# Patient Record
Sex: Female | Born: 1940
Health system: Southern US, Community
[De-identification: ages and names within clinical notes are randomized; demographics above are authoritative.]

## PROBLEM LIST (undated history)

## (undated) DIAGNOSIS — G629 Polyneuropathy, unspecified: Secondary | ICD-10-CM

## (undated) DIAGNOSIS — J329 Chronic sinusitis, unspecified: Secondary | ICD-10-CM

## (undated) DIAGNOSIS — M1990 Unspecified osteoarthritis, unspecified site: Secondary | ICD-10-CM

## (undated) DIAGNOSIS — N08 Glomerular disorders in diseases classified elsewhere: Secondary | ICD-10-CM

## (undated) DIAGNOSIS — N390 Urinary tract infection, site not specified: Secondary | ICD-10-CM

## (undated) DIAGNOSIS — F32A Depression, unspecified: Secondary | ICD-10-CM

## (undated) DIAGNOSIS — J302 Other seasonal allergic rhinitis: Secondary | ICD-10-CM

## (undated) DIAGNOSIS — F419 Anxiety disorder, unspecified: Secondary | ICD-10-CM

## (undated) DIAGNOSIS — Z8673 Personal history of transient ischemic attack (TIA), and cerebral infarction without residual deficits: Secondary | ICD-10-CM

## (undated) DIAGNOSIS — I10 Essential (primary) hypertension: Secondary | ICD-10-CM

## (undated) DIAGNOSIS — F19951 Other psychoactive substance use, unspecified with psychoactive substance-induced psychotic disorder with hallucinations: Secondary | ICD-10-CM

## (undated) DIAGNOSIS — N183 Chronic kidney disease, stage 3 (moderate): Secondary | ICD-10-CM

## (undated) DIAGNOSIS — R110 Nausea: Secondary | ICD-10-CM

## (undated) DIAGNOSIS — R634 Abnormal weight loss: Secondary | ICD-10-CM

## (undated) DIAGNOSIS — J40 Bronchitis, not specified as acute or chronic: Secondary | ICD-10-CM

## (undated) DIAGNOSIS — R300 Dysuria: Secondary | ICD-10-CM

## (undated) DIAGNOSIS — B20 Human immunodeficiency virus [HIV] disease: Secondary | ICD-10-CM

## (undated) DIAGNOSIS — Z21 Asymptomatic human immunodeficiency virus [HIV] infection status: Secondary | ICD-10-CM

## (undated) DIAGNOSIS — T8859XA Other complications of anesthesia, initial encounter: Secondary | ICD-10-CM

## (undated) DIAGNOSIS — R945 Abnormal results of liver function studies: Secondary | ICD-10-CM

## (undated) DIAGNOSIS — F329 Major depressive disorder, single episode, unspecified: Secondary | ICD-10-CM

## (undated) DIAGNOSIS — J449 Chronic obstructive pulmonary disease, unspecified: Secondary | ICD-10-CM

## (undated) HISTORY — PX: BACK SURGERY: SHX140

## (undated) HISTORY — DX: Chronic kidney disease, stage 3 (moderate): N18.3

## (undated) HISTORY — PX: CHOLECYSTECTOMY: SHX55

## (undated) HISTORY — DX: Abnormal results of liver function studies: R94.5

## (undated) HISTORY — DX: Abnormal weight loss: R63.4

## (undated) HISTORY — DX: Urinary tract infection, site not specified: N39.0

## (undated) HISTORY — PX: TUBAL LIGATION: SHX77

## (undated) HISTORY — DX: Chronic sinusitis, unspecified: J32.9

## (undated) HISTORY — DX: Nausea: R11.0

## (undated) HISTORY — PX: CATARACT EXTRACTION, BILATERAL: SHX1313

---
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
## 1979-10-27 DIAGNOSIS — Z8673 Personal history of transient ischemic attack (TIA), and cerebral infarction without residual deficits: Secondary | ICD-10-CM

## 1979-10-27 HISTORY — DX: Personal history of transient ischemic attack (TIA), and cerebral infarction without residual deficits: Z86.73

## 1983-10-27 HISTORY — PX: ABDOMINAL HYSTERECTOMY: SHX81

## 1997-05-24 ENCOUNTER — Encounter (INDEPENDENT_AMBULATORY_CARE_PROVIDER_SITE_OTHER): Payer: Self-pay | Admitting: *Deleted

## 1997-05-24 LAB — CONVERTED CEMR LAB
CD4 Count: 33 microliters
CD4 T Cell Abs: 33

## 1999-01-22 ENCOUNTER — Ambulatory Visit (HOSPITAL_COMMUNITY): Admission: RE | Admit: 1999-01-22 | Discharge: 1999-01-22 | Payer: Self-pay | Admitting: Infectious Diseases

## 1999-01-22 ENCOUNTER — Encounter: Admission: RE | Admit: 1999-01-22 | Discharge: 1999-01-22 | Payer: Self-pay | Admitting: Infectious Diseases

## 1999-02-17 ENCOUNTER — Encounter: Admission: RE | Admit: 1999-02-17 | Discharge: 1999-02-17 | Payer: Self-pay | Admitting: Infectious Diseases

## 1999-04-21 ENCOUNTER — Encounter: Admission: RE | Admit: 1999-04-21 | Discharge: 1999-04-21 | Payer: Self-pay | Admitting: Infectious Diseases

## 1999-04-21 ENCOUNTER — Ambulatory Visit (HOSPITAL_COMMUNITY): Admission: RE | Admit: 1999-04-21 | Discharge: 1999-04-21 | Payer: Self-pay | Admitting: Infectious Diseases

## 1999-05-08 ENCOUNTER — Encounter: Payer: Self-pay | Admitting: Infectious Diseases

## 1999-05-08 ENCOUNTER — Ambulatory Visit (HOSPITAL_COMMUNITY): Admission: RE | Admit: 1999-05-08 | Discharge: 1999-05-08 | Payer: Self-pay | Admitting: Infectious Diseases

## 1999-06-02 ENCOUNTER — Encounter: Admission: RE | Admit: 1999-06-02 | Discharge: 1999-06-02 | Payer: Self-pay | Admitting: Infectious Diseases

## 1999-07-21 ENCOUNTER — Encounter: Admission: RE | Admit: 1999-07-21 | Discharge: 1999-07-21 | Payer: Self-pay | Admitting: Internal Medicine

## 1999-07-21 ENCOUNTER — Ambulatory Visit (HOSPITAL_COMMUNITY): Admission: RE | Admit: 1999-07-21 | Discharge: 1999-07-21 | Payer: Self-pay | Admitting: Infectious Diseases

## 1999-07-21 ENCOUNTER — Encounter: Admission: RE | Admit: 1999-07-21 | Discharge: 1999-07-21 | Payer: Self-pay | Admitting: Infectious Diseases

## 1999-08-04 ENCOUNTER — Encounter: Admission: RE | Admit: 1999-08-04 | Discharge: 1999-08-04 | Payer: Self-pay | Admitting: Infectious Diseases

## 1999-10-14 ENCOUNTER — Encounter: Admission: RE | Admit: 1999-10-14 | Discharge: 1999-10-14 | Payer: Self-pay | Admitting: Infectious Diseases

## 1999-10-14 ENCOUNTER — Ambulatory Visit (HOSPITAL_COMMUNITY): Admission: RE | Admit: 1999-10-14 | Discharge: 1999-10-14 | Payer: Self-pay | Admitting: Infectious Diseases

## 1999-11-19 ENCOUNTER — Encounter: Admission: RE | Admit: 1999-11-19 | Discharge: 1999-11-19 | Payer: Self-pay | Admitting: Infectious Diseases

## 1999-11-19 ENCOUNTER — Ambulatory Visit (HOSPITAL_COMMUNITY): Admission: RE | Admit: 1999-11-19 | Discharge: 1999-11-19 | Payer: Self-pay | Admitting: Infectious Diseases

## 1999-12-03 ENCOUNTER — Encounter: Admission: RE | Admit: 1999-12-03 | Discharge: 1999-12-03 | Payer: Self-pay | Admitting: Infectious Diseases

## 2000-01-05 ENCOUNTER — Encounter: Admission: RE | Admit: 2000-01-05 | Discharge: 2000-01-05 | Payer: Self-pay | Admitting: Internal Medicine

## 2000-01-05 ENCOUNTER — Ambulatory Visit (HOSPITAL_COMMUNITY): Admission: RE | Admit: 2000-01-05 | Discharge: 2000-01-05 | Payer: Self-pay | Admitting: Infectious Diseases

## 2000-02-11 ENCOUNTER — Encounter: Admission: RE | Admit: 2000-02-11 | Discharge: 2000-02-11 | Payer: Self-pay | Admitting: Infectious Diseases

## 2000-02-11 ENCOUNTER — Ambulatory Visit (HOSPITAL_COMMUNITY): Admission: RE | Admit: 2000-02-11 | Discharge: 2000-02-11 | Payer: Self-pay | Admitting: Infectious Diseases

## 2000-05-03 ENCOUNTER — Encounter: Admission: RE | Admit: 2000-05-03 | Discharge: 2000-05-03 | Payer: Self-pay | Admitting: Infectious Diseases

## 2000-05-03 ENCOUNTER — Ambulatory Visit (HOSPITAL_COMMUNITY): Admission: RE | Admit: 2000-05-03 | Discharge: 2000-05-03 | Payer: Self-pay | Admitting: Infectious Diseases

## 2000-05-17 ENCOUNTER — Encounter: Admission: RE | Admit: 2000-05-17 | Discharge: 2000-05-17 | Payer: Self-pay | Admitting: Infectious Diseases

## 2000-07-01 ENCOUNTER — Ambulatory Visit (HOSPITAL_COMMUNITY): Admission: RE | Admit: 2000-07-01 | Discharge: 2000-07-01 | Payer: Self-pay | Admitting: Infectious Diseases

## 2000-07-01 ENCOUNTER — Encounter: Admission: RE | Admit: 2000-07-01 | Discharge: 2000-07-01 | Payer: Self-pay | Admitting: Infectious Diseases

## 2000-07-14 ENCOUNTER — Encounter: Admission: RE | Admit: 2000-07-14 | Discharge: 2000-07-14 | Payer: Self-pay | Admitting: Infectious Diseases

## 2000-08-31 ENCOUNTER — Encounter: Admission: RE | Admit: 2000-08-31 | Discharge: 2000-08-31 | Payer: Self-pay | Admitting: Infectious Diseases

## 2000-08-31 ENCOUNTER — Ambulatory Visit (HOSPITAL_COMMUNITY): Admission: RE | Admit: 2000-08-31 | Discharge: 2000-08-31 | Payer: Self-pay | Admitting: Infectious Diseases

## 2000-09-13 ENCOUNTER — Encounter: Admission: RE | Admit: 2000-09-13 | Discharge: 2000-09-13 | Payer: Self-pay | Admitting: Infectious Diseases

## 2000-11-01 ENCOUNTER — Encounter: Admission: RE | Admit: 2000-11-01 | Discharge: 2000-11-01 | Payer: Self-pay | Admitting: Infectious Diseases

## 2000-11-01 ENCOUNTER — Ambulatory Visit (HOSPITAL_COMMUNITY): Admission: RE | Admit: 2000-11-01 | Discharge: 2000-11-01 | Payer: Self-pay | Admitting: Infectious Diseases

## 2000-12-29 ENCOUNTER — Encounter: Admission: RE | Admit: 2000-12-29 | Discharge: 2000-12-29 | Payer: Self-pay | Admitting: Infectious Diseases

## 2000-12-29 ENCOUNTER — Ambulatory Visit (HOSPITAL_COMMUNITY): Admission: RE | Admit: 2000-12-29 | Discharge: 2000-12-29 | Payer: Self-pay | Admitting: Infectious Diseases

## 2001-01-17 ENCOUNTER — Encounter: Admission: RE | Admit: 2001-01-17 | Discharge: 2001-01-17 | Payer: Self-pay | Admitting: Infectious Diseases

## 2001-05-03 ENCOUNTER — Ambulatory Visit (HOSPITAL_COMMUNITY): Admission: RE | Admit: 2001-05-03 | Discharge: 2001-05-03 | Payer: Self-pay | Admitting: Infectious Diseases

## 2001-05-03 ENCOUNTER — Encounter: Admission: RE | Admit: 2001-05-03 | Discharge: 2001-05-03 | Payer: Self-pay | Admitting: Infectious Diseases

## 2001-05-17 ENCOUNTER — Encounter: Admission: RE | Admit: 2001-05-17 | Discharge: 2001-05-17 | Payer: Self-pay | Admitting: Internal Medicine

## 2001-08-18 ENCOUNTER — Encounter: Admission: RE | Admit: 2001-08-18 | Discharge: 2001-08-18 | Payer: Self-pay | Admitting: Infectious Diseases

## 2001-08-18 ENCOUNTER — Ambulatory Visit (HOSPITAL_COMMUNITY): Admission: RE | Admit: 2001-08-18 | Discharge: 2001-08-18 | Payer: Self-pay | Admitting: Infectious Diseases

## 2001-12-05 ENCOUNTER — Encounter: Admission: RE | Admit: 2001-12-05 | Discharge: 2001-12-05 | Payer: Self-pay | Admitting: Infectious Diseases

## 2001-12-05 ENCOUNTER — Ambulatory Visit (HOSPITAL_COMMUNITY): Admission: RE | Admit: 2001-12-05 | Discharge: 2001-12-05 | Payer: Self-pay | Admitting: Infectious Diseases

## 2001-12-26 ENCOUNTER — Encounter: Admission: RE | Admit: 2001-12-26 | Discharge: 2001-12-26 | Payer: Self-pay | Admitting: Infectious Diseases

## 2002-05-03 ENCOUNTER — Encounter: Admission: RE | Admit: 2002-05-03 | Discharge: 2002-05-03 | Payer: Self-pay | Admitting: Internal Medicine

## 2002-05-03 ENCOUNTER — Ambulatory Visit (HOSPITAL_COMMUNITY): Admission: RE | Admit: 2002-05-03 | Discharge: 2002-05-03 | Payer: Self-pay | Admitting: Infectious Diseases

## 2002-06-19 ENCOUNTER — Encounter: Admission: RE | Admit: 2002-06-19 | Discharge: 2002-06-19 | Payer: Self-pay | Admitting: Infectious Diseases

## 2002-07-19 ENCOUNTER — Encounter: Admission: RE | Admit: 2002-07-19 | Discharge: 2002-07-19 | Payer: Self-pay | Admitting: Infectious Diseases

## 2002-07-19 ENCOUNTER — Ambulatory Visit (HOSPITAL_COMMUNITY): Admission: RE | Admit: 2002-07-19 | Discharge: 2002-07-19 | Payer: Self-pay | Admitting: Infectious Diseases

## 2002-08-08 ENCOUNTER — Encounter: Admission: RE | Admit: 2002-08-08 | Discharge: 2002-08-08 | Payer: Self-pay | Admitting: Infectious Diseases

## 2002-08-10 ENCOUNTER — Ambulatory Visit (HOSPITAL_COMMUNITY): Admission: RE | Admit: 2002-08-10 | Discharge: 2002-08-10 | Payer: Self-pay | Admitting: Internal Medicine

## 2002-08-10 HISTORY — PX: COLONOSCOPY: SHX174

## 2002-08-10 HISTORY — PX: ESOPHAGOGASTRODUODENOSCOPY: SHX1529

## 2002-09-11 ENCOUNTER — Encounter: Admission: RE | Admit: 2002-09-11 | Discharge: 2002-09-11 | Payer: Self-pay | Admitting: Infectious Diseases

## 2002-10-13 ENCOUNTER — Ambulatory Visit (HOSPITAL_COMMUNITY): Admission: RE | Admit: 2002-10-13 | Discharge: 2002-10-13 | Payer: Self-pay | Admitting: Infectious Diseases

## 2002-10-13 ENCOUNTER — Encounter: Admission: RE | Admit: 2002-10-13 | Discharge: 2002-10-13 | Payer: Self-pay | Admitting: Infectious Diseases

## 2002-10-30 ENCOUNTER — Encounter: Admission: RE | Admit: 2002-10-30 | Discharge: 2002-10-30 | Payer: Self-pay | Admitting: Infectious Diseases

## 2002-12-25 ENCOUNTER — Ambulatory Visit (HOSPITAL_COMMUNITY): Admission: RE | Admit: 2002-12-25 | Discharge: 2002-12-25 | Payer: Self-pay | Admitting: Internal Medicine

## 2002-12-25 HISTORY — PX: ESOPHAGOGASTRODUODENOSCOPY: SHX1529

## 2003-01-08 ENCOUNTER — Ambulatory Visit (HOSPITAL_COMMUNITY): Admission: RE | Admit: 2003-01-08 | Discharge: 2003-01-08 | Payer: Self-pay | Admitting: Ophthalmology

## 2003-01-10 ENCOUNTER — Emergency Department (HOSPITAL_COMMUNITY): Admission: EM | Admit: 2003-01-10 | Discharge: 2003-01-10 | Payer: Self-pay | Admitting: Internal Medicine

## 2003-01-10 ENCOUNTER — Encounter: Payer: Self-pay | Admitting: Internal Medicine

## 2003-01-30 ENCOUNTER — Encounter: Admission: RE | Admit: 2003-01-30 | Discharge: 2003-01-30 | Payer: Self-pay | Admitting: Infectious Diseases

## 2003-02-12 ENCOUNTER — Encounter: Admission: RE | Admit: 2003-02-12 | Discharge: 2003-02-12 | Payer: Self-pay | Admitting: Infectious Diseases

## 2003-03-26 ENCOUNTER — Encounter: Admission: RE | Admit: 2003-03-26 | Discharge: 2003-03-26 | Payer: Self-pay | Admitting: Infectious Diseases

## 2003-06-25 ENCOUNTER — Ambulatory Visit (HOSPITAL_COMMUNITY): Admission: RE | Admit: 2003-06-25 | Discharge: 2003-06-25 | Payer: Self-pay | Admitting: Infectious Diseases

## 2003-06-25 ENCOUNTER — Encounter: Admission: RE | Admit: 2003-06-25 | Discharge: 2003-06-25 | Payer: Self-pay | Admitting: Infectious Diseases

## 2003-06-25 ENCOUNTER — Encounter (INDEPENDENT_AMBULATORY_CARE_PROVIDER_SITE_OTHER): Payer: Self-pay | Admitting: Infectious Diseases

## 2003-07-23 ENCOUNTER — Encounter: Admission: RE | Admit: 2003-07-23 | Discharge: 2003-07-23 | Payer: Self-pay | Admitting: Infectious Diseases

## 2003-08-15 ENCOUNTER — Encounter: Admission: RE | Admit: 2003-08-15 | Discharge: 2003-08-15 | Payer: Self-pay | Admitting: Infectious Diseases

## 2003-09-24 ENCOUNTER — Encounter (INDEPENDENT_AMBULATORY_CARE_PROVIDER_SITE_OTHER): Payer: Self-pay | Admitting: Infectious Diseases

## 2003-09-24 ENCOUNTER — Ambulatory Visit (HOSPITAL_COMMUNITY): Admission: RE | Admit: 2003-09-24 | Discharge: 2003-09-24 | Payer: Self-pay | Admitting: Infectious Diseases

## 2003-09-24 ENCOUNTER — Encounter: Admission: RE | Admit: 2003-09-24 | Discharge: 2003-09-24 | Payer: Self-pay | Admitting: Infectious Diseases

## 2003-11-23 ENCOUNTER — Emergency Department (HOSPITAL_COMMUNITY): Admission: EM | Admit: 2003-11-23 | Discharge: 2003-11-23 | Payer: Self-pay

## 2003-12-10 ENCOUNTER — Ambulatory Visit (HOSPITAL_COMMUNITY): Admission: RE | Admit: 2003-12-10 | Discharge: 2003-12-10 | Payer: Self-pay | Admitting: Infectious Diseases

## 2003-12-10 ENCOUNTER — Encounter: Admission: RE | Admit: 2003-12-10 | Discharge: 2003-12-10 | Payer: Self-pay | Admitting: Infectious Diseases

## 2004-02-19 ENCOUNTER — Encounter: Admission: RE | Admit: 2004-02-19 | Discharge: 2004-02-19 | Payer: Self-pay | Admitting: Infectious Diseases

## 2004-02-19 ENCOUNTER — Ambulatory Visit (HOSPITAL_COMMUNITY): Admission: RE | Admit: 2004-02-19 | Discharge: 2004-02-19 | Payer: Self-pay | Admitting: Infectious Diseases

## 2004-03-26 ENCOUNTER — Encounter: Admission: RE | Admit: 2004-03-26 | Discharge: 2004-03-26 | Payer: Self-pay | Admitting: Infectious Diseases

## 2004-06-18 ENCOUNTER — Ambulatory Visit (HOSPITAL_COMMUNITY): Admission: RE | Admit: 2004-06-18 | Discharge: 2004-06-18 | Payer: Self-pay | Admitting: Infectious Diseases

## 2004-06-18 ENCOUNTER — Encounter: Admission: RE | Admit: 2004-06-18 | Discharge: 2004-06-18 | Payer: Self-pay | Admitting: Internal Medicine

## 2004-07-16 ENCOUNTER — Inpatient Hospital Stay (HOSPITAL_COMMUNITY): Admission: RE | Admit: 2004-07-16 | Discharge: 2004-07-18 | Payer: Self-pay | Admitting: Neurosurgery

## 2004-07-16 ENCOUNTER — Ambulatory Visit: Payer: Self-pay | Admitting: Infectious Diseases

## 2004-09-22 ENCOUNTER — Ambulatory Visit: Payer: Self-pay | Admitting: Infectious Diseases

## 2004-09-22 ENCOUNTER — Ambulatory Visit (HOSPITAL_COMMUNITY): Admission: RE | Admit: 2004-09-22 | Discharge: 2004-09-22 | Payer: Self-pay | Admitting: Infectious Diseases

## 2004-11-01 ENCOUNTER — Observation Stay (HOSPITAL_COMMUNITY): Admission: EM | Admit: 2004-11-01 | Discharge: 2004-11-02 | Payer: Self-pay | Admitting: Emergency Medicine

## 2004-11-21 ENCOUNTER — Emergency Department (HOSPITAL_COMMUNITY): Admission: EM | Admit: 2004-11-21 | Discharge: 2004-11-21 | Payer: Self-pay | Admitting: Emergency Medicine

## 2004-12-03 ENCOUNTER — Ambulatory Visit: Payer: Self-pay | Admitting: Infectious Diseases

## 2004-12-03 ENCOUNTER — Ambulatory Visit (HOSPITAL_COMMUNITY): Admission: RE | Admit: 2004-12-03 | Discharge: 2004-12-03 | Payer: Self-pay | Admitting: Infectious Diseases

## 2004-12-12 ENCOUNTER — Emergency Department (HOSPITAL_COMMUNITY): Admission: EM | Admit: 2004-12-12 | Discharge: 2004-12-12 | Payer: Self-pay | Admitting: Emergency Medicine

## 2005-02-09 ENCOUNTER — Ambulatory Visit: Payer: Self-pay | Admitting: Infectious Diseases

## 2005-03-26 ENCOUNTER — Emergency Department (HOSPITAL_COMMUNITY): Admission: EM | Admit: 2005-03-26 | Discharge: 2005-03-26 | Payer: Self-pay | Admitting: Family Medicine

## 2005-03-26 ENCOUNTER — Ambulatory Visit: Payer: Self-pay | Admitting: Infectious Diseases

## 2005-03-26 ENCOUNTER — Ambulatory Visit (HOSPITAL_COMMUNITY): Admission: RE | Admit: 2005-03-26 | Discharge: 2005-03-26 | Payer: Self-pay | Admitting: Infectious Diseases

## 2005-03-31 ENCOUNTER — Ambulatory Visit: Payer: Self-pay | Admitting: Infectious Diseases

## 2005-04-21 ENCOUNTER — Ambulatory Visit: Payer: Self-pay | Admitting: Infectious Diseases

## 2005-04-21 ENCOUNTER — Ambulatory Visit (HOSPITAL_COMMUNITY): Admission: RE | Admit: 2005-04-21 | Discharge: 2005-04-21 | Payer: Self-pay | Admitting: Infectious Diseases

## 2005-05-11 ENCOUNTER — Ambulatory Visit: Payer: Self-pay | Admitting: Infectious Diseases

## 2005-08-17 ENCOUNTER — Ambulatory Visit: Payer: Self-pay | Admitting: Infectious Diseases

## 2005-08-17 ENCOUNTER — Ambulatory Visit (HOSPITAL_COMMUNITY): Admission: RE | Admit: 2005-08-17 | Discharge: 2005-08-17 | Payer: Self-pay | Admitting: Infectious Diseases

## 2005-12-28 ENCOUNTER — Ambulatory Visit: Payer: Self-pay | Admitting: Infectious Diseases

## 2005-12-28 ENCOUNTER — Encounter: Admission: RE | Admit: 2005-12-28 | Discharge: 2005-12-28 | Payer: Self-pay | Admitting: Infectious Diseases

## 2006-01-11 ENCOUNTER — Ambulatory Visit: Payer: Self-pay | Admitting: Infectious Diseases

## 2006-05-19 ENCOUNTER — Ambulatory Visit: Payer: Self-pay | Admitting: Infectious Diseases

## 2006-05-19 ENCOUNTER — Encounter: Admission: RE | Admit: 2006-05-19 | Discharge: 2006-05-19 | Payer: Self-pay | Admitting: Infectious Diseases

## 2006-05-19 ENCOUNTER — Encounter (INDEPENDENT_AMBULATORY_CARE_PROVIDER_SITE_OTHER): Payer: Self-pay | Admitting: *Deleted

## 2006-07-14 ENCOUNTER — Encounter: Admission: RE | Admit: 2006-07-14 | Discharge: 2006-07-14 | Payer: Self-pay | Admitting: Infectious Diseases

## 2006-07-14 ENCOUNTER — Ambulatory Visit: Payer: Self-pay | Admitting: Infectious Diseases

## 2006-07-14 ENCOUNTER — Encounter (INDEPENDENT_AMBULATORY_CARE_PROVIDER_SITE_OTHER): Payer: Self-pay | Admitting: *Deleted

## 2006-07-14 LAB — CONVERTED CEMR LAB
CD4 Count: 610 microliters
HIV 1 RNA Quant: 49 copies/mL

## 2006-08-09 ENCOUNTER — Ambulatory Visit: Payer: Self-pay | Admitting: Infectious Diseases

## 2006-08-09 LAB — CONVERTED CEMR LAB
Alkaline Phosphatase: 75 units/L (ref 39–117)
BUN: 38 mg/dL — ABNORMAL HIGH (ref 6–23)
Bilirubin Urine: NEGATIVE
Eosinophils Absolute: 0.1 cells/mcL (ref 0.0–0.7)
Eosinophils Relative: 2 % (ref 0–5)
Glucose, Bld: 118 mg/dL — ABNORMAL HIGH (ref 70–99)
HCT: 22.4 % — ABNORMAL LOW (ref 36.0–46.0)
Leukocyte count, blood: 6.4 10*9/L (ref 4.0–10.5)
Lymphs Abs: 1.4 10*3/uL (ref 0.7–3.3)
MCV: 101.3 fL — ABNORMAL HIGH (ref 78.0–100.0)
Platelets: 325 10*3/uL (ref 150–400)
Sodium: 144 meq/L (ref 135–145)
Specific Gravity, Urine: 1.01 (ref 1.005–1.03)
Total Bilirubin: 1.2 mg/dL (ref 0.3–1.2)
Total Protein: 6.2 g/dL (ref 6.0–8.3)
Urine Glucose: 250 mg/dL — AB

## 2006-08-10 ENCOUNTER — Encounter (INDEPENDENT_AMBULATORY_CARE_PROVIDER_SITE_OTHER): Payer: Self-pay | Admitting: *Deleted

## 2006-08-10 ENCOUNTER — Inpatient Hospital Stay (HOSPITAL_COMMUNITY): Admission: AD | Admit: 2006-08-10 | Discharge: 2006-08-17 | Payer: Self-pay | Admitting: Internal Medicine

## 2006-08-10 ENCOUNTER — Ambulatory Visit: Payer: Self-pay | Admitting: Internal Medicine

## 2006-08-10 ENCOUNTER — Ambulatory Visit: Payer: Self-pay | Admitting: Hospitalist

## 2006-08-11 ENCOUNTER — Encounter (INDEPENDENT_AMBULATORY_CARE_PROVIDER_SITE_OTHER): Payer: Self-pay | Admitting: *Deleted

## 2006-08-11 ENCOUNTER — Ambulatory Visit: Payer: Self-pay | Admitting: Cardiology

## 2006-08-11 ENCOUNTER — Encounter: Payer: Self-pay | Admitting: Cardiology

## 2006-08-12 ENCOUNTER — Encounter (INDEPENDENT_AMBULATORY_CARE_PROVIDER_SITE_OTHER): Payer: Self-pay | Admitting: Specialist

## 2006-08-23 ENCOUNTER — Ambulatory Visit: Payer: Self-pay | Admitting: Infectious Diseases

## 2006-08-27 DIAGNOSIS — B20 Human immunodeficiency virus [HIV] disease: Secondary | ICD-10-CM | POA: Insufficient documentation

## 2006-08-27 DIAGNOSIS — Z21 Asymptomatic human immunodeficiency virus [HIV] infection status: Secondary | ICD-10-CM | POA: Insufficient documentation

## 2006-09-20 ENCOUNTER — Encounter (HOSPITAL_COMMUNITY): Admission: RE | Admit: 2006-09-20 | Discharge: 2006-12-19 | Payer: Self-pay | Admitting: Nephrology

## 2006-09-23 ENCOUNTER — Inpatient Hospital Stay (HOSPITAL_COMMUNITY): Admission: EM | Admit: 2006-09-23 | Discharge: 2006-09-27 | Payer: Self-pay | Admitting: Emergency Medicine

## 2006-09-23 ENCOUNTER — Ambulatory Visit: Payer: Self-pay | Admitting: Infectious Diseases

## 2006-09-23 ENCOUNTER — Ambulatory Visit: Payer: Self-pay | Admitting: Internal Medicine

## 2006-09-27 ENCOUNTER — Ambulatory Visit: Payer: Self-pay | Admitting: Oncology

## 2006-09-29 ENCOUNTER — Encounter: Admission: RE | Admit: 2006-09-29 | Discharge: 2006-09-29 | Payer: Self-pay | Admitting: Infectious Diseases

## 2006-09-29 ENCOUNTER — Encounter (INDEPENDENT_AMBULATORY_CARE_PROVIDER_SITE_OTHER): Payer: Self-pay | Admitting: *Deleted

## 2006-09-29 ENCOUNTER — Ambulatory Visit: Payer: Self-pay | Admitting: Infectious Diseases

## 2006-09-29 LAB — CONVERTED CEMR LAB
ALT: 117 units/L — ABNORMAL HIGH (ref 0–35)
AST: 97 units/L — ABNORMAL HIGH (ref 0–37)
Albumin: 4.3 g/dL (ref 3.5–5.2)
BUN: 34 mg/dL — ABNORMAL HIGH (ref 6–23)
Basophils Relative: 1 % (ref 0–1)
CD4 Count: 520 microliters
Calcium: 9 mg/dL (ref 8.4–10.5)
Chloride: 107 meq/L (ref 96–112)
Glucose, Bld: 91 mg/dL (ref 70–99)
HIV 1 RNA Quant: 44800 copies/mL
Lymphocytes Relative: 59 % — ABNORMAL HIGH (ref 15–43)
Lymphs Abs: 4.6 10*3/uL — ABNORMAL HIGH (ref 0.8–3.1)
MCHC: 33.2 g/dL (ref 33.1–35.4)
Neutrophils Relative %: 24 % — ABNORMAL LOW (ref 47–77)
Platelets: 268 10*3/uL (ref 152–374)
Potassium: 4.2 meq/L (ref 3.5–5.3)
Sodium: 140 meq/L (ref 135–145)
Total Bilirubin: 0.8 mg/dL (ref 0.3–1.2)
Total Protein: 6.7 g/dL (ref 6.0–8.3)
WBC: 7.8 10*3/uL (ref 3.7–10.0)

## 2006-10-06 ENCOUNTER — Ambulatory Visit: Payer: Self-pay | Admitting: Infectious Diseases

## 2006-10-06 LAB — CONVERTED CEMR LAB
BUN: 21 mg/dL (ref 6–23)
Basophils Absolute: 0 10*3/uL (ref 0.0–0.1)
Basophils Relative: 1 % (ref 0–1)
Chloride: 110 meq/L (ref 96–112)
Lymphs Abs: 5 10*3/uL — ABNORMAL HIGH (ref 0.8–3.1)
MCV: 96.8 fL (ref 78.8–100.0)
Neutrophils Relative %: 26 % — ABNORMAL LOW (ref 47–77)
Platelets: 277 10*3/uL (ref 152–374)
Potassium: 4 meq/L (ref 3.5–5.3)
RDW: 14.1 % (ref 11.5–15.3)
Sodium: 136 meq/L (ref 135–145)
WBC: 8.5 10*3/uL (ref 3.7–10.0)

## 2006-10-29 ENCOUNTER — Ambulatory Visit: Payer: Self-pay | Admitting: Internal Medicine

## 2006-10-29 LAB — CONVERTED CEMR LAB
HCT: 29.9 % — ABNORMAL LOW (ref 36.0–46.0)
Platelets: 200 10*3/uL (ref 150–400)
RDW: 14 % (ref 11.5–14.0)
WBC: 7.7 10*3/uL (ref 4.0–10.5)

## 2006-11-01 ENCOUNTER — Encounter (INDEPENDENT_AMBULATORY_CARE_PROVIDER_SITE_OTHER): Payer: Self-pay | Admitting: *Deleted

## 2006-11-01 ENCOUNTER — Ambulatory Visit: Payer: Self-pay | Admitting: Infectious Diseases

## 2006-11-01 ENCOUNTER — Encounter: Admission: RE | Admit: 2006-11-01 | Discharge: 2006-11-01 | Payer: Self-pay | Admitting: Infectious Diseases

## 2006-11-01 LAB — CONVERTED CEMR LAB
ALT: 21 units/L (ref 0–35)
AST: 25 units/L (ref 0–37)
BUN: 27 mg/dL — ABNORMAL HIGH (ref 6–23)
Basophils Absolute: 0 10*3/uL (ref 0.0–0.1)
Basophils Relative: 1 % (ref 0–1)
Calcium: 9 mg/dL (ref 8.4–10.5)
Creatinine, Ser: 2.74 mg/dL — ABNORMAL HIGH (ref 0.40–1.20)
Eosinophils Relative: 3 % (ref 0–5)
HCT: 32.9 % — ABNORMAL LOW (ref 36.0–46.0)
HIV-1 RNA Quant, Log: 4.54 — ABNORMAL HIGH (ref <1.70)
Hemoglobin: 10.5 g/dL — ABNORMAL LOW (ref 12.0–15.0)
Lymphs Abs: 4.2 10*3/uL — ABNORMAL HIGH (ref 0.7–3.3)
MCHC: 31.9 g/dL (ref 30.0–36.0)
MCV: 101.5 fL — ABNORMAL HIGH (ref 78.0–100.0)
Monocytes Absolute: 1 10*3/uL — ABNORMAL HIGH (ref 0.2–0.7)
Monocytes Relative: 12 % — ABNORMAL HIGH (ref 3–11)
RBC: 3.24 M/uL — ABNORMAL LOW (ref 3.87–5.11)
Total Bilirubin: 0.7 mg/dL (ref 0.3–1.2)
WBC: 8.1 10*3/uL (ref 4.0–10.5)

## 2006-11-03 ENCOUNTER — Ambulatory Visit: Payer: Self-pay | Admitting: Infectious Diseases

## 2006-11-12 ENCOUNTER — Inpatient Hospital Stay (HOSPITAL_COMMUNITY): Admission: EM | Admit: 2006-11-12 | Discharge: 2006-11-15 | Payer: Self-pay | Admitting: Emergency Medicine

## 2006-11-12 ENCOUNTER — Ambulatory Visit: Payer: Self-pay | Admitting: Infectious Diseases

## 2006-11-12 ENCOUNTER — Ambulatory Visit: Payer: Self-pay | Admitting: *Deleted

## 2006-11-12 ENCOUNTER — Encounter: Payer: Self-pay | Admitting: Emergency Medicine

## 2006-11-17 ENCOUNTER — Encounter (INDEPENDENT_AMBULATORY_CARE_PROVIDER_SITE_OTHER): Payer: Self-pay | Admitting: Infectious Diseases

## 2006-11-23 ENCOUNTER — Ambulatory Visit: Payer: Self-pay | Admitting: Internal Medicine

## 2006-11-29 ENCOUNTER — Ambulatory Visit: Payer: Self-pay | Admitting: Infectious Diseases

## 2006-11-29 LAB — CONVERTED CEMR LAB
Albumin: 4.4 g/dL (ref 3.5–5.2)
BUN: 26 mg/dL — ABNORMAL HIGH (ref 6–23)
CO2: 23 meq/L (ref 19–32)
Calcium: 9.4 mg/dL (ref 8.4–10.5)
Eosinophils Absolute: 0.1 10*3/uL (ref 0.0–0.7)
Glucose, Bld: 95 mg/dL (ref 70–99)
HCT: 32 % — ABNORMAL LOW (ref 36.0–46.0)
Lymphocytes Relative: 56 % — ABNORMAL HIGH (ref 12–46)
Lymphs Abs: 3.2 10*3/uL (ref 0.7–3.3)
MCV: 102.9 fL — ABNORMAL HIGH (ref 78.0–100.0)
Monocytes Relative: 11 % (ref 3–11)
Neutrophils Relative %: 31 % — ABNORMAL LOW (ref 43–77)
Platelets: 182 10*3/uL (ref 150–400)
Potassium: 3.9 meq/L (ref 3.5–5.3)
RBC: 3.11 M/uL — ABNORMAL LOW (ref 3.87–5.11)
Sodium: 141 meq/L (ref 135–145)
Total Protein: 7 g/dL (ref 6.0–8.3)
WBC: 5.8 10*3/uL (ref 4.0–10.5)

## 2006-12-08 ENCOUNTER — Inpatient Hospital Stay (HOSPITAL_COMMUNITY): Admission: EM | Admit: 2006-12-08 | Discharge: 2006-12-13 | Payer: Self-pay | Admitting: Emergency Medicine

## 2006-12-08 ENCOUNTER — Ambulatory Visit (HOSPITAL_COMMUNITY): Admission: RE | Admit: 2006-12-08 | Discharge: 2006-12-08 | Payer: Self-pay | Admitting: Gastroenterology

## 2006-12-10 ENCOUNTER — Encounter (INDEPENDENT_AMBULATORY_CARE_PROVIDER_SITE_OTHER): Payer: Self-pay | Admitting: *Deleted

## 2006-12-10 HISTORY — PX: COLONOSCOPY: SHX174

## 2006-12-20 ENCOUNTER — Encounter (INDEPENDENT_AMBULATORY_CARE_PROVIDER_SITE_OTHER): Payer: Self-pay | Admitting: *Deleted

## 2006-12-20 LAB — CONVERTED CEMR LAB

## 2006-12-21 ENCOUNTER — Encounter (HOSPITAL_COMMUNITY): Admission: RE | Admit: 2006-12-21 | Discharge: 2007-03-21 | Payer: Self-pay | Admitting: Nephrology

## 2006-12-27 ENCOUNTER — Ambulatory Visit: Payer: Self-pay | Admitting: Infectious Diseases

## 2006-12-27 DIAGNOSIS — N189 Chronic kidney disease, unspecified: Secondary | ICD-10-CM | POA: Insufficient documentation

## 2006-12-27 DIAGNOSIS — K559 Vascular disorder of intestine, unspecified: Secondary | ICD-10-CM

## 2006-12-27 DIAGNOSIS — N179 Acute kidney failure, unspecified: Secondary | ICD-10-CM | POA: Insufficient documentation

## 2006-12-27 DIAGNOSIS — N19 Unspecified kidney failure: Secondary | ICD-10-CM

## 2006-12-27 HISTORY — DX: Vascular disorder of intestine, unspecified: K55.9

## 2006-12-30 ENCOUNTER — Telehealth (INDEPENDENT_AMBULATORY_CARE_PROVIDER_SITE_OTHER): Payer: Self-pay | Admitting: Infectious Diseases

## 2007-01-02 ENCOUNTER — Encounter (INDEPENDENT_AMBULATORY_CARE_PROVIDER_SITE_OTHER): Payer: Self-pay | Admitting: *Deleted

## 2007-01-04 ENCOUNTER — Encounter: Admission: RE | Admit: 2007-01-04 | Discharge: 2007-01-04 | Payer: Self-pay | Admitting: Internal Medicine

## 2007-01-04 ENCOUNTER — Encounter: Payer: Self-pay | Admitting: Internal Medicine

## 2007-01-04 ENCOUNTER — Ambulatory Visit: Payer: Self-pay | Admitting: Infectious Diseases

## 2007-01-05 LAB — CONVERTED CEMR LAB
ALT: 15 units/L (ref 0–35)
AST: 18 units/L (ref 0–37)
Albumin: 4.2 g/dL (ref 3.5–5.2)
BUN: 31 mg/dL — ABNORMAL HIGH (ref 6–23)
Basophils Relative: 1 % (ref 0–1)
CO2: 19 meq/L (ref 19–32)
Calcium: 9.4 mg/dL (ref 8.4–10.5)
Chloride: 106 meq/L (ref 96–112)
Cholesterol: 169 mg/dL (ref 0–200)
Leukocytes, UA: NEGATIVE
Lymphocytes Relative: 49 % — ABNORMAL HIGH (ref 12–46)
MCHC: 32.8 g/dL (ref 30.0–36.0)
Monocytes Relative: 11 % (ref 3–11)
Neutro Abs: 3 10*3/uL (ref 1.7–7.7)
Neutrophils Relative %: 38 % — ABNORMAL LOW (ref 43–77)
Nitrite: NEGATIVE
Potassium: 4.2 meq/L (ref 3.5–5.3)
RBC: 3.56 M/uL — ABNORMAL LOW (ref 3.87–5.11)
Specific Gravity, Urine: 1.019 (ref 1.005–1.03)
WBC, UA: NONE SEEN cells/hpf (ref <3)
WBC: 7.8 10*3/uL (ref 4.0–10.5)
pH: 5.5 (ref 5.0–8.0)

## 2007-01-21 ENCOUNTER — Ambulatory Visit: Payer: Self-pay | Admitting: Internal Medicine

## 2007-02-23 ENCOUNTER — Telehealth: Payer: Self-pay | Admitting: Internal Medicine

## 2007-03-02 ENCOUNTER — Encounter: Admission: RE | Admit: 2007-03-02 | Discharge: 2007-03-02 | Payer: Self-pay | Admitting: Internal Medicine

## 2007-03-02 ENCOUNTER — Ambulatory Visit: Payer: Self-pay | Admitting: Internal Medicine

## 2007-03-02 LAB — CONVERTED CEMR LAB
Albumin: 4.6 g/dL (ref 3.5–5.2)
Alkaline Phosphatase: 83 units/L (ref 39–117)
BUN: 36 mg/dL — ABNORMAL HIGH (ref 6–23)
Eosinophils Absolute: 0.1 10*3/uL (ref 0.0–0.7)
Eosinophils Relative: 2 % (ref 0–5)
Glucose, Bld: 80 mg/dL (ref 70–99)
HCT: 40.6 % (ref 36.0–46.0)
HIV 1 RNA Quant: 62 copies/mL — ABNORMAL HIGH (ref <50)
HIV-1 RNA Quant, Log: 1.79 — ABNORMAL HIGH (ref <1.70)
Hemoglobin: 13.3 g/dL (ref 12.0–15.0)
Lymphs Abs: 4 10*3/uL — ABNORMAL HIGH (ref 0.7–3.3)
MCV: 93.1 fL (ref 78.0–100.0)
Monocytes Relative: 9 % (ref 3–11)
Neutrophils Relative %: 35 % — ABNORMAL LOW (ref 43–77)
Potassium: 4.8 meq/L (ref 3.5–5.3)
RBC: 4.36 M/uL (ref 3.87–5.11)
WBC: 7.3 10*3/uL (ref 4.0–10.5)

## 2007-03-16 ENCOUNTER — Ambulatory Visit: Payer: Self-pay | Admitting: Internal Medicine

## 2007-03-16 ENCOUNTER — Ambulatory Visit (HOSPITAL_COMMUNITY): Admission: RE | Admit: 2007-03-16 | Discharge: 2007-03-16 | Payer: Self-pay | Admitting: Internal Medicine

## 2007-03-16 DIAGNOSIS — B20 Human immunodeficiency virus [HIV] disease: Secondary | ICD-10-CM | POA: Insufficient documentation

## 2007-03-16 DIAGNOSIS — G909 Disorder of the autonomic nervous system, unspecified: Secondary | ICD-10-CM

## 2007-03-16 DIAGNOSIS — M25529 Pain in unspecified elbow: Secondary | ICD-10-CM | POA: Insufficient documentation

## 2007-04-11 ENCOUNTER — Encounter (HOSPITAL_COMMUNITY): Admission: RE | Admit: 2007-04-11 | Discharge: 2007-07-10 | Payer: Self-pay | Admitting: Nephrology

## 2007-04-19 ENCOUNTER — Encounter (INDEPENDENT_AMBULATORY_CARE_PROVIDER_SITE_OTHER): Payer: Self-pay | Admitting: Infectious Diseases

## 2007-05-06 ENCOUNTER — Encounter: Payer: Self-pay | Admitting: Internal Medicine

## 2007-05-11 ENCOUNTER — Ambulatory Visit (HOSPITAL_COMMUNITY): Admission: RE | Admit: 2007-05-11 | Discharge: 2007-05-11 | Payer: Self-pay | Admitting: Gastroenterology

## 2007-06-07 ENCOUNTER — Ambulatory Visit: Payer: Self-pay | Admitting: Internal Medicine

## 2007-06-07 ENCOUNTER — Encounter: Admission: RE | Admit: 2007-06-07 | Discharge: 2007-06-07 | Payer: Self-pay | Admitting: Internal Medicine

## 2007-06-07 LAB — CONVERTED CEMR LAB
ALT: 10 units/L (ref 0–35)
Basophils Absolute: 0 10*3/uL (ref 0.0–0.1)
CO2: 22 meq/L (ref 19–32)
Calcium: 9.1 mg/dL (ref 8.4–10.5)
Chloride: 109 meq/L (ref 96–112)
HIV 1 RNA Quant: 59 copies/mL — ABNORMAL HIGH (ref <50)
Hemoglobin: 12.4 g/dL (ref 12.0–15.0)
Lymphocytes Relative: 43 % (ref 12–46)
Monocytes Absolute: 0.5 10*3/uL (ref 0.2–0.7)
Neutro Abs: 2.7 10*3/uL (ref 1.7–7.7)
Neutrophils Relative %: 47 % (ref 43–77)
Platelets: 229 10*3/uL (ref 150–400)
Potassium: 4.4 meq/L (ref 3.5–5.3)
RDW: 14.2 % — ABNORMAL HIGH (ref 11.5–14.0)
Sodium: 142 meq/L (ref 135–145)
Total Protein: 6.9 g/dL (ref 6.0–8.3)

## 2007-06-21 ENCOUNTER — Ambulatory Visit: Payer: Self-pay | Admitting: Internal Medicine

## 2007-08-02 ENCOUNTER — Telehealth: Payer: Self-pay | Admitting: Internal Medicine

## 2007-08-03 ENCOUNTER — Encounter (HOSPITAL_COMMUNITY): Admission: RE | Admit: 2007-08-03 | Discharge: 2007-09-28 | Payer: Self-pay | Admitting: Nephrology

## 2007-08-04 ENCOUNTER — Telehealth: Payer: Self-pay | Admitting: Internal Medicine

## 2007-08-09 ENCOUNTER — Encounter (INDEPENDENT_AMBULATORY_CARE_PROVIDER_SITE_OTHER): Payer: Self-pay | Admitting: *Deleted

## 2007-08-10 ENCOUNTER — Encounter: Payer: Self-pay | Admitting: Internal Medicine

## 2007-09-20 ENCOUNTER — Encounter: Admission: RE | Admit: 2007-09-20 | Discharge: 2007-09-20 | Payer: Self-pay | Admitting: Internal Medicine

## 2007-09-20 ENCOUNTER — Ambulatory Visit: Payer: Self-pay | Admitting: Internal Medicine

## 2007-09-20 DIAGNOSIS — Z87440 Personal history of urinary (tract) infections: Secondary | ICD-10-CM | POA: Insufficient documentation

## 2007-09-20 LAB — CONVERTED CEMR LAB
ALT: 24 U/L
AST: 22 U/L
Albumin: 4.1 g/dL
Alkaline Phosphatase: 73 U/L
BUN: 17 mg/dL
Basophils Absolute: 0 K/uL
Basophils Relative: 0 %
Bilirubin Urine: NEGATIVE
Bilirubin Urine: NEGATIVE
CO2: 26 meq/L
Calcium: 9.5 mg/dL
Chloride: 106 meq/L
Creatinine, Ser: 1.54 mg/dL — ABNORMAL HIGH
Eosinophils Absolute: 0.1 K/uL — ABNORMAL LOW
Eosinophils Relative: 2 %
Glucose, Bld: 89 mg/dL
HCT: 37.8 %
HIV 1 RNA Quant: <50 {copies}/mL
HIV-1 RNA Quant, Log: <1.7
Hemoglobin, Urine: NEGATIVE
Hemoglobin: 13 g/dL
Ketones, ur: NEGATIVE mg/dL
Lymphocytes Relative: 35 %
Lymphs Abs: 2.7 K/uL
MCHC: 34.4 g/dL
MCV: 97.8 fL
Monocytes Absolute: 0.7 K/uL
Monocytes Relative: 9 %
Neutro Abs: 4.2 K/uL
Neutrophils Relative %: 54 %
Nitrite: NEGATIVE
Platelets: 290 K/uL
Potassium: 3.8 meq/L
Protein, U semiquant: 30
RBC: 3.87 M/uL
RDW: 13 %
Sodium: 139 meq/L
Specific Gravity, Urine: >=1.03
Specific Gravity, Urine: 1.02 (ref 1.005–1.03)
Total Bilirubin: 0.9 mg/dL
Total Protein: 6.8 g/dL
Urine Glucose: NEGATIVE mg/dL
Urobilinogen, UA: 0.2
WBC: 7.9 10*3/microliter
pH: 5
pH: 5.5 (ref 5.0–8.0)

## 2007-09-21 ENCOUNTER — Telehealth: Payer: Self-pay | Admitting: Internal Medicine

## 2007-09-29 ENCOUNTER — Telehealth: Payer: Self-pay | Admitting: Internal Medicine

## 2007-09-29 ENCOUNTER — Encounter (INDEPENDENT_AMBULATORY_CARE_PROVIDER_SITE_OTHER): Payer: Self-pay | Admitting: *Deleted

## 2007-10-05 ENCOUNTER — Ambulatory Visit: Payer: Self-pay | Admitting: Internal Medicine

## 2007-10-05 ENCOUNTER — Telehealth: Payer: Self-pay | Admitting: Internal Medicine

## 2007-10-05 DIAGNOSIS — M5020 Other cervical disc displacement, unspecified cervical region: Secondary | ICD-10-CM

## 2007-10-05 DIAGNOSIS — M5126 Other intervertebral disc displacement, lumbar region: Secondary | ICD-10-CM

## 2007-10-05 DIAGNOSIS — R1032 Left lower quadrant pain: Secondary | ICD-10-CM | POA: Insufficient documentation

## 2007-11-08 ENCOUNTER — Telehealth: Payer: Self-pay | Admitting: Internal Medicine

## 2007-12-19 ENCOUNTER — Ambulatory Visit (HOSPITAL_COMMUNITY): Admission: RE | Admit: 2007-12-19 | Discharge: 2007-12-19 | Payer: Self-pay | Admitting: Gastroenterology

## 2008-01-09 ENCOUNTER — Encounter (HOSPITAL_COMMUNITY): Admission: RE | Admit: 2008-01-09 | Discharge: 2008-02-08 | Payer: Self-pay | Admitting: Nephrology

## 2008-01-20 ENCOUNTER — Encounter: Admission: RE | Admit: 2008-01-20 | Discharge: 2008-01-20 | Payer: Self-pay | Admitting: Internal Medicine

## 2008-01-20 ENCOUNTER — Ambulatory Visit: Payer: Self-pay | Admitting: Internal Medicine

## 2008-01-20 LAB — CONVERTED CEMR LAB
ALT: 15 units/L (ref 0–35)
AST: 14 units/L (ref 0–37)
Albumin: 4.9 g/dL (ref 3.5–5.2)
Alkaline Phosphatase: 79 units/L (ref 39–117)
BUN: 33 mg/dL — ABNORMAL HIGH (ref 6–23)
Basophils Absolute: 0 10*3/uL (ref 0.0–0.1)
Basophils Relative: 0 % (ref 0–1)
Calcium: 9.8 mg/dL (ref 8.4–10.5)
Chloride: 110 meq/L (ref 96–112)
Creatinine, Ser: 1.69 mg/dL — ABNORMAL HIGH (ref 0.40–1.20)
Eosinophils Absolute: 0.2 10*3/uL (ref 0.0–0.7)
MCHC: 33.2 g/dL (ref 30.0–36.0)
MCV: 93.6 fL (ref 78.0–100.0)
Monocytes Relative: 9 % (ref 3–12)
Neutro Abs: 4.4 10*3/uL (ref 1.7–7.7)
Neutrophils Relative %: 56 % (ref 43–77)
Platelets: 228 10*3/uL (ref 150–400)
Potassium: 4.1 meq/L (ref 3.5–5.3)
RBC: 4.22 M/uL (ref 3.87–5.11)
RDW: 12.9 % (ref 11.5–15.5)

## 2008-02-08 ENCOUNTER — Ambulatory Visit: Payer: Self-pay | Admitting: Internal Medicine

## 2008-02-10 ENCOUNTER — Ambulatory Visit (HOSPITAL_COMMUNITY): Admission: RE | Admit: 2008-02-10 | Discharge: 2008-02-10 | Payer: Self-pay | Admitting: Internal Medicine

## 2008-02-14 ENCOUNTER — Telehealth: Payer: Self-pay | Admitting: Internal Medicine

## 2008-02-22 ENCOUNTER — Emergency Department (HOSPITAL_COMMUNITY): Admission: EM | Admit: 2008-02-22 | Discharge: 2008-02-22 | Payer: Self-pay | Admitting: Emergency Medicine

## 2008-02-22 ENCOUNTER — Telehealth: Payer: Self-pay | Admitting: Internal Medicine

## 2008-02-28 ENCOUNTER — Telehealth: Payer: Self-pay | Admitting: Internal Medicine

## 2008-03-26 ENCOUNTER — Ambulatory Visit: Payer: Self-pay | Admitting: Gastroenterology

## 2008-04-11 ENCOUNTER — Telehealth: Payer: Self-pay | Admitting: Internal Medicine

## 2008-04-26 ENCOUNTER — Encounter: Payer: Self-pay | Admitting: Internal Medicine

## 2008-05-08 ENCOUNTER — Encounter: Admission: RE | Admit: 2008-05-08 | Discharge: 2008-05-08 | Payer: Self-pay | Admitting: Internal Medicine

## 2008-05-08 ENCOUNTER — Ambulatory Visit: Payer: Self-pay | Admitting: Internal Medicine

## 2008-05-08 LAB — CONVERTED CEMR LAB
ALT: 11 units/L (ref 0–35)
BUN: 33 mg/dL — ABNORMAL HIGH (ref 6–23)
Basophils Absolute: 0 10*3/uL (ref 0.0–0.1)
CO2: 21 meq/L (ref 19–32)
Calcium: 9.2 mg/dL (ref 8.4–10.5)
Cholesterol: 218 mg/dL — ABNORMAL HIGH (ref 0–200)
Creatinine, Ser: 1.54 mg/dL — ABNORMAL HIGH (ref 0.40–1.20)
Eosinophils Relative: 1 % (ref 0–5)
Glucose, Bld: 92 mg/dL (ref 70–99)
HCT: 37.2 % (ref 36.0–46.0)
HDL: 36 mg/dL — ABNORMAL LOW (ref >39)
Hemoglobin: 12.1 g/dL (ref 12.0–15.0)
Lymphocytes Relative: 27 % (ref 12–46)
Monocytes Absolute: 0.5 10*3/uL (ref 0.1–1.0)
Monocytes Relative: 6 % (ref 3–12)
RBC: 4.02 M/uL (ref 3.87–5.11)
RDW: 12.9 % (ref 11.5–15.5)
Total Bilirubin: 0.5 mg/dL (ref 0.3–1.2)
Total CHOL/HDL Ratio: 6.1
Triglycerides: 171 mg/dL — ABNORMAL HIGH (ref <150)
VLDL: 34 mg/dL (ref 0–40)

## 2008-05-22 ENCOUNTER — Ambulatory Visit: Payer: Self-pay | Admitting: Internal Medicine

## 2008-05-22 DIAGNOSIS — K589 Irritable bowel syndrome without diarrhea: Secondary | ICD-10-CM

## 2008-05-22 DIAGNOSIS — E785 Hyperlipidemia, unspecified: Secondary | ICD-10-CM | POA: Insufficient documentation

## 2008-07-31 ENCOUNTER — Encounter: Payer: Self-pay | Admitting: Internal Medicine

## 2008-08-21 ENCOUNTER — Ambulatory Visit: Payer: Self-pay | Admitting: Internal Medicine

## 2008-08-21 LAB — CONVERTED CEMR LAB
ALT: 16 units/L (ref 0–35)
Alkaline Phosphatase: 70 units/L (ref 39–117)
Basophils Absolute: 0.1 10*3/uL (ref 0.0–0.1)
CO2: 19 meq/L (ref 19–32)
Creatinine, Ser: 1.69 mg/dL — ABNORMAL HIGH (ref 0.40–1.20)
Eosinophils Absolute: 0.2 10*3/uL (ref 0.0–0.7)
Eosinophils Relative: 2 % (ref 0–5)
Glucose, Bld: 88 mg/dL (ref 70–99)
HCT: 41.2 % (ref 36.0–46.0)
HIV 1 RNA Quant: <50 copies/mL (ref <50)
Hemoglobin: 13.7 g/dL (ref 12.0–15.0)
MCV: 92.4 fL (ref 78.0–100.0)
Monocytes Absolute: 0.7 10*3/uL (ref 0.1–1.0)
Platelets: 240 10*3/uL (ref 150–400)
RDW: 13 % (ref 11.5–15.5)
Sodium: 140 meq/L (ref 135–145)
Total Bilirubin: 0.5 mg/dL (ref 0.3–1.2)
Total Protein: 7.3 g/dL (ref 6.0–8.3)

## 2008-09-04 ENCOUNTER — Ambulatory Visit: Payer: Self-pay | Admitting: Internal Medicine

## 2008-09-04 DIAGNOSIS — F329 Major depressive disorder, single episode, unspecified: Secondary | ICD-10-CM

## 2008-10-10 ENCOUNTER — Encounter: Payer: Self-pay | Admitting: Internal Medicine

## 2008-12-03 ENCOUNTER — Ambulatory Visit: Payer: Self-pay | Admitting: Internal Medicine

## 2008-12-03 LAB — CONVERTED CEMR LAB
Albumin: 4.7 g/dL (ref 3.5–5.2)
BUN: 30 mg/dL — ABNORMAL HIGH (ref 6–23)
CO2: 22 meq/L (ref 19–32)
Calcium: 9.9 mg/dL (ref 8.4–10.5)
Chloride: 105 meq/L (ref 96–112)
Creatinine, Ser: 1.71 mg/dL — ABNORMAL HIGH (ref 0.40–1.20)
Glucose, Bld: 91 mg/dL (ref 70–99)
HIV 1 RNA Quant: 172 copies/mL — ABNORMAL HIGH (ref <48)
HIV-1 RNA Quant, Log: 2.24 — ABNORMAL HIGH (ref <1.68)
Hemoglobin: 13.7 g/dL (ref 12.0–15.0)
Lymphs Abs: 3.1 10*3/uL (ref 0.7–4.0)
MCHC: 33.5 g/dL (ref 30.0–36.0)
Monocytes Absolute: 0.7 10*3/uL (ref 0.1–1.0)
Monocytes Relative: 9 % (ref 3–12)
Neutro Abs: 3.5 10*3/uL (ref 1.7–7.7)
Neutrophils Relative %: 48 % (ref 43–77)
Potassium: 4.3 meq/L (ref 3.5–5.3)
RBC: 4.54 M/uL (ref 3.87–5.11)
WBC: 7.4 10*3/uL (ref 4.0–10.5)

## 2008-12-18 ENCOUNTER — Ambulatory Visit: Payer: Self-pay | Admitting: Internal Medicine

## 2008-12-18 DIAGNOSIS — N259 Disorder resulting from impaired renal tubular function, unspecified: Secondary | ICD-10-CM | POA: Insufficient documentation

## 2009-03-18 ENCOUNTER — Ambulatory Visit: Payer: Self-pay | Admitting: Internal Medicine

## 2009-03-18 LAB — CONVERTED CEMR LAB
ALT: 21 units/L (ref 0–35)
AST: 18 units/L (ref 0–37)
Alkaline Phosphatase: 67 units/L (ref 39–117)
Basophils Absolute: 0 10*3/uL (ref 0.0–0.1)
Eosinophils Absolute: 0.2 10*3/uL (ref 0.0–0.7)
Eosinophils Relative: 2 % (ref 0–5)
GFR calc non Af Amer: 29 mL/min — ABNORMAL LOW (ref >60)
Glucose, Bld: 105 mg/dL — ABNORMAL HIGH (ref 70–99)
HCT: 41.3 % (ref 36.0–46.0)
HIV-1 RNA Quant, Log: 1.98 — ABNORMAL HIGH (ref <1.68)
Hemoglobin: 13.7 g/dL (ref 12.0–15.0)
Lymphocytes Relative: 41 % (ref 12–46)
Lymphs Abs: 2.9 10*3/uL (ref 0.7–4.0)
MCV: 93.9 fL (ref 78.0–100.0)
Monocytes Absolute: 0.7 10*3/uL (ref 0.1–1.0)
Potassium: 4.4 meq/L (ref 3.5–5.3)
RDW: 13 % (ref 11.5–15.5)
Sodium: 141 meq/L (ref 135–145)
Total Bilirubin: 0.4 mg/dL (ref 0.3–1.2)
Total Protein: 6.9 g/dL (ref 6.0–8.3)

## 2009-06-11 ENCOUNTER — Ambulatory Visit: Payer: Self-pay | Admitting: Internal Medicine

## 2009-06-11 LAB — CONVERTED CEMR LAB: HIV 1 RNA Quant: <48 copies/mL (ref <48)

## 2009-06-12 ENCOUNTER — Telehealth (INDEPENDENT_AMBULATORY_CARE_PROVIDER_SITE_OTHER): Payer: Self-pay | Admitting: Licensed Clinical Social Worker

## 2009-06-12 LAB — CONVERTED CEMR LAB
Albumin: 4.5 g/dL (ref 3.5–5.2)
BUN: 26 mg/dL — ABNORMAL HIGH (ref 6–23)
CO2: 23 meq/L (ref 19–32)
Calcium: 10.7 mg/dL — ABNORMAL HIGH (ref 8.4–10.5)
Chloride: 104 meq/L (ref 96–112)
Cholesterol: 279 mg/dL — ABNORMAL HIGH (ref 0–200)
Eosinophils Relative: 1 % (ref 0–5)
Glucose, Bld: 95 mg/dL (ref 70–99)
HCT: 42.8 % (ref 36.0–46.0)
HDL: 36 mg/dL — ABNORMAL LOW (ref >39)
Hemoglobin: 14 g/dL (ref 12.0–15.0)
Lymphocytes Relative: 43 % (ref 12–46)
Lymphs Abs: 3.3 10*3/uL (ref 0.7–4.0)
MCV: 93.2 fL (ref >=78.0)
Monocytes Absolute: 0.8 10*3/uL (ref 0.1–1.0)
Potassium: 4.5 meq/L (ref 3.5–5.3)
Triglycerides: 550 mg/dL — ABNORMAL HIGH (ref <150)
WBC: 7.8 10*3/uL (ref 4.0–10.5)

## 2009-08-28 ENCOUNTER — Ambulatory Visit: Payer: Self-pay | Admitting: Internal Medicine

## 2009-08-28 LAB — CONVERTED CEMR LAB
ALT: 32 units/L (ref 0–35)
AST: 22 units/L (ref 0–37)
Alkaline Phosphatase: 71 units/L (ref 39–117)
BUN: 28 mg/dL — ABNORMAL HIGH (ref 6–23)
Basophils Relative: 1 % (ref 0–1)
Chloride: 105 meq/L (ref 96–112)
Creatinine, Ser: 1.45 mg/dL — ABNORMAL HIGH (ref 0.40–1.20)
HIV 1 RNA Quant: <48 copies/mL (ref <48)
HIV-1 RNA Quant, Log: <1.68 (ref <1.68)
Hemoglobin: 12.7 g/dL (ref 12.0–15.0)
MCHC: 32.2 g/dL (ref 30.0–36.0)
MCV: 95.4 fL (ref >=78.0)
Monocytes Absolute: 0.5 10*3/uL (ref 0.1–1.0)
Monocytes Relative: 8 % (ref 3–12)
Neutro Abs: 3 10*3/uL (ref 1.7–7.7)
RBC: 4.13 M/uL (ref 3.87–5.11)

## 2009-09-03 LAB — CONVERTED CEMR LAB
HDL: 31 mg/dL — ABNORMAL LOW (ref >39)
Total CHOL/HDL Ratio: 8.4

## 2009-11-27 ENCOUNTER — Encounter (INDEPENDENT_AMBULATORY_CARE_PROVIDER_SITE_OTHER): Payer: Self-pay | Admitting: *Deleted

## 2009-12-03 ENCOUNTER — Encounter: Payer: Self-pay | Admitting: Internal Medicine

## 2009-12-10 ENCOUNTER — Ambulatory Visit: Payer: Self-pay | Admitting: Internal Medicine

## 2009-12-10 LAB — CONVERTED CEMR LAB: HIV 1 RNA Quant: <48 copies/mL (ref <48)

## 2009-12-11 LAB — CONVERTED CEMR LAB
ALT: 42 units/L — ABNORMAL HIGH (ref 0–35)
AST: 30 units/L (ref 0–37)
Alkaline Phosphatase: 68 units/L (ref 39–117)
BUN: 33 mg/dL — ABNORMAL HIGH (ref 6–23)
Basophils Absolute: 0 10*3/uL (ref 0.0–0.1)
Calcium: 10.2 mg/dL (ref 8.4–10.5)
Creatinine, Ser: 1.64 mg/dL — ABNORMAL HIGH (ref 0.40–1.20)
Eosinophils Absolute: 0.1 10*3/uL (ref 0.0–0.7)
Eosinophils Relative: 2 % (ref 0–5)
HCT: 43 % (ref 36.0–46.0)
Hemoglobin: 14.1 g/dL (ref 12.0–15.0)
Lymphocytes Relative: 43 % (ref 12–46)
MCHC: 32.8 g/dL (ref 30.0–36.0)
MCV: 95.1 fL (ref >=78.0)
Monocytes Absolute: 0.6 10*3/uL (ref 0.1–1.0)
RDW: 13.1 % (ref 11.5–15.5)
Total Bilirubin: 0.5 mg/dL (ref 0.3–1.2)

## 2010-02-16 ENCOUNTER — Emergency Department (HOSPITAL_COMMUNITY): Admission: EM | Admit: 2010-02-16 | Discharge: 2010-02-16 | Payer: Self-pay | Admitting: Emergency Medicine

## 2010-03-17 ENCOUNTER — Emergency Department (HOSPITAL_COMMUNITY): Admission: EM | Admit: 2010-03-17 | Discharge: 2010-03-17 | Payer: Self-pay | Admitting: Emergency Medicine

## 2010-04-17 ENCOUNTER — Ambulatory Visit (HOSPITAL_COMMUNITY): Admission: RE | Admit: 2010-04-17 | Discharge: 2010-04-17 | Payer: Self-pay | Admitting: *Deleted

## 2010-05-11 ENCOUNTER — Emergency Department (HOSPITAL_COMMUNITY): Admission: EM | Admit: 2010-05-11 | Discharge: 2010-05-11 | Payer: Self-pay | Admitting: Emergency Medicine

## 2010-05-12 ENCOUNTER — Ambulatory Visit: Payer: Self-pay | Admitting: Internal Medicine

## 2010-05-12 DIAGNOSIS — N3000 Acute cystitis without hematuria: Secondary | ICD-10-CM

## 2010-05-12 DIAGNOSIS — N3090 Cystitis, unspecified without hematuria: Secondary | ICD-10-CM | POA: Insufficient documentation

## 2010-05-12 LAB — CONVERTED CEMR LAB
BUN: 22 mg/dL (ref 6–23)
Basophils Relative: 1 % (ref 0–1)
CO2: 22 meq/L (ref 19–32)
Calcium: 9.3 mg/dL (ref 8.4–10.5)
Chloride: 105 meq/L (ref 96–112)
Creatinine, Ser: 1.43 mg/dL — ABNORMAL HIGH (ref 0.40–1.20)
HCT: 43.7 % (ref 36.0–46.0)
HIV 1 RNA Quant: <48 copies/mL (ref <48)
HIV-1 RNA Quant, Log: <1.68 (ref <1.68)
Hemoglobin: 14.2 g/dL (ref 12.0–15.0)
Lymphocytes Relative: 43 % (ref 12–46)
MCHC: 32.5 g/dL (ref 30.0–36.0)
Monocytes Absolute: 0.6 10*3/uL (ref 0.1–1.0)
Monocytes Relative: 8 % (ref 3–12)
Neutro Abs: 3.3 10*3/uL (ref 1.7–7.7)
RBC: 4.53 M/uL (ref 3.87–5.11)

## 2010-06-04 ENCOUNTER — Ambulatory Visit: Payer: Self-pay | Admitting: Internal Medicine

## 2010-06-04 DIAGNOSIS — IMO0001 Reserved for inherently not codable concepts without codable children: Secondary | ICD-10-CM | POA: Insufficient documentation

## 2010-06-04 LAB — CONVERTED CEMR LAB
Glucose, Urine, Semiquant: NEGATIVE
Specific Gravity, Urine: >=1.03
Urobilinogen, UA: 0.2
pH: 6

## 2010-06-05 LAB — CONVERTED CEMR LAB: Total CK: 47 units/L (ref 7–177)

## 2010-07-04 ENCOUNTER — Emergency Department (HOSPITAL_COMMUNITY): Admission: EM | Admit: 2010-07-04 | Discharge: 2010-07-04 | Payer: Self-pay | Admitting: Emergency Medicine

## 2010-07-16 ENCOUNTER — Encounter (HOSPITAL_COMMUNITY): Admission: RE | Admit: 2010-07-16 | Discharge: 2010-07-25 | Payer: Self-pay | Admitting: Family Medicine

## 2010-07-29 ENCOUNTER — Encounter (HOSPITAL_COMMUNITY): Admission: RE | Admit: 2010-07-29 | Discharge: 2010-08-28 | Payer: Self-pay | Admitting: Family Medicine

## 2010-08-29 ENCOUNTER — Ambulatory Visit: Payer: Self-pay | Admitting: Internal Medicine

## 2010-08-29 DIAGNOSIS — R5383 Other fatigue: Secondary | ICD-10-CM | POA: Insufficient documentation

## 2010-08-29 DIAGNOSIS — R5381 Other malaise: Secondary | ICD-10-CM | POA: Insufficient documentation

## 2010-08-29 LAB — CONVERTED CEMR LAB
ALT: 16 units/L (ref 0–35)
AST: 13 units/L (ref 0–37)
CO2: 28 meq/L (ref 19–32)
Creatinine, Ser: 1.48 mg/dL — ABNORMAL HIGH (ref 0.40–1.20)
Eosinophils Absolute: 0.1 10*3/uL (ref 0.0–0.7)
Eosinophils Relative: 1 % (ref 0–5)
HCT: 42.6 % (ref 36.0–46.0)
HIV 1 RNA Quant: <20 copies/mL (ref <20)
Lymphs Abs: 3.5 10*3/uL (ref 0.7–4.0)
MCV: 92.6 fL (ref 78.0–100.0)
Platelets: 251 10*3/uL (ref 150–400)
Total Bilirubin: 0.5 mg/dL (ref 0.3–1.2)
WBC: 8.2 10*3/uL (ref 4.0–10.5)

## 2010-09-15 ENCOUNTER — Encounter (INDEPENDENT_AMBULATORY_CARE_PROVIDER_SITE_OTHER): Payer: Self-pay | Admitting: *Deleted

## 2010-09-22 ENCOUNTER — Encounter: Payer: Self-pay | Admitting: Internal Medicine

## 2010-09-23 ENCOUNTER — Emergency Department (HOSPITAL_COMMUNITY): Admission: EM | Admit: 2010-09-23 | Discharge: 2010-09-23 | Payer: Self-pay | Admitting: Emergency Medicine

## 2010-09-26 ENCOUNTER — Emergency Department (HOSPITAL_COMMUNITY)
Admission: EM | Admit: 2010-09-26 | Discharge: 2010-09-26 | Payer: Self-pay | Source: Home / Self Care | Admitting: Emergency Medicine

## 2010-10-08 ENCOUNTER — Telehealth (INDEPENDENT_AMBULATORY_CARE_PROVIDER_SITE_OTHER): Payer: Self-pay | Admitting: *Deleted

## 2010-10-08 ENCOUNTER — Encounter (INDEPENDENT_AMBULATORY_CARE_PROVIDER_SITE_OTHER): Payer: Self-pay | Admitting: *Deleted

## 2010-11-03 ENCOUNTER — Encounter: Payer: Self-pay | Admitting: Internal Medicine

## 2010-11-03 ENCOUNTER — Ambulatory Visit
Admission: RE | Admit: 2010-11-03 | Discharge: 2010-11-03 | Payer: Self-pay | Source: Home / Self Care | Attending: Gastroenterology | Admitting: Gastroenterology

## 2010-11-03 DIAGNOSIS — R1319 Other dysphagia: Secondary | ICD-10-CM | POA: Insufficient documentation

## 2010-11-03 DIAGNOSIS — K625 Hemorrhage of anus and rectum: Secondary | ICD-10-CM | POA: Insufficient documentation

## 2010-11-05 ENCOUNTER — Encounter: Payer: Self-pay | Admitting: Gastroenterology

## 2010-11-13 ENCOUNTER — Ambulatory Visit (HOSPITAL_COMMUNITY)
Admission: RE | Admit: 2010-11-13 | Discharge: 2010-11-13 | Payer: Self-pay | Source: Home / Self Care | Attending: Internal Medicine | Admitting: Internal Medicine

## 2010-11-13 HISTORY — PX: ESOPHAGOGASTRODUODENOSCOPY: SHX1529

## 2010-11-16 ENCOUNTER — Encounter: Payer: Self-pay | Admitting: Gastroenterology

## 2010-11-24 ENCOUNTER — Encounter (INDEPENDENT_AMBULATORY_CARE_PROVIDER_SITE_OTHER): Payer: Self-pay | Admitting: *Deleted

## 2010-11-25 ENCOUNTER — Telehealth (INDEPENDENT_AMBULATORY_CARE_PROVIDER_SITE_OTHER): Payer: Self-pay | Admitting: Licensed Clinical Social Worker

## 2010-11-25 ENCOUNTER — Encounter (INDEPENDENT_AMBULATORY_CARE_PROVIDER_SITE_OTHER): Payer: Self-pay | Admitting: Licensed Clinical Social Worker

## 2010-11-25 NOTE — Assessment & Plan Note (Signed)
Summary: 109MONTH F/U/VS   CC:  f/u labs and Depression.  History of Present Illness: Pt had problems with sinusitis - was treated by her PCP. She has been taking her HIV meds every day.  Depression History:      The patient is having a depressed mood most of the day and has a diminished interest in her usual daily activities.        The patient denies that she feels like life is not worth living, denies that she wishes that she were dead, and denies that she has thought about ending her life.        Preventive Screening-Counseling & Management  Alcohol-Tobacco     Alcohol drinks/day: 0     Smoking Status: never     Passive Smoke Exposure: no   Updated Prior Medication List: ZIAGEN 300 MG TABS (ABACAVIR SULFATE) two once a day EMTRIVA 200 MG CAPS (EMTRICITABINE) one every 2 days ISENTRESS 400 MG TABS (RALTEGRAVIR POTASSIUM) Take 1 tablet by mouth two times a day VYTORIN 10-20 MG  TABS (EZETIMIBE-SIMVASTATIN) unknown dose by another MD LEXAPRO 10 MG  TABS (ESCITALOPRAM OXALATE) Take 1 tablet by mouth once a day AMITRIPTYLINE HCL 25 MG  TABS (AMITRIPTYLINE HCL) Take 1 tablet by mouth at bedtime LOVAZA 1 GM CAPS (OMEGA-3-ACID ETHYL ESTERS) Take 1 capsule by mouth two times a day  Current Allergies (reviewed today): ! HALDOL ! PHENERGAN ! DARVOCET ! NAVANE ! CODEINE ! COMPAZINE ! * TENOFOVIR Past History:  Past Medical History: Last updated: 08/27/2006 HIV disease  Review of Systems  The patient denies anorexia, fever, and weight loss.    Vital Signs:  Patient profile:   70 year old female Menstrual status:  postmenopausal Height:      64 inches (162.56 cm) Weight:      193 pounds (87.73 kg) BMI:     33.25 Temp:     97.5 degrees F (36.39 degrees C) oral Pulse rate:   73 / minute BP sitting:   134 / 84  (right arm)  Vitals Entered By: Jarrett Ables CMA (December 10, 2009 2:13 PM) CC: f/u labs, Depression Is Patient Diabetic? No Pain Assessment Patient in  pain? no      Nutritional Status BMI of > 30 = obese Nutritional Status Detail nl  Does patient need assistance? Functional Status Self care Ambulation Normal   Physical Exam  General:  alert, well-developed, well-nourished, and well-hydrated.   Head:  normocephalic and atraumatic.   Mouth:  pharynx pink and moist.   Lungs:  normal breath sounds.      Impression & Recommendations:  Problem # 1:  HIV DISEASE (ICD-042) Will obtain labs today. Counts have been good.  Pt to f/u in 3 months. Diagnostics Reviewed:  HIV: CDC-defined AIDS (11/27/2009)   CD4: 750 (08/29/2009)   CD4 %: 11 (01/04/2007) WBC: 6.4 (08/28/2009)   Hgb: 12.7 (08/28/2009)   HCT: 39.4 (08/28/2009)   Platelets: 198 (08/28/2009) HIV-1 RNA: <48 copies/mL (08/28/2009)   HBSAg: No (12/20/2006)  Problem # 2:  RENAL INSUFFICIENCY (ICD-588.9) stable  Other Orders: T-CD4SP ([facility]) (CD4SP) T-HIV Viral Load [phone]) T-Comprehensive Metabolic Panel (A999333) T-CBC w/Diff [id number]) T-RPR (Syphilis) [id number]) Est. Patient Level III [id number])  Patient Instructions: 1)  Please schedule a follow-up appointment in 3 months.

## 2010-11-25 NOTE — Consult Note (Signed)
Summary: [facility]   Imported By: Bonner Puna 12/18/2009 14:19:35  _____________________________________________________________________  External Attachment:    Type:   Image     Comment:   External Document

## 2010-11-25 NOTE — Consult Note (Signed)
Summary: [facility]   Imported By: Bonner Puna 10/01/2010 15:41:12  _____________________________________________________________________  External Attachment:    Type:   Image     Comment:   External Document

## 2010-11-25 NOTE — Assessment & Plan Note (Signed)
Summary: possible uti/TY   CC:  pt. c/o burning on urination and leg cramps x 3 days.  History of Present Illness: Pt c/o 2 days of burning with urination and urinary frequency.  No fever or chills.  No back pain.  She also c/o bilateral lower leg muscle pain particularly when she touches them. Pain is not worse with walking.  No swelling.  Preventive Screening-Counseling & Management  Alcohol-Tobacco     Alcohol drinks/day: 0     Smoking Status: never     Passive Smoke Exposure: no  Caffeine-Diet-Exercise     Caffeine use/day: coffee occassionally     Does Patient Exercise: yes     Type of exercise: walking  Hep-HIV-STD-Contraception     HIV Risk: no  Safety-Violence-Falls     Seat Belt Use: yes      Drug Use:  no.     Updated Prior Medication List: ZIAGEN 300 MG TABS (ABACAVIR SULFATE) two once a day EMTRIVA 200 MG CAPS (EMTRICITABINE) one every 2 days ISENTRESS 400 MG TABS (RALTEGRAVIR POTASSIUM) Take 1 tablet by mouth two times a day LEXAPRO 10 MG  TABS (ESCITALOPRAM OXALATE) Take 1 tablet by mouth once a day AMITRIPTYLINE HCL 25 MG  TABS (AMITRIPTYLINE HCL) Take 1 tablet by mouth at bedtime ATIVAN 2 MG TABS (LORAZEPAM) take one tablet two times a day PHENAZOPYRIDINE HCL 200 MG TABS (PHENAZOPYRIDINE HCL) take one tablet by mouth three times a day CIPROFLOXACIN HCL 500 MG TABS (CIPROFLOXACIN HCL) Take 1 tablet by mouth two times a day  Current Allergies (reviewed today): ! HALDOL ! PHENERGAN ! DARVOCET ! NAVANE ! CODEINE ! COMPAZINE ! * TENOFOVIR ! MORPHINE SULFATE (MORPHINE SULFATE) Past History:  Past Medical History: Last updated: 08/27/2006 HIV disease  Review of Systems  The patient denies anorexia and fever.    Vital Signs:  Patient profile:   70 year old female Menstrual status:  postmenopausal Height:      64 inches (162.56 cm) Weight:      188.0 pounds (85.45 kg) BMI:     32.39 Temp:     98.3 degrees F (36.83 degrees C) oral Pulse rate:    74 / minute BP sitting:   143 / 84  (right arm) CC: pt. c/o burning on urination and leg cramps x 3 days Is Patient Diabetic? No Pain Assessment Patient in pain? yes     Location: legs Intensity: 8 Type: stinging Onset of pain  Constant Nutritional Status BMI of > 30 = obese Nutritional Status Detail appetite "normal"  Does patient need assistance? Functional Status Self care Ambulation Normal Comments no missed doses of meds per patient   Physical Exam  General:  alert, well-developed, well-nourished, and well-hydrated.   Head:  normocephalic and atraumatic.   Extremities:  pain with palpation of calf muscles bilaterally no cord felt nos swelling   Impression & Recommendations:  Problem # 1:  ACUTE CYSTITIS (ICD-595.0) Urine dip shows moderate leuk and blood will send for culture and sensitivity ciprofloxacin for 10 days pending culture results abd pyridium for discomfort. Her updated medication list for this problem includes:    Phenazopyridine Hcl 200 Mg Tabs (Phenazopyridine hcl) .Marland Kitchen... Take one tablet by mouth three times a day    Ciprofloxacin Hcl 500 Mg Tabs (Ciprofloxacin hcl) .Marland Kitchen... Take 1 tablet by mouth two times a day  Orders: T-Urinalysis Dipstick only [id number]) Est. Patient Level III [id number]) T-Culture, Urine [id number])  Problem # 2:  MUSCLE PAIN (ICD-729.1) check CPK Orders:  T-CK Total [phone])  Problem # 3:  HIV DISEASE (ICD-042) Continue current meds and f/u as scheduled. Diagnostics Reviewed:  HIV: CDC-defined AIDS (11/27/2009)   CD4: 800 (05/13/2010)   CD4 %: 11 (01/04/2007) WBC: 7.2 (05/12/2010)   Hgb: 14.2 (05/12/2010)   HCT: 43.7 (05/12/2010)   Platelets: 228 (05/12/2010) HIV-1 RNA: <48 copies/mL (05/12/2010)   HBSAg: No (12/20/2006)  Problem # 4:  RENAL INSUFFICIENCY (ICD-588.9) stable  Medications Added to Medication List This Visit: 1)  Ciprofloxacin Hcl 500 Mg Tabs (Ciprofloxacin hcl) .... Take 1 tablet by mouth two times a  day Prescriptions: PHENAZOPYRIDINE HCL 200 MG TABS (PHENAZOPYRIDINE HCL) take one tablet by mouth three times a day  #10 x 0   Entered and Authorized by:   Aldona Bar MD   Signed by:   Aldona Bar MD on 06/04/2010   Method used:   Print then Give to Patient   RxID:   [id number] CIPROFLOXACIN HCL 500 MG TABS (CIPROFLOXACIN HCL) Take 1 tablet by mouth two times a day  #20 x 0   Entered and Authorized by:   Aldona Bar MD   Signed by:   Aldona Bar MD on 06/04/2010   Method used:   Print then Give to Patient   RxID:   [id number]   Laboratory Results   Urine Tests  Date/Time Received: Dolan Amen  June 04, 2010 3:44 PM  Date/Time Reported: Dolan Amen  June 04, 2010 3:44 PM   Routine Urinalysis   Color: yellow Appearance: Cloudy Glucose: negative   (Normal Range: Negative) Bilirubin: negative   (Normal Range: Negative) Ketone: trace (5)   (Normal Range: Negative) Spec. Gravity: >=1.030   (Normal Range: 1.003-1.035) Blood: moderate   (Normal Range: Negative) pH: 6.0   (Normal Range: 5.0-8.0) Protein: 100   (Normal Range: Negative) Urobilinogen: 0.2   (Normal Range: 0-1) Nitrite: negative   (Normal Range: Negative) Leukocyte Esterace: moderate   (Normal Range: Negative)

## 2010-11-25 NOTE — Assessment & Plan Note (Signed)
Summary: 22MONTH F/U/VS   CC:  follow-up visit, lab results, c/o bloody mucus with bowel movements, and recently treated for UTI by PCP.  History of Present Illness: Pt had another UTI and was treated by her PCP. She c/o a small amount of blood and mucous in her stool when she wipes.  No abdominal pain or melana. No diarrhea.  She continues to feel fatigued.  Preventive Screening-Counseling & Management  Alcohol-Tobacco     Alcohol drinks/day: 0     Smoking Status: never     Passive Smoke Exposure: no  Caffeine-Diet-Exercise     Caffeine use/day: coffee 2 per day     Does Patient Exercise: no  Hep-HIV-STD-Contraception     HIV Risk: risk noted  Safety-Violence-Falls     Seat Belt Use: yes      Drug Use:  never and no.     Updated Prior Medication List: ZIAGEN 300 MG TABS (ABACAVIR SULFATE) two once a day EMTRIVA 200 MG CAPS (EMTRICITABINE) one every 2 days ISENTRESS 400 MG TABS (RALTEGRAVIR POTASSIUM) Take 1 tablet by mouth two times a day LEXAPRO 10 MG  TABS (ESCITALOPRAM OXALATE) Take 1 tablet by mouth once a day AMITRIPTYLINE HCL 25 MG  TABS (AMITRIPTYLINE HCL) Take 1 tablet by mouth at bedtime ATIVAN 2 MG TABS (LORAZEPAM) take one tablet two times a day  Current Allergies (reviewed today): ! HALDOL ! PHENERGAN ! DARVOCET ! NAVANE ! CODEINE ! COMPAZINE ! * TENOFOVIR ! MORPHINE SULFATE (MORPHINE SULFATE) Past History:  Past Medical History: Last updated: 08/27/2006 HIV disease  Social History: Drug Use:  never, no  Vital Signs:  Patient profile:   70 year old female Menstrual status:  postmenopausal Height:      64 inches (162.56 cm) Weight:      190.8 pounds (86.73 kg) BMI:     32.87 Temp:     98.5 degrees F (36.94 degrees C) oral Pulse rate:   82 / minute BP sitting:   161 / 87  (right arm)  Vitals Entered By: Myrtis Hopping CMA Deborra Medina) (August 29, 2010 2:10 PM) CC: follow-up visit, lab results, c/o bloody mucus with bowel movements,  recently treated for UTI by PCP Is Patient Diabetic? No Pain Assessment Patient in pain? yes     Location: stomach Intensity: 2 Type: aching Onset of pain  Intermittent Nutritional Status BMI of > 30 = obese Nutritional Status Detail appetite "good"  Have you ever been in a relationship where you felt threatened, hurt or afraid?Unable to ask   Does patient need assistance? Functional Status Self care Ambulation Normal Comments no missed doses of meds per pt.   Physical Exam  General:  alert, well-developed, well-nourished, and well-hydrated.   Head:  normocephalic and atraumatic.   Mouth:  pharynx pink and moist.   Lungs:  normal breath sounds.     Impression & Recommendations:  Problem # 1:  IRRITABLE BOWEL SYNDROME (ICD-564.1) refer back to GI Orders: Gastroenterology Referral (GI)  Problem # 2:  FATIGUE (ICD-780.79)  will try a B12 injection  Orders: Vit B12 1000 mcg (J3420) Admin of Therapeutic Inj  intramuscular or subcutaneous [id number])  Problem # 3:  HIV DISEASE (ICD-042) Pt doing well.  [place] labs today. Orders: Est. Patient Level III [id number]) T-CBC w/Diff ST:[id number]) T-Comprehensive Metabolic Panel (A999333) T-HIV Viral Load [phone])  Diagnostics Reviewed:  HIV: CDC-defined AIDS (11/27/2009)   CD4: 800 (05/13/2010)   CD4 %: 11 (01/04/2007) WBC: 7.2 (05/12/2010)   Hgb: 14.2 (  05/12/2010)   HCT: 43.7 (05/12/2010)   Platelets: 228 (05/12/2010) HIV-1 RNA: <48 copies/mL (05/12/2010)   HBSAg: No (12/20/2006)  Patient Instructions: 1)  Please schedule a follow-up appointment in 3 months.     Immunization History:  Influenza Immunization History:    Influenza:  historical (07/09/2010)    Medication Administration  Injection # 1:    Medication: Vit B12 1000 mcg    Diagnosis: FATIGUE (ICD-780.79)    Route: IM    Site: R deltoid    Exp Date: 05/26/2012    Lot #: [id number]    Mfr: Clinch    Patient tolerated  injection without complications    Given by: Myrtis Hopping CMA Deborra Medina) (August 29, 2010 2:51 PM)  Orders Added: 1)  Est. Patient Level III [99213] 2)  T-CBC w/Diff [id number] 3)  T-Comprehensive Metabolic Panel [id number] 4)  T-HIV Viral Load [id number] 5)  Gastroenterology Referral [GI] 6)  Vit B12 1000 mcg [J3420] 7)  Admin of Therapeutic Inj  intramuscular or subcutaneous [id number]

## 2010-11-25 NOTE — Assessment & Plan Note (Signed)
Summary: PER JACKIE F/U [MKJ]   CC:  follow-up visit and labs, seen at [place] for UTI 04/1710, and had pneumonia in April 2011.  History of Present Illness: Pt went to ED yesterday with UTI symptoms. She had pelvic discomfort, urinary frquency, burning and back pain. No fever.  UA showed to numerous to count WBCs and UC showed E. coli with sensitivities pending. She was placed on nitrofurantoin and pyridium. She feels a little better today. She was treated as an outpatient for pneumonia in April. Pt needs labs done today. No missed doses of her HIV meds.  Preventive Screening-Counseling & Management  Alcohol-Tobacco     Alcohol drinks/day: 0     Smoking Status: never     Passive Smoke Exposure: no  Caffeine-Diet-Exercise     Caffeine use/day: coffee occassionally     Does Patient Exercise: yes     Type of exercise: walking  Hep-HIV-STD-Contraception     HIV Risk: no  Safety-Violence-Falls     Seat Belt Use: yes      Drug Use:  no.    Comments: pt. declined condoms   Updated Prior Medication List: ZIAGEN 300 MG TABS (ABACAVIR SULFATE) two once a day EMTRIVA 200 MG CAPS (EMTRICITABINE) one every 2 days ISENTRESS 400 MG TABS (RALTEGRAVIR POTASSIUM) Take 1 tablet by mouth two times a day LEXAPRO 10 MG  TABS (ESCITALOPRAM OXALATE) Take 1 tablet by mouth once a day AMITRIPTYLINE HCL 25 MG  TABS (AMITRIPTYLINE HCL) Take 1 tablet by mouth at bedtime ATIVAN 2 MG TABS (LORAZEPAM) take one tablet two times a day PHENAZOPYRIDINE HCL 200 MG TABS (PHENAZOPYRIDINE HCL) take one tablet by mouth three times a day NITROFURANTOIN MONOHYD MACRO 100 MG CAPS (NITROFURANTOIN MONOHYD MACRO) take one capsule by mouth two times a day  Current Allergies (reviewed today): ! HALDOL ! PHENERGAN ! DARVOCET ! NAVANE ! CODEINE ! COMPAZINE ! * TENOFOVIR ! MORPHINE SULFATE (MORPHINE SULFATE) Past History:  Past Medical History: Last updated: 08/27/2006 HIV disease  Review of  Systems  The patient denies anorexia, fever, and weight loss.    Vital Signs:  Patient profile:   70 year old female Menstrual status:  postmenopausal Height:      64 inches (162.56 cm) Weight:      186.12 pounds (84.60 kg) BMI:     32.06 Temp:     98.2 degrees F (36.78 degrees C) oral Pulse rate:   70 / minute BP sitting:   147 / 87  (right arm)  Vitals Entered By: Myrtis Hopping CMA Deborra Medina) (May 12, 2010 1:56 PM) CC: follow-up visit and labs, seen at [place] for UTI 04/1710, had pneumonia in April 2011 Is Patient Diabetic? No Pain Assessment Patient in pain? yes     Location: lower abdomen Intensity: 7 Type: aching Onset of pain  Intermittent Nutritional Status BMI of > 30 = obese Nutritional Status Detail appetite "up and down"  Does patient need assistance? Functional Status Self care Ambulation Normal Comments no missed doses of HAART meds per patient   Physical Exam  General:  alert, well-developed, well-nourished, and well-hydrated.   Head:  normocephalic and atraumatic.   Mouth:  pharynx pink and moist.   Lungs:  normal breath sounds.      Impression & Recommendations:  Problem # 1:  HIV DISEASE (ICD-042) Last CD4ct 890 and VL <48. We will obtain labs today and have pt f/u in 3 months.  She is to continue current meds.  Diagnostics Reviewed:  HIV: CDC-defined AIDS (11/27/2009)   CD4: 890 (12/11/2009)   CD4 %: 11 (01/04/2007) WBC: 7.4 (12/10/2009)   Hgb: 14.1 (12/10/2009)   HCT: 43.0 (12/10/2009)   Platelets: 216 (12/10/2009) HIV-1 RNA: <48 copies/mL (12/10/2009)   HBSAg: No (12/20/2006)  Problem # 2:  ACUTE CYSTITIS (ICD-595.0) on nitrofurantoin will check sensitivities and adjust meds if needed Her updated medication list for this problem includes:    Phenazopyridine Hcl 200 Mg Tabs (Phenazopyridine hcl) .Marland Kitchen... Take one tablet by mouth three times a day    Nitrofurantoin Monohyd Macro 100 Mg Caps (Nitrofurantoin monohyd macro) .Marland Kitchen... Take  one capsule by mouth two times a day  Problem # 3:  RENAL INSUFFICIENCY (ICD-588.9) stable  Medications Added to Medication List This Visit: 1)  Ativan 2 Mg Tabs (Lorazepam) .... Take one tablet two times a day 2)  Phenazopyridine Hcl 200 Mg Tabs (Phenazopyridine hcl) .... Take one tablet by mouth three times a day 3)  Nitrofurantoin Monohyd Macro 100 Mg Caps (Nitrofurantoin monohyd macro) .... Take one capsule by mouth two times a day  Other Orders: T-CD4SP ([facility]) (CD4SP) T-HIV Viral Load [phone]) T-Comprehensive Metabolic Panel (A999333) T-CBC w/Diff [id number]) Est. Patient Level III [id number])  Patient Instructions: 1)  Please schedule a follow-up appointment in 3 months.

## 2010-11-25 NOTE — Miscellaneous (Signed)
  Clinical Lists Changes  Observations: Added new observation of YEARAIDSPOS: 1998  (09/15/2010 11:25)

## 2010-11-25 NOTE — Miscellaneous (Signed)
Summary: RW Update  Clinical Lists Changes  Observations: Added new observation of HIV STATUS: CDC-defined AIDS (11/27/2009 15:45)

## 2010-11-25 NOTE — Op Note (Signed)
[name], [name]               ACCOUNT NO.:  [id number]  MEDICAL RECORD NO.:  [id number]          PATIENT TYPE:  AMB  LOCATION:  DAY                           FACILITY:  [facility]  PHYSICIAN:  R. Garfield Cornea, M.D. DATE OF BIRTH:  11-Nov-1940  DATE OF PROCEDURE:  11/13/2010 DATE OF DISCHARGE:                              OPERATIVE REPORT   PROCEDURE:  Esophagogastroduodenoscopy with Venia Minks dilation followed by incomplete colonoscopy.  INDICATIONS FOR PROCEDURE:  A 2-year lady with HIV with recurrent esophageal dysphagia to solids, history of undergoing EGD with dilation in the years past.  She does have some upper abdominal pain from time-to- time.  She does have intermittent low-volume painless rectal bleeding. EGD and colonoscopy now being done.  Risks, benefits, limitations, imponderables and alternatives have been reviewed.  Because of polypharmacy, she is being done in the OR under propofol.  Please see the documentation medical record.  PROCEDURE NOTE:  The patient was placed in the left lateral decubitus position and propofol sedation provided by Dr. [facility]. Cetacaine for topical pharyngeal anesthesia.  FINDINGS:  EGD examination of the tubular esophagus revealed a soft noncritical-appearing peptic stricture with circumferential distal esophageal erosions at the EG junction.  There was no Barrett esophagus or evidence neoplasia.  The adult gastroscope was easily traversed in the segment of GI tract.  Stomach:  Gastric cavity was emptied and insufflated well with air.  Thorough examination of the gastric mucosa including retroflexion of proximal stomach and esophagogastric junction demonstrate a small hiatal hernia and a couple of antral erosions. There was no ulcer or infiltrating process.  Pylorus was patent and easily traversed.  Examination of the bulb, second portion revealed no abnormalities.  Therapeutic/diagnostic maneuvers performed:  Scope  was withdrawn.  A 56- French Maloney dilators passed full insertion with ease look back revealed no apparent complication related passage of the dilator.  The patient tolerated the procedure well.  Colonoscopy:  Digital rectal exam revealed 1 external hemorrhoidal tag. Endoscopic findings:  Prep was poor.  There was formed stool elements in the lumen of the proximal rectum up in the sigmoid was able to get around a couple of these formed stool boluses but there was a much more stool upstream along with a chunky viscous semiformed stool was obvious that prep was inadequate, I was not going to be on the complete exam.  I did note the patient's sigmoid diverticula, I withdrew the scope.  The patient tolerated this attempt very well along with the EGD and was taken to the PACU in stable condition.  IMPRESSION: 1. Circumferential distal esophageal erosions with soft peptic     stricture, consistent with erosive reflux esophagitis or stricture     formation, status post dilation as described above. 2. Small hiatal hernia. 3. Tiny antral erosions, otherwise normal stomach D1 and D2.     Colonoscopy findings, poor prep precluded, incomplete examination,     she did have sigmoid diverticulum, at least one external     hemorrhoidal tag.  RECOMMENDATIONS: 1. The patient needs to be treated for gastroesophageal reflux     disease.  I  called [name] in the [facility] and reviewed     her medications in detail to find a PPI that would not have any     potential drug interactions with her antiviral therapy and other     medications, Protonix need to be safe in the setting, we started     her Protonix 40 mg orally daily, antireflux lifestyle/literature     provided to [name]. 2. We will have her contact the office, regroup and reschedule a     colonoscopy in the setting of, hopefully, a good prep in the near     future.     Bridgette Habermann, M.D.     RMR/MEDQ  D:   11/13/2010  T:  11/13/2010  Job:  [id number]  cc:   Dr. Teresa Pelton, [facility]  Electronically Signed by Jannette Spanner M.D. on 11/25/2010 01:38:15 PM

## 2010-11-27 NOTE — Letter (Signed)
Summary: REFERRAL FROM DR Melina Copa  REFERRAL FROM DR BUTLER   Imported By: Hoy Morn 11/05/2010 15:34:10  _____________________________________________________________________  External Attachment:    Type:   Image     Comment:   External Document

## 2010-11-27 NOTE — Progress Notes (Signed)
Summary: c/o UTI symptoms  Phone Note Call from Patient   Caller: Patient Summary of Call: Pt. called c/o UTI, advised her to call her PCP and that she possibly would need a referral to urology for frequent UTI Initial call taken by: Myrtis Hopping CMA Deborra Medina),  October 08, 2010 10:37 AM

## 2010-11-27 NOTE — Letter (Signed)
Summary: Unable to Reach, Consult Scheduled  [facility]  [address]   [place], [place] [address]   Phone: [phone]  Fax: [phone]    10/08/2010  SHANTALL HOWD [address], [place]  [address] January 27, 1941   Dear Ms. Casimer Leek,   At the recommendation of DR BUTLER  we have been asked to schedule you a consult with DR FIELDS for BLOOD IN STOOLS.    Please call our office at [phone].     Thank you,    [facility] R. Garfield Cornea, M.D.    Barney Drain, M.D. Vickey Huger, FNP-BC    Neil Crouch, PA-C Phone: [phone]    Fax: [phone]

## 2010-11-27 NOTE — Assessment & Plan Note (Signed)
Summary: RECTAL BLEEDING/BLOOD IN STOOLS S/P NOSEBLEEDS   Visit Type:  Initial Consult Referring Provider:  Dr. Octavio Graves Primary Care Provider:  Dr. Octavio Graves  CC:  rectal bleeding, abd pain, and nausea at times.  History of Present Illness: [name] presents today at the request of Dr. Octavio Graves secondary to rectal bleeding. Hx of HIV, thought to be secondary to blood transfusion in past. Has a hx of IBS-mixed. Last colonoscopy/EGD was in 02/07/2007 by Dr. Oletta Lamas, showed diffuse colitis in transverse and descending colon, biopsies with mild active mucosal colitis associated with exudate, diff dx include ischemic colitis vs pseudomembranous colitis, no features to suggest IBD. EGD normal.  Had two prior EGDs by Dr. Laural Golden with dilation each time secondary to dysphagia, also showing gastritis and reflux esophagitis.  Reports onset of rectal bleeding X several months. Reports 4-5 loose stools per day, intermittent brbpr, large amount. States has hx of chronic abdominal pain. c/o lower abdominal pain, intermittent, not r/t BM. can be as much as 10/10 pain, feels like "having a baby". A "shot" relieves it. Goes to ED for relief. ?unsure of name.  Takes supplemental fiber, either benefiber or metamucil, which helps with frequency. c/o nausea associated with abdominal pain. denies symptomatic reflux. reports esophageal dysphagia with the majority of meals, all types of foods. No epigastric discomfort. No loss of appetite.   Current Medications (verified): 1)  Emtriva 200 Mg Caps (Emtricitabine) .... One Every 2 Days 2)  Isentress 400 Mg Tabs (Raltegravir Potassium) .... Take 1 Tablet By Mouth Two Times A Day 3)  Lexapro 10 Mg  Tabs (Escitalopram Oxalate) .... Take 1 Tablet By Mouth Once A Day 4)  Amitriptyline Hcl 25 Mg  Tabs (Amitriptyline Hcl) .... Take 1 Tablet By Mouth At Bedtime 5)  Ativan 2 Mg Tabs (Lorazepam) .... Take One Tablet Two Times A Day 6)  Lovasa .... Once Daily 7)  Blood  Pressure Medication .... Once Daily 8)  Percocet 5-325 Mg Tabs (Oxycodone-Acetaminophen) .... As Needed  Allergies (verified): 1)  ! Haldol 2)  ! Phenergan 3)  ! Darvocet 4)  ! Navane 5)  ! Codeine 6)  ! Compazine 7)  ! * Tenofovir 8)  ! Morphine Sulfate (Morphine Sulfate)  Past History:  Past Medical History: HIV disease Chronic renal insufficiency Depression CVA in the 80s HTN Anxiety  TCS/EGD Feb 07, 2007 by Dr. Oletta Lamas, showed diffuse colitis in transverse and descending colon, biopsies with mild active mucosal colitis associated with exudate, diff dx include ischemic colitis vs pseudomembranous colitis, no features to suggest IBD. normal esophagus TCS/EGD with ED, Dr. Laural Golden 2002-02-06: showing reflux esophagitis, no evidence of ring or stricture, +dilation of esophagus with 56 French Maloney dilator, +gastritis, normal colonoscopy except hemorrhoids and some erythema at dentate line EGD/ED Feb 07, 2003 by Dr. Laural Golden: erosive antral gastritis, normal esophagus, s/p dilation   Past Surgical History: Hysterectomy cholecystectomy tubal ligation bladder tack  Family History: Mother:84, CAD, DM Father:90, CVA Uncle: colon ca, deceased 2 years ago  Social History: Disabled Widowed, husband passed away in 1999-02-07 3 adult children Patient has never smoked.  Alcohol Use - no Illicit Drug Use - no Drug Use:  no  Review of Systems General:  Denies fever, chills, and anorexia. Eyes:  Denies blurring, irritation, and discharge. ENT:  Denies sore throat, hoarseness, and difficulty swallowing. CV:  Denies chest pains and syncope. Resp:  Denies dyspnea at rest and wheezing. GI:  Complains of difficulty swallowing, nausea, abdominal pain, and bloody BM's; denies pain  on swallowing and constipation. GU:  Denies urinary burning and urinary frequency. MS:  Denies joint pain / LOM, joint swelling, and joint stiffness. Derm:  Denies rash, itching, and dry skin. Neuro:  Denies weakness and  syncope. Psych:  Denies depression and anxiety. Endo:  Denies cold intolerance and heat intolerance. Heme:  Denies bruising and bleeding.  Vital Signs:  Patient profile:   70 year old female Menstrual status:  postmenopausal Height:      64 inches Weight:      193 pounds BMI:     33.25 Temp:     97.8 degrees F oral Pulse rate:   60 / minute BP sitting:   128 / 90  (left arm) Cuff size:   regular  Vitals Entered By: Burnadette Peter LPN (January  9, [id number] 9:37 AM)  Physical Exam  General:  Well developed, well nourished, no acute distress. Head:  Normocephalic and atraumatic. Eyes:  sclera without icterus Mouth:  No deformity or lesions, dentition normal. Neck:  Supple; no masses or thyromegaly. Lungs:  Clear throughout to auscultation. Heart:  Regular rate and rhythm; no murmurs, rubs,  or bruits. Abdomen:  +BS, soft, mild tenderness to palpation lower abdomen, no rebound or guarding. no HSM.  Msk:  Symmetrical with no gross deformities. Normal posture. Pulses:  Normal pulses noted. Extremities:  trace pedal edema Neurologic:  Alert and  oriented x4;  grossly normal neurologically. Skin:  Intact without significant lesions or rashes. Psych:  Alert and cooperative. Normal mood and affect.  Impression & Recommendations:  Problem # 1:  RECTAL BLEEDING (ICD-48.28) 70 year old female, HIV positive likely secondary to past hx of blood transfusion with hx of IBS-mixed. Presents with several month hx of new onset rectal bleeding, 4-5 loose stools per day. Also hx of chronic lower abd pain, intermittent, unrelated to bowel habits. Has reported to ED (no records from [facility]) for relief. Says given a "shot". Last TCS 2008 by Dr. Oletta Lamas with diffuse colitis in transverse/descending colon, biopsieswith mild active mucosal colitis associated with exudate, diff dx include ischemic colitis vs pseudomembranous colitis, no features to suggest IBD, prior colonoscopy in 2003 showing hemorrhoids and some  erythema at dentate line. With hx of IBS, loose stools likely r/t diarrhea component, rectal bleeding could be r/t hx of hemorrhoids; however, due to new onset and date of last colonoscopy, will proceed with TCS in OR secondary to polypharmacy. Doubt infectious etiology.  TCS in OR secondary to polypharmacy with RMR: the risks, benefits, alternatives have been discussed in detail with pt; she stated understanding.  Problem # 2:  DYSPHAGIA (ICD-787.29) Reports esophageal dysphagia with the majority of meals; no epigastric discomfort, no loss of appetite. Last EGD in 2008, normal esophagus, yet did have dilation for symptomatic dysphagia in 2003 with Dr. Laural Golden, no evidence of ring or stricture. +hx of reflux esophagitis.   Will proceed with EGD possible ED in OR secondary to polypharmacy  to evaluate dysphagia: the risks, benefits, alternatives have been discussed in detail with pt; stated understanding.   Appended Document: Orders Update    Clinical Lists Changes  Orders: Added new Service order of New Patient Level III [phone]) - Signed

## 2010-11-27 NOTE — Progress Notes (Signed)
Summary: pt. cancelled colonoscopy  Phone Note Other Incoming   Caller: Caryl Pina at [place] of Call: Received call from [place] at [place].  Pt. cancelled colonoscopy the day of appt. due to a nosebleed.  They tried to contact pt. four times and she would not return phone calls.  Sent her a letter to call and reschedule.  I also spoke to pt. and advised her to call and reschedule appt. Initial call taken by: Myrtis Hopping CMA Deborra Medina),  October 08, 2010 4:15 PM

## 2010-11-27 NOTE — Letter (Signed)
Summary: TCS/EGD WITH ED ORDER  TCS/EGD WITH ED ORDER   Imported By: Sofie Rower 11/03/2010 10:29:13  _____________________________________________________________________  External Attachment:    Type:   Image     Comment:   External Document

## 2010-12-01 ENCOUNTER — Other Ambulatory Visit: Payer: Self-pay | Admitting: Infectious Diseases

## 2010-12-01 ENCOUNTER — Encounter (INDEPENDENT_AMBULATORY_CARE_PROVIDER_SITE_OTHER): Payer: Self-pay | Admitting: *Deleted

## 2010-12-01 ENCOUNTER — Other Ambulatory Visit (INDEPENDENT_AMBULATORY_CARE_PROVIDER_SITE_OTHER): Payer: Medicare Other

## 2010-12-01 ENCOUNTER — Encounter: Payer: Self-pay | Admitting: Infectious Diseases

## 2010-12-01 DIAGNOSIS — B20 Human immunodeficiency virus [HIV] disease: Secondary | ICD-10-CM

## 2010-12-01 LAB — CONVERTED CEMR LAB
AST: 13 units/L (ref 0–37)
Alkaline Phosphatase: 70 units/L (ref 39–117)
BUN: 36 mg/dL — ABNORMAL HIGH (ref 6–23)
Basophils Absolute: 0 10*3/uL (ref 0.0–0.1)
Calcium: 9.4 mg/dL (ref 8.4–10.5)
Creatinine, Ser: 1.4 mg/dL — ABNORMAL HIGH (ref 0.40–1.20)
Eosinophils Absolute: 0.2 10*3/uL (ref 0.0–0.7)
Eosinophils Relative: 2 % (ref 0–5)
HCT: 38.8 % (ref 36.0–46.0)
Hemoglobin: 12.9 g/dL (ref 12.0–15.0)
Lymphocytes Relative: 44 % (ref 12–46)
MCHC: 33.2 g/dL (ref 30.0–36.0)
MCV: 93 fL (ref 78.0–100.0)
Monocytes Absolute: 0.6 10*3/uL (ref 0.1–1.0)
RDW: 13.2 % (ref 11.5–15.5)
Total Bilirubin: 0.5 mg/dL (ref 0.3–1.2)

## 2010-12-02 LAB — T-HELPER CELL (CD4) - (RCID CLINIC ONLY)
CD4 % Helper T Cell: 26 % — ABNORMAL LOW (ref 33–55)
CD4 T Cell Abs: 790 uL (ref 400–2700)

## 2010-12-03 NOTE — Progress Notes (Signed)
  Phone Note Call from Patient   Summary of Call: Patient states that her Ziagen was stopped by Dr. [name] and she hasn't taken Emtriva since 2008 when Dr. [name] was here. I checked the medication list and Ziagen was removed by the nurse that did her colonoscopy consultation, and the Emtriva was never removed. I checked with her pharmacy ([facility]) and they said she was only filling the Ziagen and Isentress, and she havent filled the Emtriva since 2008. Will remove the Emtriva and add back the Ziagen and send to the pharmacy. Initial call taken by: Jarrett Ables CMA,  November 25, 2010 11:00 AM

## 2010-12-03 NOTE — Miscellaneous (Signed)
Summary: Orders Update  Clinical Lists Changes  Medications: Removed medication of EMTRIVA 200 MG CAPS (EMTRICITABINE) one every 2 days Added new medication of ZIAGEN 300 MG TABS (ABACAVIR SULFATE) 2 once daily - Signed Rx of ZIAGEN 300 MG TABS (ABACAVIR SULFATE) 2 once daily;  #60 x 6;  Signed;  Entered by: Jarrett Ables CMA;  Authorized by: Alcide Evener MD;  Method used: Electronically to [facility]*, [address], [place], [place], [place]  [address], Ph: [phone], Fax: [phone]    Prescriptions: ZIAGEN 300 MG TABS (ABACAVIR SULFATE) 2 once daily  #60 x 6   Entered by:   Jarrett Ables CMA   Authorized by:   Alcide Evener MD   Signed by:   Jarrett Ables CMA on 11/25/2010   Method used:   Electronically to        [facility]* (retail)       [address]       [place], [place]  [address]       Ph: [phone]       Fax: [phone]   RxID:   [id number]

## 2010-12-03 NOTE — Letter (Signed)
Summary: Radiology Test Reminder  [facility]  [address]   [place], [place] [address]   Phone: [phone]  Fax: [phone]     November 24, 2010   CAITLIN COMIS [id number] [place], [place]  [address] Feb 20, 1941  Dear Ms. Casimer Leek,  During your last appointment, your doctor requested you have a repeat Colonoscopy.  Our records indicate you have not had this done.  Remember it is very important to follow your doctor's instructions.  Please have this done as soon as possible.  If you have questions regarding this appointment, please call our office and we can reschedule this for you.  It is important that patients and their doctor work together in the management and treatment of their health care.  If you have already had your test done, please disregard this letter.  Thank you,    [facility] Ph: [phone]   Fax: [phone]

## 2010-12-15 ENCOUNTER — Ambulatory Visit: Payer: Self-pay | Admitting: Adult Health

## 2010-12-30 ENCOUNTER — Encounter: Payer: Self-pay | Admitting: Internal Medicine

## 2010-12-31 ENCOUNTER — Encounter: Payer: Self-pay | Admitting: Internal Medicine

## 2011-01-05 LAB — APTT: aPTT: 32 s (ref 24–37)

## 2011-01-05 LAB — CBC
Platelets: 212 10*3/uL (ref 150–400)
RBC: 4.62 MIL/uL (ref 3.87–5.11)
WBC: 7.8 10*3/uL (ref 4.0–10.5)

## 2011-01-05 LAB — BASIC METABOLIC PANEL
Calcium: 9.9 mg/dL (ref 8.4–10.5)
Chloride: 103 mEq/L (ref 96–112)
Creatinine, Ser: 1.24 mg/dL — ABNORMAL HIGH (ref 0.4–1.2)
GFR calc Af Amer: 52 mL/min — ABNORMAL LOW (ref >60)
GFR calc non Af Amer: 43 mL/min — ABNORMAL LOW (ref >60)

## 2011-01-05 LAB — PROTIME-INR
INR: 0.96 (ref 0.00–1.49)
Prothrombin Time: 13 seconds (ref 11.6–15.2)

## 2011-01-05 LAB — DIFFERENTIAL
Lymphocytes Relative: 37 % (ref 12–46)
Lymphs Abs: 2.9 10*3/uL (ref 0.7–4.0)
Neutrophils Relative %: 52 % (ref 43–77)

## 2011-01-06 LAB — DIFFERENTIAL
Basophils Relative: 1 % (ref 0–1)
Monocytes Relative: 8 % (ref 3–12)
Neutro Abs: 2.8 10*3/uL (ref 1.7–7.7)
Neutrophils Relative %: 55 % (ref 43–77)

## 2011-01-06 LAB — PROTIME-INR
INR: 0.96 (ref 0.00–1.49)
Prothrombin Time: 13 seconds (ref 11.6–15.2)

## 2011-01-06 LAB — CBC
Hemoglobin: 14.4 g/dL (ref 12.0–15.0)
MCHC: 34.7 g/dL (ref 30.0–36.0)
Platelets: 196 10*3/uL (ref 150–400)
RBC: 4.47 MIL/uL (ref 3.87–5.11)

## 2011-01-06 LAB — BASIC METABOLIC PANEL
Calcium: 9.5 mg/dL (ref 8.4–10.5)
GFR calc Af Amer: 45 mL/min — ABNORMAL LOW (ref >60)
GFR calc non Af Amer: 37 mL/min — ABNORMAL LOW (ref >60)
Glucose, Bld: 105 mg/dL — ABNORMAL HIGH (ref 70–99)
Sodium: 141 mEq/L (ref 135–145)

## 2011-01-06 LAB — APTT: aPTT: 30 seconds (ref 24–37)

## 2011-01-06 NOTE — Letter (Signed)
Summary: INSTRUCTION FOR TCS  INSTRUCTION FOR TCS   Imported By: Hoy Morn 12/31/2010 10:33:20  _____________________________________________________________________  External Attachment:    Type:   Image     Comment:   External Document

## 2011-01-06 NOTE — Letter (Signed)
Summary: TCS TRIAGE  TCS TRIAGE   Imported By: Hoy Morn 12/30/2010 14:10:23  _____________________________________________________________________  External Attachment:    Type:   Image     Comment:   External Document  Appended Document: TCS TRIAGE ok as is  Appended Document: TCS TRIAGE Rx and instructions mailed.

## 2011-01-10 LAB — URINE CULTURE
Colony Count: 90000
Culture  Setup Time: 201107171948

## 2011-01-10 LAB — URINALYSIS, ROUTINE W REFLEX MICROSCOPIC
Bilirubin Urine: NEGATIVE
Nitrite: NEGATIVE
Protein, ur: NEGATIVE mg/dL
Specific Gravity, Urine: 1.025 (ref 1.005–1.030)
Urobilinogen, UA: 0.2 mg/dL (ref 0.0–1.0)

## 2011-01-10 LAB — URINE MICROSCOPIC-ADD ON

## 2011-01-12 LAB — URINALYSIS, ROUTINE W REFLEX MICROSCOPIC
Bilirubin Urine: NEGATIVE
Glucose, UA: NEGATIVE mg/dL
Hgb urine dipstick: NEGATIVE
Ketones, ur: NEGATIVE mg/dL
Protein, ur: NEGATIVE mg/dL
pH: 7 (ref 5.0–8.0)

## 2011-01-19 ENCOUNTER — Ambulatory Visit (HOSPITAL_COMMUNITY)
Admission: RE | Admit: 2011-01-19 | Discharge: 2011-01-19 | Disposition: A | Discharge status: Home / Self Care | Payer: Medicare Other | Source: Ambulatory Visit | Attending: Internal Medicine | Admitting: Internal Medicine

## 2011-01-19 ENCOUNTER — Encounter: Payer: Medicare Other | Admitting: Internal Medicine

## 2011-01-19 ENCOUNTER — Other Ambulatory Visit: Payer: Self-pay | Admitting: Internal Medicine

## 2011-01-19 DIAGNOSIS — B20 Human immunodeficiency virus [HIV] disease: Secondary | ICD-10-CM | POA: Insufficient documentation

## 2011-01-19 DIAGNOSIS — Z79899 Other long term (current) drug therapy: Secondary | ICD-10-CM | POA: Insufficient documentation

## 2011-01-19 DIAGNOSIS — K921 Melena: Secondary | ICD-10-CM

## 2011-01-19 DIAGNOSIS — K648 Other hemorrhoids: Secondary | ICD-10-CM | POA: Insufficient documentation

## 2011-01-19 DIAGNOSIS — D126 Benign neoplasm of colon, unspecified: Secondary | ICD-10-CM | POA: Insufficient documentation

## 2011-01-19 DIAGNOSIS — K644 Residual hemorrhoidal skin tags: Secondary | ICD-10-CM | POA: Insufficient documentation

## 2011-01-19 DIAGNOSIS — K261 Acute duodenal ulcer with perforation: Secondary | ICD-10-CM

## 2011-01-19 DIAGNOSIS — E785 Hyperlipidemia, unspecified: Secondary | ICD-10-CM | POA: Insufficient documentation

## 2011-01-19 DIAGNOSIS — I10 Essential (primary) hypertension: Secondary | ICD-10-CM | POA: Insufficient documentation

## 2011-01-19 DIAGNOSIS — K5730 Diverticulosis of large intestine without perforation or abscess without bleeding: Secondary | ICD-10-CM | POA: Insufficient documentation

## 2011-01-19 HISTORY — PX: COLONOSCOPY: SHX174

## 2011-01-20 NOTE — Op Note (Signed)
  NAMESHIRLY, GRAZIANO               ACCOUNT NO.:  [id number]  MEDICAL RECORD NO.:  [id number]           PATIENT TYPE:  O  LOCATION:  DAYP                          FACILITY:  [facility]  PHYSICIAN:  R. Garfield Cornea, M.D. DATE OF BIRTH:  05/07/1941  DATE OF PROCEDURE:  01/19/2011 DATE OF DISCHARGE:                              OPERATIVE REPORT   PROCEDURE:  A colonoscopy with snare polypectomy.  INDICATIONS FOR PROCEDURE:  The patient is a 70 year old lady with HIV who underwent screening colonoscopy.  Low-volume intermittent hematochezia from time to time.  Colonoscopy was attempted back in January, poor prep precluded complete exam.  She is here for repeat attempt.  Risks, benefits, limitations, alternatives, imponderables have been discussed, questions answered.  Please see the documentation in the medical record.  PROCEDURE NOTE:  O2 saturation, blood pressure, pulse, respirations were monitored throughout the entire procedure.  CONSCIOUS SEDATION:  Versed 6 mg IV, Demerol 125 mg IV in divided doses.  INSTRUMENT:  Pentax video chip system.  FINDINGS:  Digital rectal exam revealed external hemorrhoidal tags. Endoscopic findings:  Prep was again poor, but we were able to get through the colon to the cecum.  Examination of the colonic mucosa was undertaken from the rectosigmoid junction through the left transverse right colon, appendiceal orifice, ileocecal valve/cecum.  These structures were seen and photographed for the record.  From this level, scope was slowly and cautiously withdrawn.  All previously mentioned mucosal surfaces were again seen.  The patient had scattered left-sided diverticula.  There was a diminutive polyp in the base of the cecum which was cold snared, recovered.  The remainder of colonic mucosa appeared unremarkable, although formed stool in the right colon made exam more difficult, small lesion may have been obscured.  The scope was pulled down to the  rectum where a thorough examination of rectal mucosa including retroflexed view of the anal verge demonstrated anal papilla and internal hemorrhoids only.  The patient tolerated the procedure well.  Cecal withdrawal time 9 minutes.  IMPRESSION: 1. Internal and external hemorrhoids, likely source of hematochezia,     anal papilla, otherwise normal rectal mucosa. 2. Left-sided diverticula.  Cecal polyp with status post cold snare     polypectomy.  Remainder of colonic mucosa appeared normal.  RECOMMENDATIONS: 1. Diverticulosis polyp and hemorrhoid literature provided to Ms.     Flower. 2. Daily fiber supplement. 3. A 10-day course of Anusol-HC Suppository one per rectum at bedtime. 4. Follow up on path. 5. Further recommendations to follow.     Bridgette Habermann, M.D.     RMR/MEDQ  D:  01/19/2011  T:  01/19/2011  Job:  [id number]  cc:   Claiborne Billings L. [name], M.D. [phone]  Electronically Signed by Jannette Spanner M.D. on 01/20/2011 11:25:59 AM

## 2011-01-28 LAB — T-HELPER CELL (CD4) - (RCID CLINIC ONLY)
CD4 % Helper T Cell: 28 % — ABNORMAL LOW (ref 33–55)
CD4 T Cell Abs: 750 uL (ref 400–2700)

## 2011-01-31 LAB — T-HELPER CELL (CD4) - (RCID CLINIC ONLY): CD4 T Cell Abs: 840 uL (ref 400–2700)

## 2011-02-03 LAB — T-HELPER CELL (CD4) - (RCID CLINIC ONLY)
CD4 % Helper T Cell: 28 % — ABNORMAL LOW (ref 33–55)
CD4 T Cell Abs: 810 uL (ref 400–2700)

## 2011-03-09 ENCOUNTER — Emergency Department (HOSPITAL_COMMUNITY)
Admission: EM | Admit: 2011-03-09 | Discharge: 2011-03-09 | Disposition: A | Discharge status: Home / Self Care | Payer: Medicare Other | Attending: Emergency Medicine | Admitting: Emergency Medicine

## 2011-03-09 DIAGNOSIS — Z21 Asymptomatic human immunodeficiency virus [HIV] infection status: Secondary | ICD-10-CM | POA: Insufficient documentation

## 2011-03-09 DIAGNOSIS — N390 Urinary tract infection, site not specified: Secondary | ICD-10-CM | POA: Insufficient documentation

## 2011-03-09 DIAGNOSIS — Z79899 Other long term (current) drug therapy: Secondary | ICD-10-CM | POA: Insufficient documentation

## 2011-03-09 DIAGNOSIS — Z8673 Personal history of transient ischemic attack (TIA), and cerebral infarction without residual deficits: Secondary | ICD-10-CM | POA: Insufficient documentation

## 2011-03-09 LAB — URINALYSIS, ROUTINE W REFLEX MICROSCOPIC
Glucose, UA: 100 mg/dL — AB
Ketones, ur: NEGATIVE mg/dL
Protein, ur: 100 mg/dL — AB
pH: 5.5 (ref 5.0–8.0)

## 2011-03-09 LAB — URINE MICROSCOPIC-ADD ON

## 2011-03-10 NOTE — Assessment & Plan Note (Signed)
NAMEMarland Kitchen  Tanya, [name]                CHART#:  [id number]   DATE:  03/26/2008                       DOB:  [birth year]   REQUESTING PHYSICIAN:  Dr. Melina Copa   REASON FOR CONSULTATION:  Left lower quadrant severe pain and diarrhea.   HISTORY OF PRESENT ILLNESS:  [name] is a 70 year old Caucasian  female.  She has a 1-year history of intermittent severe left lower  quadrant pain.  At times, the pain doubles her over and makes her cry.  She has been seen at [facility] as well as at [facility]  and been evaluated by Dr. Amedeo Plenty at [facility] for these  problems previously.  She complains of nocturnal incontinence, as well  as incontinence throughout the day.  She has anywhere between 4 and 5  loose stools per day.  This has been intermittent along with  constipation throughout several years, but however the last 2 weeks, she  has had nothing but diarrhea.  She denies any fever, does have some  chills.  She has noticed a small amount of bright red blood with wiping.  Denies any melena.  She describes the pain as labor pains.  The pain  has been persistent.  She had a CT of the abdomen and pelvis without  contrast on February 22, 2008, which showed a stable left adrenal adenoma,  postoperative change in the lower lumbar spine.  A few diverticula and  had an inguinal left hernia containing fat.  She had acute abdominal  films, which were normal.  She had blood work on February 22, 2008, which  showed a creatinine of 1.5, a chloride of 113, otherwise normal CMP.  She had a normal CBC.  She has normal ferritin of 139, iron 64, TIBC  308, percent saturation 21, and UIBC 244.  She had a CD4 count of 530  and a T-helper percentage of 20.  She is HIV positive and undergoing  treatments through Dr. Hayes Ludwig in [place].   PAST MEDICAL AND SURGICAL HISTORY:  HIV positive, chronic renal  insufficiency, depression, CVA, status post cholecystectomy,  hysterectomy, bladder  tack, multiple blood transfusions, erosive antral  gastritis, and esophageal dilatation by Dr. Laural Golden on December 25, 2002 and  colonoscopy by Dr. Laural Golden August 10, 2002, which were normal except for  hemorrhoids and erythema of the dentate line.   CURRENT MEDICATIONS:  Ativan, unknown dose daily.  She did not bring her  HIV medication list with her.  She is going to call back with this.  She  is on vitamin D 50,000 units weekly, calcium daily, vitamin E daily, and  Neurontin unknown dose daily.   ALLERGIES:  HALDOL, NOVOCAIN, THORAZINE, COMPAZINE, NUBAIN, MORPHINE,  and CODEINE.   FAMILY HISTORY:  There is no known family history of colorectal  carcinoma, liver, or chronic GI problems.  Both parents are deceased.  Mother at age 47, secondary to coronary artery disease and diabetes  mellitus.  Father deceased at 27 secondary to CVA.   SOCIAL HISTORY:  [name] is a widow.  She does live with a roommate.  She has 3 healthy children.  She is disabled previous Psychologist, counselling.  Denies any tobacco, alcohol, or drug use.   REVIEW OF SYSTEMS:  See HPI; otherwise negative.   PHYSICAL EXAMINATION:  VITAL SIGNS:  Weight 126 pounds, height is 64  inches, temperature 98.1, blood pressure 118/82, and pulse is 72.  GENERAL:  [name] is an elderly Caucasian female who is alert,  oriented, pleasant, and cooperative, in no acute distress.HEENT:  Sclerae clear, nonicteric.  Conjunctivae are pink.  Oropharynx pink and  moist without any lesions.NECK:  Supple without any masses or  thyromegaly.  She does have palpable fullness just above the left  clavicle.CHEST:  Regular rate and rhythm.  Normal S1 and S2 without  murmurs, clicks, rubs, or gallops.  LUNGS:  Clear to auscultation bilaterally.ABDOMEN:  Positive bowel  sounds x4.  No bruits auscultated.  Soft, nontender, and nondistended  without palpable mass or hepatosplenomegaly.  No rebound tenderness or  guarding.EXTREMITIES:  With 1+ right  lower extremity edema and trace  left lower extremity edema.   IMPRESSION:  [name] is a 70 year old female with a 1-year history  of intermittent left lower quadrant pain.  More recently over the last 2  weeks, she has had persistent diarrhea, as well as left lower quadrant  pain, and incontinence including nocturnal incontinence.  A recent CT  was reassuring.  There was no evidence of adenopathy or colitis on CT.  She does have some diverticulosis.  She could have infectious etiology  for her diarrhea given her immunocompromised state and the fact that she  is HIV positive.  It is possible she could also have irritable bowel  syndrome or microscopic colitis.   PLAN:  1. She is going to call back with her complete list of medications and      when she does, we will see if she is going to be able to have a      trial of antispasmodic such as Levsin.  2. A full set of stool studies to include culture and sensitivity, C.      Diff, ova and parasite and lactoferrin.  3. We will request recent colonoscopy and EGD report from Dr. Amedeo Plenty'      office in [place].  4. Further recommendations pending complete review of records.   Thank you Dr. Melina Copa for allowing [place] to participate in the care of [name].       Vickey Huger, N.P.  Electronically Signed     Caro Hight, M.D.  Electronically Signed    KJ/MEDQ  D:  03/26/2008  T:  03/27/2008  Job:  [id number]   cc:   Octavio Graves

## 2011-03-11 ENCOUNTER — Ambulatory Visit (INDEPENDENT_AMBULATORY_CARE_PROVIDER_SITE_OTHER): Payer: Medicaid Other | Admitting: Adult Health

## 2011-03-11 ENCOUNTER — Encounter: Payer: Self-pay | Admitting: Adult Health

## 2011-03-11 DIAGNOSIS — B20 Human immunodeficiency virus [HIV] disease: Secondary | ICD-10-CM

## 2011-03-11 DIAGNOSIS — G609 Hereditary and idiopathic neuropathy, unspecified: Secondary | ICD-10-CM

## 2011-03-11 DIAGNOSIS — Z113 Encounter for screening for infections with a predominantly sexual mode of transmission: Secondary | ICD-10-CM

## 2011-03-11 DIAGNOSIS — E785 Hyperlipidemia, unspecified: Secondary | ICD-10-CM

## 2011-03-11 DIAGNOSIS — Z79899 Other long term (current) drug therapy: Secondary | ICD-10-CM

## 2011-03-11 DIAGNOSIS — N189 Chronic kidney disease, unspecified: Secondary | ICD-10-CM

## 2011-03-11 DIAGNOSIS — Z21 Asymptomatic human immunodeficiency virus [HIV] infection status: Secondary | ICD-10-CM

## 2011-03-11 LAB — URINE CULTURE
Colony Count: >=100000
Culture  Setup Time: 201205141405

## 2011-03-11 MED ORDER — LAMIVUDINE 150 MG PO TABS: 150.0000 mg | ORAL_TABLET | Freq: Every day | ORAL | Status: DC

## 2011-03-12 LAB — LIPID PANEL: Cholesterol: 242 mg/dL — ABNORMAL HIGH (ref 0–200)

## 2011-03-12 LAB — COMPREHENSIVE METABOLIC PANEL
ALT: 24 U/L (ref 0–35)
AST: 15 U/L (ref 0–37)
Albumin: 4.6 g/dL (ref 3.5–5.2)
Alkaline Phosphatase: 63 U/L (ref 39–117)
BUN: 22 mg/dL (ref 6–23)
Calcium: 9.5 mg/dL (ref 8.4–10.5)
Chloride: 107 mEq/L (ref 96–112)
Potassium: 4.1 mEq/L (ref 3.5–5.3)
Sodium: 141 mEq/L (ref 135–145)

## 2011-03-12 LAB — CBC WITH DIFFERENTIAL/PLATELET
Basophils Absolute: 0 10*3/uL (ref 0.0–0.1)
Basophils Relative: 1 % (ref 0–1)
HCT: 41.6 % (ref 36.0–46.0)
Lymphocytes Relative: 47 % — ABNORMAL HIGH (ref 12–46)
MCHC: 32.9 g/dL (ref 30.0–36.0)
Monocytes Absolute: 0.6 10*3/uL (ref 0.1–1.0)
Neutro Abs: 2.8 10*3/uL (ref 1.7–7.7)
Neutrophils Relative %: 41 % — ABNORMAL LOW (ref 43–77)
Platelets: 199 10*3/uL (ref 150–400)
RDW: 13 % (ref 11.5–15.5)
WBC: 6.9 10*3/uL (ref 4.0–10.5)

## 2011-03-12 LAB — HIV-1 RNA QUANT-NO REFLEX-BLD: HIV 1 RNA Quant: <20 copies/mL (ref <20)

## 2011-03-13 LAB — T-HELPER CELL (CD4) - (RCID CLINIC ONLY)
CD4 % Helper T Cell: 28 % — ABNORMAL LOW (ref 33–55)
CD4 T Cell Abs: 970 uL (ref 400–2700)

## 2011-03-13 NOTE — Consult Note (Signed)
NAMEJANYNE, [name]               ACCOUNT NO.:  [id number]   MEDICAL RECORD NO.:  [id number]          PATIENT TYPE:  INP   LOCATION:  0103                         FACILITY:  [facility]   PHYSICIAN:  James L. Oletta Lamas, M.D. DATE OF BIRTH:  02/01/1941   DATE OF CONSULTATION:  DATE OF DISCHARGE:                                 CONSULTATION   We were asked to see Ms. Orchard today by the [facility] Team C.  She is  a patient of Dr.  Amedeo Plenty of the [facility].   HISTORY OF PRESENT ILLNESS:  Ms. Goulette is a 70 year old female with a  history of HIV contracted from blood transfusions many years ago.  She  has been complaining of nausea, vomiting, and bloody diarrhea x2 days.  She has no blood in her vomit.  The vomit is green and yellow mucus.  She last vomited Wednesday morning on February 13.  Her diarrhea with  blood started on Wednesday at approximately noon and continues now.  It  is clear liquid with small balls of light red stool in it.  I witnessed  it in the commode.  The patient is very nauseated today.  She has severe  right upper abdominal pain as well as left lower quadrant pain, and she  states that she has been complaining of 3-4 months of mucous and blood  in her stool each day.   PAST MEDICAL HISTORY:  1. HIV.  She has a CD4 count of 320 on November 02, 2006.  2. She was hospitalized in January with nausea, vomiting, and      diarrhea.  This was attributed to HIV medications.  Her HIV is      treated by Dr. Orvan Seen.  3. Chronic renal insufficiency.  She is a patient [facility] Dr. Corliss Parish.  4. Anemia of chronic disease.  5. Coagulopathy secondary to platelet disorder.  6. Anxiety and depression.  7. Transient ischemic attack.  8. She has also had a hysterectomy, a laminectomy, and a      foraminectomy.  She has had an EGD with dilatation in 2004 by Dr.      Laural Golden.  It showed erosive gastritis.  She was also dilated with a  58-French Maloney dilator.  She had an EGD with dilatation in 2003      also by Dr. Laural Golden that showed gastritis.  Her most recent EGD was      by Dr. Teena Irani in January 2008 for abdominal pain.  It was      normal.  Her colonoscopy in 2003 was for hematochezia, diarrhea,      and constipation.  It was normal but showed internal and external      hemorrhoids.   CURRENT MEDICATIONS:  1. Protonix 40 mg daily.  2. Lorazepam.  3. The patient states she is no longer on Retrovir after her      hospitalization last month.   ALLERGIES:  CODEINE, COMPAZINE, HALDOL, MORPHINE, NUBAIN, THORAZINE,  PHENERGAN, AND OTHER.   FAMILY HISTORY:  She has a son with  diverticulitis, and she has uncle  who passed with colon cancer.   PHYSICAL EXAMINATION:  GENERAL:  She is alert and oriented in no  apparent distress.  HEART:  Regular rate and rhythm.  LUNGS:  Clear to auscultation.  VITAL SIGNS:  Temperature is 99.  Pulse is 90.  Respirations are 20.  Blood pressure is 146/51.  ABDOMEN:  Soft.  It is tender in the right upper quadrant as well as a  left lower quadrant.  RECTAL:  She has both internal and external hemorrhoids but no obvious  blood.  She is definitively guaiac positive.   LABS:  Sodium 138, potassium 4.5, chloride 107, bicarb 20.  BUN is 28.  Creatinine is 2.88.  Glucose is 130.  Hemoglobin is 10.8.  That is down  from 11.7 yesterday.  Could be attributed to IV hydration.  Hematocrit  31.4, white blood count 9.7, platelets 184,000.  Her PT count yesterday  was 14.1, INR 1.1, PTT 37, amylase 54, lipase 31.  CT scan done  yesterday showed thickening of her left half of her transverse colon as  well as her proximal descending colon colitis.   ASSESSMENT:  Dr. Laurence Spates has seen and examined the patient and  states that:   1. She has multiple medical problems including chronic renal      insufficiency and being human immunodeficiency virus positive.  2. She currently have  infectious versus inflammatory colitis with      rectal bleeding.   Plan per Dr. Oletta Lamas is that the patient will receive Cipro as well as  Flagyl and be scheduled for colonoscopy tomorrow at 11:30 [facility].      Melton Alar, PA    ______________________________  Joyice Faster Oletta Lamas, M.D.    MLY/MEDQ  D:  12/09/2006  T:  12/09/2006  Job:  [id number]   cc:   Jeneen Rinks L. Rolla Flatten., M.D.  Fax: [phone]   Bertie C   Orvan Seen, Dr.

## 2011-03-13 NOTE — Consult Note (Signed)
NAME:  [name], [name] NO.:  [id number]   MEDICAL RECORD NO.:  [id number]                  PATIENT TYPE:   LOCATION:                                       FACILITY:   PHYSICIAN:  Hildred Laser, M.D.                 DATE OF BIRTH:   DATE OF CONSULTATION:  08/04/2002  DATE OF DISCHARGE:                                   CONSULTATION   REASON FOR CONSULTATION:  Hematochezia, colonoscopy.   HISTORY OF PRESENT ILLNESS:  The patient is a pleasant 70 year old Caucasian  female, patient of Dr. Carmie End, who presents today for further evaluation  of hematochezia, change in bowel habits, colonoscopy.  The patient is HIV  positive. She was diagnosed in 1998, at which time she presented with  Pneumocystis carinii pneumonia. She was in the hospital for three months and  was actually on life support as well. At that time, her CD4 count was in the  20s. She tells me her CD4 count now runs in the 700s. She is followed by Dr.  Arelia Longest. [name] with the infectious disease clinic in [place]. The  patient tells me the last several months she has been having a lot of lower  abdominal cramps. Nothing seems to makes the cramps get worse or better. Her  symptoms are gradually worsening. She has also noted change in her bowel  habits. She has had some history of alternating constipation in the past,  but over the last several months, she has been having devastating  symptoms.  She will go for a week at a time without a bowel movement and  then will have diarrhea for several days in a row. She will have upward to  ten stools daily. She note bright red blood on the toilet tissue with wiping  at times. She states she has hemorrhoids. She denies any fever. She has had  some lower back pain with radiation into her right hip. She denies any  nausea or vomiting or heartburn symptoms. She does have some problems  swallowing solids and liquids. She feels like the food goes down  very  slowly. She also has strangling episodes with drinking liquids. When she  lies on her back, she often begins to choke. Denies any dysuria, urinary  frequency, hematuria. She has a history of urinary tract infections,  however, She denies any melena. She has never had a colonoscopy. Denies any  NSAID or aspirin use.   CURRENT MEDICATIONS:  1. Viracept five tablets b.i.d.  2. Epivir two tablets q.d.  3. Viramune two tablets q.d.  4. Elavil 100 mg q. h.s.  5. Ativan 2 mg t.i.d.  6. Effexor 75 mg q.d.  7. Zyrtec 10 mg q.d.  8. Vistaril 20 mg q.d.  9. Lasix q.d.   ALLERGIES:  HALDOL, COMPAZINE, CODEINE, THORAZINE, and NAVANE.   PAST MEDICAL HISTORY:  1. She was found to  be HIV positive in 1998, at which time she developed     Pneumocystis carinii pneumonia. She has been on medication ever since.     Currently, she reports CD4 counts being in the 700 range. No other     opportunistic infections reported. Source of infection felt to be from     previous blood transfusions in the 60s or in 1985.  2. History of depression and anxiety.  3. History of hypertension.  4. TIA.  5. Asthmatic bronchitis.   PAST SURGICAL HISTORY:  1. She has history of a miscarriage in 1952, at which time she received     blood transfusions.  2. Tubal ligation.  3. Bladder tack.  4. Hysterectomy in 1985, for vaginal hemorrhage. At that time, this was     complicated with aspiration pneumonia and coma.  5. Cholecystectomy.   FAMILY HISTORY:  Mother died of heart disease, diabetes mellitus. Father  died of stroke. She has a sister with Crohn's disease. She has a couple of  brothers who have slight liver disease due to alcohol use. Had a brother  with eye cancer. No family history of colorectal cancer.   SOCIAL HISTORY:  She is widowed. She has three sons. She takes care of a  friend who has schizophrenia. She moved back from [place] three years ago.  She is disabled. She has never been a smoker.  Denies any alcohol use, drug  use, or tattoos.   REVIEW OF SYMPTOMS:  Please see HPI for GI and for musculoskeletal. GENERAL:  Complains of weight gain about 25 pounds in the last several months.  CARDIOPULMONARY:  Denies any chest pain, shortness of breath.   PHYSICAL EXAMINATION:  VITAL SIGNS:  Weight 201.5, height 5 feet 5 inches.  Temperature 97.3, blood pressure 130/94, pulse 76.  GENERAL:  Pleasant, mildly obese Caucasian female in no acute distress.  SKIN:  Warm and dry, no jaundice.  HEENT:  Pupils are equal, round, and reactive to light. Conjunctivae are  pink. Sclerae are nonicteric. Oropharyngeal mucosa moist and pink. No  lesions, erythema, or exudate. No lymphadenopathy, thyromegaly, or carotid  bruits.  CHEST:  Lungs clear to auscultation.  CARDIAC:  Reveals regular, rate, and rhythm; normal S1 and S2; no murmurs,  rubs, or gallops.  ABDOMEN:  Positive bowel sounds, slightly obese but symmetrical, nontender,  no organomegaly or masses.  RECTAL:  Deferred to the time of colonoscopy.  EXTREMITIES:  No cyanosis or edema.   LABORATORY DATA:  On 01/13/02, glucose 102, BUN 21, creatinine 0.9. White  count 6.8, hemoglobin 12.9, hematocrit 37, platelets 264,000.   IMPRESSION:  The patient is a pleasant 70 year old lady who has a several  month history of lower abdominal pain, alternating constipation, diarrhea,  hematochezia, and dysphagia. Her lower abdominal cramps do not appear to be  associated with her bowel habits. She denies any cramping followed by  stools. The pain comes and goes with no notable modifying factors. Denies  any urinary symptoms related to this. At this point, the etiology of her  left lower abdominal pain is unclear. She may be having some spastic colon  or pain due to adhesive disease. She complains of hematochezia which relates  to her hemorrhoids. It sounds like she is bleeding from a benign anorectal source; however, we cannot rule out polyps,  ulcers, or cancer.   Given her change in bowel habits and history of hematochezia, we have  decided she should undergo colonoscopy for further evaluation. She also  reports dysphagia as well as some oropharyngeal dysphagia type symptoms. We  will proceed with EGD with possible dilatation at time of colonoscopy.  Regarding change in bowel habits, she is having alternating constipation  which sounds typical with IBS. Cannot rule out colonic strictures or  obstructions. Does not sound to be infectious in etiology, however.   PLAN:  1. Colonoscopy, EGD plus dilatation in the near future.  2. The patient requests help loosing weight. Will get an appointment with a     nutritionist at the hospital for evaluation for possible weight loss.  3. Further recommendations to follow.   I would like to thank Dr. Melina Copa for allowing [place] to take part in the care of  this patient.     Neil Crouch, P.A.-C                      Hildred Laser, M.D.    LL/MEDQ  D:  08/04/2002  T:  08/04/2002  Job:  [id number]   cc:   Lysle Morales, M.D.  [id number]  [place]  [place] [address]  Fax: [phone]

## 2011-03-13 NOTE — Discharge Summary (Signed)
[name], [name]               ACCOUNT NO.:  [id number]   MEDICAL RECORD NO.:  [id number]          PATIENT TYPE:  INP   LOCATION:  [id number]                         FACILITY:  [facility]   PHYSICIAN:  Aquilla Hacker, M.D. DATE OF BIRTH:  27-Mar-1941   DATE OF ADMISSION:  12/08/2006  DATE OF DISCHARGE:  12/13/2006                               DISCHARGE SUMMARY   FINAL DIAGNOSES:  1. Hematochezia.  2. Abdominal pain.  3. Nausea and vomiting.  4. Renal insufficiency.  5  Acute blood loss anemia.  1. Hypokalemia.   PROCEDURES:  1. Abdominal ultrasound completed December 08, 2006.  2. A CT scan of the abdomen and pelvis completed December 09, 2006.  3. Chest X-Ray: Two views completed December 09, 2006.  4. Colonoscopy completed December 10, 2006.   CONSULTATIONS:  1. Vernie Ammons M.D.; gastroenterology  2. Arelia Longest. [name], M.D., infectious disease.   HISTORY OF PRESENT ILLNESS:  [name] is a 70 year old female with a  past medical history of HIV positivity, who arrived complaining of  bloody diarrhea for 1 day accompanied by abdominal pain, as well as  nausea and vomiting.  She described the pain as being 10/10 in  intensity.  She experienced 3 episodes of vomiting a greenish-yellow  material.  She described a large amount of blood being present in her  stool, which was watery in content.  She had experienced at least 4  episodes of bloody diarrhea prior to coming to the hospital.  For past  medical history please see that dictated by Dr. Gonzella Lex.   HOSPITAL COURSE:  #1 - BLOODY DIARRHEA/HEMATOCHEZIA.  An abdominal  ultrasound was completed on December 08, 2006, which revealed no biliary  ductal dilatation, status post cholecystectomy.  Mild cortical thinning  involving both kidneys was seen, but it was unchanged compared to  ultrasound completed 1 month ago.  Otherwise, the abdominal ultrasound  was stated to be normal.  A CT scan of the patient's abdomen and  pelvis  was completed on December 09, 2006 without contrast.  A CT scan of the  abdomen revealed no acute findings.  A left adrenal adenoma was seen.  CT scan of the pelvis revealed thickening of the colon, predominantly in  the left one-half of the transverse colon and proximal descending colon,  consistent with colitis (either inflammatory or infectious).  Gastroenterology was consulted.  Dr. Laurence Spates performed a  colonoscopy on December 10, 2006.  He discovered diffuse colitis in the  transverse and descending colon.  He stated that findings were not  entirely typical of ischemic colitis or HIV disease.  Biopsies revealed  mild active mucosal colitis associated with exudate.  The differential  included ischemic colitis versus pseudomembranous colitis.  The patient  was started on Cipro and Flagyl over the course of her hospitalization,  and by the day of discharge her abdominal pain and hematochezia  resolved.   #2 - NAUSEA AND VOMITING.  The patient was provided with p.r.n.  antiemetics over the course of hospitalization.  These symptoms  resolved.   #3 - RENAL INSUFFICIENCY.  The patient's BUN was 28.  Her creatinine  2.88 at the time of admission.  The patient did have a history of  chronic renal insufficiency.  IV fluid hydration was provided to the  patient during the course of her hospitalization.  By the day of  discharge these numbers remained relatively stable, although still  elevated.   #4 - ACUTE BLOOD LOSS ANEMIA.  The patient's hemoglobin at the time of  admission was noted to have been 11.7, with a hematocrit of 34.5.  These  numbers did drop over the course of her hospitalization,  to a low of  9.3 and 27.2 by December 11, 2006.  By the day of discharge February 18,  the patient's hemoglobin was 10.1 and her hematocrit 29.4.   #5 - HYPOKALEMIA.  The patient did have a potassium level of 3.4 on  February 18.  For this she received supplementation.   By the  date of discharge the patient indicated that she felt better.  Her abdominal pain was better.  She had no additional complaints.  The  temperature was 98.7, heart rate 86, respirations 18, blood pressure  132/69, O2 saturations 97% on room air.   The patient was discharged home on Protonix 40 mg p.o. daily.  She was  told that it was okay to resume her prior home medications and to take a  low residue diet.   In addition, infectious disease did see the patient during the course of  hospitalization.  Dr. Everlene Balls indicated that a report of the  patient's HLAB 5701 assay early in the week and it was found to be  negative; such that it would be possible to start the patient on  abacavir as part of her next HAART regimen.      Aquilla Hacker, M.D.  Electronically Signed     OR/MEDQ  D:  02/15/2007  T:  02/16/2007  Job:  [id number]

## 2011-03-13 NOTE — Op Note (Signed)
NAME:  [name], [name]                         ACCOUNT NO.:  [id number]   MEDICAL RECORD NO.:  [id number]                   PATIENT TYPE:  AMB   LOCATION:  DAY                                  FACILITY:  [facility]   PHYSICIAN:  Hildred Laser, M.D.                 DATE OF BIRTH:  Sep 10, 1941   DATE OF PROCEDURE:  DATE OF DISCHARGE:                                 OPERATIVE REPORT   PROCEDURE:  Esophagogastroduodenoscopy with esophageal dilatation followed  by total colonoscopy.   ENDOSCOPIST:  Hildred Laser, M.D.   INDICATIONS:  This patient is a 70 year old Caucasian female with a history  of HIV who is in remission with therapy.  She has been having intermittent  solid food dysphagia. She also has lower abdominal pain and alternating  diarrhea and constipation and hematochezia.  She is, therefore, undergoing  these studies.  The procedure and risks were reviewed with the patient and  informed consent was obtained.   PREOPERATIVE MEDICATIONS:  Cetacaine spray for pharyngeal topical  anesthesia, Demerol 75 mg IV and Versed 10 mg IV in divided dose.   INSTRUMENT:  Olympus video system.   FINDINGS:   PROCEDURE #1: ESOPHAGOGASTRODUODENOSCOPY:  The patient's vital signs and O2  saturation were monitored during the procedure and remained stable.  She was  placed in the left lateral recumbent position and endoscope was passed via  the oropharynx without any difficulty into the esophagus.   ESOPHAGUS:  Mucosa of the esophagus was normal.  Squamocolumnar junction was  normal other than erythema on the gastric side.  There was no ring,  stricture, or hernia.   STOMACH:  It was empty and distended very well with insufflation.  The folds  of the proximal stomach were normal.  Examination of the mucosa revealed  linear streaks of erythema at antrum converging onto the pylorus.  No  erosions or ulcers were noted.  The pyloric channel was patent.  Angularis  and fundus were examined by  retroflexing the scope and were normal.   DUODENUM:  Examination of the bulb; and second and third part of the  duodenum was normal.   Endoscope was withdrawn and esophageal dilatation was performed by passing a  56 French Maloney dilator through the esophagus completely.  As the dilator  was removed the endoscope was passed again and there was no mucosal injury  noted to the esophagus.   Endoscope was withdrawn and the patient was prepared for total colonoscopy.   PROCEDURE #2: TOTAL COLONOSCOPY:  Rectal examination performed.  No  abnormality noted on external or digital exam.  The scope was placed in the  rectum and advanced under vision into the sigmoid colon and beyond.  Preparation was satisfactory.  Using abdominal pressure and different  positions, I was able to advance the scope into the cecum which was  identified by ileocecal valve and appendiceal orifice.  Pictures taken for  the record.  As the scope was withdrawn colonic mucosa was carefully  examined.  There were no polyps, tumor masses, or diverticular changes. The  mucosal vasculature pattern were normal throughout.  Rectal mucosa was  normal.   The scope was retroflexed to examine the anorectal junction.  She had  erythema at the dentate line and hemorrhoids below the dentate line.  The  endoscope was straightened and withdrawn.  The patient tolerated the  procedure well.   FINAL DIAGNOSES:  1. Mild changes of reflux esophagitis limited to gastroesophageal junction.     No evidence of ring, stricture, or esophageal candidiasis.  2. Esophagus dilated by passing a 56 French Maloney dilator given history of     dysphagia.  3. Gastritis, possibly H. pylori induced.  4. Normal colonoscopy except for some hemorrhoids and some erythema at the     dentate line.   RECOMMENDATIONS:  1. Start her on Protonix 40 mg p.o. q.a.m.  2. Antireflux measures reviewed with the patient.  3. Would also suggest that she take  Metamucil or equivalent 1 tablespoonful     daily.  4. H. pylori serology will be checked today.  I will contact the patient     with the results.                                               Hildred Laser, M.D.    NR/MEDQ  D:  08/10/2002  T:  08/10/2002  Job:  [id number]   cc:   Octavio Graves

## 2011-03-13 NOTE — Op Note (Signed)
NAMEARCADIA, [name]               ACCOUNT NO.:  [id number]   MEDICAL RECORD NO.:  [id number]          PATIENT TYPE:  INP   LOCATION:  [place]                         FACILITY:  [facility]   PHYSICIAN:  James L. Rolla Flatten., M.D.DATE OF BIRTH:  08-10-1941   DATE OF PROCEDURE:  12/10/2006  DATE OF DISCHARGE:                               OPERATIVE REPORT   PROCEDURE:  Colonoscopy and biopsy.   MEDICATIONS:  Fentanyl 100 mcg, Versed 10 mg IV.   SCOPE:  Delta Pentax scope.   INDICATION:  HIV positive patient with abdominal pain and bloody  diarrhea.  CT showed diffuse thickening in the colon from the left colon  up around the transverse.  This is done to look for a cause.   DESCRIPTION OF PROCEDURE:  Procedure explained and the patient consent  obtained.  In the left lateral decubitus position, digital exam  performed and the scope inserted.  The prep was excellent.  We reached  the sigmoid and there were multiple submucosal hemorrhage-type areas and  marked edema and swelling without frank ulcerations.  We progressed  further.  There appeared to be more and more erosions with ulcerations.  This persisted to the hepatic flexure.  I did not try to pass the  hepatic flexure.  There were no clear ulcerations.  It was more of a  diffuse edema submucosal hemorrhage and thickening of the mucosa.  Multiple biopsies were obtained and it was extraordinarily friable.  The  scope was withdrawn.  The initial findings were confirmed.  The patient  tolerated the procedure well and was resting comfortably at the  termination of the procedure.   ASSESSMENT:  Diffuse colitis in the transverse and descending colon.  This is not entirely typical of ischemic colitis or her HIV positive  disease.  We need to be concerned about other causes.   PLAN:  We will check the results of the biopsies and continue treatment  conservatively for now.           ______________________________  Joyice Faster. Rolla Flatten.,  M.D.     Jaynie Bream  D:  12/10/2006  T:  12/11/2006  Job:  [id number]   cc:   Chipper Herb, M.D.  Arelia Longest. Michelle Nasuti., M.D.

## 2011-03-13 NOTE — Discharge Summary (Signed)
[name], [name]               ACCOUNT NO.:  [id number]   MEDICAL RECORD NO.:  [id number]          PATIENT TYPE:  INP   LOCATION:  [id number]                         FACILITY:  [facility]   PHYSICIAN:  Luane School, M.D.   DATE OF BIRTH:  04/15/1941   DATE OF ADMISSION:  08/10/2006  DATE OF DISCHARGE:  08/17/2006                               DISCHARGE SUMMARY   DISCHARGE DIAGNOSES:  1. Subacute on chronic renal insufficiency, secondary to biopsy proven      acute tubular necrosis.  2. Nausea and vomiting, secondary to number 1.  3. Metabolic acidosis, secondary to number 1.  4. Anemia, secondary to number 1, status post transfusion.  5. Hypokalemia, secondary to nausea and vomiting.  6. HIV.  7. Coagulopathy with increased bleeding time, most likely secondary to      platelet dysfunction, status post DDAVP.  8. Depression.   DISCHARGE MEDICATIONS:  1. Zetia 10 mg daily.  2. Multivitamin 1 daily.  3. Protonix 40 mg daily.  4. Cardizem 240 mg daily.  5. Synthroid 50 mcg.  6. Claritin 10 mg daily.  7. Amitriptyline 50 mg at bedtime.  8. TriCor 48 mg daily.  9. Flonase spray 2 sprays in each nostril daily.  10.Ativan 1 mg b.i.d. as needed.  11.Cymbalta 30 mg for 10 days, then stop.  12.Aranesp 200 mcg injection once weekly at [facility].   FOLLOWUP:  She will follow up with Dr. [name] at [facility]  on October 25 at 9:15, number is [phone].  She is also to follow up  with Dr. [name] in infectious disease clinic on October 29 at 2:45,  phone number there is [phone].   PROCEDURES:  1. Renal ultrasound, which was negative.  2. Renal biopsy on October 18, which was consistent with ATN.   CONSULTATIONS:  1. Renal (Dr. [name]).  2. Infectious disease, Dr. Melvern Banker.   HISTORY AND PHYSICAL:  [name] is a 70 year old white female with a  history of HIV who was admitted directly to the medical teaching service  by Dr. [name] because of acute  renal failure and anemia with  persistent nausea and vomiting for approximately 2 months.  Patient's  primary doctor is Dr. Melina Copa in [place], said that her kidneys were  failing and obtained a nephrology consultation, who said she had a  benign tumor in the left kidney, but gave her no explanation for acute  renal failure.  She was admitted to [facility] between September 2  and September 8 with acute renal failure and saw a nephrologist who  thought the acute renal failure was secondary to lisinopril.  She began  drinking large amounts of water per day, approximately 1 gallon.  Her  creatinine, in July was 1.86, in September it was 3.55, and in October  it is 6.5.  She had no prior kidney disease that she knows of.  She is  also complaining of nausea and vomiting, approximately 3 times daily,  mostly with food, but other times she just had bilious vomitus.  She had  an EGD while at [facility], which  showed reflux gastritis.   At time of admission:  VITAL SIGNS:  Temperature of 100.0.  BP 181/86, which decreased to  145/87.  Pulse was 87, which decreased to 72.  Respiratory rate of 18.  O2 saturation is 93% on room air.  GENERAL:  She is in no acute distress, pale, talking in complete  sentences and pleasant.  HEENT:  Pupils are equal, round and reactive.  Extraocular muscles were  intact.  NECK:  Negative JVD.  LUNGS:  Clear to auscultation.  HEART:  Regular rate and rhythm.  Negative murmurs.  ABDOMEN:  Soft, nontender.  Positive bowel sounds.  Positive scars  consistent with cholecystectomy.  EXTREMITIES:  Two plus dorsal pedal pulses on the left, decreased pedal  pulses on the right.  SKIN:  Warm and dry.  No adenopathy.  NEURO:  Well-built, well-nourished, alert and oriented x3.  Cranial  nerves II-XII were intact.  Sensory increased input on the left side,  5/5 lower extremities.  Gait was normal.   LABS:  Remarkable for a sodium of 143, potassium 3.0, chloride of  111,  bicarb 19, BUN of 46, creatinine was 6.5, glucose of 102.  White count  of 5.7, hemoglobin of 7.4, platelets of 320, ________ of 8.5%, lipase of  49, ________count of 610.  UA showed glucose of 250, blood was small,  protein 100, leukocyte esterase was trace, no white blood cells or red  blood cells and no bacteria.  He had an anion gap of 14, bilirubin of  1.1, alkaline phosphatase 66, AST 34, ALT 39, protein 5.8, albumin 3.8  and calcium 8.7.   HOSPITAL COURSE:  1. Subacute renal failure on chronic renal failure:  She was admitted      and an ultrasound was obtained to rule out prerenal causes, which      demonstrated structure.  Her vena was greater than 1%, was 8.5%      which was not consistent with prerenal causes.  A renal consult was      obtained.  Serologies of kidney disease were negative.  ________      levels were normal.  A renal biopsy was obtained on October 18,      which was consistent with ATN.  At the start of her admission, her      HIV medications were stopped.  She was to follow up with Dr.      [name] as an outpatient.  2. Nausea and vomiting secondary to uremia:  Resolved quickly after      the second day.  3. Metabolic acidosis:  Thought to be secondary to subacute renal      failure, which also resolved.  4. Anemia:  Patient was started on Aranesp and was transfused because      her hemoglobin dropped to 6.9 and was then raised to 9.6.  5. Hypokalemia:  Secondary to nausea and vomiting, resolved.  6. HIV:  She was followed while in the hospital by infectious disease.      They held her antiretroviral therapy.  She was to follow up with      infectious disease as an outpatient.  7. Coagulopathy:  She had increased bleeding time secondary to her      platelet dysfunction and she received DDAVP x1.  8. Depression:  She was treated with Cymbalta.      Erma Heritage, M.D.  Electronically Signed    ______________________________  Luane School, M.D.    TE/MEDQ  D:  10/13/2006  T:  10/14/2006  Job:  [id number]

## 2011-03-13 NOTE — Op Note (Signed)
NAME:  [name], [name]                         ACCOUNT NO.:  [id number]   MEDICAL RECORD NO.:  [id number]                   PATIENT TYPE:  AMB   LOCATION:  DAY                                  FACILITY:  [facility]   PHYSICIAN:  Hildred Laser, M.D.                 DATE OF BIRTH:  1941-06-29   DATE OF PROCEDURE:  12/25/2002  DATE OF DISCHARGE:                                 OPERATIVE REPORT   PROCEDURE:  Esophagogastroduodenoscopy with esophageal dilatation.   INDICATIONS:  The patient is a 70 year old Caucasian female who presents  with solid food dysphagia.  Her last EGD was about five months ago.  No  abnormality was noted to her esophagus, but she responded to esophageal  dilatation.  She is on PPI.  She rarely experiences heartburn.  She has a  good appetite and denies epigastric pain or melena.  Her HIV is in remission  with therapy.  She states her last CD4 count was within normal range.   The procedure was reviewed with the patient and informed consent was  obtained.   PREMEDICATION:  Cetacaine spray for topical oropharyngeal anesthesia,  Demerol 50 mg IV, Versed 8 mg IV in divided dose.   INSTRUMENT USED:  Olympus video system.   FINDINGS:  Procedure performed in endoscopy suite.  The patient's vital  signs and O2 saturation were monitored during procedure and remained stable.  The patient was placed in the left lateral recumbent position and the  endoscope was passed via oropharynx without any difficulty into esophagus.   Esophagus:  Mucosa of the esophagus was normal throughout.  The  squamocolumnar junction was unremarkable.  No ring, stricture, or hernia was  noted.  There was also no laxity to suggest Candida infection.   Stomach:  It was empty and distended very well with insufflation.  The folds  of the proximal stomach were normal.  Examination of the mucosa revealed  antral erosions.  No ulcer or vascular lesions were noted.  Angularis,  fundus, and cardia were  examined by retroflexing the scope and were normal.   Duodenum:  Examination of the bulb, second, and third part of the duodenum  was normal.  Endoscope was withdrawn.   Esophagus was dilated by passing 56 and 58 [place] Maloney dilators.  Subsequent examination of the esophagus did not show any mucosal disruption.  The endoscope was withdrawn.  The patient tolerated the procedure well.   FINAL DIAGNOSES:  1. Erosive antral gastritis.  The degree of gastritis is more significant     than her last exam.  2. Normal examination of the esophagus.  3. Esophageal dilatation performed by passing 56 and 58 [place] Maloney     dilators.   RECOMMENDATIONS:  1. She will resume her usual medications.  2. If she remains with dysphagia, will bring her back for barium study with     a pill prior to  esophageal motility study.  3. H. pylori serology will be repeated today.                                               Hildred Laser, M.D.    NR/MEDQ  D:  12/25/2002  T:  12/25/2002  Job:  [id number]   cc:   [place]  [place] [address]  Fax: [phone]

## 2011-03-13 NOTE — H&P (Signed)
NAMELOVELEE, GNIADEK               ACCOUNT NO.:  [id number]   MEDICAL RECORD NO.:  [id number]          PATIENT TYPE:  INP   LOCATION:  2899                         FACILITY:  [facility]   PHYSICIAN:  Leeroy Cha, M.D.   DATE OF BIRTH:  [birth year]   DATE OF ADMISSION:  07/16/2004  DATE OF DISCHARGE:                                HISTORY & PHYSICAL   Ms. Bamber is a lady who has been seen in my office on several occasions  because of back pain with radiation down both legs, left worse than right  one.  The pain is getting worse up to the point that __________ to sit and  to relieve the pain.  She complains of weakness bilaterally and numbness.  The patient has failed with conservative treatment.   PAST MEDICAL HISTORY:  Negative.   REVIEW OF SYSTEMS:  Positive for HIV.  She has a history of UTI, anxiety,  depression, bronchitis, pneumonia, high cholesterol.   MEDICATIONS:  She is taking Viracept, Viramune, Neurontin, Protonix,  Effexor.   ALLERGIES:  She is allergic to HALDOL, NAVANE, and ZINC.   SOCIAL HISTORY:  Negative.   PHYSICAL EXAMINATION:  GENERAL:  The patient came to my office walking with  a short step.  HEENT:  Head, ears, nose, and throat normal.  NECK:  Normal.  CHEST:  Lungs clear.  CARDIAC:  Heart sounds normal.  ABDOMEN:  Normal.  EXTREMITIES:  Normal pulses.  NEUROLOGIC:  Mental status normal.  Cranial nerves normal.  Strength:  She  had weakness of positive flexion and extension on both feet, 3/5.  Reflexes  symmetrical, 1+.   The x-ray shows that she has a severe case of stenosis at the level of L3-4,  L4-5, and L5-S1.   CLINICAL IMPRESSION:  Lumbar stenosis with neurogenic claudication.   RECOMMENDATIONS:  The patient is being admitted for surgery.  The procedure  will be a three-level decompression of the lumbar spine.  The risks were for  infection, CSF leak, worsening of pain, paralysis, and need for surgery, and  all the risks associated with her  being HIV-positive.       EB/MEDQ  D:  07/16/2004  T:  07/16/2004  Job:  [id number]

## 2011-03-13 NOTE — H&P (Signed)
[name], [name]               ACCOUNT NO.:  [id number]   MEDICAL RECORD NO.:  [id number]          PATIENT TYPE:  INP   LOCATION:  0103                         FACILITY:  [facility]   PHYSICIAN:  Hind I Elsaid, MD      DATE OF BIRTH:  1941/03/01   DATE OF ADMISSION:  12/08/2006  DATE OF DISCHARGE:                              HISTORY & PHYSICAL   CHIEF COMPLAINT:  Bloody diarrhea for one day; abdominal pain, nausea  and vomiting for one day.   HISTORY OF PRESENT ILLNESS:  A 70 year old female with a history of HIV.  Last CD4 count was 320.  Recently HIV medication was stopped due to  deteriorating, renal insufficiency and elevated on liver function test.  Also, the patient with a history of nausea, vomiting and abdominal pain  which was felt due to renal insufficiency.  The patient presented today  to [facility] with history of one-day complaint of crampy  abdominal pain, mainly at the left iliac fossa, not radiating.  The pain  was 10/10.  It started today around 11 a.m., associated with three bouts  of vomiting mainly a greenish-yellow content, no blood and then the  patient felt the need to defecate.  She noticed a gush of blood coming  from her stool associated with watery diarrhea with mucoid.  The patient  admitted she vomited around three times, mainly as we mentioned before  yellow to greenish secretion.  No blood.  She has three to four episodes  of bloody diarrhea which is associated with crampy abdominal pain on the  left iliac fossa.  The patient denies any history of fever.  Denies any  recent antibiotic use and admitted she never had a history of bloody  diarrhea in the past but she had a history of nausea and vomiting  before, status post workup.  Nausea and vomiting completely resolved and  they attributed the nausea and vomiting due to HIV medication which was  stopped.  The patient denies any fever.  Denies any change in yellow  color.  Denies any chest  pain, shortness of breath.  Denies any  headache.  Denies any numbness or lower extremity weakness.  Denies any  skin rash or ecchymosis.  The patient has a history of colonoscopy  performed five years and is done as an outpatient and according to her  memory it was normal.   PAST MEDICAL HISTORY:  1. History of nausea, vomiting, abdominal pain last month felt due to      toxicity of HIV medication.  2. History of chronic renal insufficiency.  3. History of hypoalbuminemia, resolved after stopping the [place].  4. Anemia of chronic disease on Aranesp.  5. HIV with last CD4 count 320 on November 02, 2006.  6. History of coagulopathy secondary to platelet dysfunction.  7. Generalized anxiety disorder.  8. Depression.   MEDICATIONS:  1. Protonix 40 mg p.o. daily.  2. Cetirizine 10 mg p.o. daily.  3. Ativan 1 mg p.o. q.6h.  4. Tussionex.  5. Multivitamin one tablet p.o. daily.   ALLERGIES:  1. Allergy  to HALDOL.  2. PHENERGAN.  3. DARVOCET.  4. COMPAZINE.   PAST SURGICAL HISTORY:  History of cholecystectomy in the past.   SOCIAL HISTORY:  She lives with boyfriend.  She is widowed and divorced.  Her children which are grown.  No history of alcoholism, smoking or IV  drug use.   EXAMINATION:  VITAL SIGNS:  Temperature 99, blood pressure 146/51, pulse  rate 90, respiratory rate 20, pulse ox 100%.  The patient lying  comfortably on bed, not in respiratory distress or shortness of breath.  HEENT:  Not pale, not jaundiced.  Pupils equal, reactive to light and  accommodations.  Extraocular muscle movement is normal.  Mouth is moist.  No oral thrush.  NECK:  There is no lymphadenopathy, no JVD and no thyromegaly.  LUNG EXAMINATION:  Normal regular breathing, equal air entry.  CHEST:  Heart S1-S2, no other sound.  ABDOMEN EXAMINATION:  Soft, nondistended.  Bowel sounds present.  Mild  tenderness at the left iliac fossa with deep palpation.  No guarding, no  rebound tenderness, no  organomegaly.  RECTAL EXAMINATION:  Mucoid discharge from the rectum with evidence of  blood on it but amount is small.  LOWER EXTREMITIES:  No lower limb edema.  CNS:  Alert and oriented x3 with no focal neurological findings.   A test was done in the hospital.  White blood cells 9.7, hemoglobin  11.7, hematocrit 34.5 and platelet 184.  CMP:  Sodium 138, potassium  4.5, chloride 107, CO2 20, glucose 130, BUN 28, creatinine 2.88 with a  GFR of 16.  Liver function tests:  Bilirubin 1.2, alkaline phosphate  107, AST 36, ALT 27, total protein 7.1, albumin 3.9.  C. difficile was  negative.  Urinalysis was negative and the stool culture negative ovum  and parasite.  Negative urinalysis, few squamous, white blood cells 3-6  and RBCs 3-6.  No further blood test was done.  Ultrasound of the  abdomen:  Status post cholecystectomy and no biliary ductal dilatation,  status post cholecystectomy.  Mild cortical thinning involving both  kidneys and change compared to the ultrasound months ago.  Otherwise,  normal ultrasound.   ASSESSMENT/PLAN:  1. The patient admitted for abdominal pain with bloody diarrhea.  The      pain mainly at the left iliac fossa with bloody diarrhea,      possibility of bleeding diverticulitis, or diverticulosis or      arteriovenous malformation.  Rectal examination was negative for      blood at this time.  We will keep the patient n.p.o.  We will order      CT of the abdomen and pelvis.  We will get hemoglobin and      hematocrit q.8h.  We will start the patient on Cipro and Flagyl for      possibility of diverticulitis to be stopped if the CT scan was      negative.  We will get a stool for Clostridium difficile and      gastroenterologist consulted for possible colonoscopy if needed.  2. Nausea and vomiting:  The patient was admitted last month with a     history of nausea and vomiting.  She was diagnosed due to human      immunodeficiency virus, heart medication  toxicity.  The patient      today complained of nausea and vomiting.  We will get abdominal x-      ray, flat and upright and we will consider gastroenterologist  consult for possible upper gastrointestinal endoscopy.  We will do      supportive measure at this time.  3. Human immunodeficiency virus:  The patient was on human      immunodeficiency virus medication, recently stopped due to elevated      on liver function tests and deteriorating renal function.  We will      get CD4 count, viral load and possible infectious disease consult.      Order the patient to follow in the infectious disease clinic as an      outpatient.  4. Chronic renal insufficiency which is stable from previous      admission.  5. Anemia which is stable at this time.  There is no evidence of acute      blood loss.  We will hemoglobin and      hematocrit q.8h.  6. Anxiety and depression:  To continue her home medication.  7. Deep vein thrombosis and gastrointestinal prophylaxis:  Sequential      compression device.      Hind Franco Collet, MD  Electronically Signed     HIE/MEDQ  D:  12/08/2006  T:  12/09/2006  Job:  [id number]

## 2011-03-13 NOTE — Discharge Summary (Signed)
[name], [name]               ACCOUNT NO.:  [id number]   MEDICAL RECORD NO.:  [id number]          PATIENT TYPE:  INP   LOCATION:  5506                         FACILITY:  [facility]   PHYSICIAN:  Thomes Lolling, M.D.    DATE OF BIRTH:  12-09-1940   DATE OF ADMISSION:  09/23/2006  DATE OF DISCHARGE:  09/27/2006                               DISCHARGE SUMMARY   DISCHARGE DIAGNOSES:  1. Syncope.  2. Anemia.  3. Increase in liver function tests.  4. Human immunodeficiency virus.  5. Hypothyroidism.   DISCHARGE MEDICATIONS:  1. Levothyroxine 50 mcg daily.  2. Loratadine 10 mg daily for 1 more week.  3. Ativan 1 mg four times a day.   DISPOSITION:  The patient will followup with Dr. Octavio Graves next  week, and there her blood pressure will be checked.  Also, she will  followup on her dizziness to see if this persists, and will consider  resuming her medications, which we have stopped.  The patient also has  an appointment with Dr. [name] on September 29, 2006 at 3:15.  Here, she  will be evaluated for her increase in LFT's, possibly secondary to her  HIV medication.  Also, will consider restarting any medications that do  not affect the liver, which may be causing the increase in LFT's.   PROCEDURES PERFORMED:  1. Carotid Doppler that showed no evidence of stenosis and no      retrograde flow following the vertebral artery.  2. The patient also had a echo in October of 2007 which showed no      aortic stenosis with an ejection fraction of 65%, and left      ventricular wall abnormalities could not be assessed at this time.   CONSULTATIONS:  None.   ADMITTING HISTORY AND PHYSICAL:  This is a 70 year old woman with a  history of ATN, likely secondary to HIV medications, HIV anemia, who  presents with a 1-week history of feeling like she was going to pass out  after getting up or on exertion, with shortness of breath associated.  Before the presentation, she went for a glass of  water and passed out  with loss of consciousness.  The patient states she has subjective fever  and chills over the past week with shortness of breath and fatigue on  exertion.   ALLERGIES:  The patient has no allergies.   HOSPITAL COURSE:  1. Syncope.  The patient was admitted to the floor, put on the      telemetry unit, and followed up for any kind arrhythmia.  Also, an      echo was checked, which showed no aortic stenosis.  She had no      focal deficits at this time.  While on admission, the patient's HIV      medication was stopped, and also Elavil was stopped, which was      thought to be contributing to her orthostasis.  After Elavil was      stopped, her orthostatic changes, which she had on admission, had      resolved.  The  patient was hydrated on admission with no further      complications.  2. Anemia.  The patient's hemoglobin stayed stable throughout her      hospital stay.  The patient's ferritin was high, and all the other      values were low.  Due to her chronic history of anemia, the patient      will be followed up by the hematologist/oncologist as an      outpatient.  The patient's parvovirus B-19 was negative.  The      patient will need a biopsy as an outpatient with      hematology/oncology.  3. Increase in liver function tests.  Unsure of etiology.  Hepatitis      panel was sent and is pending to date.  Her LFT's remained high      during her hospital stay, so her HIV medication was stopped,      thinking that HIV could be contributing to her increase in LFT's.      The patient will followup with Dr. [name], her IV doctor, for      evaluation and resuming her heart therapy.  4. HIV.  Her therapy was stopped secondarily to ATN, and probably her      heart therapy is contributing to her increase in LFT's.  Hepatitis      panel was sent, which is pending to date.  5. Hypothyroidism.  No changes were made.   DISCHARGE LABORATORIES:  Her white blood cells  were 4.5, hemoglobin was  9.4, platelets 229, MCV 94, sodium 140, potassium 3.8, chloride 110,  bicarbonate 22, BUN 26, creatinine 3.6, glucose 106.  Bilirubin 0.9.  Alkaline phosphatase 84.  AST 72, ALT 94.  Protein 5.8.  Albumin 3.2,  calcium 8.0.   VITAL SIGNS ON THE DAY OF DISCHARGE:  Temperature 98.5, blood pressure  119/60, pulse 84, respirations 20, and she was saturating 96% on room  air.      Charlynne Cousins, M.D.  Electronically Signed      Thomes Lolling, M.D.  Electronically Signed    AF/MEDQ  D:  09/27/2006  T:  09/27/2006  Job:  [id number]   cc:   Arelia Longest. Michelle Nasuti., M.D.  Octavio Graves

## 2011-03-13 NOTE — Discharge Summary (Signed)
[name], [name]               ACCOUNT NO.:  [id number]   MEDICAL RECORD NO.:  [id number]          PATIENT TYPE:  INP   LOCATION:  [id number]                         FACILITY:  [facility]   PHYSICIAN:  [name], M.D.  DATE OF BIRTH:  Apr 09, 1941   DATE OF ADMISSION:  11/12/2006  DATE OF DISCHARGE:  11/15/2006                               DISCHARGE SUMMARY   DISCHARGE DIAGNOSES:  1. Acute onset of nausea and vomiting and abdominal pain, most      noticeable in the right upper quadrant.  2. Hyperbilirubinemia, with bilirubin of 5.4 on admission.  3. Chronic renal insufficiency.  The patient seen by Dr. [name].  4. Anemia chronic disease, on Aranesp.  5. HIV with last CD4 count of 320 in November 02, 2006, and 24 520 in      December 2007.  6. History of coagulopathy secondary to platelets dysfunction.  7. Generalized anxiety disorder.  8. ? depression.   DISCHARGE MEDICATIONS:  1. Ativan 1 mg p.o. every q.i.d. p.r.n.  2. Protonix 40 mg p.o. every day.  3. Vitamin B1, one tab p.o. every day.  4. Aranesp injections.   FOLLOWUP:  1. The patient is to follow up with Dr. [name] at the [facility] on January 29 at 2:45 p.m.      After this, the patient will resume primary care visits with      primary care physician [name].  2. [facility], phone number there is [phone], within 1-4      weeks due to chronic anorexia as well as for evaluation of this      nausea and vomiting, as per primary care physician.   Things to followup with on next clinic visit.  Please follow up on the  patient's elevated bilirubin, and if it has continued to be resolved.  Also, please consider restarting the patient's HIV medications.  She was  discharged without any of these medications at this time, and she would  have a clinic appointment in approximately 1 week, and we felt that it  would be best restarted at that time.  Also, please check on  the  patient's history of anemia of chronic disease.  Also, the patient's  appears to have a history of generalized anxiety disorder and takes  Ativan 1 mg p.o. 4 times a day.  After reviewing her medication list, I  noticed the patient was not on any SSRI or longterm antianxiety  medication.  Please consider adding one at next clinic visit if this is  appropriate.   PROCEDURES DURING THIS ADMISSION:  1. X-ray of the chest, November 12, 2006, showed no active      cardiopulmonary disease.  X-ray of the abdomen on November 12, 2006,      showed no abnormal air fluid levels.  There was mild prominence of      stool in the proximal colon, but not in the mid or distal colon.      No significant abnormal calcification densities are identified in      the abdomen.  The lungs appear clear.  Ultrasound of the abdomen:  2. Prior cholecystectomy.  No evidence of biliary dilatation.  3. Mild diffuse fatty infiltration of the liver.  4. Mildly increased renal parenchymal echogenicity consistent with      medical renal disease.  No evidence of hydronephrosis.   CONSULTATIONS:  Infectious Disease with Dr. Quentin [name], who submitted H  and P for this patient.   The patient is a 70 year old woman who presented to the hospital saying  she began feeling nauseated approximately 1 week prior to arrival.  This  was after starting new HAAART medications, per primary care physician,  Dr. Quentin [name].  On the day prior to arrival, she did call the clinic and  was told to stop her Reyataz since this medication was known to have  adverse effects, like parietis and hepatotoxicity.   The patient did stop this medication, but the night prior to admission,  she started throwing up, had several episodes of emesis that were  bilious in color.  She then went to the emergency room.   The patient denies any diarrhea, any abdominal pain, hematemesis,  hematochezia or melena or chest pain or shortness of breath.  No fever   or chills.   ALLERGIES:  The patient is allergic:  1. Haldol.  2. Compazine.  3. Phenergan.  4. Darvocet.  5. Thiothixene.   MEDICATIONS:  Home:  Lorazepam 1 mg p.o. q.i.d., Norvir 100, Reyataz  300, Protonix 40, AZT 300 b.i.d., Epivir 150 mg 1 tablet p.o. every day,  vitamin B1.   PHYSICAL EXAM:  VITAL SIGNS:  Temperature was 98.5, blood pressure  143/81, pulse 82, respiratory rate 21, saturating 100% on room air.  GENERAL:  The patient is in no acute distress.  She does appear visibly  jaundices.  HEENT:  Eyes are icteric.  Pupils are equally reactive to light and  accommodation.  Extraocular muscles are intact.  ENT:  Oropharynx is clear.  No evidence of thrush.  NECK:  Supple.  No lymphadenopathy.  RESPIRATORY:  Clear to auscultation bilaterally with good air movement,  except for some very faint bibasilar crackles.  CARDIOVASCULAR:  Regular rate and rhythm without any murmur.  GI:  Soft, nontender, nondistended with positive bowel sounds.  EXTREMITIES:  1+ edema bilaterally.  GU:  Deferred.  SKIN:  Warm and dry with evidence of jaundiced appearance.  MUSCULOSKELETAL:  Full range of motion of all 4 extremities.  NEURO:  Cranial nerves II-XII fully intact.  Moving all 4 extremities.  No areas of numbness or tingling.  PSYCH:  Alert.  Affect appropriate.   LABORATORY DATA:  Initial labs:  Sodium 139, potassium 3.9, chloride  112, bicarb 20, BUN 25, creatinine 2.5, glucose 112, white count 4.5,  hemoglobin 10.1, platelets 260.  Anion gap is 7, bilirubin 5.4, alk phos  95, AST 19, ALT 22, protein 6.6, albumin 4, calcium 9.3.   HOSPITAL COURSE:  By problem:  Nausea, vomiting, hyperbilirubinemia.  This was attributed most likely  to medication toxicities, specifically NTRI toxicity, as the patient did  just start a new medication, specifically Reyataz, which has been known  to cause hepatic toxicity.  Other appears to be included in differential first diagnosis including   common bile duct obstruction or some form of pancreatic toxicity.  We  also considered exacerbations of Gilbert syndrome during this admission.   PLAN:  Our plan was to admit this patient to the regular floor.  Hold  her HAAART medication.  Also, an  ultrasound of the abdomen   The patient's symptoms did improve remarkably once her HAAART medication  was discontinued.  Her bilirubin went from 5.4 to 3 and then to 2 on the  day of discharge.  The patient was feeling much better.  She did not  have any have anymore jaundiced appearance, and she was tolerating a  full p.o. diet without any complications.  At this point, we felt that  the patient was stable and that most likely, her cause of nausea and  vomiting was secondary to hyperbilirubinemia, which was precipitated by  the recent addition of Reyataz to her medication regimen.  1. HIV.  We did decide to hold the patient's HAART medications during      this admission.  We did not restart any of them on discharge,      although patient will have a follow up with the Infectious Disease      Clinic in approximately 8 days from his discharge and at time, she      can be restarted on her medications.  2. Chronic renal insufficiency, subacute on chronic.  The patient did      have a biopsy in the past showing acute tubernecrosis.  Her      creatinine on admission was 2.5, and this is near her baseline,      which is approximately 2, although it was difficult to tell.  Her      creatinine did come down to 2.1 on the day of admission.  3. History of coagulopathy.  We did not give the patient any aspirin,      and we decided to hold the patient's Lovenox at this time.  4. Psychiatric issues.  The patient does have a questionable history      of generalized anxiety disorder, for which she takes Ativan 1 mg      p.o. 4 times a day.  This is a decent sized dose, and we wondered      why the patient was not on an SSRI.  I will leave this to primary       care physician if she would like to start one.  It may benefit her      anxiety as well as the question of depression, which was alluded to      on previous discharge summaries.   DISPOSITION:  We felt that the patient was medically stable for  discharge today, November 15, 2006.  Her nausea, vomiting and all her  other symptoms had completely resolved.  She was back to her baseline.  She was feeling very well and was desiring to go home.  We did ask her  about obtaining the GI consultation (which was done), as well as she  could receive the GI workup as an outpatient.  She requested as an  outpatient as this would be preferable to the patient.  This was agreed  upon by Dr. Quentin [name] at the time.  The patient will be scheduled with  [facility] once she leaves the hospital, and I have provided  her with that number as well as the office staff at [facility] has agreed to contact the patient to schedule follow up appointment.  Discharged  today.  Labs and vitals:  Temperature 98.4, blood pressure 135/69, pulse  71, respiratory rate 18, saturating 97% on room air.  White count is  4.6, hemoglobin 9.2, platelets 292, sodium 141, potassium 3.5, chloride  103, bicarb 22, BUN 10, creatinine  2.1, glucose 91, total bilirubin was  2.      [name], M.D.  Electronically Signed     AS/MEDQ  D:  11/15/2006  T:  11/16/2006  Job:  [id number]   cc:   [facility]  [facility]

## 2011-03-13 NOTE — Op Note (Signed)
[name], [name]               ACCOUNT NO.:  [id number]   MEDICAL RECORD NO.:  [id number]          PATIENT TYPE:  INP   LOCATION:  3019                         FACILITY:  [facility]   PHYSICIAN:  Leeroy Cha, M.D.   DATE OF BIRTH:  Aug 20, 1941   DATE OF PROCEDURE:  07/16/2004  DATE OF DISCHARGE:                                 OPERATIVE REPORT   PREOPERATIVE DIAGNOSIS:  Neurogenic claudication with a lumbar stenosis.   POSTOPERATIVE DIAGNOSIS:  Neurogenic claudication with a lumbar stenosis.   OPERATION PERFORMED:  Bilateral L3, 4, 5 laminectomy, foraminotomy.   SURGEON:  Leeroy Cha, M.D.   ANESTHESIA:  General.   INDICATIONS FOR PROCEDURE:  The patient was admitted because of back pain  and leg pain.  The pain was going to both legs and was associated with  walking.  X-ray showed stenosis at the level 3-4, 4-5.  Surgery was advised.  The risks were explained in the history especially with her being HIV  positive.   DESCRIPTION OF PROCEDURE:  The patient was taken to the operating room.  After intubation, she was positioned in a prone manner.  The back was  prepped with Betadine.  Midline incision from L3 to L5-S1 was made.  Muscle  was retracted laterally.  Then with a Stille rongeur we removed the spinous  process of 3, 4, 5.  The patient has a thick yellow ligament which also was  excised.  We went laterally until we did total decompression.  Then we went  into the foramina to decompress the L3, L4, L5 and S1 nerve roots.  This  procedure was done bilaterally.  At the end having good decompression, the  area was irrigated.  Fentanyl and DepoMedrol were left in the dural space  and the wound was closed with Vicryl and Steri-Strips.       EB/MEDQ  D:  07/16/2004  T:  07/17/2004  Job:  [id number]

## 2011-03-16 NOTE — Progress Notes (Signed)
  Subjective:    Patient ID: [name], female    DOB: Nov 26, 1940, 70 y.o.   MRN: [id number]  HPI Presents to clinic for three-month followup. Last labs were obtained 12/01/2010. Remains with complaints of ongoing neuropathic pain to lower extremities for which she was once on therapy, but do to her renal insufficiency, these medications were discontinued. Currently on Isentress, and Ziagen, only. Still follows up with nephrology and urology. States she has been taking her Isentress 2 tablets once daily and her Ziagen one tablet twice daily. Denies any complications with her antiretrovirals at present.   Review of Systems  Constitutional: Positive for fatigue.  HENT: Negative.   Eyes: Negative.   Respiratory: Negative.   Cardiovascular: Negative.   Gastrointestinal: Negative.   Genitourinary: Negative.  Negative for dyspareunia.  Musculoskeletal: Negative.   Skin: Negative.   Neurological: Positive for numbness. Negative for dizziness.  Hematological: Negative.   Psychiatric/Behavioral: Negative.        Objective:   Physical Exam  Constitutional: She is oriented to person, place, and time. She appears well-developed and well-nourished. No distress.  HENT:  Head: Normocephalic and atraumatic.  Eyes: Conjunctivae and EOM are normal. Pupils are equal, round, and reactive to light.  Neck: Normal range of motion. Neck supple.  Cardiovascular: Normal rate, regular rhythm, normal heart sounds and intact distal pulses.   Pulmonary/Chest: Effort normal and breath sounds normal.  Abdominal: Soft. Bowel sounds are normal.  Musculoskeletal: Normal range of motion.  Neurological: She is alert and oriented to person, place, and time. No cranial nerve deficit. She exhibits normal muscle tone. Coordination normal.       Paresthesia to light pinprick noted in bilateral lower extremities  Skin: Skin is warm and dry.  Psychiatric: She has a normal mood and affect. Her behavior is normal.  Judgment and thought content normal.          Assessment & Plan:  1. HIV. From labs obtained on 12/01/2010, her CD4 count was 790 at 26% with a viral load of less than 20 copies per mL. She remains clinically stable on her regimen, although her regimen is not optimal, based on standard treatment guidelines. Her GFR remains greater than 30, but less than 60. Given this data, 3TC, might be an adequate therapy for her. Recommend beginning 3TC 150 mg by mouth every Monday-Wednesday-Friday. Given her history of renal insufficiency. Would recommend that she followup

## 2011-03-25 ENCOUNTER — Encounter: Payer: Self-pay | Admitting: Adult Health

## 2011-05-15 ENCOUNTER — Encounter (HOSPITAL_COMMUNITY): Payer: Self-pay | Admitting: *Deleted

## 2011-05-15 ENCOUNTER — Emergency Department (HOSPITAL_COMMUNITY)
Admission: EM | Admit: 2011-05-15 | Discharge: 2011-05-16 | Disposition: A | Discharge status: Home / Self Care | Payer: Medicare Other | Attending: Emergency Medicine | Admitting: Emergency Medicine

## 2011-05-15 DIAGNOSIS — M549 Dorsalgia, unspecified: Secondary | ICD-10-CM

## 2011-05-15 DIAGNOSIS — M545 Low back pain, unspecified: Secondary | ICD-10-CM | POA: Insufficient documentation

## 2011-05-15 DIAGNOSIS — Z21 Asymptomatic human immunodeficiency virus [HIV] infection status: Secondary | ICD-10-CM | POA: Insufficient documentation

## 2011-05-15 HISTORY — DX: Asymptomatic human immunodeficiency virus (hiv) infection status: Z21

## 2011-05-15 HISTORY — DX: Bronchitis, not specified as acute or chronic: J40

## 2011-05-15 HISTORY — DX: Human immunodeficiency virus (HIV) disease: B20

## 2011-05-15 HISTORY — DX: Human immunodeficiency virus (HIV) disease: N08

## 2011-05-15 NOTE — ED Notes (Signed)
Low back pain for 3 days, getting worse.  Increased pain with movement.

## 2011-05-15 NOTE — ED Notes (Signed)
Pt c/o back pain that radiates down to bilateral legs

## 2011-05-16 LAB — URINALYSIS, ROUTINE W REFLEX MICROSCOPIC
Bilirubin Urine: NEGATIVE
Hgb urine dipstick: NEGATIVE
Specific Gravity, Urine: >1.03 — ABNORMAL HIGH (ref 1.005–1.030)
pH: 6 (ref 5.0–8.0)

## 2011-05-16 MED ORDER — ONDANSETRON HCL 4 MG PO TABS
4.0000 mg | ORAL_TABLET | Freq: Once | ORAL | Status: AC
  Administered 2011-05-16: 4 mg via ORAL
  Filled 2011-05-16: qty 1

## 2011-05-16 MED ORDER — CYCLOBENZAPRINE HCL 10 MG PO TABS: 10.0000 mg | ORAL_TABLET | Freq: Two times a day (BID) | ORAL | Status: AC | PRN

## 2011-05-16 MED ORDER — HYDROMORPHONE HCL 1 MG/ML IJ SOLN
1.0000 mg | Freq: Once | INTRAMUSCULAR | Status: AC
  Administered 2011-05-16: 1 mg via INTRAMUSCULAR
  Filled 2011-05-16: qty 1

## 2011-05-16 MED ORDER — KETOROLAC TROMETHAMINE 60 MG/2ML IM SOLN
60.0000 mg | Freq: Once | INTRAMUSCULAR | Status: AC
  Administered 2011-05-16: 60 mg via INTRAMUSCULAR
  Filled 2011-05-16: qty 2

## 2011-05-16 MED ORDER — HYDROCODONE-ACETAMINOPHEN 5-325 MG PO TABS: 1.0000 | ORAL_TABLET | ORAL | Status: AC | PRN

## 2011-05-16 NOTE — ED Provider Notes (Addendum)
History     Chief Complaint  Patient presents with  . Back Pain   HPI Comments: Patient had back pain about 2 weeks ago that resolved on its own. Today she had walked to town to get groceries. She did not carry anything heavy. She was sitting at home tonight and began to have severe lower back pain. Worse with movement. Better with complete rest. Has not taken anything at home to relieve the pain. Denies numbness, tingling. Has a h/o back surgery 5-6 years ago (?discectomy).  Patient is a 70 y.o. female presenting with back pain. The history is provided by the patient.  Back Pain  This is a new problem. The current episode started 6 to 12 hours ago. The problem occurs constantly. The problem has been gradually worsening. The pain is associated with no known injury. The pain is present in the lumbar spine. The quality of the pain is described as stabbing, shooting, aching and burning. The pain does not radiate. The pain is at a severity of 8/10. The pain is moderate (walking). The symptoms are aggravated by bending. Pertinent negatives include no numbness, no bowel incontinence, no leg pain, no paresthesias, no tingling and no weakness. She has tried nothing for the symptoms.    Past Medical History  Diagnosis Date  . Bronchitis   . HIV (human immunodeficiency virus infection)   . 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 Abdominal hysterectomy 1985  . Back surgery   . Tubal ligation     History reviewed. No pertinent family history.  History  Substance Use Topics  . Smoking status: Never Smoker   . Smokeless tobacco: Never Used  . Alcohol Use: No    OB History    Grav Para Term Preterm Abortions TAB SAB Ect Mult Living                  Review of Systems  Gastrointestinal: Negative for bowel incontinence.  Musculoskeletal: Positive for back pain.  Neurological:  Negative for tingling, weakness, numbness and paresthesias.  All other systems reviewed and are negative.    Physical Exam  BP 143/68  Pulse 63  Temp(Src) 99.1 F (37.3 C) (Oral)  Resp 20  Ht 5\' 5"  (1.651 m)  Wt 185 lb (83.915 kg)  BMI 30.79 kg/m2  SpO2 95%  Physical Exam  Constitutional: She appears well-developed and well-nourished.  HENT:  Head: Normocephalic and atraumatic.  Eyes: EOM are normal. Pupils are equal, round, and reactive to light.  Neck: Normal range of motion. Neck supple.  Cardiovascular: Normal rate.   Pulmonary/Chest: Effort normal and breath sounds normal.  Abdominal: Soft.  Musculoskeletal:       Pain to lumbar paraspinal muscles bilaterally with palpation. No cervicall, thoracic or lumbar spinal pain    ED Course  Procedures  MDM Reviewed vital signs, nurse notes, laboratory data.      Gypsy Balsam. Olin Hauser, MD 05/16/11 [place] Olin Hauser, MD 05/16/11 [phone]

## 2011-05-16 NOTE — Progress Notes (Signed)
Patient feels better after analgesic.

## 2011-05-16 NOTE — ED Notes (Signed)
Pt carried out in wheelchair. Family member w/ discharge papers & scripts

## 2011-05-16 NOTE — ED Notes (Signed)
Dr Olin Hauser in to see pt

## 2011-05-22 ENCOUNTER — Emergency Department (HOSPITAL_COMMUNITY)
Admission: EM | Admit: 2011-05-22 | Discharge: 2011-05-22 | Disposition: A | Discharge status: Home / Self Care | Payer: Medicare Other | Attending: Emergency Medicine | Admitting: Emergency Medicine

## 2011-05-22 ENCOUNTER — Encounter (HOSPITAL_COMMUNITY): Payer: Self-pay | Admitting: *Deleted

## 2011-05-22 DIAGNOSIS — N058 Unspecified nephritic syndrome with other morphologic changes: Secondary | ICD-10-CM | POA: Insufficient documentation

## 2011-05-22 DIAGNOSIS — Z21 Asymptomatic human immunodeficiency virus [HIV] infection status: Secondary | ICD-10-CM | POA: Insufficient documentation

## 2011-05-22 DIAGNOSIS — N390 Urinary tract infection, site not specified: Secondary | ICD-10-CM | POA: Insufficient documentation

## 2011-05-22 LAB — URINALYSIS, ROUTINE W REFLEX MICROSCOPIC
Glucose, UA: NEGATIVE mg/dL
Hgb urine dipstick: NEGATIVE
Specific Gravity, Urine: 1.015 (ref 1.005–1.030)
Urobilinogen, UA: 0.2 mg/dL (ref 0.0–1.0)

## 2011-05-22 LAB — URINE MICROSCOPIC-ADD ON

## 2011-05-22 LAB — WET PREP, GENITAL
Clue Cells Wet Prep HPF POC: NONE SEEN
Trich, Wet Prep: NONE SEEN

## 2011-05-22 MED ORDER — CEFTRIAXONE SODIUM 1 G IJ SOLR
1.0000 g | Freq: Once | INTRAMUSCULAR | Status: AC
  Administered 2011-05-22: 1 g via INTRAMUSCULAR
  Filled 2011-05-22: qty 1

## 2011-05-22 MED ORDER — CEPHALEXIN 500 MG PO CAPS: ORAL_CAPSULE | ORAL | Status: DC

## 2011-05-22 NOTE — ED Notes (Signed)
Pt states she feels like her vagina is fall out. Pain worse with movement. States the pain started last night.
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
## 2011-05-22 NOTE — ED Notes (Signed)
Pt states lower abdominal pain and pressure to vaginal area began last night. States "my bottom feels like it's going to fall out." States she was unable to sleep last night due to pain.

## 2011-05-22 NOTE — ED Notes (Signed)
Pt's wet prep taken to lab at this time. Hart Haas

## 2011-05-22 NOTE — Progress Notes (Signed)
Discussed need for Gyn Exam to r/.o prolapse.

## 2011-05-22 NOTE — ED Provider Notes (Signed)
Medical screening examination/treatment/procedure(s) were performed by non-physician practitioner and as supervising physician I was immediately available for consultation/collaboration.    Sharyon Cable, MD 05/22/11 [phone]

## 2011-05-22 NOTE — ED Provider Notes (Signed)
History     Chief Complaint  Patient presents with  . Abdominal Pain   Patient is a 70 y.o. female presenting with abdominal pain. The history is provided by the patient.  Abdominal Pain The primary symptoms of the illness include abdominal pain and dysuria. The primary symptoms of the illness do not include fever, shortness of breath, nausea or vaginal bleeding. The current episode started 13 to 24 hours ago. The onset of the illness was gradual.  The dysuria is associated with frequency. The dysuria is not associated with hematuria.   The patient states that she believes she is currently not pregnant. The patient has not had a change in bowel habit. Additional symptoms associated with the illness include constipation, frequency and back pain. Symptoms associated with the illness do not include chills or hematuria. Associated medical issues comments: lumbar disc disease.    Past Medical History  Diagnosis Date  . Bronchitis   . HIV (human immunodeficiency virus infection)   . 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 Abdominal hysterectomy 1985  . Back surgery   . Tubal ligation     History reviewed. No pertinent family history.  History  Substance Use Topics  . Smoking status: Never Smoker   . Smokeless tobacco: Never Used  . Alcohol Use: No    OB History    Grav Para Term Preterm Abortions TAB SAB Ect Mult Living                  Review of Systems  Constitutional: Negative for fever, chills and activity change.       All ROS Neg except as noted in HPI  HENT: Negative for nosebleeds and neck pain.   Eyes: Negative for photophobia and discharge.  Respiratory: Negative for cough, shortness of breath and wheezing.   Cardiovascular: Negative for chest pain and palpitations.  Gastrointestinal: Positive for abdominal pain and constipation. Negative for nausea and  blood in stool.  Genitourinary: Positive for dysuria and frequency. Negative for hematuria and vaginal bleeding.  Musculoskeletal: Positive for back pain. Negative for arthralgias.  Skin: Negative.   Neurological: Negative for dizziness, seizures and speech difficulty.  Psychiatric/Behavioral: Negative for hallucinations and confusion.    Physical Exam  BP 146/86  Pulse 78  Temp(Src) 98.7 F (37.1 C) (Oral)  Resp 16  Ht 5\' 5"  (1.651 m)  Wt 184 lb (83.462 kg)  BMI 30.62 kg/m2  SpO2 99%  Physical Exam  Nursing note and vitals reviewed. Constitutional: She is oriented to person, place, and time. She appears well-developed and well-nourished.  Non-toxic appearance.  HENT:  Head: Normocephalic.  Right Ear: Tympanic membrane and external ear normal.  Left Ear: Tympanic membrane and external ear normal.  Eyes: EOM and lids are normal. Pupils are equal, round, and reactive to light.  Neck: Normal range of motion. Neck supple. Carotid bruit is not present.  Cardiovascular: Normal rate, regular rhythm, normal heart sounds, intact distal pulses and normal pulses.   Pulmonary/Chest: Breath sounds normal. No respiratory distress.  Abdominal: Soft. Bowel sounds are normal. There is tenderness in the right lower quadrant, suprapubic area and left lower quadrant. There is no guarding.       Rt, Left, and suprapubic area pain to palpation.  Genitourinary:       Chaperone present during pelvic exam.  Pain of  the right and left vaginal wall.No mass noted. No protrusion noted at this time, even with valsalva.  Musculoskeletal: Normal range of motion.  Lymphadenopathy:       Head (right side): No submandibular adenopathy present.       Head (left side): No submandibular adenopathy present.    She has no cervical adenopathy.  Neurological: She is alert and oriented to person, place, and time. She has normal strength. No cranial nerve deficit or sensory deficit.  Skin: Skin is warm and dry.    Psychiatric: She has a normal mood and affect. Her speech is normal.    ED Course  Procedures  MDM I have reviewed nursing notes, vital signs, and all appropriate lab and imaging results for this patient.      Lenox Ahr, [place] 05/22/11 [id number]

## 2011-05-22 NOTE — Progress Notes (Signed)
Test results given to pt. Plan explained.

## 2011-05-22 NOTE — ED Notes (Addendum)
Pa in with NT to perform Pelvic exam. No change in status.

## 2011-05-23 LAB — URINE CULTURE

## 2011-05-28 NOTE — Progress Notes (Signed)
  Medical screening examination/treatment/procedure(s) were performed by non-physician practitioner and as supervising physician I was immediately available for consultation/collaboration.     

## 2011-07-14 ENCOUNTER — Emergency Department (HOSPITAL_COMMUNITY)
Admission: EM | Admit: 2011-07-14 | Discharge: 2011-07-14 | Disposition: A | Discharge status: Home / Self Care | Payer: Medicare Other | Attending: Emergency Medicine | Admitting: Emergency Medicine

## 2011-07-14 ENCOUNTER — Emergency Department (HOSPITAL_COMMUNITY): Payer: Medicare Other

## 2011-07-14 ENCOUNTER — Encounter (HOSPITAL_COMMUNITY): Payer: Self-pay | Admitting: Emergency Medicine

## 2011-07-14 ENCOUNTER — Other Ambulatory Visit: Payer: Self-pay

## 2011-07-14 DIAGNOSIS — R05 Cough: Secondary | ICD-10-CM | POA: Insufficient documentation

## 2011-07-14 DIAGNOSIS — J40 Bronchitis, not specified as acute or chronic: Secondary | ICD-10-CM | POA: Insufficient documentation

## 2011-07-14 DIAGNOSIS — R059 Cough, unspecified: Secondary | ICD-10-CM | POA: Insufficient documentation

## 2011-07-14 DIAGNOSIS — Z9079 Acquired absence of other genital organ(s): Secondary | ICD-10-CM | POA: Insufficient documentation

## 2011-07-14 DIAGNOSIS — R062 Wheezing: Secondary | ICD-10-CM | POA: Insufficient documentation

## 2011-07-14 DIAGNOSIS — Z888 Allergy status to other drugs, medicaments and biological substances status: Secondary | ICD-10-CM | POA: Insufficient documentation

## 2011-07-14 DIAGNOSIS — Z21 Asymptomatic human immunodeficiency virus [HIV] infection status: Secondary | ICD-10-CM | POA: Insufficient documentation

## 2011-07-14 DIAGNOSIS — N289 Disorder of kidney and ureter, unspecified: Secondary | ICD-10-CM | POA: Insufficient documentation

## 2011-07-14 LAB — CBC
Hemoglobin: 14.6 g/dL (ref 12.0–15.0)
MCH: 34.4 pg — ABNORMAL HIGH (ref 26.0–34.0)
MCHC: 34.4 g/dL (ref 30.0–36.0)
Platelets: 225 10*3/uL (ref 150–400)

## 2011-07-14 LAB — DIFFERENTIAL
Basophils Absolute: 0.1 10*3/uL (ref 0.0–0.1)
Basophils Relative: 1 % (ref 0–1)
Eosinophils Absolute: 0.3 10*3/uL (ref 0.0–0.7)
Monocytes Relative: 7 % (ref 3–12)
Neutro Abs: 3.2 10*3/uL (ref 1.7–7.7)
Neutrophils Relative %: 41 % — ABNORMAL LOW (ref 43–77)

## 2011-07-14 LAB — BASIC METABOLIC PANEL
Chloride: 101 mEq/L (ref 96–112)
GFR calc Af Amer: 39 mL/min — ABNORMAL LOW (ref >60)
GFR calc non Af Amer: 32 mL/min — ABNORMAL LOW (ref >60)
Potassium: 5.1 mEq/L (ref 3.5–5.1)
Sodium: 136 mEq/L (ref 135–145)

## 2011-07-14 LAB — PRO B NATRIURETIC PEPTIDE: Pro B Natriuretic peptide (BNP): 96.1 pg/mL (ref 0–125)

## 2011-07-14 MED ORDER — ALBUTEROL SULFATE (5 MG/ML) 0.5% IN NEBU
5.0000 mg | INHALATION_SOLUTION | RESPIRATORY_TRACT | Status: DC
  Administered 2011-07-14 (×2): 5 mg via RESPIRATORY_TRACT
  Filled 2011-07-14 (×2): qty 1

## 2011-07-14 MED ORDER — PREDNISONE 20 MG PO TABS
60.0000 mg | ORAL_TABLET | Freq: Every day | ORAL | Status: DC
  Administered 2011-07-14: 60 mg via ORAL
  Filled 2011-07-14: qty 3

## 2011-07-14 MED ORDER — ALBUTEROL SULFATE HFA 108 (90 BASE) MCG/ACT IN AERS: 2.0000 | INHALATION_SPRAY | RESPIRATORY_TRACT | Status: DC | PRN

## 2011-07-14 MED ORDER — PREDNISONE 20 MG PO TABS: 60.0000 mg | ORAL_TABLET | Freq: Every day | ORAL | Status: AC

## 2011-07-14 MED ORDER — IPRATROPIUM BROMIDE 0.02 % IN SOLN
0.5000 mg | RESPIRATORY_TRACT | Status: DC
  Administered 2011-07-14: 0.5 mg via RESPIRATORY_TRACT
  Filled 2011-07-14: qty 2.5

## 2011-07-14 NOTE — ED Provider Notes (Signed)
Scribed for Tanya Frames, MD, the patient was seen in room APA11/APA11 . This chart was scribed by Glory Buff. This patient's care was started at 3:30 PM.   CSN: [id number] Arrival date & time: 07/14/2011  2:38 PM   Chief Complaint  Patient presents with  . Cough    (Include location/radiation/quality/duration/timing/severity/associated sxs/prior treatment) HPI [name] is a 70 y.o. female with a history of bronchitis presents to the Emergency Department complaining of coughing for the past two weeks with associated chest tightness.  Pt reports she visited PCP, was prescribed bactrim and reports no improvement. Pt feels hot but is unsure if she has a fever. Denies vomiting, diarrhea or other associated sx. Pt denies recent long travel.  She's not noticed anything specifically that has made it better.  Past Medical History  Diagnosis Date  . Bronchitis   . HIV (human immunodeficiency virus infection)   . Kidney disease related to HIV infection     pt states she has to have her creatinine level checked often      Past Surgical History  Procedure Date  . Abdominal hysterectomy 1985  . Back surgery   . Tubal ligation     No family history on file.  History  Substance Use Topics  . Smoking status: Never Smoker   . Smokeless tobacco: Never Used  . Alcohol Use: No  Accompanied to ED by husband and female companion.   Review of Systems 10 Systems reviewed and are negative for acute change except as noted in the HPI.   Allergies  Tenofovir disoproxil fumarate; Codeine; Haloperidol lactate; Morphine sulfate; Prochlorperazine edisylate; Promethazine hcl; Propoxyphene n-acetaminophen; and Thiothixene  Home Medications   Current Outpatient Rx  Name Route Sig Dispense Refill  . ABACAVIR SULFATE 300 MG PO TABS Oral Take 600 mg by mouth daily.      . ALBUTEROL SULFATE HFA 108 (90 BASE) MCG/ACT IN AERS Inhalation Inhale 2 puffs into the lungs every 6 (six) hours as needed.  Shortness of breath     . AMITRIPTYLINE HCL 25 MG PO TABS Oral Take 25 mg by mouth at bedtime.      Marland Kitchen DOCUSATE SODIUM 100 MG PO CAPS Oral Take 100 mg by mouth as needed. For constipation     . ESCITALOPRAM OXALATE 10 MG PO TABS Oral Take 10 mg by mouth daily.      Marland Kitchen LORAZEPAM 2 MG PO TABS Oral Take 2 mg by mouth 2 (two) times daily.      . NEBIVOLOL HCL 5 MG PO TABS Oral Take 5 mg by mouth daily.      . OMEGA-3-ACID ETHYL ESTERS 1 G PO CAPS Oral Take 4 g by mouth daily.     . OXYCODONE-ACETAMINOPHEN 5-325 MG PO TABS Oral Take 1 tablet by mouth every 6 (six) hours as needed. For pain     . PANTOPRAZOLE SODIUM 40 MG PO TBEC Oral Take 40 mg by mouth daily.      Marland Kitchen RALTEGRAVIR POTASSIUM 400 MG PO TABS Oral Take 400 mg by mouth 2 (two) times daily.      . SULFAMETHOXAZOLE-TMP DS 800-160 MG PO TABS Oral Take 1 tablet by mouth 2 (two) times daily.      Marland Kitchen VITAMIN B-1 250 MG PO TABS Oral Take 250 mg by mouth daily.      Marland Kitchen VITAMIN C 250 MG PO TABS Oral Take 250 mg by mouth daily.      . CEPHALEXIN 500 MG PO  CAPS  1 po qid with food 28 capsule 0  . LAMIVUDINE 150 MG PO TABS Oral Take 1 tablet (150 mg total) by mouth daily. 30 tablet 11  . UNABLE TO FIND  l[place]a for high cholesteral    . VITAMIN E 400 UNITS PO CAPS Oral Take 400 Units by mouth daily.        Physical Exam    BP 148/68  Pulse 93  Temp(Src) 98.1 F (36.7 C) (Oral)  Resp 22  Ht 5\' 5"  (1.651 m)  Wt 184 lb (83.462 kg)  BMI 30.62 kg/m2  SpO2 97%  Physical Exam  Nursing note and vitals reviewed. Constitutional: She appears well-developed and well-nourished.  HENT:  Head: Normocephalic and atraumatic.  Right Ear: External ear normal.  Left Ear: External ear normal.  Eyes: Conjunctivae are normal. Right eye exhibits no discharge. Left eye exhibits no discharge. No scleral icterus.  Neck: Neck supple. No JVD present. No tracheal deviation present.  Cardiovascular: Normal rate, regular rhythm and intact distal pulses.     Pulmonary/Chest: Effort normal. No stridor. She has wheezes (bilateral expiratory wheezes).  Abdominal: Soft. Bowel sounds are normal. She exhibits no distension. There is no tenderness. There is no rebound and no guarding.  Musculoskeletal: She exhibits no edema (no peripheral) and no tenderness.  Neurological: She is alert. She has normal strength. No sensory deficit. Cranial nerve deficit:  no gross defecits noted. She exhibits normal muscle tone. She displays no seizure activity. Coordination normal.  Skin: Skin is warm. No rash noted. She is diaphoretic (mild).  Psychiatric: She has a normal mood and affect.   Procedures  Date: 07/14/2011  Rate: 66  Rhythm: normal sinus rhythm  QRS Axis: normal  Intervals: normal  ST/T Wave abnormalities: normal  Conduction Disutrbances:none  Narrative Interpretation:   Old EKG Reviewed: unchanged   OTHER DATA REVIEWED: Nursing notes, vital signs, and past medical records reviewed.  DIAGNOSTIC STUDIES: Oxygen Saturation is 97% on room air, normal by my interpretation.    LABS / RADIOLOGY:  Labs Reviewed  CBC - Abnormal; Notable for the following:    MCV 100.2 (*)    MCH 34.4 (*)    All other components within normal limits  DIFFERENTIAL - Abnormal; Notable for the following:    Neutrophils Relative 41 (*)    Lymphocytes Relative 48 (*)    All other components within normal limits  BASIC METABOLIC PANEL - Abnormal; Notable for the following:    BUN 27 (*)    Creatinine, Ser 1.59 (*)    GFR calc non Af Amer 32 (*)    GFR calc Af Amer 39 (*)    All other components within normal limits  PRO B NATRIURETIC PEPTIDE   Dg Chest 2 View  07/14/2011  *RADIOLOGY REPORT*  Clinical Data: Cough,  CHEST - 2 VIEW  Comparison: 07/04/2010  Findings: Normal heart size and normal vascularity.  Negative for heart failure or pneumonia.  Lungs are clear.  IMPRESSION: No active cardiopulmonary disease.  Original Report Authenticated By: Truett Perna, M.D.     ED COURSE / [place]: 15:30 EDP at pt bedside discussed with pt and family treatment plan including chest x-ray and a breathing treatment.    MDM: Patient without signs of pneumonia on her chest x-ray. No signs of pulmonary edema. I suspect this is an infectious etiology. Patient started started on Bactrim which would cover per PCP pneumonia however there is no evidence of this on the chest  x-ray. We'll try discharging the patient home on albuterol inhaler as well as a steroid course to treat her for her bronchospasm.  SCRIBE ATTESTATION: I personally performed the services described in this documentation, which was scribed in my presence.  The recorded information has been reviewed and considered.          Tanya Frames, MD 07/14/11 [phone]

## 2011-07-14 NOTE — ED Notes (Signed)
Pt states has had productive cough x 3 weeks.  Denies n/v,  Fever and chills. Pt had rx of bactrim DS called in from Dr Melina Copa, however pt states medication has not helped nor has the Robitussin Dm that she has been taking.  Pt has a history of bronchitis and pneumonia.

## 2011-07-14 NOTE — ED Notes (Signed)
Pt reports productive cough with yellowish colored sputum.  Reports has had symptoms x 2 weeks.  Also c/o headache, chest pain with coughing.  C/O generalized  weakness.

## 2011-07-14 NOTE — ED Notes (Signed)
Pt's pcp started her on bactrim on 9/12.

## 2011-07-20 LAB — CBC
Hemoglobin: 13.3
MCHC: 34.9
Platelets: 242
RDW: 12.2

## 2011-07-20 LAB — RENAL FUNCTION PANEL
Albumin: 4.2
Glucose, Bld: 89
Phosphorus: 3.9
Potassium: 3.5
Sodium: 141

## 2011-07-20 LAB — PTH, INTACT AND CALCIUM: PTH: 42.6

## 2011-07-20 LAB — IRON AND TIBC: TIBC: 275

## 2011-07-20 LAB — T-HELPER CELL (CD4) - (RCID CLINIC ONLY)
CD4 % Helper T Cell: 20 — ABNORMAL LOW
CD4 T Cell Abs: 530

## 2011-07-21 LAB — DIFFERENTIAL
Basophils Relative: 1
Eosinophils Absolute: 0.2
Eosinophils Relative: 3
Monocytes Relative: 8
Neutrophils Relative %: 57

## 2011-07-21 LAB — OCCULT BLOOD X 1 CARD TO LAB, STOOL: Fecal Occult Bld: NEGATIVE

## 2011-07-21 LAB — COMPREHENSIVE METABOLIC PANEL
ALT: 18
Alkaline Phosphatase: 67
CO2: 25
GFR calc non Af Amer: 35 — ABNORMAL LOW
Glucose, Bld: 96
Potassium: 4.2
Sodium: 144

## 2011-07-21 LAB — RENAL FUNCTION PANEL
CO2: 25
Calcium: 10.3
Chloride: 105
GFR calc non Af Amer: 32 — ABNORMAL LOW
Glucose, Bld: 109 — ABNORMAL HIGH
Potassium: 3.8
Sodium: 141

## 2011-07-21 LAB — URINALYSIS, ROUTINE W REFLEX MICROSCOPIC
Bilirubin Urine: NEGATIVE
Ketones, ur: NEGATIVE
Nitrite: NEGATIVE
Specific Gravity, Urine: 1.01
Urobilinogen, UA: 0.2

## 2011-07-21 LAB — CBC
Hemoglobin: 12.9
MCHC: 34.1
Platelets: 247
RBC: 3.96
RBC: 4.45
WBC: 7.4
WBC: 8.9

## 2011-07-21 LAB — FERRITIN: Ferritin: 139 (ref 10–291)

## 2011-07-21 LAB — IRON AND TIBC
Iron: 64
TIBC: 308

## 2011-07-21 LAB — URINE CULTURE

## 2011-07-23 LAB — T-HELPER CELL (CD4) - (RCID CLINIC ONLY)
CD4 % Helper T Cell: 23 — ABNORMAL LOW
CD4 T Cell Abs: 470

## 2011-07-28 LAB — T-HELPER CELL (CD4) - (RCID CLINIC ONLY)
CD4 % Helper T Cell: 26 — ABNORMAL LOW
CD4 T Cell Abs: 860

## 2011-08-04 ENCOUNTER — Other Ambulatory Visit (INDEPENDENT_AMBULATORY_CARE_PROVIDER_SITE_OTHER): Payer: Medicare Other

## 2011-08-04 ENCOUNTER — Other Ambulatory Visit: Payer: Self-pay

## 2011-08-04 DIAGNOSIS — B20 Human immunodeficiency virus [HIV] disease: Secondary | ICD-10-CM

## 2011-08-04 LAB — CBC WITH DIFFERENTIAL/PLATELET
Eosinophils Absolute: 0.2 10*3/uL (ref 0.0–0.7)
Eosinophils Relative: 3 % (ref 0–5)
Lymphs Abs: 2.5 10*3/uL (ref 0.7–4.0)
MCH: 34.8 pg — ABNORMAL HIGH (ref 26.0–34.0)
MCV: 102.8 fL — ABNORMAL HIGH (ref 78.0–100.0)
Monocytes Absolute: 0.6 10*3/uL (ref 0.1–1.0)
Monocytes Relative: 11 % (ref 3–12)
WBC: 5.4 10*3/uL (ref 4.0–10.5)

## 2011-08-04 LAB — T-HELPER CELL (CD4) - (RCID CLINIC ONLY): CD4 T Cell Abs: 590

## 2011-08-04 LAB — COMPLETE METABOLIC PANEL WITH GFR
CO2: 26 mEq/L (ref 19–32)
Creat: 1.59 mg/dL — ABNORMAL HIGH (ref 0.50–1.10)
GFR, Est African American: 39 mL/min — ABNORMAL LOW (ref >60)
GFR, Est Non African American: 32 mL/min — ABNORMAL LOW (ref >60)
Glucose, Bld: 77 mg/dL (ref 70–99)
Sodium: 145 mEq/L (ref 135–145)
Total Bilirubin: 0.7 mg/dL (ref 0.3–1.2)
Total Protein: 7 g/dL (ref 6.0–8.3)

## 2011-08-06 LAB — CBC
HCT: 38.9
MCV: 96.5
Platelets: 231
WBC: 7.4

## 2011-08-06 LAB — RENAL FUNCTION PANEL
CO2: 28
Calcium: 9.4
Chloride: 101
Creatinine, Ser: 3.04 — ABNORMAL HIGH
GFR calc Af Amer: 19 — ABNORMAL LOW
GFR calc non Af Amer: 15 — ABNORMAL LOW
Glucose, Bld: 77

## 2011-08-06 LAB — FERRITIN: Ferritin: 259 (ref 10–291)

## 2011-08-06 LAB — IRON AND TIBC: UIBC: 188

## 2011-08-07 LAB — IRON AND TIBC
Saturation Ratios: 34
UIBC: 183

## 2011-08-07 LAB — CBC
HCT: 35 — ABNORMAL LOW
Hemoglobin: 12.2
MCV: 93.5
Platelets: 215
RDW: 14.1 — ABNORMAL HIGH
WBC: 6.4

## 2011-08-07 LAB — RENAL FUNCTION PANEL
Albumin: 4.2
BUN: 29 — ABNORMAL HIGH
Creatinine, Ser: 1.7 — ABNORMAL HIGH
GFR calc Af Amer: 36 — ABNORMAL LOW
GFR calc non Af Amer: 30 — ABNORMAL LOW
Phosphorus: 4.1

## 2011-08-07 LAB — PTH, INTACT AND CALCIUM
Calcium, Total (PTH): 9.4
PTH: 67.8

## 2011-08-10 LAB — RENAL FUNCTION PANEL
Albumin: 4.1
CO2: 25
Chloride: 103
Creatinine, Ser: 1.8 — ABNORMAL HIGH
GFR calc Af Amer: 34 — ABNORMAL LOW
GFR calc non Af Amer: 28 — ABNORMAL LOW
Potassium: 3.9
Sodium: 134 — ABNORMAL LOW

## 2011-08-10 LAB — CBC
HCT: 37.7
MCHC: 34.3
MCV: 91.1
Platelets: 254
RDW: 14.7 — ABNORMAL HIGH

## 2011-08-10 LAB — PTH, INTACT AND CALCIUM: Calcium, Total (PTH): 9.2

## 2011-08-10 LAB — IRON AND TIBC: Iron: 117

## 2011-08-12 LAB — IRON AND TIBC
Saturation Ratios: 28
TIBC: 303
UIBC: 219

## 2011-08-12 LAB — RENAL FUNCTION PANEL
BUN: 24 — ABNORMAL HIGH
Creatinine, Ser: 1.58 — ABNORMAL HIGH
Glucose, Bld: 93
Phosphorus: 3.9
Potassium: 3.7

## 2011-08-12 LAB — CBC
Hemoglobin: 12.1
RBC: 3.91
RDW: 15.4 — ABNORMAL HIGH
WBC: 7.9

## 2011-08-12 LAB — PTH, INTACT AND CALCIUM: PTH: 53.7

## 2011-08-18 ENCOUNTER — Encounter: Payer: Self-pay | Admitting: Infectious Disease

## 2011-08-18 ENCOUNTER — Ambulatory Visit (INDEPENDENT_AMBULATORY_CARE_PROVIDER_SITE_OTHER): Payer: Medicare Other | Admitting: Infectious Disease

## 2011-08-18 VITALS — BP 130/84 | HR 57 | Temp 98.0°F | Wt 197.0 lb

## 2011-08-18 DIAGNOSIS — N259 Disorder resulting from impaired renal tubular function, unspecified: Secondary | ICD-10-CM

## 2011-08-18 DIAGNOSIS — N390 Urinary tract infection, site not specified: Secondary | ICD-10-CM

## 2011-08-18 DIAGNOSIS — Z23 Encounter for immunization: Secondary | ICD-10-CM

## 2011-08-18 DIAGNOSIS — B20 Human immunodeficiency virus [HIV] disease: Secondary | ICD-10-CM

## 2011-08-18 DIAGNOSIS — Z21 Asymptomatic human immunodeficiency virus [HIV] infection status: Secondary | ICD-10-CM

## 2011-08-18 DIAGNOSIS — R252 Cramp and spasm: Secondary | ICD-10-CM

## 2011-08-18 DIAGNOSIS — G609 Hereditary and idiopathic neuropathy, unspecified: Secondary | ICD-10-CM

## 2011-08-18 LAB — COMPREHENSIVE METABOLIC PANEL
ALT: 43 U/L — ABNORMAL HIGH (ref 0–35)
AST: 25 U/L (ref 0–37)
Albumin: 4.6 g/dL (ref 3.5–5.2)
Calcium: 10.3 mg/dL (ref 8.4–10.5)
Chloride: 105 mEq/L (ref 96–112)
Potassium: 4.4 mEq/L (ref 3.5–5.3)

## 2011-08-18 MED ORDER — ABACAVIR SULFATE 300 MG PO TABS: 600.0000 mg | ORAL_TABLET | Freq: Every day | ORAL | Status: DC

## 2011-08-18 MED ORDER — SULFAMETHOXAZOLE-TMP DS 800-160 MG PO TABS: 1.0000 | ORAL_TABLET | Freq: Every day | ORAL | Status: DC

## 2011-08-18 MED ORDER — LAMIVUDINE 150 MG PO TABS: 150.0000 mg | ORAL_TABLET | Freq: Every day | ORAL | Status: DC

## 2011-08-18 MED ORDER — RALTEGRAVIR POTASSIUM 400 MG PO TABS: 400.0000 mg | ORAL_TABLET | Freq: Two times a day (BID) | ORAL | Status: DC

## 2011-08-18 NOTE — Assessment & Plan Note (Signed)
Corrected her dosing to 600mg  abacavir, 300mg  bid of isentress and once daily lamivudine at 150mg 

## 2011-08-18 NOTE — Assessment & Plan Note (Signed)
Check esr, cmp, cbc, cpk

## 2011-08-18 NOTE — Assessment & Plan Note (Signed)
stable °

## 2011-08-18 NOTE — Progress Notes (Signed)
  Subjective:    Patient ID: [name], female    DOB: Apr 17, 1941, 70 y.o.   MRN: [id number]  HPI  70 year old Caucasian lady with HIV that is well controlled on a regimen of a abacavir, isentress and lamivudine despite the fact that she is not taking the abacavir or the isentress correctly, only taking one pill of each daily. She also has concerns related to leg cramps of her calves x several months at night and during the day. I counselled her on the correct dosing of her meds, updated the [facility] and resent rx to her pharmacy.  Review of Systems  Constitutional: Negative for fever, chills, diaphoresis, activity change, appetite change, fatigue and unexpected weight change.  HENT: Negative for congestion, sore throat, rhinorrhea, sneezing, trouble swallowing and sinus pressure.   Eyes: Negative for photophobia and visual disturbance.  Respiratory: Negative for cough, chest tightness, shortness of breath, wheezing and stridor.   Cardiovascular: Negative for chest pain, palpitations and leg swelling.  Gastrointestinal: Negative for nausea, vomiting, abdominal pain, diarrhea, constipation, blood in stool, abdominal distention and anal bleeding.  Genitourinary: Negative for dysuria, hematuria, flank pain and difficulty urinating.  Musculoskeletal: Positive for myalgias. Negative for back pain, joint swelling, arthralgias and gait problem.  Skin: Negative for color change, pallor, rash and wound.  Neurological: Negative for dizziness, tremors, weakness and light-headedness.  Hematological: Negative for adenopathy. Does not bruise/bleed easily.  Psychiatric/Behavioral: Negative for behavioral problems, confusion, sleep disturbance, dysphoric mood, decreased concentration and agitation.       Objective:   Physical Exam  Constitutional: She is oriented to person, place, and time. She appears well-developed and well-nourished. No distress.  HENT:  Head: Normocephalic and atraumatic.    Mouth/Throat: Oropharynx is clear and moist. No oropharyngeal exudate.  Eyes: Conjunctivae and EOM are normal. Pupils are equal, round, and reactive to light. No scleral icterus.  Neck: Normal range of motion. Neck supple. No JVD present.  Cardiovascular: Normal rate, regular rhythm and normal heart sounds.  Exam reveals no gallop and no friction rub.   No murmur heard. Pulmonary/Chest: Effort normal and breath sounds normal. No respiratory distress. She has no wheezes. She has no rales. She exhibits no tenderness.  Abdominal: She exhibits no distension and no mass. There is no tenderness. There is no rebound and no guarding.  Musculoskeletal: She exhibits no edema and no tenderness.  Lymphadenopathy:    She has no cervical adenopathy.  Neurological: She is alert and oriented to person, place, and time. She has normal reflexes. She exhibits normal muscle tone. Coordination normal.  Skin: Skin is warm and dry. She is not diaphoretic. No erythema. No pallor.  Psychiatric: She has a normal mood and affect. Her behavior is normal. Judgment and thought content normal.          Assessment & Plan:  HIV DISEASE Corrected her dosing to 600mg  abacavir, 300mg  bid of isentress and once daily lamivudine at 150mg   RENAL INSUFFICIENCY stable  Cramps, muscle, general Check esr, cmp, cbc, cpk  PERIPHERAL NEUROPATHY ? If she has new cramps or this is manifestation of her prior neuropathy

## 2011-08-18 NOTE — Assessment & Plan Note (Signed)
?   If she has new cramps or this is manifestation of her prior neuropathy

## 2011-08-18 NOTE — Patient Instructions (Signed)
Your antiviral medications are as follows:\  TAKE TWO EPZICOM 300MG  TABLETS (BEIGE PILLS) ONCE A DAY TAKE ONE LAMIVUDINE 150MG  (WHITE DIAMOND SHAPED PILL) ONCE A DAY  TAKE ONE ISENTRESS 400MG  (ORANGE PILL) IN THE MORNING AND IN THE EVENING  BRING YOUR MEDICINE BOTTLES TO THE NEXT VISIT

## 2011-08-19 ENCOUNTER — Telehealth: Payer: Self-pay | Admitting: *Deleted

## 2011-08-19 LAB — ANA: Anti Nuclear Antibody(ANA): NEGATIVE

## 2011-08-19 LAB — SEDIMENTATION RATE: Sed Rate: 4 mm/hr (ref 0–22)

## 2011-08-19 NOTE — Telephone Encounter (Signed)
Pt wanted to know why she was supposed to take new rx Bactrim DS.  Pt stated that she is already taking Macrodantin for a UTI.  RN will speak with Dr [name] about the rx.  Dr. [name] stated that he did not know that the pt was taking Macrodantin already and he was updating the pt's medication list at the [facility] yesterday.  Dr. [name] stated that the pt should not take the Bactrim DS rx.  RN called the pt back and shared Dr. Derek Mound message.  The pt verbalized understanding.  Bactrim DS was removed from the pt's medication list and Macrodantin was added.  Phone call to pt's pharmacy to discontinue Bactrim DS.  Pharmacy stated that they would cancel all future refills.

## 2011-09-30 ENCOUNTER — Ambulatory Visit: Payer: Medicare Other | Admitting: Infectious Disease

## 2011-09-30 ENCOUNTER — Telehealth: Payer: Self-pay | Admitting: Licensed Clinical Social Worker

## 2011-09-30 NOTE — Telephone Encounter (Signed)
This patient has rescheduled her missed appointment for today, but she needed to know if Dr. [name] found out the reason that she was having leg cramps. Please advise

## 2011-10-05 NOTE — Telephone Encounter (Signed)
Unfortunately i did not

## 2011-10-29 ENCOUNTER — Other Ambulatory Visit: Payer: Medicare Other

## 2011-11-12 ENCOUNTER — Ambulatory Visit: Payer: Medicare Other | Admitting: Infectious Disease

## 2011-11-14 ENCOUNTER — Other Ambulatory Visit: Payer: Self-pay | Admitting: Internal Medicine

## 2011-11-25 ENCOUNTER — Encounter: Payer: Self-pay | Admitting: Infectious Disease

## 2011-11-25 ENCOUNTER — Ambulatory Visit (INDEPENDENT_AMBULATORY_CARE_PROVIDER_SITE_OTHER): Payer: Medicaid Other | Admitting: Infectious Disease

## 2011-11-25 VITALS — BP 143/82 | HR 57 | Temp 97.3°F | Ht 65.0 in | Wt 198.2 lb

## 2011-11-25 DIAGNOSIS — B20 Human immunodeficiency virus [HIV] disease: Secondary | ICD-10-CM

## 2011-11-25 DIAGNOSIS — Z79899 Other long term (current) drug therapy: Secondary | ICD-10-CM

## 2011-11-25 DIAGNOSIS — R5381 Other malaise: Secondary | ICD-10-CM

## 2011-11-25 DIAGNOSIS — I10 Essential (primary) hypertension: Secondary | ICD-10-CM | POA: Insufficient documentation

## 2011-11-25 DIAGNOSIS — R5383 Other fatigue: Secondary | ICD-10-CM

## 2011-11-25 LAB — CBC WITH DIFFERENTIAL/PLATELET
Basophils Absolute: 0 10*3/uL (ref 0.0–0.1)
Basophils Relative: 0 % (ref 0–1)
Eosinophils Absolute: 0.1 10*3/uL (ref 0.0–0.7)
HCT: 40.6 % (ref 36.0–46.0)
Hemoglobin: 13.8 g/dL (ref 12.0–15.0)
MCH: 33.9 pg (ref 26.0–34.0)
MCHC: 34 g/dL (ref 30.0–36.0)
Monocytes Absolute: 0.6 10*3/uL (ref 0.1–1.0)
Monocytes Relative: 9 % (ref 3–12)
Neutrophils Relative %: 44 % (ref 43–77)
RDW: 12.5 % (ref 11.5–15.5)

## 2011-11-25 NOTE — Assessment & Plan Note (Signed)
Not yet optimally controlled. Asked her to keep bp log at home

## 2011-11-25 NOTE — Assessment & Plan Note (Signed)
Recheck hiv vl and cd4 continue epzicom, isentress and epivir

## 2011-11-25 NOTE — Assessment & Plan Note (Signed)
Check tsh 

## 2011-11-25 NOTE — Progress Notes (Signed)
  Subjective:    Patient ID: [name], female    DOB: 1941-02-12, 71 y.o.   MRN: [id number]  HPI  [name] is a 71 y.o. female who is doing superbly well on their antiviral regimen, with undetectable viral load and health cd4 count when last checked despite the fact taht she did not appear to understand exactly how to take her meds. SHe recetnly ahd arthroscopic surgery on her right knee and is recovering from this surgery. She also endorses problems with fatigue over the past several months   Review of Systems  Constitutional: Negative for fever, chills, diaphoresis, activity change, appetite change, fatigue and unexpected weight change.  HENT: Negative for congestion, sore throat, rhinorrhea, sneezing, trouble swallowing and sinus pressure.   Eyes: Negative for photophobia and visual disturbance.  Respiratory: Negative for cough, chest tightness, shortness of breath, wheezing and stridor.   Cardiovascular: Negative for chest pain, palpitations and leg swelling.  Gastrointestinal: Negative for nausea, vomiting, abdominal pain, diarrhea, constipation, blood in stool, abdominal distention and anal bleeding.  Genitourinary: Negative for dysuria, hematuria, flank pain and difficulty urinating.  Musculoskeletal: Negative for myalgias, back pain, joint swelling, arthralgias and gait problem.  Skin: Negative for color change, pallor, rash and wound.  Neurological: Negative for dizziness, tremors, weakness and light-headedness.  Hematological: Negative for adenopathy. Does not bruise/bleed easily.  Psychiatric/Behavioral: Negative for behavioral problems, confusion, sleep disturbance, dysphoric mood, decreased concentration and agitation.       Objective:   Physical Exam  Constitutional: She is oriented to person, place, and time. She appears well-developed and well-nourished. No distress.  HENT:  Head: Normocephalic and atraumatic.  Mouth/Throat: Oropharynx is clear and moist. No  oropharyngeal exudate.  Eyes: Conjunctivae and EOM are normal. Pupils are equal, round, and reactive to light. No scleral icterus.  Neck: Normal range of motion. Neck supple. No JVD present.  Cardiovascular: Normal rate, regular rhythm and normal heart sounds.  Exam reveals no gallop and no friction rub.   No murmur heard. Pulmonary/Chest: Effort normal and breath sounds normal. No respiratory distress. She has no wheezes. She has no rales. She exhibits no tenderness.  Abdominal: She exhibits no distension and no mass. There is no tenderness. There is no rebound and no guarding.  Musculoskeletal: She exhibits no edema and no tenderness.  Lymphadenopathy:    She has no cervical adenopathy.  Neurological: She is alert and oriented to person, place, and time. She has normal reflexes. She exhibits normal muscle tone. Coordination normal.  Skin: Skin is warm and dry. She is not diaphoretic. No erythema. No pallor.  Psychiatric: She has a normal mood and affect. Her behavior is normal. Judgment and thought content normal.          Assessment & Plan:  HIV DISEASE Recheck hiv vl and cd4 continue epzicom, isentress and epivir  FATIGUE Check tsh  HTN (hypertension) Not yet optimally controlled. Asked her to keep bp log at home

## 2011-11-26 LAB — LIPID PANEL
Cholesterol: 278 mg/dL — ABNORMAL HIGH (ref 0–200)
HDL: 32 mg/dL — ABNORMAL LOW (ref >39)
Total CHOL/HDL Ratio: 8.7 Ratio
Triglycerides: 534 mg/dL — ABNORMAL HIGH (ref <150)

## 2011-11-26 LAB — COMPLETE METABOLIC PANEL WITH GFR
AST: 15 U/L (ref 0–37)
Alkaline Phosphatase: 73 U/L (ref 39–117)
BUN: 34 mg/dL — ABNORMAL HIGH (ref 6–23)
GFR, Est Non African American: 36 mL/min — ABNORMAL LOW
Glucose, Bld: 102 mg/dL — ABNORMAL HIGH (ref 70–99)
Total Bilirubin: 0.5 mg/dL (ref 0.3–1.2)

## 2011-11-27 ENCOUNTER — Telehealth: Payer: Self-pay | Admitting: *Deleted

## 2011-11-27 NOTE — Telephone Encounter (Signed)
Pt requested that someone call her to let her know the results of her labs on 11/25/11; mainly TSH. Attempted to call patient [phone]) and mailbox was full; could not leave her a message. Will attempt to call her on Monday. Eliezer Champagne RN

## 2011-11-30 LAB — HIV-1 RNA QUANT-NO REFLEX-BLD
HIV 1 RNA Quant: <20 copies/mL (ref <20)
HIV-1 RNA Quant, Log: <1.3 {Log} (ref <1.30)

## 2011-12-18 ENCOUNTER — Telehealth: Payer: Self-pay | Admitting: *Deleted

## 2011-12-18 NOTE — Telephone Encounter (Signed)
Patient received call from her PCP, Dr. Melina Copa about recent lab results. Stated her cholesterol, triglycerides, and kidney function were abnormal.  She will request those labs be faxed here. Advised her to follow up with PCP and schedule appointment to go over her results. Myrtis Hopping CMA

## 2012-01-30 ENCOUNTER — Other Ambulatory Visit: Payer: Self-pay | Admitting: Obstetrics & Gynecology

## 2012-02-10 ENCOUNTER — Other Ambulatory Visit: Payer: Medicare Other

## 2012-02-10 DIAGNOSIS — B20 Human immunodeficiency virus [HIV] disease: Secondary | ICD-10-CM

## 2012-02-10 LAB — COMPLETE METABOLIC PANEL WITH GFR
AST: 19 U/L (ref 0–37)
Alkaline Phosphatase: 73 U/L (ref 39–117)
BUN: 22 mg/dL (ref 6–23)
Calcium: 9.6 mg/dL (ref 8.4–10.5)
Creat: 1.3 mg/dL — ABNORMAL HIGH (ref 0.50–1.10)

## 2012-02-10 LAB — CBC WITH DIFFERENTIAL/PLATELET
Eosinophils Relative: 2 % (ref 0–5)
HCT: 41.3 % (ref 36.0–46.0)
Lymphocytes Relative: 40 % (ref 12–46)
Lymphs Abs: 3 10*3/uL (ref 0.7–4.0)
MCV: 98.1 fL (ref 78.0–100.0)
Neutro Abs: 3.8 10*3/uL (ref 1.7–7.7)
Platelets: 190 10*3/uL (ref 150–400)
RBC: 4.21 MIL/uL (ref 3.87–5.11)
WBC: 7.6 10*3/uL (ref 4.0–10.5)

## 2012-02-11 LAB — T-HELPER CELL (CD4) - (RCID CLINIC ONLY): CD4 % Helper T Cell: 25 % — ABNORMAL LOW (ref 33–55)

## 2012-02-12 LAB — HIV-1 RNA QUANT-NO REFLEX-BLD: HIV 1 RNA Quant: <20 copies/mL (ref <20)

## 2012-02-24 ENCOUNTER — Ambulatory Visit: Payer: Medicare Other | Admitting: Infectious Disease

## 2012-02-25 ENCOUNTER — Ambulatory Visit (INDEPENDENT_AMBULATORY_CARE_PROVIDER_SITE_OTHER): Payer: Medicare Other | Admitting: Infectious Disease

## 2012-02-25 ENCOUNTER — Encounter: Payer: Self-pay | Admitting: Infectious Disease

## 2012-02-25 VITALS — BP 157/90 | HR 54 | Temp 97.5°F | Wt 194.0 lb

## 2012-02-25 DIAGNOSIS — E785 Hyperlipidemia, unspecified: Secondary | ICD-10-CM

## 2012-02-25 DIAGNOSIS — Z113 Encounter for screening for infections with a predominantly sexual mode of transmission: Secondary | ICD-10-CM

## 2012-02-25 DIAGNOSIS — M791 Myalgia, unspecified site: Secondary | ICD-10-CM | POA: Insufficient documentation

## 2012-02-25 DIAGNOSIS — B20 Human immunodeficiency virus [HIV] disease: Secondary | ICD-10-CM

## 2012-02-25 DIAGNOSIS — R3589 Other polyuria: Secondary | ICD-10-CM

## 2012-02-25 DIAGNOSIS — IMO0001 Reserved for inherently not codable concepts without codable children: Secondary | ICD-10-CM

## 2012-02-25 DIAGNOSIS — E119 Type 2 diabetes mellitus without complications: Secondary | ICD-10-CM

## 2012-02-25 DIAGNOSIS — R358 Other polyuria: Secondary | ICD-10-CM

## 2012-02-25 LAB — LACTATE DEHYDROGENASE: LDH: 202 U/L (ref 94–250)

## 2012-02-25 LAB — CK: Total CK: 99 U/L (ref 7–177)

## 2012-02-25 NOTE — Assessment & Plan Note (Signed)
Check cpk, OK to stop zetia but needs to let PCP know

## 2012-02-25 NOTE — Assessment & Plan Note (Signed)
wil check LDL direct.

## 2012-02-25 NOTE — Progress Notes (Signed)
  Subjective:    Patient ID: [name], female    DOB: [birth year], 71 y.o.   MRN: [id number]  HPI  71 y.o. female who is doing superbly well on their antiviral regimen with isentress bid, abacavir 600mg  daily 150mg  daily of lamivudine, with undetectable viral load and health cd4 count . She is complaining today of diffuse body aches that she attributes to her newly begun zetia. On exam she has tenderness in the right knee where she had surgery. She is otherwise doing well.    Review of Systems  Constitutional: Negative for fever, chills, diaphoresis, activity change, appetite change, fatigue and unexpected weight change.  HENT: Negative for congestion, sore throat, rhinorrhea, sneezing, trouble swallowing and sinus pressure.   Eyes: Negative for photophobia and visual disturbance.  Respiratory: Negative for cough, chest tightness, shortness of breath, wheezing and stridor.   Cardiovascular: Negative for chest pain, palpitations and leg swelling.  Gastrointestinal: Negative for nausea, vomiting, abdominal pain, diarrhea, constipation, blood in stool, abdominal distention and anal bleeding.  Genitourinary: Negative for dysuria, hematuria, flank pain and difficulty urinating.  Musculoskeletal: Negative for myalgias, back pain, joint swelling, arthralgias and gait problem.  Skin: Negative for color change, pallor, rash and wound.  Neurological: Negative for dizziness, tremors, weakness and light-headedness.  Hematological: Negative for adenopathy. Does not bruise/bleed easily.  Psychiatric/Behavioral: Negative for behavioral problems, confusion, sleep disturbance, dysphoric mood, decreased concentration and agitation.       Objective:   Physical Exam  Constitutional: She is oriented to person, place, and time. She appears well-developed and well-nourished. No distress.  HENT:  Head: Normocephalic and atraumatic.  Mouth/Throat: Oropharynx is clear and moist. No oropharyngeal exudate.    Eyes: Conjunctivae and EOM are normal. Pupils are equal, round, and reactive to light. No scleral icterus.  Neck: Normal range of motion. Neck supple. No JVD present.  Cardiovascular: Normal rate, regular rhythm and normal heart sounds.  Exam reveals no gallop and no friction rub.   No murmur heard. Pulmonary/Chest: Effort normal and breath sounds normal. No respiratory distress. She has no wheezes. She has no rales. She exhibits no tenderness.  Abdominal: She exhibits no distension and no mass. There is no tenderness. There is no rebound and no guarding.  Musculoskeletal: She exhibits no edema and no tenderness.  Lymphadenopathy:    She has no cervical adenopathy.  Neurological: She is alert and oriented to person, place, and time. She has normal reflexes. She exhibits normal muscle tone. Coordination normal.  Skin: Skin is warm and dry. She is not diaphoretic. No erythema. No pallor.  Psychiatric: She has a normal mood and affect. Her behavior is normal. Judgment and thought content normal.          Assessment & Plan:  HIV DISEASE Continue abacavir, isentress and lamivudine, perfect control  Myalgia Check cpk, OK to stop zetia but needs to let PCP know  HYPERLIPIDEMIA wil check LDL direct.

## 2012-02-25 NOTE — Assessment & Plan Note (Signed)
Continue abacavir, isentress and lamivudine, perfect control

## 2012-03-04 ENCOUNTER — Encounter (HOSPITAL_COMMUNITY): Payer: Self-pay | Admitting: Emergency Medicine

## 2012-03-04 ENCOUNTER — Emergency Department (HOSPITAL_COMMUNITY)
Admission: EM | Admit: 2012-03-04 | Discharge: 2012-03-04 | Disposition: A | Discharge status: Home / Self Care | Payer: Medicare Other | Attending: Emergency Medicine | Admitting: Emergency Medicine

## 2012-03-04 ENCOUNTER — Emergency Department (HOSPITAL_COMMUNITY): Payer: Medicare Other

## 2012-03-04 DIAGNOSIS — M79609 Pain in unspecified limb: Secondary | ICD-10-CM | POA: Insufficient documentation

## 2012-03-04 DIAGNOSIS — Z79899 Other long term (current) drug therapy: Secondary | ICD-10-CM | POA: Insufficient documentation

## 2012-03-04 DIAGNOSIS — Z21 Asymptomatic human immunodeficiency virus [HIV] infection status: Secondary | ICD-10-CM | POA: Insufficient documentation

## 2012-03-04 DIAGNOSIS — R269 Unspecified abnormalities of gait and mobility: Secondary | ICD-10-CM | POA: Insufficient documentation

## 2012-03-04 DIAGNOSIS — M25559 Pain in unspecified hip: Secondary | ICD-10-CM | POA: Insufficient documentation

## 2012-03-04 LAB — CBC
MCH: 33.7 pg (ref 26.0–34.0)
MCHC: 34.5 g/dL (ref 30.0–36.0)
MCV: 97.8 fL (ref 78.0–100.0)
Platelets: 195 10*3/uL (ref 150–400)
RBC: 4.03 MIL/uL (ref 3.87–5.11)

## 2012-03-04 LAB — COMPREHENSIVE METABOLIC PANEL
ALT: 25 U/L (ref 0–35)
Albumin: 3.7 g/dL (ref 3.5–5.2)
BUN: 23 mg/dL (ref 6–23)
Calcium: 10.3 mg/dL (ref 8.4–10.5)
GFR calc Af Amer: 48 mL/min — ABNORMAL LOW (ref >90)
Glucose, Bld: 102 mg/dL — ABNORMAL HIGH (ref 70–99)
Sodium: 140 mEq/L (ref 135–145)
Total Protein: 6.5 g/dL (ref 6.0–8.3)

## 2012-03-04 LAB — URINALYSIS, ROUTINE W REFLEX MICROSCOPIC
Leukocytes, UA: NEGATIVE
Nitrite: NEGATIVE
Specific Gravity, Urine: 1.01 (ref 1.005–1.030)
pH: 6 (ref 5.0–8.0)

## 2012-03-04 LAB — DIFFERENTIAL
Basophils Relative: 0 % (ref 0–1)
Eosinophils Absolute: 0.1 10*3/uL (ref 0.0–0.7)
Eosinophils Relative: 2 % (ref 0–5)
Lymphs Abs: 2.7 10*3/uL (ref 0.7–4.0)

## 2012-03-04 MED ORDER — OXYCODONE-ACETAMINOPHEN 5-325 MG PO TABS
1.0000 | ORAL_TABLET | Freq: Once | ORAL | Status: AC
  Administered 2012-03-04: 1 via ORAL
  Filled 2012-03-04: qty 1

## 2012-03-04 MED ORDER — SODIUM CHLORIDE 0.9 % IV SOLN
Freq: Once | INTRAVENOUS | Status: AC
  Administered 2012-03-04: 12:00:00 via INTRAVENOUS

## 2012-03-04 MED ORDER — OXYCODONE-ACETAMINOPHEN 5-325 MG PO TABS: 1.0000 | ORAL_TABLET | ORAL | Status: AC | PRN

## 2012-03-04 NOTE — Discharge Instructions (Signed)
Arthralgia Arthralgia is joint pain. A joint is a place where two bones meet. Joint pain can happen for many reasons. The joint can be bruised, stiff, infected, or weak from aging. Pain usually goes away after resting and taking medicine for soreness.  HOME CARE  Rest the joint as told by your doctor.   Keep the sore joint raised (elevated) for the first 24 hours.   Put ice on the joint area.   Put ice in a plastic bag.   Place a towel between your skin and the bag.   Leave the ice on for 15 to 20 minutes, 3 to 4 times a day.   Wear your splint, casting, elastic bandage, or sling as told by your doctor.   Only take medicine as told by your doctor. Do not take aspirin.   Use crutches as told by your doctor. Do not put weight on the joint until told to by your doctor.  GET HELP RIGHT AWAY IF:   You have bruising, puffiness (swelling), or more pain.   Your fingers or toes turn blue or start to lose feeling (numb).   Your medicine does not lessen the pain.   Your pain becomes severe.   You have a temperature by mouth above 102 F (38.9 C), not controlled by medicine.   You cannot move or use the joint.  MAKE SURE YOU:   Understand these instructions.   Will watch your condition.   Will get help right away if you are not doing well or get worse.  Document Released: 09/30/2009 Document Revised: 10/01/2011 Document Reviewed: 09/30/2009 [facility] Patient Information 2012 [place].Hip Pain The hips join the upper legs to the lower pelvis. The bones, cartilage, tendons, and muscles of the hip joint perform a lot of work each day holding your body weight and allowing you to move around. Hip pain is a common symptom. It can range from a minor ache to severe pain on 1 or both hips. Pain may be felt on the inside of the hip joint near the groin, or the outside near the buttocks and upper thigh. There may be swelling or stiffness as well. It occurs more often when a person walks  or performs activity. There are many reasons hip pain can develop. CAUSES  It is important to work with your caregiver to identify the cause since many conditions can impact the bones, cartilage, muscles, and tendons of the hips. Causes for hip pain include:  Broken (fractured) bones.   Separation of the thighbone from the hip socket (dislocation).   Torn cartilage of the hip joint.   Swelling (inflammation) of a tendon (tendonitis), the sac within the hip joint (bursitis), or a joint.   A weakening in the abdominal wall (hernia), affecting the nerves to the hip.   Arthritis in the hip joint or lining of the hip joint.   Pinched nerves in the back, hip, or upper thigh.   A bulging disc in the spine (herniated disc).   Rarely, bone infection or cancer.  DIAGNOSIS  The location of your hip pain will help your caregiver understand what may be causing the pain. A diagnosis is based on your medical history, your symptoms, results from your physical exam, and results from diagnostic tests. Diagnostic tests may include X-ray exams, a computerized magnetic scan (magnetic resonance imaging, MRI), or bone scan. TREATMENT  Treatment will depend on the cause of your hip pain. Treatment may include:  Limiting activities and resting until symptoms  improve.   Crutches or other walking supports (a cane or brace).   Ice, elevation, and compression.   Physical therapy or home exercises.   Shoe inserts or special shoes.   Losing weight.   Medications to reduce pain.   Undergoing surgery.  HOME CARE INSTRUCTIONS   Only take over-the-counter or prescription medicines for pain, discomfort, or fever as directed by your caregiver.   Put ice on the injured area:   Put ice in a plastic bag.   Place a towel between your skin and the bag.   Leave the ice on for 15 to 20 minutes at a time, 3 to 4 times a day.   Keep your leg raised (elevated) when possible to lessen swelling.   Avoid  activities that cause pain.   Follow specific exercises as directed by your caregiver.   Sleep with a pillow between your legs on your most comfortable side.   Record how often you have hip pain, the location of the pain, and what it feels like. This information may be helpful to you and your caregiver.   Ask your caregiver about returning to work or sports and whether you should drive.   Follow up with your caregiver for further exams, therapy, or testing as directed.  SEEK MEDICAL CARE IF:   Your pain or swelling continues or worsens after 1 week.   You are feeling unwell or have chills.   You have increasing difficulty with walking.   You have a loss of sensation or other new symptoms.   You have questions or concerns.  SEEK IMMEDIATE MEDICAL CARE IF:   You cannot put weight on the affected hip.   You have fallen.   You have a sudden increase in pain and swelling in your hip.   You have a fever.  MAKE SURE YOU:   Understand these instructions.   Will watch your condition.   Will get help right away if you are not doing well or get worse.  Document Released: 04/01/2010 Document Revised: 10/01/2011 Document Reviewed: 04/01/2010 [facility] Patient Information 2012 [place].

## 2012-03-04 NOTE — ED Notes (Signed)
Patient arrives via EMS with c/o left groin and leg pain x 1 week. States she is unable to deal with pain any more. Denies injury. No pain to palpation, but raising leg is painful. Alert/oriented x 4.

## 2012-03-04 NOTE — ED Notes (Signed)
Per doctor's request, attempted to ambulate patient again.  She stated she did not think she could due to attempting this before.  Patient was ambulated with some assistance - stated that once she got up from bed and walked she did not have the leg pain as much as laying.  Advised doctor.

## 2012-03-04 NOTE — ED Provider Notes (Signed)
History     CSN: [id number]  Arrival date & time 03/04/12  1031   First MD Initiated Contact with Patient 03/04/12 1041      Chief Complaint  Patient presents with  . Groin Pain  . Leg Pain    (Consider location/radiation/quality/duration/timing/severity/associated sxs/prior treatment) HPI Comments: Patient c/o pain to her left hip and upper thigh for greater than one week.  States the pain is worse with movement and weight bearing and resolves with rest.  States the pain began gradually and now increased.  States she fell more than one week ago but did not think she injured her hip.  She denies fever, swelling, discoloration, numbness, weakness, abd pain or dysuria.    Patient is a 71 y.o. female presenting with hip pain. The history is provided by the patient and a relative.  Hip Pain This is a chronic problem. The current episode started 1 to 4 weeks ago. The problem occurs constantly. The problem has been unchanged. Associated symptoms include arthralgias. Pertinent negatives include no abdominal pain, change in bowel habit, chest pain, chills, fever, joint swelling, nausea, numbness, urinary symptoms, vomiting or weakness. The symptoms are aggravated by standing and walking. She has tried acetaminophen and rest for the symptoms. The treatment provided no relief.    Past Medical History  Diagnosis Date  . Bronchitis   . HIV (human immunodeficiency virus infection)   . 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 Abdominal hysterectomy 1985  . Back surgery   . Tubal ligation     No family history on file.  History  Substance Use Topics  . Smoking status: Never Smoker   . Smokeless tobacco: Never Used  . Alcohol Use: No    OB History    Grav Para Term Preterm Abortions TAB SAB Ect Mult Living                  Review of Systems  Constitutional: Negative for  fever and chills.  Cardiovascular: Negative for chest pain.  Gastrointestinal: Negative for nausea, vomiting, abdominal pain, constipation and change in bowel habit.  Genitourinary: Negative for dysuria, flank pain, decreased urine volume, difficulty urinating and pelvic pain.  Musculoskeletal: Positive for arthralgias and gait problem. Negative for back pain and joint swelling.  Skin: Negative.  Negative for color change.  Neurological: Negative for weakness and numbness.  All other systems reviewed and are negative.    Allergies  Tenofovir disoproxil fumarate; Codeine; Haloperidol lactate; Morphine sulfate; Prochlorperazine edisylate; Promethazine hcl; Propoxyphene-acetaminophen; and Thiothixene  Home Medications   Current Outpatient Rx  Name Route Sig Dispense Refill  . ABACAVIR SULFATE 300 MG PO TABS Oral Take 2 tablets (600 mg total) by mouth daily. 60 tablet 11  . ALBUTEROL SULFATE HFA 108 (90 BASE) MCG/ACT IN AERS Inhalation Inhale 2 puffs into the lungs every 4 (four) hours as needed for wheezing. 1 Inhaler 0  . AMITRIPTYLINE HCL 25 MG PO TABS Oral Take 25 mg by mouth at bedtime.      Marland Kitchen DOCUSATE SODIUM 100 MG PO CAPS Oral Take 100 mg by mouth as needed. For constipation     . LAMIVUDINE 150 MG PO TABS Oral Take 1 tablet (150 mg total) by mouth daily. 30 tablet 11  . LORAZEPAM 2 MG PO TABS Oral Take 2 mg by mouth 2 (two) times  daily.      . NEBIVOLOL HCL 5 MG PO TABS Oral Take 5 mg by mouth daily.      Marland Kitchen NITROFURANTOIN MACROCRYSTAL 100 MG PO CAPS Oral Take 100 mg by mouth at bedtime.      Marland Kitchen RALTEGRAVIR POTASSIUM 400 MG PO TABS Oral Take 1 tablet (400 mg total) by mouth 2 (two) times daily. 60 tablet 11  . VITAMIN B-1 250 MG PO TABS Oral Take 250 mg by mouth daily.      Marland Kitchen VITAMIN C 250 MG PO TABS Oral Take 250 mg by mouth daily.      Marland Kitchen VITAMIN D (ERGOCALCIFEROL) 50000 UNITS PO CAPS Oral Take 50,000 Units by mouth 2 (two) times a week. Patient takes on Monday and friday    .  VITAMIN E 400 UNITS PO CAPS Oral Take 400 Units by mouth daily.        BP 134/62  Pulse 60  Temp(Src) 97.8 F (36.6 C) (Oral)  Resp 18  Ht 5\' 4"  (1.626 m)  Wt 194 lb (87.998 kg)  BMI 33.30 kg/m2  SpO2 99%  Physical Exam  Nursing note and vitals reviewed. Constitutional: She is oriented to person, place, and time.  Cardiovascular: Normal rate, regular rhythm, normal heart sounds and intact distal pulses.   No murmur heard. Pulmonary/Chest: Effort normal and breath sounds normal.  Abdominal: Soft. Bowel sounds are normal. She exhibits no distension and no mass. There is no tenderness. There is no rebound and no guarding.  Musculoskeletal: She exhibits tenderness. She exhibits no edema.       Left hip: She exhibits decreased range of motion and tenderness. She exhibits normal strength, no bony tenderness, no swelling, no crepitus, no deformity and no laceration.       Legs: Neurological: She is alert and oriented to person, place, and time. No cranial nerve deficit or sensory deficit. She exhibits normal muscle tone. Coordination and gait normal.  Reflex Scores:      Patellar reflexes are 2+ on the right side and 2+ on the left side.      Achilles reflexes are 2+ on the right side and 2+ on the left side. Skin: Skin is warm and dry. No erythema.    ED Course  Procedures (including critical care time)    Results for orders placed during the hospital encounter of 03/04/12  CBC      Component Value Range   WBC 6.7  4.0 - 10.5 (K/uL)   RBC 4.03  3.87 - 5.11 (MIL/uL)   Hemoglobin 13.6  12.0 - 15.0 (g/dL)   HCT 39.4  36.0 - 46.0 (%)   MCV 97.8  78.0 - 100.0 (fL)   MCH 33.7  26.0 - 34.0 (pg)   MCHC 34.5  30.0 - 36.0 (g/dL)   RDW 12.4  11.5 - 15.5 (%)   Platelets 195  150 - 400 (K/uL)  DIFFERENTIAL      Component Value Range   Neutrophils Relative 48  43 - 77 (%)   Neutro Abs 3.2  1.7 - 7.7 (K/uL)   Lymphocytes Relative 41  12 - 46 (%)   Lymphs Abs 2.7  0.7 - 4.0 (K/uL)    Monocytes Relative 10  3 - 12 (%)   Monocytes Absolute 0.7  0.1 - 1.0 (K/uL)   Eosinophils Relative 2  0 - 5 (%)   Eosinophils Absolute 0.1  0.0 - 0.7 (K/uL)   Basophils Relative 0  0 - 1 (%)  Basophils Absolute 0.0  0.0 - 0.1 (K/uL)  COMPREHENSIVE METABOLIC PANEL      Component Value Range   Sodium 140  135 - 145 (mEq/L)   Potassium 3.8  3.5 - 5.1 (mEq/L)   Chloride 105  96 - 112 (mEq/L)   CO2 24  19 - 32 (mEq/L)   Glucose, Bld 102 (*) 70 - 99 (mg/dL)   BUN 23  6 - 23 (mg/dL)   Creatinine, Ser 1.27 (*) 0.50 - 1.10 (mg/dL)   Calcium 10.3  8.4 - 10.5 (mg/dL)   Total Protein 6.5  6.0 - 8.3 (g/dL)   Albumin 3.7  3.5 - 5.2 (g/dL)   AST 17  0 - 37 (U/L)   ALT 25  0 - 35 (U/L)   Alkaline Phosphatase 75  39 - 117 (U/L)   Total Bilirubin 0.4  0.3 - 1.2 (mg/dL)   GFR calc non Af Amer 41 (*) >90 (mL/min)   GFR calc Af Amer 48 (*) >90 (mL/min)  URINALYSIS, ROUTINE W REFLEX MICROSCOPIC      Component Value Range   Color, Urine YELLOW  YELLOW    APPearance CLEAR  CLEAR    Specific Gravity, Urine 1.010  1.005 - 1.030    pH 6.0  5.0 - 8.0    Glucose, UA NEGATIVE  NEGATIVE (mg/dL)   Hgb urine dipstick NEGATIVE  NEGATIVE    Bilirubin Urine NEGATIVE  NEGATIVE    Ketones, ur NEGATIVE  NEGATIVE (mg/dL)   Protein, ur NEGATIVE  NEGATIVE (mg/dL)   Urobilinogen, UA 0.2  0.0 - 1.0 (mg/dL)   Nitrite NEGATIVE  NEGATIVE    Leukocytes, UA NEGATIVE  NEGATIVE     Dg Hip Complete Left  03/04/2012  *RADIOLOGY REPORT*  Clinical Data: Left hip pain for 1.5 weeks  LEFT HIP - COMPLETE 2+ VIEW  Comparison: Pelvic radiograph 07/04/2010  Findings: No mild osseous demineralization. Hip and SI joint spaces preserved. No acute fracture, dislocation, or bone destruction.  IMPRESSION: No acute abnormalities.  Original Report Authenticated By: Burnetta Sabin, M.D.      MDM    Previous medical charts, nursing notes and vitals signs from this visit were reviewed by me   All laboratory results and/or imaging  results performed on this visit, if applicable, were reviewed by me and discussed with the patient and/or parent as well as recommendation for follow-up    [place] ED:  Percocet po  Patient has tenderness to palpation of the lateral and anterior left hip. Pain is also reproduced with internal and external rotation of the hip joint and straight-leg raise on the left. Pain appears to be musculoskeletal in origin I do not suspect septic joint or infectious process. Patient was also evaluated by the EDP and care plan was discussed. Patient ambulated in the emergency department without any difficulty her gait was steady no focal neuro deficits on exam. Patient agrees to close followup with her primary care physician on Monday avulsive eye surgery return to ER for any worsening symptoms.    PRESCRIPTIONS GIVEN AT DISCHARGE:  Percocet #20, flexeril    Pt stable in ED with no significant deterioration in condition. Pt feels improved after observation and/or treatment in ED. Patient / Family / Caregiver understand and agree with initial ED impression and plan with expectations set for ED visit.  Patient agrees to return to ED for any worsening symptoms          Julian Askin L. [place], [place] 03/07/12 1550

## 2012-03-04 NOTE — ED Notes (Signed)
Tried to ambulate pt; pt states she is unable to get up because "it hurts too much"; informed PA and nurse

## 2012-03-07 NOTE — ED Provider Notes (Addendum)
Medical screening examination/treatment/procedure(s) were conducted as a shared visit with non-physician practitioner(s) and myself.  I personally evaluated the patient during the encounter  Pain on palpation of L groin and L hip.  Also with pain on ROM.  No palpable nodes.  No concern for septic joint.  Pain control and ambulate.  Dc home  Trisha Mangle, MD 03/07/12 [phone]

## 2012-03-22 ENCOUNTER — Other Ambulatory Visit: Payer: Self-pay | Admitting: *Deleted

## 2012-03-22 DIAGNOSIS — B20 Human immunodeficiency virus [HIV] disease: Secondary | ICD-10-CM

## 2012-03-22 MED ORDER — RALTEGRAVIR POTASSIUM 400 MG PO TABS: 400.0000 mg | ORAL_TABLET | Freq: Two times a day (BID) | ORAL | Status: DC

## 2012-03-22 MED ORDER — ABACAVIR SULFATE 300 MG PO TABS: 600.0000 mg | ORAL_TABLET | Freq: Every day | ORAL | Status: DC

## 2012-03-22 MED ORDER — LAMIVUDINE 150 MG PO TABS: 150.0000 mg | ORAL_TABLET | Freq: Every day | ORAL | Status: DC

## 2012-06-14 ENCOUNTER — Other Ambulatory Visit (INDEPENDENT_AMBULATORY_CARE_PROVIDER_SITE_OTHER): Payer: Medicare Other

## 2012-06-14 DIAGNOSIS — R3589 Other polyuria: Secondary | ICD-10-CM

## 2012-06-14 DIAGNOSIS — R358 Other polyuria: Secondary | ICD-10-CM

## 2012-06-14 DIAGNOSIS — B20 Human immunodeficiency virus [HIV] disease: Secondary | ICD-10-CM

## 2012-06-14 DIAGNOSIS — E785 Hyperlipidemia, unspecified: Secondary | ICD-10-CM

## 2012-06-14 LAB — CBC WITH DIFFERENTIAL/PLATELET
Basophils Absolute: 0 10*3/uL (ref 0.0–0.1)
Basophils Relative: 1 % (ref 0–1)
HCT: 39.3 % (ref 36.0–46.0)
MCHC: 34.4 g/dL (ref 30.0–36.0)
Monocytes Absolute: 0.6 10*3/uL (ref 0.1–1.0)
Neutro Abs: 3 10*3/uL (ref 1.7–7.7)
RDW: 14.2 % (ref 11.5–15.5)

## 2012-06-15 LAB — LIPID PANEL
Cholesterol: 257 mg/dL — ABNORMAL HIGH (ref 0–200)
HDL: 35 mg/dL — ABNORMAL LOW (ref >39)
Triglycerides: 459 mg/dL — ABNORMAL HIGH (ref <150)

## 2012-06-15 LAB — COMPLETE METABOLIC PANEL WITH GFR
ALT: 20 U/L (ref 0–35)
BUN: 28 mg/dL — ABNORMAL HIGH (ref 6–23)
CO2: 24 mEq/L (ref 19–32)
Calcium: 9.7 mg/dL (ref 8.4–10.5)
Chloride: 104 mEq/L (ref 96–112)
Creat: 1.37 mg/dL — ABNORMAL HIGH (ref 0.50–1.10)
GFR, Est African American: 45 mL/min — ABNORMAL LOW
GFR, Est Non African American: 39 mL/min — ABNORMAL LOW
Glucose, Bld: 83 mg/dL (ref 70–99)

## 2012-06-15 LAB — T-HELPER CELL (CD4) - (RCID CLINIC ONLY): CD4 % Helper T Cell: 31 % — ABNORMAL LOW (ref 33–55)

## 2012-06-16 LAB — HIV-1 RNA QUANT-NO REFLEX-BLD
HIV 1 RNA Quant: <20 copies/mL (ref <20)
HIV-1 RNA Quant, Log: <1.3 {Log} (ref <1.30)

## 2012-06-28 ENCOUNTER — Encounter: Payer: Self-pay | Admitting: Infectious Disease

## 2012-06-28 ENCOUNTER — Ambulatory Visit (INDEPENDENT_AMBULATORY_CARE_PROVIDER_SITE_OTHER): Payer: Medicare Other | Admitting: Infectious Disease

## 2012-06-28 VITALS — BP 124/78 | HR 72 | Temp 97.8°F | Wt 201.0 lb

## 2012-06-28 DIAGNOSIS — F329 Major depressive disorder, single episode, unspecified: Secondary | ICD-10-CM

## 2012-06-28 DIAGNOSIS — B20 Human immunodeficiency virus [HIV] disease: Secondary | ICD-10-CM

## 2012-06-28 DIAGNOSIS — R5383 Other fatigue: Secondary | ICD-10-CM

## 2012-06-28 DIAGNOSIS — E785 Hyperlipidemia, unspecified: Secondary | ICD-10-CM

## 2012-06-28 MED ORDER — RALTEGRAVIR POTASSIUM 400 MG PO TABS: 400.0000 mg | ORAL_TABLET | Freq: Two times a day (BID) | ORAL | Status: DC

## 2012-06-28 NOTE — Assessment & Plan Note (Signed)
High TG. ? Been on fibrate?

## 2012-06-28 NOTE — Patient Instructions (Addendum)
I am going to give you a new prescription for a drug called "Tivicay"= dolutegravir  It is once a day tablet that would replace your twice a day isentress= raltegravir (pink pill)  If medicare will not cover the tivicay then keep taking the isentress twice daily

## 2012-06-28 NOTE — Assessment & Plan Note (Signed)
On elavil

## 2012-06-28 NOTE — Assessment & Plan Note (Addendum)
Perfect control  WILL CHANGE HOWEVER FOR SIMPLICITY TO:  TIVICAY 50 MG DAILY ABACAVIR 600MG  DAILY  AND LAMIVUDINE 150MG  DAILY

## 2012-06-28 NOTE — Assessment & Plan Note (Signed)
Check free T4, T3 and TSH

## 2012-06-28 NOTE — Progress Notes (Signed)
  Subjective:    Patient ID: [name], female    DOB: 1941/06/19, 71 y.o.   MRN: [id number]  HPI  71 y.o. female who is doing superbly well on their antiviral regimen with isentress bid, abacavir 600mg  daily 150mg  daily of lamivudine, with undetectable viral load and health cd4 count . She is concerned about feeling fatigued. She states that Dr. Melina Copa had dx her with hypothyroidism but pt never took meds because TSH was normal here. She continues to c/o fatigue. I spent greater than 45 minutes with the patient including greater than 50% of time in face to face counsel of the patient and in coordination of their care.    Review of Systems  Constitutional: Positive for fatigue. Negative for fever, chills, diaphoresis, activity change, appetite change and unexpected weight change.  HENT: Negative for congestion, sore throat, rhinorrhea, sneezing, trouble swallowing and sinus pressure.   Eyes: Negative for photophobia and visual disturbance.  Respiratory: Negative for cough, chest tightness, shortness of breath, wheezing and stridor.   Cardiovascular: Negative for chest pain, palpitations and leg swelling.  Gastrointestinal: Negative for nausea, vomiting, abdominal pain, diarrhea, constipation, blood in stool, abdominal distention and anal bleeding.  Genitourinary: Negative for dysuria, hematuria, flank pain and difficulty urinating.  Musculoskeletal: Negative for myalgias, back pain, joint swelling, arthralgias and gait problem.  Skin: Negative for color change, pallor, rash and wound.  Neurological: Negative for dizziness, tremors, weakness and light-headedness.  Hematological: Negative for adenopathy. Does not bruise/bleed easily.  Psychiatric/Behavioral: Negative for behavioral problems, confusion, disturbed wake/sleep cycle, dysphoric mood, decreased concentration and agitation.       Objective:   Physical Exam  Constitutional: She is oriented to person, place, and time. She  appears well-developed and well-nourished. No distress.  HENT:  Head: Normocephalic and atraumatic.  Mouth/Throat: Oropharynx is clear and moist. No oropharyngeal exudate.  Eyes: Conjunctivae and EOM are normal. Pupils are equal, round, and reactive to light. No scleral icterus.  Neck: Normal range of motion. Neck supple. No JVD present.  Cardiovascular: Normal rate, regular rhythm and normal heart sounds.  Exam reveals no gallop and no friction rub.   No murmur heard. Pulmonary/Chest: Effort normal and breath sounds normal. No respiratory distress. She has no wheezes. She has no rales. She exhibits no tenderness.  Abdominal: She exhibits no distension and no mass. There is no tenderness. There is no rebound and no guarding.  Musculoskeletal: She exhibits no edema and no tenderness.  Lymphadenopathy:    She has no cervical adenopathy.  Neurological: She is alert and oriented to person, place, and time. She has normal reflexes. She exhibits normal muscle tone. Coordination normal.  Skin: Skin is warm and dry. She is not diaphoretic. No erythema. No pallor.  Psychiatric: She has a normal mood and affect. Her behavior is normal. Judgment and thought content normal.          Assessment & Plan:  HIV DISEASE Perfect control  WILL CHANGE HOWEVER FOR SIMPLICITY TO:  TIVICAY 50 MG DAILY ABACAVIR 600MG  DAILY  AND LAMIVUDINE 150MG  DAILY   HYPERLIPIDEMIA High TG. ? Been on fibrate?  DEPRESSION On elavil  FATIGUE Check free T4, T3 and TSH   Pt may have Hypothyroidism. I was unaware PCP had labs indicating this

## 2012-08-27 ENCOUNTER — Emergency Department (HOSPITAL_COMMUNITY): Payer: Medicare Other

## 2012-08-27 ENCOUNTER — Encounter (HOSPITAL_COMMUNITY): Payer: Self-pay | Admitting: *Deleted

## 2012-08-27 ENCOUNTER — Emergency Department (HOSPITAL_COMMUNITY)
Admission: EM | Admit: 2012-08-27 | Discharge: 2012-08-27 | Disposition: A | Discharge status: Home / Self Care | Payer: Medicare Other | Attending: Emergency Medicine | Admitting: Emergency Medicine

## 2012-08-27 DIAGNOSIS — Z8709 Personal history of other diseases of the respiratory system: Secondary | ICD-10-CM | POA: Insufficient documentation

## 2012-08-27 DIAGNOSIS — K649 Unspecified hemorrhoids: Secondary | ICD-10-CM | POA: Insufficient documentation

## 2012-08-27 DIAGNOSIS — B20 Human immunodeficiency virus [HIV] disease: Secondary | ICD-10-CM | POA: Insufficient documentation

## 2012-08-27 DIAGNOSIS — Z79899 Other long term (current) drug therapy: Secondary | ICD-10-CM | POA: Insufficient documentation

## 2012-08-27 DIAGNOSIS — R109 Unspecified abdominal pain: Secondary | ICD-10-CM | POA: Insufficient documentation

## 2012-08-27 DIAGNOSIS — R110 Nausea: Secondary | ICD-10-CM | POA: Insufficient documentation

## 2012-08-27 LAB — COMPREHENSIVE METABOLIC PANEL
BUN: 27 mg/dL — ABNORMAL HIGH (ref 6–23)
CO2: 28 mEq/L (ref 19–32)
Calcium: 10.3 mg/dL (ref 8.4–10.5)
Creatinine, Ser: 1.38 mg/dL — ABNORMAL HIGH (ref 0.50–1.10)
GFR calc Af Amer: 43 mL/min — ABNORMAL LOW (ref >90)
GFR calc non Af Amer: 37 mL/min — ABNORMAL LOW (ref >90)
Glucose, Bld: 97 mg/dL (ref 70–99)

## 2012-08-27 LAB — CBC WITH DIFFERENTIAL/PLATELET
Eosinophils Relative: 2 % (ref 0–5)
HCT: 44.4 % (ref 36.0–46.0)
Lymphocytes Relative: 42 % (ref 12–46)
Lymphs Abs: 3.4 10*3/uL (ref 0.7–4.0)
MCV: 100 fL (ref 78.0–100.0)
Monocytes Absolute: 0.9 10*3/uL (ref 0.1–1.0)
Monocytes Relative: 12 % (ref 3–12)
RBC: 4.44 MIL/uL (ref 3.87–5.11)
RDW: 12.6 % (ref 11.5–15.5)
WBC: 8.1 10*3/uL (ref 4.0–10.5)

## 2012-08-27 LAB — URINALYSIS, ROUTINE W REFLEX MICROSCOPIC
Hgb urine dipstick: NEGATIVE
Ketones, ur: NEGATIVE mg/dL
Leukocytes, UA: NEGATIVE
Protein, ur: NEGATIVE mg/dL
Urobilinogen, UA: 0.2 mg/dL (ref 0.0–1.0)

## 2012-08-27 MED ORDER — HYDROCORTISONE ACETATE 25 MG RE SUPP: 25.0000 mg | Freq: Two times a day (BID) | RECTAL | Status: DC

## 2012-08-27 MED ORDER — SODIUM CHLORIDE 0.9 % IV SOLN
Freq: Once | INTRAVENOUS | Status: AC
  Administered 2012-08-27: 12:00:00 via INTRAVENOUS

## 2012-08-27 MED ORDER — IOHEXOL 300 MG/ML  SOLN
80.0000 mL | Freq: Once | INTRAMUSCULAR | Status: AC | PRN
  Administered 2012-08-27: 80 mL via INTRAVENOUS

## 2012-08-27 NOTE — ED Provider Notes (Cosign Needed)
History  This chart was scribed for Mylinda Latina III, MD by Kevan Rosebush. This patient was seen in room APA09/APA09 and the patient's care was started at 11:06.   CSN: [id number]  Arrival date & time 08/27/12  1042   First MD Initiated Contact with Patient 08/27/12 1106      Chief Complaint  Patient presents with  . Rectal Bleeding    (Consider location/radiation/quality/duration/timing/severity/associated sxs/prior treatment) The history is provided by the patient. No language interpreter was used.  [name] is a 71 y.o. female who presents to the Emergency Department complaining of rectal bleeding (bright red color), pain, and itching for the past 2 days, associated with her hemorrhoids. Pt reports some associated nausea and abdominal pain but denies any chest pain, emesis, SOB, or difficulty breathing. Pt reports using preparation H suppositories with no relief from symptoms. Pt reports she has occasional constipation, associated with her diverticulitis. Pt has been treated for hemorrhoids in the past, by Dr. Gala Romney. Pt also has a h/o bronchitis, HIV, and Kidney disease and a procedural h/o abdominal hysterectomy, back surgery, and tubal ligation. Pt takes Ziagen, an albuterol inhaler, Elavail, Colace, Epivir, Ativan, Bystolic, Isentress, and vitamins.  Past Medical History  Diagnosis Date  . Bronchitis   . HIV (human immunodeficiency virus infection)   . 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 Abdominal hysterectomy 1985  . Back surgery   . Tubal ligation     No family history on file.  History  Substance Use Topics  . Smoking status: Never Smoker   . Smokeless tobacco: Never Used  . Alcohol Use: No    OB History    Grav Para Term Preterm Abortions TAB SAB Ect Mult Living                  Review of Systems  Constitutional: Negative for fatigue.    HENT: Negative for congestion.   Eyes: Negative for discharge.  Respiratory: Negative for cough and shortness of breath.   Cardiovascular: Negative for chest pain.  Gastrointestinal: Positive for nausea, abdominal pain and anal bleeding. Negative for vomiting.  Genitourinary: Negative for frequency and hematuria.  Musculoskeletal: Negative for back pain.  Skin: Negative for rash.  Neurological: Negative for seizures and headaches.  Hematological: Negative.   Psychiatric/Behavioral: Negative for hallucinations.  All other systems reviewed and are negative.    Allergies  Tenofovir disoproxil fumarate; Codeine; Haloperidol lactate; Morphine sulfate; Prochlorperazine edisylate; Promethazine hcl; Propoxyphene-acetaminophen; and Thiothixene  Home Medications   Current Outpatient Rx  Name Route Sig Dispense Refill  . ABACAVIR SULFATE 300 MG PO TABS Oral Take 2 tablets (600 mg total) by mouth daily. 60 tablet 6  . ALBUTEROL SULFATE HFA 108 (90 BASE) MCG/ACT IN AERS Inhalation Inhale 2 puffs into the lungs every 4 (four) hours as needed for wheezing. 1 Inhaler 0  . AMITRIPTYLINE HCL 25 MG PO TABS Oral Take 25 mg by mouth at bedtime.      Marland Kitchen DOCUSATE SODIUM 100 MG PO CAPS Oral Take 100 mg by mouth as needed. For constipation     . LAMIVUDINE 150 MG PO TABS Oral Take 1 tablet (150 mg total) by mouth daily. 30 tablet 6  . LORAZEPAM 2 MG PO TABS Oral Take 2 mg by mouth 2 (two) times daily.      . NEBIVOLOL  HCL 5 MG PO TABS Oral Take 5 mg by mouth daily.      Marland Kitchen RALTEGRAVIR POTASSIUM 400 MG PO TABS Oral Take 1 tablet (400 mg total) by mouth 2 (two) times daily. 60 tablet 6  . RED YEAST RICE 600 MG PO TABS Oral Take 600 mg by mouth daily.    Marland Kitchen VITAMIN B-1 250 MG PO TABS Oral Take 250 mg by mouth daily.      Marland Kitchen VITAMIN C 250 MG PO TABS Oral Take 250 mg by mouth daily.      Marland Kitchen VITAMIN D (ERGOCALCIFEROL) 50000 UNITS PO CAPS Oral Take 50,000 Units by mouth 2 (two) times a week. Patient takes on Monday  and friday    . VITAMIN E 400 UNITS PO CAPS Oral Take 400 Units by mouth daily.        Triage Vitals: BP 136/72  Pulse 82  Temp 98.3 F (36.8 C)  Resp 20  Ht 5\' 4"  (1.626 m)  Wt 200 lb (90.719 kg)  BMI 34.33 kg/m2  SpO2 99%  Physical Exam  Nursing note and vitals reviewed. Constitutional: She is oriented to person, place, and time. She appears well-developed and well-nourished. No distress.  HENT:  Head: Normocephalic and atraumatic.  Eyes: EOM are normal. Pupils are equal, round, and reactive to light.  Neck: Neck supple. No tracheal deviation present.  Cardiovascular: Normal rate, regular rhythm and normal heart sounds.   No murmur heard. Pulmonary/Chest: Effort normal and breath sounds normal. No respiratory distress.  Abdominal: Soft. She exhibits no distension and no mass. There is tenderness.       LLQ abdominal tenderness. No mass. Abdomen is protuberant.  Genitourinary: Rectal exam shows no mass.       Rectal exam: no protruding hemorrhoids. No mass. Stool is hemoccult negative.  Musculoskeletal: Normal range of motion. She exhibits no edema.  Neurological: She is alert and oriented to person, place, and time.  Skin: Skin is warm and dry.  Psychiatric: She has a normal mood and affect.    ED Course  Procedures (including critical care time) DIAGNOSTIC STUDIES: Oxygen Saturation is 99% on room air, normal by my interpretation.    COORDINATION OF CARE: 11:23--I evaluated the patient and we discussed a treatment plan including abdominal CT scan to which the pt agreed. Pt denies needing anything for pain at this time.   2:13 PM Results for orders placed during the hospital encounter of 08/27/12  CBC WITH DIFFERENTIAL      Component Value Range   WBC 8.1  4.0 - 10.5 K/uL   RBC 4.44  3.87 - 5.11 MIL/uL   Hemoglobin 15.5 (*) 12.0 - 15.0 g/dL   HCT 44.4  36.0 - 46.0 %   MCV 100.0  78.0 - 100.0 fL   MCH 34.9 (*) 26.0 - 34.0 pg   MCHC 34.9  30.0 - 36.0 g/dL   RDW  12.6  11.5 - 15.5 %   Platelets 194  150 - 400 K/uL   Neutrophils Relative 44  43 - 77 %   Neutro Abs 3.5  1.7 - 7.7 K/uL   Lymphocytes Relative 42  12 - 46 %   Lymphs Abs 3.4  0.7 - 4.0 K/uL   Monocytes Relative 12  3 - 12 %   Monocytes Absolute 0.9  0.1 - 1.0 K/uL   Eosinophils Relative 2  0 - 5 %   Eosinophils Absolute 0.2  0.0 - 0.7 K/uL   Basophils Relative 1  0 - 1 %   Basophils Absolute 0.1  0.0 - 0.1 K/uL  COMPREHENSIVE METABOLIC PANEL      Component Value Range   Sodium 142  135 - 145 mEq/L   Potassium 4.6  3.5 - 5.1 mEq/L   Chloride 106  96 - 112 mEq/L   CO2 28  19 - 32 mEq/L   Glucose, Bld 97  70 - 99 mg/dL   BUN 27 (*) 6 - 23 mg/dL   Creatinine, Ser 1.38 (*) 0.50 - 1.10 mg/dL   Calcium 10.3  8.4 - 10.5 mg/dL   Total Protein 7.5  6.0 - 8.3 g/dL   Albumin 4.1  3.5 - 5.2 g/dL   AST 17  0 - 37 U/L   ALT 24  0 - 35 U/L   Alkaline Phosphatase 68  39 - 117 U/L   Total Bilirubin 0.5  0.3 - 1.2 mg/dL   GFR calc non Af Amer 37 (*) >90 mL/min   GFR calc Af Amer 43 (*) >90 mL/min  LIPASE, BLOOD      Component Value Range   Lipase 45  11 - 59 U/L  URINALYSIS, ROUTINE W REFLEX MICROSCOPIC      Component Value Range   Color, Urine YELLOW  YELLOW   APPearance HAZY (*) CLEAR   Specific Gravity, Urine 1.025  1.005 - 1.030   pH 6.0  5.0 - 8.0   Glucose, UA NEGATIVE  NEGATIVE mg/dL   Hgb urine dipstick NEGATIVE  NEGATIVE   Bilirubin Urine NEGATIVE  NEGATIVE   Ketones, ur NEGATIVE  NEGATIVE mg/dL   Protein, ur NEGATIVE  NEGATIVE mg/dL   Urobilinogen, UA 0.2  0.0 - 1.0 mg/dL   Nitrite NEGATIVE  NEGATIVE   Leukocytes, UA NEGATIVE  NEGATIVE   Ct Abdomen Pelvis W Contrast  08/27/2012  *RADIOLOGY REPORT*  Clinical Data:  left lower quadrant pain and rectal bleeding  CT ABDOMEN AND PELVIS WITH CONTRAST  Technique:  Multidetector CT imaging of the abdomen and pelvis was performed following the standard protocol during bolus administration of intravenous contrast.  Contrast: 66mL  OMNIPAQUE IOHEXOL 300 MG/ML  SOLN  Comparison: 02/22/2008  Findings:  Lung bases:  Lung bases are clear.  No pericardial or pleural effusion.  Heart size appears normal.  Abdomen/pelvis:  No focal liver abnormality.  Prior cholecystectomy. The pancreas is normal.  No biliary dilatation.  Normal appearance of the spleen.  The right adrenal gland is normal.  Angiomyolipoma in the left adrenal gland is identified measuring 1.9 cm.  Mild bilateral renal atrophy.  Small cyst is noted within the upper pole of the left kidney.  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  The urinary bladder appears normal.  Prior hysterectomy.  No enlarged upper abdominal lymph nodes.  No pelvic or inguinal adenopathy identified.  There is no free fluid or fluid collections within the abdomen or pelvis.  The patient has a small hiatal hernia.  The stomach and small bowel loops are unremarkable.   Scattered distal colonic diverticula identified.  No acute inflammation.  Bones/Musculoskeletal:  Small periumbilical hernia is identified review of the visualized osseous structures is significant for mild spondylosis.  IMPRESSION:  1.  No acute findings identified within the abdomen or pelvis. 2.  Left adrenal gland myelolipoma.   Original Report Authenticated By: Kerby Moors, M.D.     Lab tests showed no evidence of significant bleeding, and CT abdomen/pelvis showed no significant cause of rectal bleeding.  Will treat  for hemorrhoids with Anusol HC suppositories bid.    1. Hemorrhoids      I personally performed the services described in this documentation, which was scribed in my presence. The recorded information has been reviewed and considered.  Katy Apo, MD     Mylinda Latina III, MD 08/27/12 [phone]

## 2012-08-27 NOTE — ED Notes (Addendum)
Pt c/o rectal bleeding from hemorrhoids, pt states that she has had problems with her hemorrhoids before with bleeding, bleeding, pain and itching started two days ago, pt has using preparation H and  with no success. Pt states that the bleeding is bright red in color.

## 2012-08-27 NOTE — ED Notes (Signed)
Patient c/o arm hurting at IV site. IV infiltrated. IV removed from RAC and 20 Gauge IV initiated in [place] by RN.

## 2012-08-27 NOTE — ED Notes (Signed)
MD at bedside. 

## 2012-08-27 NOTE — ED Notes (Signed)
Patient returned from xray.

## 2012-08-27 NOTE — ED Notes (Signed)
Patient with no complaints at this time. Respirations even and unlabored. Skin warm/dry. Discharge instructions reviewed with patient at this time. Patient given opportunity to voice concerns/ask questions. IV removed per policy and band-aid applied to site. Patient discharged at this time and left Emergency Department via wheelchair.  

## 2012-08-28 LAB — URINE CULTURE: Culture: NO GROWTH

## 2012-08-31 ENCOUNTER — Encounter: Payer: Self-pay | Admitting: Internal Medicine

## 2012-09-01 ENCOUNTER — Encounter (HOSPITAL_COMMUNITY)
Admission: RE | Admit: 2012-09-01 | Discharge: 2012-09-01 | Disposition: A | Discharge status: Home / Self Care | Payer: Medicare Other | Source: Ambulatory Visit | Attending: General Surgery | Admitting: General Surgery

## 2012-09-01 ENCOUNTER — Encounter (HOSPITAL_COMMUNITY): Payer: Self-pay

## 2012-09-01 ENCOUNTER — Encounter: Payer: Self-pay | Admitting: Urgent Care

## 2012-09-01 ENCOUNTER — Ambulatory Visit (INDEPENDENT_AMBULATORY_CARE_PROVIDER_SITE_OTHER): Payer: Medicare Other | Admitting: Urgent Care

## 2012-09-01 ENCOUNTER — Other Ambulatory Visit: Payer: Self-pay

## 2012-09-01 VITALS — BP 134/88 | HR 67 | Temp 97.4°F | Wt 204.6 lb

## 2012-09-01 DIAGNOSIS — K649 Unspecified hemorrhoids: Secondary | ICD-10-CM | POA: Insufficient documentation

## 2012-09-01 HISTORY — DX: Personal history of transient ischemic attack (TIA), and cerebral infarction without residual deficits: Z86.73

## 2012-09-01 HISTORY — DX: Major depressive disorder, single episode, unspecified: F32.9

## 2012-09-01 HISTORY — DX: Anxiety disorder, unspecified: F41.9

## 2012-09-01 HISTORY — DX: Unspecified osteoarthritis, unspecified site: M19.90

## 2012-09-01 HISTORY — DX: Depression, unspecified: F32.A

## 2012-09-01 HISTORY — DX: Chronic obstructive pulmonary disease, unspecified: J44.9

## 2012-09-01 HISTORY — DX: Essential (primary) hypertension: I10

## 2012-09-01 HISTORY — DX: Polyneuropathy, unspecified: G62.9

## 2012-09-01 LAB — SURGICAL PCR SCREEN
MRSA, PCR: NEGATIVE
Staphylococcus aureus: NEGATIVE

## 2012-09-01 NOTE — Patient Instructions (Addendum)
Appt with surgeon for hemorrhoids-Go immediately to Dr Adline Mango office

## 2012-09-01 NOTE — Assessment & Plan Note (Addendum)
[name] is a pleasant 71 y.o. female with painful thrombosed hemorrhoid.  Colonoscopy up to date.    Referral to Dr Arnoldo Morale who has so kindly agreed to see her now.

## 2012-09-01 NOTE — Progress Notes (Signed)
Referring Provider: Octavio Graves, DO Primary Care Physician:  Octavio Graves, DO Primary Gastroenterologist:  Dr. Barney Drain  Chief Complaint  Patient presents with  . Hemorrhoids    medication did not help hemorrhoids from ED    HPI:  [name] is a 71 y.o. female here for follow up for hemorrhoids.  Went to ER.  She has tried anusol [facility] suppositories without relief.  C/o severe proctalgia.  +scant bright red blood on toilet tissue.  Denies abdominal pain, nausea or vomiting.  Last colonoscopy showed internal & external hemorrhoids, anal papilla & left sided diverticula.  Past Medical History  Diagnosis Date  . Bronchitis   . HIV (human immunodeficiency virus infection)   . 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 Abdominal hysterectomy 1985  . Back surgery   . Tubal ligation   . Esophagogastroduodenoscopy 08/10/2002      NUR: Mild changes of reflux esophagitis limited to gastroesophageal junction  No evidence of ring, stricture, or esophageal candidiasis dysphagia/ Gastritis, possibly H. pylori induced  . Colonoscopy 08/10/2002    NUR: Normal colonoscopy except for some hemorrhoids and some erythema at the dentate line  . Esophagogastroduodenoscopy 12/25/2002    NUR: Erosive antral gastritis.  The degree of gastritis is more significant than her last exam/ Normal examination of the esophagus/ Esophageal dilatation performed by passing 32 and [address] [place]  . Colonoscopy 12/10/2006    Ed[place]:Diffuse colitis in the transverse and descending colon/This is not entirely typical of ischemic colitis or her HIV positive/ disease.  We need to be concerned about other causes  . Esophagogastroduodenoscopy 11/13/2010    RMR: Circumferential distal esophageal erosions with soft peptic stricture, consistent with erosive reflux esophagitis or stricture  formation, status  post dilation as described above/  Small hiatal hernia/ Tiny antral erosions, otherwise normal stomach D1 and D2  . Colonoscopy 01/19/2011    RMR: Internal and external hemorrhoids, likely source of hematochezia anal papilla, otherwise normal rectal mucosa/ Left-sided diverticula.  Cecal polyp with status post cold snare polypectomy.  Remainder of colonic mucosa appeared normal    Current Outpatient Prescriptions  Medication Sig Dispense Refill  . albuterol (PROVENTIL HFA;VENTOLIN HFA) 108 (90 BASE) MCG/ACT inhaler Inhale 2 puffs into the lungs every 6 (six) hours as needed.      Marland Kitchen amitriptyline (ELAVIL) 25 MG tablet Take 25 mg by mouth at bedtime.        . docusate sodium (COLACE) 100 MG capsule Take 100 mg by mouth as needed. For constipation       . lamiVUDine (EPIVIR) 150 MG tablet Take 1 tablet (150 mg total) by mouth daily.  30 tablet  6  . LORazepam (ATIVAN) 2 MG tablet Take 2 mg by mouth 2 (two) times daily.        . nebivolol (BYSTOLIC) 5 MG tablet Take 5 mg by mouth daily.        Marland Kitchen oxyCODONE-acetaminophen (PERCOCET/ROXICET) 5-325 MG per tablet Take 1 tablet by mouth every 4 (four) hours as needed. For pain      . raltegravir (ISENTRESS) 400 MG tablet Take 1 tablet (400 mg total) by mouth 2 (two) times daily.  60 tablet  6  . Thiamine HCl (VITAMIN B-1) 250 MG tablet Take 250 mg by mouth daily.        . Vitamin D,  Ergocalciferol, (DRISDOL) 50000 UNITS CAPS Take 50,000 Units by mouth 2 (two) times a week. Patient takes on Monday and friday      . vitamin E 400 UNIT capsule Take 400 Units by mouth daily.        Marland Kitchen abacavir (ZIAGEN) 300 MG tablet Take 2 tablets (600 mg total) by mouth daily.  60 tablet  6  . hydrocortisone (ANUSOL-HC) 25 MG suppository Place 1 suppository (25 mg total) rectally 2 (two) times daily.  12 suppository  0    Allergies as of 09/01/2012 - Review Complete 09/01/2012  Allergen Reaction Noted  . Bee venom Anaphylaxis 08/27/2012  . Tenofovir disoproxil fumarate  (tenofovir disoproxil fumarate) Other (See Comments) 03/05/2011  . Codeine Itching   . Haloperidol lactate Other (See Comments)   . Morphine sulfate Itching   . Navane (thiothixene) Other (See Comments)   . Prochlorperazine edisylate Other (See Comments)   . Promethazine hcl Other (See Comments)   . Propoxyphene-acetaminophen Itching     Review of Systems: See HPI, otherwise negative ROS  Physical Exam: BP 134/88  Pulse 67  Temp 97.4 F (36.3 C)  Wt 204 lb 9.6 oz (92.806 kg) General:   Alert,  Well-developed, pleasant elderly caucasian female.  Alert & cooperative in NAD.  Accompanied by her sister. Eyes:  Sclera clear, no icterus.   Conjunctiva pink. Heart:  Regular rate and rhythm; no murmurs, clicks, rubs,  or gallops. Abdomen:  Normal bowel sounds.  No bruits.  Soft, non-tender and non-distended without masses, hepatosplenomegaly or hernias noted.  No guarding or rebound tenderness.   Rectal:  Large tender thrombosed external hemorrhoid. Msk:  Symmetrical.  Extremities:  No edema. Neurologic:  Alert and oriented x4;  grossly normal neurologically. Skin:  Intact without significant lesions or rashes.

## 2012-09-01 NOTE — Progress Notes (Signed)
Faxed to PCP

## 2012-09-01 NOTE — Patient Instructions (Addendum)
[place]  09/01/2012   Your procedure is scheduled on:  09/02/2012  Report to [facility] at  1200 AM.  Call this number if you have problems the morning of surgery: [phone]   Remember:   Do not eat food:After Midnight.  May have clear liquids:until Midnight .    Take these medicines the morning of surgery with A SIP OF WATER: sentress,epivir,ziagen,bystolic. Take albuterol before you come.   Do not wear jewelry, make-up or nail polish.  Do not wear lotions, powders, or perfumes.   Do not shave 48 hours prior to surgery. Men may shave face and neck.  Do not bring valuables to the hospital.  Contacts, dentures or bridgework may not be worn into surgery.  Leave suitcase in the car. After surgery it may be brought to your room.  For patients admitted to the hospital, checkout time is 11:00 AM the day of discharge.   Patients discharged the day of surgery will not be allowed to drive home.  Name and phone number of your driver: family  Special Instructions: Shower using CHG 2 nights before surgery and the night before surgery.  If you shower the day of surgery use CHG.  Use special wash - you have one bottle of CHG for all showers.  You should use approximately 1/3 of the bottle for each shower.   Please read over the following fact sheets that you were given: Pain Booklet, Coughing and Deep Breathing, MRSA Information, Surgical Site Infection Prevention, Anesthesia Post-op Instructions and Care and Recovery After Surgery Hemorrhoidectomy Hemorrhoidectomy is surgery to remove hemorrhoids. Hemorrhoids are veins that have become swollen in the rectum. The rectum is the area from the bottom end of the intestines to the opening where bowel movements leave the body. Hemorrhoids can be uncomfortable. They can cause itching, bleeding and pain if a blood clot forms in them (thrombose). If hemorrhoids are small, surgery may not be needed. But if they cover a larger area, surgery is  usually suggested.  LET YOUR CAREGIVER KNOW ABOUT:   Any allergies.  All medications you are taking, including:  Herbs, eyedrops, over-the-counter medications and creams.  Blood thinners (anticoagulants), aspirin or other drugs that could affect blood clotting.  Use of steroids (by mouth or as creams).  Previous problems with anesthetics, including local anesthetics.  Possibility of pregnancy, if this applies.  Any history of blood clots.  Any history of bleeding or other blood problems.  Previous surgery.  Smoking history.  Other health problems. RISKS AND COMPLICATIONS All surgery carries some risk. However, hemorrhoid surgery usually goes smoothly. Possible complications could include:  Urinary retention.  Bleeding.  Infection.  A painful incision.  A reaction to the anesthesia (this is not common). BEFORE THE PROCEDURE   Stop using aspirin and non-steroidal anti-inflammatory drugs (NSAIDs) for pain relief. This includes prescription drugs and over-the-counter drugs such as ibuprofen and naproxen. Also stop taking vitamin E. If possible, do this two weeks before your surgery.  If you take blood-thinners, ask your healthcare provider when you should stop taking them.  You will probably have blood and urine tests done several days before your surgery.  Do not eat or drink for about 8 hours before the surgery.  Arrive at least an hour before the surgery, or whenever your surgeon recommends. This will give you time to check in and fill out any needed paperwork.  Hemorrhoidectomy is often an outpatient procedure. This means you will be able  to go home the same day. Sometimes, though, people stay overnight in the hospital after the procedure. Ask your surgeon what to expect. Either way, make arrangements in advance for someone to drive you home. PROCEDURE   The preparation:  You will change into a hospital gown.  You will be given an IV. A needle will be inserted  in your arm. Medication can flow directly into your body through this needle.  You might be given an enema to clear your rectum.  Once in the operating room, you will probably lie on your side or be repositioned later to lying on your stomach.  You will be given anesthesia (medication) so you will not feel anything during the surgery. The surgery often is done with local anesthesia (the area near the hemorrhoids will be numb and you will be drowsy but awake). Sometimes, general anesthesia is used (you will be asleep during the procedure).  The procedure:  There are a few different procedures for hemorrhoids. Be sure to ask you surgeon about the procedure, the risks and benefits.  Be sure to ask about what you need to do to take care of the wound, if there is one. AFTER THE PROCEDURE  You will stay in a recovery area until the anesthesia has worn off. Your blood pressure and pulse will be checked every so often.  You may feel a lot of pain in the area of the rectum.  Take all pain medication prescribed by your surgeon. Ask before taking any over-the-counter pain medicines.  Sometimes sitting in a warm bath can help relieve your pain.  To make sure you have bowel movements without straining:  You will probably need to take stool softeners (usually a pill) for a few days.  You should drink 8 to 10 glasses of water each day.  Your activity will be restricted for awhile. Ask your caregiver for a list of what you should and should not do while you recover. Document Released: 08/09/2009 Document Revised: 01/04/2012 Document Reviewed: 08/09/2009 [facility] Patient Information 2013 [place]. PATIENT INSTRUCTIONS POST-ANESTHESIA  IMMEDIATELY FOLLOWING SURGERY:  Do not drive or operate machinery for the first twenty four hours after surgery.  Do not make any important decisions for twenty four hours after surgery or while taking narcotic pain medications or sedatives.  If you develop  intractable nausea and vomiting or a severe headache please notify your doctor immediately.  FOLLOW-UP:  Please make an appointment with your surgeon as instructed. You do not need to follow up with anesthesia unless specifically instructed to do so.  WOUND CARE INSTRUCTIONS (if applicable):  Keep a dry clean dressing on the anesthesia/puncture wound site if there is drainage.  Once the wound has quit draining you may leave it open to air.  Generally you should leave the bandage intact for twenty four hours unless there is drainage.  If the epidural site drains for more than 36-48 hours please call the anesthesia department.  QUESTIONS?:  Please feel free to call your physician or the hospital operator if you have any questions, and they will be happy to assist you.

## 2012-09-01 NOTE — Progress Notes (Signed)
09/01/12 1235  OBSTRUCTIVE SLEEP APNEA  Have you ever been diagnosed with sleep apnea through a sleep study? No  Do you snore loudly (loud enough to be heard through closed doors)?  1  Do you often feel tired, fatigued, or sleepy during the daytime? 1  Has anyone observed you stop breathing during your sleep? 1  Do you have, or are you being treated for high blood pressure? 1  BMI more than 35 kg/m2? 1  Age over 71 years old? 1  Neck circumference greater than 40 cm/18 inches? 0  Gender: 0  Obstructive Sleep Apnea Score 6   Score 4 or greater  Results sent to PCP;No PCP

## 2012-09-01 NOTE — H&P (Signed)
  NTS SOAP Note  Vital Signs:  Vitals as of: [id number]: Systolic [id number]: Diastolic 99991111: Heart Rate 76: Temp 97.37F: Height 63ft 4in: Weight 202Lbs 0 Ounces: Pain Level 6: BMI 35  BMI : 34.67 kg/m2  Subjective: This 81 Years 60 Months old Female presents for of a thrombosed hemorrhoid.  Has been present for two weeks, no relief with suppositories. Never has had hemorrhoid surgery.  Review of Symptoms:  Constitutional:unremarkable   Head:unremarkable    Eyes:  blurred vision bilateral Nose/Mouth/Throat:unremarkable Cardiovascular:  unremarkable   Respiratory:unremarkable   Gastrointestin    abdominal pain Genitourinary:unremarkable       joint, neck, and back pain Skin:unremarkable     states she bleeds easily   Past Medical History:    Reviewed   Past Medical History  Surgical History: unknown Medical Problems: HIV positive, depression, HTN Allergies: tenovir, codeine, haloperidol, morphine, navane, promethazine, darvocet Medications: albuterol, elavil, colace, epivir, ativan, bystolic, isentress, drisdol, ziagen   Social History:Reviewed  Social History  Preferred Language: English Race:  White Ethnicity: Not Hispanic / Latino Age: 80 Years 6 Months Marital Status:  W Alcohol:  No Recreational drug(s):  No   Smoking Status: Never smoker reviewed on 09/01/2012    Family History:  Reviewed   Family History  Is there a family history [id number], dm, HTN    Objective Information: General:  Well appearing, well nourished in no distress. Heart:  RRR, no murmur Lungs:    CTA bilaterally, no wheezes, rhonchi, rales.  Breathing unlabored.   Thrombosed hemorrhoid noted along right side of anus.  Assessment:Thrombosed hemorrhoid  Diagnosis &amp; Procedure: DiagnosisCode: 455.7, ProcedureCode: [id number],    Plan:Scheduled for hemorrhoidectomy on 09/02/12.   Patient Education:Alternative treatments to surgery  were discussed with patient (and family).  Risks and benefits  of procedure were fully explained to the patient (and family) who gave informed consent. Patient/family questions were addressed.  Follow-up:Pending Surgery

## 2012-09-02 ENCOUNTER — Encounter (HOSPITAL_COMMUNITY): Payer: Self-pay | Admitting: Anesthesiology

## 2012-09-02 ENCOUNTER — Ambulatory Visit (HOSPITAL_COMMUNITY): Payer: Medicare Other | Admitting: Anesthesiology

## 2012-09-02 ENCOUNTER — Encounter (HOSPITAL_COMMUNITY): Payer: Self-pay | Admitting: *Deleted

## 2012-09-02 ENCOUNTER — Ambulatory Visit (HOSPITAL_COMMUNITY)
Admission: RE | Admit: 2012-09-02 | Discharge: 2012-09-02 | Disposition: A | Discharge status: Home / Self Care | Payer: Medicare Other | Source: Ambulatory Visit | Attending: General Surgery | Admitting: General Surgery

## 2012-09-02 ENCOUNTER — Encounter (HOSPITAL_COMMUNITY)
Admission: RE | Disposition: A | Discharge status: Home / Self Care | Payer: Self-pay | Source: Ambulatory Visit | Attending: General Surgery

## 2012-09-02 DIAGNOSIS — Z01812 Encounter for preprocedural laboratory examination: Secondary | ICD-10-CM | POA: Insufficient documentation

## 2012-09-02 DIAGNOSIS — J4489 Other specified chronic obstructive pulmonary disease: Secondary | ICD-10-CM | POA: Insufficient documentation

## 2012-09-02 DIAGNOSIS — I10 Essential (primary) hypertension: Secondary | ICD-10-CM | POA: Insufficient documentation

## 2012-09-02 DIAGNOSIS — K645 Perianal venous thrombosis: Secondary | ICD-10-CM | POA: Insufficient documentation

## 2012-09-02 DIAGNOSIS — J449 Chronic obstructive pulmonary disease, unspecified: Secondary | ICD-10-CM | POA: Insufficient documentation

## 2012-09-02 DIAGNOSIS — Z01810 Encounter for preprocedural cardiovascular examination: Secondary | ICD-10-CM | POA: Insufficient documentation

## 2012-09-02 DIAGNOSIS — B20 Human immunodeficiency virus [HIV] disease: Secondary | ICD-10-CM | POA: Insufficient documentation

## 2012-09-02 HISTORY — PX: HEMORRHOID SURGERY: SHX153

## 2012-09-02 SURGERY — HEMORRHOIDECTOMY
Anesthesia: General | Site: Anus | Wound class: Clean Contaminated

## 2012-09-02 MED ORDER — PROPOFOL 10 MG/ML IV BOLUS
INTRAVENOUS | Status: DC | PRN
  Administered 2012-09-02: 5 mg via INTRAVENOUS

## 2012-09-02 MED ORDER — KETOROLAC TROMETHAMINE 30 MG/ML IJ SOLN
INTRAMUSCULAR | Status: AC
  Filled 2012-09-02: qty 1

## 2012-09-02 MED ORDER — LIDOCAINE HCL (PF) 1 % IJ SOLN
INTRAMUSCULAR | Status: AC
  Filled 2012-09-02: qty 5

## 2012-09-02 MED ORDER — FENTANYL CITRATE 0.05 MG/ML IJ SOLN
25.0000 ug | INTRAMUSCULAR | Status: DC | PRN
  Administered 2012-09-02 (×2): 50 ug via INTRAVENOUS

## 2012-09-02 MED ORDER — SODIUM CHLORIDE 0.9 % IR SOLN
Status: DC | PRN
  Administered 2012-09-02: 1000 mL

## 2012-09-02 MED ORDER — FENTANYL CITRATE 0.05 MG/ML IJ SOLN
INTRAMUSCULAR | Status: DC | PRN
  Administered 2012-09-02 (×3): 50 ug via INTRAVENOUS

## 2012-09-02 MED ORDER — ENOXAPARIN SODIUM 40 MG/0.4ML ~~LOC~~ SOLN
40.0000 mg | Freq: Once | SUBCUTANEOUS | Status: AC
  Administered 2012-09-02: 40 mg via SUBCUTANEOUS

## 2012-09-02 MED ORDER — HEMOSTATIC AGENTS (NO CHARGE) OPTIME
TOPICAL | Status: DC | PRN
  Administered 2012-09-02: 1 via TOPICAL

## 2012-09-02 MED ORDER — SODIUM CHLORIDE 0.9 % IV SOLN
INTRAVENOUS | Status: AC
  Filled 2012-09-02: qty 1

## 2012-09-02 MED ORDER — ONDANSETRON HCL 4 MG/2ML IJ SOLN
4.0000 mg | Freq: Once | INTRAMUSCULAR | Status: AC
  Administered 2012-09-02: 4 mg via INTRAVENOUS

## 2012-09-02 MED ORDER — LACTATED RINGERS IV SOLN
INTRAVENOUS | Status: DC
  Administered 2012-09-02: 1000 mL via INTRAVENOUS

## 2012-09-02 MED ORDER — BUPIVACAINE HCL (PF) 0.5 % IJ SOLN
INTRAMUSCULAR | Status: DC | PRN
  Administered 2012-09-02: 10 mL

## 2012-09-02 MED ORDER — PROPOFOL 10 MG/ML IV EMUL
INTRAVENOUS | Status: AC
  Filled 2012-09-02: qty 20

## 2012-09-02 MED ORDER — ONDANSETRON HCL 4 MG/2ML IJ SOLN
INTRAMUSCULAR | Status: AC
  Filled 2012-09-02: qty 2

## 2012-09-02 MED ORDER — MIDAZOLAM HCL 2 MG/2ML IJ SOLN
1.0000 mg | INTRAMUSCULAR | Status: DC | PRN
  Administered 2012-09-02: 2 mg via INTRAVENOUS

## 2012-09-02 MED ORDER — LIDOCAINE HCL (CARDIAC) 20 MG/ML IV SOLN
INTRAVENOUS | Status: DC | PRN
  Administered 2012-09-02: 50 mg via INTRAVENOUS

## 2012-09-02 MED ORDER — OXYCODONE-ACETAMINOPHEN 5-325 MG PO TABS: 1.0000 | ORAL_TABLET | ORAL | Status: DC | PRN

## 2012-09-02 MED ORDER — LIDOCAINE VISCOUS 2 % MT SOLN
OROMUCOSAL | Status: AC
  Filled 2012-09-02: qty 15

## 2012-09-02 MED ORDER — FENTANYL CITRATE 0.05 MG/ML IJ SOLN
INTRAMUSCULAR | Status: AC
  Filled 2012-09-02: qty 5

## 2012-09-02 MED ORDER — ROCURONIUM BROMIDE 100 MG/10ML IV SOLN
INTRAVENOUS | Status: DC | PRN
  Administered 2012-09-02: 5 mg via INTRAVENOUS

## 2012-09-02 MED ORDER — KETOROLAC TROMETHAMINE 30 MG/ML IJ SOLN
30.0000 mg | Freq: Once | INTRAMUSCULAR | Status: AC
  Administered 2012-09-02: 30 mg via INTRAVENOUS

## 2012-09-02 MED ORDER — ROCURONIUM BROMIDE 50 MG/5ML IV SOLN
INTRAVENOUS | Status: AC
  Filled 2012-09-02: qty 1

## 2012-09-02 MED ORDER — SODIUM CHLORIDE 0.9 % IV SOLN
1.0000 g | INTRAVENOUS | Status: AC
  Administered 2012-09-02: 1 g via INTRAVENOUS

## 2012-09-02 MED ORDER — LIDOCAINE VISCOUS 2 % MT SOLN
OROMUCOSAL | Status: DC | PRN
  Administered 2012-09-02: 15 mL

## 2012-09-02 MED ORDER — ENOXAPARIN SODIUM 40 MG/0.4ML ~~LOC~~ SOLN
SUBCUTANEOUS | Status: AC
  Filled 2012-09-02: qty 0.4

## 2012-09-02 MED ORDER — FENTANYL CITRATE 0.05 MG/ML IJ SOLN
INTRAMUSCULAR | Status: AC
  Filled 2012-09-02: qty 2

## 2012-09-02 MED ORDER — ONDANSETRON HCL 4 MG/2ML IJ SOLN: 4.0000 mg | Freq: Once | INTRAMUSCULAR | Status: DC | PRN

## 2012-09-02 MED ORDER — MIDAZOLAM HCL 2 MG/2ML IJ SOLN
INTRAMUSCULAR | Status: AC
  Filled 2012-09-02: qty 2

## 2012-09-02 MED ORDER — BUPIVACAINE HCL (PF) 0.5 % IJ SOLN
INTRAMUSCULAR | Status: AC
  Filled 2012-09-02: qty 30

## 2012-09-02 SURGICAL SUPPLY — 31 items
BAG HAMPER (MISCELLANEOUS) ×2 IMPLANT
CLOTH BEACON ORANGE TIMEOUT ST (SAFETY) ×2 IMPLANT
COVER LIGHT HANDLE STERIS (MISCELLANEOUS) ×4 IMPLANT
DECANTER SPIKE VIAL GLASS SM (MISCELLANEOUS) ×2 IMPLANT
DRAPE PROXIMA HALF (DRAPES) ×2 IMPLANT
ELECT REM PT RETURN 9FT ADLT (ELECTROSURGICAL) ×2
ELECTRODE REM PT RTRN 9FT ADLT (ELECTROSURGICAL) ×1 IMPLANT
FORMALIN 10 PREFIL 120ML (MISCELLANEOUS) ×2 IMPLANT
GLOVE BIO SURGEON STRL SZ7.5 (GLOVE) ×2 IMPLANT
GLOVE BIOGEL PI IND STRL 7.0 (GLOVE) ×1 IMPLANT
GLOVE BIOGEL PI IND STRL 7.5 (GLOVE) ×1 IMPLANT
GLOVE BIOGEL PI INDICATOR 7.0 (GLOVE) ×1
GLOVE BIOGEL PI INDICATOR 7.5 (GLOVE) ×1
GLOVE EXAM NITRILE MD LF STRL (GLOVE) ×2 IMPLANT
GLOVE SS BIOGEL STRL SZ 6.5 (GLOVE) ×1 IMPLANT
GLOVE SUPERSENSE BIOGEL SZ 6.5 (GLOVE) ×1
GOWN STRL REIN XL XLG (GOWN DISPOSABLE) ×6 IMPLANT
HEMOSTAT SURGICEL 4X8 (HEMOSTASIS) ×2 IMPLANT
KIT ROOM TURNOVER AP CYSTO (KITS) ×2 IMPLANT
LIGASURE IMPACT 36 18CM CVD LR (INSTRUMENTS) IMPLANT
MANIFOLD NEPTUNE II (INSTRUMENTS) ×2 IMPLANT
NEEDLE HYPO 25X1 1.5 SAFETY (NEEDLE) ×2 IMPLANT
NS IRRIG 1000ML POUR BTL (IV SOLUTION) ×2 IMPLANT
PACK PERI GYN (CUSTOM PROCEDURE TRAY) ×2 IMPLANT
PAD ARMBOARD 7.5X6 YLW CONV (MISCELLANEOUS) ×2 IMPLANT
SET BASIN LINEN APH (SET/KITS/TRAYS/PACK) ×2 IMPLANT
SHEARS HARMONIC 9CM CVD (BLADE) IMPLANT
SPONGE GAUZE 4X4 12PLY (GAUZE/BANDAGES/DRESSINGS) ×2 IMPLANT
SUT SILK 0 FSL (SUTURE) ×2 IMPLANT
SUT VIC AB 2-0 CT2 27 (SUTURE) IMPLANT
SYR CONTROL 10ML LL (SYRINGE) ×2 IMPLANT

## 2012-09-02 NOTE — Transfer of Care (Signed)
Immediate Anesthesia Transfer of Care Note  Patient: [name]  Procedure(s) Performed: Procedure(s) (LRB) with comments: HEMORRHOIDECTOMY (N/A)  Patient Location: PACU  Anesthesia Type:General  Level of Consciousness: awake, alert  and oriented  Airway & Oxygen Therapy: Patient Spontanous Breathing and Patient connected to face mask oxygen  Post-op Assessment: Report given to PACU RN  Post vital signs: Reviewed and stable  Complications: No apparent anesthesia complications

## 2012-09-02 NOTE — Anesthesia Postprocedure Evaluation (Signed)
  Anesthesia Post-op Note  Patient: [name]  Procedure(s) Performed: Procedure(s) (LRB) with comments: HEMORRHOIDECTOMY (N/A)  Patient Location: PACU  Anesthesia Type:General  Level of Consciousness: awake, alert  and oriented  Airway and Oxygen Therapy: Patient Spontanous Breathing and Patient connected to face mask oxygen  Post-op Pain: none  Post-op Assessment: Post-op Vital signs reviewed, Patient's Cardiovascular Status Stable, Respiratory Function Stable, Patent Airway and No signs of Nausea or vomiting  Post-op Vital Signs: Reviewed and stable  Complications: No apparent anesthesia complications

## 2012-09-02 NOTE — Interval H&P Note (Signed)
History and Physical Interval Note:  09/02/2012 1:27 PM  [name]  has presented today for surgery, with the diagnosis of hemorrhoids  The various methods of treatment have been discussed with the patient and family. After consideration of risks, benefits and other options for treatment, the patient has consented to  Procedure(s) (LRB) with comments: HEMORRHOIDECTOMY (N/A) as a surgical intervention .  The patient's history has been reviewed, patient examined, no change in status, stable for surgery.  I have reviewed the patient's chart and labs.  Questions were answered to the patient's satisfaction.     Aviva Signs A

## 2012-09-02 NOTE — Progress Notes (Signed)
Rectal packing d/c'd as ordered.

## 2012-09-02 NOTE — Anesthesia Preprocedure Evaluation (Signed)
Anesthesia Evaluation  Patient identified by MRN, date of birth, ID band Patient awake    Reviewed: Allergy & Precautions, H&P , NPO status , Patient's Chart, lab work & pertinent test results  Airway Mallampati: II      Dental  (+) Edentulous Upper and Edentulous Lower   Pulmonary COPD breath sounds clear to auscultation        Cardiovascular hypertension, Pt. on medications Rhythm:Regular     Neuro/Psych PSYCHIATRIC DISORDERS Anxiety Depression TIA Neuromuscular disease    GI/Hepatic GERD-  ,  Endo/Other    Renal/GU Renal InsufficiencyRenal disease     Musculoskeletal   Abdominal   Peds  Hematology   Anesthesia Other Findings HIV  Reproductive/Obstetrics                           Anesthesia Physical Anesthesia Plan  ASA: III  Anesthesia Plan: General   Post-op Pain Management:    Induction: Intravenous, Rapid sequence and Cricoid pressure planned  Airway Management Planned: Oral ETT  Additional Equipment:   Intra-op Plan:   Post-operative Plan: Extubation in OR  Informed Consent: I have reviewed the patients History and Physical, chart, labs and discussed the procedure including the risks, benefits and alternatives for the proposed anesthesia with the patient or authorized representative who has indicated his/her understanding and acceptance.     Plan Discussed with:   Anesthesia Plan Comments:         Anesthesia Quick Evaluation

## 2012-09-02 NOTE — Anesthesia Procedure Notes (Addendum)
Procedure Name: Intubation Date/Time: 09/02/2012 2:11 PM Performed by: Tressie Stalker E Pre-anesthesia Checklist: Patient identified, Patient being monitored, Timeout performed, Emergency Drugs available and Suction available Patient Re-evaluated:Patient Re-evaluated prior to inductionOxygen Delivery Method: Circle System Utilized Preoxygenation: Pre-oxygenation with 100% oxygen Intubation Type: IV induction, Rapid sequence and Cricoid Pressure applied Ventilation: Mask ventilation without difficulty Laryngoscope Size: Mac and 3 Grade View: Grade I Tube type: Oral Tube size: 7.0 mm Number of attempts: 1 Airway Equipment and Method: stylet Placement Confirmation: ETT inserted through vocal cords under direct vision,  positive ETCO2 and breath sounds checked- equal and bilateral Secured at: 21 cm Tube secured with: Tape Dental Injury: Teeth and Oropharynx as per pre-operative assessment

## 2012-09-02 NOTE — Op Note (Signed)
Patient:  [name]  DOB:  01/02/1941  MRN:  [id number]   Preop Diagnosis:  Thrombosed hemorrhoid  Postop Diagnosis:  Same  Procedure:  Hemorrhoidectomy  Surgeon:  Aviva Signs, M.D.  Anes:  General endotracheal  Indications:  Patient is a 71 year old white female presents with a two-week history of worsening rectal pain. She has a thrombosed internal and external hemorrhoid. The risks and benefits of the procedure including bleeding, infection, and recurrence of hemorrhoidal disease were fully explained to the patient, gave informed consent.  Procedure note:  The patient was placed in the lithotomy position after induction of general endotracheal anesthesia. The perineum was prepped and draped using usual sterile technique with betadine. Surgical site confirmation was performed.  The patient was noted have a large internal and external hemorrhoid at the 8:00 position. Good sphincter tone was noted. No other mass lesions were noted. The thrombosed hemorrhoid was excised using the LigaSure and sent to pathology for further examination. No abnormal bleeding was noted the end of the procedure. 0.5% Sensorcaine was instilled the surrounding peritoneum. Surgicel and Viscous Xylocaine rectal packing was then placed.  All tape and needle counts were correct the end of the procedure. Patient was extubated in the operating room and transferred to PACU in stable condition.  Complications:  None  EBL:  Minimal  Specimen:  Hemorrhoids

## 2012-09-06 ENCOUNTER — Encounter (HOSPITAL_COMMUNITY): Payer: Self-pay | Admitting: General Surgery

## 2012-09-08 NOTE — Progress Notes (Signed)
REVIEWED.  

## 2012-09-14 ENCOUNTER — Other Ambulatory Visit: Payer: Medicare Other

## 2012-09-28 ENCOUNTER — Ambulatory Visit: Payer: Medicare Other | Admitting: Infectious Disease

## 2012-09-28 ENCOUNTER — Telehealth: Payer: Self-pay | Admitting: *Deleted

## 2012-09-28 NOTE — Telephone Encounter (Signed)
Left a message on a generic voicemail box asking patient to please reschedule her lab and follow up appointments with her doctor's office as soon as possible. Landis Gandy

## 2012-10-31 ENCOUNTER — Other Ambulatory Visit: Payer: Self-pay | Admitting: Infectious Disease

## 2012-10-31 DIAGNOSIS — B20 Human immunodeficiency virus [HIV] disease: Secondary | ICD-10-CM

## 2012-10-31 MED ORDER — DOLUTEGRAVIR SODIUM 50 MG PO TABS: 1.0000 | ORAL_TABLET | Freq: Every day | ORAL | Status: DC

## 2012-11-15 ENCOUNTER — Other Ambulatory Visit (INDEPENDENT_AMBULATORY_CARE_PROVIDER_SITE_OTHER): Payer: Medicare Other

## 2012-11-15 DIAGNOSIS — B20 Human immunodeficiency virus [HIV] disease: Secondary | ICD-10-CM

## 2012-11-15 LAB — CBC WITH DIFFERENTIAL/PLATELET
Hemoglobin: 14.3 g/dL (ref 12.0–15.0)
Lymphs Abs: 2.6 10*3/uL (ref 0.7–4.0)
Monocytes Relative: 10 % (ref 3–12)
Neutro Abs: 3.5 10*3/uL (ref 1.7–7.7)
Neutrophils Relative %: 51 % (ref 43–77)
RBC: 4.3 MIL/uL (ref 3.87–5.11)
WBC: 6.9 10*3/uL (ref 4.0–10.5)

## 2012-11-16 LAB — COMPLETE METABOLIC PANEL WITH GFR
ALT: 21 U/L (ref 0–35)
BUN: 29 mg/dL — ABNORMAL HIGH (ref 6–23)
CO2: 26 mEq/L (ref 19–32)
Creat: 1.49 mg/dL — ABNORMAL HIGH (ref 0.50–1.10)
GFR, Est African American: 40 mL/min — ABNORMAL LOW
GFR, Est Non African American: 35 mL/min — ABNORMAL LOW
Glucose, Bld: 73 mg/dL (ref 70–99)
Total Bilirubin: 0.6 mg/dL (ref 0.3–1.2)

## 2012-11-16 LAB — HIV-1 RNA QUANT-NO REFLEX-BLD: HIV-1 RNA Quant, Log: <1.3 {Log} (ref <1.30)

## 2012-11-16 LAB — T-HELPER CELL (CD4) - (RCID CLINIC ONLY)
CD4 % Helper T Cell: 30 % — ABNORMAL LOW (ref 33–55)
CD4 T Cell Abs: 710 uL (ref 400–2700)

## 2012-12-01 ENCOUNTER — Other Ambulatory Visit: Payer: Self-pay | Admitting: Licensed Clinical Social Worker

## 2012-12-01 ENCOUNTER — Other Ambulatory Visit: Payer: Self-pay | Admitting: Infectious Disease

## 2012-12-01 DIAGNOSIS — B20 Human immunodeficiency virus [HIV] disease: Secondary | ICD-10-CM

## 2012-12-01 MED ORDER — LAMIVUDINE 150 MG PO TABS: 150.0000 mg | ORAL_TABLET | Freq: Every day | ORAL | Status: DC

## 2012-12-07 ENCOUNTER — Ambulatory Visit: Payer: Medicare Other | Admitting: Infectious Disease

## 2012-12-08 ENCOUNTER — Ambulatory Visit: Payer: Medicare Other | Admitting: Infectious Disease

## 2012-12-14 ENCOUNTER — Encounter: Payer: Self-pay | Admitting: Infectious Disease

## 2012-12-14 ENCOUNTER — Ambulatory Visit (INDEPENDENT_AMBULATORY_CARE_PROVIDER_SITE_OTHER): Payer: Medicare Other | Admitting: Infectious Disease

## 2012-12-14 VITALS — BP 146/76 | HR 76 | Temp 98.1°F | Wt 202.0 lb

## 2012-12-14 DIAGNOSIS — T452X1A Poisoning by vitamins, accidental (unintentional), initial encounter: Secondary | ICD-10-CM | POA: Insufficient documentation

## 2012-12-14 DIAGNOSIS — B20 Human immunodeficiency virus [HIV] disease: Secondary | ICD-10-CM

## 2012-12-14 DIAGNOSIS — E785 Hyperlipidemia, unspecified: Secondary | ICD-10-CM

## 2012-12-14 DIAGNOSIS — R5383 Other fatigue: Secondary | ICD-10-CM

## 2012-12-14 DIAGNOSIS — R5381 Other malaise: Secondary | ICD-10-CM

## 2012-12-14 DIAGNOSIS — E559 Vitamin D deficiency, unspecified: Secondary | ICD-10-CM | POA: Insufficient documentation

## 2012-12-14 LAB — LDL CHOLESTEROL, DIRECT: Direct LDL: 190 mg/dL — ABNORMAL HIGH

## 2012-12-14 MED ORDER — ABACAVIR SULFATE 300 MG PO TABS: 600.0000 mg | ORAL_TABLET | Freq: Every day | ORAL | Status: DC

## 2012-12-14 NOTE — Progress Notes (Signed)
Subjective:    Patient ID: [name], female    DOB: April 11, 1941, 72 y.o.   MRN: [id number]  HPI  72 year old Caucasian female with HIV currently well controlled although she did not seem initially to understand what her antiretroviral regimen is. After thorough review including searching the Internet 4 images of her pill bottles it appears that she is indeed taking the prescribed regimen of 2 tablets of Ziagen daily along with one of the milligrams of Epivir daily and 50 mg of Tivicay daily. Her viral load was undetectable in January but will check again today to make sure. I've asked her to to bring all bottles with her.  She continues to complain of fatigue and I wonder if this may indeed be due to depression. We did check her thyroid function tests including Zosyn hormone free T4 and T3 in his overall normal. She's been treated for vitamin D deficiency with 50,000 units of vitamin D twice daily which appears to be too high of a dose at and what I've read. I've encouraged her to followup with her primary care doctor regarding this dose. I spent greater than 45 minutes with the patient including greater than 50% of time in face to face counsel of the patient and in coordination of their care.   Review of Systems  Constitutional: Positive for activity change and fatigue. Negative for fever, chills, diaphoresis, appetite change and unexpected weight change.  HENT: Negative for congestion, sore throat, rhinorrhea, sneezing, trouble swallowing and sinus pressure.   Eyes: Negative for photophobia and visual disturbance.  Respiratory: Negative for cough, chest tightness, shortness of breath, wheezing and stridor.   Cardiovascular: Negative for chest pain, palpitations and leg swelling.  Gastrointestinal: Negative for nausea, vomiting, abdominal pain, diarrhea, constipation, blood in stool, abdominal distention and anal bleeding.  Genitourinary: Negative for dysuria, hematuria, flank pain and  difficulty urinating.  Musculoskeletal: Negative for myalgias, back pain, joint swelling, arthralgias and gait problem.  Skin: Negative for color change, pallor, rash and wound.  Neurological: Negative for dizziness, tremors, weakness and light-headedness.  Hematological: Negative for adenopathy. Does not bruise/bleed easily.  Psychiatric/Behavioral: Positive for decreased concentration. Negative for behavioral problems, confusion, sleep disturbance, dysphoric mood and agitation.       Objective:   Physical Exam  Constitutional: She is oriented to person, place, and time. She appears well-developed and well-nourished. No distress.  HENT:  Head: Normocephalic and atraumatic.  Mouth/Throat: Oropharynx is clear and moist. No oropharyngeal exudate.  Eyes: Conjunctivae and EOM are normal. Pupils are equal, round, and reactive to light. No scleral icterus.  Neck: Normal range of motion. Neck supple. No JVD present.  Cardiovascular: Normal rate, regular rhythm and normal heart sounds.  Exam reveals no gallop and no friction rub.   No murmur heard. Pulmonary/Chest: Effort normal and breath sounds normal. No respiratory distress. She has no wheezes. She has no rales. She exhibits no tenderness.  Abdominal: She exhibits no distension and no mass. There is no tenderness. There is no rebound and no guarding.  Musculoskeletal: She exhibits no edema and no tenderness.  Lymphadenopathy:    She has no cervical adenopathy.  Neurological: She is alert and oriented to person, place, and time. She exhibits normal muscle tone. Coordination normal.  Skin: Skin is warm and dry. She is not diaphoretic. No erythema. No pallor.  Psychiatric: She has a normal mood and affect. Her behavior is normal. Judgment and thought content normal.  Assessment & Plan:  HIV: Continue Ziagen 600 mg Epivir 150 mg and spell TIVICAY 50mg  daily, check VL today and bring back in a few months for recheck of labs  Low  Vitamin D: encouraged her to drop to simply 800U per day and fu with PCP  CKD: relatively stable  Hemorrhoid: sp surgery by [facility]  Elevated TG; states she is no longer taking lovaza

## 2012-12-14 NOTE — Patient Instructions (Addendum)
I would recommend stopping the high dose vitamin d and changing to vitamin d OTC medicine 800 units daily  YOUR REGIMEN FROM [place] IS:  TIVICAY (YELLOW CIRCULAR TABLET) ONCE DAILY  EPIVIR 150MG  WHITE TABLET DAILY  EPZICOM 300MG  TWO YELLOW TABLETS DAILY

## 2012-12-15 LAB — TSH: TSH: 2.222 u[IU]/mL (ref 0.350–4.500)

## 2013-01-30 ENCOUNTER — Other Ambulatory Visit: Payer: Self-pay | Admitting: Infectious Disease

## 2013-03-14 ENCOUNTER — Ambulatory Visit: Payer: Medicare Other | Admitting: Infectious Disease

## 2013-04-17 ENCOUNTER — Emergency Department (HOSPITAL_COMMUNITY)
Admission: EM | Admit: 2013-04-17 | Discharge: 2013-04-17 | Disposition: A | Discharge status: Home / Self Care | Payer: Medicare Other | Attending: Emergency Medicine | Admitting: Emergency Medicine

## 2013-04-17 ENCOUNTER — Emergency Department (HOSPITAL_COMMUNITY): Payer: Medicare Other

## 2013-04-17 ENCOUNTER — Encounter (HOSPITAL_COMMUNITY): Payer: Self-pay

## 2013-04-17 DIAGNOSIS — Z8673 Personal history of transient ischemic attack (TIA), and cerebral infarction without residual deficits: Secondary | ICD-10-CM | POA: Insufficient documentation

## 2013-04-17 DIAGNOSIS — R109 Unspecified abdominal pain: Secondary | ICD-10-CM | POA: Insufficient documentation

## 2013-04-17 DIAGNOSIS — J449 Chronic obstructive pulmonary disease, unspecified: Secondary | ICD-10-CM | POA: Insufficient documentation

## 2013-04-17 DIAGNOSIS — Z9889 Other specified postprocedural states: Secondary | ICD-10-CM | POA: Insufficient documentation

## 2013-04-17 DIAGNOSIS — F411 Generalized anxiety disorder: Secondary | ICD-10-CM | POA: Insufficient documentation

## 2013-04-17 DIAGNOSIS — F329 Major depressive disorder, single episode, unspecified: Secondary | ICD-10-CM | POA: Insufficient documentation

## 2013-04-17 DIAGNOSIS — K5900 Constipation, unspecified: Secondary | ICD-10-CM | POA: Insufficient documentation

## 2013-04-17 DIAGNOSIS — Z90710 Acquired absence of both cervix and uterus: Secondary | ICD-10-CM | POA: Insufficient documentation

## 2013-04-17 DIAGNOSIS — M129 Arthropathy, unspecified: Secondary | ICD-10-CM | POA: Insufficient documentation

## 2013-04-17 DIAGNOSIS — Z8709 Personal history of other diseases of the respiratory system: Secondary | ICD-10-CM | POA: Insufficient documentation

## 2013-04-17 DIAGNOSIS — F3289 Other specified depressive episodes: Secondary | ICD-10-CM | POA: Insufficient documentation

## 2013-04-17 DIAGNOSIS — J4489 Other specified chronic obstructive pulmonary disease: Secondary | ICD-10-CM | POA: Insufficient documentation

## 2013-04-17 DIAGNOSIS — I10 Essential (primary) hypertension: Secondary | ICD-10-CM | POA: Insufficient documentation

## 2013-04-17 DIAGNOSIS — Z9851 Tubal ligation status: Secondary | ICD-10-CM | POA: Insufficient documentation

## 2013-04-17 DIAGNOSIS — Z21 Asymptomatic human immunodeficiency virus [HIV] infection status: Secondary | ICD-10-CM | POA: Insufficient documentation

## 2013-04-17 DIAGNOSIS — Z8669 Personal history of other diseases of the nervous system and sense organs: Secondary | ICD-10-CM | POA: Insufficient documentation

## 2013-04-17 DIAGNOSIS — Z79899 Other long term (current) drug therapy: Secondary | ICD-10-CM | POA: Insufficient documentation

## 2013-04-17 MED ORDER — PEG 3350-KCL-NABCB-NACL-NASULF 236 G PO SOLR: 4.0000 L | Freq: Once | ORAL | Status: DC

## 2013-04-17 NOTE — ED Notes (Signed)
Pt reports last "good bowel movement was 2 weeks ago", has eaten [place] and raisins, with no results. Took brown pills last night (thinks was a laxative) having ab cramping.

## 2013-04-17 NOTE — ED Provider Notes (Signed)
History     This chart was scribed for Tanya Relic, MD, MD by Rhae Lerner, ED Scribe. The patient was seen in room APA15/[facility]15 and the patient's care was started at 8:43 AM.   CSN: [id number]  Arrival date & time 04/17/13  0831    Chief Complaint  Patient presents with  . Constipation    The history is provided by the patient and medical records. No language interpreter was used.   HPI Comments: [name] is a 72 y.o. female with hx of HIV, HTN, COPD and TIAs who presents to the Emergency Department complaining of constant, moderate constipation onset 2 weeks ago. She reports that at baseline she has chronic constipation. She reports that she normally has BM every couple of days. She reports that she her stool is small (rabbit pellets) and hard. She mentions having intermittent abdominal pain with urge for defecation. Pt reports that she has eaten ativia yogurt and raisins without relief. Pt denies fever, chills, nausea, vomiting, diarrhea, weakness, cough, SOB and any other pain. She reports that her HIV is in remission.    Past Medical History  Diagnosis Date  . Bronchitis   . HIV (human immunodeficiency virus infection)   . Hypertension   . History of TIAs 1981    left side weakness  . COPD (chronic obstructive pulmonary disease)   . Kidney disease related to HIV infection     pt states she has to have her creatinine level checked often   . Depression   . Anxiety   . Arthritis   . Peripheral neuropathy     bilateral feet    Past Surgical History  Procedure Laterality Date  . Abdominal hysterectomy  1985  . Back surgery    . Tubal ligation    . Esophagogastroduodenoscopy  08/10/2002      NUR: Mild changes of reflux esophagitis limited to gastroesophageal junction  No evidence of ring, stricture, or esophageal candidiasis dysphagia/ Gastritis, possibly H. pylori induced  . Colonoscopy  08/10/2002    NUR: Normal colonoscopy except for some hemorrhoids and some  erythema at the dentate line  . Esophagogastroduodenoscopy  12/25/2002    NUR: Erosive antral gastritis.  The degree of gastritis is more significant than her last exam/ Normal examination of the esophagus/ Esophageal dilatation performed by passing 83 and 27 [place]  . Colonoscopy  12/10/2006    Edwards:Diffuse colitis in the transverse and descending colon/This is not entirely typical of ischemic colitis or her HIV positive/ disease.  We need to be concerned about other causes  . Esophagogastroduodenoscopy  11/13/2010    RMR: Circumferential distal esophageal erosions with soft peptic stricture, consistent with erosive reflux esophagitis or stricture  formation, status post dilation as described above/  Small hiatal hernia/ Tiny antral erosions, otherwise normal stomach D1 and D2  . Colonoscopy  01/19/2011    RMR: Internal and external hemorrhoids, likely source of hematochezia anal papilla, otherwise normal rectal mucosa/ Left-sided diverticula.  Cecal polyp with status post cold snare polypectomy.  Remainder of colonic mucosa appeared normal  . Cataract extraction, bilateral    . Hemorrhoid surgery  09/02/2012    Procedure: HEMORRHOIDECTOMY;  Surgeon: Jamesetta So, MD;  Location: [facility];  Service: General;  Laterality: N/A;    No family history on file.  History  Substance Use Topics  . Smoking status: Never Smoker   . Smokeless tobacco: Never Used  . Alcohol Use: No    OB History  Grav Para Term Preterm Abortions TAB SAB Ect Mult Living                  Review of Systems A complete 10 system review of systems was obtained and all systems are negative except as noted in the HPI and PMH.   Allergies  Bee venom; Tenofovir disoproxil fumarate; Codeine; Haloperidol lactate; Morphine sulfate; Navane; Prochlorperazine edisylate; Promethazine hcl; and Propoxyphene-acetaminophen  Home Medications   Current Outpatient Rx  Name  Route  Sig  Dispense  Refill  . abacavir  (ZIAGEN) 300 MG tablet   Oral   Take 2 tablets (600 mg total) by mouth daily.   60 tablet   11   . acidophilus (RISAQUAD) CAPS   Oral   Take 1 capsule by mouth daily.         Marland Kitchen albuterol (PROVENTIL HFA;VENTOLIN HFA) 108 (90 BASE) MCG/ACT inhaler   Inhalation   Inhale 2 puffs into the lungs every 6 (six) hours as needed. For shortness of breath         . amitriptyline (ELAVIL) 25 MG tablet   Oral   Take 25 mg by mouth at bedtime.          . cholecalciferol (VITAMIN D) 400 UNITS TABS   Oral   Take 400 Units by mouth daily.         . cycloSPORINE (RESTASIS) 0.05 % ophthalmic emulsion   Both Eyes   Place 1 drop into both eyes 2 (two) times daily.         Marland Kitchen docusate sodium (COLACE) 100 MG capsule   Oral   Take 100 mg by mouth daily as needed. For constipation         . dolutegravir (TIVICAY) 50 MG tablet   Oral   Take 50 mg by mouth daily.         Marland Kitchen lamiVUDine (EPIVIR) 150 MG tablet   Oral   Take 1 tablet (150 mg total) by mouth daily.   30 tablet   3   . LORazepam (ATIVAN) 2 MG tablet   Oral   Take 2 mg by mouth 2 (two) times daily.           . nebivolol (BYSTOLIC) 5 MG tablet   Oral   Take 5 mg by mouth daily.           . nitrofurantoin, macrocrystal-monohydrate, (MACROBID) 100 MG capsule   Oral   Take 100 mg by mouth Daily.          Marland Kitchen oxyCODONE-acetaminophen (PERCOCET/ROXICET) 5-325 MG per tablet   Oral   Take 1 tablet by mouth every 4 (four) hours as needed. For pain   30 tablet   0`   . thiamine 100 MG tablet   Oral   Take 100 mg by mouth daily.         . vitamin E 400 UNIT capsule   Oral   Take 400 Units by mouth daily.           . polyethylene glycol (GOLYTELY) 236 G solution   Oral   Take 4,000 mLs by mouth once. Drink over 4-6 hours or until rectal output is clear.   4000 mL   0     BP 161/86  Pulse 71  Temp(Src) 98.9 F (37.2 C) (Oral)  Resp 18  Ht 5\' 2"  (1.575 m)  Wt 200 lb (90.719 kg)  BMI 36.57 kg/m2   SpO2 94%  Physical Exam  Nursing  note and vitals reviewed. Constitutional:  Awake, alert, nontoxic appearance.  HENT:  Head: Atraumatic.  Eyes: Right eye exhibits no discharge. Left eye exhibits no discharge.  Neck: Neck supple.  Pulmonary/Chest: Effort normal. She exhibits no tenderness.  Abdominal: Soft. There is no tenderness. There is no rebound.  Genitourinary:  Chaperone present for rectal exam Small amount of light brown stool without palpable impaction.    Musculoskeletal: She exhibits no tenderness.  Baseline ROM, no obvious new focal weakness.  Neurological:  Mental status and motor strength appears baseline for patient and situation.  Skin: No rash noted.  Psychiatric: She has a normal mood and affect.    ED Course  Procedures (including critical care time) DIAGNOSTIC STUDIES: Oxygen Saturation is 94% on room air, adequate by my interpretation.    COORDINATION OF CARE: 8:50 AM Patient / Family / Caregiver understand and agree with initial ED impression and plan with expectations set for ED visit.   9:44 AM Patient / Family / Caregiver informed of clinical course, understand medical decision-making process, and agree with plan.      Labs Reviewed - No data to display Dg Abd Acute W/chest  04/17/2013   *RADIOLOGY REPORT*  Clinical Data: Intermittent abdominal pain, nausea and constipation.  ACUTE ABDOMEN SERIES (ABDOMEN 2 VIEW & CHEST 1 VIEW)  Comparison: CT 08/27/2012  Findings: There is a large amount of fecal matter throughout the colon.  Small bowel gas pattern is normal.  There are surgical clips in the right upper quadrant consistent with previous cholecystectomy.  Ordinary degenerative changes and postoperative changes noted in the spine.  One-view chest shows normal heart size and mediastinal shadows. Lungs are clear.  No free air under the diaphragms.  IMPRESSION: Large amount of fecal matter throughout the colon consistent with the clinical history.  No sign  of small bowel pathology.  No free air or other acute finding.   Original Report Authenticated By: Nelson Chimes, M.D.     1. Abdominal pain   2. Constipation       MDM  The patient appears reasonably screened and/or stabilized for discharge and I doubt any other medical condition or other [facility] requiring further screening, evaluation, or treatment in the ED at this time prior to discharge. I doubt any other EMC precluding discharge at this time including, but not necessarily limited to the following: bowel obstruction        I personally performed the services described in this documentation, which was scribed in my presence. The recorded information has been reviewed and is accurate.    Tanya Relic, MD 04/17/13 2218

## 2013-04-17 NOTE — ED Notes (Signed)
Instructions, prescriptions and f/u information provided and reviewed - verbalizes understanding. Left in c/o family for transport home.

## 2013-05-24 ENCOUNTER — Ambulatory Visit (INDEPENDENT_AMBULATORY_CARE_PROVIDER_SITE_OTHER): Payer: Medicare Other | Admitting: Infectious Disease

## 2013-05-24 ENCOUNTER — Encounter: Payer: Self-pay | Admitting: Infectious Disease

## 2013-05-24 VITALS — BP 144/81 | HR 72 | Temp 98.3°F | Wt 207.0 lb

## 2013-05-24 DIAGNOSIS — N184 Chronic kidney disease, stage 4 (severe): Secondary | ICD-10-CM

## 2013-05-24 DIAGNOSIS — M8580 Other specified disorders of bone density and structure, unspecified site: Secondary | ICD-10-CM

## 2013-05-24 DIAGNOSIS — B20 Human immunodeficiency virus [HIV] disease: Secondary | ICD-10-CM

## 2013-05-24 DIAGNOSIS — M542 Cervicalgia: Secondary | ICD-10-CM

## 2013-05-24 DIAGNOSIS — Z113 Encounter for screening for infections with a predominantly sexual mode of transmission: Secondary | ICD-10-CM

## 2013-05-24 DIAGNOSIS — L853 Xerosis cutis: Secondary | ICD-10-CM

## 2013-05-24 DIAGNOSIS — E538 Deficiency of other specified B group vitamins: Secondary | ICD-10-CM

## 2013-05-24 DIAGNOSIS — M899 Disorder of bone, unspecified: Secondary | ICD-10-CM

## 2013-05-24 DIAGNOSIS — L738 Other specified follicular disorders: Secondary | ICD-10-CM

## 2013-05-24 DIAGNOSIS — F329 Major depressive disorder, single episode, unspecified: Secondary | ICD-10-CM

## 2013-05-24 DIAGNOSIS — E785 Hyperlipidemia, unspecified: Secondary | ICD-10-CM

## 2013-05-24 LAB — CBC WITH DIFFERENTIAL/PLATELET
Basophils Absolute: 0 10*3/uL (ref 0.0–0.1)
Lymphocytes Relative: 44 % (ref 12–46)
Lymphs Abs: 2.9 10*3/uL (ref 0.7–4.0)
Neutrophils Relative %: 45 % (ref 43–77)
Platelets: 194 10*3/uL (ref 150–400)
RBC: 4.17 MIL/uL (ref 3.87–5.11)
RDW: 14.2 % (ref 11.5–15.5)
WBC: 6.4 10*3/uL (ref 4.0–10.5)

## 2013-05-24 LAB — COMPLETE METABOLIC PANEL WITH GFR
Albumin: 4.5 g/dL (ref 3.5–5.2)
BUN: 31 mg/dL — ABNORMAL HIGH (ref 6–23)
CO2: 29 mEq/L (ref 19–32)
Calcium: 10.5 mg/dL (ref 8.4–10.5)
Chloride: 106 mEq/L (ref 96–112)
Creat: 1.65 mg/dL — ABNORMAL HIGH (ref 0.50–1.10)
GFR, Est African American: 36 mL/min — ABNORMAL LOW
GFR, Est Non African American: 31 mL/min — ABNORMAL LOW
Glucose, Bld: 117 mg/dL — ABNORMAL HIGH (ref 70–99)

## 2013-05-24 MED ORDER — CITALOPRAM HYDROBROMIDE 20 MG PO TABS: 20.0000 mg | ORAL_TABLET | Freq: Every day | ORAL | Status: DC

## 2013-05-24 NOTE — Progress Notes (Signed)
Subjective:    Patient ID: [name], female    DOB: 1941-07-13, 72 y.o.   MRN: [id number]  Headache  This is a new problem. The current episode started more than 1 month ago. The problem occurs daily. The problem has been gradually worsening. The pain is located in the occipital region. The pain radiates to the upper back and left neck. The pain quality is not similar to prior headaches. The quality of the pain is described as aching. The pain is at a severity of 5/10. The pain is moderate. Pertinent negatives include no abdominal pain, back pain, coughing, dizziness, fever, nausea, photophobia, rhinorrhea, sinus pressure, sore throat, vomiting or weakness. The symptoms are aggravated by activity. She has tried nothing for the symptoms. The treatment provided mild relief. Her past medical history is significant for immunosuppression, obesity and sinus disease. There is no history of cancer, cluster headaches, migraine headaches, recent head traumas or TMJ.    72 year old Caucasian female with HIV currently well controlled ON  2 tablets of Ziagen daily along with 150mg  of Epivir daily and 50 mg of Tivicay daily.   Her viral load was undetectable in February but not been in for a visit since then. She states she has been highly adherent to her antiretroviral medications and not missed hardly any doses since I last saw her.  She does have a new complaint of neck pain in her left neck and upper shoulder regions it seems entirely musculoskeletal is aggravated by turning her head to the left. She has not had treatment for this besides over-the-counter analgesics.  Does endorse depressive symptoms including anhedonia but denies suicidal ideation. Will start her on an anti-depressant for those medications.  She also has concerns about her recurrent urinary tract infections now that her chronic use of , has been dc'd per request of medicare.  SReview of Systems  Constitutional: Positive for activity  change and fatigue. Negative for fever, chills, diaphoresis, appetite change and unexpected weight change.  HENT: Negative for congestion, sore throat, rhinorrhea, sneezing, trouble swallowing and sinus pressure.   Eyes: Negative for photophobia and visual disturbance.  Respiratory: Negative for cough, chest tightness, shortness of breath, wheezing and stridor.   Cardiovascular: Negative for chest pain, palpitations and leg swelling.  Gastrointestinal: Negative for nausea, vomiting, abdominal pain, diarrhea, constipation, blood in stool, abdominal distention and anal bleeding.  Genitourinary: Negative for dysuria, hematuria, flank pain and difficulty urinating.  Musculoskeletal: Positive for arthralgias. Negative for myalgias, back pain, joint swelling and gait problem.  Skin: Negative for color change, pallor, rash and wound.  Neurological: Positive for headaches. Negative for dizziness, tremors, weakness and light-headedness.  Hematological: Negative for adenopathy. Does not bruise/bleed easily.  Psychiatric/Behavioral: Positive for dysphoric mood and decreased concentration. Negative for behavioral problems, confusion, sleep disturbance and agitation.       Objective:   Physical Exam  Constitutional: She is oriented to person, place, and time. She appears well-developed and well-nourished. No distress.  HENT:  Head: Normocephalic and atraumatic.  Mouth/Throat: Oropharynx is clear and moist. No oropharyngeal exudate.  Eyes: Conjunctivae and EOM are normal. Pupils are equal, round, and reactive to light. No scleral icterus.  Neck: Normal range of motion. Neck supple. No JVD present.    Cardiovascular: Normal rate, regular rhythm and normal heart sounds.  Exam reveals no gallop and no friction rub.   No murmur heard. Pulmonary/Chest: Effort normal and breath sounds normal. No respiratory distress. She has no wheezes. She has no rales.  She exhibits no tenderness.  Abdominal: She exhibits  no distension and no mass. There is no tenderness. There is no rebound and no guarding.  Musculoskeletal: She exhibits no edema and no tenderness.  Lymphadenopathy:    She has no cervical adenopathy.  Neurological: She is alert and oriented to person, place, and time. She exhibits normal muscle tone. Coordination normal.  Skin: Skin is warm and dry. She is not diaphoretic. No erythema. No pallor.  Psychiatric: She has a normal mood and affect. Her behavior is normal. Judgment and thought content normal.          Assessment & Plan:  HIV: Continue Ziagen 600 mg Epivir 150 mg and TIVICAY 50mg  daily, check VL today and bring back in a few months for recheck of labs.  I spent greater than 45 minutes with the patient including greater than 50% of time in face to face counsel of the patient and in coordination of their care.   Low Vitamin D: encouraged her to followup with PCP, Medicare will NOT let me check the level this year again--mustve been checked at PCP office  CKD: relatively stable  Depression: start celexa  Neck pain: she has MSK neck pain. May benefit from Sports Medicine/ PT/. No need for imaging

## 2013-05-25 LAB — T-HELPER CELL (CD4) - (RCID CLINIC ONLY)
CD4 % Helper T Cell: 32 % — ABNORMAL LOW (ref 33–55)
CD4 T Cell Abs: 920 uL (ref 400–2700)

## 2013-05-26 LAB — HIV-1 RNA QUANT-NO REFLEX-BLD: HIV 1 RNA Quant: <20 copies/mL (ref <20)

## 2013-05-30 ENCOUNTER — Telehealth: Payer: Self-pay | Admitting: Licensed Clinical Social Worker

## 2013-05-30 NOTE — Telephone Encounter (Signed)
I called sports medicine in [place] to schedule an appointment and I could not do it because patient has [place] access, so she will need to see her pcp Dr. Melina Copa. I left the patient a message. I called Dr. Carmie End office and they agreed to see her.

## 2013-05-30 NOTE — Telephone Encounter (Signed)
excellent

## 2013-07-12 ENCOUNTER — Ambulatory Visit: Payer: Medicare Other | Admitting: Orthopedic Surgery

## 2013-07-14 ENCOUNTER — Other Ambulatory Visit: Payer: Self-pay | Admitting: Infectious Disease

## 2013-07-14 DIAGNOSIS — B20 Human immunodeficiency virus [HIV] disease: Secondary | ICD-10-CM

## 2013-07-20 ENCOUNTER — Ambulatory Visit: Payer: Medicare Other | Admitting: Orthopedic Surgery

## 2013-07-20 ENCOUNTER — Encounter: Payer: Self-pay | Admitting: Orthopedic Surgery

## 2013-08-10 ENCOUNTER — Ambulatory Visit: Payer: Medicare Other | Admitting: Orthopedic Surgery

## 2013-08-15 ENCOUNTER — Ambulatory Visit (INDEPENDENT_AMBULATORY_CARE_PROVIDER_SITE_OTHER): Payer: Medicare Other | Admitting: Orthopedic Surgery

## 2013-08-15 ENCOUNTER — Ambulatory Visit (INDEPENDENT_AMBULATORY_CARE_PROVIDER_SITE_OTHER): Payer: Medicare Other

## 2013-08-15 ENCOUNTER — Encounter: Payer: Self-pay | Admitting: Orthopedic Surgery

## 2013-08-15 VITALS — BP 166/99 | Ht 65.0 in | Wt 207.0 lb

## 2013-08-15 DIAGNOSIS — M47812 Spondylosis without myelopathy or radiculopathy, cervical region: Secondary | ICD-10-CM

## 2013-08-15 DIAGNOSIS — M542 Cervicalgia: Secondary | ICD-10-CM

## 2013-08-15 DIAGNOSIS — S139XXA Sprain of joints and ligaments of unspecified parts of neck, initial encounter: Secondary | ICD-10-CM

## 2013-08-15 MED ORDER — DICLOFENAC POTASSIUM 50 MG PO TABS: 50.0000 mg | ORAL_TABLET | Freq: Two times a day (BID) | ORAL | Status: DC
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
## 2013-08-15 NOTE — Patient Instructions (Addendum)
Pick up your medicine from the pharmacy Degenerative Disk Disease Degenerative disk disease is a condition caused by the changes that occur in the cushions of the backbone (spinal disks) as you grow older. Spinal disks are soft and compressible disks located between the bones of the spine (vertebrae). They act like shock absorbers. Degenerative disk disease can affect the whole spine. However, the neck and lower back are most commonly affected. Many changes can occur in the spinal disks with aging, such as:  The spinal disks may dry and shrink.  Small tears may occur in the tough, outer covering of the disk (annulus).  The disk space may become smaller due to loss of water.  Abnormal growths in the bone (spurs) may occur. This can put pressure on the nerve roots exiting the spinal canal, causing pain.  The spinal canal may become narrowed. CAUSES  Degenerative disk disease is a condition caused by the changes that occur in the spinal disks with aging. The exact cause is not known, but there is a genetic basis for many patients. Degenerative changes can occur due to loss of fluid in the disk. This makes the disk thinner and reduces the space between the backbones. Small cracks can develop in the outer layer of the disk. This can lead to the breakdown of the disk. You are more likely to get degenerative disk disease if you are overweight. Smoking cigarettes and doing heavy work such as weightlifting can also increase your risk of this condition. Degenerative changes can start after a sudden injury. Growth of bone spurs can compress the nerve roots and cause pain.  SYMPTOMS  The symptoms vary from person to person. Some people may have no pain, while others have severe pain. The pain may be so severe that it can limit your activities. The location of the pain depends on the part of your backbone that is affected. You will have neck or arm pain if a disk in the neck area is affected. You will have pain  in your back, buttocks, or legs if a disk in the lower back is affected. The pain becomes worse while bending, reaching up, or with twisting movements. The pain may start gradually and then get worse as time passes. It may also start after a major or minor injury. You may feel numbness or tingling in the arms or legs.  DIAGNOSIS  Your caregiver will ask you about your symptoms and about activities or habits that may cause the pain. He or she may also ask about any injuries, diseases, or treatments you have had earlier. Your caregiver will examine you to check for the range of movement that is possible in the affected area, to check for strength in your extremities, and to check for sensation in the areas of the arms and legs supplied by different nerve roots. An X-ray of the spine may be taken. Your caregiver may suggest other imaging tests, such as magnetic resonance imaging (MRI), if needed.  TREATMENT  Treatment includes rest, modifying your activities, and applying ice and heat. Your caregiver may prescribe medicines to reduce your pain and may ask you to do some exercises to strengthen your back. In some cases, you may need surgery. You and your caregiver will decide on the treatment that is best for you. HOME CARE INSTRUCTIONS   Follow proper lifting and walking techniques as advised by your caregiver.  Maintain good posture.  Exercise regularly as advised.  Perform relaxation exercises.  Change your sitting, standing,  and sleeping habits as advised. Change positions frequently.  Lose weight as advised.  Stop smoking if you smoke.  Wear supportive footwear. SEEK MEDICAL CARE IF:  Your pain does not go away within 1 to 4 weeks. SEEK IMMEDIATE MEDICAL CARE IF:   Your pain is severe.  You notice weakness in your arms, hands, or legs.  You begin to lose control of your bladder or bowel movements. MAKE SURE YOU:   Understand these instructions.  Will watch your condition.  Will  get help right away if you are not doing well or get worse. Document Released: 08/09/2007 Document Revised: 01/04/2012 Document Reviewed: 08/09/2007 [facility] Patient Information 2014 [place].

## 2013-08-17 ENCOUNTER — Other Ambulatory Visit: Payer: Self-pay | Admitting: Orthopedic Surgery

## 2013-08-17 ENCOUNTER — Telehealth: Payer: Self-pay | Admitting: Orthopedic Surgery

## 2013-08-17 ENCOUNTER — Other Ambulatory Visit: Payer: Self-pay | Admitting: *Deleted

## 2013-08-17 MED ORDER — NAPROXEN 500 MG PO TABS: 500.0000 mg | ORAL_TABLET | Freq: Two times a day (BID) | ORAL | Status: DC

## 2013-08-17 NOTE — Telephone Encounter (Signed)
Tammy, did you get this?

## 2013-08-17 NOTE — Telephone Encounter (Signed)
Called in prescription to [facility]

## 2013-08-17 NOTE — Telephone Encounter (Signed)
NAPROXEN CALL IN

## 2013-08-17 NOTE — Telephone Encounter (Signed)
[facility] said [employer] will not cover the Diclofenac.  She said it will cover Naproxen   Her # [phone]

## 2013-08-31 ENCOUNTER — Other Ambulatory Visit: Payer: Self-pay

## 2013-11-13 ENCOUNTER — Other Ambulatory Visit: Payer: Medicare Other

## 2013-11-15 ENCOUNTER — Other Ambulatory Visit: Payer: Medicare Other

## 2013-11-15 DIAGNOSIS — B20 Human immunodeficiency virus [HIV] disease: Secondary | ICD-10-CM

## 2013-11-15 DIAGNOSIS — E785 Hyperlipidemia, unspecified: Secondary | ICD-10-CM

## 2013-11-15 LAB — CBC WITH DIFFERENTIAL/PLATELET
BASOS PCT: 0 % (ref 0–1)
Basophils Absolute: 0 10*3/uL (ref 0.0–0.1)
EOS ABS: 0.1 10*3/uL (ref 0.0–0.7)
EOS PCT: 2 % (ref 0–5)
HCT: 42.7 % (ref 36.0–46.0)
Hemoglobin: 14.7 g/dL (ref 12.0–15.0)
LYMPHS ABS: 2.2 10*3/uL (ref 0.7–4.0)
Lymphocytes Relative: 31 % (ref 12–46)
MCH: 33.6 pg (ref 26.0–34.0)
MCHC: 34.4 g/dL (ref 30.0–36.0)
MCV: 97.5 fL (ref 78.0–100.0)
Monocytes Absolute: 0.8 10*3/uL (ref 0.1–1.0)
Monocytes Relative: 11 % (ref 3–12)
Neutro Abs: 4 10*3/uL (ref 1.7–7.7)
Neutrophils Relative %: 56 % (ref 43–77)
PLATELETS: 210 10*3/uL (ref 150–400)
RBC: 4.38 MIL/uL (ref 3.87–5.11)
RDW: 13.3 % (ref 11.5–15.5)
WBC: 7.1 10*3/uL (ref 4.0–10.5)

## 2013-11-15 LAB — COMPLETE METABOLIC PANEL WITH GFR
ALBUMIN: 4.4 g/dL (ref 3.5–5.2)
ALT: 29 U/L (ref 0–35)
AST: 18 U/L (ref 0–37)
Alkaline Phosphatase: 87 U/L (ref 39–117)
BUN: 29 mg/dL — ABNORMAL HIGH (ref 6–23)
CALCIUM: 9.5 mg/dL (ref 8.4–10.5)
CHLORIDE: 107 meq/L (ref 96–112)
CO2: 25 meq/L (ref 19–32)
Creat: 1.52 mg/dL — ABNORMAL HIGH (ref 0.50–1.10)
GFR, Est African American: 39 mL/min — ABNORMAL LOW
GFR, Est Non African American: 34 mL/min — ABNORMAL LOW
Glucose, Bld: 79 mg/dL (ref 70–99)
POTASSIUM: 4.1 meq/L (ref 3.5–5.3)
SODIUM: 141 meq/L (ref 135–145)
TOTAL PROTEIN: 6.9 g/dL (ref 6.0–8.3)
Total Bilirubin: 0.6 mg/dL (ref 0.3–1.2)

## 2013-11-15 LAB — LIPID PANEL
CHOL/HDL RATIO: 7.8 ratio
CHOLESTEROL: 258 mg/dL — AB (ref 0–200)
HDL: 33 mg/dL — AB (ref >39)
LDL Cholesterol: 146 mg/dL — ABNORMAL HIGH (ref 0–99)
Triglycerides: 393 mg/dL — ABNORMAL HIGH (ref <150)
VLDL: 79 mg/dL — AB (ref 0–40)

## 2013-11-15 LAB — RPR

## 2013-11-16 LAB — T-HELPER CELL (CD4) - (RCID CLINIC ONLY)
CD4 T CELL HELPER: 23 % — AB (ref 33–55)
CD4 T Cell Abs: 560 /uL (ref 400–2700)

## 2013-11-16 LAB — HIV-1 RNA QUANT-NO REFLEX-BLD

## 2013-11-22 ENCOUNTER — Other Ambulatory Visit: Payer: Self-pay | Admitting: Infectious Disease

## 2013-11-22 DIAGNOSIS — B20 Human immunodeficiency virus [HIV] disease: Secondary | ICD-10-CM

## 2013-11-27 ENCOUNTER — Ambulatory Visit: Payer: Medicare Other | Admitting: Infectious Disease

## 2013-12-11 ENCOUNTER — Ambulatory Visit: Payer: Medicare Other | Admitting: Infectious Disease

## 2013-12-25 ENCOUNTER — Other Ambulatory Visit: Payer: Self-pay | Admitting: Infectious Disease

## 2013-12-25 DIAGNOSIS — B20 Human immunodeficiency virus [HIV] disease: Secondary | ICD-10-CM

## 2013-12-26 ENCOUNTER — Ambulatory Visit: Payer: Medicare Other | Admitting: Infectious Disease

## 2013-12-28 ENCOUNTER — Ambulatory Visit: Payer: Medicare Other | Admitting: Infectious Disease

## 2014-02-27 ENCOUNTER — Other Ambulatory Visit: Payer: Self-pay | Admitting: Infectious Disease

## 2014-03-08 ENCOUNTER — Other Ambulatory Visit: Payer: Medicare Other

## 2014-03-08 ENCOUNTER — Other Ambulatory Visit: Payer: Self-pay | Admitting: Licensed Clinical Social Worker

## 2014-03-08 ENCOUNTER — Other Ambulatory Visit: Payer: Self-pay | Admitting: *Deleted

## 2014-03-08 DIAGNOSIS — B20 Human immunodeficiency virus [HIV] disease: Secondary | ICD-10-CM

## 2014-03-08 LAB — COMPLETE METABOLIC PANEL WITH GFR
ALK PHOS: 82 U/L (ref 39–117)
ALT: 23 U/L (ref 0–35)
AST: 16 U/L (ref 0–37)
Albumin: 4.6 g/dL (ref 3.5–5.2)
BUN: 22 mg/dL (ref 6–23)
CALCIUM: 9.9 mg/dL (ref 8.4–10.5)
CHLORIDE: 102 meq/L (ref 96–112)
CO2: 24 meq/L (ref 19–32)
Creat: 1.32 mg/dL — ABNORMAL HIGH (ref 0.50–1.10)
GFR, EST AFRICAN AMERICAN: 46 mL/min — AB
GFR, EST NON AFRICAN AMERICAN: 40 mL/min — AB
Glucose, Bld: 87 mg/dL (ref 70–99)
POTASSIUM: 4.4 meq/L (ref 3.5–5.3)
SODIUM: 138 meq/L (ref 135–145)
TOTAL PROTEIN: 7.1 g/dL (ref 6.0–8.3)
Total Bilirubin: 0.7 mg/dL (ref 0.2–1.2)

## 2014-03-08 LAB — CBC WITH DIFFERENTIAL/PLATELET
BASOS ABS: 0 10*3/uL (ref 0.0–0.1)
BASOS PCT: 0 % (ref 0–1)
Eosinophils Absolute: 0.1 10*3/uL (ref 0.0–0.7)
Eosinophils Relative: 2 % (ref 0–5)
HEMATOCRIT: 43.3 % (ref 36.0–46.0)
Hemoglobin: 15.2 g/dL — ABNORMAL HIGH (ref 12.0–15.0)
LYMPHS PCT: 39 % (ref 12–46)
Lymphs Abs: 2.8 10*3/uL (ref 0.7–4.0)
MCH: 33.6 pg (ref 26.0–34.0)
MCHC: 35.1 g/dL (ref 30.0–36.0)
MCV: 95.8 fL (ref 78.0–100.0)
MONO ABS: 0.7 10*3/uL (ref 0.1–1.0)
Monocytes Relative: 10 % (ref 3–12)
NEUTROS ABS: 3.6 10*3/uL (ref 1.7–7.7)
Neutrophils Relative %: 49 % (ref 43–77)
PLATELETS: 195 10*3/uL (ref 150–400)
RBC: 4.52 MIL/uL (ref 3.87–5.11)
RDW: 14.5 % (ref 11.5–15.5)
WBC: 7.3 10*3/uL (ref 4.0–10.5)

## 2014-03-08 MED ORDER — ABACAVIR SULFATE 300 MG PO TABS: ORAL_TABLET | ORAL | Status: DC

## 2014-03-08 MED ORDER — LAMIVUDINE 150 MG PO TABS: 150.0000 mg | ORAL_TABLET | Freq: Every day | ORAL | Status: DC

## 2014-03-08 MED ORDER — DOLUTEGRAVIR SODIUM 50 MG PO TABS: ORAL_TABLET | ORAL | Status: DC

## 2014-03-09 LAB — T-HELPER CELL (CD4) - (RCID CLINIC ONLY)
CD4 % Helper T Cell: 26 % — ABNORMAL LOW (ref 33–55)
CD4 T CELL ABS: 780 /uL (ref 400–2700)

## 2014-03-10 LAB — HIV-1 RNA QUANT-NO REFLEX-BLD
HIV 1 RNA Quant: <20 copies/mL (ref <20)
HIV-1 RNA Quant, Log: <1.3 {Log} (ref <1.30)

## 2014-03-21 ENCOUNTER — Ambulatory Visit (INDEPENDENT_AMBULATORY_CARE_PROVIDER_SITE_OTHER): Payer: Medicare Other | Admitting: Infectious Disease

## 2014-03-21 ENCOUNTER — Encounter: Payer: Self-pay | Admitting: Infectious Disease

## 2014-03-21 VITALS — BP 148/103 | HR 73 | Temp 98.5°F | Wt 212.0 lb

## 2014-03-21 DIAGNOSIS — L853 Xerosis cutis: Secondary | ICD-10-CM

## 2014-03-21 DIAGNOSIS — F32A Depression, unspecified: Secondary | ICD-10-CM

## 2014-03-21 DIAGNOSIS — F3289 Other specified depressive episodes: Secondary | ICD-10-CM

## 2014-03-21 DIAGNOSIS — E785 Hyperlipidemia, unspecified: Secondary | ICD-10-CM

## 2014-03-21 DIAGNOSIS — R635 Abnormal weight gain: Secondary | ICD-10-CM

## 2014-03-21 DIAGNOSIS — E538 Deficiency of other specified B group vitamins: Secondary | ICD-10-CM

## 2014-03-21 DIAGNOSIS — Z113 Encounter for screening for infections with a predominantly sexual mode of transmission: Secondary | ICD-10-CM

## 2014-03-21 DIAGNOSIS — M542 Cervicalgia: Secondary | ICD-10-CM

## 2014-03-21 DIAGNOSIS — N184 Chronic kidney disease, stage 4 (severe): Secondary | ICD-10-CM

## 2014-03-21 DIAGNOSIS — M8580 Other specified disorders of bone density and structure, unspecified site: Secondary | ICD-10-CM

## 2014-03-21 DIAGNOSIS — L738 Other specified follicular disorders: Secondary | ICD-10-CM

## 2014-03-21 DIAGNOSIS — F329 Major depressive disorder, single episode, unspecified: Secondary | ICD-10-CM

## 2014-03-21 DIAGNOSIS — B20 Human immunodeficiency virus [HIV] disease: Secondary | ICD-10-CM

## 2014-03-21 MED ORDER — ABACAVIR-DOLUTEGRAVIR-LAMIVUD 600-50-300 MG PO TABS: 1.0000 | ORAL_TABLET | Freq: Every day | ORAL | Status: DC

## 2014-03-21 MED ORDER — CITALOPRAM HYDROBROMIDE 20 MG PO TABS: 20.0000 mg | ORAL_TABLET | Freq: Every day | ORAL | Status: DC

## 2014-03-21 NOTE — Progress Notes (Signed)
  Subjective:    Patient ID: [name], female    DOB: [birth year], 73 y.o.   MRN: [id number]  HPI  73 year old Caucasian female with HIV currently well controlled ON  2 tablets of Ziagen daily along with 150mg  of Epivir daily and 50 mg of Tivicay daily.   Her viral load was undetectable i  She again endorse depressive symptoms including anhedonia but denies suicidal ideation. I wrote to start her on an anti-depressant but today she had no idea that I had rx celexa.  She also was concerned with her inability to lose weight despite drinking vegetable smoothies in the am,.  I reviewed her CrCL and I have proposed changing her to [facility] which will end up giving her more lamivudine than she would ordinarily be given with her GFR but I think this will result in no toxicity and will ease her pill burden.  SReview of Systems  Constitutional: Positive for activity change and fatigue. Negative for chills, diaphoresis, appetite change and unexpected weight change.  HENT: Negative for congestion, sneezing and trouble swallowing.   Eyes: Negative for visual disturbance.  Respiratory: Negative for chest tightness, shortness of breath, wheezing and stridor.   Cardiovascular: Negative for chest pain, palpitations and leg swelling.  Gastrointestinal: Negative for diarrhea, constipation, blood in stool, abdominal distention and anal bleeding.  Genitourinary: Negative for dysuria, hematuria, flank pain and difficulty urinating.  Musculoskeletal: Negative for gait problem, joint swelling and myalgias.  Skin: Negative for color change, pallor, rash and wound.  Neurological: Negative for tremors and light-headedness.  Hematological: Negative for adenopathy. Does not bruise/bleed easily.  Psychiatric/Behavioral: Positive for dysphoric mood and decreased concentration. Negative for behavioral problems, confusion, sleep disturbance, self-injury and agitation. The patient is nervous/anxious.          Objective:   Physical Exam  Constitutional: She is oriented to person, place, and time. She appears well-developed and well-nourished. No distress.  HENT:  Head: Normocephalic and atraumatic.  Mouth/Throat: Oropharynx is clear and moist. No oropharyngeal exudate.  Eyes: Conjunctivae and EOM are normal. No scleral icterus.  Neck: Normal range of motion. Neck supple. No JVD present.    Cardiovascular: Normal rate and regular rhythm.   Pulmonary/Chest: Effort normal. No respiratory distress. She has no wheezes.  Abdominal: Soft. She exhibits no distension.  Musculoskeletal: She exhibits no edema and no tenderness.  Neurological: She is alert and oriented to person, place, and time. She exhibits normal muscle tone. Coordination normal.  Skin: Skin is warm and dry. She is not diaphoretic. No erythema. No pallor.  Psychiatric: Judgment and thought content normal.          Assessment & Plan:  HIV: Change to [place], bring back to clinic to review meds with pharmacy this Tuesday and RTC to review with me in one months time  Depression: resent celexa  I spent greater than 25 minutes with the patient including greater than 50% of time in face to face counsel of the patient and in coordination of their care.  Weight gain: she requested I check her thyroid fxn and will check again. I suspect her smoothies have more calories and higher glycemic index and while being good for her may be making it harder for her to lose weight.  Low Vitamin D: encouraged her to followup with PCP, Medicare will NOT let me check the level this year again--mustve been checked at PCP office  CKD: relatively stable

## 2014-03-21 NOTE — Patient Instructions (Signed)
I am going to simplify your regimen to   [facility] which will be ONE PURPLE PILL ONCE A DAY  YOU WILL THEN STOP THE   [place] [facility] CLINIC IN ONE MONTH WITH ALL OF YOUR MEDICINES

## 2014-03-22 LAB — TSH: TSH: 2.85 u[IU]/mL (ref 0.350–4.500)

## 2014-03-28 ENCOUNTER — Ambulatory Visit: Payer: Medicare Other | Admitting: Pharmacist Clinician (PhC)/ Clinical Pharmacy Specialist

## 2014-03-28 NOTE — Progress Notes (Signed)
Patient ID: [name], female   DOB: 1941/03/28, 73 y.o.   MRN: [id number] HPI: OPIE LIAO is a 73 y.o. female who is here for f/u with clinic  Allergies: Allergies  Allergen Reactions  . Bee Venom Anaphylaxis  . Tenofovir Disoproxil Fumarate [Tenofovir Disoproxil Fumarate] Other (See Comments)    Renal failure, (08/27/2006)  . Codeine Itching  . Haloperidol Lactate Other (See Comments)    Reaction is muscle tension, causes severe spasms in face and neck. Forces eyes to roll in the back of the head  . Morphine Sulfate Itching  . Navane [Thiothixene] Other (See Comments)    Same muscle spasm reaction as haldol  . Prochlorperazine Edisylate Other (See Comments)    Same reaction as haldol   . Promethazine Hcl Other (See Comments)    Same as haldol   . Propoxyphene N-Acetaminophen Itching and Nausea And Vomiting    Vitals:    Past Medical History: Past Medical History  Diagnosis Date  . Bronchitis   . HIV (human immunodeficiency virus infection)   . Hypertension   . History of TIAs 1981    left side weakness  . COPD (chronic obstructive pulmonary disease)   . Kidney disease related to HIV infection     pt states she has to have her creatinine level checked often   . Depression   . Anxiety   . Arthritis   . Peripheral neuropathy     bilateral feet    Social History: History   Social History  . Marital Status: Widowed    Spouse Name: N/A    Number of Children: N/A  . Years of Education: N/A   Occupational History  . retired    Social History Main Topics  . Smoking status: Never Smoker   . Smokeless tobacco: Never Used  . Alcohol Use: No  . Drug Use: No  . Sexual Activity: Not Currently     Comment: declined condoms   Other Topics Concern  . Not on file   Social History Narrative  . No narrative on file    Previous Regimen:   Current Regimen: DTG+3TC+ABC  Labs: HIV 1 RNA Quant (copies/mL)  Date Value  03/08/2014 <20   11/15/2013 <20    05/24/2013 <20      CD4 T Cell Abs (/uL)  Date Value  03/08/2014 780   11/15/2013 560   05/24/2013 920      Hep B S Ab (no units)  Date Value  12/20/2006 No      Hepatitis B Surface Ag (no units)  Date Value  12/20/2006 No      HCV Ab (no units)  Date Value  12/20/2006 No     CrCl: The CrCl is unknown because both a height and weight (above a minimum accepted value) are required for this calculation.  Lipids:    Component Value Date/Time   CHOL 258* 11/15/2013 1000   TRIG 393* 11/15/2013 1000   HDL 33* 11/15/2013 1000   CHOLHDL 7.8 11/15/2013 1000   VLDL 79* 11/15/2013 1000   LDLCALC 146* 11/15/2013 1000    Assessment: 73 yo with long standing HIV from transfusion?. She is here for counseling on her meds. She is doing quite well on the current regimen. Dr [name] has decided to start her on Triumeq for convenience and her scr has really trended down. I called the pharmacy to make sure they have Triumeq on board. They do. The pharmacist there asked me if its  ok to cont her on the separate component with the new dosing until their supplies run out (within a month or so). I told them that they need to discuss it with the pt. They agreed and will monitor the pt closely.  Recommendations: Cont ABC 600mg  PO qday Cont 3TC 300mg  PO qday Cont DTG 50mg  PO qday Start Triumeq after supply is exhausted  Wilfred Lacy, PharmD Clinical Infectious Disease [facility] 03/28/2014, 4:19 PM

## 2014-03-29 ENCOUNTER — Other Ambulatory Visit: Payer: Self-pay | Admitting: Licensed Clinical Social Worker

## 2014-03-29 DIAGNOSIS — B20 Human immunodeficiency virus [HIV] disease: Secondary | ICD-10-CM

## 2014-03-29 MED ORDER — LAMIVUDINE 150 MG PO TABS: 150.0000 mg | ORAL_TABLET | Freq: Every day | ORAL | Status: DC

## 2014-04-25 ENCOUNTER — Ambulatory Visit: Payer: Medicare Other | Admitting: Infectious Disease

## 2014-05-02 ENCOUNTER — Ambulatory Visit (INDEPENDENT_AMBULATORY_CARE_PROVIDER_SITE_OTHER): Payer: Medicare Other | Admitting: Infectious Disease

## 2014-05-02 ENCOUNTER — Encounter: Payer: Self-pay | Admitting: Infectious Disease

## 2014-05-02 VITALS — BP 151/88 | HR 72 | Temp 98.0°F | Ht 64.0 in | Wt 214.0 lb

## 2014-05-02 DIAGNOSIS — G609 Hereditary and idiopathic neuropathy, unspecified: Secondary | ICD-10-CM

## 2014-05-02 DIAGNOSIS — N183 Chronic kidney disease, stage 3 unspecified: Secondary | ICD-10-CM

## 2014-05-02 DIAGNOSIS — G909 Disorder of the autonomic nervous system, unspecified: Secondary | ICD-10-CM

## 2014-05-02 DIAGNOSIS — F3289 Other specified depressive episodes: Secondary | ICD-10-CM

## 2014-05-02 DIAGNOSIS — F329 Major depressive disorder, single episode, unspecified: Secondary | ICD-10-CM

## 2014-05-02 DIAGNOSIS — B20 Human immunodeficiency virus [HIV] disease: Secondary | ICD-10-CM

## 2014-05-02 DIAGNOSIS — F32A Depression, unspecified: Secondary | ICD-10-CM

## 2014-05-02 DIAGNOSIS — G63 Polyneuropathy in diseases classified elsewhere: Secondary | ICD-10-CM

## 2014-05-02 MED ORDER — GABAPENTIN (ONCE-DAILY) 300 MG PO TABS: 30.0000 mg | ORAL_TABLET | Freq: Every day | ORAL | Status: DC

## 2014-05-02 NOTE — Progress Notes (Signed)
Subjective:    Patient ID: [name], female    DOB: [birth year], 73 y.o.   MRN: [id number]  HPI   73 year old Caucasian female with HIV currently well controlled ON  2 tablets of Ziagen daily along with 150mg  of Epivir daily and 50 mg of Tivicay daily.   Her viral load was undetectable and cd4 count healthy.  Lab Results  Component Value Date   HIV1RNAQUANT <20 03/08/2014   Lab Results  Component Value Date   CD4TABS 780 03/08/2014   CD4TABS 560 11/15/2013   CD4TABS 920 05/24/2013     She was supposed to be changed to [facility] but her pharmacy asked if he could stay on the component parts until they had used up the supply that ordered for her and she is continued therefore component parts but should be switching over to single tablet TRIUMEQ at the end of the month.  She has complaints of severe neuropathy but is not improving I suggested addition of gabapentin.  SReview of Systems  Constitutional: Positive for fatigue. Negative for chills, diaphoresis, appetite change and unexpected weight change.  HENT: Negative for congestion, sneezing and trouble swallowing.   Eyes: Negative for visual disturbance.  Respiratory: Negative for chest tightness, shortness of breath, wheezing and stridor.   Cardiovascular: Negative for chest pain, palpitations and leg swelling.  Gastrointestinal: Negative for diarrhea, constipation, blood in stool, abdominal distention and anal bleeding.  Genitourinary: Negative for dysuria, hematuria, flank pain and difficulty urinating.  Musculoskeletal: Negative for gait problem, joint swelling and myalgias.  Skin: Negative for color change, pallor, rash and wound.  Neurological: Positive for numbness. Negative for tremors and light-headedness.  Hematological: Negative for adenopathy. Does not bruise/bleed easily.  Psychiatric/Behavioral: Positive for dysphoric mood and decreased concentration. Negative for behavioral problems, confusion, sleep disturbance,  self-injury and agitation. The patient is nervous/anxious.        Objective:   Physical Exam  Constitutional: She is oriented to person, place, and time. She appears well-developed and well-nourished. No distress.  HENT:  Head: Normocephalic and atraumatic.  Mouth/Throat: Oropharynx is clear and moist. No oropharyngeal exudate.  Eyes: Conjunctivae and EOM are normal. No scleral icterus.  Neck: Normal range of motion. Neck supple. No JVD present.  Cardiovascular: Normal rate and regular rhythm.   Pulmonary/Chest: Effort normal. No respiratory distress. She has no wheezes.  Abdominal: Soft. She exhibits no distension.  Musculoskeletal: She exhibits no edema and no tenderness.  Neurological: She is alert and oriented to person, place, and time. She exhibits normal muscle tone. Coordination normal.  Skin: Skin is warm and dry. She is not diaphoretic. No erythema. No pallor.  Psychiatric: Judgment and thought content normal.          Assessment & Plan:  HIV: Change to [place] at the end of her period call us when this changes made Sunday [place] micturating is and line. Bring back to clinic in 3 months time for recheck viral load and CD4 count I spent greater than 25 minutes with the patient including greater than 50% of time in face to face counsel of the patient and in coordination of their care.   HIV neuropathy: Add gabapentin 300 mg at bedtime if she tolerates this we can have her to try a dose during the day when she is not driving  Depression: Celexa made her feel poorly continue amitriptyline at bedtime for now  Low Vitamin D: She is taking vitamin D but space and out from her antiretrovirals which is well  water do   CKD: relatively stable

## 2014-05-03 ENCOUNTER — Other Ambulatory Visit: Payer: Self-pay | Admitting: *Deleted

## 2014-05-03 DIAGNOSIS — G609 Hereditary and idiopathic neuropathy, unspecified: Secondary | ICD-10-CM

## 2014-05-03 MED ORDER — GABAPENTIN 300 MG PO CAPS: 300.0000 mg | ORAL_CAPSULE | Freq: Every day | ORAL | Status: DC

## 2014-05-31 ENCOUNTER — Observation Stay (HOSPITAL_COMMUNITY)
Admission: EM | Admit: 2014-05-31 | Discharge: 2014-06-02 | Disposition: A | Discharge status: Home / Self Care | Payer: Medicare Other | Attending: Internal Medicine | Admitting: Internal Medicine

## 2014-05-31 ENCOUNTER — Encounter (HOSPITAL_COMMUNITY): Payer: Self-pay | Admitting: Emergency Medicine

## 2014-05-31 ENCOUNTER — Observation Stay (HOSPITAL_COMMUNITY): Payer: Medicare Other

## 2014-05-31 ENCOUNTER — Emergency Department (HOSPITAL_COMMUNITY): Payer: Medicare Other

## 2014-05-31 DIAGNOSIS — W1809XA Striking against other object with subsequent fall, initial encounter: Secondary | ICD-10-CM | POA: Diagnosis not present

## 2014-05-31 DIAGNOSIS — Z23 Encounter for immunization: Secondary | ICD-10-CM | POA: Insufficient documentation

## 2014-05-31 DIAGNOSIS — M791 Myalgia, unspecified site: Secondary | ICD-10-CM | POA: Diagnosis present

## 2014-05-31 DIAGNOSIS — S199XXA Unspecified injury of neck, initial encounter: Secondary | ICD-10-CM

## 2014-05-31 DIAGNOSIS — S0993XA Unspecified injury of face, initial encounter: Secondary | ICD-10-CM | POA: Insufficient documentation

## 2014-05-31 DIAGNOSIS — G609 Hereditary and idiopathic neuropathy, unspecified: Secondary | ICD-10-CM | POA: Insufficient documentation

## 2014-05-31 DIAGNOSIS — S298XXA Other specified injuries of thorax, initial encounter: Secondary | ICD-10-CM | POA: Insufficient documentation

## 2014-05-31 DIAGNOSIS — R55 Syncope and collapse: Secondary | ICD-10-CM

## 2014-05-31 DIAGNOSIS — F411 Generalized anxiety disorder: Secondary | ICD-10-CM | POA: Insufficient documentation

## 2014-05-31 DIAGNOSIS — Z8673 Personal history of transient ischemic attack (TIA), and cerebral infarction without residual deficits: Secondary | ICD-10-CM | POA: Insufficient documentation

## 2014-05-31 DIAGNOSIS — I129 Hypertensive chronic kidney disease with stage 1 through stage 4 chronic kidney disease, or unspecified chronic kidney disease: Secondary | ICD-10-CM | POA: Insufficient documentation

## 2014-05-31 DIAGNOSIS — IMO0002 Reserved for concepts with insufficient information to code with codable children: Secondary | ICD-10-CM | POA: Insufficient documentation

## 2014-05-31 DIAGNOSIS — Y929 Unspecified place or not applicable: Secondary | ICD-10-CM | POA: Insufficient documentation

## 2014-05-31 DIAGNOSIS — E785 Hyperlipidemia, unspecified: Secondary | ICD-10-CM | POA: Diagnosis present

## 2014-05-31 DIAGNOSIS — S0990XA Unspecified injury of head, initial encounter: Principal | ICD-10-CM

## 2014-05-31 DIAGNOSIS — J4489 Other specified chronic obstructive pulmonary disease: Secondary | ICD-10-CM | POA: Insufficient documentation

## 2014-05-31 DIAGNOSIS — N189 Chronic kidney disease, unspecified: Secondary | ICD-10-CM | POA: Insufficient documentation

## 2014-05-31 DIAGNOSIS — S3981XA Other specified injuries of abdomen, initial encounter: Secondary | ICD-10-CM | POA: Diagnosis not present

## 2014-05-31 DIAGNOSIS — R109 Unspecified abdominal pain: Secondary | ICD-10-CM | POA: Diagnosis not present

## 2014-05-31 DIAGNOSIS — M129 Arthropathy, unspecified: Secondary | ICD-10-CM | POA: Insufficient documentation

## 2014-05-31 DIAGNOSIS — N259 Disorder resulting from impaired renal tubular function, unspecified: Secondary | ICD-10-CM | POA: Diagnosis present

## 2014-05-31 DIAGNOSIS — Z79899 Other long term (current) drug therapy: Secondary | ICD-10-CM | POA: Diagnosis not present

## 2014-05-31 DIAGNOSIS — Z21 Asymptomatic human immunodeficiency virus [HIV] infection status: Secondary | ICD-10-CM | POA: Diagnosis not present

## 2014-05-31 DIAGNOSIS — J449 Chronic obstructive pulmonary disease, unspecified: Secondary | ICD-10-CM | POA: Insufficient documentation

## 2014-05-31 DIAGNOSIS — B20 Human immunodeficiency virus [HIV] disease: Secondary | ICD-10-CM

## 2014-05-31 DIAGNOSIS — I951 Orthostatic hypotension: Secondary | ICD-10-CM

## 2014-05-31 DIAGNOSIS — Y939 Activity, unspecified: Secondary | ICD-10-CM | POA: Insufficient documentation

## 2014-05-31 DIAGNOSIS — F329 Major depressive disorder, single episode, unspecified: Secondary | ICD-10-CM | POA: Diagnosis not present

## 2014-05-31 DIAGNOSIS — F3289 Other specified depressive episodes: Secondary | ICD-10-CM | POA: Diagnosis not present

## 2014-05-31 DIAGNOSIS — S2239XA Fracture of one rib, unspecified side, initial encounter for closed fracture: Secondary | ICD-10-CM

## 2014-05-31 DIAGNOSIS — R252 Cramp and spasm: Secondary | ICD-10-CM

## 2014-05-31 DIAGNOSIS — S2232XA Fracture of one rib, left side, initial encounter for closed fracture: Secondary | ICD-10-CM

## 2014-05-31 DIAGNOSIS — I10 Essential (primary) hypertension: Secondary | ICD-10-CM

## 2014-05-31 LAB — CBC WITH DIFFERENTIAL/PLATELET
BASOS PCT: 1 % (ref 0–1)
Basophils Absolute: 0 10*3/uL (ref 0.0–0.1)
Eosinophils Absolute: 0.1 10*3/uL (ref 0.0–0.7)
Eosinophils Relative: 3 % (ref 0–5)
HCT: 43.2 % (ref 36.0–46.0)
HEMOGLOBIN: 14.9 g/dL (ref 12.0–15.0)
Lymphocytes Relative: 38 % (ref 12–46)
Lymphs Abs: 1.9 10*3/uL (ref 0.7–4.0)
MCH: 35.4 pg — AB (ref 26.0–34.0)
MCHC: 34.5 g/dL (ref 30.0–36.0)
MCV: 102.6 fL — ABNORMAL HIGH (ref 78.0–100.0)
MONO ABS: 0.5 10*3/uL (ref 0.1–1.0)
Monocytes Relative: 10 % (ref 3–12)
Neutro Abs: 2.5 10*3/uL (ref 1.7–7.7)
Neutrophils Relative %: 48 % (ref 43–77)
Platelets: 172 10*3/uL (ref 150–400)
RBC: 4.21 MIL/uL (ref 3.87–5.11)
RDW: 13.3 % (ref 11.5–15.5)
WBC: 5 10*3/uL (ref 4.0–10.5)

## 2014-05-31 LAB — RAPID URINE DRUG SCREEN, HOSP PERFORMED
AMPHETAMINES: NOT DETECTED
BENZODIAZEPINES: NOT DETECTED
Barbiturates: NOT DETECTED
COCAINE: NOT DETECTED
Opiates: NOT DETECTED
Tetrahydrocannabinol: NOT DETECTED

## 2014-05-31 LAB — URINALYSIS, ROUTINE W REFLEX MICROSCOPIC
Bilirubin Urine: NEGATIVE
GLUCOSE, UA: NEGATIVE mg/dL
HGB URINE DIPSTICK: NEGATIVE
Ketones, ur: NEGATIVE mg/dL
Leukocytes, UA: NEGATIVE
Nitrite: NEGATIVE
PH: 6.5 (ref 5.0–8.0)
PROTEIN: NEGATIVE mg/dL
Specific Gravity, Urine: 1.01 (ref 1.005–1.030)
Urobilinogen, UA: 1 mg/dL (ref 0.0–1.0)

## 2014-05-31 LAB — BASIC METABOLIC PANEL
Anion gap: 10 (ref 5–15)
BUN: 24 mg/dL — AB (ref 6–23)
CO2: 27 mEq/L (ref 19–32)
CREATININE: 1.46 mg/dL — AB (ref 0.50–1.10)
Calcium: 9.3 mg/dL (ref 8.4–10.5)
Chloride: 104 mEq/L (ref 96–112)
GFR calc non Af Amer: 34 mL/min — ABNORMAL LOW (ref >90)
GFR, EST AFRICAN AMERICAN: 40 mL/min — AB (ref >90)
Glucose, Bld: 114 mg/dL — ABNORMAL HIGH (ref 70–99)
Potassium: 5 mEq/L (ref 3.7–5.3)
Sodium: 141 mEq/L (ref 137–147)

## 2014-05-31 LAB — TROPONIN I

## 2014-05-31 LAB — PRO B NATRIURETIC PEPTIDE: Pro B Natriuretic peptide (BNP): 133.5 pg/mL — ABNORMAL HIGH (ref 0–125)

## 2014-05-31 LAB — MAGNESIUM: Magnesium: 2.2 mg/dL (ref 1.5–2.5)

## 2014-05-31 MED ORDER — LUBIPROSTONE 24 MCG PO CAPS
ORAL_CAPSULE | ORAL | Status: AC
  Filled 2014-05-31: qty 1

## 2014-05-31 MED ORDER — DOCUSATE SODIUM 100 MG PO CAPS
100.0000 mg | ORAL_CAPSULE | Freq: Two times a day (BID) | ORAL | Status: DC
  Administered 2014-05-31 – 2014-06-02 (×4): 100 mg via ORAL
  Filled 2014-05-31 (×4): qty 1

## 2014-05-31 MED ORDER — ENOXAPARIN SODIUM 40 MG/0.4ML ~~LOC~~ SOLN
40.0000 mg | SUBCUTANEOUS | Status: DC
  Administered 2014-05-31 – 2014-06-02 (×3): 40 mg via SUBCUTANEOUS
  Filled 2014-05-31 (×3): qty 0.4

## 2014-05-31 MED ORDER — MORPHINE SULFATE 2 MG/ML IJ SOLN: 1.0000 mg | INTRAMUSCULAR | Status: DC | PRN

## 2014-05-31 MED ORDER — SORBITOL 70 % SOLN: 30.0000 mL | Freq: Every day | Status: DC | PRN

## 2014-05-31 MED ORDER — MORPHINE SULFATE 4 MG/ML IJ SOLN
4.0000 mg | Freq: Once | INTRAMUSCULAR | Status: AC
  Administered 2014-05-31: 4 mg via INTRAVENOUS
  Filled 2014-05-31: qty 1

## 2014-05-31 MED ORDER — BISACODYL 10 MG RE SUPP: 10.0000 mg | Freq: Every day | RECTAL | Status: DC | PRN

## 2014-05-31 MED ORDER — ONDANSETRON HCL 4 MG PO TABS: 4.0000 mg | ORAL_TABLET | Freq: Four times a day (QID) | ORAL | Status: DC | PRN

## 2014-05-31 MED ORDER — ACETAMINOPHEN 325 MG PO TABS: 650.0000 mg | ORAL_TABLET | Freq: Four times a day (QID) | ORAL | Status: DC | PRN

## 2014-05-31 MED ORDER — LIDOCAINE 5 % EX PTCH
1.0000 | MEDICATED_PATCH | CUTANEOUS | Status: DC
  Administered 2014-05-31 – 2014-06-01 (×2): 1 via TRANSDERMAL
  Filled 2014-05-31 (×4): qty 1

## 2014-05-31 MED ORDER — PANTOPRAZOLE SODIUM 40 MG PO TBEC: 40.0000 mg | DELAYED_RELEASE_TABLET | Freq: Every day | ORAL | Status: DC | PRN

## 2014-05-31 MED ORDER — POLYETHYLENE GLYCOL 3350 17 G PO PACK
17.0000 g | PACK | Freq: Every day | ORAL | Status: DC
  Administered 2014-05-31 – 2014-06-02 (×3): 17 g via ORAL
  Filled 2014-05-31 (×3): qty 1

## 2014-05-31 MED ORDER — ACETAMINOPHEN 650 MG RE SUPP: 650.0000 mg | Freq: Four times a day (QID) | RECTAL | Status: DC | PRN

## 2014-05-31 MED ORDER — HYDROCODONE-ACETAMINOPHEN 5-325 MG PO TABS
1.0000 | ORAL_TABLET | ORAL | Status: DC | PRN
  Administered 2014-05-31 – 2014-06-02 (×5): 2 via ORAL
  Filled 2014-05-31 (×5): qty 2

## 2014-05-31 MED ORDER — SENNA 8.6 MG PO TABS
1.0000 | ORAL_TABLET | Freq: Two times a day (BID) | ORAL | Status: DC
  Administered 2014-05-31 – 2014-06-02 (×4): 8.6 mg via ORAL
  Filled 2014-05-31 (×4): qty 1

## 2014-05-31 MED ORDER — NEBIVOLOL HCL 2.5 MG PO TABS
5.0000 mg | ORAL_TABLET | Freq: Every day | ORAL | Status: DC
  Administered 2014-05-31 – 2014-06-02 (×3): 5 mg via ORAL
  Filled 2014-05-31 (×5): qty 2

## 2014-05-31 MED ORDER — LUBIPROSTONE 24 MCG PO CAPS
24.0000 ug | ORAL_CAPSULE | Freq: Two times a day (BID) | ORAL | Status: DC
  Administered 2014-05-31 – 2014-06-02 (×5): 24 ug via ORAL
  Filled 2014-05-31 (×8): qty 1

## 2014-05-31 MED ORDER — FENTANYL CITRATE 0.05 MG/ML IJ SOLN
50.0000 ug | Freq: Once | INTRAMUSCULAR | Status: AC
  Administered 2014-05-31: 50 ug via INTRAVENOUS
  Filled 2014-05-31: qty 2

## 2014-05-31 MED ORDER — ALUM & MAG HYDROXIDE-SIMETH 200-200-20 MG/5ML PO SUSP
30.0000 mL | Freq: Four times a day (QID) | ORAL | Status: DC | PRN
Start: 2014-05-31 — End: 2014-06-02

## 2014-05-31 MED ORDER — ABACAVIR SULFATE 300 MG PO TABS
600.0000 mg | ORAL_TABLET | Freq: Every day | ORAL | Status: DC
  Administered 2014-06-01 – 2014-06-02 (×2): 600 mg via ORAL
  Filled 2014-05-31 (×5): qty 2

## 2014-05-31 MED ORDER — DOLUTEGRAVIR SODIUM 50 MG PO TABS
50.0000 mg | ORAL_TABLET | Freq: Every day | ORAL | Status: DC
  Administered 2014-05-31 – 2014-06-02 (×3): 50 mg via ORAL
  Filled 2014-05-31 (×5): qty 1

## 2014-05-31 MED ORDER — SODIUM CHLORIDE 0.9 % IJ SOLN
3.0000 mL | Freq: Two times a day (BID) | INTRAMUSCULAR | Status: DC
  Administered 2014-05-31 – 2014-06-02 (×3): 3 mL via INTRAVENOUS

## 2014-05-31 MED ORDER — ALBUTEROL SULFATE (2.5 MG/3ML) 0.083% IN NEBU: 3.0000 mL | INHALATION_SOLUTION | Freq: Four times a day (QID) | RESPIRATORY_TRACT | Status: DC | PRN

## 2014-05-31 MED ORDER — DIPHENHYDRAMINE HCL 50 MG/ML IJ SOLN
25.0000 mg | Freq: Once | INTRAMUSCULAR | Status: AC
  Administered 2014-05-31: 25 mg via INTRAVENOUS
  Filled 2014-05-31: qty 1

## 2014-05-31 MED ORDER — ONDANSETRON HCL 4 MG/2ML IJ SOLN: 4.0000 mg | Freq: Four times a day (QID) | INTRAMUSCULAR | Status: DC | PRN

## 2014-05-31 MED ORDER — TRAZODONE HCL 50 MG PO TABS: 25.0000 mg | ORAL_TABLET | Freq: Every evening | ORAL | Status: DC | PRN

## 2014-05-31 MED ORDER — SODIUM CHLORIDE 0.9 % IV SOLN
INTRAVENOUS | Status: DC
  Administered 2014-05-31 – 2014-06-02 (×2): via INTRAVENOUS

## 2014-05-31 MED ORDER — ONDANSETRON HCL 4 MG/2ML IJ SOLN
4.0000 mg | Freq: Once | INTRAMUSCULAR | Status: AC
  Administered 2014-05-31: 4 mg via INTRAVENOUS
  Filled 2014-05-31: qty 2

## 2014-05-31 MED ORDER — LAMIVUDINE 150 MG PO TABS
150.0000 mg | ORAL_TABLET | Freq: Every day | ORAL | Status: DC
  Administered 2014-05-31 – 2014-06-02 (×3): 150 mg via ORAL
  Filled 2014-05-31 (×5): qty 1

## 2014-05-31 MED ORDER — LIDOCAINE 5 % EX PTCH
MEDICATED_PATCH | CUTANEOUS | Status: AC
  Filled 2014-05-31: qty 1

## 2014-05-31 NOTE — ED Notes (Signed)
Patient unable to tolerate sitting up due to pain in left side. EDP made aware.

## 2014-05-31 NOTE — H&P (Signed)
[facility] History and Physical  [name] [id number] DOB: 12-Oct-1941 DOA: 05/31/2014  Referring physician:  PCP: Octavio Graves, DO   Chief Complaint:   HPI: [name] is a 73 y.o. female past medical history HIV, hypertension, TIAs, anxiety, COPD percent to the emergency department with the chief complaint syncope. Permission obtained from the patient and her room in is at the bedside. She states she awakened this morning ambulated to the bathroom and her last memory is waking up on the bathroom floor. Include the room spinning a little before she got up and dizziness after she got up. He denies headache visual disturbances chest pain palpitations shortness of breath.  She remembers hollering out for her roommate. She felt immediate pain on her right side her neck. Roommate at the bedside confirms he heard patient fall and holler out. He found her on the floor. She was able to pull herself up with great difficulty. She denies any recent nausea vomiting diarrhea. She reports her last bowel movement was yesterday and normal in color and consistency.  Initial workup in the emergency department includes a CT of the abdomen and pelvis is unremarkable, CT of the cervical spine with no acute injury, CT of the chest with left ninth rib fracture, CT of the head without acute abnormality. Complete metabolic panel significant for creatinine of 1.46, complete blood count significant for an MCV of 102.6 otherwise unremarkable. ProBNP is 133. She is hemodynamically stable afebrile and not hypoxic. Unable to check orthostatics 2 to significant pain with mobility on that left side.  In the emergency department she received morphine, IV fluids and Zofran.  Review of Systems:  10 point review of systems completed and all systems are negative except as indicated in the history of present illness   Past Medical History  Diagnosis Date  . Bronchitis   . HIV (human immunodeficiency virus  infection)   . Hypertension   . History of TIAs 1981    left side weakness  . COPD (chronic obstructive pulmonary disease)   . Kidney disease related to HIV infection     pt states she has to have her creatinine level checked often   . Depression   . Anxiety   . Arthritis   . Peripheral neuropathy     bilateral feet   Past Surgical History  Procedure Laterality Date  . Abdominal hysterectomy  1985  . Back surgery    . Tubal ligation    . Esophagogastroduodenoscopy  08/10/2002      NUR: Mild changes of reflux esophagitis limited to gastroesophageal junction  No evidence of ring, stricture, or esophageal candidiasis dysphagia/ Gastritis, possibly H. pylori induced  . Colonoscopy  08/10/2002    NUR: Normal colonoscopy except for some hemorrhoids and some erythema at the dentate line  . Esophagogastroduodenoscopy  12/25/2002    NUR: Erosive antral gastritis.  The degree of gastritis is more significant than her last exam/ Normal examination of the esophagus/ Esophageal dilatation performed by passing 22 and [address] [place]  . Colonoscopy  12/10/2006    Edwards:Diffuse colitis in the transverse and descending colon/This is not entirely typical of ischemic colitis or her HIV positive/ disease.  We need to be concerned about other causes  . Esophagogastroduodenoscopy  11/13/2010    RMR: Circumferential distal esophageal erosions with soft peptic stricture, consistent with erosive reflux esophagitis or stricture  formation, status post dilation as described above/  Small hiatal hernia/ Tiny antral erosions, otherwise normal stomach D1  and D2  . Colonoscopy  01/19/2011    RMR: Internal and external hemorrhoids, likely source of hematochezia anal papilla, otherwise normal rectal mucosa/ Left-sided diverticula.  Cecal polyp with status post cold snare polypectomy.  Remainder of colonic mucosa appeared normal  . Cataract extraction, bilateral    . Hemorrhoid surgery  09/02/2012    Procedure:  HEMORRHOIDECTOMY;  Surgeon: Jamesetta So, MD;  Location: [facility];  Service: General;  Laterality: N/A;  . Cholecystectomy     Social History:  reports that she has never smoked. She has never used smokeless tobacco. She reports that she does not drink alcohol or use illicit drugs.  Allergies  Allergen Reactions  . Bee Venom Anaphylaxis  . Tenofovir Disoproxil Fumarate [Tenofovir Disoproxil Fumarate] Other (See Comments)    Renal failure, (08/27/2006)  . Codeine Itching  . Haloperidol Lactate Other (See Comments)    Reaction is muscle tension, causes severe spasms in face and neck. Forces eyes to roll in the back of the head  . Morphine Sulfate Itching  . Navane [Thiothixene] Other (See Comments)    Same muscle spasm reaction as haldol  . Prochlorperazine Edisylate Other (See Comments)    Same reaction as haldol   . Promethazine Hcl Other (See Comments)    Same as haldol   . Propoxyphene N-Acetaminophen Itching and Nausea And Vomiting    Family History  Problem Relation Age of Onset  . Diabetes Brother      Prior to Admission medications   Medication Sig Start Date End Date Taking? Authorizing Provider  abacavir (ZIAGEN) 300 MG tablet Take 600 mg by mouth daily.   Yes Historical Provider, MD  albuterol (PROVENTIL HFA;VENTOLIN HFA) 108 (90 BASE) MCG/ACT inhaler Inhale 2 puffs into the lungs every 6 (six) hours as needed. For shortness of breath   Yes Historical Provider, MD  amitriptyline (ELAVIL) 25 MG tablet Take 25 mg by mouth at bedtime.    Yes Historical Provider, MD  cholecalciferol (VITAMIN D) 400 UNITS TABS Take 400 Units by mouth daily.   Yes Historical Provider, MD  cycloSPORINE (RESTASIS) 0.05 % ophthalmic emulsion Place 1 drop into both eyes daily as needed (dry eyes).    Yes Historical Provider, MD  dolutegravir (TIVICAY) 50 MG tablet Take 50 mg by mouth daily.   Yes Historical Provider, MD  lamiVUDine (EPIVIR) 150 MG tablet Take 1 tablet (150 mg total) by mouth daily.  03/29/14  Yes Truman Hayward, MD  Linaclotide [facility]) 145 MCG CAPS capsule Take 145 mcg by mouth daily as needed (constipation).   Yes Historical Provider, MD  LORazepam (ATIVAN) 2 MG tablet Take 2 mg by mouth 2 (two) times daily as needed for anxiety.    Yes Historical Provider, MD  nebivolol (BYSTOLIC) 5 MG tablet Take 5 mg by mouth daily.     Yes Historical Provider, MD  pantoprazole (PROTONIX) 40 MG tablet Take 40 mg by mouth daily as needed (heartburn).   Yes Historical Provider, MD   Physical Exam: Filed Vitals:   05/31/14 0902 05/31/14 1030 05/31/14 1257 05/31/14 1330  BP: 161/81 134/59 138/63 131/63  Pulse: 73 74 72 74  Temp: 97.9 F (36.6 C)     Resp: 18 18 18 19   Height: 5\' 5"  (1.651 m)     Weight: 92.987 kg (205 lb)     SpO2: 96% 98% 96% 92%    Wt Readings from Last 3 Encounters:  05/31/14 92.987 kg (205 lb)  05/02/14 97.07 kg (214  lb)  03/21/14 96.163 kg (212 lb)    General:  Appears calm and comfortable Eyes: PERRL, normal lids, irises & left eye with subconjunctival hemorrage ENT: Is clear nose without drainage oropharynx without erythema or exudate mucous membranes of her mouth are pink but very dry Neck: no LAD, masses or thyromegaly Cardiovascular: RRR, no m/r/g. No LE edema.  Respiratory: CTA bilaterally, no w/r/r. Normal respiratory effort. Abdomen: soft, ntnd positive bowel Skin: no rash or induration seen on limited exam Musculoskeletal: grossly normal tone BUE/BLE Psychiatric: grossly normal mood and affect, speech fluent and appropriate Neurologic: grossly non-focal.          Labs on Admission:  Basic Metabolic Panel:  Recent Labs Lab 05/31/14 0933  NA 141  K 5.0  CL 104  CO2 27  GLUCOSE 114*  BUN 24*  CREATININE 1.46*  CALCIUM 9.3  MG 2.2   Liver Function Tests: No results found for this basename: AST, ALT, ALKPHOS, BILITOT, PROT, ALBUMIN,  in the last 168 hours No results found for this basename: LIPASE, AMYLASE,  in the last 168  hours No results found for this basename: AMMONIA,  in the last 168 hours CBC:  Recent Labs Lab 05/31/14 0933  WBC 5.0  NEUTROABS 2.5  HGB 14.9  HCT 43.2  MCV 102.6*  PLT 172   Cardiac Enzymes:  Recent Labs Lab 05/31/14 0933  TROPONINI <0.30    BNP (last 3 results)  Recent Labs  05/31/14 0933  PROBNP 133.5*   CBG: No results found for this basename: GLUCAP,  in the last 168 hours  Radiological Exams on Admission: Ct Abdomen Pelvis Wo Contrast  05/31/2014   CLINICAL DATA:  Left-sided abdominal pain.  The patient fell.  EXAM: CT ABDOMEN AND PELVIS WITHOUT CONTRAST  TECHNIQUE: Multidetector CT imaging of the abdomen and pelvis was performed following the standard protocol without IV contrast.  COMPARISON:  CT scan dated 08/27/2012  FINDINGS: There is no free air or free fluid in the abdomen. No intra-abdominal hemorrhage. There is chronic hepatomegaly with hepatic steatosis. Gallbladder has been removed. Biliary tree, spleen, pancreas, right adrenal gland and kidneys are normal. Stable angiomyolipoma of the left adrenal gland, 1.9 cm.  No dilated loops of bowel. There are few diverticula in the colon. Uterus is been removed. Ovaries and bladder are normal. No acute osseous abnormality. Previous lumbar decompression surgery.  IMPRESSION: No acute abnormality of the abdomen or pelvis. Hepatomegaly and hepatic steatosis.   Electronically Signed   By: Rozetta Nunnery M.D.   On: 05/31/2014 15:25   Ct Head Wo Contrast  05/31/2014   CLINICAL DATA:  Fall and neck pain.  EXAM: CT HEAD WITHOUT CONTRAST  CT CERVICAL SPINE WITHOUT CONTRAST  TECHNIQUE: Multidetector CT imaging of the head and cervical spine was performed following the standard protocol without intravenous contrast. Multiplanar CT image reconstructions of the cervical spine were also generated.  COMPARISON:  07/04/2010  FINDINGS: CT HEAD FINDINGS  There is a chronic area of encephalomalacia in the right basal ganglia and right white  matter tract. No evidence for acute hemorrhage, mass lesion, midline shift, hydrocephalus or large infarct. There is an old fracture involving the medial right orbital wall. The visualized paranasal sinuses are clear. No acute bone abnormality.  CT CERVICAL SPINE FINDINGS  Visualized mastoid air cells are clear. Lung apices are clear without a pneumothorax. No evidence for soft tissue swelling or edema. No suspicious lymphadenopathy. There is mild retrolisthesis at C5-C6 which is unchanged. No acute  bone abnormality in the cervical spine. Disc space narrowing at C5-C6 and C6-C7. There is medial deviation of the left carotid arteries.  IMPRESSION: No acute intracranial abnormality.  No acute bone abnormality in the cervical spine. Multilevel degenerative changes in the cervical spine.   Electronically Signed   By: Markus Daft M.D.   On: 05/31/2014 11:03   Ct Chest Wo Contrast  05/31/2014   CLINICAL DATA:  Fall, trauma  EXAM: CT CHEST, ABDOMEN AND PELVIS WITHOUT CONTRAST  TECHNIQUE: Multidetector CT imaging of the chest, abdomen and pelvis was performed following the standard protocol without IV contrast.  COMPARISON:  None.  FINDINGS: CT CHEST FINDINGS  No evidence of aortic injury on this noncontrast exam. No mediastinal hematoma. No pericardial fluid.  There is no pneumothorax or pleural fluid. There is mild bibasilar atelectasis.  Fracture of the left lower ninth rib which is minimally displaced. This is immediately adjacent to the spleen. Note of sternal fracture or scapular fracture or clavicle fracture  CT ABDOMEN AND PELVIS FINDINGS  No evidence of solid organ injury on this noncontrast exam. No free fluid surrounding the liver or spleen. There is bruising within the scope subcutaneous tissues along the left flank.  There is thickening of the left adrenal gland. Kidneys appear normal. The bowel is normal. There is a small umbilical hernia.  The bladder is intact. No free fluid the pelvis. Post hysterectomy.  No aggressive osseous lesion. No evidence of fracture pelvis or spine.  IMPRESSION: Chest Impression:  No evidence of thoracic trauma other than a rib fracture.  Abdomen / Pelvis Impression:  1. Solitary left ninth rib fracture. This fracture is immediately adjacent to the spleen; however, there is no evidence of splenic injury on this noncontrast exam. Lack of IV contrast limits sensitivity for solid organ injury.  2. Bruising along the left flank.   Electronically Signed   By: Suzy Bouchard M.D.   On: 05/31/2014 15:16   Ct Cervical Spine Wo Contrast  05/31/2014   CLINICAL DATA:  Fall and neck pain.  EXAM: CT HEAD WITHOUT CONTRAST  CT CERVICAL SPINE WITHOUT CONTRAST  TECHNIQUE: Multidetector CT imaging of the head and cervical spine was performed following the standard protocol without intravenous contrast. Multiplanar CT image reconstructions of the cervical spine were also generated.  COMPARISON:  07/04/2010  FINDINGS: CT HEAD FINDINGS  There is a chronic area of encephalomalacia in the right basal ganglia and right white matter tract. No evidence for acute hemorrhage, mass lesion, midline shift, hydrocephalus or large infarct. There is an old fracture involving the medial right orbital wall. The visualized paranasal sinuses are clear. No acute bone abnormality.  CT CERVICAL SPINE FINDINGS  Visualized mastoid air cells are clear. Lung apices are clear without a pneumothorax. No evidence for soft tissue swelling or edema. No suspicious lymphadenopathy. There is mild retrolisthesis at C5-C6 which is unchanged. No acute bone abnormality in the cervical spine. Disc space narrowing at C5-C6 and C6-C7. There is medial deviation of the left carotid arteries.  IMPRESSION: No acute intracranial abnormality.  No acute bone abnormality in the cervical spine. Multilevel degenerative changes in the cervical spine.   Electronically Signed   By: Markus Daft M.D.   On: 05/31/2014 11:03   Dg Chest Port 1 View  05/31/2014    CLINICAL DATA:  Chest pain.  EXAM: PORTABLE CHEST - 1 VIEW  COMPARISON:  Chest x-ray 07/14/2011 .  FINDINGS: Mediastinum and hilar structures are normal. Cardiomegaly with pulmonary vascular  prominence and interstitial prominence noted. Mild congestive heart failure may be present. Pneumonitis cannot be excluded. No pleural effusion or pneumothorax. Degenerative changes both shoulders.  IMPRESSION: 1. Cardiomegaly with very mild pulmonary venous congestion interstitial prominence. A component congestive heart failure cannot be excluded. 2. Mild pneumonitis cannot be excluded.   Electronically Signed   By: Marcello Moores  Register   On: 05/31/2014 13:36    EKG:   Assessment/Plan Principal Problem:   Syncope: ? actual loss of consciousness after interview. Will admit on telemetry. Will check orthostatics, EEG. Will hold diuretics. Urinalysis unremarkable chest the unremarkable doubt an infectious process we'll check a TSH urine drug screen.  Active Problems: Fractured left ninth rib. Related to fall. Will manage her pain with morphine and fentanyl patch. Explained to patient the importance of mobility and deep breathing in spite of pain. Will request incentive spirometry and encourage full lung expansion.  RENAL INSUFFICIENCY: Stage III. Current creatinine appears to be close to baseline according to the chart. Will provide gentle IV fluids. Hold any nephrotoxins. Recheck in the more    HIV DISEASE: recent CD count >900, nondetectable viral load per chart. Appears to be stable at baseline we'll continue home medication    HYPERLIPIDEMIA obtain a lipid panel continue home medication      HTN (hypertension): Controlled. Continue home    Myalgia: Stable    Code Status: full DVT Prophylaxis: Family Communication: significant other Disposition Plan: home when ready  Time spent: 29 minutes  [facility] Pager [phone]  **Disclaimer: This note may have been dictated with voice  recognition software. Similar sounding words can inadvertently be transcribed and this note may contain transcription errors which may not have been corrected upon publication of note.**

## 2014-05-31 NOTE — ED Notes (Signed)
Pt arrived back from MRI. PT to be transported to room shortly. Report given to [place], [place]

## 2014-05-31 NOTE — ED Notes (Signed)
Patient refused to stand.

## 2014-05-31 NOTE — ED Notes (Signed)
Patient brought in via EMS from home. Alert and oriented. Patient fell this morning in tub. Per patient hit head with possible LOC. Patient c/o headache, neck pain, mid-lower back pain, and left flank pain. Patient on LSB with c-collar in place. LSB removed by charge nurse while using log-rolling method.

## 2014-05-31 NOTE — Progress Notes (Signed)
Pt admitted to room 331 via stretcher from the ED. Pt alert and oriented x 4, able to follow commands and confirms left side pain rated 7/10. Standard hospital orientation completed. Bed in lowest position, call bell within reach will continue to monitor. Report received from [facility].

## 2014-05-31 NOTE — H&P (Signed)
Pt seen and examined with Ms. Renard Hamper. Agree with assessment and plan as outlined above. Briefly, pt presents syncopal episode while ambulating down hallway to void her bladder associated with dizziness with brief period of unresponsiveness, resulting in trauma to chest and difficulty standing afterwards. Hx to myself was that pt was on commode voiding her bladder when she became suddenly unresponsive for 15-29min. No bowel/bladder incontinence. CT head unremarkable. Orthostatics pending. MRI brain pending, 2d echo pending. EEG ordered, pending. Will follow up results.

## 2014-05-31 NOTE — ED Provider Notes (Signed)
This chart was scribed for [place], DO by Erling Conte, ED Scribe. This patient was seen in room APA06/APA06    TIME SEEN: 9:10 AM   CHIEF COMPLAINT: Chief Complaint  Patient presents with  . Fall  . Flank Pain  . Neck Pain  . Back Pain     HPI: [name] is a 73 y.o. female brought in by EMS with a h/o HIV (recent CD4 count greater than 900, nondetectable viral load), HTN, TIAs, COPD, chronic kidney disease, arthritis, and peripheral neuropathy who presents to the Emergency Department due to a fall that happened this morning. Patient states that this morning she felt dizziness, "blacked out" and fell while she was in the tub. Patient admits to LOC of consciousness and hitting her head. She states she is having associated HA, and constant, "aching" "8/10" neck pain, lower back pain, and left chest wall, abdominal and flank pain. Pain worse with palpation and movement. No alleviating factors. No radiation of pain. Patient has C-collar in place. Patient states she experienced shortness of breath after passing out, which has now resolved and also experienced some nausea yesterday. No chest pain or chest discomfort prior to syncopal episode. She denies being on any anticoagulants. She denies recent fever,  emesis or diarrhea, bloody stool or melena, new numbness, tingling or focal weakness.   PCP: Dr. Octavio Graves  ROS: See HPI Constitutional: no fever  Eyes: no drainage  ENT: no runny nose   Cardiovascular:  no chest pain  Resp: SOB present after LOC  GI: no vomiting or melena GU: no dysuria Integumentary: no rash  Allergy: no hives  Musculoskeletal: no leg swelling. Positive for neck pain, back pain, and left flank pain.  Neurological: no slurred speech, numbness, tingling or weakness. Positive for syncope, HA, and dizziness. ROS otherwise negative  PAST MEDICAL HISTORY/PAST SURGICAL HISTORY:  Past Medical History  Diagnosis Date  . Bronchitis   . HIV (human  immunodeficiency virus infection)   . Hypertension   . History of TIAs 1981    left side weakness  . COPD (chronic obstructive pulmonary disease)   . Kidney disease related to HIV infection     pt states she has to have her creatinine level checked often   . Depression   . Anxiety   . Arthritis   . Peripheral neuropathy     bilateral feet    MEDICATIONS:  Prior to Admission medications   Medication Sig Start Date End Date Taking? Authorizing Provider  Abacavir-Dolutegravir-Lamivud (TRIUMEQ) 600-50-300 MG TABS Take 1 tablet by mouth daily. 03/21/14   Truman Hayward, MD  acidophilus [facility]) CAPS Take 1 capsule by mouth daily.    Historical Provider, MD  albuterol (PROVENTIL HFA;VENTOLIN HFA) 108 (90 BASE) MCG/ACT inhaler Inhale 2 puffs into the lungs every 6 (six) hours as needed. For shortness of breath    Historical Provider, MD  amitriptyline (ELAVIL) 25 MG tablet Take 25 mg by mouth at bedtime.     Historical Provider, MD  cholecalciferol (VITAMIN D) 400 UNITS TABS Take 400 Units by mouth daily.    Historical Provider, MD  cycloSPORINE (RESTASIS) 0.05 % ophthalmic emulsion Place 1 drop into both eyes 2 (two) times daily.    Historical Provider, MD  docusate sodium (COLACE) 100 MG capsule Take 100 mg by mouth daily as needed. For constipation    Historical Provider, MD  gabapentin (NEURONTIN) 300 MG capsule Take 1 capsule (300 mg total) by mouth at bedtime.  05/03/14   Truman Hayward, MD  lamiVUDine (EPIVIR) 150 MG tablet Take 1 tablet (150 mg total) by mouth daily. 03/29/14   Truman Hayward, MD  LORazepam (ATIVAN) 2 MG tablet Take 2 mg by mouth 2 (two) times daily.      Historical Provider, MD  nebivolol (BYSTOLIC) 5 MG tablet Take 5 mg by mouth daily.      Historical Provider, MD  nitrofurantoin, macrocrystal-monohydrate, (MACROBID) 100 MG capsule Take 100 mg by mouth Daily.  07/27/12   Historical Provider, MD  oxyCODONE-acetaminophen (PERCOCET/ROXICET) 5-325 MG per tablet  Take 1 tablet by mouth every 4 (four) hours as needed. For pain 09/02/12   Jamesetta So, MD    ALLERGIES:  Allergies  Allergen Reactions  . Bee Venom Anaphylaxis  . Tenofovir Disoproxil Fumarate [Tenofovir Disoproxil Fumarate] Other (See Comments)    Renal failure, (08/27/2006)  . Codeine Itching  . Haloperidol Lactate Other (See Comments)    Reaction is muscle tension, causes severe spasms in face and neck. Forces eyes to roll in the back of the head  . Morphine Sulfate Itching  . Navane [Thiothixene] Other (See Comments)    Same muscle spasm reaction as haldol  . Prochlorperazine Edisylate Other (See Comments)    Same reaction as haldol   . Promethazine Hcl Other (See Comments)    Same as haldol   . Propoxyphene N-Acetaminophen Itching and Nausea And Vomiting    SOCIAL HISTORY:  History  Substance Use Topics  . Smoking status: Never Smoker   . Smokeless tobacco: Never Used  . Alcohol Use: No    FAMILY HISTORY: Family History  Problem Relation Age of Onset  . Diabetes Brother     EXAM: Triage Vitals: BP 161/81  Pulse 73  Temp(Src) 97.9 F (36.6 C)  Resp 18  Ht 5\' 5"  (1.651 m)  Wt 205 lb (92.987 kg)  BMI 34.11 kg/m2  SpO2 96%  CONSTITUTIONAL: Alert and oriented and responds appropriately to questions. Well-appearing; well-nourished; GCS 15 HEAD: Normocephalic; atraumatic EYES: Conjunctivae clear, PERRL, EOMI ENT: normal nose; no rhinorrhea; moist mucous membranes; pharynx without lesions noted; no dental injury; no septal hematoma NECK: Supple, no meningismus, no LAD; patient has diffuse cervical midline spinal tenderness without step-off or deformity CARD: RRR; S1 and S2 appreciated; no murmurs, no clicks, no rubs, no gallops RESP: Normal chest excursion without splinting or tachypnea; breath sounds clear and equal bilaterally; no wheezes, no rhonchi, no rales; chest wall stable, no crepitus or ecchymosis. Tender to palpation on left lateral chest wall. ABD/GI:  Normal bowel sounds; non-distended; soft,  no rebound, no guarding.  Diffusely tender on left side of abdomen PELVIS:  stable, nontender to palpation.  BACK:  The back appears normal, there is no CVA tenderness; no midline spinal tenderness or  step-offs or deformity EXT: Normal ROM in all joints; non-tender to palpation; no edema; normal capillary refill; no cyanosis    SKIN: Normal color for age and race; warm NEURO: Moves all extremities equally. Cranial nerves 2-12 intact. Sensation to light touch intact diffusely other than mild sensory deficit in her bilateral feet due to chronic neuropathy PSYCH: The patient's mood and manner are appropriate. Grooming and personal hygiene are appropriate.  MEDICAL DECISION MAKING:   9:21 AM- patient here with syncopal event and subsequent fall.  She denies preceding symptoms before her syncopal event other than feeling lightheaded. She felt short of breath after her syncopal event. She is complaining of headache, neck pain,  left chest and abdomen and flank pain after her fall. Will order diagnostic imaging to evaluate for any traumatic injury, labs to evaluate for possible anemia versus electrolyte abnormality versus ACS, urine to rule out infection, give pain medication.    ED PROGRESS: Patient's labs are unremarkable. Her creatinine is elevated at 1.46 but this is chronic. Urine shows no sign of infection. Troponin negative. CT of her head and cervical spine are negative. Cervical spine cleared clinically and C-collar removed. Unable to perform a CT of her chest and abdomen at this time because our CT scanner is not working. She still hemodynamically stable. Doubt significant injury. We'll obtain chest x-ray to evaluate for possible pneumothorax. I feel she will need admission for syncopal workup. I feel she is able to stay at [facility] hospital to have CT scans.    2:29 PM  Pt is still hemodynamically stable. Her chest x-ray shows interstitial edema consistent  with CHF. Her last echocardiogram on file isn't 2007. Still unable to perform CT of her abdomen and pelvis, chest at this time but have very low suspicion for any traumatic injury that needs surgical intervention. I still feel she likely needs these scans to further evaluate for her pain I feel she can be admitted to the hospital for syncopal workup. Discussed with Dr. Wyline Copas who agrees with this plan. We'll admit for observation, telemetry.    EKG Interpretation  Date/Time:  Thursday May 31 2014 09:25:27 EDT Ventricular Rate:  71 PR Interval:  172 QRS Duration: 94 QT Interval:  437 QTC Calculation: 475 R Axis:   32 Text Interpretation:  Sinus rhythm Low voltage, precordial leads Borderline T wave abnormalities No significant change since last tracing in 2013 Baseline wander in lead(s) V6 Confirmed by Lindamarie Maclachlan,  DO, Tra[place]on Morrical [id number]) on 05/31/2014 9:33:25 AM        I personally performed the services described in this documentation, which was scribed in my presence. The recorded information has been reviewed and is accurate.       [place], DO 05/31/14 1430

## 2014-06-01 ENCOUNTER — Other Ambulatory Visit (HOSPITAL_COMMUNITY): Payer: Medicare Other

## 2014-06-01 DIAGNOSIS — S0990XA Unspecified injury of head, initial encounter: Secondary | ICD-10-CM | POA: Diagnosis not present

## 2014-06-01 DIAGNOSIS — I517 Cardiomegaly: Secondary | ICD-10-CM

## 2014-06-01 DIAGNOSIS — B20 Human immunodeficiency virus [HIV] disease: Secondary | ICD-10-CM

## 2014-06-01 LAB — BASIC METABOLIC PANEL
Anion gap: 10 (ref 5–15)
BUN: 21 mg/dL (ref 6–23)
CALCIUM: 9.1 mg/dL (ref 8.4–10.5)
CO2: 28 meq/L (ref 19–32)
CREATININE: 1.48 mg/dL — AB (ref 0.50–1.10)
Chloride: 103 mEq/L (ref 96–112)
GFR calc Af Amer: 39 mL/min — ABNORMAL LOW (ref >90)
GFR calc non Af Amer: 34 mL/min — ABNORMAL LOW (ref >90)
GLUCOSE: 113 mg/dL — AB (ref 70–99)
Potassium: 4.7 mEq/L (ref 3.7–5.3)
Sodium: 141 mEq/L (ref 137–147)

## 2014-06-01 LAB — TSH: TSH: 3.65 u[IU]/mL (ref 0.350–4.500)

## 2014-06-01 MED ORDER — PNEUMOCOCCAL VAC POLYVALENT 25 MCG/0.5ML IJ INJ
0.5000 mL | INJECTION | INTRAMUSCULAR | Status: AC
  Administered 2014-06-02: 0.5 mL via INTRAMUSCULAR
  Filled 2014-06-01: qty 0.5

## 2014-06-01 NOTE — Care Management Note (Addendum)
    Page 1 of 1   06/05/2014     2:57:47 PM CARE MANAGEMENT NOTE 06/05/2014  Patient:  [name], [name]   Account Number:  [id number]  Date Initiated:  06/01/2014  Documentation initiated by:  Jolene Provost  Subjective/Objective Assessment:   Patient is from home, lives with a friend who is available to help her with ADL's as needed after discharge. Patient has a Banker and cane at home that she has gotten from [place]. in the past. Pt has neb machine from [employer].     Action/Plan:   patient plans to discharge home with self care and assistance from roomate. Patient ordered [facility] through [facility] (per pt's request). No CM needs at this time, will continue to follow for CM needs.   Anticipated DC Date:     Anticipated DC Plan:  HOME/SELF CARE      DC Planning Services  CM consult      Choice offered to / List presented to:  C-1 Patient   DME arranged  [facility]      DME agency  [facility]        Status of service:  Completed, signed off Medicare Important Message given?   (If response is "NO", the following Medicare IM given date fields will be blank) Date Medicare IM given:   Medicare IM given by:   Date Additional Medicare IM given:   Additional Medicare IM given by:    Discharge Disposition:  HOME/SELF CARE  Per UR Regulation:    If discussed at Long Length of Stay Meetings, dates discussed:    Comments:  06/04/2014 [place], RN, MSN, PCCN PT recommendations sent to pt's PCP and will be ordered by pt's PCP. Patient called and notified of pending [place] PT orders.  06/01/2014 [place], RN, MSN, [facility]

## 2014-06-01 NOTE — Progress Notes (Signed)
UR completed 

## 2014-06-01 NOTE — Evaluation (Signed)
Physical Therapy Evaluation Patient Details Name: [name] MRN: [id number] DOB: 25-Jul-1941 Today's Date: 06/01/2014   History of Present Illness  [name] is a 73 y.o. female past medical history HIV, hypertension, TIAs, anxiety, COPD percent to the emergency department with the chief complaint syncope. Permission obtained from the patient and her room in is at the bedside. She states she awakened this morning ambulated to the bathroom and her last memory is waking up on the bathroom floor. Include the room spinning a little before she got up and dizziness after she got up. He denies headache visual disturbances chest pain palpitations shortness of breath. She remembers hollering out for her roommate. She felt immediate pain on her right side her neck. Roommate at the bedside confirms he heard patient fall and holler out. He found her on the floor. She was able to pull herself up with great difficulty. She denies any recent nausea vomiting diarrhea. She reports her last bowel movement was yesterday and normal in color and consistency. Initial workup in the emergency department includes a CT of the abdomen and pelvis is unremarkable, CT of the cervical spine with no acute injury, CT of the chest with left ninth rib fracture, CT of the head without acute abnormality. Complete metabolic panel significant for creatinine of 1.46, complete blood count significant for an MCV of 102.6 otherwise unremarkable. ProBNP is 133. She is hemodynamically stable afebrile and not hypoxic. Unable to check orthostatics 2 to significant pain with mobility on that left side. Fractured left ninth rib. Related to fall. Will manage her pain with morphine and fentanyl patch. Explained to patient the importance of mobility and deep breathing in spite of pain. Will request incentive spirometry and encourage full lung expansion.  Patient is from home, lives with a friend who is available to help her with ADL's as needed after  discharge. Patient has a walker and cane at home that she has gotten from [place]  Patient displays only mild dizziness with change from sitting to standing and with walking. Expect patient to be discharged home with Home health PT to improve patient's balance. Patient will have 24 hour assistance upon discharge. Patient will continue to have physical therapy during current hospital stay to increase balance and strength.     Follow Up Recommendations Home health PT    Equipment Recommendations  None recommended by PT    Recommendations for Other Services       Precautions / Restrictions Precautions Precautions: Fall Precaution Comments: Dizziness/syncope Restrictions Weight Bearing Restrictions: No      Mobility  Bed Mobility Overal bed mobility: Needs Assistance Bed Mobility: Supine to Sit     Supine to sit: Modified independent (Device/Increase time)     General bed mobility comments: assistance required for sequencing into hospital bed   Transfers Overall transfer level: Modified independent   Transfers: Sit to/from Stand Sit to Stand: Modified independent (Device/Increase time)            Ambulation/Gait Ambulation/Gait assistance: Modified independent (Device/Increase time) Ambulation Distance (Feet): 40 Feet Assistive device: Standard walker Gait Pattern/deviations: WFL(Within Functional Limits)   Gait velocity interpretation: <1.8 ft/sec, indicative of risk for recurrent falls      Balance Overall balance assessment: Needs assistance Sitting-balance support: No upper extremity supported       Standing balance support: Single extremity supported Standing balance-Leahy Scale: Fair  Pertinent Vitals/Pain Pain Assessment: 0-10 Pain Score: 5  Pain Location: 9th rib/chest/back Pain Descriptors / Indicators: Aching;Sharp Pain Intervention(s): Limited activity within patient's  tolerance    Home Living Family/patient expects to be discharged to:: Private residence Living Arrangements: Non-relatives/Friends Available Help at Discharge: Friend(s) Type of Home: Apartment Home Access: Level entry     Home Layout: One level Home Equipment: Environmental consultant - 2 wheels;Cane - quad               Extremity/Trunk Assessment               Lower Extremity Assessment: Generalized weakness            Cognition Arousal/Alertness: Awake/alert Behavior During Therapy: WFL for tasks assessed/performed Overall Cognitive Status: Within Functional Limits for tasks assessed                               Assessment/Plan    PT Assessment Patient needs continued PT services  PT Diagnosis Difficulty walking   PT Problem List Decreased strength;Decreased activity tolerance;Decreased balance;Decreased mobility;Pain  PT Treatment Interventions Therapeutic activities;Therapeutic exercise;Gait training;Balance training;Patient/family education   PT Goals (Current goals can be found in the Care Plan section) Acute Rehab PT Goals Patient Stated Goal: to go home PT Goal Formulation: With patient Time For Goal Achievement: 06/08/14 Potential to Achieve Goals: Good    Frequency Min 3X/week   Barriers to discharge   patient WILL have 24 hour assistance with discharge home       End of Session   Activity Tolerance: Patient tolerated treatment well Patient left: in bed;with call bell/phone within reach;with bed alarm set Nurse Communication: Mobility status    Functional Assessment Tool Used: FOTO Functional Limitation: Changing and maintaining body position Changing and Maintaining Body Position Current Status [id number]): At least 20 percent but less than 40 percent impaired, limited or restricted Changing and Maintaining Body Position Goal Status [id number]): At least 1 percent but less than 20 percent impaired, limited or restricted    Time: [id number] PT  Time Calculation (min): 24 min   Charges:   PT Evaluation $Initial PT Evaluation Tier I: 1 Procedure PT Treatments $Gait Training: 8-22 mins $Therapeutic Activity: 8-22 mins   PT G Codes:   Functional Assessment Tool Used: FOTO Functional Limitation: Changing and maintaining body position    Ragan Reale R 06/01/2014, 5:02 PM

## 2014-06-01 NOTE — Progress Notes (Signed)
*  PRELIMINARY RESULTS* Echocardiogram 2D Echocardiogram has been performed.  Leavy Cella 06/01/2014, 1:53 PM

## 2014-06-01 NOTE — Progress Notes (Signed)
[facility] PROGRESS NOTE  [name] [id number] DOB: 1941-10-22 DOA: 05/31/2014 PCP: Octavio Graves, DO  Assessment/Plan: *Principal Problem:  Syncope: uncertain if loss of consciousness occurred given information from patient changed frequently. Urinalysis unremarkable chest the unremarkable doubt an infectious No events on tele. UDS unremarkable. TSH within the limits of normal. Unable to evaluate orthostatic BP as patient unwilling to move to that degree due to fx rib. Await echo and EEG. Have decreased IV as po intake good. Will request PT assistance. Active Problems:  Fractured left ninth rib. Related to fall. Difficult to  manage her pain. Continue  morphine and fentanyl patch. Explained to patient the importance of mobility and deep breathing in spite of pain. Will request incentive spirometry and encourage full lung expansion.   RENAL INSUFFICIENCY: Stage III. Appears to stable at baseline. Decrease  IV fluids. Hold any nephrotoxins. Recheck in the more   HIV DISEASE: recent CD count >900, nondetectable viral load per chart. Appears to be stable at baseline we'll continue home medication   HYPERLIPIDEMIA  continue home medication   HTN (hypertension): Controlled. Continue home   Myalgia: Stable  Code Status: full Family Communication:  Disposition Plan: home hopefully tomorrow   Consultants:  none  Procedures:  none  Antibiotics:  none  HPI/Subjective: Awake alert complaining left side pain  Objective: Filed Vitals:   06/01/14 0516  BP: 142/73  Pulse: 76  Temp: 98.4 F (36.9 C)  Resp: 18    Intake/Output Summary (Last 24 hours) at 06/01/14 1243 Last data filed at 06/01/14 0945  Gross per 24 hour  Intake    975 ml  Output      0 ml  Net    975 ml   Filed Weights   05/31/14 0902 05/31/14 1808  Weight: 92.987 kg (205 lb) 98.657 kg (217 lb 8 oz)    Exam:   General:  Obese NAD  Cardiovascular: RRR No MGR No LE edema  Respiratory:  normal effort BS clear bilaterally no wheeze  Abdomen: obese soft +BS non-tender to palpation  Musculoskeletal: no clubbing or cyanosis   Data Reviewed: Basic Metabolic Panel:  Recent Labs Lab 05/31/14 0933 06/01/14 0559  NA 141 141  K 5.0 4.7  CL 104 103  CO2 27 28  GLUCOSE 114* 113*  BUN 24* 21  CREATININE 1.46* 1.48*  CALCIUM 9.3 9.1  MG 2.2  --    Liver Function Tests: No results found for this basename: AST, ALT, ALKPHOS, BILITOT, PROT, ALBUMIN,  in the last 168 hours No results found for this basename: LIPASE, AMYLASE,  in the last 168 hours No results found for this basename: AMMONIA,  in the last 168 hours CBC:  Recent Labs Lab 05/31/14 0933  WBC 5.0  NEUTROABS 2.5  HGB 14.9  HCT 43.2  MCV 102.6*  PLT 172   Cardiac Enzymes:  Recent Labs Lab 05/31/14 0933  TROPONINI <0.30   BNP (last 3 results)  Recent Labs  05/31/14 0933  PROBNP 133.5*   CBG: No results found for this basename: GLUCAP,  in the last 168 hours  No results found for this or any previous visit (from the past 240 hour(s)).   Studies: Ct Abdomen Pelvis Wo Contrast  05/31/2014   CLINICAL DATA:  Left-sided abdominal pain.  The patient fell.  EXAM: CT ABDOMEN AND PELVIS WITHOUT CONTRAST  TECHNIQUE: Multidetector CT imaging of the abdomen and pelvis was performed following the standard protocol without IV contrast.  COMPARISON:  CT  scan dated 08/27/2012  FINDINGS: There is no free air or free fluid in the abdomen. No intra-abdominal hemorrhage. There is chronic hepatomegaly with hepatic steatosis. Gallbladder has been removed. Biliary tree, spleen, pancreas, right adrenal gland and kidneys are normal. Stable angiomyolipoma of the left adrenal gland, 1.9 cm.  No dilated loops of bowel. There are few diverticula in the colon. Uterus is been removed. Ovaries and bladder are normal. No acute osseous abnormality. Previous lumbar decompression surgery.  IMPRESSION: No acute abnormality of the  abdomen or pelvis. Hepatomegaly and hepatic steatosis.   Electronically Signed   By: Rozetta Nunnery M.D.   On: 05/31/2014 15:25   Ct Head Wo Contrast  05/31/2014   CLINICAL DATA:  Fall and neck pain.  EXAM: CT HEAD WITHOUT CONTRAST  CT CERVICAL SPINE WITHOUT CONTRAST  TECHNIQUE: Multidetector CT imaging of the head and cervical spine was performed following the standard protocol without intravenous contrast. Multiplanar CT image reconstructions of the cervical spine were also generated.  COMPARISON:  07/04/2010  FINDINGS: CT HEAD FINDINGS  There is a chronic area of encephalomalacia in the right basal ganglia and right white matter tract. No evidence for acute hemorrhage, mass lesion, midline shift, hydrocephalus or large infarct. There is an old fracture involving the medial right orbital wall. The visualized paranasal sinuses are clear. No acute bone abnormality.  CT CERVICAL SPINE FINDINGS  Visualized mastoid air cells are clear. Lung apices are clear without a pneumothorax. No evidence for soft tissue swelling or edema. No suspicious lymphadenopathy. There is mild retrolisthesis at C5-C6 which is unchanged. No acute bone abnormality in the cervical spine. Disc space narrowing at C5-C6 and C6-C7. There is medial deviation of the left carotid arteries.  IMPRESSION: No acute intracranial abnormality.  No acute bone abnormality in the cervical spine. Multilevel degenerative changes in the cervical spine.   Electronically Signed   By: Markus Daft M.D.   On: 05/31/2014 11:03   Ct Chest Wo Contrast  05/31/2014   CLINICAL DATA:  Fall, trauma  EXAM: CT CHEST, ABDOMEN AND PELVIS WITHOUT CONTRAST  TECHNIQUE: Multidetector CT imaging of the chest, abdomen and pelvis was performed following the standard protocol without IV contrast.  COMPARISON:  None.  FINDINGS: CT CHEST FINDINGS  No evidence of aortic injury on this noncontrast exam. No mediastinal hematoma. No pericardial fluid.  There is no pneumothorax or pleural  fluid. There is mild bibasilar atelectasis.  Fracture of the left lower ninth rib which is minimally displaced. This is immediately adjacent to the spleen. Note of sternal fracture or scapular fracture or clavicle fracture  CT ABDOMEN AND PELVIS FINDINGS  No evidence of solid organ injury on this noncontrast exam. No free fluid surrounding the liver or spleen. There is bruising within the scope subcutaneous tissues along the left flank.  There is thickening of the left adrenal gland. Kidneys appear normal. The bowel is normal. There is a small umbilical hernia.  The bladder is intact. No free fluid the pelvis. Post hysterectomy. No aggressive osseous lesion. No evidence of fracture pelvis or spine.  IMPRESSION: Chest Impression:  No evidence of thoracic trauma other than a rib fracture.  Abdomen / Pelvis Impression:  1. Solitary left ninth rib fracture. This fracture is immediately adjacent to the spleen; however, there is no evidence of splenic injury on this noncontrast exam. Lack of IV contrast limits sensitivity for solid organ injury.  2. Bruising along the left flank.   Electronically Signed   By: Nicole Kindred  Leonia Reeves M.D.   On: 05/31/2014 15:16   Ct Cervical Spine Wo Contrast  05/31/2014   CLINICAL DATA:  Fall and neck pain.  EXAM: CT HEAD WITHOUT CONTRAST  CT CERVICAL SPINE WITHOUT CONTRAST  TECHNIQUE: Multidetector CT imaging of the head and cervical spine was performed following the standard protocol without intravenous contrast. Multiplanar CT image reconstructions of the cervical spine were also generated.  COMPARISON:  07/04/2010  FINDINGS: CT HEAD FINDINGS  There is a chronic area of encephalomalacia in the right basal ganglia and right white matter tract. No evidence for acute hemorrhage, mass lesion, midline shift, hydrocephalus or large infarct. There is an old fracture involving the medial right orbital wall. The visualized paranasal sinuses are clear. No acute bone abnormality.  CT CERVICAL SPINE  FINDINGS  Visualized mastoid air cells are clear. Lung apices are clear without a pneumothorax. No evidence for soft tissue swelling or edema. No suspicious lymphadenopathy. There is mild retrolisthesis at C5-C6 which is unchanged. No acute bone abnormality in the cervical spine. Disc space narrowing at C5-C6 and C6-C7. There is medial deviation of the left carotid arteries.  IMPRESSION: No acute intracranial abnormality.  No acute bone abnormality in the cervical spine. Multilevel degenerative changes in the cervical spine.   Electronically Signed   By: Markus Daft M.D.   On: 05/31/2014 11:03   Mr Brain Wo Contrast  05/31/2014   CLINICAL DATA:  73 year old female with syncope, fall, head injury. Initial encounter. HIV.  EXAM: MRI HEAD WITHOUT CONTRAST  TECHNIQUE: Multiplanar, multiecho pulse sequences of the brain and surrounding structures were obtained without intravenous contrast.  COMPARISON:  Head CTs without contrast from 1003 hr today and earlier.  FINDINGS: Cerebral volume is not significantly changed since 2007. Likewise, stable chronic lacunar infarct of the right basal ganglia and corona radiata with hemosiderin.  Major intracranial vascular flow voids are preserved, with diminutive vertebrobasilar junction on the basis of fetal PCA origins. There is a degree of anterior circulation dolichoectasia.  No restricted diffusion to suggest acute infarction. No midline shift, mass effect, evidence of mass lesion, ventriculomegaly, extra-axial collection or acute intracranial hemorrhage. Cervicomedullary junction and pituitary are within normal limits. Negative visualized cervical spine. Outside of the post ischemic changes in the right hemisphere, gray and white matter signal is within normal limits for age.  Visible internal auditory structures appear normal. Visualized paranasal sinuses and mastoids are clear. Postoperative changes to the globes. Visualized scalp soft tissues are within normal limits.  Visualized bone marrow signal is within normal limits.  IMPRESSION: 1.  No acute intracranial abnormality. 2. Chronic right basal ganglia infarct.   Electronically Signed   By: Lars Pinks M.D.   On: 05/31/2014 16:43   Dg Chest Port 1 View  05/31/2014   CLINICAL DATA:  Chest pain.  EXAM: PORTABLE CHEST - 1 VIEW  COMPARISON:  Chest x-ray 07/14/2011 .  FINDINGS: Mediastinum and hilar structures are normal. Cardiomegaly with pulmonary vascular prominence and interstitial prominence noted. Mild congestive heart failure may be present. Pneumonitis cannot be excluded. No pleural effusion or pneumothorax. Degenerative changes both shoulders.  IMPRESSION: 1. Cardiomegaly with very mild pulmonary venous congestion interstitial prominence. A component congestive heart failure cannot be excluded. 2. Mild pneumonitis cannot be excluded.   Electronically Signed   By: Marcello Moores  Register   On: 05/31/2014 13:36    Scheduled Meds: . abacavir  600 mg Oral Daily  . docusate sodium  100 mg Oral BID  . dolutegravir  50 mg  Oral Daily  . enoxaparin (LOVENOX) injection  40 mg Subcutaneous Q24H  . lamiVUDine  150 mg Oral Daily  . lidocaine  1 patch Transdermal Q24H  . lubiprostone  24 mcg Oral BID WC  . nebivolol  5 mg Oral Daily  . polyethylene glycol  17 g Oral Daily  . senna  1 tablet Oral BID  . sodium chloride  3 mL Intravenous Q12H   Continuous Infusions: . sodium chloride 75 mL/hr at 05/31/14 1841    Principal Problem:   Syncope Active Problems:   HIV DISEASE   HYPERLIPIDEMIA   RENAL INSUFFICIENCY   HTN (hypertension)   Myalgia   Rib fracture   Syncope and collapse    Time spent: [facility] Pager [phone]. If 7PM-7AM, please contact night-coverage at [email or web address], password [facility] 06/01/2014, 12:43 PM  LOS: 1 day

## 2014-06-01 NOTE — Progress Notes (Signed)
Pt seen and examined. Agree with assessment and plan per Ms. Black. Briefly, pt presents with possible syncope with work up under way. Pt also noted to have resultant L sided rib fx. Cont with supportive care. Await orthostatics. Cont IVF for now.

## 2014-06-02 DIAGNOSIS — R252 Cramp and spasm: Secondary | ICD-10-CM

## 2014-06-02 DIAGNOSIS — S0990XA Unspecified injury of head, initial encounter: Secondary | ICD-10-CM | POA: Diagnosis not present

## 2014-06-02 DIAGNOSIS — I951 Orthostatic hypotension: Secondary | ICD-10-CM

## 2014-06-02 LAB — BASIC METABOLIC PANEL
Anion gap: 13 (ref 5–15)
BUN: 18 mg/dL (ref 6–23)
CHLORIDE: 104 meq/L (ref 96–112)
CO2: 24 meq/L (ref 19–32)
CREATININE: 1.38 mg/dL — AB (ref 0.50–1.10)
Calcium: 9.3 mg/dL (ref 8.4–10.5)
GFR calc Af Amer: 43 mL/min — ABNORMAL LOW (ref >90)
GFR calc non Af Amer: 37 mL/min — ABNORMAL LOW (ref >90)
GLUCOSE: 148 mg/dL — AB (ref 70–99)
Potassium: 4.5 mEq/L (ref 3.7–5.3)
Sodium: 141 mEq/L (ref 137–147)

## 2014-06-02 MED ORDER — HYDROCODONE-ACETAMINOPHEN 5-325 MG PO TABS: 1.0000 | ORAL_TABLET | ORAL | Status: DC | PRN

## 2014-06-02 NOTE — Discharge Instructions (Signed)
Rib Fracture A rib fracture is a break or crack in one of the bones of the ribs. The ribs are like a cage that goes around your upper chest. A broken or cracked rib is often painful, but most do not cause other problems. Most rib fractures heal on their own in 1-3 months. HOME CARE  Avoid activities that cause pain to the injured area. Protect your injured area.  Slowly increase activity as told by your doctor.  Take medicine as told by your doctor.  Put ice on the injured area for the first 1-2 days after you have been treated or as told by your doctor.  Put ice in a plastic bag.  Place a towel between your skin and the bag.  Leave the ice on for 15-20 minutes at a time, every 2 hours while you are awake.  Do deep breathing as told by your doctor. You may be told to:  Take deep breaths many times a day.  Cough many times a day while hugging a pillow.  Use a device (incentive spirometer) to perform deep breathing many times a day.  Drink enough fluids to keep your pee (urine) clear or pale yellow.   Do not wear a rib belt or binder. These do not allow you to breathe deeply. GET HELP RIGHT AWAY IF:   You have a fever.  You have trouble breathing.   You cannot stop coughing.  You cough up thick or bloody spit (mucus).   You feel sick to your stomach (nauseous), throw up (vomit), or have belly (abdominal) pain.   Your pain gets worse and medicine does not help.  MAKE SURE YOU:   Understand these instructions.  Will watch your condition.  Will get help right away if you are not doing well or get worse. Document Released: 07/21/2008 Document Revised: 02/06/2013 Document Reviewed: 12/14/2012 [facility] Patient Information 2015 [place], [place]. This information is not intended to replace advice given to you by your health care provider. Make sure you discuss any questions you have with your health care provider.

## 2014-06-02 NOTE — Discharge Summary (Signed)
Physician Discharge Summary  [name] [id number] DOB: 12-04-1940 DOA: 05/31/2014  PCP: Octavio Graves, DO  Admit date: 05/31/2014 Discharge date: 06/02/2014  Time spent: 35 minutes  Recommendations for Outpatient Follow-up:  1. Follow up with PCP in 1-2 weeks  Discharge Diagnoses:  Principal Problem:   Syncope Active Problems:   HIV DISEASE   HYPERLIPIDEMIA   RENAL INSUFFICIENCY   HTN (hypertension)   Myalgia   Rib fracture   Syncope and collapse   Discharge Condition: Stable  Diet recommendation: Regular  Filed Weights   05/31/14 0902 05/31/14 1808  Weight: 92.987 kg (205 lb) 98.657 kg (217 lb 8 oz)    History of present illness:  Please admit h and p from 8/6 for details. Briefly, pt presents with questionable syncopal episode, which by history seems to be more of a fall related to orthostasis (sbp change from 157 to 127 from lying to sitting).  Hospital Course:  The patient was admitted to the floor. She was continued on basal IVF with improvement of overall symptoms. An MRI was found to be unremarkable for acute process. 2D echo was unremarkable. Of note, the patient was found to have a L sided rib fracture. She tolerated pain meds well. The patient was seen by PT with recommendations for home health. The patient is to follow up with her PCP in 1-2 weeks for close outpatient follow up .  Discharge Exam: Filed Vitals:   06/01/14 0516 06/01/14 1355 06/01/14 2108 06/02/14 0500  BP: 142/73  150/64   Pulse: 76  64   Temp: 98.4 F (36.9 C) 99 F (37.2 C) 99 F (37.2 C) 98.4 F (36.9 C)  TempSrc: Oral Oral Oral Oral  Resp: 18  20 17   Height:      Weight:      SpO2: 95% 91% 96% 96%    General: Awake, in nad Cardiovascular: regular, s1, s2 Respiratory: normal resp effort, no wheezing  Discharge Instructions     Medication List         abacavir 300 MG tablet  Commonly known as:  ZIAGEN  Take 600 mg by mouth daily.     albuterol 108 (90 BASE)  MCG/ACT inhaler  Commonly known as:  PROVENTIL HFA;VENTOLIN HFA  Inhale 2 puffs into the lungs every 6 (six) hours as needed. For shortness of breath     amitriptyline 25 MG tablet  Commonly known as:  ELAVIL  Take 25 mg by mouth at bedtime.     cholecalciferol 400 UNITS Tabs tablet  Commonly known as:  VITAMIN D  Take 400 Units by mouth daily.     cycloSPORINE 0.05 % ophthalmic emulsion  Commonly known as:  RESTASIS  Place 1 drop into both eyes daily as needed (dry eyes).     HYDROcodone-acetaminophen 5-325 MG per tablet  Commonly known as:  NORCO/VICODIN  Take 1-2 tablets by mouth every 4 (four) hours as needed for moderate pain.     lamiVUDine 150 MG tablet  Commonly known as:  EPIVIR  Take 1 tablet (150 mg total) by mouth daily.     LINZESS 145 MCG Caps capsule  Generic drug:  Linaclotide  Take 145 mcg by mouth daily as needed (constipation).     LORazepam 2 MG tablet  Commonly known as:  ATIVAN  Take 2 mg by mouth 2 (two) times daily as needed for anxiety.     nebivolol 5 MG tablet  Commonly known as:  BYSTOLIC  Take 5 mg by  mouth daily.     pantoprazole 40 MG tablet  Commonly known as:  PROTONIX  Take 40 mg by mouth daily as needed (heartburn).     TIVICAY 50 MG tablet  Generic drug:  dolutegravir  Take 50 mg by mouth daily.       Allergies  Allergen Reactions  . Bee Venom Anaphylaxis  . Tenofovir Disoproxil Fumarate [Tenofovir Disoproxil Fumarate] Other (See Comments)    Renal failure, (08/27/2006)  . Codeine Itching  . Haloperidol Lactate Other (See Comments)    Reaction is muscle tension, causes severe spasms in face and neck. Forces eyes to roll in the back of the head  . Morphine Sulfate Itching  . Navane [Thiothixene] Other (See Comments)    Same muscle spasm reaction as haldol  . Prochlorperazine Edisylate Other (See Comments)    Same reaction as haldol   . Promethazine Hcl Other (See Comments)    Same as haldol   . Propoxyphene N-Acetaminophen  Itching and Nausea And Vomiting   Follow-up Information   Follow up with [facility], Thompson, DO. Schedule an appointment as soon as possible for a visit in 1 week.   Contact information:   [address] [place] [address] [phone]        The results of significant diagnostics from this hospitalization (including imaging, microbiology, ancillary and laboratory) are listed below for reference.    Significant Diagnostic Studies: Ct Abdomen Pelvis Wo Contrast  05/31/2014   CLINICAL DATA:  Left-sided abdominal pain.  The patient fell.  EXAM: CT ABDOMEN AND PELVIS WITHOUT CONTRAST  TECHNIQUE: Multidetector CT imaging of the abdomen and pelvis was performed following the standard protocol without IV contrast.  COMPARISON:  CT scan dated 08/27/2012  FINDINGS: There is no free air or free fluid in the abdomen. No intra-abdominal hemorrhage. There is chronic hepatomegaly with hepatic steatosis. Gallbladder has been removed. Biliary tree, spleen, pancreas, right adrenal gland and kidneys are normal. Stable angiomyolipoma of the left adrenal gland, 1.9 cm.  No dilated loops of bowel. There are few diverticula in the colon. Uterus is been removed. Ovaries and bladder are normal. No acute osseous abnormality. Previous lumbar decompression surgery.  IMPRESSION: No acute abnormality of the abdomen or pelvis. Hepatomegaly and hepatic steatosis.   Electronically Signed   By: Rozetta Nunnery M.D.   On: 05/31/2014 15:25   Ct Head Wo Contrast  05/31/2014   CLINICAL DATA:  Fall and neck pain.  EXAM: CT HEAD WITHOUT CONTRAST  CT CERVICAL SPINE WITHOUT CONTRAST  TECHNIQUE: Multidetector CT imaging of the head and cervical spine was performed following the standard protocol without intravenous contrast. Multiplanar CT image reconstructions of the cervical spine were also generated.  COMPARISON:  07/04/2010  FINDINGS: CT HEAD FINDINGS  There is a chronic area of encephalomalacia in the right basal ganglia and right white  matter tract. No evidence for acute hemorrhage, mass lesion, midline shift, hydrocephalus or large infarct. There is an old fracture involving the medial right orbital wall. The visualized paranasal sinuses are clear. No acute bone abnormality.  CT CERVICAL SPINE FINDINGS  Visualized mastoid air cells are clear. Lung apices are clear without a pneumothorax. No evidence for soft tissue swelling or edema. No suspicious lymphadenopathy. There is mild retrolisthesis at C5-C6 which is unchanged. No acute bone abnormality in the cervical spine. Disc space narrowing at C5-C6 and C6-C7. There is medial deviation of the left carotid arteries.  IMPRESSION: No acute intracranial abnormality.  No acute bone abnormality  in the cervical spine. Multilevel degenerative changes in the cervical spine.   Electronically Signed   By: Markus Daft M.D.   On: 05/31/2014 11:03   Ct Chest Wo Contrast  05/31/2014   CLINICAL DATA:  Fall, trauma  EXAM: CT CHEST, ABDOMEN AND PELVIS WITHOUT CONTRAST  TECHNIQUE: Multidetector CT imaging of the chest, abdomen and pelvis was performed following the standard protocol without IV contrast.  COMPARISON:  None.  FINDINGS: CT CHEST FINDINGS  No evidence of aortic injury on this noncontrast exam. No mediastinal hematoma. No pericardial fluid.  There is no pneumothorax or pleural fluid. There is mild bibasilar atelectasis.  Fracture of the left lower ninth rib which is minimally displaced. This is immediately adjacent to the spleen. Note of sternal fracture or scapular fracture or clavicle fracture  CT ABDOMEN AND PELVIS FINDINGS  No evidence of solid organ injury on this noncontrast exam. No free fluid surrounding the liver or spleen. There is bruising within the scope subcutaneous tissues along the left flank.  There is thickening of the left adrenal gland. Kidneys appear normal. The bowel is normal. There is a small umbilical hernia.  The bladder is intact. No free fluid the pelvis. Post hysterectomy.  No aggressive osseous lesion. No evidence of fracture pelvis or spine.  IMPRESSION: Chest Impression:  No evidence of thoracic trauma other than a rib fracture.  Abdomen / Pelvis Impression:  1. Solitary left ninth rib fracture. This fracture is immediately adjacent to the spleen; however, there is no evidence of splenic injury on this noncontrast exam. Lack of IV contrast limits sensitivity for solid organ injury.  2. Bruising along the left flank.   Electronically Signed   By: Suzy Bouchard M.D.   On: 05/31/2014 15:16   Ct Cervical Spine Wo Contrast  05/31/2014   CLINICAL DATA:  Fall and neck pain.  EXAM: CT HEAD WITHOUT CONTRAST  CT CERVICAL SPINE WITHOUT CONTRAST  TECHNIQUE: Multidetector CT imaging of the head and cervical spine was performed following the standard protocol without intravenous contrast. Multiplanar CT image reconstructions of the cervical spine were also generated.  COMPARISON:  07/04/2010  FINDINGS: CT HEAD FINDINGS  There is a chronic area of encephalomalacia in the right basal ganglia and right white matter tract. No evidence for acute hemorrhage, mass lesion, midline shift, hydrocephalus or large infarct. There is an old fracture involving the medial right orbital wall. The visualized paranasal sinuses are clear. No acute bone abnormality.  CT CERVICAL SPINE FINDINGS  Visualized mastoid air cells are clear. Lung apices are clear without a pneumothorax. No evidence for soft tissue swelling or edema. No suspicious lymphadenopathy. There is mild retrolisthesis at C5-C6 which is unchanged. No acute bone abnormality in the cervical spine. Disc space narrowing at C5-C6 and C6-C7. There is medial deviation of the left carotid arteries.  IMPRESSION: No acute intracranial abnormality.  No acute bone abnormality in the cervical spine. Multilevel degenerative changes in the cervical spine.   Electronically Signed   By: Markus Daft M.D.   On: 05/31/2014 11:03   Mr Brain Wo Contrast  05/31/2014    CLINICAL DATA:  73 year old female with syncope, fall, head injury. Initial encounter. HIV.  EXAM: MRI HEAD WITHOUT CONTRAST  TECHNIQUE: Multiplanar, multiecho pulse sequences of the brain and surrounding structures were obtained without intravenous contrast.  COMPARISON:  Head CTs without contrast from 1003 hr today and earlier.  FINDINGS: Cerebral volume is not significantly changed since 2007. Likewise, stable chronic lacunar infarct of  the right basal ganglia and corona radiata with hemosiderin.  Major intracranial vascular flow voids are preserved, with diminutive vertebrobasilar junction on the basis of fetal PCA origins. There is a degree of anterior circulation dolichoectasia.  No restricted diffusion to suggest acute infarction. No midline shift, mass effect, evidence of mass lesion, ventriculomegaly, extra-axial collection or acute intracranial hemorrhage. Cervicomedullary junction and pituitary are within normal limits. Negative visualized cervical spine. Outside of the post ischemic changes in the right hemisphere, gray and white matter signal is within normal limits for age.  Visible internal auditory structures appear normal. Visualized paranasal sinuses and mastoids are clear. Postoperative changes to the globes. Visualized scalp soft tissues are within normal limits. Visualized bone marrow signal is within normal limits.  IMPRESSION: 1.  No acute intracranial abnormality. 2. Chronic right basal ganglia infarct.   Electronically Signed   By: Lars Pinks M.D.   On: 05/31/2014 16:43   Dg Chest Port 1 View  05/31/2014   CLINICAL DATA:  Chest pain.  EXAM: PORTABLE CHEST - 1 VIEW  COMPARISON:  Chest x-ray 07/14/2011 .  FINDINGS: Mediastinum and hilar structures are normal. Cardiomegaly with pulmonary vascular prominence and interstitial prominence noted. Mild congestive heart failure may be present. Pneumonitis cannot be excluded. No pleural effusion or pneumothorax. Degenerative changes both shoulders.   IMPRESSION: 1. Cardiomegaly with very mild pulmonary venous congestion interstitial prominence. A component congestive heart failure cannot be excluded. 2. Mild pneumonitis cannot be excluded.   Electronically Signed   By: Marcello Moores  Register   On: 05/31/2014 13:36    Microbiology: No results found for this or any previous visit (from the past 240 hour(s)).   Labs: Basic Metabolic Panel:  Recent Labs Lab 05/31/14 0933 06/01/14 0559 06/02/14 0637  NA 141 141 141  K 5.0 4.7 4.5  CL 104 103 104  CO2 27 28 24   GLUCOSE 114* 113* 148*  BUN 24* 21 18  CREATININE 1.46* 1.48* 1.38*  CALCIUM 9.3 9.1 9.3  MG 2.2  --   --    Liver Function Tests: No results found for this basename: AST, ALT, ALKPHOS, BILITOT, PROT, ALBUMIN,  in the last 168 hours No results found for this basename: LIPASE, AMYLASE,  in the last 168 hours No results found for this basename: AMMONIA,  in the last 168 hours CBC:  Recent Labs Lab 05/31/14 0933  WBC 5.0  NEUTROABS 2.5  HGB 14.9  HCT 43.2  MCV 102.6*  PLT 172   Cardiac Enzymes:  Recent Labs Lab 05/31/14 0933  TROPONINI <0.30   BNP: BNP (last 3 results)  Recent Labs  05/31/14 0933  PROBNP 133.5*   CBG: No results found for this basename: GLUCAP,  in the last 168 hours  Signed:  Nou Chard K  [facility] 06/02/2014, 3:45 PM

## 2014-06-19 DIAGNOSIS — K449 Diaphragmatic hernia without obstruction or gangrene: Secondary | ICD-10-CM | POA: Insufficient documentation

## 2014-06-19 DIAGNOSIS — R0602 Shortness of breath: Secondary | ICD-10-CM | POA: Insufficient documentation

## 2014-07-03 ENCOUNTER — Other Ambulatory Visit: Payer: Self-pay | Admitting: Licensed Clinical Social Worker

## 2014-07-03 MED ORDER — ABACAVIR SULFATE 300 MG PO TABS: 600.0000 mg | ORAL_TABLET | Freq: Every day | ORAL | Status: DC

## 2014-07-23 ENCOUNTER — Other Ambulatory Visit: Payer: Medicare Other

## 2014-07-23 DIAGNOSIS — L853 Xerosis cutis: Secondary | ICD-10-CM

## 2014-07-23 DIAGNOSIS — Z113 Encounter for screening for infections with a predominantly sexual mode of transmission: Secondary | ICD-10-CM

## 2014-07-23 DIAGNOSIS — M542 Cervicalgia: Secondary | ICD-10-CM

## 2014-07-23 DIAGNOSIS — M8580 Other specified disorders of bone density and structure, unspecified site: Secondary | ICD-10-CM

## 2014-07-23 DIAGNOSIS — E538 Deficiency of other specified B group vitamins: Secondary | ICD-10-CM

## 2014-07-23 DIAGNOSIS — E785 Hyperlipidemia, unspecified: Secondary | ICD-10-CM

## 2014-07-23 DIAGNOSIS — B20 Human immunodeficiency virus [HIV] disease: Secondary | ICD-10-CM

## 2014-07-23 DIAGNOSIS — F329 Major depressive disorder, single episode, unspecified: Secondary | ICD-10-CM

## 2014-07-23 DIAGNOSIS — N184 Chronic kidney disease, stage 4 (severe): Secondary | ICD-10-CM

## 2014-07-23 DIAGNOSIS — F32A Depression, unspecified: Secondary | ICD-10-CM

## 2014-07-24 LAB — CBC WITH DIFFERENTIAL/PLATELET
Basophils Absolute: 0.1 10*3/uL (ref 0.0–0.1)
Basophils Relative: 1 % (ref 0–1)
EOS ABS: 0.1 10*3/uL (ref 0.0–0.7)
EOS PCT: 2 % (ref 0–5)
HCT: 43.2 % (ref 36.0–46.0)
HEMOGLOBIN: 15 g/dL (ref 12.0–15.0)
LYMPHS ABS: 2.2 10*3/uL (ref 0.7–4.0)
LYMPHS PCT: 41 % (ref 12–46)
MCH: 34.9 pg — AB (ref 26.0–34.0)
MCHC: 34.7 g/dL (ref 30.0–36.0)
MCV: 100.5 fL — ABNORMAL HIGH (ref 78.0–100.0)
MONOS PCT: 9 % (ref 3–12)
Monocytes Absolute: 0.5 10*3/uL (ref 0.1–1.0)
Neutro Abs: 2.5 10*3/uL (ref 1.7–7.7)
Neutrophils Relative %: 47 % (ref 43–77)
PLATELETS: 198 10*3/uL (ref 150–400)
RBC: 4.3 MIL/uL (ref 3.87–5.11)
RDW: 14.2 % (ref 11.5–15.5)
WBC: 5.3 10*3/uL (ref 4.0–10.5)

## 2014-07-24 LAB — COMPLETE METABOLIC PANEL WITH GFR
ALT: 32 U/L (ref 0–35)
AST: 21 U/L (ref 0–37)
Albumin: 4.4 g/dL (ref 3.5–5.2)
Alkaline Phosphatase: 99 U/L (ref 39–117)
BUN: 23 mg/dL (ref 6–23)
CO2: 22 mEq/L (ref 19–32)
Calcium: 10 mg/dL (ref 8.4–10.5)
Chloride: 109 mEq/L (ref 96–112)
Creat: 1.52 mg/dL — ABNORMAL HIGH (ref 0.50–1.10)
GFR, Est African American: 39 mL/min — ABNORMAL LOW
GFR, Est Non African American: 34 mL/min — ABNORMAL LOW
Glucose, Bld: 90 mg/dL (ref 70–99)
Potassium: 4.2 mEq/L (ref 3.5–5.3)
Sodium: 143 mEq/L (ref 135–145)
Total Bilirubin: 0.7 mg/dL (ref 0.2–1.2)
Total Protein: 6.5 g/dL (ref 6.0–8.3)

## 2014-07-24 LAB — T-HELPER CELL (CD4) - (RCID CLINIC ONLY)
CD4 % Helper T Cell: 27 % — ABNORMAL LOW (ref 33–55)
CD4 T CELL ABS: 610 /uL (ref 400–2700)

## 2014-07-24 LAB — MICROALBUMIN / CREATININE URINE RATIO
Creatinine, Urine: 169.7 mg/dL
MICROALB/CREAT RATIO: 126.7 mg/g — AB (ref 0.0–30.0)
Microalb, Ur: 21.5 mg/dL — ABNORMAL HIGH (ref <2.0)

## 2014-07-24 LAB — RPR

## 2014-07-25 LAB — HIV-1 RNA QUANT-NO REFLEX-BLD
HIV 1 RNA Quant: <20 copies/mL (ref <20)
HIV-1 RNA Quant, Log: <1.3 {Log} (ref <1.30)

## 2014-07-31 ENCOUNTER — Telehealth: Payer: Self-pay | Admitting: *Deleted

## 2014-07-31 DIAGNOSIS — B20 Human immunodeficiency virus [HIV] disease: Secondary | ICD-10-CM

## 2014-07-31 MED ORDER — DOLUTEGRAVIR SODIUM 50 MG PO TABS: 50.0000 mg | ORAL_TABLET | Freq: Every day | ORAL | Status: DC

## 2014-07-31 NOTE — Telephone Encounter (Signed)
Pharmacy requesting refill of Tivicay. Per pharmacy, patient has 2 more months' supply of other medications stock piled for her before they switch to Triumeq.  RN authorized 2 months Tivicay. Landis Gandy, RN

## 2014-08-06 ENCOUNTER — Other Ambulatory Visit: Payer: Self-pay | Admitting: Infectious Disease

## 2014-08-06 ENCOUNTER — Encounter: Payer: Self-pay | Admitting: Infectious Disease

## 2014-08-06 ENCOUNTER — Ambulatory Visit (INDEPENDENT_AMBULATORY_CARE_PROVIDER_SITE_OTHER): Payer: Medicare Other | Admitting: Infectious Disease

## 2014-08-06 VITALS — BP 138/79 | HR 69 | Temp 98.8°F | Wt 218.0 lb

## 2014-08-06 DIAGNOSIS — Z21 Asymptomatic human immunodeficiency virus [HIV] infection status: Secondary | ICD-10-CM | POA: Diagnosis not present

## 2014-08-06 DIAGNOSIS — H1089 Other conjunctivitis: Secondary | ICD-10-CM | POA: Diagnosis not present

## 2014-08-06 DIAGNOSIS — Z23 Encounter for immunization: Secondary | ICD-10-CM | POA: Diagnosis not present

## 2014-08-06 DIAGNOSIS — B20 Human immunodeficiency virus [HIV] disease: Secondary | ICD-10-CM | POA: Diagnosis not present

## 2014-08-06 DIAGNOSIS — F329 Major depressive disorder, single episode, unspecified: Secondary | ICD-10-CM | POA: Diagnosis not present

## 2014-08-06 DIAGNOSIS — R7989 Other specified abnormal findings of blood chemistry: Secondary | ICD-10-CM | POA: Diagnosis not present

## 2014-08-06 DIAGNOSIS — J329 Chronic sinusitis, unspecified: Secondary | ICD-10-CM | POA: Diagnosis present

## 2014-08-06 DIAGNOSIS — B9789 Other viral agents as the cause of diseases classified elsewhere: Secondary | ICD-10-CM

## 2014-08-06 DIAGNOSIS — G63 Polyneuropathy in diseases classified elsewhere: Secondary | ICD-10-CM

## 2014-08-06 DIAGNOSIS — A499 Bacterial infection, unspecified: Secondary | ICD-10-CM

## 2014-08-06 DIAGNOSIS — N183 Chronic kidney disease, stage 3 unspecified: Secondary | ICD-10-CM

## 2014-08-06 DIAGNOSIS — F32A Depression, unspecified: Secondary | ICD-10-CM

## 2014-08-06 DIAGNOSIS — B349 Viral infection, unspecified: Secondary | ICD-10-CM | POA: Diagnosis not present

## 2014-08-06 DIAGNOSIS — B309 Viral conjunctivitis, unspecified: Secondary | ICD-10-CM | POA: Diagnosis not present

## 2014-08-06 DIAGNOSIS — H109 Unspecified conjunctivitis: Secondary | ICD-10-CM

## 2014-08-06 MED ORDER — ABACAVIR-DOLUTEGRAVIR-LAMIVUD 600-50-300 MG PO TABS: 1.0000 | ORAL_TABLET | Freq: Every day | ORAL | Status: DC

## 2014-08-06 MED ORDER — POLYMYXIN B-TRIMETHOPRIM 10000-0.1 UNIT/ML-% OP SOLN: 1.0000 [drp] | OPHTHALMIC | Status: DC

## 2014-08-06 NOTE — Progress Notes (Signed)
Subjective:    Patient ID: [name], female    DOB: 08/17/1941, 73 y.o.   MRN: [id number]  Cough This is a new problem. The current episode started in the past 7 days. The problem has been gradually improving. The problem occurs every few minutes. The cough is non-productive. Associated symptoms include eye redness. Pertinent negatives include no chest pain, chills, myalgias, rash, shortness of breath or wheezing. The symptoms are aggravated by dust. She has tried nothing for the symptoms.    73 year old Caucasian female with HIV currently well controlled ON  2 tablets of Ziagen daily along with 150mg  of Epivir daily and 50 mg of Tivicay daily.   Her viral load was undetectable and cd4 count healthy.  Lab Results  Component Value Date   HIV1RNAQUANT <20 07/23/2014   Lab Results  Component Value Date   CD4TABS 610 07/23/2014   CD4TABS 780 03/08/2014   CD4TABS 560 11/15/2013     She was supposed to be changed to [facility] but her pharmacy asked if he could stay on the component parts until they had used up the supply that ordered for her and she is continued therefore component parts but should be switching over to single tablet TRIUMEQ at the end of the month ...but they NEVER made this switch I will get this fixed today  She also has c/o nonproductive, cough sinus congestion and conjuntivitis that sounds viral  SReview of Systems  Constitutional: Positive for fatigue. Negative for chills, diaphoresis, appetite change and unexpected weight change.  HENT: Negative for congestion, sneezing and trouble swallowing.   Eyes: Positive for discharge and redness. Negative for visual disturbance.  Respiratory: Positive for cough. Negative for chest tightness, shortness of breath, wheezing and stridor.   Cardiovascular: Negative for chest pain, palpitations and leg swelling.  Gastrointestinal: Negative for diarrhea, constipation, blood in stool, abdominal distention and anal bleeding.    Genitourinary: Negative for dysuria, hematuria, flank pain and difficulty urinating.  Musculoskeletal: Negative for gait problem, joint swelling and myalgias.  Skin: Negative for color change, pallor, rash and wound.  Neurological: Positive for numbness. Negative for tremors and light-headedness.  Hematological: Negative for adenopathy. Does not bruise/bleed easily.  Psychiatric/Behavioral: Negative for behavioral problems, confusion, sleep disturbance, self-injury, dysphoric mood, decreased concentration and agitation. The patient is not nervous/anxious.        Objective:   Physical Exam  Constitutional: She is oriented to person, place, and time. She appears well-developed and well-nourished. No distress.  HENT:  Head: Normocephalic and atraumatic.    Mouth/Throat: Oropharynx is clear and moist. No oropharyngeal exudate.  Eyes: Conjunctivae and EOM are normal. No scleral icterus.  Neck: Normal range of motion. Neck supple. No JVD present.  Cardiovascular: Normal rate and regular rhythm.   Pulmonary/Chest: Effort normal. No respiratory distress. She has no wheezes.  Abdominal: Soft. She exhibits no distension.  Musculoskeletal: She exhibits no edema and no tenderness.  Neurological: She is alert and oriented to person, place, and time. She exhibits normal muscle tone. Coordination normal.  Skin: Skin is warm and dry. She is not diaphoretic. No erythema. No pallor.  Psychiatric: Judgment and thought content normal.          Assessment & Plan:  HIV: Change to TRIUMEQ  Viral sinus infection: use afrin at night for 7 days, decongestants  Viral conjunctivitis: she is very anxiouis it could be bacterial so I will give her polytrim x 7 days  HIV neuropathy: continue gabapentin 300 mg at bedtime  if she tolerates this we can have her to try a dose during the day when she is not driving  Depression:  continue amitriptyline at bedtime for now  Low Vitamin D: She is taking vitamin D  but space and out from her antiretrovirals which is well water do   CKD: relatively stable

## 2014-08-27 ENCOUNTER — Encounter: Payer: Self-pay | Admitting: Infectious Disease

## 2014-09-28 ENCOUNTER — Telehealth: Payer: Self-pay | Admitting: *Deleted

## 2014-09-28 NOTE — Telephone Encounter (Signed)
Patient called, stating that she is having difficulty getting her prescription refilled each month.  Per patient, the pharmacy waits until she calls for a refill request before ordering the medication each month, and she sometimes will miss 1-3 days between refills.  RN asked patient to please call and order a refill when she has 1 week of her pills left to avoid this gap.  RN offered to speak with pharmacist as well, patient agreed.  Per pharmacist, although her insurance pays 100% for the medication, it still costs them $3000 to order it. RN stated that the patient will be on this medication each month unless they hear differently from [place].  While I understand that they do not want to stock pile medications, they SHOULD be ordering this monthly in anticipation of her refill, especially due to the possibility of virologic failure.  Pharmacist stated understanding, agreed to go ahead and order monthly, and will have it shipped directly to the patient.  RN agreed, notified patient. She will be receiving her refill today, has 2 pills left. Landis Gandy, RN

## 2014-10-08 ENCOUNTER — Other Ambulatory Visit (HOSPITAL_COMMUNITY)
Admission: RE | Admit: 2014-10-08 | Discharge: 2014-10-08 | Disposition: A | Discharge status: Home / Self Care | Payer: Medicare Other | Source: Ambulatory Visit | Attending: Infectious Disease | Admitting: Infectious Disease

## 2014-10-08 ENCOUNTER — Other Ambulatory Visit: Payer: Medicare Other

## 2014-10-08 DIAGNOSIS — Z113 Encounter for screening for infections with a predominantly sexual mode of transmission: Secondary | ICD-10-CM | POA: Insufficient documentation

## 2014-10-08 DIAGNOSIS — N760 Acute vaginitis: Secondary | ICD-10-CM | POA: Diagnosis present

## 2014-10-08 DIAGNOSIS — B20 Human immunodeficiency virus [HIV] disease: Secondary | ICD-10-CM

## 2014-10-08 LAB — CBC WITH DIFFERENTIAL/PLATELET
Basophils Absolute: 0.1 10*3/uL (ref 0.0–0.1)
Basophils Relative: 1 % (ref 0–1)
EOS ABS: 0.1 10*3/uL (ref 0.0–0.7)
EOS PCT: 2 % (ref 0–5)
HEMATOCRIT: 42.6 % (ref 36.0–46.0)
Hemoglobin: 15 g/dL (ref 12.0–15.0)
LYMPHS ABS: 2.7 10*3/uL (ref 0.7–4.0)
Lymphocytes Relative: 43 % (ref 12–46)
MCH: 34.8 pg — AB (ref 26.0–34.0)
MCHC: 35.2 g/dL (ref 30.0–36.0)
MCV: 98.8 fL (ref 78.0–100.0)
MONO ABS: 0.5 10*3/uL (ref 0.1–1.0)
MONOS PCT: 8 % (ref 3–12)
MPV: 9.4 fL (ref 9.4–12.4)
Neutro Abs: 2.9 10*3/uL (ref 1.7–7.7)
Neutrophils Relative %: 46 % (ref 43–77)
Platelets: 223 10*3/uL (ref 150–400)
RBC: 4.31 MIL/uL (ref 3.87–5.11)
RDW: 13.6 % (ref 11.5–15.5)
WBC: 6.3 10*3/uL (ref 4.0–10.5)

## 2014-10-08 LAB — COMPLETE METABOLIC PANEL WITH GFR
ALT: 25 U/L (ref 0–35)
AST: <5 U/L (ref 0–37)
Albumin: 4.2 g/dL (ref 3.5–5.2)
Alkaline Phosphatase: 100 U/L (ref 39–117)
BUN: 23 mg/dL (ref 6–23)
CO2: 26 meq/L (ref 19–32)
CREATININE: 1.61 mg/dL — AB (ref 0.50–1.10)
Calcium: 10 mg/dL (ref 8.4–10.5)
Chloride: 104 mEq/L (ref 96–112)
GFR, EST AFRICAN AMERICAN: 36 mL/min — AB
GFR, Est Non African American: 32 mL/min — ABNORMAL LOW
Glucose, Bld: 117 mg/dL — ABNORMAL HIGH (ref 70–99)
Potassium: 3.7 mEq/L (ref 3.5–5.3)
SODIUM: 141 meq/L (ref 135–145)
TOTAL PROTEIN: 6.8 g/dL (ref 6.0–8.3)
Total Bilirubin: 0.7 mg/dL (ref 0.2–1.2)

## 2014-10-08 LAB — RPR

## 2014-10-09 LAB — URINE CYTOLOGY ANCILLARY ONLY
Chlamydia: NEGATIVE
Neisseria Gonorrhea: NEGATIVE

## 2014-10-09 LAB — HIV-1 RNA QUANT-NO REFLEX-BLD
HIV 1 RNA Quant: <20 copies/mL (ref <20)
HIV-1 RNA Quant, Log: <1.3 {Log} (ref <1.30)

## 2014-10-09 LAB — MICROALBUMIN / CREATININE URINE RATIO
Creatinine, Urine: 122.9 mg/dL
Microalb Creat Ratio: 92.8 mg/g — ABNORMAL HIGH (ref 0.0–30.0)
Microalb, Ur: 11.4 mg/dL — ABNORMAL HIGH (ref <2.0)

## 2014-10-09 LAB — T-HELPER CELL (CD4) - (RCID CLINIC ONLY)
CD4 % Helper T Cell: 23 % — ABNORMAL LOW (ref 33–55)
CD4 T CELL ABS: 700 /uL (ref 400–2700)

## 2014-10-31 ENCOUNTER — Encounter: Payer: Self-pay | Admitting: Infectious Disease

## 2014-10-31 ENCOUNTER — Ambulatory Visit (INDEPENDENT_AMBULATORY_CARE_PROVIDER_SITE_OTHER): Payer: Medicare Other | Admitting: Infectious Disease

## 2014-10-31 VITALS — BP 138/82 | HR 73 | Temp 98.2°F | Wt 214.0 lb

## 2014-10-31 DIAGNOSIS — G909 Disorder of the autonomic nervous system, unspecified: Secondary | ICD-10-CM

## 2014-10-31 DIAGNOSIS — Z113 Encounter for screening for infections with a predominantly sexual mode of transmission: Secondary | ICD-10-CM

## 2014-10-31 DIAGNOSIS — H538 Other visual disturbances: Secondary | ICD-10-CM

## 2014-10-31 DIAGNOSIS — E785 Hyperlipidemia, unspecified: Secondary | ICD-10-CM

## 2014-10-31 DIAGNOSIS — N183 Chronic kidney disease, stage 3 unspecified: Secondary | ICD-10-CM

## 2014-10-31 DIAGNOSIS — I10 Essential (primary) hypertension: Secondary | ICD-10-CM

## 2014-10-31 DIAGNOSIS — B20 Human immunodeficiency virus [HIV] disease: Secondary | ICD-10-CM

## 2014-10-31 NOTE — Progress Notes (Signed)
  Subjective:    Patient ID: [name], female    DOB: [birth year], 74 y.o.   MRN: [id number]  HPI  74 year old Caucasian female with HIV currently well controlled ON  2 tablets of Ziagen daily along with 150mg  of Epivir daily and 50 mg of Tivicay daily now TRIUMEQ  Her viral load was undetectable and cd4 count healthy.  Lab Results  Component Value Date   HIV1RNAQUANT <20 10/08/2014   Lab Results  Component Value Date   CD4TABS 700 10/08/2014   CD4TABS 610 07/23/2014   CD4TABS 780 03/08/2014   She has been having trouble with blurry vision and has seen her optometrist and primary care physician. I believe she needs to see an ophthalmologist as well. Blurry vision happens with focusing on certain objects further with her. It is not positional.  SReview of Systems  Constitutional: Negative for diaphoresis, appetite change and unexpected weight change.  HENT: Negative for congestion, sneezing and trouble swallowing.   Eyes: Positive for visual disturbance.  Respiratory: Negative for chest tightness and stridor.   Cardiovascular: Negative for palpitations and leg swelling.  Gastrointestinal: Negative for diarrhea, constipation, blood in stool, abdominal distention and anal bleeding.  Genitourinary: Negative for dysuria, hematuria, flank pain and difficulty urinating.  Musculoskeletal: Negative for joint swelling and gait problem.  Skin: Negative for color change, pallor and wound.  Neurological: Positive for numbness. Negative for tremors and light-headedness.  Hematological: Negative for adenopathy. Does not bruise/bleed easily.  Psychiatric/Behavioral: Negative for behavioral problems, confusion, sleep disturbance, self-injury, dysphoric mood, decreased concentration and agitation. The patient is not nervous/anxious.        Objective:   Physical Exam  Constitutional: She is oriented to person, place, and time. She appears well-developed and well-nourished. No distress.   HENT:  Head: Normocephalic and atraumatic.  Mouth/Throat: No oropharyngeal exudate.  Eyes: Conjunctivae and EOM are normal. No scleral icterus.  Neck: Normal range of motion. Neck supple.  Cardiovascular: Normal rate and regular rhythm.   Pulmonary/Chest: Effort normal. No respiratory distress. She has no wheezes.  Abdominal: She exhibits no distension.  Musculoskeletal: She exhibits no edema or tenderness.  Neurological: She is alert and oriented to person, place, and time. She exhibits normal muscle tone. Coordination normal.  Skin: Skin is warm and dry. No rash noted. She is not diaphoretic. No erythema. No pallor.  Psychiatric: She has a normal mood and affect. Her behavior is normal. Judgment and thought content normal.          Assessment & Plan:  HIV: Continue TRIUMEQ and recheck labs in 6 months time  Blurry vision: Should see an ophthalmologist  HIV neuropathy: continue gabapentin 300 mg at bedtime if she tolerates this we can have her to try a dose during the day when she is not driving  Depression:  continue amitriptyline at bedtime for now  Low Vitamin D: She is taking vitamin D but space and out from her antiretrovirals which is well water do   CKD: relatively stable

## 2015-07-12 ENCOUNTER — Ambulatory Visit: Payer: Medicare Other | Admitting: Neurology

## 2015-08-05 ENCOUNTER — Other Ambulatory Visit (HOSPITAL_COMMUNITY): Payer: Self-pay | Admitting: *Deleted

## 2015-08-05 DIAGNOSIS — G589 Mononeuropathy, unspecified: Secondary | ICD-10-CM

## 2015-08-21 ENCOUNTER — Ambulatory Visit (HOSPITAL_COMMUNITY)
Admission: RE | Admit: 2015-08-21 | Discharge: 2015-08-21 | Disposition: A | Discharge status: Home / Self Care | Payer: Medicare Other | Source: Ambulatory Visit | Attending: *Deleted | Admitting: *Deleted

## 2015-08-21 ENCOUNTER — Other Ambulatory Visit (HOSPITAL_COMMUNITY): Payer: Self-pay | Admitting: *Deleted

## 2015-08-21 DIAGNOSIS — G589 Mononeuropathy, unspecified: Secondary | ICD-10-CM | POA: Insufficient documentation

## 2015-08-21 DIAGNOSIS — R51 Headache: Secondary | ICD-10-CM | POA: Diagnosis not present

## 2015-08-21 DIAGNOSIS — R42 Dizziness and giddiness: Secondary | ICD-10-CM | POA: Diagnosis not present

## 2015-08-21 DIAGNOSIS — H538 Other visual disturbances: Secondary | ICD-10-CM | POA: Diagnosis not present

## 2015-08-22 ENCOUNTER — Ambulatory Visit: Payer: Medicare Other | Admitting: Neurology

## 2015-08-23 ENCOUNTER — Other Ambulatory Visit: Payer: Self-pay | Admitting: Infectious Disease

## 2015-08-23 DIAGNOSIS — B20 Human immunodeficiency virus [HIV] disease: Secondary | ICD-10-CM

## 2015-10-22 ENCOUNTER — Other Ambulatory Visit: Payer: Self-pay | Admitting: *Deleted

## 2015-10-22 DIAGNOSIS — B20 Human immunodeficiency virus [HIV] disease: Secondary | ICD-10-CM

## 2015-10-22 MED ORDER — ABACAVIR-DOLUTEGRAVIR-LAMIVUD 600-50-300 MG PO TABS: 1.0000 | ORAL_TABLET | Freq: Every day | ORAL | Status: DC

## 2015-10-23 ENCOUNTER — Telehealth: Payer: Self-pay | Admitting: *Deleted

## 2015-10-23 NOTE — Telephone Encounter (Signed)
Patient called c/o foot pain and difficulty walking. Asked if she had called her PCP and she said, "she's not too good with feet". 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 She said she would go to the ED to be evaluated; because Dr. Melina Copa previously told her she had gout and she does not. [name]

## 2015-10-28 ENCOUNTER — Other Ambulatory Visit: Payer: Self-pay | Admitting: Infectious Disease

## 2015-10-28 DIAGNOSIS — B20 Human immunodeficiency virus [HIV] disease: Secondary | ICD-10-CM

## 2015-11-13 ENCOUNTER — Ambulatory Visit (INDEPENDENT_AMBULATORY_CARE_PROVIDER_SITE_OTHER): Payer: Medicare Other | Admitting: Infectious Disease

## 2015-11-13 DIAGNOSIS — B20 Human immunodeficiency virus [HIV] disease: Secondary | ICD-10-CM | POA: Diagnosis not present

## 2015-11-13 DIAGNOSIS — E785 Hyperlipidemia, unspecified: Secondary | ICD-10-CM

## 2015-11-13 DIAGNOSIS — H538 Other visual disturbances: Secondary | ICD-10-CM

## 2015-11-13 DIAGNOSIS — Z23 Encounter for immunization: Secondary | ICD-10-CM

## 2015-11-13 DIAGNOSIS — I10 Essential (primary) hypertension: Secondary | ICD-10-CM | POA: Diagnosis not present

## 2015-11-13 DIAGNOSIS — N183 Chronic kidney disease, stage 3 unspecified: Secondary | ICD-10-CM

## 2015-11-13 DIAGNOSIS — Z113 Encounter for screening for infections with a predominantly sexual mode of transmission: Secondary | ICD-10-CM | POA: Diagnosis not present

## 2015-11-13 LAB — COMPLETE METABOLIC PANEL WITH GFR
ALBUMIN: 4.5 g/dL (ref 3.6–5.1)
ALK PHOS: 82 U/L (ref 33–130)
ALT: 50 U/L — ABNORMAL HIGH (ref 6–29)
AST: 44 U/L — ABNORMAL HIGH (ref 10–35)
BILIRUBIN TOTAL: 0.8 mg/dL (ref 0.2–1.2)
BUN: 31 mg/dL — ABNORMAL HIGH (ref 7–25)
CALCIUM: 10.7 mg/dL — AB (ref 8.6–10.4)
CO2: 25 mmol/L (ref 20–31)
CREATININE: 1.58 mg/dL — AB (ref 0.60–0.93)
Chloride: 103 mmol/L (ref 98–110)
GFR, EST AFRICAN AMERICAN: 37 mL/min — AB (ref >=60)
GFR, EST NON AFRICAN AMERICAN: 32 mL/min — AB (ref >=60)
Glucose, Bld: 96 mg/dL (ref 65–99)
Potassium: 4.4 mmol/L (ref 3.5–5.3)
Sodium: 141 mmol/L (ref 135–146)
TOTAL PROTEIN: 7.2 g/dL (ref 6.1–8.1)

## 2015-11-13 LAB — LIPID PANEL
CHOLESTEROL: 263 mg/dL — AB (ref 125–200)
HDL: 26 mg/dL — AB (ref >=46)
TRIGLYCERIDES: 421 mg/dL — AB (ref <150)
Total CHOL/HDL Ratio: 10.1 Ratio — ABNORMAL HIGH (ref <=5.0)

## 2015-11-13 LAB — CBC WITH DIFFERENTIAL/PLATELET
Basophils Absolute: 0.1 10*3/uL (ref 0.0–0.1)
Basophils Relative: 1 % (ref 0–1)
EOS ABS: 0.2 10*3/uL (ref 0.0–0.7)
Eosinophils Relative: 2 % (ref 0–5)
HEMATOCRIT: 45.3 % (ref 36.0–46.0)
HEMOGLOBIN: 15.7 g/dL — AB (ref 12.0–15.0)
LYMPHS ABS: 3.3 10*3/uL (ref 0.7–4.0)
LYMPHS PCT: 41 % (ref 12–46)
MCH: 34.9 pg — AB (ref 26.0–34.0)
MCHC: 34.7 g/dL (ref 30.0–36.0)
MCV: 100.7 fL — AB (ref 78.0–100.0)
MONOS PCT: 9 % (ref 3–12)
MPV: 9.4 fL (ref 8.6–12.4)
Monocytes Absolute: 0.7 10*3/uL (ref 0.1–1.0)
NEUTROS PCT: 47 % (ref 43–77)
Neutro Abs: 3.8 10*3/uL (ref 1.7–7.7)
PLATELETS: 212 10*3/uL (ref 150–400)
RBC: 4.5 MIL/uL (ref 3.87–5.11)
RDW: 13.7 % (ref 11.5–15.5)
WBC: 8 10*3/uL (ref 4.0–10.5)

## 2015-11-13 NOTE — Progress Notes (Signed)
Pt given information list to apply for SPAP.

## 2015-11-13 NOTE — Progress Notes (Signed)
   Subjective:    Patient ID: [name], female    DOB: 1941/06/08, 75 y.o.   MRN: [id number]  HPI  Patient not seen today. Labs were drawn.  Review of Systems     Objective:   Physical Exam        Assessment & Plan:

## 2015-11-14 LAB — HEPATITIS C ANTIBODY: HCV Ab: NEGATIVE

## 2015-11-14 LAB — RPR

## 2015-11-15 LAB — HIV-1 RNA QUANT-NO REFLEX-BLD

## 2015-11-15 LAB — T-HELPER CELL (CD4) - (RCID CLINIC ONLY)
CD4 % Helper T Cell: 25 % — ABNORMAL LOW (ref 33–55)
CD4 T CELL ABS: 840 /uL (ref 400–2700)

## 2015-11-25 ENCOUNTER — Ambulatory Visit: Payer: Medicare Other

## 2015-11-26 ENCOUNTER — Other Ambulatory Visit: Payer: Self-pay | Admitting: *Deleted

## 2015-11-26 DIAGNOSIS — B20 Human immunodeficiency virus [HIV] disease: Secondary | ICD-10-CM

## 2015-11-26 MED ORDER — ABACAVIR-DOLUTEGRAVIR-LAMIVUD 600-50-300 MG PO TABS: 1.0000 | ORAL_TABLET | Freq: Every day | ORAL | Status: DC

## 2015-12-09 ENCOUNTER — Encounter: Payer: Self-pay | Admitting: Gastroenterology

## 2016-05-04 ENCOUNTER — Ambulatory Visit (INDEPENDENT_AMBULATORY_CARE_PROVIDER_SITE_OTHER): Payer: Medicare Other | Admitting: Infectious Disease

## 2016-05-04 ENCOUNTER — Other Ambulatory Visit (HOSPITAL_COMMUNITY)
Admission: RE | Admit: 2016-05-04 | Discharge: 2016-05-04 | Disposition: A | Discharge status: Home / Self Care | Payer: Medicare Other | Source: Ambulatory Visit | Attending: Infectious Disease | Admitting: Infectious Disease

## 2016-05-04 ENCOUNTER — Encounter: Payer: Self-pay | Admitting: Infectious Disease

## 2016-05-04 VITALS — BP 146/91 | HR 80 | Temp 97.8°F | Ht 62.5 in | Wt 210.0 lb

## 2016-05-04 DIAGNOSIS — D126 Benign neoplasm of colon, unspecified: Secondary | ICD-10-CM | POA: Diagnosis present

## 2016-05-04 DIAGNOSIS — N1832 Chronic kidney disease, stage 3b: Secondary | ICD-10-CM | POA: Insufficient documentation

## 2016-05-04 DIAGNOSIS — R7989 Other specified abnormal findings of blood chemistry: Secondary | ICD-10-CM

## 2016-05-04 DIAGNOSIS — R300 Dysuria: Secondary | ICD-10-CM

## 2016-05-04 DIAGNOSIS — N183 Chronic kidney disease, stage 3 unspecified: Secondary | ICD-10-CM

## 2016-05-04 DIAGNOSIS — G909 Disorder of the autonomic nervous system, unspecified: Secondary | ICD-10-CM

## 2016-05-04 DIAGNOSIS — R945 Abnormal results of liver function studies: Secondary | ICD-10-CM

## 2016-05-04 DIAGNOSIS — I10 Essential (primary) hypertension: Secondary | ICD-10-CM

## 2016-05-04 DIAGNOSIS — B20 Human immunodeficiency virus [HIV] disease: Secondary | ICD-10-CM | POA: Diagnosis not present

## 2016-05-04 HISTORY — DX: Chronic kidney disease, stage 3 unspecified: N18.30

## 2016-05-04 HISTORY — DX: Other specified abnormal findings of blood chemistry: R79.89

## 2016-05-04 LAB — CBC WITH DIFFERENTIAL/PLATELET
Basophils Absolute: 0 cells/uL (ref 0–200)
Basophils Relative: 0 %
Eosinophils Absolute: 158 cells/uL (ref 15–500)
Eosinophils Relative: 2 %
HEMATOCRIT: 44.1 % (ref 35.0–45.0)
HEMOGLOBIN: 15.2 g/dL (ref 11.7–15.5)
LYMPHS ABS: 2528 {cells}/uL (ref 850–3900)
Lymphocytes Relative: 32 %
MCH: 35.5 pg — AB (ref 27.0–33.0)
MCHC: 34.5 g/dL (ref 32.0–36.0)
MCV: 103 fL — AB (ref 80.0–100.0)
MONO ABS: 553 {cells}/uL (ref 200–950)
MPV: 8.9 fL (ref 7.5–12.5)
Monocytes Relative: 7 %
Neutro Abs: 4661 cells/uL (ref 1500–7800)
Neutrophils Relative %: 59 %
PLATELETS: 198 10*3/uL (ref 140–400)
RBC: 4.28 MIL/uL (ref 3.80–5.10)
RDW: 14.4 % (ref 11.0–15.0)
WBC: 7.9 10*3/uL (ref 3.8–10.8)

## 2016-05-04 LAB — COMPLETE METABOLIC PANEL WITH GFR
ALT: 55 U/L — ABNORMAL HIGH (ref 6–29)
AST: 37 U/L — AB (ref 10–35)
Albumin: 4.4 g/dL (ref 3.6–5.1)
Alkaline Phosphatase: 60 U/L (ref 33–130)
BUN: 38 mg/dL — ABNORMAL HIGH (ref 7–25)
CALCIUM: 10.1 mg/dL (ref 8.6–10.4)
CHLORIDE: 102 mmol/L (ref 98–110)
CO2: 29 mmol/L (ref 20–31)
Creat: 1.47 mg/dL — ABNORMAL HIGH (ref 0.60–0.93)
GFR, EST AFRICAN AMERICAN: 40 mL/min — AB (ref >=60)
GFR, Est Non African American: 35 mL/min — ABNORMAL LOW (ref >=60)
Glucose, Bld: 123 mg/dL — ABNORMAL HIGH (ref 65–99)
POTASSIUM: 4.2 mmol/L (ref 3.5–5.3)
Sodium: 143 mmol/L (ref 135–146)
Total Bilirubin: 0.7 mg/dL (ref 0.2–1.2)
Total Protein: 6.7 g/dL (ref 6.1–8.1)

## 2016-05-04 MED ORDER — ABACAVIR-DOLUTEGRAVIR-LAMIVUD 600-50-300 MG PO TABS: 1.0000 | ORAL_TABLET | Freq: Every day | ORAL | Status: DC

## 2016-05-04 NOTE — Progress Notes (Signed)
Subjective:   Chief complaint: anxiety and concern re LFT's kidney function and stress at trying to drive in [place] along with followup for her HIV   Patient ID: [name], female    DOB: Mar 22, 1941, 75 y.o.   MRN: [id number]  HPI   75 year old Caucasian female with HIV currently well controlled on TRIUMEQ  Her viral load was undetectable and cd4 count healthy.  Lab Results  Component Value Date   HIV1RNAQUANT <20 11/13/2015   HIV1RNAQUANT <20 10/08/2014   HIV1RNAQUANT <20 07/23/2014    Lab Results  Component Value Date   CD4TABS 840 11/13/2015   CD4TABS 700 10/08/2014   CD4TABS 610 07/23/2014   She was very stressed out today after trying to drive through busy traffic in [place].She is also worried about her LFT's that she was told were elevated. She is also worried about her kidney function which has been stable.  Past Medical History  Diagnosis Date  . Bronchitis   . HIV (human immunodeficiency virus infection) ([place])   . Hypertension   . History of TIAs 1981    left side weakness  . COPD (chronic obstructive pulmonary disease) ([place])   . Kidney disease related to HIV infection [facility])     pt states she has to have her creatinine level checked often   . Depression   . Anxiety   . Arthritis   . Peripheral neuropathy (HCC)     bilateral feet  . CKD (chronic kidney disease) stage 3, GFR 30-59 ml/min 05/04/2016  . Elevated LFTs 05/04/2016    Past Surgical History  Procedure Laterality Date  . Abdominal hysterectomy  1985  . Back surgery    . Tubal ligation    . Esophagogastroduodenoscopy  08/10/2002      NUR: Mild changes of reflux esophagitis limited to gastroesophageal junction  No evidence of ring, stricture, or esophageal candidiasis dysphagia/ Gastritis, possibly H. pylori induced  . Colonoscopy  08/10/2002    NUR: Normal colonoscopy except for some hemorrhoids and some erythema at the dentate line  . Esophagogastroduodenoscopy  12/25/2002    NUR: Erosive antral  gastritis.  The degree of gastritis is more significant than her last exam/ Normal examination of the esophagus/ Esophageal dilatation performed by passing 58 and 60 [place] Maloney  . Colonoscopy  12/10/2006    Edwards:Diffuse colitis in the transverse and descending colon/This is not entirely typical of ischemic colitis or her HIV positive/ disease.  We need to be concerned about other causes  . Esophagogastroduodenoscopy  11/13/2010    RMR: Circumferential distal esophageal erosions with soft peptic stricture, consistent with erosive reflux esophagitis or stricture  formation, status post dilation as described above/  Small hiatal hernia/ Tiny antral erosions, otherwise normal stomach D1 and D2  . Colonoscopy  01/19/2011    RMR: Internal and external hemorrhoids, likely source of hematochezia anal papilla, otherwise normal rectal mucosa/ Left-sided diverticula.  Cecal polyp with status post cold snare polypectomy.  Remainder of colonic mucosa appeared normal  . Cataract extraction, bilateral    . Hemorrhoid surgery  09/02/2012    Procedure: HEMORRHOIDECTOMY;  Surgeon: Jamesetta So, MD;  Location: [facility];  Service: General;  Laterality: N/A;  . Cholecystectomy      Family History  Problem Relation Age of Onset  . Diabetes Brother       Social History   Social History  . Marital Status: Widowed    Spouse Name: N/A  . Number of Children: N/A  . Years  of Education: N/A   Occupational History  . retired    Social History Main Topics  . Smoking status: Never Smoker   . Smokeless tobacco: Never Used  . Alcohol Use: No  . Drug Use: No  . Sexual Activity: Not Currently     Comment: declined condoms   Other Topics Concern  . None   Social History Narrative    Allergies  Allergen Reactions  . Bee Venom Anaphylaxis  . Tenofovir Disoproxil Fumarate [Tenofovir Disoproxil Fumarate] Other (See Comments)    Renal failure, (08/27/2006)  . Codeine Itching and Nausea And Vomiting  .  Haloperidol Lactate Other (See Comments)    Reaction is muscle tension, causes severe spasms in face and neck. Forces eyes to roll in the back of the head  . Haloperidol Lactate Nausea And Vomiting  . Morphine Swelling  . Morphine Sulfate Itching  . Navane [Thiothixene] Other (See Comments) and Nausea And Vomiting    Same muscle spasm reaction as haldol  . Prochlorperazine Nausea And Vomiting  . Prochlorperazine Edisylate Other (See Comments)    Same reaction as haldol   . Promethazine Hcl Other (See Comments)    Same as haldol   . Promethazine Hcl Swelling  . Propoxyphene Nausea And Vomiting  . Propoxyphene N-Acetaminophen Itching and Nausea And Vomiting     Current outpatient prescriptions:  .  abacavir-dolutegravir-lamiVUDine (TRIUMEQ) 600-50-300 MG tablet, Take 1 tablet by mouth daily., Disp: 30 tablet, Rfl: 11 .  albuterol (PROVENTIL HFA;VENTOLIN HFA) 108 (90 BASE) MCG/ACT inhaler, Inhale 2 puffs into the lungs every 6 (six) hours as needed. For shortness of breath, Disp: , Rfl:  .  amitriptyline (ELAVIL) 25 MG tablet, Take 25 mg by mouth at bedtime. , Disp: , Rfl:  .  cholecalciferol (VITAMIN D) 400 UNITS TABS, Take 400 Units by mouth daily., Disp: , Rfl:  .  co-enzyme Q-10 30 MG capsule, Take 30 mg by mouth 2 (two) times daily., Disp: , Rfl:  .  furosemide (LASIX) 40 MG tablet, Take 40 mg by mouth., Disp: , Rfl:  .  HYDROcodone-acetaminophen (NORCO/VICODIN) 5-325 MG per tablet, Take 1-2 tablets by mouth every 4 (four) hours as needed for moderate pain., Disp: 30 tablet, Rfl: 0 .  LORazepam (ATIVAN) 2 MG tablet, Take 2 mg by mouth 2 (two) times daily as needed for anxiety. , Disp: , Rfl:  .  nebivolol (BYSTOLIC) 5 MG tablet, Take 5 mg by mouth daily.  , Disp: , Rfl:  .  nitrofurantoin, macrocrystal-monohydrate, (MACROBID) 100 MG capsule, Take 100 mg by mouth., Disp: , Rfl:  .  thiamine (VITAMIN B-1) 100 MG tablet, Take 100 mg by mouth daily., Disp: , Rfl:  .   trimethoprim-polymyxin b (POLYTRIM) ophthalmic solution, Place 1 drop into both eyes every 4 (four) hours., Disp: 10 mL, Rfl: 0  SReview of Systems  Constitutional: Negative for diaphoresis, appetite change and unexpected weight change.  HENT: Negative for congestion, sneezing and trouble swallowing.   Respiratory: Negative for chest tightness and stridor.   Cardiovascular: Negative for palpitations and leg swelling.  Gastrointestinal: Negative for diarrhea, constipation, blood in stool, abdominal distention and anal bleeding.  Genitourinary: Negative for dysuria, hematuria, flank pain and difficulty urinating.  Musculoskeletal: Negative for joint swelling and gait problem.  Skin: Negative for color change, pallor and wound.  Neurological: Positive for numbness. Negative for tremors and light-headedness.  Hematological: Negative for adenopathy. Does not bruise/bleed easily.  Psychiatric/Behavioral: Negative for behavioral problems, confusion, sleep disturbance, self-injury, dysphoric  mood, decreased concentration and agitation. The patient is nervous/anxious.        Objective:   Physical Exam  Constitutional: She is oriented to person, place, and time. She appears well-developed and well-nourished. No distress.  HENT:  Head: Normocephalic and atraumatic.  Mouth/Throat: No oropharyngeal exudate.  Eyes: Conjunctivae and EOM are normal. No scleral icterus.  Neck: Normal range of motion. Neck supple.  Cardiovascular: Normal rate and regular rhythm.   Pulmonary/Chest: Effort normal. No respiratory distress. She has no wheezes.  Abdominal: She exhibits no distension.  Musculoskeletal: She exhibits no edema or tenderness.  Neurological: She is alert and oriented to person, place, and time. She exhibits normal muscle tone. Coordination normal.  Skin: Skin is warm and dry. No rash noted. She is not diaphoretic. No erythema. No pallor.  Psychiatric: She has a normal mood and affect. Her behavior  is normal. Judgment and thought content normal.          Assessment & Plan:  HIV: Continue TRIUMEQ and recheck labs today and in 6 months time   HIV neuropathy: continue gabapentin  Depression:  continue amitriptyline at bedtime for now  CKD: relatively stable  Elevated LFT's: We will recheck today and also check hepatitis panel  HTN: BP improved with recheck today  I spent greater than 40 minutes with the patient including greater than 50% of time in face to face counsel of the patient re her HIV, her ARV regimen her HIV neuropathy, CKD, elevated LFTS, HTN and in coordination of her care.

## 2016-05-05 LAB — URINE CYTOLOGY ANCILLARY ONLY
Chlamydia: NEGATIVE
NEISSERIA GONORRHEA: NEGATIVE

## 2016-05-05 LAB — URINALYSIS, MICROSCOPIC ONLY
Casts: NONE SEEN [LPF]
Yeast: NONE SEEN [HPF]

## 2016-05-05 LAB — URINALYSIS, ROUTINE W REFLEX MICROSCOPIC
Bilirubin Urine: NEGATIVE
GLUCOSE, UA: NEGATIVE
Hgb urine dipstick: NEGATIVE
Ketones, ur: NEGATIVE
LEUKOCYTES UA: NEGATIVE
NITRITE: NEGATIVE
Specific Gravity, Urine: 1.027 (ref 1.001–1.035)
pH: 6 (ref 5.0–8.0)

## 2016-05-05 LAB — HEPATITIS PANEL, ACUTE
HCV Ab: NEGATIVE
HEP A IGM: NONREACTIVE
HEP B C IGM: NONREACTIVE
Hepatitis B Surface Ag: NEGATIVE

## 2016-05-05 LAB — RPR

## 2016-05-05 LAB — HEPATITIS B SURFACE ANTIBODY,QUALITATIVE: HEP B S AB: NEGATIVE

## 2016-05-06 LAB — T-HELPER CELL (CD4) - (RCID CLINIC ONLY)
CD4 % Helper T Cell: 30 % — ABNORMAL LOW (ref 33–55)
CD4 T CELL ABS: 800 /uL (ref 400–2700)

## 2016-05-07 ENCOUNTER — Telehealth: Payer: Self-pay

## 2016-05-07 LAB — CULTURE, URINE COMPREHENSIVE

## 2016-05-07 LAB — HIV RNA, RTPCR W/R GT (RTI, PI,INT)

## 2016-05-07 NOTE — Telephone Encounter (Signed)
Patient called wanting lab results for urine culture. Explained to patient that culture is not back yet and urinalysis results will need to be viewed by Dr. [name]. Patient stated understanding. Patient is still complaining of frequent and burning urination. Will notify Dr. [name] of complaints. Rodman Key, LPN

## 2016-05-07 NOTE — Telephone Encounter (Signed)
The UA was NOT c/w UTI

## 2016-05-08 ENCOUNTER — Telehealth: Payer: Self-pay | Admitting: *Deleted

## 2016-05-08 NOTE — Telephone Encounter (Signed)
Called patient and left a voice mail with negative results. Advised her to call the clinic if she is still having symptoms. Myrtis Hopping

## 2016-05-08 NOTE — Telephone Encounter (Signed)
-----   Message from Tanya Hayward, MD sent at 05/07/2016 10:19 PM EDT ----- Not significant.  NOT a uti. If patient has burning still would give her fluconazole `150mg  x 1 for yeast infection

## 2016-05-08 NOTE — Telephone Encounter (Signed)
Excellent

## 2016-05-12 NOTE — Telephone Encounter (Signed)
Patient contacted by [facility] Kennyth Lose about results. Rodman Key, LPN

## 2016-05-25 ENCOUNTER — Other Ambulatory Visit: Payer: Self-pay | Admitting: Infectious Disease

## 2016-05-25 DIAGNOSIS — B20 Human immunodeficiency virus [HIV] disease: Secondary | ICD-10-CM

## 2016-07-22 ENCOUNTER — Emergency Department (HOSPITAL_COMMUNITY): Payer: Medicare Other

## 2016-07-22 ENCOUNTER — Encounter (HOSPITAL_COMMUNITY): Payer: Self-pay

## 2016-07-22 ENCOUNTER — Observation Stay (HOSPITAL_COMMUNITY)
Admission: EM | Admit: 2016-07-22 | Discharge: 2016-07-23 | Disposition: A | Discharge status: Home / Self Care | Payer: Medicare Other | Attending: Family Medicine | Admitting: Family Medicine

## 2016-07-22 DIAGNOSIS — N1832 Chronic kidney disease, stage 3b: Secondary | ICD-10-CM | POA: Diagnosis present

## 2016-07-22 DIAGNOSIS — J449 Chronic obstructive pulmonary disease, unspecified: Secondary | ICD-10-CM | POA: Diagnosis not present

## 2016-07-22 DIAGNOSIS — N3000 Acute cystitis without hematuria: Secondary | ICD-10-CM

## 2016-07-22 DIAGNOSIS — R05 Cough: Secondary | ICD-10-CM | POA: Diagnosis not present

## 2016-07-22 DIAGNOSIS — R0602 Shortness of breath: Secondary | ICD-10-CM | POA: Insufficient documentation

## 2016-07-22 DIAGNOSIS — N183 Chronic kidney disease, stage 3 unspecified: Secondary | ICD-10-CM | POA: Diagnosis present

## 2016-07-22 DIAGNOSIS — J189 Pneumonia, unspecified organism: Secondary | ICD-10-CM | POA: Diagnosis not present

## 2016-07-22 DIAGNOSIS — R509 Fever, unspecified: Secondary | ICD-10-CM | POA: Diagnosis present

## 2016-07-22 DIAGNOSIS — R740 Nonspecific elevation of levels of transaminase and lactic acid dehydrogenase [LDH]: Secondary | ICD-10-CM | POA: Insufficient documentation

## 2016-07-22 DIAGNOSIS — R7989 Other specified abnormal findings of blood chemistry: Secondary | ICD-10-CM

## 2016-07-22 DIAGNOSIS — N390 Urinary tract infection, site not specified: Principal | ICD-10-CM

## 2016-07-22 DIAGNOSIS — I129 Hypertensive chronic kidney disease with stage 1 through stage 4 chronic kidney disease, or unspecified chronic kidney disease: Secondary | ICD-10-CM | POA: Insufficient documentation

## 2016-07-22 DIAGNOSIS — Z79899 Other long term (current) drug therapy: Secondary | ICD-10-CM | POA: Diagnosis not present

## 2016-07-22 DIAGNOSIS — Z21 Asymptomatic human immunodeficiency virus [HIV] infection status: Secondary | ICD-10-CM | POA: Insufficient documentation

## 2016-07-22 DIAGNOSIS — B20 Human immunodeficiency virus [HIV] disease: Secondary | ICD-10-CM | POA: Diagnosis present

## 2016-07-22 DIAGNOSIS — R110 Nausea: Secondary | ICD-10-CM | POA: Diagnosis not present

## 2016-07-22 DIAGNOSIS — G909 Disorder of the autonomic nervous system, unspecified: Secondary | ICD-10-CM

## 2016-07-22 DIAGNOSIS — N3090 Cystitis, unspecified without hematuria: Secondary | ICD-10-CM | POA: Diagnosis present

## 2016-07-22 LAB — COMPREHENSIVE METABOLIC PANEL
ALBUMIN: 4 g/dL (ref 3.5–5.0)
ALT: 54 U/L (ref 14–54)
ANION GAP: 8 (ref 5–15)
AST: 38 U/L (ref 15–41)
Alkaline Phosphatase: 71 U/L (ref 38–126)
BILIRUBIN TOTAL: 1.4 mg/dL — AB (ref 0.3–1.2)
BUN: 24 mg/dL — ABNORMAL HIGH (ref 6–20)
CO2: 28 mmol/L (ref 22–32)
Calcium: 9.3 mg/dL (ref 8.9–10.3)
Chloride: 104 mmol/L (ref 101–111)
Creatinine, Ser: 1.41 mg/dL — ABNORMAL HIGH (ref 0.44–1.00)
GFR, EST AFRICAN AMERICAN: 41 mL/min — AB (ref >60)
GFR, EST NON AFRICAN AMERICAN: 35 mL/min — AB (ref >60)
GLUCOSE: 125 mg/dL — AB (ref 65–99)
POTASSIUM: 4.4 mmol/L (ref 3.5–5.1)
Sodium: 140 mmol/L (ref 135–145)
TOTAL PROTEIN: 6.8 g/dL (ref 6.5–8.1)

## 2016-07-22 LAB — URINALYSIS, ROUTINE W REFLEX MICROSCOPIC
Bilirubin Urine: NEGATIVE
Glucose, UA: NEGATIVE mg/dL
HGB URINE DIPSTICK: NEGATIVE
KETONES UR: NEGATIVE mg/dL
Leukocytes, UA: NEGATIVE
NITRITE: POSITIVE — AB
Protein, ur: 100 mg/dL — AB
SPECIFIC GRAVITY, URINE: 1.025 (ref 1.005–1.030)
pH: 6 (ref 5.0–8.0)

## 2016-07-22 LAB — CBC WITH DIFFERENTIAL/PLATELET
BASOS ABS: 0 10*3/uL (ref 0.0–0.1)
Basophils Relative: 0 %
Eosinophils Absolute: 0.1 10*3/uL (ref 0.0–0.7)
Eosinophils Relative: 1 %
HEMATOCRIT: 41.8 % (ref 36.0–46.0)
HEMOGLOBIN: 14.5 g/dL (ref 12.0–15.0)
LYMPHS PCT: 13 %
Lymphs Abs: 0.9 10*3/uL (ref 0.7–4.0)
MCH: 36.7 pg — ABNORMAL HIGH (ref 26.0–34.0)
MCHC: 34.7 g/dL (ref 30.0–36.0)
MCV: 105.8 fL — AB (ref 78.0–100.0)
Monocytes Absolute: 0.2 10*3/uL (ref 0.1–1.0)
Monocytes Relative: 3 %
NEUTROS ABS: 5.4 10*3/uL (ref 1.7–7.7)
NEUTROS PCT: 83 %
Platelets: 144 10*3/uL — ABNORMAL LOW (ref 150–400)
RBC: 3.95 MIL/uL (ref 3.87–5.11)
RDW: 13.3 % (ref 11.5–15.5)
WBC: 6.5 10*3/uL (ref 4.0–10.5)

## 2016-07-22 LAB — I-STAT CG4 LACTIC ACID, ED
LACTIC ACID, VENOUS: 1.25 mmol/L (ref 0.5–1.9)
Lactic Acid, Venous: 2.69 mmol/L (ref 0.5–1.9)

## 2016-07-22 LAB — URINE MICROSCOPIC-ADD ON

## 2016-07-22 MED ORDER — SODIUM CHLORIDE 0.9% FLUSH
3.0000 mL | Freq: Two times a day (BID) | INTRAVENOUS | Status: DC
  Administered 2016-07-23: 3 mL via INTRAVENOUS

## 2016-07-22 MED ORDER — DEXTROSE 5 % IV SOLN
500.0000 mg | Freq: Once | INTRAVENOUS | Status: AC
  Administered 2016-07-22: 500 mg via INTRAVENOUS
  Filled 2016-07-22: qty 500

## 2016-07-22 MED ORDER — SODIUM CHLORIDE 0.9 % IV SOLN
1000.0000 mL | Freq: Once | INTRAVENOUS | Status: AC
  Administered 2016-07-22: 1000 mL via INTRAVENOUS

## 2016-07-22 MED ORDER — ALBUTEROL SULFATE (2.5 MG/3ML) 0.083% IN NEBU: 2.5000 mg | INHALATION_SOLUTION | Freq: Four times a day (QID) | RESPIRATORY_TRACT | Status: DC | PRN

## 2016-07-22 MED ORDER — ABACAVIR-DOLUTEGRAVIR-LAMIVUD 600-50-300 MG PO TABS
1.0000 | ORAL_TABLET | Freq: Every day | ORAL | Status: DC
  Administered 2016-07-22 – 2016-07-23 (×2): 1 via ORAL
  Filled 2016-07-22 (×3): qty 1

## 2016-07-22 MED ORDER — DEXTROSE 5 % IV SOLN
500.0000 mg | INTRAVENOUS | Status: DC
  Administered 2016-07-23: 500 mg via INTRAVENOUS
  Filled 2016-07-22 (×2): qty 500

## 2016-07-22 MED ORDER — SODIUM CHLORIDE 0.9 % IV SOLN
INTRAVENOUS | Status: DC
  Administered 2016-07-22: 08:00:00 via INTRAVENOUS

## 2016-07-22 MED ORDER — POLYVINYL ALCOHOL 1.4 % OP SOLN: 1.0000 [drp] | OPHTHALMIC | Status: DC | PRN

## 2016-07-22 MED ORDER — DIPHENHYDRAMINE HCL 25 MG PO CAPS: 25.0000 mg | ORAL_CAPSULE | Freq: Every day | ORAL | Status: DC | PRN

## 2016-07-22 MED ORDER — DEXTROSE 5 % IV SOLN
1.0000 g | Freq: Once | INTRAVENOUS | Status: AC
  Administered 2016-07-22: 1 g via INTRAVENOUS
  Filled 2016-07-22: qty 10

## 2016-07-22 MED ORDER — SODIUM CHLORIDE 0.9 % IV SOLN: 250.0000 mL | INTRAVENOUS | Status: DC | PRN

## 2016-07-22 MED ORDER — NEBIVOLOL HCL 10 MG PO TABS
10.0000 mg | ORAL_TABLET | Freq: Every day | ORAL | Status: DC
  Administered 2016-07-23: 10 mg via ORAL
  Filled 2016-07-22 (×2): qty 1

## 2016-07-22 MED ORDER — LORAZEPAM 1 MG PO TABS
2.0000 mg | ORAL_TABLET | Freq: Two times a day (BID) | ORAL | Status: DC | PRN
  Administered 2016-07-23: 2 mg via ORAL
  Filled 2016-07-22: qty 2

## 2016-07-22 MED ORDER — AMITRIPTYLINE HCL 25 MG PO TABS
50.0000 mg | ORAL_TABLET | Freq: Every day | ORAL | Status: DC
  Administered 2016-07-22: 50 mg via ORAL
  Filled 2016-07-22: qty 2

## 2016-07-22 MED ORDER — DEXTROSE 5 % IV SOLN
2.0000 g | INTRAVENOUS | Status: DC
  Administered 2016-07-23: 2 g via INTRAVENOUS
  Filled 2016-07-22 (×2): qty 2

## 2016-07-22 MED ORDER — HYDROCODONE-ACETAMINOPHEN 5-325 MG PO TABS
1.0000 | ORAL_TABLET | ORAL | Status: DC | PRN
  Administered 2016-07-22: 2 via ORAL
  Filled 2016-07-22 (×2): qty 2

## 2016-07-22 MED ORDER — SODIUM CHLORIDE 0.9 % IV SOLN
1000.0000 mL | INTRAVENOUS | Status: DC
  Administered 2016-07-22: 1000 mL via INTRAVENOUS

## 2016-07-22 MED ORDER — ACETAMINOPHEN 325 MG PO TABS: 650.0000 mg | ORAL_TABLET | Freq: Once | ORAL | Status: DC

## 2016-07-22 MED ORDER — SODIUM CHLORIDE 0.9% FLUSH: 3.0000 mL | INTRAVENOUS | Status: DC | PRN

## 2016-07-22 MED ORDER — FUROSEMIDE 40 MG PO TABS
40.0000 mg | ORAL_TABLET | Freq: Every day | ORAL | Status: DC
  Administered 2016-07-22 – 2016-07-23 (×2): 40 mg via ORAL
  Filled 2016-07-22 (×2): qty 1

## 2016-07-22 MED ORDER — ENOXAPARIN SODIUM 40 MG/0.4ML ~~LOC~~ SOLN
40.0000 mg | SUBCUTANEOUS | Status: DC
  Administered 2016-07-22: 40 mg via SUBCUTANEOUS
  Filled 2016-07-22: qty 0.4

## 2016-07-22 NOTE — Progress Notes (Signed)
ANTIBIOTIC CONSULT NOTE  Pharmacy Consult for Rocephin and Zithromax Indication:  CAP  Allergies  Allergen Reactions  . Bee Venom Anaphylaxis  . Tenofovir Disoproxil Fumarate [Tenofovir Disoproxil Fumarate] Other (See Comments)    Renal failure, (08/27/2006)  . Codeine Itching and Nausea And Vomiting  . Haloperidol Lactate Other (See Comments)    Reaction is muscle tension, causes severe spasms in face and neck. Forces eyes to roll in the back of the head  . Haloperidol Lactate Nausea And Vomiting  . Morphine Swelling  . Morphine Sulfate Itching  . Navane [Thiothixene] Other (See Comments) and Nausea And Vomiting    Same muscle spasm reaction as haldol  . Prochlorperazine Nausea And Vomiting  . Prochlorperazine Edisylate Other (See Comments)    Same reaction as haldol   . Promethazine Hcl Other (See Comments)    Same as haldol   . Promethazine Hcl Swelling  . Propoxyphene Nausea And Vomiting  . Propoxyphene N-Acetaminophen Itching and Nausea And Vomiting   Patient Measurements: Weight: 210 lb (95.3 kg)  Vital Signs: Temp: 98.5 F (36.9 C) (09/27 1012) Temp Source: Oral (09/27 1012) BP: 108/56 (09/27 1130) Pulse Rate: 84 (09/27 1130)  Labs:  Recent Labs  07/22/16 0826  WBC 6.5  HGB 14.5  PLT 144*  CREATININE 1.41*   No results for input(s): VANCOTROUGH, VANCOPEAK, VANCORANDOM, GENTTROUGH, GENTPEAK, GENTRANDOM, TOBRATROUGH, TOBRAPEAK, TOBRARND, AMIKACINPEAK, AMIKACINTROU, AMIKACIN in the last 72 hours.   Medical History: Past Medical History:  Diagnosis Date  . Anxiety   . Arthritis   . Bronchitis   . CKD (chronic kidney disease) stage 3, GFR 30-59 ml/min 05/04/2016  . COPD (chronic obstructive pulmonary disease) ([place])   . Depression   . Elevated LFTs 05/04/2016  . History of TIAs 1981   left side weakness  . HIV (human immunodeficiency virus infection) ([place])   . Hypertension   . Kidney disease related to HIV infection [facility])    pt states she has to have her  creatinine level checked often   . Peripheral neuropathy (HCC)    bilateral feet   Assessment: 75yo female started on Rocephin and Zithromax in ED.  No renal adjustment needed.  Estimated Creatinine Clearance: 37.5 mL/min (by C-G formula based on SCr of 1.41 mg/dL (H)).  Plan: Rocephin 2gm IV q24hrs Zithromax 500mg  IV q24hrs Monitor labs, progress, c/s F/U to switch Zithromax to PO  Ena Dawley, [facility] 07/22/2016

## 2016-07-22 NOTE — ED Notes (Signed)
Returned from  X-ray

## 2016-07-22 NOTE — ED Notes (Signed)
Per Dr Tomi Bamberger may discontinue precautions

## 2016-07-22 NOTE — ED Notes (Signed)
Pt received Tylenol 1000mg  po at 0710 given by EMS. Dr Tomi Bamberger notified. Discontinued new order

## 2016-07-22 NOTE — ED Notes (Signed)
Patient transported to X-ray 

## 2016-07-22 NOTE — H&P (Signed)
History and Physical  [name] [id number] DOB: 1941/04/28 DOA: 07/22/2016  PCP: Octavio Graves, DO  Patient coming from: Home   Chief Complaint: Fever   HPI:  75 year-old woman PMH of HIV presented by the EMS with reported fever, cough, chills, shortness of breath, nausea. She was admitted for further evaluation of fever, possible pneumonia and UTI.  Has felt poorly in general for the last 2-3 days, difficult to articulate, poor energy, malaise. Today she woke up with chills with associated cough and shortness of breath. Water helped her feel better. Reports some mild lower abdominal pain. Reports eating and drinking well. Reports compliance with medication.  Denies any painful urination or burning, chest pain, diarrhea, or vomiting.    ED Course:  Temperature 103.1. Hemodynamics stable. Treated with ceftriaxone and azithromycin. Blood cultures and urine culture obtained. Pertinent labs: Lactic acid 2.69. Platelets 144. WBC and hemoglobin normal. Creatinine at baseline. CMP unremarkable. Urinalysis equivocal.  Imaging: CXR independently reviewed, no acute disease. Radiology interpretation: Mild interstitial prominence of unknown chronicity. Pulmonary edema or viral process could have this appearance.  Review of Systems:  Negative for visual changes, sore throat, rash, new muscle aches, chest pain, SOB, dysuria, bleeding, vomiting, or diarrhea.  Past Medical History:  Diagnosis Date  . Anxiety   . Arthritis   . Bronchitis   . CKD (chronic kidney disease) stage 3, GFR 30-59 ml/min 05/04/2016  . COPD (chronic obstructive pulmonary disease) ([place])   . Depression   . Elevated LFTs 05/04/2016  . History of TIAs 1981   left side weakness  . HIV (human immunodeficiency virus infection) ([place])   . Hypertension   . Kidney disease related to HIV infection [facility])    pt states she has to have her creatinine level checked often   . Peripheral neuropathy (HCC)    bilateral feet     Past Surgical History:  Procedure Laterality Date  . ABDOMINAL HYSTERECTOMY  1985  . BACK SURGERY    . CATARACT EXTRACTION, BILATERAL    . CHOLECYSTECTOMY    . COLONOSCOPY  08/10/2002   NUR: Normal colonoscopy except for some hemorrhoids and some erythema at the dentate line  . COLONOSCOPY  12/10/2006   Edwards:Diffuse colitis in the transverse and descending colon/This is not entirely typical of ischemic colitis or her HIV positive/ disease.  We need to be concerned about other causes  . COLONOSCOPY  01/19/2011   RMR: Internal and external hemorrhoids, likely source of hematochezia anal papilla, otherwise normal rectal mucosa/ Left-sided diverticula.  Cecal polyp with status post cold snare polypectomy.  Remainder of colonic mucosa appeared normal  . ESOPHAGOGASTRODUODENOSCOPY  08/10/2002     NUR: Mild changes of reflux esophagitis limited to gastroesophageal junction  No evidence of ring, stricture, or esophageal candidiasis dysphagia/ Gastritis, possibly H. pylori induced  . ESOPHAGOGASTRODUODENOSCOPY  12/25/2002   NUR: Erosive antral gastritis.  The degree of gastritis is more significant than her last exam/ Normal examination of the esophagus/ Esophageal dilatation performed by passing 26 and 46 [place] Maloney  . ESOPHAGOGASTRODUODENOSCOPY  11/13/2010   RMR: Circumferential distal esophageal erosions with soft peptic stricture, consistent with erosive reflux esophagitis or stricture  formation, status post dilation as described above/  Small hiatal hernia/ Tiny antral erosions, otherwise normal stomach D1 and D2  . HEMORRHOID SURGERY  09/02/2012   Procedure: HEMORRHOIDECTOMY;  Surgeon: Jamesetta So, MD;  Location: [facility];  Service: General;  Laterality: N/A;  . TUBAL LIGATION  reports that she has never smoked. She has never used smokeless tobacco. She reports that she does not drink alcohol or use drugs. Ambulatory status: Stable   Allergies  Allergen Reactions  . Bee  Venom Anaphylaxis  . Tenofovir Disoproxil Fumarate [Tenofovir Disoproxil Fumarate] Other (See Comments)    Renal failure, (08/27/2006)  . Codeine Itching and Nausea And Vomiting  . Haloperidol Lactate Other (See Comments)    Reaction is muscle tension, causes severe spasms in face and neck. Forces eyes to roll in the back of the head  . Haloperidol Lactate Nausea And Vomiting  . Morphine Swelling  . Morphine Sulfate Itching  . Navane [Thiothixene] Other (See Comments) and Nausea And Vomiting    Same muscle spasm reaction as haldol  . Prochlorperazine Nausea And Vomiting  . Prochlorperazine Edisylate Other (See Comments)    Same reaction as haldol   . Promethazine Hcl Other (See Comments)    Same as haldol   . Promethazine Hcl Swelling  . Propoxyphene Nausea And Vomiting  . Propoxyphene N-Acetaminophen Itching and Nausea And Vomiting    Family History  Problem Relation Age of Onset  . Diabetes Brother      Prior to Admission medications   Medication Sig Start Date End Date Taking? Authorizing Provider  amitriptyline (ELAVIL) 50 MG tablet Take 50 mg by mouth at bedtime.    Yes Historical Provider, MD  Cholecalciferol (VITAMIN D3) 10000 units TABS Take 1 tablet by mouth daily.   Yes Historical Provider, MD  HYDROcodone-acetaminophen (NORCO/VICODIN) 5-325 MG per tablet Take 1-2 tablets by mouth every 4 (four) hours as needed for moderate pain. 06/02/14  Yes [name], MD  LORazepam (ATIVAN) 2 MG tablet Take 2 mg by mouth 2 (two) times daily as needed for anxiety.    Yes Historical Provider, MD  nitrofurantoin, macrocrystal-monohydrate, (MACROBID) 100 MG capsule Take 100 mg by mouth 2 (two) times daily.    Yes Historical Provider, MD  ondansetron (ZOFRAN) 8 MG tablet Take 8 mg by mouth daily.   Yes Historical Provider, MD  thiamine (VITAMIN B-1) 100 MG tablet Take 100 mg by mouth daily.   Yes Historical Provider, MD  TRIUMEQ 600-50-300 MG tablet TAKE 1 TABLET ONCE DAILY. 05/25/16  Yes  Truman Hayward, MD  albuterol (PROVENTIL HFA;VENTOLIN HFA) 108 (90 BASE) MCG/ACT inhaler Inhale 2 puffs into the lungs every 6 (six) hours as needed. For shortness of breath    Historical Provider, MD  cholecalciferol (VITAMIN D) 400 UNITS TABS Take 400 Units by mouth daily.    Historical Provider, MD  co-enzyme Q-10 30 MG capsule Take 30 mg by mouth 2 (two) times daily.    Historical Provider, MD  furosemide (LASIX) 40 MG tablet Take 40 mg by mouth.    Historical Provider, MD  nebivolol (BYSTOLIC) 5 MG tablet Take 5 mg by mouth daily.      Historical Provider, MD  trimethoprim-polymyxin b (POLYTRIM) ophthalmic solution Place 1 drop into both eyes every 4 (four) hours. 08/06/14   Truman Hayward, MD    Physical Exam: Vitals:   07/22/16 1255 07/22/16 1325 07/22/16 1341 07/22/16 1500  BP: (!) 105/44 112/61 112/61 (!) 118/58  Pulse: 78 77    Resp: 20     Temp: 98.6 F (37 C) 98.2 F (36.8 C)    TempSrc: Oral Oral    SpO2: 97% 99%    Weight:  99.3 kg (219 lb)    Height:  5\' 2"  (1.575  m)     Constitutional:  . Appears calm and comfortable Eyes:  . PERRL and irises appear normal . Normal conjunctivae and lids ENMT:  . external ears, nose appear normal . grossly normal hearing . Lips appear normal; tongue appears normal Neck:  . neck appears normal, no masses Respiratory:  . Few crackles bilaterally at the bases, no w/r/r.  . Respiratory effort normal. No retractions or accessory muscle use Cardiovascular:  . RRR, no m/r/g . No significant edema   Abdomen:   soft, non tender, and non distended.   Small umbilical hernia which is easily reducible  Skin:  . No rashes, lesions, ulcers . palpation of skin: no induration or nodules Psychiatric grossly normal mood and affect, judgment and insight appear normal  Wt Readings from Last 3 Encounters:  07/22/16 99.3 kg (219 lb)  05/04/16 95.3 kg (210 lb)  10/31/14 97.1 kg (214 lb)    I have personally reviewed following  labs and imaging studies  Labs on Admission:  CBC:  Recent Labs Lab 07/22/16 0826  WBC 6.5  NEUTROABS 5.4  HGB 14.5  HCT 41.8  MCV 105.8*  PLT 510*   Basic Metabolic Panel:  Recent Labs Lab 07/22/16 0826  NA 140  K 4.4  CL 104  CO2 28  GLUCOSE 125*  BUN 24*  CREATININE 1.41*  CALCIUM 9.3   Liver Function Tests:  Recent Labs Lab 07/22/16 0826  AST 38  ALT 54  ALKPHOS 71  BILITOT 1.4*  PROT 6.8  ALBUMIN 4.0   Urine analysis:    Component Value Date/Time   COLORURINE YELLOW 07/22/2016 0830   APPEARANCEUR CLOUDY (A) 07/22/2016 0830   LABSPEC 1.025 07/22/2016 0830   PHURINE 6.0 07/22/2016 0830   GLUCOSEU NEGATIVE 07/22/2016 0830   GLUCOSEU NEG mg/dL 09/20/2007 1114   HGBUR NEGATIVE 07/22/2016 0830   HGBUR moderate 06/04/2010 1425   BILIRUBINUR NEGATIVE 07/22/2016 0830   KETONESUR NEGATIVE 07/22/2016 0830   PROTEINUR 100 (A) 07/22/2016 0830   UROBILINOGEN 1.0 05/31/2014 0933   NITRITE POSITIVE (A) 07/22/2016 0830   LEUKOCYTESUR NEGATIVE 07/22/2016 0830   Radiological Exams on Admission: Dg Chest 2 View  Result Date: 07/22/2016 CLINICAL DATA:  Fever and chills, cough. EXAM: CHEST  2 VIEW COMPARISON:  CT chest and chest radiograph 05/31/2014. FINDINGS: Trachea is midline. Heart size stable. There may be mild interstitial prominence, of unknown chronicity. No pleural fluid. IMPRESSION: Mild interstitial prominence is of unknown chronicity. Pulmonary edema or a viral process could have this appearance. Electronically Signed   By: Lorin Picket M.D.   On: 07/22/2016 09:12     Principal Problem:   Fever Active Problems:   Human immunodeficiency virus (HIV) disease (HCC)   HIV-1 associated autonomic neuropathy (HCC)   Acute cystitis   CKD (chronic kidney disease) stage 3, GFR 30-59 ml/min   CAP (community acquired pneumonia)   Assessment/Plan 1. Fever, possible UTI versus occult pneumonia. 2. HIV, well controlled. Summary of records reviewed:  Followed by Dr. [name], last seen July 2017; at that time HIV RNA was undetectable and CD4 count was 800. Recommendation was to continue chronic medications. Last hospitalized 05/2014 for syncope. Workup at that time was unremarkable. 3. HTN. Pressures are stable. Continue current medications.  4. COPD. stable. Continue as needed bronchodilators. She has oxygen PRN.  5. CKD stage III at baseline. 6. Bilateral pedal neuropathy, stable, continue gabapentin    Appears stable, obs to medical bed  Empiric abx directed at community-acquired UTI/CAP  Continue HIV medication regimen and gabapentin for neuropathy  DVT prophylaxis:SCDs Code Status: Full Family Communication: No family bedside Disposition Plan: Discharge home once improved.  Consults called: None  Admission status: Observation   It is my clinical opinion that referral for OBSERVATION is reasonable and necessary in this 49 yow   . presenting with symptoms of fever, concerning for pneumonia or UTI . in the context of PMH including HIV . with pertinent physical exam findings of crackles on lung exam . and pertinent radiographic and laboratory data including abnormal u/a, abnormal CXR  The aforementioned taken together are felt to place the patient at high risk for further clinical deterioration. However it is anticipated that the patient may be medically stable for discharge from the hospital within 24 to 48 hours.   Time spent: 55 minutes  Murray Hodgkins, MD  [facility] Direct contact: [phone] --[place]ia [place]  --[email or web address]; password [email or web address]  7PM-7AM contact night coverage as above  07/22/2016, 4:01 PM   By signing my name below, I, Collene Leyden, attest that this documentation has been prepared under the direction and in the presence of Murray Hodgkins, MD. Electronically signed: Collene Leyden, Scribe. 07/22/16 2:37 PM   I personally performed the services described in this documentation. All medical  record entries made by the scribe were at my direction. I have reviewed the chart and agree that the record reflects my personal performance and is accurate and complete. Murray Hodgkins, MD

## 2016-07-22 NOTE — ED Notes (Signed)
LACTIC ACID 2.69. Dr Tomi Bamberger notified

## 2016-07-22 NOTE — ED Notes (Signed)
Sats 90-91% on room ai. O2 at 2 l/min initiated

## 2016-07-22 NOTE — ED Provider Notes (Signed)
[facility] DEPT Provider Note   CSN: [id number] Arrival date & time: 07/22/16  0757  By signing my name below, I, [name], attest that this documentation has been prepared under the direction and in the presence of Tanya Rank, MD. Electronically Signed: Rayna [name], ED Scribe. 07/22/16. 8:17 AM.   History   Chief Complaint Chief Complaint  Patient presents with  . Fever    HPI HPI Comments: [name] is a 75 y.o. female with a h/o HIV who presents to the Emergency Department by ambulance complaining of a moderate fever (103.1 F in triage). She reports an associated cough x 1 week that is worse at night, severe chills onset this morning, mild intermittent SOB and nausea. Pt reports a h/o pneumonia. She denies being on dialysis. Pt confirms her h/o HIV and states her CD4 counts have been nml and her viral loads undetectable. She denies sore throat, difficulty urinating and vomiting.   The history is provided by the patient. No language interpreter was used.    Past Medical History:  Diagnosis Date  . Anxiety   . Arthritis   . Bronchitis   . CKD (chronic kidney disease) stage 3, GFR 30-59 ml/min 05/04/2016  . COPD (chronic obstructive pulmonary disease) ([place])   . Depression   . Elevated LFTs 05/04/2016  . History of TIAs 1981   left side weakness  . HIV (human immunodeficiency virus infection) ([place])   . Hypertension   . Kidney disease related to HIV infection [facility])    pt states she has to have her creatinine level checked often   . Peripheral neuropathy (HCC)    bilateral feet    Patient Active Problem List   Diagnosis Date Noted  . CKD (chronic kidney disease) stage 3, GFR 30-59 ml/min 05/04/2016  . Elevated LFTs 05/04/2016  . 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 Breath shortness 06/19/2014  . Syncope 05/31/2014  . Rib fracture 05/31/2014  . Syncope and collapse 05/31/2014  . Weight gain 03/21/2014  . Cervical spondylosis 08/15/2013  . Neck pain on  left side 05/24/2013  . Unspecified vitamin D deficiency 12/14/2012  . Vitamin D toxicity 12/14/2012  . Hemorrhoid 09/01/2012  . Myalgia 02/25/2012  . HTN (hypertension) 11/25/2011  . Cramps, muscle, general 08/18/2011  . RECTAL BLEEDING 11/03/2010  . DYSPHAGIA 11/03/2010  . FATIGUE 08/29/2010  . MUSCLE PAIN 06/04/2010  . ACUTE CYSTITIS 05/12/2010  . RENAL INSUFFICIENCY 12/18/2008  . DEPRESSION 09/04/2008  . HYPERLIPIDEMIA 05/22/2008  . IRRITABLE BOWEL SYNDROME 05/22/2008  . HERNIATED CERVICAL DISC 10/05/2007  . HERNIATED LUMBAR DISC 10/05/2007  . ABDOMINAL PAIN, LEFT LOWER QUADRANT 10/05/2007  . PERSONAL HISTORY OF URINARY TRACT INFECTION 09/20/2007  . HIV-1 associated autonomic neuropathy ([place]) 03/16/2007  . ELBOW PAIN 03/16/2007  . ISCHEMIC COLITIS 12/27/2006  . RENAL FAILURE NOS 12/27/2006  . Human immunodeficiency virus (HIV) disease ([place]) 08/27/2006    Past Surgical History:  Procedure Laterality Date  . ABDOMINAL HYSTERECTOMY  1985  . BACK SURGERY    . CATARACT EXTRACTION, BILATERAL    . CHOLECYSTECTOMY    . COLONOSCOPY  08/10/2002   NUR: Normal colonoscopy except for some hemorrhoids and some erythema at the dentate line  . COLONOSCOPY  12/10/2006   Edwards:Diffuse colitis in the transverse and descending colon/This is not entirely typical of ischemic colitis or her HIV positive/ disease.  We need to be concerned about other causes  . COLONOSCOPY  01/19/2011   RMR: Internal and external hemorrhoids, likely source of hematochezia  anal papilla, otherwise normal rectal mucosa/ Left-sided diverticula.  Cecal polyp with status post cold snare polypectomy.  Remainder of colonic mucosa appeared normal  . ESOPHAGOGASTRODUODENOSCOPY  08/10/2002     NUR: Mild changes of reflux esophagitis limited to gastroesophageal junction  No evidence of ring, stricture, or esophageal candidiasis dysphagia/ Gastritis, possibly H. pylori induced  . ESOPHAGOGASTRODUODENOSCOPY  12/25/2002    NUR: Erosive antral gastritis.  The degree of gastritis is more significant than her last exam/ Normal examination of the esophagus/ Esophageal dilatation performed by passing 50 and [address] [place]  . ESOPHAGOGASTRODUODENOSCOPY  11/13/2010   RMR: Circumferential distal esophageal erosions with soft peptic stricture, consistent with erosive reflux esophagitis or stricture  formation, status post dilation as described above/  Small hiatal hernia/ Tiny antral erosions, otherwise normal stomach D1 and D2  . HEMORRHOID SURGERY  09/02/2012   Procedure: HEMORRHOIDECTOMY;  Surgeon: Jamesetta So, MD;  Location: [facility];  Service: General;  Laterality: N/A;  . TUBAL LIGATION      OB History    Gravida Para Term Preterm AB Living   4 3 2 1 1 3    SAB TAB Ectopic Multiple Live Births   1               Home Medications    Prior to Admission medications   Medication Sig Start Date End Date Taking? Authorizing Provider  albuterol (PROVENTIL HFA;VENTOLIN HFA) 108 (90 BASE) MCG/ACT inhaler Inhale 2 puffs into the lungs every 6 (six) hours as needed. For shortness of breath    Historical Provider, MD  amitriptyline (ELAVIL) 25 MG tablet Take 25 mg by mouth at bedtime.     Historical Provider, MD  cholecalciferol (VITAMIN D) 400 UNITS TABS Take 400 Units by mouth daily.    Historical Provider, MD  co-enzyme Q-10 30 MG capsule Take 30 mg by mouth 2 (two) times daily.    Historical Provider, MD  furosemide (LASIX) 40 MG tablet Take 40 mg by mouth.    Historical Provider, MD  HYDROcodone-acetaminophen (NORCO/VICODIN) 5-325 MG per tablet Take 1-2 tablets by mouth every 4 (four) hours as needed for moderate pain. 06/02/14   Tanya Hazel, MD  LORazepam (ATIVAN) 2 MG tablet Take 2 mg by mouth 2 (two) times daily as needed for anxiety.     Historical Provider, MD  nebivolol (BYSTOLIC) 5 MG tablet Take 5 mg by mouth daily.      Historical Provider, MD  nitrofurantoin, macrocrystal-monohydrate, (MACROBID) 100 MG  capsule Take 100 mg by mouth.    Historical Provider, MD  thiamine (VITAMIN B-1) 100 MG tablet Take 100 mg by mouth daily.    Historical Provider, MD  trimethoprim-polymyxin b (POLYTRIM) ophthalmic solution Place 1 drop into both eyes every 4 (four) hours. 08/06/14   Truman Hayward, MD  TRIUMEQ 600-50-300 MG tablet TAKE 1 TABLET ONCE DAILY. 05/25/16   Truman Hayward, MD    Family History Family History  Problem Relation Age of Onset  . Diabetes Brother     Social History Social History  Substance Use Topics  . Smoking status: Never Smoker  . Smokeless tobacco: Never Used  . Alcohol use No     Allergies   Bee venom; Tenofovir disoproxil fumarate [tenofovir disoproxil fumarate]; Codeine; Haloperidol lactate; Haloperidol lactate; Morphine; Morphine sulfate; Navane [thiothixene]; Prochlorperazine; Prochlorperazine edisylate; Promethazine hcl; Promethazine hcl; Propoxyphene; and Propoxyphene n-acetaminophen   Review of Systems Review of Systems  Constitutional: Positive for chills and fever.  HENT: Negative for sore throat.   Respiratory: Positive for cough and shortness of breath.   Gastrointestinal: Positive for nausea. Negative for vomiting.  Genitourinary: Negative for difficulty urinating.  All other systems reviewed and are negative.  Physical Exam Updated Vital Signs BP 129/59 (BP Location: Right Arm)   Pulse 88   Temp 98.5 F (36.9 C) (Oral)   Resp 20   Wt 95.3 kg   SpO2 100%   BMI 37.80 kg/m   Physical Exam  Constitutional: She appears well-developed and well-nourished. No distress.  HENT:  Head: Normocephalic.  Right Ear: External ear normal.  Left Ear: External ear normal.  Well healed scar to the bridge of the nose  Eyes: Conjunctivae are normal. Right eye exhibits no discharge. Left eye exhibits no discharge. No scleral icterus.  Neck: Neck supple. No tracheal deviation present.  Cardiovascular: Normal rate, regular rhythm and intact distal  pulses.   Pulmonary/Chest: Effort normal and breath sounds normal. No stridor. No respiratory distress. She has no wheezes. She has no rales.  Abdominal: Soft. Bowel sounds are normal. She exhibits no distension. There is no tenderness. There is no rebound and no guarding.  Musculoskeletal: She exhibits no edema or tenderness.  Neurological: She is alert. She has normal strength. No cranial nerve deficit (no facial droop, extraocular movements intact, no slurred speech) or sensory deficit. She exhibits normal muscle tone. She displays no seizure activity. Coordination normal.  Skin: Skin is warm and dry. No rash noted.  Psychiatric: She has a normal mood and affect.  Nursing note and vitals reviewed.  ED Treatments / Results  Labs (all labs ordered are listed, but only abnormal results are displayed) Labs Reviewed  COMPREHENSIVE METABOLIC PANEL - Abnormal; Notable for the following:       Result Value   Glucose, Bld 125 (*)    BUN 24 (*)    Creatinine, Ser 1.41 (*)    Total Bilirubin 1.4 (*)    GFR calc non Af Amer 35 (*)    GFR calc Af Amer 41 (*)    All other components within normal limits  CBC WITH DIFFERENTIAL/PLATELET - Abnormal; Notable for the following:    MCV 105.8 (*)    MCH 36.7 (*)    Platelets 144 (*)    All other components within normal limits  URINALYSIS, ROUTINE W REFLEX MICROSCOPIC (NOT AT [facility]) - Abnormal; Notable for the following:    APPearance CLOUDY (*)    Protein, ur 100 (*)    Nitrite POSITIVE (*)    All other components within normal limits  URINE MICROSCOPIC-ADD ON - Abnormal; Notable for the following:    Squamous Epithelial / LPF 0-5 (*)    Bacteria, UA MANY (*)    All other components within normal limits  I-STAT CG4 LACTIC ACID, ED - Abnormal; Notable for the following:    Lactic Acid, Venous 2.69 (*)    All other components within normal limits  CULTURE, BLOOD (ROUTINE X 2)  CULTURE, BLOOD (ROUTINE X 2)  URINE CULTURE  I-STAT CG4 LACTIC ACID,  ED    Radiology Dg Chest 2 View  Result Date: 07/22/2016 CLINICAL DATA:  Fever and chills, cough. EXAM: CHEST  2 VIEW COMPARISON:  CT chest and chest radiograph 05/31/2014. FINDINGS: Trachea is midline. Heart size stable. There may be mild interstitial prominence, of unknown chronicity. No pleural fluid. IMPRESSION: Mild interstitial prominence is of unknown chronicity. Pulmonary edema or a viral process could have this appearance. Electronically Signed  By: Lorin Picket M.D.   On: 07/22/2016 09:12    Procedures Procedures  DIAGNOSTIC STUDIES: Oxygen Saturation is 93% on RA, adequate by my interpretation.    COORDINATION OF CARE: 8:13 AM Discussed next steps with pt. Pt verbalized understanding and is agreeable with the plan.    Medications Ordered in ED Medications  0.9 %  sodium chloride infusion (1,000 mLs Intravenous New Bag/Given 07/22/16 0909)    Followed by  0.9 %  sodium chloride infusion (1,000 mLs Intravenous New Bag/Given 07/22/16 0931)    Followed by  0.9 %  sodium chloride infusion (not administered)  cefTRIAXone (ROCEPHIN) 1 g in dextrose 5 % 50 mL IVPB (0 g Intravenous Stopped 07/22/16 0950)  azithromycin (ZITHROMAX) 500 mg in dextrose 5 % 250 mL IVPB (500 mg Intravenous New Bag/Given 07/22/16 0950)     Initial Impression / Assessment and Plan / ED Course  I have reviewed the triage vital signs and the nursing notes.  Pertinent labs & imaging results that were available during my care of the patient were reviewed by me and considered in my medical decision making (see chart for details).  Clinical Course  Comment By Time  Elevated lactic acid level >2, <4.  Patient is not hypotensive or tachycardic. Does not appear septic at this time. I have ordered a fluid bolus and we will continue to monitor closely.  I will order empiric abx Tanya Rank, MD 09/27 [phone]  Discussed findings with patient.  BP remains stable.  No new concerns.   Tanya Rank, MD 09/27 1051  The  patient's laboratory tests are notable for an abnormal urinalysis. This is consistent with a urinary tract infection. Patient also has an elevated lactic acid level that we will need to monitor. Chest x-ray does not show pneumonia.  Considering her comorbidities, her elevated lactic acid level and fever. I will consult with medical service for admission for monitoring and IV antibiotics.   I personally performed the services described in this documentation, which was scribed in my presence.  The recorded information has been reviewed and is accurate.  Final Clinical Impressions(s) / ED Diagnoses   Final diagnoses:  Fever, unspecified fever cause  UTI (lower urinary tract infection)  Elevated lactic acid level  Human immunodeficiency virus (HIV) disease (HCC)      Tanya Rank, MD 07/22/16 1052

## 2016-07-22 NOTE — ED Triage Notes (Signed)
Pt brought in by EMS for fever and chills 101.8. Pt reports she has not felt well for 2 days. Some coughing. Tylenol 1000 mg adm po at 0710. Pt is HIV positive

## 2016-07-23 DIAGNOSIS — N3000 Acute cystitis without hematuria: Secondary | ICD-10-CM | POA: Diagnosis not present

## 2016-07-23 DIAGNOSIS — B20 Human immunodeficiency virus [HIV] disease: Secondary | ICD-10-CM | POA: Diagnosis not present

## 2016-07-23 DIAGNOSIS — N183 Chronic kidney disease, stage 3 (moderate): Secondary | ICD-10-CM | POA: Diagnosis not present

## 2016-07-23 DIAGNOSIS — N390 Urinary tract infection, site not specified: Secondary | ICD-10-CM | POA: Diagnosis not present

## 2016-07-23 DIAGNOSIS — J189 Pneumonia, unspecified organism: Secondary | ICD-10-CM | POA: Diagnosis not present

## 2016-07-23 LAB — STREP PNEUMONIAE URINARY ANTIGEN: Strep Pneumo Urinary Antigen: NEGATIVE

## 2016-07-23 MED ORDER — CEFUROXIME AXETIL 500 MG PO TABS: 500.0000 mg | ORAL_TABLET | Freq: Two times a day (BID) | ORAL | 0 refills | Status: DC

## 2016-07-23 MED ORDER — AZITHROMYCIN 250 MG PO TABS: 250.0000 mg | ORAL_TABLET | Freq: Every day | ORAL | 0 refills | Status: DC

## 2016-07-23 MED ORDER — AZITHROMYCIN 250 MG PO TABS: 250.0000 mg | ORAL_TABLET | Freq: Every day | ORAL | Status: DC

## 2016-07-23 MED ORDER — NITROFURANTOIN MONOHYD MACRO 100 MG PO CAPS: 100.0000 mg | ORAL_CAPSULE | Freq: Two times a day (BID) | ORAL | Status: DC

## 2016-07-23 MED ORDER — CEFUROXIME AXETIL 250 MG PO TABS: 500.0000 mg | ORAL_TABLET | Freq: Two times a day (BID) | ORAL | Status: DC

## 2016-07-23 NOTE — Discharge Summary (Signed)
Physician Discharge Summary  [name] IDP:[id number] DOB: [birth year] DOA: 07/22/2016  PCP: Octavio Graves, DO  Admit date: 07/22/2016 Discharge date: 07/23/2016  Recommendations for Outpatient Follow-up:  1. Follow-up Resolution of UTI. Urine culture pending.   Follow-up Information    Tanya BUTLER, DO. Schedule an appointment as soon as possible for a visit in 1 week(s).   Contact information: [address] [place] [address] [phone]        Tanya Evener, MD .   Specialty:  Infectious Diseases Contact information: [address] [place] [address] [phone]          Discharge Diagnoses:  1. Gram-negative rod UTI 2. Possible pneumonia 3. HIV  2. COPD  3. CKD stage III   Discharge Condition: Stable  Disposition: Home   Diet recommendation: Regular   Filed Weights   07/22/16 0925 07/22/16 1325  Weight: 95.3 kg (210 lb) 99.3 kg (219 lb)    History of present illness:  43 yof with a history of HIV presented by the EMS with reported fever, cough, chills, shortness of breath, nausea. She was admitted for further evaluation of fever, possible pneumonia and UTI.  Hospital Course:  Patient was started on empiric antibiotics and had rapid clinical improvement. No recurrent fever. No evidence of serious infection. Urine culture shows greater than 100,000 colonies gram-negative rods. Because of abnormal chest x-ray findings and comorbidities, will treat both for UTI and pneumonia. Hemodynamics remain stable and she is stable for discharge.  1. Fever secondary to gram-negative rod UTI, cannot rule out occult pneumonia. No recurrent fever since admission. Hemodynamics stable. 2. HIV, controlled. Summary of records reviewed: Followed by Dr. [name], last seen July 2017; at that timeHIV RNA was undetectable and CD4 count was 800. Recommendation was to continue chronic medications. 3. COPD. Stable. She is on oxygen as needed.   Continue PRN bronchodilators.  4. CKD stage III.  5. Bilateral pedal neuropathy. Stable at this time.  6. Anxiety and depression. Noted.   Consultants:  None   Procedures:  None   Antimicrobials:  Rocephin 9/28 >> 9/29  Zithromax 9/28 >> 10/2  Ceftin 9/30 >> 10/2  Discharge Instructions  Discharge Instructions    Diet general    Complete by:  As directed    Discharge instructions    Complete by:  As directed    Call your physician or seek immediate medical attention for fever, shortness of breath, pain, vomiting or worsening of condition.   Increase activity slowly    Complete by:  As directed        Medication List    TAKE these medications   albuterol (2.5 MG/3ML) 0.083% nebulizer solution Commonly known as:  PROVENTIL Take 2.5 mg by nebulization every 6 (six) hours as needed for wheezing or shortness of breath.   albuterol 108 (90 Base) MCG/ACT inhaler Commonly known as:  PROVENTIL HFA;VENTOLIN HFA Inhale 2 puffs into the lungs every 6 (six) hours as needed. For shortness of breath   amitriptyline 50 MG tablet Commonly known as:  ELAVIL Take 50 mg by mouth at bedtime.   azithromycin 250 MG tablet Commonly known as:  ZITHROMAX Take 1 tablet (250 mg total) by mouth daily. Start 9/29. Please deliver to patient's house.   BENADRYL 25 mg capsule Generic drug:  diphenhydrAMINE Take 25 mg by mouth daily as needed for itching (itching from hydrocodone).   cefUROXime 500 MG tablet Commonly known as:  CEFTIN Take  1 tablet (500 mg total) by mouth 2 (two) times daily with a meal. Start 9/29. Please deliver to patient's house. Start taking on:  07/24/2016   co-enzyme Q-10 30 MG capsule Take 30 mg by mouth daily.   furosemide 40 MG tablet Commonly known as:  LASIX Take 40 mg by mouth.   HYDROcodone-acetaminophen 5-325 MG tablet Commonly known as:  NORCO/VICODIN Take 1-2 tablets by mouth every 4 (four) hours as needed for moderate pain.   LORazepam 2 MG  tablet Commonly known as:  ATIVAN Take 2 mg by mouth 2 (two) times daily as needed for anxiety.   nebivolol 5 MG tablet Commonly known as:  BYSTOLIC Take 10 mg by mouth daily.   nitrofurantoin (macrocrystal-monohydrate) 100 MG capsule Commonly known as:  MACROBID Take 1 capsule (100 mg total) by mouth 2 (two) times daily. Resume only after you have finished the new antibiotics. What changed:  additional instructions   ondansetron 8 MG tablet Commonly known as:  ZOFRAN Take 8 mg by mouth daily.   polyvinyl alcohol 1.4 % ophthalmic solution Commonly known as:  LIQUIFILM TEARS Place 1 drop into both eyes as needed for dry eyes.   thiamine 100 MG tablet Commonly known as:  VITAMIN B-1 Take 100 mg by mouth daily.   TRIUMEQ 600-50-300 MG tablet Generic drug:  abacavir-dolutegravir-lamiVUDine TAKE 1 TABLET ONCE DAILY.   VITAMIN B COMPLEX-C PO Take 1 capsule by mouth daily.   Vitamin D3 10000 units Tabs Take 1 tablet by mouth daily.      Allergies  Allergen Reactions  . Bee Venom Anaphylaxis  . Tenofovir Disoproxil Fumarate [Tenofovir Disoproxil Fumarate] Other (See Comments)    Renal failure, (08/27/2006)  . Codeine Itching and Nausea And Vomiting  . Haloperidol Lactate Other (See Comments)    Reaction is muscle tension, causes severe spasms in face and neck. Forces eyes to roll in the back of the head  . Haloperidol Lactate Nausea And Vomiting  . Morphine Swelling  . Morphine Sulfate Itching  . Navane [Thiothixene] Other (See Comments) and Nausea And Vomiting    Same muscle spasm reaction as haldol  . Prochlorperazine Nausea And Vomiting  . Prochlorperazine Edisylate Other (See Comments)    Same reaction as haldol   . Promethazine Hcl Other (See Comments)    Same as haldol   . Promethazine Hcl Swelling  . Propoxyphene Nausea And Vomiting  . Propoxyphene N-Acetaminophen Itching and Nausea And Vomiting    The results of significant diagnostics from this  hospitalization (including imaging, microbiology, ancillary and laboratory) are listed below for reference.    Significant Diagnostic Studies: Dg Chest 2 View  Result Date: 07/22/2016 CLINICAL DATA:  Fever and chills, cough. EXAM: CHEST  2 VIEW COMPARISON:  CT chest and chest radiograph 05/31/2014. FINDINGS: Trachea is midline. Heart size stable. There may be mild interstitial prominence, of unknown chronicity. No pleural fluid. IMPRESSION: Mild interstitial prominence is of unknown chronicity. Pulmonary edema or a viral process could have this appearance. Electronically Signed   By: Lorin Picket M.D.   On: 07/22/2016 09:12    Microbiology: Recent Results (from the past 240 hour(s))  Urine culture     Status: Abnormal (Preliminary result)   Collection Time: 07/22/16  8:30 AM  Result Value Ref Range Status   Specimen Description URINE, CATHETERIZED  Final   Special Requests NONE  Final   Culture (A)  Final    >=100,000 COLONIES/mL GRAM NEGATIVE RODS SUSCEPTIBILITIES TO FOLLOW Performed at [facility]  [facility]    Report Status PENDING  Incomplete  Blood culture (routine x 2)     Status: None (Preliminary result)   Collection Time: 07/22/16  8:40 AM  Result Value Ref Range Status   Specimen Description BLOOD RIGHT ANTECUBITAL  Final   Special Requests BOTTLES DRAWN AEROBIC AND ANAEROBIC 10CC EACH  Final   Culture NO GROWTH < 24 HOURS  Final   Report Status PENDING  Incomplete  Blood culture (routine x 2)     Status: None (Preliminary result)   Collection Time: 07/22/16  8:50 AM  Result Value Ref Range Status   Specimen Description BLOOD RIGHT HAND  Final   Special Requests BOTTLES DRAWN AEROBIC ONLY 10CC  Final   Culture NO GROWTH < 24 HOURS  Final   Report Status PENDING  Incomplete     Labs: Basic Metabolic Panel:  Recent Labs Lab 07/22/16 0826  NA 140  K 4.4  CL 104  CO2 28  GLUCOSE 125*  BUN 24*  CREATININE 1.41*  CALCIUM 9.3   Liver Function Tests:  Recent  Labs Lab 07/22/16 0826  AST 38  ALT 54  ALKPHOS 71  BILITOT 1.4*  PROT 6.8  ALBUMIN 4.0   CBC:  Recent Labs Lab 07/22/16 0826  WBC 6.5  NEUTROABS 5.4  HGB 14.5  HCT 41.8  MCV 105.8*  PLT 144*    Principal Problem:   Fever Active Problems:   Human immunodeficiency virus (HIV) disease (HCC)   HIV-1 associated autonomic neuropathy (HCC)   Acute cystitis   CKD (chronic kidney disease) stage 3, GFR 30-59 ml/min   CAP (community acquired pneumonia)   Time coordinating discharge: 35 minutes   Signed:  Murray Hodgkins, MD [facility] 07/23/2016, 2:08 PM  By signing my name below, I, Collene Leyden, attest that this documentation has been prepared under the direction and in the presence of Murray Hodgkins, MD. Electronically signed: Collene Leyden, Scribe. 07/23/16   I personally performed the services described in this documentation. All medical record entries made by the scribe were at my direction. I have reviewed the chart and agree that the record reflects my personal performance and is accurate and complete. Murray Hodgkins, MD

## 2016-07-23 NOTE — Care Management Note (Signed)
Case Management Note  Patient Details  Name: [name] MRN: [id number] Date of Birth: 1941/05/30  Subjective/Objective: Patient adm from home with fever and pneumonia. She lives with a roommate who is a support for her. She has a PCP and still drives. She has a neighbor who drives her to appointments also if needed. She has insurance, reports no issues.  She does ask about the possibility of getting a BSC.            Action/Plan: Romualdo Bolk notified about possibility of BSC. Will follow up.    Expected Discharge Date:  07/24/16               Expected Discharge Plan:  Home/Self Care  In-House Referral:  NA  Discharge planning Services  CM Consult  Post Acute Care Choice:  Durable Medical Equipment Choice offered to:  Patient  DME Arranged:  3-N-1 DME Agency:     HH Arranged:    [place] Agency:     Status of Service:     If discussed at [employer] of Stay Meetings, dates discussed:    Additional Comments:  Zeina Akkerman, Chauncey Reading, RN 07/23/2016, 12:39 PM

## 2016-07-23 NOTE — Progress Notes (Signed)
Patient states understanding of discharge instructions.  

## 2016-07-23 NOTE — Care Management Note (Signed)
Case Management Note  Patient Details  Name: [name] MRN: [id number] Date of Birth: 02-02-1941    Expected Discharge Date:  07/24/16               Expected Discharge Plan:  Home/Self Care  In-House Referral:  NA  Discharge planning Services  CM Consult  Post Acute Care Choice:  Durable Medical Equipment Choice offered to:  Patient  DME Arranged:  3-N-1 DME Agency:     HH Arranged:    [place] Agency:     Status of Service:     If discussed at [place] of Stay Meetings, dates discussed:    Additional Comments: Patient has received a 3 in 1 in 2015 and is not eligible to get another unless she pays $52, she does not want to pay at this time. No other CM needs. Patient discharging home.   Melitza Metheny, Chauncey Reading, RN 07/23/2016, 2:32 PM

## 2016-07-23 NOTE — Progress Notes (Signed)
PROGRESS NOTE  [name] HCW:[id number] DOB: [birth year] DOA: 07/22/2016 PCP: Octavio Graves, DO  Brief Narrative: 85 yof with a history of HIV presented by the EMS with reported fever, cough, chills, shortness of breath, nausea. She was admitted for further evaluation of fever, possible pneumonia and UTI.  Assessment/Plan: 1. Fever secondary to gram-negative rod UTI, cannot rule out occult pneumonia. No recurrent fever since admission. Hemodynamics stable. 2. HIV, controlled. Summary of records reviewed: Followed by Dr. [name], last seen July 2017; at that timeHIV RNA was undetectable and CD4 count was 800. Recommendation was to continue chronic medications. 3. COPD. Stable. She is on oxygen as needed.  Continue PRN bronchodilators.  4. CKD stage III.  5. Bilateral pedal neuropathy. Stable at this time.  6. Anxiety and depression. Noted.    She has overall improved. She has been afebrile for 24 hours. Will transition her to oral antibiotics and discharge home.  Follow-up urine cultures in outpatient.  DVT prophylaxis: SCDs  Code Status: Full  Family Communication: No family bedside Disposition Plan: Discharge home once improved.   Murray Hodgkins, MD  [facility] Direct contact: [phone] --Via amion app OR  --[email or web address]; password [email or web address]  7PM-7AM contact night coverage as above 07/23/2016, 2:02 PM  LOS: 0 days   Consultants:  None   Procedures:  None   Antimicrobials:  Rocephin 9/28 >>  Zithromax 9/28 >>  CC: Follow-up fever   Interval history/Subjective: Eating well. Overall feeling better. Denies any SOB, painful urination, and coughing.   ROS: No vomiting.   Objective: Vitals:   07/22/16 1341 07/22/16 1500 07/22/16 1940 07/23/16 0508  BP: 112/61 (!) 118/58 (!) 125/55 (!) 181/87  Pulse:   67 71  Resp:   20 18  Temp:   98.8 F (37.1 C) 98.2 F (36.8 C)  TempSrc:   Oral Oral  SpO2:   100% 98%  Weight:      Height:         Intake/Output Summary (Last 24 hours) at 07/23/16 1402 Last data filed at 07/23/16 1241  Gross per 24 hour  Intake          1444.58 ml  Output              300 ml  Net          1144.58 ml     Filed Weights   07/22/16 0925 07/22/16 1325  Weight: 95.3 kg (210 lb) 99.3 kg (219 lb)    Exam:  Constitutional:  . Appears calm and comfortable  ENMT:  . external ears, nose appear normal . grossly normal hearing  . Lips appear normal;  Respiratory:  . CTA bilaterally, no w/r/r.  . Respiratory effort normal. No retractions or accessory muscle use Cardiovascular:  . RRR, no m/r/g . No LE extremity edema   Abdomen:   Abdomen appears soft, non tender, and non distended.  Musculoskeletal:   Moves all extremities to command.  Psychiatric:   Mood, affect appropriate  I have personally reviewed following labs and imaging studies:  Urine culture gram-negative rods  Strep pneumococcus antigen negative.  Scheduled Meds: . abacavir-dolutegravir-lamiVUDine  1 tablet Oral Daily  . amitriptyline  50 mg Oral QHS  . azithromycin  500 mg Intravenous Q24H  . cefTRIAXone (ROCEPHIN)  IV  2 g Intravenous Q24H  . enoxaparin (LOVENOX) injection  40 mg Subcutaneous Q24H  . furosemide  40 mg Oral Daily  . nebivolol  10 mg Oral Daily  . sodium chloride  flush  3 mL Intravenous Q12H   Continuous Infusions:   Principal Problem:   Fever Active Problems:   Human immunodeficiency virus (HIV) disease (HCC)   HIV-1 associated autonomic neuropathy (HCC)   Acute cystitis   CKD (chronic kidney disease) stage 3, GFR 30-59 ml/min   CAP (community acquired pneumonia)   LOS: 0 days         By signing my name below, I, Collene Leyden, attest that this documentation has been prepared under the direction and in the presence of Murray Hodgkins, MD. Electronically signed: Collene Leyden, Scribe. 07/23/16 10:28 AM   I personally performed the services described in this documentation. All  medical record entries made by the scribe were at my direction. I have reviewed the chart and agree that the record reflects my personal performance and is accurate and complete. Murray Hodgkins, MD

## 2016-07-23 NOTE — Care Management Obs Status (Signed)
[place] NOTIFICATION   Patient Details  Name: SMT. LODER MRN: [id number] Date of Birth: 1941-09-22   Medicare Observation Status Notification Given:  Yes    Desera Graffeo, Chauncey Reading, RN 07/23/2016, 12:51 PM

## 2016-07-24 LAB — URINE CULTURE: Culture: >=100000 — AB

## 2016-07-25 ENCOUNTER — Telehealth (HOSPITAL_COMMUNITY): Payer: Self-pay | Admitting: Emergency Medicine

## 2016-07-25 NOTE — Telephone Encounter (Signed)
Pt discharged on Ceftin.  Urine culture, e choli sensitive to cephalosporin

## 2016-07-27 LAB — CULTURE, BLOOD (ROUTINE X 2)
Culture: NO GROWTH
Culture: NO GROWTH

## 2016-08-06 ENCOUNTER — Encounter (HOSPITAL_COMMUNITY): Payer: Self-pay | Admitting: Emergency Medicine

## 2016-08-06 ENCOUNTER — Emergency Department (HOSPITAL_COMMUNITY): Payer: Medicare Other

## 2016-08-06 ENCOUNTER — Emergency Department (HOSPITAL_COMMUNITY)
Admission: EM | Admit: 2016-08-06 | Discharge: 2016-08-06 | Disposition: A | Discharge status: Home / Self Care | Payer: Medicare Other | Attending: Emergency Medicine | Admitting: Emergency Medicine

## 2016-08-06 DIAGNOSIS — I129 Hypertensive chronic kidney disease with stage 1 through stage 4 chronic kidney disease, or unspecified chronic kidney disease: Secondary | ICD-10-CM | POA: Diagnosis not present

## 2016-08-06 DIAGNOSIS — M25511 Pain in right shoulder: Secondary | ICD-10-CM | POA: Diagnosis present

## 2016-08-06 DIAGNOSIS — Z79899 Other long term (current) drug therapy: Secondary | ICD-10-CM | POA: Diagnosis not present

## 2016-08-06 DIAGNOSIS — J449 Chronic obstructive pulmonary disease, unspecified: Secondary | ICD-10-CM | POA: Insufficient documentation

## 2016-08-06 DIAGNOSIS — N183 Chronic kidney disease, stage 3 (moderate): Secondary | ICD-10-CM | POA: Insufficient documentation

## 2016-08-06 DIAGNOSIS — M5412 Radiculopathy, cervical region: Secondary | ICD-10-CM | POA: Insufficient documentation

## 2016-08-06 MED ORDER — OXYCODONE-ACETAMINOPHEN 5-325 MG PO TABS
1.0000 | ORAL_TABLET | Freq: Once | ORAL | Status: AC
  Administered 2016-08-06: 1 via ORAL
  Filled 2016-08-06: qty 1

## 2016-08-06 MED ORDER — METHOCARBAMOL 500 MG PO TABS: 500.0000 mg | ORAL_TABLET | Freq: Three times a day (TID) | ORAL | 0 refills | Status: DC

## 2016-08-06 MED ORDER — NAPROXEN 375 MG PO TABS: 375.0000 mg | ORAL_TABLET | Freq: Two times a day (BID) | ORAL | 0 refills | Status: DC

## 2016-08-06 NOTE — ED Triage Notes (Signed)
Pt reports R arm and neck pain that started 6 months ago. Pt states she is being seen by a Dr. and has been receiving injections.

## 2016-08-06 NOTE — Discharge Instructions (Signed)
Apply ice packs on/off to your neck and shoulder.  Call Dr. Ruthe Mannan office to arrange a follow-up appt

## 2016-08-09 NOTE — ED Provider Notes (Signed)
[place] DEPT Provider Note   CSN: [id number] Arrival date & time: 08/06/16  1050     History   Chief Complaint Chief Complaint  Patient presents with  . Arm Pain    HPI [name] is a 75 y.o. female.  HPI   [name] is a 75 y.o. female who presents to the Emergency Department complaining of acute on chronic right arm and neck pain.  She reports having this pain for months and sees her PMD for same and receives steroid injections with reported minimal relief.  She describes a aching pain to her right neck that is worse with palpation and movement.  She denies swelling, redness, fever, numbness or weakness.    Past Medical History:  Diagnosis Date  . Anxiety   . Arthritis   . Bronchitis   . CKD (chronic kidney disease) stage 3, GFR 30-59 ml/min 05/04/2016  . COPD (chronic obstructive pulmonary disease) ([place])   . Depression   . Elevated LFTs 05/04/2016  . History of TIAs 1981   left side weakness  . HIV (human immunodeficiency virus infection) ([place])   . Hypertension   . Kidney disease related to HIV infection [facility])    pt states she has to have her creatinine level checked often   . Peripheral neuropathy (HCC)    bilateral feet    Patient Active Problem List   Diagnosis Date Noted  . Fever 07/22/2016  . CAP (community acquired pneumonia) 07/22/2016  . CKD (chronic kidney disease) stage 3, GFR 30-59 ml/min 05/04/2016  . Elevated LFTs 05/04/2016  . 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 Breath shortness 06/19/2014  . Syncope 05/31/2014  . Rib fracture 05/31/2014  . Syncope and collapse 05/31/2014  . Weight gain 03/21/2014  . Cervical spondylosis 08/15/2013  . Neck pain on left side 05/24/2013  . Unspecified vitamin D deficiency 12/14/2012  . Vitamin D toxicity 12/14/2012  . Hemorrhoid 09/01/2012  . Myalgia 02/25/2012  . HTN (hypertension) 11/25/2011  . Cramps, muscle, general 08/18/2011  . RECTAL BLEEDING 11/03/2010  . DYSPHAGIA 11/03/2010  .  FATIGUE 08/29/2010  . MUSCLE PAIN 06/04/2010  . Acute cystitis 05/12/2010  . RENAL INSUFFICIENCY 12/18/2008  . DEPRESSION 09/04/2008  . HYPERLIPIDEMIA 05/22/2008  . IRRITABLE BOWEL SYNDROME 05/22/2008  . HERNIATED CERVICAL DISC 10/05/2007  . HERNIATED LUMBAR DISC 10/05/2007  . PERSONAL HISTORY OF URINARY TRACT INFECTION 09/20/2007  . HIV-1 associated autonomic neuropathy ([place]) 03/16/2007  . ELBOW PAIN 03/16/2007  . ISCHEMIC COLITIS 12/27/2006  . RENAL FAILURE NOS 12/27/2006  . Human immunodeficiency virus (HIV) disease ([place]) 08/27/2006    Past Surgical History:  Procedure Laterality Date  . ABDOMINAL HYSTERECTOMY  1985  . BACK SURGERY    . CATARACT EXTRACTION, BILATERAL    . CHOLECYSTECTOMY    . COLONOSCOPY  08/10/2002   NUR: Normal colonoscopy except for some hemorrhoids and some erythema at the dentate line  . COLONOSCOPY  12/10/2006   Edwards:Diffuse colitis in the transverse and descending colon/This is not entirely typical of ischemic colitis or her HIV positive/ disease.  We need to be concerned about other causes  . COLONOSCOPY  01/19/2011   RMR: Internal and external hemorrhoids, likely source of hematochezia anal papilla, otherwise normal rectal mucosa/ Left-sided diverticula.  Cecal polyp with status post cold snare polypectomy.  Remainder of colonic mucosa appeared normal  . ESOPHAGOGASTRODUODENOSCOPY  08/10/2002     NUR: Mild changes of reflux esophagitis limited to gastroesophageal junction  No evidence of ring, stricture, or  esophageal candidiasis dysphagia/ Gastritis, possibly H. pylori induced  . ESOPHAGOGASTRODUODENOSCOPY  12/25/2002   NUR: Erosive antral gastritis.  The degree of gastritis is more significant than her last exam/ Normal examination of the esophagus/ Esophageal dilatation performed by passing 64 and 30 [place]  . ESOPHAGOGASTRODUODENOSCOPY  11/13/2010   RMR: Circumferential distal esophageal erosions with soft peptic stricture, consistent with  erosive reflux esophagitis or stricture  formation, status post dilation as described above/  Small hiatal hernia/ Tiny antral erosions, otherwise normal stomach D1 and D2  . HEMORRHOID SURGERY  09/02/2012   Procedure: HEMORRHOIDECTOMY;  Surgeon: Jamesetta So, MD;  Location: [facility];  Service: General;  Laterality: N/A;  . TUBAL LIGATION      OB History    Gravida Para Term Preterm AB Living   4 3 2 1 1 3    SAB TAB Ectopic Multiple Live Births   1               Home Medications    Prior to Admission medications   Medication Sig Start Date End Date Taking? Authorizing Provider  albuterol (PROVENTIL HFA;VENTOLIN HFA) 108 (90 BASE) MCG/ACT inhaler Inhale 2 puffs into the lungs every 6 (six) hours as needed. For shortness of breath    Historical Provider, MD  albuterol (PROVENTIL) (2.5 MG/3ML) 0.083% nebulizer solution Take 2.5 mg by nebulization every 6 (six) hours as needed for wheezing or shortness of breath.    Historical Provider, MD  amitriptyline (ELAVIL) 50 MG tablet Take 50 mg by mouth at bedtime.     Historical Provider, MD  azithromycin (ZITHROMAX) 250 MG tablet Take 1 tablet (250 mg total) by mouth daily. Start 9/29. Please deliver to patient's house. 07/23/16   Samuella Cota, MD  cefUROXime (CEFTIN) 500 MG tablet Take 1 tablet (500 mg total) by mouth 2 (two) times daily with a meal. Start 9/29. Please deliver to patient's house. 07/24/16   Samuella Cota, MD  Cholecalciferol (VITAMIN D3) 10000 units TABS Take 1 tablet by mouth daily.    Historical Provider, MD  co-enzyme Q-10 30 MG capsule Take 30 mg by mouth daily.     Historical Provider, MD  diphenhydrAMINE (BENADRYL) 25 mg capsule Take 25 mg by mouth daily as needed for itching (itching from hydrocodone).    Historical Provider, MD  furosemide (LASIX) 40 MG tablet Take 40 mg by mouth.    Historical Provider, MD  HYDROcodone-acetaminophen (NORCO/VICODIN) 5-325 MG per tablet Take 1-2 tablets by mouth every 4 (four) hours  as needed for moderate pain. 06/02/14   [name], MD  LORazepam (ATIVAN) 2 MG tablet Take 2 mg by mouth 2 (two) times daily as needed for anxiety.     Historical Provider, MD  methocarbamol (ROBAXIN) 500 MG tablet Take 1 tablet (500 mg total) by mouth 3 (three) times daily. 08/06/16   Wava Kildow, PA-C  naproxen (NAPROSYN) 375 MG tablet Take 1 tablet (375 mg total) by mouth 2 (two) times daily with a meal. 08/06/16   Adrienne Trombetta, PA-C  nebivolol (BYSTOLIC) 5 MG tablet Take 10 mg by mouth daily.     Historical Provider, MD  nitrofurantoin, macrocrystal-monohydrate, (MACROBID) 100 MG capsule Take 1 capsule (100 mg total) by mouth 2 (two) times daily. Resume only after you have finished the new antibiotics. 07/23/16   Samuella Cota, MD  ondansetron (ZOFRAN) 8 MG tablet Take 8 mg by mouth daily.    Historical Provider, MD  polyvinyl alcohol (LIQUIFILM TEARS)  1.4 % ophthalmic solution Place 1 drop into both eyes as needed for dry eyes.    Historical Provider, MD  thiamine (VITAMIN B-1) 100 MG tablet Take 100 mg by mouth daily.    Historical Provider, MD  TRIUMEQ 600-50-300 MG tablet TAKE 1 TABLET ONCE DAILY. 05/25/16   Truman Hayward, MD  VITAMIN B COMPLEX-C PO Take 1 capsule by mouth daily.    Historical Provider, MD    Family History Family History  Problem Relation Age of Onset  . Diabetes Brother     Social History Social History  Substance Use Topics  . Smoking status: Never Smoker  . Smokeless tobacco: Never Used  . Alcohol use No     Allergies   Bee venom; Tenofovir disoproxil fumarate [tenofovir disoproxil fumarate]; Codeine; Haloperidol lactate; Haloperidol lactate; Morphine; Morphine sulfate; Navane [thiothixene]; Prochlorperazine; Prochlorperazine edisylate; Promethazine hcl; Promethazine hcl; Propoxyphene; and Propoxyphene n-acetaminophen   Review of Systems Review of Systems  Constitutional: Negative for chills and fever.  Respiratory: Negative for chest  tightness and shortness of breath.   Cardiovascular: Negative for chest pain.  Gastrointestinal: Negative for abdominal pain.  Genitourinary: Negative for difficulty urinating and dysuria.  Musculoskeletal: Positive for neck pain. Negative for arthralgias and joint swelling.  Skin: Negative for color change and wound.  Neurological: Negative for dizziness, weakness and numbness.  All other systems reviewed and are negative.    Physical Exam Updated Vital Signs BP 173/74 (BP Location: Left Arm)   Pulse 62   Temp 97.8 F (36.6 C) (Oral)   Resp 18   Ht 5\' 4"  (1.626 m)   Wt 99.3 kg   SpO2 96%   BMI 37.59 kg/m   Physical Exam  Constitutional: She is oriented to person, place, and time. She appears well-developed and well-nourished. No distress.  HENT:  Head: Normocephalic and atraumatic.  Mouth/Throat: Oropharynx is clear and moist.  Eyes: EOM are normal. Pupils are equal, round, and reactive to light.  Neck: Phonation normal. Muscular tenderness present. No spinous process tenderness present. No neck rigidity. Decreased range of motion present. No erythema present. No Brudzinski's sign and no Kernig's sign noted. No thyromegaly present.  Cardiovascular: Normal rate, regular rhythm and intact distal pulses.   Pulmonary/Chest: Effort normal and breath sounds normal. No respiratory distress. She exhibits no tenderness.  Musculoskeletal: She exhibits tenderness. She exhibits no edema.       Cervical back: She exhibits tenderness. She exhibits normal range of motion, no bony tenderness, no swelling, no deformity, no spasm and normal pulse.  ttp of the right cervical paraspinal muscles and along the right trapezius muscle.  Grip strength is strong and equal bilaterally.  ttp of the lateral epicondyle of the right elbow. Distal sensation intact,  CR < 2 sec.     Lymphadenopathy:    She has no cervical adenopathy.  Neurological: She is alert and oriented to person, place, and time. She has  normal strength. No sensory deficit. She exhibits normal muscle tone. Coordination normal.  Reflex Scores:      Tricep reflexes are 2+ on the right side and 2+ on the left side.      Bicep reflexes are 2+ on the right side and 2+ on the left side. Skin: Skin is warm and dry.  Nursing note and vitals reviewed.    ED Treatments / Results  Labs (all labs ordered are listed, but only abnormal results are displayed) Labs Reviewed - No data to display  EKG  EKG Interpretation None       Radiology Dg Chest 2 View  Result Date: 07/22/2016 CLINICAL DATA:  Fever and chills, cough. EXAM: CHEST  2 VIEW COMPARISON:  CT chest and chest radiograph 05/31/2014. FINDINGS: Trachea is midline. Heart size stable. There may be mild interstitial prominence, of unknown chronicity. No pleural fluid. IMPRESSION: Mild interstitial prominence is of unknown chronicity. Pulmonary edema or a viral process could have this appearance. Electronically Signed   By: Lorin Picket M.D.   On: 07/22/2016 09:12   Dg Shoulder Right  Result Date: 08/06/2016 CLINICAL DATA:  Right upper humerus pain which is intermittent. No known injury. EXAM: RIGHT SHOULDER - 2+ VIEW COMPARISON:  None. FINDINGS: No evidence of fracture, dislocation or focal bone lesion. Mild osteoarthritis of the AC joint. Small subacromial spur. Findings could possibly relate shoulder pain and/or rotator cuff disease. They could also be asymptomatic. IMPRESSION: Mild degenerative disease of the [facility] joint. Small subacromial spur. See above. Electronically Signed   By: Nelson Chimes M.D.   On: 08/06/2016 12:12   Ct Cervical Spine Wo Contrast  Result Date: 08/06/2016 CLINICAL DATA:  75 year old female with history of right-sided arm pain for the past 6 months and right-sided neck pain since 08/05/2016. No history of injury. EXAM: CT CERVICAL SPINE WITHOUT CONTRAST TECHNIQUE: Multidetector CT imaging of the cervical spine was performed without intravenous  contrast. Multiplanar CT image reconstructions were also generated. COMPARISON:  Head CT and cervical spine CT 05/31/2014. FINDINGS: Alignment: 3 mm of anterolisthesis of C4 upon C5, which appears unchanged compared to prior study 05/31/2014. Mild straightening of normal cervical lordosis, likely positional. Alignment is otherwise anatomic. Skull base and vertebrae: No acute fracture. No primary bone lesion or focal pathologic process. Soft tissues and spinal canal: No prevertebral fluid or swelling. No visible canal hematoma. Disc levels: Multilevel degenerative disc disease, most severe at C5-C6 and C6-C7. Moderate multilevel facet arthropathy. Upper chest: Visualized portions of the upper thorax are unremarkable. Other: None. IMPRESSION: 1. No acute abnormality of the cervical spine to account for the patient's symptoms. 2. There is, however, multilevel degenerative disc disease and cervical spondylosis, as above. Electronically Signed   By: Vinnie Langton M.D.   On: 08/06/2016 12:18     Procedures Procedures (including critical care time)  Medications Ordered in ED Medications  oxyCODONE-acetaminophen (PERCOCET/ROXICET) 5-325 MG per tablet 1 tablet (1 tablet Oral Given 08/06/16 1210)     Initial Impression / Assessment and Plan / ED Course  I have reviewed the triage vital signs and the nursing notes.  Pertinent labs & imaging results that were available during my care of the patient were reviewed by me and considered in my medical decision making (see chart for details).  Clinical Course   Pt feeling better after medications.  No focal neuro deficits on exam.  CT results discussed and she agrees to PMD f/u. No concerning sx's for septic joint.  NV intact  Final Clinical Impressions(s) / ED Diagnoses   Final diagnoses:  Acute pain of right shoulder  Cervical radiculopathy    New Prescriptions Discharge Medication List as of 08/06/2016 12:35 PM    START taking these medications     Details  methocarbamol (ROBAXIN) 500 MG tablet Take 1 tablet (500 mg total) by mouth 3 (three) times daily., Starting Thu 08/06/2016, Print    naproxen (NAPROSYN) 375 MG tablet Take 1 tablet (375 mg total) by mouth 2 (two) times daily with a meal., Starting Thu 08/06/2016, Print  Kem Parkinson, PA-C 08/09/16 1227    Merrily Pew, MD 08/10/16 [phone]

## 2016-09-22 ENCOUNTER — Other Ambulatory Visit: Payer: Self-pay | Admitting: Infectious Disease

## 2016-09-22 DIAGNOSIS — B20 Human immunodeficiency virus [HIV] disease: Secondary | ICD-10-CM

## 2017-01-15 ENCOUNTER — Other Ambulatory Visit: Payer: Medicare Other

## 2017-01-18 ENCOUNTER — Other Ambulatory Visit: Payer: Medicare Other

## 2017-01-18 DIAGNOSIS — B20 Human immunodeficiency virus [HIV] disease: Secondary | ICD-10-CM

## 2017-01-18 LAB — COMPLETE METABOLIC PANEL WITH GFR
ALT: 81 U/L — ABNORMAL HIGH (ref 6–29)
AST: 81 U/L — AB (ref 10–35)
Albumin: 4.1 g/dL (ref 3.6–5.1)
Alkaline Phosphatase: 60 U/L (ref 33–130)
BUN: 26 mg/dL — AB (ref 7–25)
CHLORIDE: 103 mmol/L (ref 98–110)
CO2: 26 mmol/L (ref 20–31)
Calcium: 10.9 mg/dL — ABNORMAL HIGH (ref 8.6–10.4)
Creat: 1.97 mg/dL — ABNORMAL HIGH (ref 0.60–0.93)
GFR, EST AFRICAN AMERICAN: 28 mL/min — AB (ref >=60)
GFR, Est Non African American: 24 mL/min — ABNORMAL LOW (ref >=60)
Glucose, Bld: 108 mg/dL — ABNORMAL HIGH (ref 65–99)
POTASSIUM: 4 mmol/L (ref 3.5–5.3)
Sodium: 140 mmol/L (ref 135–146)
Total Bilirubin: 0.7 mg/dL (ref 0.2–1.2)
Total Protein: 6.6 g/dL (ref 6.1–8.1)

## 2017-01-18 LAB — CBC WITH DIFFERENTIAL/PLATELET
BASOS PCT: 0 %
Basophils Absolute: 0 cells/uL (ref 0–200)
Eosinophils Absolute: 297 cells/uL (ref 15–500)
Eosinophils Relative: 3 %
HEMATOCRIT: 43.4 % (ref 35.0–45.0)
HEMOGLOBIN: 14.9 g/dL (ref 11.7–15.5)
LYMPHS ABS: 3960 {cells}/uL — AB (ref 850–3900)
Lymphocytes Relative: 40 %
MCH: 35.7 pg — AB (ref 27.0–33.0)
MCHC: 34.3 g/dL (ref 32.0–36.0)
MCV: 104.1 fL — AB (ref 80.0–100.0)
MONO ABS: 891 {cells}/uL (ref 200–950)
MPV: 9.4 fL (ref 7.5–12.5)
Monocytes Relative: 9 %
Neutro Abs: 4752 cells/uL (ref 1500–7800)
Neutrophils Relative %: 48 %
PLATELETS: 207 10*3/uL (ref 140–400)
RBC: 4.17 MIL/uL (ref 3.80–5.10)
RDW: 14.3 % (ref 11.0–15.0)
WBC: 9.9 10*3/uL (ref 3.8–10.8)

## 2017-01-19 LAB — T-HELPER CELL (CD4) - (RCID CLINIC ONLY)
CD4 T CELL ABS: 990 /uL (ref 400–2700)
CD4 T CELL HELPER: 24 % — AB (ref 33–55)

## 2017-01-20 LAB — HIV-1 RNA QUANT-NO REFLEX-BLD
HIV 1 RNA Quant: <20 copies/mL
HIV-1 RNA Quant, Log: <1.3 Log copies/mL

## 2017-01-27 ENCOUNTER — Other Ambulatory Visit: Payer: Self-pay | Admitting: Infectious Disease

## 2017-01-27 DIAGNOSIS — B20 Human immunodeficiency virus [HIV] disease: Secondary | ICD-10-CM

## 2017-03-03 ENCOUNTER — Encounter: Payer: Self-pay | Admitting: Infectious Disease

## 2017-03-03 ENCOUNTER — Ambulatory Visit (INDEPENDENT_AMBULATORY_CARE_PROVIDER_SITE_OTHER): Payer: Medicare Other | Admitting: Infectious Disease

## 2017-03-03 DIAGNOSIS — B20 Human immunodeficiency virus [HIV] disease: Secondary | ICD-10-CM

## 2017-03-03 DIAGNOSIS — N183 Chronic kidney disease, stage 3 unspecified: Secondary | ICD-10-CM

## 2017-03-03 DIAGNOSIS — N3000 Acute cystitis without hematuria: Secondary | ICD-10-CM

## 2017-03-03 DIAGNOSIS — Z79899 Other long term (current) drug therapy: Secondary | ICD-10-CM

## 2017-03-03 DIAGNOSIS — N390 Urinary tract infection, site not specified: Secondary | ICD-10-CM | POA: Insufficient documentation

## 2017-03-03 DIAGNOSIS — R110 Nausea: Secondary | ICD-10-CM

## 2017-03-03 DIAGNOSIS — G909 Disorder of the autonomic nervous system, unspecified: Secondary | ICD-10-CM

## 2017-03-03 DIAGNOSIS — Z87440 Personal history of urinary (tract) infections: Secondary | ICD-10-CM

## 2017-03-03 DIAGNOSIS — R634 Abnormal weight loss: Secondary | ICD-10-CM

## 2017-03-03 DIAGNOSIS — Z113 Encounter for screening for infections with a predominantly sexual mode of transmission: Secondary | ICD-10-CM

## 2017-03-03 DIAGNOSIS — R112 Nausea with vomiting, unspecified: Secondary | ICD-10-CM | POA: Insufficient documentation

## 2017-03-03 HISTORY — DX: Urinary tract infection, site not specified: N39.0

## 2017-03-03 HISTORY — DX: Abnormal weight loss: R63.4

## 2017-03-03 HISTORY — DX: Nausea: R11.0

## 2017-03-03 LAB — COMPLETE METABOLIC PANEL WITH GFR
ALBUMIN: 4.5 g/dL (ref 3.6–5.1)
ALK PHOS: 59 U/L (ref 33–130)
ALT: 56 U/L — ABNORMAL HIGH (ref 6–29)
AST: 59 U/L — AB (ref 10–35)
BILIRUBIN TOTAL: 0.8 mg/dL (ref 0.2–1.2)
BUN: 26 mg/dL — ABNORMAL HIGH (ref 7–25)
CALCIUM: 10.3 mg/dL (ref 8.6–10.4)
CO2: 24 mmol/L (ref 20–31)
Chloride: 101 mmol/L (ref 98–110)
Creat: 2.11 mg/dL — ABNORMAL HIGH (ref 0.60–0.93)
GFR, EST AFRICAN AMERICAN: 26 mL/min — AB (ref >=60)
GFR, EST NON AFRICAN AMERICAN: 22 mL/min — AB (ref >=60)
Glucose, Bld: 115 mg/dL — ABNORMAL HIGH (ref 65–99)
Potassium: 4.1 mmol/L (ref 3.5–5.3)
SODIUM: 139 mmol/L (ref 135–146)
Total Protein: 7 g/dL (ref 6.1–8.1)

## 2017-03-03 MED ORDER — CEPHALEXIN 500 MG PO CAPS: 500.0000 mg | ORAL_CAPSULE | Freq: Two times a day (BID) | ORAL | 1 refills | Status: DC

## 2017-03-03 NOTE — Progress Notes (Signed)
Subjective:   Chief complaint: dysuria, suprapubic pain, nausea and weight loss   :  Patient ID: [name], female    DOB: 1941/08/06, 76 y.o.   MRN: [id number]  HPI  76 year old Caucasian female with HIV currently well controlled on TRIUMEQ  Her viral load was undetectable and cd4 count healthy.  Lab Results  Component Value Date   HIV1RNAQUANT <20 NOT DETECTED 01/18/2017   HIV1RNAQUANT <20 05/04/2016   HIV1RNAQUANT <20 11/13/2015    Lab Results  Component Value Date   CD4TABS 990 01/18/2017   CD4TABS 800 05/04/2016   CD4TABS 840 11/13/2015    She has had nausea for past 1-2 months with weight loss but no clear cut cause. PCP is aware.  She has had one week of dysuria, and suprapubic pain. She is on chronic macrobid which I dont think she should be on given prior organism isolated from urine was macrobid R and this drug is contraindicated in CKD.    Past Medical History:  Diagnosis Date  . Anxiety   . Arthritis   . Bronchitis   . CKD (chronic kidney disease) stage 3, GFR 30-59 ml/min 05/04/2016  . COPD (chronic obstructive pulmonary disease) ([place])   . Depression   . Elevated LFTs 05/04/2016  . History of TIAs 1981   left side weakness  . HIV (human immunodeficiency virus infection) ([place])   . Hypertension   . Kidney disease related to HIV infection [facility])    pt states she has to have her creatinine level checked often   . Nausea 03/03/2017  . Peripheral neuropathy    bilateral feet  . UTI (urinary tract infection) 03/03/2017  . Weight loss 03/03/2017    Past Surgical History:  Procedure Laterality Date  . ABDOMINAL HYSTERECTOMY  1985  . BACK SURGERY    . CATARACT EXTRACTION, BILATERAL    . CHOLECYSTECTOMY    . COLONOSCOPY  08/10/2002   NUR: Normal colonoscopy except for some hemorrhoids and some erythema at the dentate line  . COLONOSCOPY  12/10/2006   Edwards:Diffuse colitis in the transverse and descending colon/This is not entirely typical of ischemic  colitis or her HIV positive/ disease.  We need to be concerned about other causes  . COLONOSCOPY  01/19/2011   RMR: Internal and external hemorrhoids, likely source of hematochezia anal papilla, otherwise normal rectal mucosa/ Left-sided diverticula.  Cecal polyp with status post cold snare polypectomy.  Remainder of colonic mucosa appeared normal  . ESOPHAGOGASTRODUODENOSCOPY  08/10/2002     NUR: Mild changes of reflux esophagitis limited to gastroesophageal junction  No evidence of ring, stricture, or esophageal candidiasis dysphagia/ Gastritis, possibly H. pylori induced  . ESOPHAGOGASTRODUODENOSCOPY  12/25/2002   NUR: Erosive antral gastritis.  The degree of gastritis is more significant than her last exam/ Normal examination of the esophagus/ Esophageal dilatation performed by passing 44 and 61 [place]  . ESOPHAGOGASTRODUODENOSCOPY  11/13/2010   RMR: Circumferential distal esophageal erosions with soft peptic stricture, consistent with erosive reflux esophagitis or stricture  formation, status post dilation as described above/  Small hiatal hernia/ Tiny antral erosions, otherwise normal stomach D1 and D2  . HEMORRHOID SURGERY  09/02/2012   Procedure: HEMORRHOIDECTOMY;  Surgeon: Jamesetta So, MD;  Location: [facility];  Service: General;  Laterality: N/A;  . TUBAL LIGATION      Family History  Problem Relation Age of Onset  . Diabetes Brother       Social History   Social History  . Marital  status: Widowed    Spouse name: N/A  . Number of children: N/A  . Years of education: N/A   Occupational History  . retired Retired   Social History Main Topics  . Smoking status: Never Smoker  . Smokeless tobacco: Never Used  . Alcohol use No  . Drug use: No  . Sexual activity: Not Currently     Comment: declined condoms   Other Topics Concern  . Not on file   Social History Narrative  . No narrative on file    Allergies  Allergen Reactions  . Bee Venom Anaphylaxis  .  Tenofovir Disoproxil Fumarate [Tenofovir Disoproxil Fumarate] Other (See Comments)    Renal failure, (08/27/2006)  . Codeine Itching and Nausea And Vomiting  . Haloperidol Lactate Other (See Comments)    Reaction is muscle tension, causes severe spasms in face and neck. Forces eyes to roll in the back of the head  . Haloperidol Lactate Nausea And Vomiting  . Morphine Swelling  . Morphine Sulfate Itching  . Navane [Thiothixene] Other (See Comments) and Nausea And Vomiting    Same muscle spasm reaction as haldol  . Prochlorperazine Nausea And Vomiting  . Prochlorperazine Edisylate Other (See Comments)    Same reaction as haldol   . Promethazine Hcl Other (See Comments)    Same as haldol   . Promethazine Hcl Swelling  . Propoxyphene Nausea And Vomiting  . Propoxyphene N-Acetaminophen Itching and Nausea And Vomiting     Current Outpatient Prescriptions:  .  albuterol (PROVENTIL HFA;VENTOLIN HFA) 108 (90 BASE) MCG/ACT inhaler, Inhale 2 puffs into the lungs every 6 (six) hours as needed. For shortness of breath, Disp: , Rfl:  .  albuterol (PROVENTIL) (2.5 MG/3ML) 0.083% nebulizer solution, Take 2.5 mg by nebulization every 6 (six) hours as needed for wheezing or shortness of breath., Disp: , Rfl:  .  amitriptyline (ELAVIL) 50 MG tablet, Take 50 mg by mouth at bedtime. , Disp: , Rfl:  .  Cholecalciferol (VITAMIN D3) 10000 units TABS, Take 1 tablet by mouth daily., Disp: , Rfl:  .  co-enzyme Q-10 30 MG capsule, Take 30 mg by mouth daily. , Disp: , Rfl:  .  diphenhydrAMINE (BENADRYL) 25 mg capsule, Take 25 mg by mouth daily as needed for itching (itching from hydrocodone)., Disp: , Rfl:  .  furosemide (LASIX) 40 MG tablet, Take 40 mg by mouth., Disp: , Rfl:  .  HYDROcodone-acetaminophen (NORCO/VICODIN) 5-325 MG per tablet, Take 1-2 tablets by mouth every 4 (four) hours as needed for moderate pain., Disp: 30 tablet, Rfl: 0 .  methocarbamol (ROBAXIN) 500 MG tablet, Take 1 tablet (500 mg total) by  mouth 3 (three) times daily., Disp: 21 tablet, Rfl: 0 .  naproxen (NAPROSYN) 375 MG tablet, Take 1 tablet (375 mg total) by mouth 2 (two) times daily with a meal., Disp: 10 tablet, Rfl: 0 .  nebivolol (BYSTOLIC) 5 MG tablet, Take 10 mg by mouth daily. , Disp: , Rfl:  .  ondansetron (ZOFRAN) 8 MG tablet, Take 8 mg by mouth daily., Disp: , Rfl:  .  polyvinyl alcohol (LIQUIFILM TEARS) 1.4 % ophthalmic solution, Place 1 drop into both eyes as needed for dry eyes., Disp: , Rfl:  .  thiamine (VITAMIN B-1) 100 MG tablet, Take 100 mg by mouth daily., Disp: , Rfl:  .  TRIUMEQ 600-50-300 MG tablet, TAKE (1) TABLET BY MOUTH ONCE DAILY., Disp: 30 tablet, Rfl: 11 .  VITAMIN B COMPLEX-C PO, Take 1 capsule by mouth  daily., Disp: , Rfl:  .  cephALEXin (KEFLEX) 500 MG capsule, Take 1 capsule (500 mg total) by mouth 2 (two) times daily., Disp: 20 capsule, Rfl: 1 .  LORazepam (ATIVAN) 2 MG tablet, Take 2 mg by mouth 2 (two) times daily as needed for anxiety. , Disp: , Rfl:   SReview of Systems  Constitutional: Positive for unexpected weight change. Negative for appetite change and diaphoresis.  HENT: Negative for congestion, sneezing and trouble swallowing.   Respiratory: Negative for chest tightness and stridor.   Cardiovascular: Negative for palpitations and leg swelling.  Gastrointestinal: Positive for abdominal pain and nausea. Negative for abdominal distention, anal bleeding, blood in stool, constipation and diarrhea.  Genitourinary: Positive for dysuria and urgency. Negative for difficulty urinating, flank pain and hematuria.  Musculoskeletal: Negative for gait problem and joint swelling.  Skin: Negative for color change, pallor and wound.  Neurological: Negative for tremors and light-headedness.  Hematological: Negative for adenopathy. Does not bruise/bleed easily.  Psychiatric/Behavioral: Negative for agitation, behavioral problems, confusion, decreased concentration, dysphoric mood, self-injury and sleep  disturbance. The patient is nervous/anxious.        Objective:   Physical Exam  Constitutional: She is oriented to person, place, and time. She appears well-developed and well-nourished. No distress.  HENT:  Head: Normocephalic and atraumatic.  Mouth/Throat: No oropharyngeal exudate.  Eyes: Conjunctivae and EOM are normal. No scleral icterus.  Neck: Normal range of motion. Neck supple.  Cardiovascular: Normal rate and regular rhythm.   Pulmonary/Chest: Effort normal. No respiratory distress. She has no wheezes.  Abdominal: She exhibits no distension. There is tenderness in the suprapubic area. There is no CVA tenderness.  Musculoskeletal: She exhibits no edema or tenderness.  Neurological: She is alert and oriented to person, place, and time. She exhibits normal muscle tone. Coordination normal.  Skin: Skin is warm and dry. No rash noted. She is not diaphoretic. No erythema. No pallor.  Psychiatric: She has a normal mood and affect. Her behavior is normal. Judgment and thought content normal.          Assessment & Plan:   Dysuria with suprapubic pain: seems likely to be cystitis. Will check UA and cultures and start keflex. DC her macrobid   IV: Continue TRIUMEQ and recheck labs  in 6 months time   HIV neuropathy: continue gabapentin  Depression:  continue amitriptyline at bedtime for now  CKD: relatively stable  Elevated LFT's:  Rechecking labs. Her hep panel was negative in past. There is mention of chronic hep B in problem list but sag - and not on TNF  Weight loss with nausea: rechecking LFTs defer further to PCP. Would consider CT abdomen with oral contrast  I spent greater than 25 minutes with the patient including greater than 50% of time in face to face counsel of the patient re her HIV, her ARV regimen her HIV neuropathy, CKD, elevated LFTS, likely UTI, weight loss  and in coordination of her care.

## 2017-03-04 ENCOUNTER — Telehealth: Payer: Self-pay | Admitting: *Deleted

## 2017-03-04 LAB — URINALYSIS, MICROSCOPIC ONLY
CRYSTALS: NONE SEEN [HPF]
WBC, UA: >60 WBC/HPF — AB (ref <=5)
YEAST: NONE SEEN [HPF]

## 2017-03-04 LAB — URINALYSIS, ROUTINE W REFLEX MICROSCOPIC
Bilirubin Urine: NEGATIVE
Glucose, UA: NEGATIVE
Hgb urine dipstick: NEGATIVE
KETONES UR: NEGATIVE
NITRITE: NEGATIVE
PH: 5.5 (ref 5.0–8.0)
SPECIFIC GRAVITY, URINE: 1.019 (ref 1.001–1.035)

## 2017-03-04 LAB — URINE CULTURE

## 2017-03-04 NOTE — Telephone Encounter (Signed)
-----   Message from Truman Hayward, MD sent at 03/04/2017  1:09 PM EDT ----- pts urine culture just grew nomral flora 10k. She may not have collected clean catch but does NOT appear c/w with UTI. She should stop her keflex

## 2017-03-04 NOTE — Telephone Encounter (Signed)
RN called patient, left generic message asking her to please call her doctor's office regarding tests from yesterday. Landis Gandy, RN

## 2017-03-05 ENCOUNTER — Telehealth: Payer: Self-pay | Admitting: *Deleted

## 2017-03-05 NOTE — Telephone Encounter (Signed)
Spoke to patient and she was informed to stop the keflex. She said she is still having burning and I advised her to follow up with her PCP if she continues to have symptoms. She agreed to do this. Myrtis Hopping

## 2017-03-05 NOTE — Telephone Encounter (Signed)
Perfect

## 2017-03-05 NOTE — Telephone Encounter (Signed)
-----   Message from Truman Hayward, MD sent at 03/04/2017  1:09 PM EDT ----- pts urine culture just grew nomral flora 10k. She may not have collected clean catch but does NOT appear c/w with UTI. She should stop her keflex

## 2017-03-07 NOTE — Telephone Encounter (Signed)
It could be a candidal irritation of the skin. I would be willing to give her fluconazole 100mg  daily x 10 days

## 2017-03-08 NOTE — Telephone Encounter (Signed)
Thanks

## 2017-03-08 NOTE — Telephone Encounter (Signed)
Attempted to call patient to find out if she contacted her PCP and if she was still having symptoms. Please relay Dr. Lucianne Lei Dam's message if patient call triage. No answer and no voicemail. Myrtis Hopping CMA

## 2017-03-29 ENCOUNTER — Observation Stay (HOSPITAL_COMMUNITY)
Admission: EM | Admit: 2017-03-29 | Discharge: 2017-03-30 | Disposition: A | Discharge status: Home / Self Care | Payer: Medicare Other | Attending: Internal Medicine | Admitting: Internal Medicine

## 2017-03-29 ENCOUNTER — Encounter (HOSPITAL_COMMUNITY): Payer: Self-pay | Admitting: Emergency Medicine

## 2017-03-29 ENCOUNTER — Emergency Department (HOSPITAL_COMMUNITY): Payer: Medicare Other

## 2017-03-29 DIAGNOSIS — I10 Essential (primary) hypertension: Secondary | ICD-10-CM | POA: Diagnosis present

## 2017-03-29 DIAGNOSIS — N183 Chronic kidney disease, stage 3 unspecified: Secondary | ICD-10-CM | POA: Diagnosis present

## 2017-03-29 DIAGNOSIS — Z79899 Other long term (current) drug therapy: Secondary | ICD-10-CM | POA: Diagnosis not present

## 2017-03-29 DIAGNOSIS — R05 Cough: Secondary | ICD-10-CM | POA: Diagnosis present

## 2017-03-29 DIAGNOSIS — J209 Acute bronchitis, unspecified: Secondary | ICD-10-CM | POA: Diagnosis present

## 2017-03-29 DIAGNOSIS — B20 Human immunodeficiency virus [HIV] disease: Secondary | ICD-10-CM | POA: Diagnosis present

## 2017-03-29 DIAGNOSIS — J441 Chronic obstructive pulmonary disease with (acute) exacerbation: Principal | ICD-10-CM | POA: Insufficient documentation

## 2017-03-29 DIAGNOSIS — I129 Hypertensive chronic kidney disease with stage 1 through stage 4 chronic kidney disease, or unspecified chronic kidney disease: Secondary | ICD-10-CM | POA: Diagnosis not present

## 2017-03-29 DIAGNOSIS — N1832 Chronic kidney disease, stage 3b: Secondary | ICD-10-CM | POA: Diagnosis present

## 2017-03-29 DIAGNOSIS — R0602 Shortness of breath: Secondary | ICD-10-CM | POA: Diagnosis not present

## 2017-03-29 LAB — BRAIN NATRIURETIC PEPTIDE: B Natriuretic Peptide: 184 pg/mL — ABNORMAL HIGH (ref 0.0–100.0)

## 2017-03-29 LAB — COMPREHENSIVE METABOLIC PANEL
ALK PHOS: 67 U/L (ref 38–126)
ALT: 67 U/L — AB (ref 14–54)
AST: 55 U/L — ABNORMAL HIGH (ref 15–41)
Albumin: 4.3 g/dL (ref 3.5–5.0)
Anion gap: 10 (ref 5–15)
BUN: 13 mg/dL (ref 6–20)
CALCIUM: 9.9 mg/dL (ref 8.9–10.3)
CO2: 27 mmol/L (ref 22–32)
CREATININE: 1.42 mg/dL — AB (ref 0.44–1.00)
Chloride: 103 mmol/L (ref 101–111)
GFR calc non Af Amer: 35 mL/min — ABNORMAL LOW (ref >60)
GFR, EST AFRICAN AMERICAN: 40 mL/min — AB (ref >60)
Glucose, Bld: 117 mg/dL — ABNORMAL HIGH (ref 65–99)
Potassium: 3.8 mmol/L (ref 3.5–5.1)
SODIUM: 140 mmol/L (ref 135–145)
Total Bilirubin: 1 mg/dL (ref 0.3–1.2)
Total Protein: 7.4 g/dL (ref 6.5–8.1)

## 2017-03-29 LAB — URINALYSIS, ROUTINE W REFLEX MICROSCOPIC
BACTERIA UA: NONE SEEN
Bilirubin Urine: NEGATIVE
Glucose, UA: NEGATIVE mg/dL
Hgb urine dipstick: NEGATIVE
KETONES UR: NEGATIVE mg/dL
LEUKOCYTES UA: NEGATIVE
Nitrite: NEGATIVE
PH: 7 (ref 5.0–8.0)
PROTEIN: 100 mg/dL — AB
RBC / HPF: NONE SEEN RBC/hpf (ref 0–5)
Specific Gravity, Urine: 1.012 (ref 1.005–1.030)

## 2017-03-29 LAB — CBC WITH DIFFERENTIAL/PLATELET
Basophils Absolute: 0 10*3/uL (ref 0.0–0.1)
Basophils Relative: 1 %
Eosinophils Absolute: 0.3 10*3/uL (ref 0.0–0.7)
Eosinophils Relative: 4 %
HCT: 43.5 % (ref 36.0–46.0)
HEMOGLOBIN: 15.1 g/dL — AB (ref 12.0–15.0)
LYMPHS ABS: 2 10*3/uL (ref 0.7–4.0)
LYMPHS PCT: 28 %
MCH: 35.2 pg — AB (ref 26.0–34.0)
MCHC: 34.7 g/dL (ref 30.0–36.0)
MCV: 101.4 fL — ABNORMAL HIGH (ref 78.0–100.0)
Monocytes Absolute: 0.9 10*3/uL (ref 0.1–1.0)
Monocytes Relative: 13 %
Neutro Abs: 4 10*3/uL (ref 1.7–7.7)
Neutrophils Relative %: 54 %
Platelets: 160 10*3/uL (ref 150–400)
RBC: 4.29 MIL/uL (ref 3.87–5.11)
RDW: 12.9 % (ref 11.5–15.5)
WBC: 7.2 10*3/uL (ref 4.0–10.5)

## 2017-03-29 LAB — I-STAT TROPONIN, ED: TROPONIN I, POC: 0.01 ng/mL (ref 0.00–0.08)

## 2017-03-29 MED ORDER — ALBUTEROL (5 MG/ML) CONTINUOUS INHALATION SOLN
10.0000 mg/h | INHALATION_SOLUTION | RESPIRATORY_TRACT | Status: DC
  Administered 2017-03-29: 10 mg/h via RESPIRATORY_TRACT
  Filled 2017-03-29: qty 20

## 2017-03-29 MED ORDER — LEVOFLOXACIN IN D5W 500 MG/100ML IV SOLN
INTRAVENOUS | Status: AC
  Filled 2017-03-29: qty 100

## 2017-03-29 MED ORDER — METHYLPREDNISOLONE SODIUM SUCC 125 MG IJ SOLR
125.0000 mg | Freq: Once | INTRAMUSCULAR | Status: AC
  Administered 2017-03-29: 125 mg via INTRAVENOUS
  Filled 2017-03-29: qty 2

## 2017-03-29 MED ORDER — ALBUTEROL SULFATE (2.5 MG/3ML) 0.083% IN NEBU
5.0000 mg | INHALATION_SOLUTION | Freq: Once | RESPIRATORY_TRACT | Status: AC
  Administered 2017-03-29: 5 mg via RESPIRATORY_TRACT
  Filled 2017-03-29: qty 6

## 2017-03-29 MED ORDER — LEVOFLOXACIN IN D5W 500 MG/100ML IV SOLN
500.0000 mg | Freq: Once | INTRAVENOUS | Status: AC
  Administered 2017-03-29: 500 mg via INTRAVENOUS
  Filled 2017-03-29: qty 100

## 2017-03-29 NOTE — ED Provider Notes (Signed)
[facility] DEPT Provider Note   CSN: [id number] Arrival date & time: 03/29/17  1547     History   Chief Complaint Chief Complaint  Patient presents with  . Cough    HPI [name] is a 76 y.o. female.  HPI Patient lives with one week of worsening cough. Worse with lying flat. Productive of pink sputum. This intermittent diaphoresis. Dyspnea with exertion. Patient with occasional central chest pain. Currently is pain free. Has had some mild bilateral lower extremity swelling. Has been taking her Lasix recently. Denies any fever chills. Last viral load 2 months ago was undetectable. Past Medical History:  Diagnosis Date  . Anxiety   . Arthritis   . Bronchitis   . CKD (chronic kidney disease) stage 3, GFR 30-59 ml/min 05/04/2016  . COPD (chronic obstructive pulmonary disease) ([place])   . Depression   . Elevated LFTs 05/04/2016  . History of TIAs 1981   left side weakness  . HIV (human immunodeficiency virus infection) ([place])   . Hypertension   . Kidney disease related to HIV infection [facility])    pt states she has to have her creatinine level checked often   . Nausea 03/03/2017  . Peripheral neuropathy    bilateral feet  . UTI (urinary tract infection) 03/03/2017  . Weight loss 03/03/2017    Patient Active Problem List   Diagnosis Date Noted  . UTI (urinary tract infection) 03/03/2017  . Nausea 03/03/2017  . Weight loss 03/03/2017  . Fever 07/22/2016  . CAP (community acquired pneumonia) 07/22/2016  . CKD (chronic kidney disease) stage 3, GFR 30-59 ml/min 05/04/2016  . Elevated LFTs 05/04/2016  . 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 Breath shortness 06/19/2014  . Syncope 05/31/2014  . Rib fracture 05/31/2014  . Syncope and collapse 05/31/2014  . Weight gain 03/21/2014  . Cervical spondylosis 08/15/2013  . Neck pain on left side 05/24/2013  . Unspecified vitamin D deficiency 12/14/2012  . Vitamin D toxicity 12/14/2012  . Hemorrhoid 09/01/2012  . Myalgia 02/25/2012  .  HTN (hypertension) 11/25/2011  . Cramps, muscle, general 08/18/2011  . RECTAL BLEEDING 11/03/2010  . DYSPHAGIA 11/03/2010  . FATIGUE 08/29/2010  . MUSCLE PAIN 06/04/2010  . Acute cystitis 05/12/2010  . RENAL INSUFFICIENCY 12/18/2008  . DEPRESSION 09/04/2008  . HYPERLIPIDEMIA 05/22/2008  . IRRITABLE BOWEL SYNDROME 05/22/2008  . HERNIATED CERVICAL DISC 10/05/2007  . HERNIATED LUMBAR DISC 10/05/2007  . History of urinary tract infection 09/20/2007  . HIV-1 associated autonomic neuropathy ([place]) 03/16/2007  . ELBOW PAIN 03/16/2007  . ISCHEMIC COLITIS 12/27/2006  . RENAL FAILURE NOS 12/27/2006  . Human immunodeficiency virus (HIV) disease ([place]) 08/27/2006    Past Surgical History:  Procedure Laterality Date  . ABDOMINAL HYSTERECTOMY  1985  . BACK SURGERY    . CATARACT EXTRACTION, BILATERAL    . CHOLECYSTECTOMY    . COLONOSCOPY  08/10/2002   NUR: Normal colonoscopy except for some hemorrhoids and some erythema at the dentate line  . COLONOSCOPY  12/10/2006   Edwards:Diffuse colitis in the transverse and descending colon/This is not entirely typical of ischemic colitis or her HIV positive/ disease.  We need to be concerned about other causes  . COLONOSCOPY  01/19/2011   RMR: Internal and external hemorrhoids, likely source of hematochezia anal papilla, otherwise normal rectal mucosa/ Left-sided diverticula.  Cecal polyp with status post cold snare polypectomy.  Remainder of colonic mucosa appeared normal  . ESOPHAGOGASTRODUODENOSCOPY  08/10/2002     NUR: Mild changes of reflux esophagitis limited  to gastroesophageal junction  No evidence of ring, stricture, or esophageal candidiasis dysphagia/ Gastritis, possibly H. pylori induced  . ESOPHAGOGASTRODUODENOSCOPY  12/25/2002   NUR: Erosive antral gastritis.  The degree of gastritis is more significant than her last exam/ Normal examination of the esophagus/ Esophageal dilatation performed by passing 102 and 93 [place]  .  ESOPHAGOGASTRODUODENOSCOPY  11/13/2010   RMR: Circumferential distal esophageal erosions with soft peptic stricture, consistent with erosive reflux esophagitis or stricture  formation, status post dilation as described above/  Small hiatal hernia/ Tiny antral erosions, otherwise normal stomach D1 and D2  . HEMORRHOID SURGERY  09/02/2012   Procedure: HEMORRHOIDECTOMY;  Surgeon: Jamesetta So, MD;  Location: [facility];  Service: General;  Laterality: N/A;  . TUBAL LIGATION      OB History    Gravida Para Term Preterm AB Living   4 3 2 1 1 3    SAB TAB Ectopic Multiple Live Births   1               Home Medications    Prior to Admission medications   Medication Sig Start Date End Date Taking? Authorizing Provider  albuterol (PROVENTIL HFA;VENTOLIN HFA) 108 (90 BASE) MCG/ACT inhaler Inhale 2 puffs into the lungs every 6 (six) hours as needed. For shortness of breath   Yes [provider]  albuterol (PROVENTIL) (2.5 MG/3ML) 0.083% nebulizer solution Take 2.5 mg by nebulization every 6 (six) hours as needed for wheezing or shortness of breath.   Yes [provider]  amitriptyline (ELAVIL) 50 MG tablet Take 50 mg by mouth at bedtime.    Yes [provider]  cephALEXin (KEFLEX) 500 MG capsule Take 1 capsule (500 mg total) by mouth 2 (two) times daily. 03/03/17  Yes [name], Lavell Islam, MD  Cholecalciferol (VITAMIN D3) 10000 units TABS Take 1 tablet by mouth daily.   Yes [provider]  co-enzyme Q-10 30 MG capsule Take 30 mg by mouth daily.    Yes [provider]  diphenhydrAMINE (BENADRYL) 25 mg capsule Take 25 mg by mouth daily as needed for itching (itching from hydrocodone).   Yes [provider]  furosemide (LASIX) 40 MG tablet Take 40 mg by mouth daily as needed for fluid.    Yes [provider]  levothyroxine (SYNTHROID, LEVOTHROID) 25 MCG tablet Take 25 mcg by mouth daily. 02/03/17  Yes [provider]  nebivolol  (BYSTOLIC) 10 MG tablet Take 10 mg by mouth daily.    Yes [provider]  ondansetron (ZOFRAN) 8 MG tablet Take 8 mg by mouth daily as needed for nausea or vomiting.    Yes [provider]  Oxycodone HCl 10 MG TABS Take 10 mg by mouth 4 (four) times daily as needed. For pain 03/25/17  Yes [provider]  polyvinyl alcohol (LIQUIFILM TEARS) 1.4 % ophthalmic solution Place 1 drop into both eyes as needed for dry eyes.   Yes [provider]  thiamine (VITAMIN B-1) 100 MG tablet Take 100 mg by mouth daily.   Yes [provider]  TRIUMEQ 600-50-300 MG tablet TAKE (1) TABLET BY MOUTH ONCE DAILY. 01/27/17  Yes [name], Lavell Islam, MD  VITAMIN B COMPLEX-C PO Take 1 capsule by mouth daily.   Yes [provider]    Family History Family History  Problem Relation Age of Onset  . Diabetes Brother     Social History Social History  Substance Use Topics  . Smoking status: Never  Smoker  . Smokeless tobacco: Never Used  . Alcohol use No     Allergies   Bee venom; Tenofovir disoproxil fumarate [tenofovir disoproxil fumarate]; Codeine; Haloperidol lactate; Morphine; Navane [thiothixene]; Prochlorperazine; Promethazine hcl; and Propoxyphene n-acetaminophen   Review of Systems Review of Systems  Constitutional: Positive for diaphoresis. Negative for chills and fever.  HENT: Negative for congestion, sinus pressure, sneezing, sore throat and trouble swallowing.   Eyes: Negative for visual disturbance.  Respiratory: Positive for cough, chest tightness, shortness of breath and wheezing.   Cardiovascular: Positive for chest pain and leg swelling. Negative for palpitations.  Gastrointestinal: Negative for abdominal pain, blood in stool, constipation, diarrhea, nausea and vomiting.  Genitourinary: Negative for dysuria, flank pain, frequency and hematuria.  Musculoskeletal: Negative for back pain, joint swelling, myalgias, neck pain and neck stiffness.    Skin: Negative for rash and wound.  Neurological: Negative for dizziness, weakness, light-headedness, numbness and headaches.  All other systems reviewed and are negative.    Physical Exam Updated Vital Signs BP (!) 186/72 (BP Location: Left Arm)   Pulse 77   Temp 98.3 F (36.8 C) (Oral)   Resp 18   Ht 5\' 4"  (1.626 m)   Wt 99.3 kg (219 lb)   SpO2 95%   BMI 37.59 kg/m   Physical Exam  Constitutional: She is oriented to person, place, and time. She appears well-developed and well-nourished. No distress.  HENT:  Head: Normocephalic and atraumatic.  Mouth/Throat: Oropharynx is clear and moist. No oropharyngeal exudate.  Eyes: EOM are normal. Pupils are equal, round, and reactive to light.  Neck: Normal range of motion. Neck supple. No JVD present.  No meningismus  Cardiovascular: Normal rate and regular rhythm.  Exam reveals no gallop and no friction rub.   No murmur heard. Pulmonary/Chest: Effort normal. She has wheezes (Expiratory wheezing throughout).  Abdominal: Soft. Bowel sounds are normal. There is no tenderness. There is no rebound and no guarding.  Musculoskeletal: Normal range of motion. She exhibits edema. She exhibits no tenderness.  1+ bilateral lower extremity pretibial edema. Distal pulses intact. No midline thoracic or lumbar tenderness. No CVA tenderness.  Lymphadenopathy:    She has no cervical adenopathy.  Neurological: She is alert and oriented to person, place, and time.  Moves all extremities without focal deficit. Sensation fully intact.  Skin: Skin is warm and dry. Capillary refill takes less than 2 seconds. No rash noted. No erythema.  Psychiatric: She has a normal mood and affect. Her behavior is normal.  Nursing note and vitals reviewed.    ED Treatments / Results  Labs (all labs ordered are listed, but only abnormal results are displayed) Labs Reviewed  CBC WITH DIFFERENTIAL/PLATELET - Abnormal; Notable for the following:       Result Value    Hemoglobin 15.1 (*)    MCV 101.4 (*)    MCH 35.2 (*)    All other components within normal limits  COMPREHENSIVE METABOLIC PANEL - Abnormal; Notable for the following:    Glucose, Bld 117 (*)    Creatinine, Ser 1.42 (*)    AST 55 (*)    ALT 67 (*)    GFR calc non Af Amer 35 (*)    GFR calc Af Amer 40 (*)    All other components within normal limits  BRAIN NATRIURETIC PEPTIDE - Abnormal; Notable for the following:    B Natriuretic Peptide 184.0 (*)    All other components within normal limits  URINALYSIS, ROUTINE W REFLEX MICROSCOPIC -  Abnormal; Notable for the following:    Protein, ur 100 (*)    Squamous Epithelial / LPF 0-5 (*)    All other components within normal limits  I-STAT TROPOININ, ED    EKG  EKG Interpretation  Date/Time:  Monday March 29 2017 16:59:32 EDT Ventricular Rate:  68 PR Interval:    QRS Duration: 99 QT Interval:  445 QTC Calculation: 474 R Axis:   42 Text Interpretation:  Sinus rhythm Confirmed by Lita Mains  MD, Ferdinand Revoir ([id number]) on 03/29/2017 11:15:24 PM       Radiology Dg Chest 2 View  Result Date: 03/29/2017 CLINICAL DATA:  Shortness of breath.  Productive cough. EXAM: CHEST  2 VIEW COMPARISON:  07/22/2016 FINDINGS: The heart size and mediastinal contours are within normal limits. Both lungs are clear. The visualized skeletal structures are unremarkable. IMPRESSION: No active cardiopulmonary disease. Electronically Signed   By: Lorriane Shire M.D.   On: 03/29/2017 17:39    Procedures Procedures (including critical care time)  Medications Ordered in ED Medications  albuterol (PROVENTIL,VENTOLIN) solution continuous neb (10 mg/hr Nebulization New Bag/Given 03/29/17 2041)  levofloxacin (LEVAQUIN) 500 MG/100ML IVPB (not administered)  albuterol (PROVENTIL) (2.5 MG/3ML) 0.083% nebulizer solution 5 mg (5 mg Nebulization Given 03/29/17 1747)  methylPREDNISolone sodium succinate (SOLU-MEDROL) 125 mg/2 mL injection 125 mg (125 mg Intravenous Given 03/29/17 2124)    levofloxacin (LEVAQUIN) IVPB 500 mg (0 mg Intravenous Stopped 03/29/17 2235)     Initial Impression / Assessment and Plan / ED Course  I have reviewed the triage vital signs and the nursing notes.  Pertinent labs & imaging results that were available during my care of the patient were reviewed by me and considered in my medical decision making (see chart for details).    Patient with persistent wheezing and dyspnea with mild exertion despite multiple nebulized treatments. Patient is coughing up thick yellow sputum in the emergency department. No definite evidence of pneumonia on x-ray. Started on antibiotic and discuss with hospitalist who will admit for COPD exacerbation.   Final Clinical Impressions(s) / ED Diagnoses   Final diagnoses:  COPD with exacerbation [facility])    New Prescriptions New Prescriptions   No medications on file     Julianne Rice, MD 03/29/17 2315

## 2017-03-29 NOTE — ED Triage Notes (Signed)
Barky sounding cough x 1week.  Reports couging up small amount of pink tinged mucous.  Sob with activity.  Reported to ems night sweats, pt states they come and go and has been having them for years.  Ems placed pt on tb precautions.

## 2017-03-30 ENCOUNTER — Encounter (HOSPITAL_COMMUNITY): Payer: Self-pay

## 2017-03-30 DIAGNOSIS — I10 Essential (primary) hypertension: Secondary | ICD-10-CM

## 2017-03-30 DIAGNOSIS — J209 Acute bronchitis, unspecified: Secondary | ICD-10-CM | POA: Diagnosis not present

## 2017-03-30 DIAGNOSIS — J441 Chronic obstructive pulmonary disease with (acute) exacerbation: Secondary | ICD-10-CM | POA: Diagnosis not present

## 2017-03-30 DIAGNOSIS — B20 Human immunodeficiency virus [HIV] disease: Secondary | ICD-10-CM

## 2017-03-30 DIAGNOSIS — N183 Chronic kidney disease, stage 3 (moderate): Secondary | ICD-10-CM

## 2017-03-30 LAB — COMPREHENSIVE METABOLIC PANEL
ALBUMIN: 4.3 g/dL (ref 3.5–5.0)
ALT: 62 U/L — AB (ref 14–54)
AST: 49 U/L — AB (ref 15–41)
Alkaline Phosphatase: 71 U/L (ref 38–126)
Anion gap: 10 (ref 5–15)
BILIRUBIN TOTAL: 0.8 mg/dL (ref 0.3–1.2)
BUN: 17 mg/dL (ref 6–20)
CO2: 24 mmol/L (ref 22–32)
CREATININE: 1.45 mg/dL — AB (ref 0.44–1.00)
Calcium: 9.7 mg/dL (ref 8.9–10.3)
Chloride: 105 mmol/L (ref 101–111)
GFR calc Af Amer: 39 mL/min — ABNORMAL LOW (ref >60)
GFR, EST NON AFRICAN AMERICAN: 34 mL/min — AB (ref >60)
GLUCOSE: 235 mg/dL — AB (ref 65–99)
Potassium: 4 mmol/L (ref 3.5–5.1)
Sodium: 139 mmol/L (ref 135–145)
TOTAL PROTEIN: 7.4 g/dL (ref 6.5–8.1)

## 2017-03-30 LAB — CBC
HEMATOCRIT: 44.1 % (ref 36.0–46.0)
HEMOGLOBIN: 15.3 g/dL — AB (ref 12.0–15.0)
MCH: 35.2 pg — ABNORMAL HIGH (ref 26.0–34.0)
MCHC: 34.7 g/dL (ref 30.0–36.0)
MCV: 101.4 fL — AB (ref 78.0–100.0)
Platelets: 161 10*3/uL (ref 150–400)
RBC: 4.35 MIL/uL (ref 3.87–5.11)
RDW: 12.8 % (ref 11.5–15.5)
WBC: 4.9 10*3/uL (ref 4.0–10.5)

## 2017-03-30 MED ORDER — ABACAVIR-DOLUTEGRAVIR-LAMIVUD 600-50-300 MG PO TABS
1.0000 | ORAL_TABLET | Freq: Every day | ORAL | Status: DC
  Administered 2017-03-30: 1 via ORAL
  Filled 2017-03-30 (×2): qty 1

## 2017-03-30 MED ORDER — GUAIFENESIN ER 600 MG PO TB12: 600.0000 mg | ORAL_TABLET | Freq: Two times a day (BID) | ORAL | 0 refills | Status: DC

## 2017-03-30 MED ORDER — ENSURE ENLIVE PO LIQD
237.0000 mL | Freq: Two times a day (BID) | ORAL | Status: DC
  Administered 2017-03-30: 237 mL via ORAL

## 2017-03-30 MED ORDER — ONDANSETRON HCL 4 MG PO TABS: 8.0000 mg | ORAL_TABLET | Freq: Every day | ORAL | Status: DC | PRN

## 2017-03-30 MED ORDER — VITAMIN D 1000 UNITS PO TABS
1000.0000 [IU] | ORAL_TABLET | Freq: Every day | ORAL | Status: DC
  Administered 2017-03-30: 1000 [IU] via ORAL
  Filled 2017-03-30: qty 1

## 2017-03-30 MED ORDER — LEVOFLOXACIN IN D5W 750 MG/150ML IV SOLN: 750.0000 mg | INTRAVENOUS | Status: DC

## 2017-03-30 MED ORDER — GUAIFENESIN ER 600 MG PO TB12
600.0000 mg | ORAL_TABLET | Freq: Two times a day (BID) | ORAL | Status: DC
  Administered 2017-03-30: 600 mg via ORAL
  Filled 2017-03-30: qty 1

## 2017-03-30 MED ORDER — METHYLPREDNISOLONE SODIUM SUCC 125 MG IJ SOLR
60.0000 mg | Freq: Four times a day (QID) | INTRAMUSCULAR | Status: DC
  Administered 2017-03-30 (×3): 60 mg via INTRAVENOUS
  Filled 2017-03-30 (×3): qty 2

## 2017-03-30 MED ORDER — PREDNISONE 10 MG PO TABS: ORAL_TABLET | ORAL | 0 refills | Status: DC

## 2017-03-30 MED ORDER — HYDRALAZINE HCL 25 MG PO TABS: 25.0000 mg | ORAL_TABLET | Freq: Four times a day (QID) | ORAL | Status: DC | PRN

## 2017-03-30 MED ORDER — SODIUM CHLORIDE 0.9 % IV SOLN
INTRAVENOUS | Status: DC
  Administered 2017-03-30: 02:00:00 via INTRAVENOUS

## 2017-03-30 MED ORDER — LEVOFLOXACIN 500 MG PO TABS: 500.0000 mg | ORAL_TABLET | Freq: Every day | ORAL | 0 refills | Status: DC

## 2017-03-30 MED ORDER — NEBIVOLOL HCL 10 MG PO TABS
10.0000 mg | ORAL_TABLET | Freq: Every day | ORAL | Status: DC
  Administered 2017-03-30: 10 mg via ORAL
  Filled 2017-03-30: qty 1

## 2017-03-30 MED ORDER — ENOXAPARIN SODIUM 40 MG/0.4ML ~~LOC~~ SOLN
40.0000 mg | SUBCUTANEOUS | Status: DC
  Administered 2017-03-30: 40 mg via SUBCUTANEOUS
  Filled 2017-03-30: qty 0.4

## 2017-03-30 MED ORDER — IPRATROPIUM-ALBUTEROL 0.5-2.5 (3) MG/3ML IN SOLN
3.0000 mL | Freq: Four times a day (QID) | RESPIRATORY_TRACT | Status: DC
  Administered 2017-03-30 (×3): 3 mL via RESPIRATORY_TRACT
  Filled 2017-03-30 (×3): qty 3

## 2017-03-30 MED ORDER — AMITRIPTYLINE HCL 25 MG PO TABS: 50.0000 mg | ORAL_TABLET | Freq: Every day | ORAL | Status: DC

## 2017-03-30 MED ORDER — CHLORHEXIDINE GLUCONATE 0.12 % MT SOLN
15.0000 mL | Freq: Two times a day (BID) | OROMUCOSAL | Status: DC
  Administered 2017-03-30: 15 mL via OROMUCOSAL
  Filled 2017-03-30: qty 15

## 2017-03-30 MED ORDER — VITAMIN B-1 100 MG PO TABS
100.0000 mg | ORAL_TABLET | Freq: Every day | ORAL | Status: DC
  Administered 2017-03-30: 100 mg via ORAL
  Filled 2017-03-30: qty 1

## 2017-03-30 MED ORDER — ORAL CARE MOUTH RINSE
15.0000 mL | Freq: Two times a day (BID) | OROMUCOSAL | Status: DC
  Administered 2017-03-30: 15 mL via OROMUCOSAL

## 2017-03-30 MED ORDER — OXYCODONE HCL 5 MG PO TABS: 10.0000 mg | ORAL_TABLET | Freq: Four times a day (QID) | ORAL | Status: DC | PRN

## 2017-03-30 MED ORDER — LEVOTHYROXINE SODIUM 25 MCG PO TABS
25.0000 ug | ORAL_TABLET | Freq: Every day | ORAL | Status: DC
  Administered 2017-03-30: 25 ug via ORAL
  Filled 2017-03-30: qty 1

## 2017-03-30 NOTE — Progress Notes (Signed)
Pharmacy Antibiotic Note  [name] is a 76 y.o. female admitted on 03/29/2017 with pneumonia.  Pharmacy has been consulted for [facility] dosing.  Plan: Levaquin 750mg  IV q48hrs (renally adjusted) Monitor labs, progress, c/s  Height: 5\' 2"  (157.5 cm) Weight: 198 lb 4.8 oz (89.9 kg) IBW/kg (Calculated) : 50.1  Temp (24hrs), Avg:98.5 F (36.9 C), Min:98.3 F (36.8 C), Max:98.8 F (37.1 C)   Recent Labs Lab 03/29/17 1704 03/30/17 0513  WBC 7.2 4.9  CREATININE 1.42* 1.45*    Estimated Creatinine Clearance: 34.4 mL/min (A) (by C-G formula based on SCr of 1.45 mg/dL (H)).    Allergies  Allergen Reactions  . Bee Venom Anaphylaxis  . Tenofovir Disoproxil Fumarate [Tenofovir Disoproxil Fumarate] Other (See Comments)    Renal failure, (08/27/2006)  . Codeine Itching and Nausea And Vomiting  . Haloperidol Lactate Other (See Comments)    Reaction is muscle tension, causes severe spasms in face and neck. Forces eyes to roll in the back of the head  . Morphine Itching and Swelling  . Navane [Thiothixene] Other (See Comments) and Nausea And Vomiting    Same muscle spasm reaction as haldol  . Prochlorperazine Nausea And Vomiting  . Promethazine Hcl Other (See Comments)    Same as haldol   . Propoxyphene N-Acetaminophen Itching and Nausea And Vomiting   Antimicrobials this admission: Levaquin 6/5 >>   Dose adjustments this admission:  Microbiology results:  BCx: pending  UCx: pending   Sputum:    MRSA PCR:   Thank you for allowing pharmacy to be a part of this patient's care.  Hart Robinsons A 03/30/2017 8:31 AM

## 2017-03-30 NOTE — Discharge Summary (Signed)
Physician Discharge Summary  [name] Taddeo KCL:[id number] DOB: 24-Dec-1940 DOA: 03/29/2017  PCP: Octavio Graves, DO  Admit date: 03/29/2017 Discharge date: 03/30/2017  Admitted From: home Disposition:  home  Recommendations for Outpatient Follow-up:  1. Follow up with PCP in 1-2 weeks 2. Please obtain BMP/CBC in one week   Discharge Condition: stable CODE STATUS:full code Diet recommendation: Heart Healthy  Brief/Interim Summary: 76 y/o female with a history of HIV, chronic kidney disease stage III, hypertension presented with cough, wheezing and shortness of breath. Found to have an acute bronchitis. Admitted to the hospital for further management. Started on IV fluids, antibiotics and bronchodilators. Her respiratory status clinically improved. She is now breathing comfortably on room air. Lungs are clear bilaterally. She is able to ambulate without difficulty. She was placed on a prednisone taper, continue antibiotics and continue bronchodilators at home. She is otherwise stable for discharge. Remainder of her medical issues remained stable.  Discharge Diagnoses:  Principal Problem:   Acute bronchitis Active Problems:   Human immunodeficiency virus (HIV) disease (HCC)   HTN (hypertension)   CKD (chronic kidney disease) stage 3, GFR 30-59 ml/min    Discharge Instructions  Discharge Instructions    Diet - low sodium heart healthy    Complete by:  As directed    Increase activity slowly    Complete by:  As directed      Allergies as of 03/30/2017      Reactions   Bee Venom Anaphylaxis   Tenofovir Disoproxil Fumarate [tenofovir Disoproxil Fumarate] Other (See Comments)   Renal failure, (08/27/2006)   Codeine Itching, Nausea And Vomiting   Haloperidol Lactate Other (See Comments)   Reaction is muscle tension, causes severe spasms in face and neck. Forces eyes to roll in the back of the head   Morphine Itching, Swelling   Navane [thiothixene] Other (See Comments), Nausea And  Vomiting   Same muscle spasm reaction as haldol   Prochlorperazine Nausea And Vomiting   Promethazine Hcl Other (See Comments)   Same as haldol    Propoxyphene N-acetaminophen Itching, Nausea And Vomiting      Medication List    STOP taking these medications   cephALEXin 500 MG capsule Commonly known as:  KEFLEX     TAKE these medications   albuterol (2.5 MG/3ML) 0.083% nebulizer solution Commonly known as:  PROVENTIL Take 2.5 mg by nebulization every 6 (six) hours as needed for wheezing or shortness of breath.   albuterol 108 (90 Base) MCG/ACT inhaler Commonly known as:  PROVENTIL HFA;VENTOLIN HFA Inhale 2 puffs into the lungs every 6 (six) hours as needed. For shortness of breath   amitriptyline 50 MG tablet Commonly known as:  ELAVIL Take 50 mg by mouth at bedtime.   BENADRYL 25 mg capsule Generic drug:  diphenhydrAMINE Take 25 mg by mouth daily as needed for itching (itching from hydrocodone).   co-enzyme Q-10 30 MG capsule Take 30 mg by mouth daily.   furosemide 40 MG tablet Commonly known as:  LASIX Take 40 mg by mouth daily as needed for fluid.   guaiFENesin 600 MG 12 hr tablet Commonly known as:  MUCINEX Take 1 tablet (600 mg total) by mouth 2 (two) times daily.   levofloxacin 500 MG tablet Commonly known as:  LEVAQUIN Take 1 tablet (500 mg total) by mouth daily.   levothyroxine 25 MCG tablet Commonly known as:  SYNTHROID, LEVOTHROID Take 25 mcg by mouth daily.   nebivolol 10 MG tablet Commonly known as:  BYSTOLIC  Take 10 mg by mouth daily.   ondansetron 8 MG tablet Commonly known as:  ZOFRAN Take 8 mg by mouth daily as needed for nausea or vomiting.   Oxycodone HCl 10 MG Tabs Take 10 mg by mouth 4 (four) times daily as needed. For pain   polyvinyl alcohol 1.4 % ophthalmic solution Commonly known as:  LIQUIFILM TEARS Place 1 drop into both eyes as needed for dry eyes.   predniSONE 10 MG tablet Commonly known as:  DELTASONE Take 40mg  po daily  for 2 days then 30mg  daily for 2 days then 20mg  daily for 2 days then 10mg  daily for 2 days then stop   thiamine 100 MG tablet Commonly known as:  VITAMIN B-1 Take 100 mg by mouth daily.   TRIUMEQ 194-17-408 MG tablet Generic drug:  abacavir-dolutegravir-lamiVUDine TAKE (1) TABLET BY MOUTH ONCE DAILY.   VITAMIN B COMPLEX-C PO Take 1 capsule by mouth daily.   Vitamin D3 10000 units Tabs Take 1 tablet by mouth daily.       Allergies  Allergen Reactions  . Bee Venom Anaphylaxis  . Tenofovir Disoproxil Fumarate [Tenofovir Disoproxil Fumarate] Other (See Comments)    Renal failure, (08/27/2006)  . Codeine Itching and Nausea And Vomiting  . Haloperidol Lactate Other (See Comments)    Reaction is muscle tension, causes severe spasms in face and neck. Forces eyes to roll in the back of the head  . Morphine Itching and Swelling  . Navane [Thiothixene] Other (See Comments) and Nausea And Vomiting    Same muscle spasm reaction as haldol  . Prochlorperazine Nausea And Vomiting  . Promethazine Hcl Other (See Comments)    Same as haldol   . Propoxyphene N-Acetaminophen Itching and Nausea And Vomiting    Consultations:     Procedures/Studies: Dg Chest 2 View  Result Date: 03/29/2017 CLINICAL DATA:  Shortness of breath.  Productive cough. EXAM: CHEST  2 VIEW COMPARISON:  07/22/2016 FINDINGS: The heart size and mediastinal contours are within normal limits. Both lungs are clear. The visualized skeletal structures are unremarkable. IMPRESSION: No active cardiopulmonary disease. Electronically Signed   By: Lorriane Shire M.D.   On: 03/29/2017 17:39      Subjective: Breathing is improving. Still has mild cough.  Discharge Exam: Vitals:   03/30/17 0500 03/30/17 1300  BP: (!) 155/65 (!) 152/74  Pulse: 72 76  Resp: 18 18  Temp: 98.4 F (36.9 C) 98.5 F (36.9 C)   Vitals:   03/30/17 0500 03/30/17 0731 03/30/17 1300 03/30/17 1337  BP: (!) 155/65  (!) 152/74   Pulse: 72  76    Resp: 18  18   Temp: 98.4 F (36.9 C)  98.5 F (36.9 C)   TempSrc: Oral  Oral   SpO2: 97% 98% 100% 97%  Weight:      Height:        General: Pt is alert, awake, not in acute distress Cardiovascular: RRR, S1/S2 +, no rubs, no gallops Respiratory: CTA bilaterally, no wheezing, no rhonchi Abdominal: Soft, NT, ND, bowel sounds + Extremities: no edema, no cyanosis    The results of significant diagnostics from this hospitalization (including imaging, microbiology, ancillary and laboratory) are listed below for reference.     Microbiology: No results found for this or any previous visit (from the past 240 hour(s)).   Labs: BNP (last 3 results)  Recent Labs  03/29/17 1704  BNP 144.8*   Basic Metabolic Panel:  Recent Labs Lab 03/29/17 1704 03/30/17 0513  NA 140 139  K 3.8 4.0  CL 103 105  CO2 27 24  GLUCOSE 117* 235*  BUN 13 17  CREATININE 1.42* 1.45*  CALCIUM 9.9 9.7   Liver Function Tests:  Recent Labs Lab 03/29/17 1704 03/30/17 0513  AST 55* 49*  ALT 67* 62*  ALKPHOS 67 71  BILITOT 1.0 0.8  PROT 7.4 7.4  ALBUMIN 4.3 4.3   No results for input(s): LIPASE, AMYLASE in the last 168 hours. No results for input(s): AMMONIA in the last 168 hours. CBC:  Recent Labs Lab 03/29/17 1704 03/30/17 0513  WBC 7.2 4.9  NEUTROABS 4.0  --   HGB 15.1* 15.3*  HCT 43.5 44.1  MCV 101.4* 101.4*  PLT 160 161   Cardiac Enzymes: No results for input(s): CKTOTAL, CKMB, CKMBINDEX, TROPONINI in the last 168 hours. BNP: Invalid input(s): POCBNP CBG: No results for input(s): GLUCAP in the last 168 hours. D-Dimer No results for input(s): DDIMER in the last 72 hours. Hgb A1c No results for input(s): HGBA1C in the last 72 hours. Lipid Profile No results for input(s): CHOL, HDL, LDLCALC, TRIG, CHOLHDL, LDLDIRECT in the last 72 hours. Thyroid function studies No results for input(s): TSH, T4TOTAL, T3FREE, THYROIDAB in the last 72 hours.  Invalid input(s):  FREET3 Anemia work up No results for input(s): VITAMINB12, FOLATE, FERRITIN, TIBC, IRON, RETICCTPCT in the last 72 hours. Urinalysis    Component Value Date/Time   COLORURINE YELLOW 03/29/2017 1643   APPEARANCEUR CLEAR 03/29/2017 1643   LABSPEC 1.012 03/29/2017 1643   PHURINE 7.0 03/29/2017 1643   GLUCOSEU NEGATIVE 03/29/2017 1643   GLUCOSEU NEG mg/dL 09/20/2007 1114   HGBUR NEGATIVE 03/29/2017 1643   HGBUR moderate 06/04/2010 1425   BILIRUBINUR NEGATIVE 03/29/2017 1643   KETONESUR NEGATIVE 03/29/2017 1643   PROTEINUR 100 (A) 03/29/2017 1643   UROBILINOGEN 1.0 05/31/2014 0933   NITRITE NEGATIVE 03/29/2017 1643   LEUKOCYTESUR NEGATIVE 03/29/2017 1643   Sepsis Labs Invalid input(s): PROCALCITONIN,  WBC,  LACTICIDVEN Microbiology No results found for this or any previous visit (from the past 240 hour(s)).   Time coordinating discharge: Over 30 minutes  SIGNED:   Kathie Dike, MD  [facility] 03/30/2017, 2:41 PM Pager   If 7PM-7AM, please contact night-coverage [email or web address] Password [email or web address]

## 2017-03-30 NOTE — Care Management Obs Status (Signed)
[place] NOTIFICATION   Patient Details  Name: [name] MRN: [id number] Date of Birth: 05/23/1941   Medicare Observation Status Notification Given:  Yes    Babacar Haycraft, Chauncey Reading, RN 03/30/2017, 11:00 AM

## 2017-03-30 NOTE — Progress Notes (Signed)
Inpatient Diabetes Program Recommendations  AACE/ADA: New Consensus Statement on Inpatient Glycemic Control (2015)  Target Ranges:  Prepandial:   less than 140 mg/dL      Peak postprandial:   less than 180 mg/dL (1-2 hours)      Critically ill patients:  140 - 180 mg/dL   Results for KEYLAH, DARWISH (MRN [id number]) as of 03/30/2017 08:45  Ref. Range 03/29/2017 17:04 03/30/2017 05:13  Glucose Latest Ref Range: 65 - 99 mg/dL 117 (H) 235 (H)   Review of Glycemic Control  Diabetes history: No Outpatient Diabetes medications: NA Current orders for Inpatient glycemic control: None  Inpatient Diabetes Program Recommendations: Correction (SSI): While inpatient and ordered steroids, please consider ordering CBGs with Novolog correction scale ACHS.  Thanks, Barnie Alderman, RN, MSN, CDE Diabetes Coordinator Inpatient Diabetes Program [phone] (Team Pager from 8am to 5pm)

## 2017-03-30 NOTE — Care Management Note (Signed)
Case Management Note  Patient Details  Name: AMAZIAH GHOSH MRN: [id number] Date of Birth: 1941/06/04  Subjective/Objective:  Adm with acute bronchitis/PNA. From home with room mate. Ind PTA, has cane, RW and BSC PTA. She has PCP, still drives to appointments, and reports no issues affording medications.                 Action/Plan: Anticipate DC home with self care.    Expected Discharge Date:       03/31/2017           Expected Discharge Plan:  Home/Self Care  In-House Referral:     Discharge planning Services  CM Consult  Post Acute Care Choice:    Choice offered to:  NA  DME Arranged:    DME Agency:     HH Arranged:    HH Agency:     Status of Service:  In process, will continue to follow  If discussed at Long Length of Stay Meetings, dates discussed:    Additional Comments:  Camdyn Laden, Chauncey Reading, RN 03/30/2017, 11:32 AM

## 2017-03-30 NOTE — H&P (Signed)
[facility] H&P    Patient Demographics:    [name], is a 76 y.o. female  MRN: [id number]  DOB - July 14, 1941  Admit Date - 03/29/2017  Referring MD/NP/PA: Dr Lita Mains  Outpatient Primary MD for the patient is Octavio Graves, DO  Patient coming from: home  Chief Complaint  Patient presents with  . Cough      HPI:    [name]  is a 76 y.o. female, with history of HIV, chronic kidney disease stage III, hypertension who came to hospital with 1 week history ofProductive cough. Patient also complains of coughing up pink sputum 2 days ago. Also composed of shortness of breath on exertion. Has occasional central chest pain and abdominal pain from constant coughing. Patient has history of HIV and last viral load 2 months ago was undetectable.  She denies nausea vomiting or diarrhea. No fever or dysuria. Patient recently was diagnosed with UTI and treated with Keflex.   Review of systems:      A full 10 point Review of Systems was done, except as stated above, all other Review of Systems were negative.   With Past History of the following :    Past Medical History:  Diagnosis Date  . Anxiety   . Arthritis   . Bronchitis   . CKD (chronic kidney disease) stage 3, GFR 30-59 ml/min 05/04/2016  . COPD (chronic obstructive pulmonary disease) ([place])   . Depression   . Elevated LFTs 05/04/2016  . History of TIAs 1981   left side weakness  . HIV (human immunodeficiency virus infection) ([place])   . Hypertension   . Kidney disease related to HIV infection [facility])    pt states she has to have her creatinine level checked often   . Nausea 03/03/2017  . Peripheral neuropathy    bilateral feet  . UTI (urinary tract infection) 03/03/2017  . Weight loss 03/03/2017      Past Surgical History:  Procedure Laterality Date  . ABDOMINAL HYSTERECTOMY  1985  . BACK SURGERY    . CATARACT EXTRACTION, BILATERAL    .  CHOLECYSTECTOMY    . COLONOSCOPY  08/10/2002   NUR: Normal colonoscopy except for some hemorrhoids and some erythema at the dentate line  . COLONOSCOPY  12/10/2006   [place]:Diffuse colitis in the transverse and descending colon/This is not entirely typical of ischemic colitis or her HIV positive/ disease.  We need to be concerned about other causes  . COLONOSCOPY  01/19/2011   RMR: Internal and external hemorrhoids, likely source of hematochezia anal papilla, otherwise normal rectal mucosa/ Left-sided diverticula.  Cecal polyp with status post cold snare polypectomy.  Remainder of colonic mucosa appeared normal  . ESOPHAGOGASTRODUODENOSCOPY  08/10/2002     NUR: Mild changes of reflux esophagitis limited to gastroesophageal junction  No evidence of ring, stricture, or esophageal candidiasis dysphagia/ Gastritis, possibly H. pylori induced  . ESOPHAGOGASTRODUODENOSCOPY  12/25/2002   NUR: Erosive antral gastritis.  The degree of gastritis is more significant than her last exam/ Normal examination of the esophagus/ Esophageal dilatation performed  by passing 44 and [place]  . ESOPHAGOGASTRODUODENOSCOPY  11/13/2010   RMR: Circumferential distal esophageal erosions with soft peptic stricture, consistent with erosive reflux esophagitis or stricture  formation, status post dilation as described above/  Small hiatal hernia/ Tiny antral erosions, otherwise normal stomach D1 and D2  . HEMORRHOID SURGERY  09/02/2012   Procedure: HEMORRHOIDECTOMY;  Surgeon: Jamesetta So, MD;  Location: [facility];  Service: General;  Laterality: N/A;  . TUBAL LIGATION        Social History:      Social History  Substance Use Topics  . Smoking status: Never Smoker  . Smokeless tobacco: Never Used  . Alcohol use No       Family History :     Family History  Problem Relation Age of Onset  . Diabetes Brother       Home Medications:   Prior to Admission medications   Medication Sig Start Date End Date  Taking? Authorizing Provider  albuterol (PROVENTIL HFA;VENTOLIN HFA) 108 (90 BASE) MCG/ACT inhaler Inhale 2 puffs into the lungs every 6 (six) hours as needed. For shortness of breath   Yes [provider]  albuterol (PROVENTIL) (2.5 MG/3ML) 0.083% nebulizer solution Take 2.5 mg by nebulization every 6 (six) hours as needed for wheezing or shortness of breath.   Yes [provider]  amitriptyline (ELAVIL) 50 MG tablet Take 50 mg by mouth at bedtime.    Yes [provider]  cephALEXin (KEFLEX) 500 MG capsule Take 1 capsule (500 mg total) by mouth 2 (two) times daily. 03/03/17  Yes [name], Lavell Islam, MD  Cholecalciferol (VITAMIN D3) 10000 units TABS Take 1 tablet by mouth daily.   Yes [provider]  co-enzyme Q-10 30 MG capsule Take 30 mg by mouth daily.    Yes [provider]  diphenhydrAMINE (BENADRYL) 25 mg capsule Take 25 mg by mouth daily as needed for itching (itching from hydrocodone).   Yes [provider]  furosemide (LASIX) 40 MG tablet Take 40 mg by mouth daily as needed for fluid.    Yes [provider]  levothyroxine (SYNTHROID, LEVOTHROID) 25 MCG tablet Take 25 mcg by mouth daily. 02/03/17  Yes [provider]  nebivolol (BYSTOLIC) 10 MG tablet Take 10 mg by mouth daily.    Yes [provider]  ondansetron (ZOFRAN) 8 MG tablet Take 8 mg by mouth daily as needed for nausea or vomiting.    Yes [provider]  Oxycodone HCl 10 MG TABS Take 10 mg by mouth 4 (four) times daily as needed. For pain 03/25/17  Yes [provider]  polyvinyl alcohol (LIQUIFILM TEARS) 1.4 % ophthalmic solution Place 1 drop into both eyes as needed for dry eyes.   Yes [provider]  thiamine (VITAMIN B-1) 100 MG tablet Take 100 mg by mouth daily.   Yes [provider]  TRIUMEQ 600-50-300 MG tablet TAKE (1) TABLET BY MOUTH ONCE DAILY. 01/27/17  Yes [name], Lavell Islam, MD  VITAMIN B COMPLEX-C PO Take  1 capsule by mouth daily.   Yes [provider]     Allergies:     Allergies  Allergen Reactions  . Bee Venom Anaphylaxis  . Tenofovir Disoproxil Fumarate [Tenofovir Disoproxil Fumarate] Other (See Comments)    Renal failure, (08/27/2006)  . Codeine Itching and Nausea And Vomiting  . Haloperidol Lactate Other (See Comments)    Reaction is muscle tension, causes severe spasms in face and neck. Forces  eyes to roll in the back of the head  . Morphine Itching and Swelling  . Navane [Thiothixene] Other (See Comments) and Nausea And Vomiting    Same muscle spasm reaction as haldol  . Prochlorperazine Nausea And Vomiting  . Promethazine Hcl Other (See Comments)    Same as haldol   . Propoxyphene N-Acetaminophen Itching and Nausea And Vomiting     Physical Exam:   Vitals  Blood pressure (!) 176/75, pulse 67, temperature 98.3 F (36.8 C), temperature source Oral, resp. rate (!) 22, height 5\' 4"  (1.626 m), weight 99.3 kg (219 lb), SpO2 92 %.  1.  General: Appears in no acute distress  2. Psychiatric:  Intact judgement and  insight, awake alert, oriented x 3.  3. Neurologic: No focal neurological deficits, all cranial nerves intact.Strength 5/5 all 4 extremities, sensation intact all 4 extremities, plantars down going.  4. Eyes :  anicteric sclerae, moist conjunctivae with no lid lag. PERRLA.  5. ENMT:  Oropharynx clear with moist mucous membranes and good dentition  6. Neck:  supple, no cervical lymphadenopathy appriciated, No thyromegaly  7. Respiratory : Normal respiratory effort, bilateral rhonchi  8. Cardiovascular : RRR, no gallops, rubs or murmurs,   9. Gastrointestinal:  Positive bowel sounds, abdomen soft, non-tender to palpation,no hepatosplenomegaly, no rigidity or guarding       10. Skin:  No cyanosis, normal texture and turgor, no rash, lesions or ulcers  11.Musculoskeletal:  Good muscle tone,  joints appear normal , no effusions,  normal range  of motion    Data Review:    CBC  Recent Labs Lab 03/29/17 1704  WBC 7.2  HGB 15.1*  HCT 43.5  PLT 160  MCV 101.4*  MCH 35.2*  MCHC 34.7  RDW 12.9  LYMPHSABS 2.0  MONOABS 0.9  EOSABS 0.3  BASOSABS 0.0   ------------------------------------------------------------------------------------------------------------------  Chemistries   Recent Labs Lab 03/29/17 1704  NA 140  K 3.8  CL 103  CO2 27  GLUCOSE 117*  BUN 13  CREATININE 1.42*  CALCIUM 9.9  AST 55*  ALT 67*  ALKPHOS 67  BILITOT 1.0   ------------------------------------------------------------------------------------------------------------------  ------------------------------------------------------------------------------------------------------------------ GFR: Estimated Creatinine Clearance: 38.6 mL/min (A) (by C-G formula based on SCr of 1.42 mg/dL (H)). Liver Function Tests:  Recent Labs Lab 03/29/17 1704  AST 55*  ALT 67*  ALKPHOS 67  BILITOT 1.0  PROT 7.4  ALBUMIN 4.3    --------------------------------------------------------------------------------------------------------------- Urine analysis:    Component Value Date/Time   COLORURINE YELLOW 03/29/2017 [place] 03/29/2017 1643   LABSPEC 1.012 03/29/2017 1643   PHURINE 7.0 03/29/2017 1643   GLUCOSEU NEGATIVE 03/29/2017 1643   GLUCOSEU NEG mg/dL 09/20/2007 1114   HGBUR NEGATIVE 03/29/2017 1643   HGBUR moderate 06/04/2010 1425   BILIRUBINUR NEGATIVE 03/29/2017 1643   KETONESUR NEGATIVE 03/29/2017 1643   PROTEINUR 100 (A) 03/29/2017 1643   UROBILINOGEN 1.0 05/31/2014 0933   NITRITE NEGATIVE 03/29/2017 1643   LEUKOCYTESUR NEGATIVE 03/29/2017 1643      Imaging Results:    Dg Chest 2 View  Result Date: 03/29/2017 CLINICAL DATA:  Shortness of breath.  Productive cough. EXAM: CHEST  2 VIEW COMPARISON:  07/22/2016 FINDINGS: The heart size and mediastinal contours are within normal limits. Both lungs are clear.  The visualized skeletal structures are unremarkable. IMPRESSION: No active cardiopulmonary disease. Electronically Signed   By: Lorriane Shire M.D.   On: 03/29/2017 17:39    My personal review of EKG: Rhythm NSR   Assessment &  Plan:    Active Problems:   Human immunodeficiency virus (HIV) disease ([facility])   HTN (hypertension)   Acute bronchitis   1. Acute bronchitis- place under observation, start Levaquin per pharmacy, DuoNeb nebulizers every 6 hours, Solu-Medrol 60 mg IV every 6 hours. Chest x-ray showed no acute abnormality 2. History of HIV- undetectable viral load 2 months ago, continue antiretroviral therapy. Patient followed by ID as outpatient 3. Hypertension- blood pressure elevated, continue Bystolic, will start hydralazine 25 mg every 6 hours when necessary for BP more than 160/100. 4. Hypothyroidism- continue Synthroid   DVT Prophylaxis-   Lovenox  AM Labs Ordered, also please review Full Orders  Family Communication: Admission, patients condition and plan of care including tests being ordered have been discussed with the patient who indicate understanding and agree with the plan and Code Status.  Code Status:  Full code  Admission status: Observation    Time spent in minutes : 60 minutes   Rashika Bettes S M.D on 03/30/2017 at 1:00 AM  Between 7am to 7pm - Pager - [phone]. After 7pm go to [email or web address] - password [facility]  [facility] - Office  [phone]

## 2017-03-30 NOTE — Progress Notes (Signed)
Pt IV removed, tolerated well.  Reviewed discharge instructions with pt and answered all questions at this time.

## 2017-04-01 ENCOUNTER — Emergency Department (HOSPITAL_COMMUNITY)
Admission: EM | Admit: 2017-04-01 | Discharge: 2017-04-01 | Disposition: A | Discharge status: Home / Self Care | Payer: Medicare Other | Attending: Emergency Medicine | Admitting: Emergency Medicine

## 2017-04-01 ENCOUNTER — Encounter (HOSPITAL_COMMUNITY): Payer: Self-pay | Admitting: Emergency Medicine

## 2017-04-01 DIAGNOSIS — Z79899 Other long term (current) drug therapy: Secondary | ICD-10-CM | POA: Diagnosis not present

## 2017-04-01 DIAGNOSIS — I1310 Hypertensive heart and chronic kidney disease without heart failure, with stage 1 through stage 4 chronic kidney disease, or unspecified chronic kidney disease: Secondary | ICD-10-CM | POA: Insufficient documentation

## 2017-04-01 DIAGNOSIS — N183 Chronic kidney disease, stage 3 (moderate): Secondary | ICD-10-CM | POA: Diagnosis not present

## 2017-04-01 DIAGNOSIS — R443 Hallucinations, unspecified: Secondary | ICD-10-CM | POA: Diagnosis present

## 2017-04-01 DIAGNOSIS — B20 Human immunodeficiency virus [HIV] disease: Secondary | ICD-10-CM | POA: Insufficient documentation

## 2017-04-01 DIAGNOSIS — F23 Brief psychotic disorder: Secondary | ICD-10-CM | POA: Diagnosis not present

## 2017-04-01 LAB — URINALYSIS, ROUTINE W REFLEX MICROSCOPIC
Bilirubin Urine: NEGATIVE
Glucose, UA: NEGATIVE mg/dL
Hgb urine dipstick: NEGATIVE
Ketones, ur: NEGATIVE mg/dL
LEUKOCYTES UA: NEGATIVE
Nitrite: NEGATIVE
PROTEIN: NEGATIVE mg/dL
Specific Gravity, Urine: 1.009 (ref 1.005–1.030)
pH: 5 (ref 5.0–8.0)

## 2017-04-01 LAB — COMPREHENSIVE METABOLIC PANEL
ALT: 75 U/L — ABNORMAL HIGH (ref 14–54)
ANION GAP: 13 (ref 5–15)
AST: 62 U/L — ABNORMAL HIGH (ref 15–41)
Albumin: 4.9 g/dL (ref 3.5–5.0)
Alkaline Phosphatase: 68 U/L (ref 38–126)
BILIRUBIN TOTAL: 0.9 mg/dL (ref 0.3–1.2)
BUN: 51 mg/dL — ABNORMAL HIGH (ref 6–20)
CHLORIDE: 99 mmol/L — AB (ref 101–111)
CO2: 26 mmol/L (ref 22–32)
Calcium: 10.5 mg/dL — ABNORMAL HIGH (ref 8.9–10.3)
Creatinine, Ser: 1.89 mg/dL — ABNORMAL HIGH (ref 0.44–1.00)
GFR calc Af Amer: 29 mL/min — ABNORMAL LOW (ref >60)
GFR, EST NON AFRICAN AMERICAN: 25 mL/min — AB (ref >60)
Glucose, Bld: 105 mg/dL — ABNORMAL HIGH (ref 65–99)
POTASSIUM: 3.5 mmol/L (ref 3.5–5.1)
Sodium: 138 mmol/L (ref 135–145)
TOTAL PROTEIN: 8.3 g/dL — AB (ref 6.5–8.1)

## 2017-04-01 LAB — CBC WITH DIFFERENTIAL/PLATELET
Basophils Absolute: 0 10*3/uL (ref 0.0–0.1)
Basophils Relative: 0 %
EOS PCT: 0 %
Eosinophils Absolute: 0 10*3/uL (ref 0.0–0.7)
HEMATOCRIT: 46.5 % — AB (ref 36.0–46.0)
Hemoglobin: 16.5 g/dL — ABNORMAL HIGH (ref 12.0–15.0)
Lymphocytes Relative: 25 %
Lymphs Abs: 3.2 10*3/uL (ref 0.7–4.0)
MCH: 35.9 pg — ABNORMAL HIGH (ref 26.0–34.0)
MCHC: 35.5 g/dL (ref 30.0–36.0)
MCV: 101.1 fL — AB (ref 78.0–100.0)
MONO ABS: 1.3 10*3/uL — AB (ref 0.1–1.0)
MONOS PCT: 10 %
NEUTROS ABS: 8.2 10*3/uL — AB (ref 1.7–7.7)
Neutrophils Relative %: 65 %
PLATELETS: 214 10*3/uL (ref 150–400)
RBC: 4.6 MIL/uL (ref 3.87–5.11)
RDW: 12.9 % (ref 11.5–15.5)
WBC: 12.7 10*3/uL — ABNORMAL HIGH (ref 4.0–10.5)

## 2017-04-01 NOTE — ED Provider Notes (Signed)
[place] DEPT Provider Note   CSN: [id number] Arrival date & time: 04/01/17  0321     History   Chief Complaint Chief Complaint  Patient presents with  . Hallucinations    HPI [name] is a 76 y.o. female.  HPI  The patient is a 76 year old female with a history of rhonchi at his, stage III chronic kidney disease and COPD who also has HIV, presented to the hospital several days ago with a complaint of shortness of breath and was admitted to the hospital receiving bronchodilator therapy, steroid therapy and Levaquin. She was discharged 48 hours ago. She states that yesterday she was having some difficulty with mild hallucinations, this morning it got worse, she states that she was seeing snakes which was scary to her. There was no other auditory hallucinations and she states that she is not seeing anything abnormal at this time.  She has no other symptoms and states that her breathing is much improved from 2 days ago - she continues to take her prednisone / Levaquin.  No fevers, chills, headache, sore throat, visual changes, neck pain, back pain, chest pain, abdominal pain, shortness of breath, cough, dysuria, diarrhea, rectal bleeding, swelling, rashes, numbness or weakness.   Past Medical History:  Diagnosis Date  . Anxiety   . Arthritis   . Bronchitis   . CKD (chronic kidney disease) stage 3, GFR 30-59 ml/min 05/04/2016  . COPD (chronic obstructive pulmonary disease) ([place])   . Depression   . Elevated LFTs 05/04/2016  . History of TIAs 1981   left side weakness  . HIV (human immunodeficiency virus infection) ([place])   . Hypertension   . Kidney disease related to HIV infection [facility])    pt states she has to have her creatinine level checked often   . Nausea 03/03/2017  . Peripheral neuropathy    bilateral feet  . UTI (urinary tract infection) 03/03/2017  . Weight loss 03/03/2017    Patient Active Problem List   Diagnosis Date Noted  . Acute bronchitis 03/30/2017  .  UTI (urinary tract infection) 03/03/2017  . Nausea 03/03/2017  . Weight loss 03/03/2017  . Fever 07/22/2016  . CAP (community acquired pneumonia) 07/22/2016  . CKD (chronic kidney disease) stage 3, GFR 30-59 ml/min 05/04/2016  . Elevated LFTs 05/04/2016  . 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 Breath shortness 06/19/2014  . Syncope 05/31/2014  . Rib fracture 05/31/2014  . Syncope and collapse 05/31/2014  . Weight gain 03/21/2014  . Cervical spondylosis 08/15/2013  . Neck pain on left side 05/24/2013  . Unspecified vitamin D deficiency 12/14/2012  . Vitamin D toxicity 12/14/2012  . Hemorrhoid 09/01/2012  . Myalgia 02/25/2012  . HTN (hypertension) 11/25/2011  . Cramps, muscle, general 08/18/2011  . RECTAL BLEEDING 11/03/2010  . DYSPHAGIA 11/03/2010  . FATIGUE 08/29/2010  . MUSCLE PAIN 06/04/2010  . Acute cystitis 05/12/2010  . RENAL INSUFFICIENCY 12/18/2008  . DEPRESSION 09/04/2008  . HYPERLIPIDEMIA 05/22/2008  . IRRITABLE BOWEL SYNDROME 05/22/2008  . HERNIATED CERVICAL DISC 10/05/2007  . HERNIATED LUMBAR DISC 10/05/2007  . History of urinary tract infection 09/20/2007  . HIV-1 associated autonomic neuropathy ([place]) 03/16/2007  . ELBOW PAIN 03/16/2007  . ISCHEMIC COLITIS 12/27/2006  . RENAL FAILURE NOS 12/27/2006  . Human immunodeficiency virus (HIV) disease ([place]) 08/27/2006    Past Surgical History:  Procedure Laterality Date  . ABDOMINAL HYSTERECTOMY  1985  . BACK SURGERY    . CATARACT EXTRACTION, BILATERAL    . CHOLECYSTECTOMY    .  COLONOSCOPY  08/10/2002   NUR: Normal colonoscopy except for some hemorrhoids and some erythema at the dentate line  . COLONOSCOPY  12/10/2006   Edwards:Diffuse colitis in the transverse and descending colon/This is not entirely typical of ischemic colitis or her HIV positive/ disease.  We need to be concerned about other causes  . COLONOSCOPY  01/19/2011   RMR: Internal and external hemorrhoids, likely source of hematochezia anal papilla,  otherwise normal rectal mucosa/ Left-sided diverticula.  Cecal polyp with status post cold snare polypectomy.  Remainder of colonic mucosa appeared normal  . ESOPHAGOGASTRODUODENOSCOPY  08/10/2002     NUR: Mild changes of reflux esophagitis limited to gastroesophageal junction  No evidence of ring, stricture, or esophageal candidiasis dysphagia/ Gastritis, possibly H. pylori induced  . ESOPHAGOGASTRODUODENOSCOPY  12/25/2002   NUR: Erosive antral gastritis.  The degree of gastritis is more significant than her last exam/ Normal examination of the esophagus/ Esophageal dilatation performed by passing 60 and 33 [place]  . ESOPHAGOGASTRODUODENOSCOPY  11/13/2010   RMR: Circumferential distal esophageal erosions with soft peptic stricture, consistent with erosive reflux esophagitis or stricture  formation, status post dilation as described above/  Small hiatal hernia/ Tiny antral erosions, otherwise normal stomach D1 and D2  . HEMORRHOID SURGERY  09/02/2012   Procedure: HEMORRHOIDECTOMY;  Surgeon: Jamesetta So, MD;  Location: [facility];  Service: General;  Laterality: N/A;  . TUBAL LIGATION      OB History    Gravida Para Term Preterm AB Living   4 3 2 1 1 3    SAB TAB Ectopic Multiple Live Births   1               Home Medications    Prior to Admission medications   Medication Sig Start Date End Date Taking? Authorizing Provider  albuterol (PROVENTIL HFA;VENTOLIN HFA) 108 (90 BASE) MCG/ACT inhaler Inhale 2 puffs into the lungs every 6 (six) hours as needed. For shortness of breath    [provider]  albuterol (PROVENTIL) (2.5 MG/3ML) 0.083% nebulizer solution Take 2.5 mg by nebulization every 6 (six) hours as needed for wheezing or shortness of breath.    [provider]  amitriptyline (ELAVIL) 50 MG tablet Take 50 mg by mouth at bedtime.     [provider]  Cholecalciferol (VITAMIN D3) 10000 units TABS Take 1 tablet by mouth daily.    [provider]    co-enzyme Q-10 30 MG capsule Take 30 mg by mouth daily.     [provider]  diphenhydrAMINE (BENADRYL) 25 mg capsule Take 25 mg by mouth daily as needed for itching (itching from hydrocodone).    [provider]  furosemide (LASIX) 40 MG tablet Take 40 mg by mouth daily as needed for fluid.     [provider]  guaiFENesin (MUCINEX) 600 MG 12 hr tablet Take 1 tablet (600 mg total) by mouth 2 (two) times daily. 03/30/17   Kathie Dike, MD  levofloxacin (LEVAQUIN) 500 MG tablet Take 1 tablet (500 mg total) by mouth daily. 03/30/17   Kathie Dike, MD  levothyroxine (SYNTHROID, LEVOTHROID) 25 MCG tablet Take 25 mcg by mouth daily. 02/03/17   [provider]  nebivolol (BYSTOLIC) 10 MG tablet Take 10 mg by mouth daily.     [provider]  ondansetron (ZOFRAN) 8 MG tablet Take 8 mg by mouth daily as needed for nausea or vomiting.     [provider]  Oxycodone HCl 10 MG TABS Take 10  mg by mouth 4 (four) times daily as needed. For pain 03/25/17   [provider]  polyvinyl alcohol (LIQUIFILM TEARS) 1.4 % ophthalmic solution Place 1 drop into both eyes as needed for dry eyes.    [provider]  thiamine (VITAMIN B-1) 100 MG tablet Take 100 mg by mouth daily.    [provider]  TRIUMEQ 600-50-300 MG tablet TAKE (1) TABLET BY MOUTH ONCE DAILY. 01/27/17   Truman Hayward, MD  VITAMIN B COMPLEX-C PO Take 1 capsule by mouth daily.    [provider]    Family History Family History  Problem Relation Age of Onset  . Diabetes Brother     Social History Social History  Substance Use Topics  . Smoking status: Never Smoker  . Smokeless tobacco: Never Used  . Alcohol use No     Allergies   Bee venom; Tenofovir disoproxil fumarate [tenofovir disoproxil fumarate]; Codeine; Haloperidol lactate; Morphine; Navane [thiothixene]; Prednisone; Prochlorperazine; Promethazine hcl; and Propoxyphene  n-acetaminophen   Review of Systems Review of Systems  All other systems reviewed and are negative.    Physical Exam Updated Vital Signs BP (!) 184/98   Pulse 81   Temp 98.2 F (36.8 C) (Oral)   Resp 19   Ht 5\' 2"  (1.575 m)   Wt 89.8 kg (198 lb)   SpO2 95%   BMI 36.21 kg/m   Physical Exam  Constitutional: She appears well-developed and well-nourished. No distress.  HENT:  Head: Normocephalic and atraumatic.  Mouth/Throat: Oropharynx is clear and moist. No oropharyngeal exudate.  Eyes: Conjunctivae and EOM are normal. Pupils are equal, round, and reactive to light. Right eye exhibits no discharge. Left eye exhibits no discharge. No scleral icterus.  Neck: Normal range of motion. Neck supple. No JVD present. No thyromegaly present.  Cardiovascular: Normal rate, regular rhythm, normal heart sounds and intact distal pulses.  Exam reveals no gallop and no friction rub.   No murmur heard. Pulmonary/Chest: Effort normal. No respiratory distress. She has wheezes ( Mild and expiratory wheezing but speaks in full sentences without distress). She has no rales.  Abdominal: Soft. Bowel sounds are normal. She exhibits no distension and no mass. There is no tenderness.  Musculoskeletal: Normal range of motion. She exhibits no edema or tenderness.  Lymphadenopathy:    She has no cervical adenopathy.  Neurological: She is alert. Coordination normal.  The patient is awake, alert, she is able to follow all commands without difficulty, her speech is clear, she is oriented, she is not having any difficulty with coordination or speech or vision  Skin: Skin is warm and dry. No rash noted. No erythema.  Psychiatric: She has a normal mood and affect. Her behavior is normal.  The patient has a normal affect and mood, she has no anxiety, she has no hallucinations at this time  Nursing note and vitals reviewed.    ED Treatments / Results  Labs (all labs ordered are listed, but only abnormal  results are displayed) Labs Reviewed  CBC WITH DIFFERENTIAL/PLATELET - Abnormal; Notable for the following:       Result Value   WBC 12.7 (*)    Hemoglobin 16.5 (*)    HCT 46.5 (*)    MCV 101.1 (*)    MCH 35.9 (*)    Neutro Abs 8.2 (*)    Monocytes Absolute 1.3 (*)    All other components within normal limits  COMPREHENSIVE METABOLIC PANEL - Abnormal; Notable for the following:  Chloride 99 (*)    Glucose, Bld 105 (*)    BUN 51 (*)    Creatinine, Ser 1.89 (*)    Calcium 10.5 (*)    Total Protein 8.3 (*)    AST 62 (*)    ALT 75 (*)    GFR calc non Af Amer 25 (*)    GFR calc Af Amer 29 (*)    All other components within normal limits  URINALYSIS, ROUTINE W REFLEX MICROSCOPIC - Abnormal; Notable for the following:    APPearance HAZY (*)    All other components within normal limits   Radiology No results found.  Procedures Procedures (including critical care time)  Medications Ordered in ED Medications - No data to display   Initial Impression / Assessment and Plan / ED Course  I have reviewed the triage vital signs and the nursing notes.  Pertinent labs & imaging results that were available during my care of the patient were reviewed by me and considered in my medical decision making (see chart for details).     I suspect that the patient is having a steroid psychosis, it is very mild at this time she is not having any symptoms. I will check a few other tests including a metabolic panel, the patient is in agreement with this plan.  The patient's testing his looked unremarkable, she does have renal insufficiency but this is chronic, she has no signs of infection on urinalysis, she does have a slight leukocytosis which I would expect given her steroid use.  Her presentation is consistent with a steroid psychosis. She is no longer hallucinating, on reexam she feels better. She states that she feels a little bit agitated which is also consistent with a steroid side effect.  I have encouraged her to stop using prednisone, she is stable for discharge.  Final Clinical Impressions(s) / ED Diagnoses   Final diagnoses:  Psychosis, brief reactive    New Prescriptions Current Discharge Medication List       Noemi Chapel, MD 04/01/17 1105

## 2017-04-01 NOTE — ED Triage Notes (Signed)
Patient brought in by EMS with complaint of insomnia x 2 days. States "I seen snakes everywhere some of them as big as my leg."

## 2017-04-01 NOTE — Discharge Instructions (Signed)
Stop Prednisone immediately Continue to take albuterol breathing treatments every 4 hours for shortness of breath or wheezing Continue your antibiotic Please call your doctor to let them know about your change and [place] she get home Return to the emergency department for severe or worsening symptoms.

## 2017-04-02 ENCOUNTER — Emergency Department (HOSPITAL_COMMUNITY): Payer: Medicare Other

## 2017-04-02 ENCOUNTER — Emergency Department (HOSPITAL_COMMUNITY)
Admission: EM | Admit: 2017-04-02 | Discharge: 2017-04-02 | Disposition: A | Discharge status: Home / Self Care | Payer: Medicare Other | Attending: Emergency Medicine | Admitting: Emergency Medicine

## 2017-04-02 ENCOUNTER — Encounter (HOSPITAL_COMMUNITY): Payer: Self-pay | Admitting: *Deleted

## 2017-04-02 DIAGNOSIS — I129 Hypertensive chronic kidney disease with stage 1 through stage 4 chronic kidney disease, or unspecified chronic kidney disease: Secondary | ICD-10-CM | POA: Insufficient documentation

## 2017-04-02 DIAGNOSIS — B20 Human immunodeficiency virus [HIV] disease: Secondary | ICD-10-CM | POA: Insufficient documentation

## 2017-04-02 DIAGNOSIS — Z7951 Long term (current) use of inhaled steroids: Secondary | ICD-10-CM | POA: Diagnosis not present

## 2017-04-02 DIAGNOSIS — J449 Chronic obstructive pulmonary disease, unspecified: Secondary | ICD-10-CM | POA: Insufficient documentation

## 2017-04-02 DIAGNOSIS — N183 Chronic kidney disease, stage 3 (moderate): Secondary | ICD-10-CM | POA: Diagnosis not present

## 2017-04-02 DIAGNOSIS — R05 Cough: Secondary | ICD-10-CM | POA: Diagnosis present

## 2017-04-02 DIAGNOSIS — Z79899 Other long term (current) drug therapy: Secondary | ICD-10-CM | POA: Insufficient documentation

## 2017-04-02 DIAGNOSIS — J40 Bronchitis, not specified as acute or chronic: Secondary | ICD-10-CM

## 2017-04-02 MED ORDER — AZITHROMYCIN 250 MG PO TABS: ORAL_TABLET | ORAL | 0 refills | Status: DC

## 2017-04-02 MED ORDER — ALBUTEROL SULFATE HFA 108 (90 BASE) MCG/ACT IN AERS: 1.0000 | INHALATION_SPRAY | Freq: Four times a day (QID) | RESPIRATORY_TRACT | 0 refills | Status: DC | PRN

## 2017-04-02 MED ORDER — IPRATROPIUM-ALBUTEROL 0.5-2.5 (3) MG/3ML IN SOLN
3.0000 mL | Freq: Once | RESPIRATORY_TRACT | Status: AC
  Administered 2017-04-02: 3 mL via RESPIRATORY_TRACT
  Filled 2017-04-02: qty 3

## 2017-04-02 MED ORDER — AZITHROMYCIN 250 MG PO TABS
500.0000 mg | ORAL_TABLET | Freq: Once | ORAL | Status: AC
  Administered 2017-04-02: 500 mg via ORAL
  Filled 2017-04-02: qty 2

## 2017-04-02 NOTE — Discharge Instructions (Signed)
Chest x-ray shows no pneumonia. Start a new antibiotic tomorrow evening. Increase fluids. Prescription for inhaler.

## 2017-04-02 NOTE — ED Notes (Signed)
Pt alert & oriented x4, stable gait. Patient given discharge instructions, paperwork & prescription(s). Patient verbalized understanding. Pt left department in wheelchair escorted by staff. Pt left w/ no further questions. 

## 2017-04-02 NOTE — ED Triage Notes (Signed)
Seen yesterday for congestion, states she is worse

## 2017-04-04 NOTE — ED Provider Notes (Signed)
[place] DEPT Provider Note   CSN: [id number] Arrival date & time: 04/02/17  1503     History   Chief Complaint Chief Complaint  Patient presents with  . Cough    HPI [name] is a 76 y.o. female.  Patient presents with persistent cough with productive sputum. Patient had similar symptoms only worse on 03/29/17 leading to admission. No fever, sweats, chills. She has multiple health problems including chronic kidney disease, HIV, TIAs, hypertension.  She claims she is not currently taking any antibiotic. Severity of symptoms is moderate. She is eating and drinking.      Past Medical History:  Diagnosis Date  . Anxiety   . Arthritis   . Bronchitis   . CKD (chronic kidney disease) stage 3, GFR 30-59 ml/min 05/04/2016  . COPD (chronic obstructive pulmonary disease) ([place])   . Depression   . Elevated LFTs 05/04/2016  . History of TIAs 1981   left side weakness  . HIV (human immunodeficiency virus infection) ([place])   . Hypertension   . Kidney disease related to HIV infection [facility])    pt states she has to have her creatinine level checked often   . Nausea 03/03/2017  . Peripheral neuropathy    bilateral feet  . UTI (urinary tract infection) 03/03/2017  . Weight loss 03/03/2017    Patient Active Problem List   Diagnosis Date Noted  . Acute bronchitis 03/30/2017  . UTI (urinary tract infection) 03/03/2017  . Nausea 03/03/2017  . Weight loss 03/03/2017  . Fever 07/22/2016  . CAP (community acquired pneumonia) 07/22/2016  . CKD (chronic kidney disease) stage 3, GFR 30-59 ml/min 05/04/2016  . Elevated LFTs 05/04/2016  . 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 Breath shortness 06/19/2014  . Syncope 05/31/2014  . Rib fracture 05/31/2014  . Syncope and collapse 05/31/2014  . Weight gain 03/21/2014  . Cervical spondylosis 08/15/2013  . Neck pain on left side 05/24/2013  . Unspecified vitamin D deficiency 12/14/2012  . Vitamin D toxicity 12/14/2012  . Hemorrhoid 09/01/2012    . Myalgia 02/25/2012  . HTN (hypertension) 11/25/2011  . Cramps, muscle, general 08/18/2011  . RECTAL BLEEDING 11/03/2010  . DYSPHAGIA 11/03/2010  . FATIGUE 08/29/2010  . MUSCLE PAIN 06/04/2010  . Acute cystitis 05/12/2010  . RENAL INSUFFICIENCY 12/18/2008  . DEPRESSION 09/04/2008  . HYPERLIPIDEMIA 05/22/2008  . IRRITABLE BOWEL SYNDROME 05/22/2008  . HERNIATED CERVICAL DISC 10/05/2007  . HERNIATED LUMBAR DISC 10/05/2007  . History of urinary tract infection 09/20/2007  . HIV-1 associated autonomic neuropathy ([place]) 03/16/2007  . ELBOW PAIN 03/16/2007  . ISCHEMIC COLITIS 12/27/2006  . RENAL FAILURE NOS 12/27/2006  . Human immunodeficiency virus (HIV) disease ([place]) 08/27/2006    Past Surgical History:  Procedure Laterality Date  . ABDOMINAL HYSTERECTOMY  1985  . BACK SURGERY    . CATARACT EXTRACTION, BILATERAL    . CHOLECYSTECTOMY    . COLONOSCOPY  08/10/2002   NUR: Normal colonoscopy except for some hemorrhoids and some erythema at the dentate line  . COLONOSCOPY  12/10/2006   Edwards:Diffuse colitis in the transverse and descending colon/This is not entirely typical of ischemic colitis or her HIV positive/ disease.  We need to be concerned about other causes  . COLONOSCOPY  01/19/2011   RMR: Internal and external hemorrhoids, likely source of hematochezia anal papilla, otherwise normal rectal mucosa/ Left-sided diverticula.  Cecal polyp with status post cold snare polypectomy.  Remainder of colonic mucosa appeared normal  . ESOPHAGOGASTRODUODENOSCOPY  08/10/2002  NUR: Mild changes of reflux esophagitis limited to gastroesophageal junction  No evidence of ring, stricture, or esophageal candidiasis dysphagia/ Gastritis, possibly H. pylori induced  . ESOPHAGOGASTRODUODENOSCOPY  12/25/2002   NUR: Erosive antral gastritis.  The degree of gastritis is more significant than her last exam/ Normal examination of the esophagus/ Esophageal dilatation performed by passing 59 and 109  [place]  . ESOPHAGOGASTRODUODENOSCOPY  11/13/2010   RMR: Circumferential distal esophageal erosions with soft peptic stricture, consistent with erosive reflux esophagitis or stricture  formation, status post dilation as described above/  Small hiatal hernia/ Tiny antral erosions, otherwise normal stomach D1 and D2  . HEMORRHOID SURGERY  09/02/2012   Procedure: HEMORRHOIDECTOMY;  Surgeon: Jamesetta So, MD;  Location: [facility];  Service: General;  Laterality: N/A;  . TUBAL LIGATION      OB History    Gravida Para Term Preterm AB Living   4 3 2 1 1 3    SAB TAB Ectopic Multiple Live Births   1               Home Medications    Prior to Admission medications   Medication Sig Start Date End Date Taking? Authorizing Provider  Cholecalciferol (VITAMIN D3) 10000 units TABS Take 1 tablet by mouth daily.   Yes [provider]  co-enzyme Q-10 30 MG capsule Take 30 mg by mouth daily.    Yes [provider]  thiamine (VITAMIN B-1) 100 MG tablet Take 100 mg by mouth daily.   Yes [provider]  albuterol (PROVENTIL HFA;VENTOLIN HFA) 108 (90 Base) MCG/ACT inhaler Inhale 1-2 puffs into the lungs every 6 (six) hours as needed for wheezing or shortness of breath. 04/02/17   Nat Christen, MD  amitriptyline (ELAVIL) 50 MG tablet Take 50 mg by mouth at bedtime.     [provider]  azithromycin (ZITHROMAX Z-PAK) 250 MG tablet 1 tablet daily starting Saturday evening for 4 more days 04/02/17   Nat Christen, MD  diphenhydrAMINE (BENADRYL) 25 mg capsule Take 25 mg by mouth daily as needed for itching (itching from hydrocodone).    [provider]  furosemide (LASIX) 40 MG tablet Take 40 mg by mouth daily as needed for fluid.     [provider]  guaiFENesin (MUCINEX) 600 MG 12 hr tablet Take 1 tablet (600 mg total) by mouth 2 (two) times daily. 03/30/17   Kathie Dike, MD  ipratropium-albuterol (DUONEB) 0.5-2.5 (3) MG/3ML SOLN  04/02/17   [provider]  levofloxacin (LEVAQUIN) 500 MG tablet Take 1 tablet (500 mg total) by mouth daily. 03/30/17   Kathie Dike, MD  levothyroxine (SYNTHROID, LEVOTHROID) 25 MCG tablet Take 25 mcg by mouth daily. 02/03/17   [provider]  nebivolol (BYSTOLIC) 10 MG tablet Take 10 mg by mouth daily.     [provider]  ondansetron (ZOFRAN) 8 MG tablet Take 8 mg by mouth daily as needed for nausea or vomiting.     [provider]  Oxycodone HCl 10 MG TABS Take 10 mg by mouth 4 (four) times daily as needed. For pain 03/25/17   [provider]  polyvinyl alcohol (LIQUIFILM TEARS) 1.4 % ophthalmic solution Place 1 drop into both eyes as needed for dry eyes.    [provider]  predniSONE (DELTASONE) 10 MG tablet  03/30/17   [provider]  TRIUMEQ 600-50-300 MG tablet TAKE (1) TABLET BY MOUTH ONCE DAILY. 01/27/17   Truman Hayward, MD  Family History Family History  Problem Relation Age of Onset  . Diabetes Brother     Social History Social History  Substance Use Topics  . Smoking status: Never Smoker  . Smokeless tobacco: Never Used  . Alcohol use No     Allergies   Bee venom; Tenofovir disoproxil fumarate [tenofovir disoproxil fumarate]; Codeine; Haloperidol lactate; Morphine; Navane [thiothixene]; Prednisone; Prochlorperazine; Promethazine hcl; and Propoxyphene n-acetaminophen   Review of Systems Review of Systems  All other systems reviewed and are negative.    Physical Exam Updated Vital Signs BP 140/82 (BP Location: Right Arm)   Pulse 74   Temp 97.8 F (36.6 C) (Oral)   Resp (!) 22   Wt 89.8 kg (198 lb)   SpO2 97%   BMI 36.21 kg/m   Physical Exam  Constitutional: She is oriented to person, place, and time.  No respiratory distress.  HENT:  Head: Normocephalic and atraumatic.  Eyes: Conjunctivae are normal.  Neck: Neck supple.  Cardiovascular: Normal rate and regular rhythm.   Pulmonary/Chest:  Scattered  rhonchi and wheezing.  Abdominal: Soft. Bowel sounds are normal.  Musculoskeletal: Normal range of motion.  Neurological: She is alert and oriented to person, place, and time.  Skin: Skin is warm and dry.  Psychiatric: She has a normal mood and affect. Her behavior is normal.  Nursing note and vitals reviewed.    ED Treatments / Results  Labs (all labs ordered are listed, but only abnormal results are displayed) Labs Reviewed - No data to display  EKG  EKG Interpretation None       Radiology Dg Chest 2 View  Result Date: 04/02/2017 CLINICAL DATA:  Worsening productive cough and central CP tonight. Pt states she has been to the ED x 3 this week already due to cough. 1 CXR earlier this week. Hx HIV, COPD, CKD, HTN, nonsmoker EXAM: CHEST  2 VIEW COMPARISON:  03/29/2017 FINDINGS: Lateral view degraded by patient arm position. Patient rotated minimally right. Midline trachea. Mild cardiomegaly. No pleural effusion or pneumothorax. Mild volume loss and scarring or subsegmental atelectasis at the left lung base. This is unchanged. IMPRESSION: No acute cardiopulmonary disease. In Cardiomegaly without congestive failure. Electronically Signed   By: Abigail Miyamoto M.D.   On: 04/02/2017 18:38    Procedures Procedures (including critical care time)  Medications Ordered in ED Medications  ipratropium-albuterol (DUONEB) 0.5-2.5 (3) MG/3ML nebulizer solution 3 mL (3 mLs Nebulization Given 04/02/17 1839)  azithromycin (ZITHROMAX) tablet 500 mg (500 mg Oral Given 04/02/17 2001)     Initial Impression / Assessment and Plan / ED Course  I have reviewed the triage vital signs and the nursing notes.  Pertinent labs & imaging results that were available during my care of the patient were reviewed by me and considered in my medical decision making (see chart for details).     Patient is in no acute distress. Chest x-ray shows no pneumonia or heart failure. Will start Zithromax  Final Clinical  Impressions(s) / ED Diagnoses   Final diagnoses:  Chronic obstructive pulmonary disease, unspecified COPD type ([place])  Bronchitis    New Prescriptions Discharge Medication List as of 04/02/2017  8:59 PM    START taking these medications   Details  azithromycin (ZITHROMAX Z-PAK) 250 MG tablet 1 tablet daily starting Saturday evening for 4 more days, Print         Nat Christen, MD 04/04/17 1725

## 2017-08-23 ENCOUNTER — Other Ambulatory Visit: Payer: Medicare Other

## 2017-08-23 DIAGNOSIS — R110 Nausea: Secondary | ICD-10-CM

## 2017-08-23 DIAGNOSIS — N183 Chronic kidney disease, stage 3 unspecified: Secondary | ICD-10-CM

## 2017-08-23 DIAGNOSIS — Z113 Encounter for screening for infections with a predominantly sexual mode of transmission: Secondary | ICD-10-CM

## 2017-08-23 DIAGNOSIS — R634 Abnormal weight loss: Secondary | ICD-10-CM

## 2017-08-23 DIAGNOSIS — Z87440 Personal history of urinary (tract) infections: Secondary | ICD-10-CM

## 2017-08-23 DIAGNOSIS — B20 Human immunodeficiency virus [HIV] disease: Secondary | ICD-10-CM

## 2017-08-23 DIAGNOSIS — N3000 Acute cystitis without hematuria: Secondary | ICD-10-CM

## 2017-08-23 DIAGNOSIS — G909 Disorder of the autonomic nervous system, unspecified: Secondary | ICD-10-CM

## 2017-08-23 DIAGNOSIS — Z79899 Other long term (current) drug therapy: Secondary | ICD-10-CM

## 2017-08-24 LAB — COMPLETE METABOLIC PANEL WITH GFR
AG Ratio: 1.9 (calc) (ref 1.0–2.5)
ALBUMIN MSPROF: 4 g/dL (ref 3.6–5.1)
ALT: 32 U/L — ABNORMAL HIGH (ref 6–29)
AST: 37 U/L — AB (ref 10–35)
Alkaline phosphatase (APISO): 83 U/L (ref 33–130)
BUN/Creatinine Ratio: 16 (calc) (ref 6–22)
BUN: 26 mg/dL — AB (ref 7–25)
CALCIUM: 10 mg/dL (ref 8.6–10.4)
CO2: 25 mmol/L (ref 20–32)
CREATININE: 1.59 mg/dL — AB (ref 0.60–0.93)
Chloride: 106 mmol/L (ref 98–110)
GFR, EST NON AFRICAN AMERICAN: 31 mL/min/{1.73_m2} — AB (ref >=60)
GFR, Est African American: 36 mL/min/{1.73_m2} — ABNORMAL LOW (ref >=60)
GLOBULIN: 2.1 g/dL (ref 1.9–3.7)
GLUCOSE: 82 mg/dL (ref 65–99)
Potassium: 4.9 mmol/L (ref 3.5–5.3)
Sodium: 141 mmol/L (ref 135–146)
Total Bilirubin: 1.1 mg/dL (ref 0.2–1.2)
Total Protein: 6.1 g/dL (ref 6.1–8.1)

## 2017-08-24 LAB — CBC WITH DIFFERENTIAL/PLATELET
Basophils Absolute: 51 cells/uL (ref 0–200)
Basophils Relative: 0.7 %
Eosinophils Absolute: 241 cells/uL (ref 15–500)
Eosinophils Relative: 3.3 %
HEMATOCRIT: 36.1 % (ref 35.0–45.0)
HEMOGLOBIN: 12.6 g/dL (ref 11.7–15.5)
LYMPHS ABS: 2482 {cells}/uL (ref 850–3900)
MCH: 34.6 pg — ABNORMAL HIGH (ref 27.0–33.0)
MCHC: 34.9 g/dL (ref 32.0–36.0)
MCV: 99.2 fL (ref 80.0–100.0)
MPV: 9.2 fL (ref 7.5–12.5)
Monocytes Relative: 9.9 %
Neutro Abs: 3803 cells/uL (ref 1500–7800)
Neutrophils Relative %: 52.1 %
Platelets: 176 10*3/uL (ref 140–400)
RBC: 3.64 10*6/uL — AB (ref 3.80–5.10)
RDW: 13.4 % (ref 11.0–15.0)
Total Lymphocyte: 34 %
WBC mixed population: 723 cells/uL (ref 200–950)
WBC: 7.3 10*3/uL (ref 3.8–10.8)

## 2017-08-24 LAB — LIPID PANEL
CHOL/HDL RATIO: 9 (calc) — AB (ref <5.0)
Cholesterol: 225 mg/dL — ABNORMAL HIGH (ref <200)
HDL: 25 mg/dL — AB (ref >50)
LDL CHOLESTEROL (CALC): 146 mg/dL — AB
NON-HDL CHOLESTEROL (CALC): 200 mg/dL — AB (ref <130)
Triglycerides: 352 mg/dL — ABNORMAL HIGH (ref <150)

## 2017-08-24 LAB — T-HELPER CELL (CD4) - (RCID CLINIC ONLY)
CD4 T CELL ABS: 670 /uL (ref 400–2700)
CD4 T CELL HELPER: 25 % — AB (ref 33–55)

## 2017-08-24 LAB — RPR: RPR: NONREACTIVE

## 2017-08-25 LAB — HIV-1 RNA QUANT-NO REFLEX-BLD
HIV 1 RNA QUANT: NOT DETECTED {copies}/mL
HIV-1 RNA QUANT, LOG: NOT DETECTED {Log_copies}/mL

## 2017-09-06 ENCOUNTER — Ambulatory Visit: Payer: Medicare Other | Admitting: Infectious Disease

## 2017-09-27 ENCOUNTER — Ambulatory Visit: Payer: Self-pay | Admitting: Infectious Disease

## 2017-09-29 ENCOUNTER — Other Ambulatory Visit (HOSPITAL_COMMUNITY)
Admission: RE | Admit: 2017-09-29 | Discharge: 2017-09-29 | Disposition: A | Discharge status: Home / Self Care | Payer: Medicare Other | Source: Other Acute Inpatient Hospital | Attending: Urology | Admitting: Urology

## 2017-09-29 ENCOUNTER — Ambulatory Visit (INDEPENDENT_AMBULATORY_CARE_PROVIDER_SITE_OTHER): Payer: Medicare Other | Admitting: Urology

## 2017-09-29 DIAGNOSIS — R300 Dysuria: Secondary | ICD-10-CM

## 2017-09-29 DIAGNOSIS — N3021 Other chronic cystitis with hematuria: Secondary | ICD-10-CM

## 2017-10-02 LAB — URINE CULTURE

## 2017-11-24 DIAGNOSIS — G8929 Other chronic pain: Secondary | ICD-10-CM | POA: Diagnosis not present

## 2017-11-24 DIAGNOSIS — M5137 Other intervertebral disc degeneration, lumbosacral region: Secondary | ICD-10-CM | POA: Diagnosis not present

## 2017-11-24 DIAGNOSIS — E782 Mixed hyperlipidemia: Secondary | ICD-10-CM | POA: Diagnosis not present

## 2017-11-24 DIAGNOSIS — K219 Gastro-esophageal reflux disease without esophagitis: Secondary | ICD-10-CM | POA: Diagnosis not present

## 2017-11-24 DIAGNOSIS — M1A0710 Idiopathic chronic gout, right ankle and foot, without tophus (tophi): Secondary | ICD-10-CM | POA: Diagnosis not present

## 2017-11-24 DIAGNOSIS — F418 Other specified anxiety disorders: Secondary | ICD-10-CM | POA: Diagnosis not present

## 2017-11-24 DIAGNOSIS — Z6838 Body mass index (BMI) 38.0-38.9, adult: Secondary | ICD-10-CM | POA: Diagnosis not present

## 2017-11-24 DIAGNOSIS — B20 Human immunodeficiency virus [HIV] disease: Secondary | ICD-10-CM | POA: Diagnosis not present

## 2017-11-24 DIAGNOSIS — M1711 Unilateral primary osteoarthritis, right knee: Secondary | ICD-10-CM | POA: Diagnosis not present

## 2017-12-14 DIAGNOSIS — J189 Pneumonia, unspecified organism: Secondary | ICD-10-CM | POA: Diagnosis not present

## 2017-12-16 ENCOUNTER — Encounter (HOSPITAL_COMMUNITY): Payer: Self-pay | Admitting: *Deleted

## 2017-12-16 ENCOUNTER — Observation Stay (HOSPITAL_COMMUNITY)
Admission: EM | Admit: 2017-12-16 | Discharge: 2017-12-19 | Disposition: A | Discharge status: Home / Self Care | Payer: Medicare HMO | Attending: Internal Medicine | Admitting: Internal Medicine

## 2017-12-16 ENCOUNTER — Other Ambulatory Visit: Payer: Self-pay

## 2017-12-16 DIAGNOSIS — Y999 Unspecified external cause status: Secondary | ICD-10-CM | POA: Diagnosis not present

## 2017-12-16 DIAGNOSIS — J449 Chronic obstructive pulmonary disease, unspecified: Secondary | ICD-10-CM | POA: Diagnosis not present

## 2017-12-16 DIAGNOSIS — S0990XA Unspecified injury of head, initial encounter: Secondary | ICD-10-CM | POA: Diagnosis present

## 2017-12-16 DIAGNOSIS — I10 Essential (primary) hypertension: Secondary | ICD-10-CM | POA: Diagnosis not present

## 2017-12-16 DIAGNOSIS — W1830XA Fall on same level, unspecified, initial encounter: Secondary | ICD-10-CM | POA: Diagnosis not present

## 2017-12-16 DIAGNOSIS — B20 Human immunodeficiency virus [HIV] disease: Secondary | ICD-10-CM | POA: Insufficient documentation

## 2017-12-16 DIAGNOSIS — W228XXA Striking against or struck by other objects, initial encounter: Secondary | ICD-10-CM | POA: Insufficient documentation

## 2017-12-16 DIAGNOSIS — S8001XA Contusion of right knee, initial encounter: Secondary | ICD-10-CM | POA: Diagnosis not present

## 2017-12-16 DIAGNOSIS — E039 Hypothyroidism, unspecified: Secondary | ICD-10-CM | POA: Diagnosis present

## 2017-12-16 DIAGNOSIS — F19951 Other psychoactive substance use, unspecified with psychoactive substance-induced psychotic disorder with hallucinations: Secondary | ICD-10-CM

## 2017-12-16 DIAGNOSIS — Y92003 Bedroom of unspecified non-institutional (private) residence as the place of occurrence of the external cause: Secondary | ICD-10-CM | POA: Diagnosis not present

## 2017-12-16 DIAGNOSIS — N1832 Chronic kidney disease, stage 3b: Secondary | ICD-10-CM | POA: Diagnosis present

## 2017-12-16 DIAGNOSIS — Z79899 Other long term (current) drug therapy: Secondary | ICD-10-CM | POA: Insufficient documentation

## 2017-12-16 DIAGNOSIS — G9340 Encephalopathy, unspecified: Secondary | ICD-10-CM | POA: Diagnosis not present

## 2017-12-16 DIAGNOSIS — N183 Chronic kidney disease, stage 3 unspecified: Secondary | ICD-10-CM | POA: Diagnosis present

## 2017-12-16 DIAGNOSIS — Y9389 Activity, other specified: Secondary | ICD-10-CM | POA: Diagnosis not present

## 2017-12-16 DIAGNOSIS — Z049 Encounter for examination and observation for unspecified reason: Secondary | ICD-10-CM | POA: Diagnosis not present

## 2017-12-16 DIAGNOSIS — S0003XA Contusion of scalp, initial encounter: Secondary | ICD-10-CM | POA: Diagnosis not present

## 2017-12-16 DIAGNOSIS — Z21 Asymptomatic human immunodeficiency virus [HIV] infection status: Secondary | ICD-10-CM | POA: Diagnosis present

## 2017-12-16 DIAGNOSIS — W19XXXA Unspecified fall, initial encounter: Secondary | ICD-10-CM

## 2017-12-16 DIAGNOSIS — I129 Hypertensive chronic kidney disease with stage 1 through stage 4 chronic kidney disease, or unspecified chronic kidney disease: Secondary | ICD-10-CM | POA: Diagnosis not present

## 2017-12-16 DIAGNOSIS — M79605 Pain in left leg: Secondary | ICD-10-CM | POA: Diagnosis not present

## 2017-12-16 DIAGNOSIS — S8991XA Unspecified injury of right lower leg, initial encounter: Secondary | ICD-10-CM | POA: Diagnosis not present

## 2017-12-16 DIAGNOSIS — R51 Headache: Secondary | ICD-10-CM | POA: Diagnosis not present

## 2017-12-16 DIAGNOSIS — Y92009 Unspecified place in unspecified non-institutional (private) residence as the place of occurrence of the external cause: Secondary | ICD-10-CM

## 2017-12-16 NOTE — ED Triage Notes (Signed)
Pt brought in by [facility] for c/o headache and right knee pain; pt states she fell around 10:30 tonight; pt states she was getting up to go to her bedroom and lost her balance and fell against the closet door, hitting the top of her head and her right knee; pt has a bump to the top of her head and bruising and swelling to her right knee; pt states she went to her PCP earlier in the week and was prescribed prednisone and states she has been hallucinating since then

## 2017-12-17 ENCOUNTER — Emergency Department (HOSPITAL_COMMUNITY): Payer: Medicare HMO

## 2017-12-17 ENCOUNTER — Other Ambulatory Visit: Payer: Self-pay

## 2017-12-17 ENCOUNTER — Encounter (HOSPITAL_COMMUNITY): Payer: Self-pay | Admitting: Family Medicine

## 2017-12-17 DIAGNOSIS — F19951 Other psychoactive substance use, unspecified with psychoactive substance-induced psychotic disorder with hallucinations: Secondary | ICD-10-CM | POA: Diagnosis not present

## 2017-12-17 DIAGNOSIS — I10 Essential (primary) hypertension: Secondary | ICD-10-CM | POA: Diagnosis not present

## 2017-12-17 DIAGNOSIS — S0003XA Contusion of scalp, initial encounter: Secondary | ICD-10-CM | POA: Diagnosis not present

## 2017-12-17 DIAGNOSIS — N183 Chronic kidney disease, stage 3 (moderate): Secondary | ICD-10-CM | POA: Diagnosis not present

## 2017-12-17 DIAGNOSIS — J449 Chronic obstructive pulmonary disease, unspecified: Secondary | ICD-10-CM | POA: Diagnosis present

## 2017-12-17 DIAGNOSIS — B20 Human immunodeficiency virus [HIV] disease: Secondary | ICD-10-CM

## 2017-12-17 DIAGNOSIS — S0990XA Unspecified injury of head, initial encounter: Secondary | ICD-10-CM | POA: Diagnosis not present

## 2017-12-17 DIAGNOSIS — G9340 Encephalopathy, unspecified: Secondary | ICD-10-CM | POA: Diagnosis not present

## 2017-12-17 DIAGNOSIS — E039 Hypothyroidism, unspecified: Secondary | ICD-10-CM | POA: Diagnosis not present

## 2017-12-17 DIAGNOSIS — S8991XA Unspecified injury of right lower leg, initial encounter: Secondary | ICD-10-CM | POA: Diagnosis not present

## 2017-12-17 LAB — CBC WITH DIFFERENTIAL/PLATELET
Basophils Absolute: 0 10*3/uL (ref 0.0–0.1)
Basophils Relative: 0 %
EOS ABS: 0 10*3/uL (ref 0.0–0.7)
EOS PCT: 0 %
HCT: 45.6 % (ref 36.0–46.0)
HEMOGLOBIN: 15.3 g/dL — AB (ref 12.0–15.0)
LYMPHS ABS: 2.8 10*3/uL (ref 0.7–4.0)
Lymphocytes Relative: 33 %
MCH: 34.2 pg — AB (ref 26.0–34.0)
MCHC: 33.6 g/dL (ref 30.0–36.0)
MCV: 101.8 fL — AB (ref 78.0–100.0)
MONOS PCT: 10 %
Monocytes Absolute: 0.8 10*3/uL (ref 0.1–1.0)
Neutro Abs: 5 10*3/uL (ref 1.7–7.7)
Neutrophils Relative %: 57 %
PLATELETS: 181 10*3/uL (ref 150–400)
RBC: 4.48 MIL/uL (ref 3.87–5.11)
RDW: 13.1 % (ref 11.5–15.5)
WBC: 8.6 10*3/uL (ref 4.0–10.5)

## 2017-12-17 LAB — COMPREHENSIVE METABOLIC PANEL
ALBUMIN: 4 g/dL (ref 3.5–5.0)
ALT: 20 U/L (ref 14–54)
AST: 19 U/L (ref 15–41)
Alkaline Phosphatase: 81 U/L (ref 38–126)
Anion gap: 11 (ref 5–15)
BUN: 38 mg/dL — ABNORMAL HIGH (ref 6–20)
CHLORIDE: 105 mmol/L (ref 101–111)
CO2: 24 mmol/L (ref 22–32)
CREATININE: 1.81 mg/dL — AB (ref 0.44–1.00)
Calcium: 10.3 mg/dL (ref 8.9–10.3)
GFR calc Af Amer: 30 mL/min — ABNORMAL LOW (ref >60)
GFR calc non Af Amer: 26 mL/min — ABNORMAL LOW (ref >60)
Glucose, Bld: 108 mg/dL — ABNORMAL HIGH (ref 65–99)
Potassium: 3.7 mmol/L (ref 3.5–5.1)
SODIUM: 140 mmol/L (ref 135–145)
Total Bilirubin: 0.6 mg/dL (ref 0.3–1.2)
Total Protein: 7.1 g/dL (ref 6.5–8.1)

## 2017-12-17 LAB — URINALYSIS, ROUTINE W REFLEX MICROSCOPIC
Bacteria, UA: NONE SEEN
Bilirubin Urine: NEGATIVE
Glucose, UA: NEGATIVE mg/dL
Hgb urine dipstick: NEGATIVE
Ketones, ur: NEGATIVE mg/dL
Leukocytes, UA: NEGATIVE
Nitrite: NEGATIVE
PROTEIN: 30 mg/dL — AB
Specific Gravity, Urine: 1.01 (ref 1.005–1.030)
pH: 6 (ref 5.0–8.0)

## 2017-12-17 LAB — TSH: TSH: 4.497 u[IU]/mL (ref 0.350–4.500)

## 2017-12-17 LAB — RAPID URINE DRUG SCREEN, HOSP PERFORMED
Amphetamines: NOT DETECTED
Barbiturates: NOT DETECTED
Benzodiazepines: POSITIVE — AB
Cocaine: NOT DETECTED
OPIATES: NOT DETECTED
Tetrahydrocannabinol: NOT DETECTED

## 2017-12-17 LAB — VITAMIN B12: VITAMIN B 12: 727 pg/mL (ref 180–914)

## 2017-12-17 LAB — AMMONIA: AMMONIA: 13 umol/L (ref 9–35)

## 2017-12-17 MED ORDER — ONDANSETRON HCL 4 MG/2ML IJ SOLN: 4.0000 mg | Freq: Four times a day (QID) | INTRAMUSCULAR | Status: DC | PRN

## 2017-12-17 MED ORDER — ABACAVIR-DOLUTEGRAVIR-LAMIVUD 600-50-300 MG PO TABS
1.0000 | ORAL_TABLET | Freq: Every day | ORAL | Status: DC
  Administered 2017-12-17 – 2017-12-19 (×3): 1 via ORAL
  Filled 2017-12-17 (×6): qty 1

## 2017-12-17 MED ORDER — ENSURE ENLIVE PO LIQD
237.0000 mL | Freq: Two times a day (BID) | ORAL | Status: DC
  Administered 2017-12-18 – 2017-12-19 (×2): 237 mL via ORAL

## 2017-12-17 MED ORDER — ACETAMINOPHEN 325 MG PO TABS: 650.0000 mg | ORAL_TABLET | Freq: Four times a day (QID) | ORAL | Status: DC | PRN

## 2017-12-17 MED ORDER — HEPARIN SODIUM (PORCINE) 5000 UNIT/ML IJ SOLN
5000.0000 [IU] | Freq: Three times a day (TID) | INTRAMUSCULAR | Status: DC
  Administered 2017-12-17 – 2017-12-19 (×7): 5000 [IU] via SUBCUTANEOUS
  Filled 2017-12-17 (×7): qty 1

## 2017-12-17 MED ORDER — LORAZEPAM 1 MG PO TABS
1.0000 mg | ORAL_TABLET | Freq: Two times a day (BID) | ORAL | Status: DC | PRN
  Administered 2017-12-17 – 2017-12-18 (×2): 1 mg via ORAL
  Filled 2017-12-17 (×2): qty 1

## 2017-12-17 MED ORDER — HYDRALAZINE HCL 20 MG/ML IJ SOLN: 10.0000 mg | INTRAMUSCULAR | Status: DC | PRN

## 2017-12-17 MED ORDER — POLYVINYL ALCOHOL 1.4 % OP SOLN
1.0000 [drp] | OPHTHALMIC | Status: DC | PRN
  Filled 2017-12-17: qty 15

## 2017-12-17 MED ORDER — VITAMIN D 1000 UNITS PO TABS
1000.0000 [IU] | ORAL_TABLET | Freq: Every day | ORAL | Status: DC
  Administered 2017-12-17 – 2017-12-19 (×3): 1000 [IU] via ORAL
  Filled 2017-12-17 (×6): qty 1

## 2017-12-17 MED ORDER — SODIUM CHLORIDE 0.9% FLUSH: 3.0000 mL | INTRAVENOUS | Status: DC | PRN

## 2017-12-17 MED ORDER — ONDANSETRON HCL 4 MG PO TABS: 4.0000 mg | ORAL_TABLET | Freq: Four times a day (QID) | ORAL | Status: DC | PRN

## 2017-12-17 MED ORDER — SENNOSIDES-DOCUSATE SODIUM 8.6-50 MG PO TABS
1.0000 | ORAL_TABLET | Freq: Every evening | ORAL | Status: DC | PRN
  Filled 2017-12-17: qty 1

## 2017-12-17 MED ORDER — ACETAMINOPHEN 325 MG PO TABS
650.0000 mg | ORAL_TABLET | Freq: Once | ORAL | Status: AC
  Administered 2017-12-17: 650 mg via ORAL
  Filled 2017-12-17: qty 2

## 2017-12-17 MED ORDER — ACETAMINOPHEN 650 MG RE SUPP: 650.0000 mg | Freq: Four times a day (QID) | RECTAL | Status: DC | PRN

## 2017-12-17 MED ORDER — SODIUM CHLORIDE 0.9% FLUSH
3.0000 mL | Freq: Two times a day (BID) | INTRAVENOUS | Status: DC
  Administered 2017-12-17: 10:00:00 via INTRAVENOUS
  Administered 2017-12-18 (×2): 3 mL via INTRAVENOUS

## 2017-12-17 MED ORDER — LEVOTHYROXINE SODIUM 25 MCG PO TABS
25.0000 ug | ORAL_TABLET | Freq: Every day | ORAL | Status: DC
  Administered 2017-12-17 – 2017-12-19 (×3): 25 ug via ORAL
  Filled 2017-12-17 (×3): qty 1

## 2017-12-17 MED ORDER — SODIUM CHLORIDE 0.9 % IV SOLN: 250.0000 mL | INTRAVENOUS | Status: DC | PRN

## 2017-12-17 MED ORDER — VITAMIN B-1 100 MG PO TABS
100.0000 mg | ORAL_TABLET | Freq: Every day | ORAL | Status: DC
Start: 2017-12-17 — End: 2017-12-19
  Administered 2017-12-17 – 2017-12-19 (×3): 100 mg via ORAL
  Filled 2017-12-17 (×3): qty 1

## 2017-12-17 MED ORDER — BISACODYL 5 MG PO TBEC: 5.0000 mg | DELAYED_RELEASE_TABLET | Freq: Every day | ORAL | Status: DC | PRN

## 2017-12-17 MED ORDER — SODIUM CHLORIDE 0.9 % IV SOLN
INTRAVENOUS | Status: DC
Start: 2017-12-17 — End: 2017-12-19
  Administered 2017-12-17 – 2017-12-18 (×2): via INTRAVENOUS

## 2017-12-17 MED ORDER — NEBIVOLOL HCL 10 MG PO TABS
10.0000 mg | ORAL_TABLET | Freq: Every day | ORAL | Status: DC
  Administered 2017-12-17 – 2017-12-19 (×3): 10 mg via ORAL
  Filled 2017-12-17 (×6): qty 1

## 2017-12-17 MED ORDER — HYDRALAZINE HCL 25 MG PO TABS
25.0000 mg | ORAL_TABLET | Freq: Three times a day (TID) | ORAL | Status: DC
  Administered 2017-12-17 – 2017-12-19 (×6): 25 mg via ORAL
  Filled 2017-12-17 (×6): qty 1

## 2017-12-17 MED ORDER — OXYCODONE HCL 5 MG PO TABS
10.0000 mg | ORAL_TABLET | Freq: Four times a day (QID) | ORAL | Status: DC | PRN
  Administered 2017-12-17 – 2017-12-19 (×4): 10 mg via ORAL
  Filled 2017-12-17 (×4): qty 2

## 2017-12-17 MED ORDER — IPRATROPIUM-ALBUTEROL 0.5-2.5 (3) MG/3ML IN SOLN
3.0000 mL | Freq: Four times a day (QID) | RESPIRATORY_TRACT | Status: DC | PRN
  Administered 2017-12-18: 3 mL via RESPIRATORY_TRACT
  Filled 2017-12-17: qty 3

## 2017-12-17 MED ORDER — AMITRIPTYLINE HCL 25 MG PO TABS
50.0000 mg | ORAL_TABLET | Freq: Every day | ORAL | Status: DC
  Administered 2017-12-17 – 2017-12-18 (×2): 50 mg via ORAL
  Filled 2017-12-17 (×5): qty 2

## 2017-12-17 NOTE — ED Notes (Signed)
Patient states she tripped while getting ready for bed, took a nose dive, striking head and injury right knee, patient also thinks she is having hallucinations from prednisone she started on Monday.

## 2017-12-17 NOTE — H&P (Signed)
History and Physical    [name] ZWC:[id number] DOB: Aug 02, 1941 DOA: 12/16/2017  PCP: Tanya Graves, DO   Patient coming from: Home  Chief Complaint: Fall, hallucinations, confusion  HPI: [name] is a 77 y.o. female with medical history significant for chronic kidney disease stage III, hypothyroidism, COPD, hypertension, and HIV, now presenting to the emergency department with hallucinations, intermittent confusion, and a fall at home.  Patient reports that she had been experiencing a productive cough with shortness of breath that began 2-3 weeks ago.  She saw her PCP for those complaints on 12/13/2017 and was prescribed prednisone and an antibiotic.  Later that day, or the next day, she developed intermittent confusion that continues now, as well as visual and auditory hallucinations.  She describes seeing snakes in her home and now reports that she can see her roommate in the emergency department though she acknowledges knowing that he is not actually there.  She suffered a ground-level mechanical fall just prior to arrival, striking her head against a closet door and experiencing pain at the right knee.  She has been able to bear weight since that time and there was no loss of consciousness.  She denies drug or alcohol use and denies falling or hitting her head prior to tonight.  ED Course: Upon arrival to the ED, patient is found to be afebrile, saturating well on room air, hypertensive to 190/90, and vitals otherwise normal.  EKG features a normal sinus rhythm and radiographs of the right knee are negative for acute findings.  Noncontrast head CT is negative for acute intracranial abnormality.  Chemistry panel reveals a creatinine of 1.81, similar to priors.  CBC is notable for mild macrocytosis without anemia.  Urinalysis is unremarkable.  Patient was treated with Tylenol in the ED.  She remains hemodynamically stable, in no apparent respiratory distress, and will be observed on the  medical-surgical unit for ongoing evaluation and management of intermittent confusion and hallucinations suspected secondary to prednisone.  Review of Systems:  All other systems reviewed and apart from HPI, are negative.  Past Medical History:  Diagnosis Date  . Anxiety   . Arthritis   . Bronchitis   . CKD (chronic kidney disease) stage 3, GFR 30-59 ml/min (HCC) 05/04/2016  . COPD (chronic obstructive pulmonary disease) ([place])   . Depression   . Elevated LFTs 05/04/2016  . History of TIAs 1981   left side weakness  . HIV (human immunodeficiency virus infection) ([place])   . Hypertension   . Kidney disease related to HIV infection [facility])    pt states she has to have her creatinine level checked often   . Nausea 03/03/2017  . Peripheral neuropathy    bilateral feet  . UTI (urinary tract infection) 03/03/2017  . Weight loss 03/03/2017    Past Surgical History:  Procedure Laterality Date  . ABDOMINAL HYSTERECTOMY  1985  . BACK SURGERY    . CATARACT EXTRACTION, BILATERAL    . CHOLECYSTECTOMY    . COLONOSCOPY  08/10/2002   NUR: Normal colonoscopy except for some hemorrhoids and some erythema at the dentate line  . COLONOSCOPY  12/10/2006   Edwards:Diffuse colitis in the transverse and descending colon/This is not entirely typical of ischemic colitis or her HIV positive/ disease.  We need to be concerned about other causes  . COLONOSCOPY  01/19/2011   RMR: Internal and external hemorrhoids, likely source of hematochezia anal papilla, otherwise normal rectal mucosa/ Left-sided diverticula.  Cecal polyp with status post  cold snare polypectomy.  Remainder of colonic mucosa appeared normal  . ESOPHAGOGASTRODUODENOSCOPY  08/10/2002     NUR: Mild changes of reflux esophagitis limited to gastroesophageal junction  No evidence of ring, stricture, or esophageal candidiasis dysphagia/ Gastritis, possibly H. pylori induced  . ESOPHAGOGASTRODUODENOSCOPY  12/25/2002   NUR: Erosive antral gastritis.  The  degree of gastritis is more significant than her last exam/ Normal examination of the esophagus/ Esophageal dilatation performed by passing 25 and [address] [place]  . ESOPHAGOGASTRODUODENOSCOPY  11/13/2010   RMR: Circumferential distal esophageal erosions with soft peptic stricture, consistent with erosive reflux esophagitis or stricture  formation, status post dilation as described above/  Small hiatal hernia/ Tiny antral erosions, otherwise normal stomach D1 and D2  . HEMORRHOID SURGERY  09/02/2012   Procedure: HEMORRHOIDECTOMY;  Surgeon: Jamesetta So, MD;  Location: [facility];  Service: General;  Laterality: N/A;  . TUBAL LIGATION       reports that  has never smoked. she has never used smokeless tobacco. She reports that she does not drink alcohol or use drugs.  Allergies  Allergen Reactions  . Bee Venom Anaphylaxis  . Tenofovir Disoproxil Fumarate [Tenofovir Disoproxil Fumarate] Other (See Comments)    Renal failure, (08/27/2006)  . Codeine Itching and Nausea And Vomiting  . Haloperidol Lactate Other (See Comments)    Reaction is muscle tension, causes severe spasms in face and neck. Forces eyes to roll in the back of the head  . Morphine Itching and Swelling  . Navane [Thiothixene] Other (See Comments) and Nausea And Vomiting    Same muscle spasm reaction as haldol  . Prednisone     Brief mild psychosis and agitation  . Prochlorperazine Nausea And Vomiting  . Promethazine Hcl Other (See Comments)    Same as haldol   . Propoxyphene N-Acetaminophen Itching and Nausea And Vomiting    Family History  Problem Relation Age of Onset  . Diabetes Brother      Prior to Admission medications   Medication Sig Start Date End Date Taking? Authorizing Provider  albuterol (PROVENTIL HFA;VENTOLIN HFA) 108 (90 Base) MCG/ACT inhaler Inhale 1-2 puffs into the lungs every 6 (six) hours as needed for wheezing or shortness of breath. 04/02/17   Nat Christen, MD  amitriptyline (ELAVIL) 50 MG tablet  Take 50 mg by mouth at bedtime.     [provider]  azithromycin (ZITHROMAX Z-PAK) 250 MG tablet 1 tablet daily starting Saturday evening for 4 more days 04/02/17   Nat Christen, MD  Cholecalciferol (VITAMIN D3) 10000 units TABS Take 1 tablet by mouth daily.    [provider]  co-enzyme Q-10 30 MG capsule Take 30 mg by mouth daily.     [provider]  diphenhydrAMINE (BENADRYL) 25 mg capsule Take 25 mg by mouth daily as needed for itching (itching from hydrocodone).    [provider]  furosemide (LASIX) 40 MG tablet Take 40 mg by mouth daily as needed for fluid.     [provider]  guaiFENesin (MUCINEX) 600 MG 12 hr tablet Take 1 tablet (600 mg total) by mouth 2 (two) times daily. 03/30/17   Kathie Dike, MD  ipratropium-albuterol (DUONEB) 0.5-2.5 (3) MG/3ML SOLN  04/02/17   [provider]  levothyroxine (SYNTHROID, LEVOTHROID) 25 MCG tablet Take 25 mcg by mouth daily. 02/03/17   [provider]  nebivolol (BYSTOLIC) 10 MG tablet Take 10 mg by mouth daily.     [provider]  ondansetron [facility]) 8  MG tablet Take 8 mg by mouth daily as needed for nausea or vomiting.     [provider]  Oxycodone HCl 10 MG TABS Take 10 mg by mouth 4 (four) times daily as needed. For pain 03/25/17   [provider]  polyvinyl alcohol (LIQUIFILM TEARS) 1.4 % ophthalmic solution Place 1 drop into both eyes as needed for dry eyes.    [provider]  predniSONE (DELTASONE) 10 MG tablet  03/30/17   [provider]  thiamine (VITAMIN B-1) 100 MG tablet Take 100 mg by mouth daily.    [provider]  TRIUMEQ 600-50-300 MG tablet TAKE (1) TABLET BY MOUTH ONCE DAILY. 01/27/17   Truman Hayward, MD    Physical Exam: Vitals:   12/17/17 0130 12/17/17 0300 12/17/17 0344 12/17/17 0430  BP: (!) 172/78 (!) 184/83 (!) 192/94 (!) 186/93  Pulse: 61  62 64  Resp: (!) 23 18 16 15   Temp:      TempSrc:      SpO2:  99%  98% 98%  Weight:      Height:          Constitutional: NAD, calm  Eyes: PERTLA, lids and conjunctivae normal ENMT: Mucous membranes are moist. Posterior pharynx clear of any exudate or lesions.   Neck: normal, supple, no masses, no thyromegaly Respiratory: Breath sounds slightly diminished bilaterally. No wheezes. No accessory muscle use.  Cardiovascular: S1 & S2 heard, regular rate and rhythm. No significant JVD. Abdomen: No distension, no tenderness, no masses palpated. Bowel sounds normal.  Musculoskeletal: no clubbing / cyanosis. No joint deformity upper and lower extremities.    Skin: no significant rashes, lesions, ulcers. Warm, dry, well-perfused. Neurologic: CN 2-12 grossly intact. Sensation intact. Strength 5/5 in all 4 limbs.  Psychiatric: Alert and oriented x 3. Reports active visual and auditory hallucinations. Pleasant.     Labs on Admission: I have personally reviewed following labs and imaging studies  CBC: Recent Labs  Lab 12/17/17 0503  WBC 8.6  NEUTROABS 5.0  HGB 15.3*  HCT 45.6  MCV 101.8*  PLT 378   Basic Metabolic Panel: Recent Labs  Lab 12/17/17 0503  NA 140  K 3.7  CL 105  CO2 24  GLUCOSE 108*  BUN 38*  CREATININE 1.81*  CALCIUM 10.3   GFR: Estimated Creatinine Clearance: 27.9 mL/min (A) (by C-G formula based on SCr of 1.81 mg/dL (H)). Liver Function Tests: Recent Labs  Lab 12/17/17 0503  AST 19  ALT 20  ALKPHOS 81  BILITOT 0.6  PROT 7.1  ALBUMIN 4.0   No results for input(s): LIPASE, AMYLASE in the last 168 hours. No results for input(s): AMMONIA in the last 168 hours. Coagulation Profile: No results for input(s): INR, PROTIME in the last 168 hours. Cardiac Enzymes: No results for input(s): CKTOTAL, CKMB, CKMBINDEX, TROPONINI in the last 168 hours. BNP (last 3 results) No results for input(s): PROBNP in the last 8760 hours. HbA1C: No results for input(s): HGBA1C in the last 72 hours. CBG: No results for input(s):  GLUCAP in the last 168 hours. Lipid Profile: No results for input(s): CHOL, HDL, LDLCALC, TRIG, CHOLHDL, LDLDIRECT in the last 72 hours. Thyroid Function Tests: No results for input(s): TSH, T4TOTAL, FREET4, T3FREE, THYROIDAB in the last 72 hours. Anemia Panel: No results for input(s): VITAMINB12, FOLATE, FERRITIN, TIBC, IRON, RETICCTPCT in the last 72 hours. Urine analysis:    Component Value Date/Time   COLORURINE YELLOW 12/17/2017 [place] 12/17/2017  0453   LABSPEC 1.010 12/17/2017 0453   PHURINE 6.0 12/17/2017 0453   GLUCOSEU NEGATIVE 12/17/2017 0453   GLUCOSEU NEG mg/dL 09/20/2007 1114   HGBUR NEGATIVE 12/17/2017 0453   HGBUR moderate 06/04/2010 1425   Orient 12/17/2017 [place] 12/17/2017 0453   PROTEINUR 30 (A) 12/17/2017 0453   UROBILINOGEN 1.0 05/31/2014 0933   NITRITE NEGATIVE 12/17/2017 0453   LEUKOCYTESUR NEGATIVE 12/17/2017 0453   Sepsis Labs: @LABRCNTIP (procalcitonin:4,lacticidven:4) )No results found for this or any previous visit (from the past 240 hour(s)).   Radiological Exams on Admission: Ct Head Wo Contrast  Result Date: 12/17/2017 CLINICAL DATA:  77 year old female with head trauma. EXAM: CT HEAD WITHOUT CONTRAST TECHNIQUE: Contiguous axial images were obtained from the base of the skull through the vertex without intravenous contrast. COMPARISON:  Head CT dated 05/31/2014 and MRI dated 08/21/2015 FINDINGS: Brain: There is moderate age-related atrophy and chronic microvascular ischemic changes. Old right basal ganglia infarct and encephalomalacia as seen on the prior MRI. There is no acute intracranial hemorrhage. No mass effect or midline shift. No extra-axial fluid collection. Vascular: No hyperdense vessel or unexpected calcification. Skull: Normal. Negative for fracture or focal lesion. Sinuses/Orbits: There is mild diffuse mucoperiosteal thickening of paranasal sinuses with partial opacification of multiple  ethmoid air cells. No air-fluid levels. Remote right lamina papyracea fracture. The mastoid air cells are clear. Other: None IMPRESSION: 1. No acute intracranial hemorrhage. 2. Moderate age-related atrophy and chronic microvascular ischemic changes. Old right basal ganglia infarct and encephalomalacia. 3. Old right lamina Propecia fractures and mild paranasal sinus disease. Electronically Signed   By: Anner Crete M.D.   On: 12/17/2017 03:04   Dg Knee Complete 4 Views Right  Result Date: 12/17/2017 CLINICAL DATA:  [place] at home EXAM: RIGHT KNEE - COMPLETE 4+ VIEW COMPARISON:  None. FINDINGS: No acute displaced fracture or malalignment. Mild to moderate degenerative changes of the medial and patellofemoral compartments. No large effusion. Lateral joint space calcification IMPRESSION: 1. No acute osseous abnormality 2. Degenerative changes 3. Chondrocalcinosis Electronically Signed   By: Donavan Foil M.D.   On: 12/17/2017 03:12    EKG: Independently reviewed. Normal sinus rhythm.   Assessment/Plan  1. Acute encephalopathy  - Presents after a fall at home, reports several days of hallucinations with intermittent confusion  - Head CT is negative for acute findings and no focal neurologic deficit is identified  - Suspected secondary to prednisone, started on 2/18 along with abx after seeing PCP for productive cough and SOB that has since resolved   - Stop prednisone, check ammonia, TSH, RPR, B12, and folate    2. COPD  - No cough, wheezing, or dyspnea  - Continue prn nebs    3. HIV  - CD4 670 and VL undetectable in late October 2018  - Continue Triumeq    4. CKD stage III  - SCr is 1.81 on admission, similar to priors  - Renally-dose medications, avoid nephrotoxins    5. Hypertension  - BP elevated in ED  - Continue nebivolol, use hydralazine IVP's prn   6. Hypothyroidism  - Check TSH given the presentation  - Continue Synthroid    DVT prophylaxis: sq heparin  Code Status: Full    Family Communication: Discussed with patient Disposition Plan: Observe on med-surg Consults called: None Admission status: Observation    Vianne Bulls, MD [facility] Pager [phone]  If 7PM-7AM, please contact night-coverage [email or web address] Password [facility]  12/17/2017, 6:17 AM

## 2017-12-17 NOTE — ED Provider Notes (Signed)
[facility] EMERGENCY DEPARTMENT Provider Note   CSN: [id number] Arrival date & time: 12/16/17  2338  Time seen 12:48 PM    History   Chief Complaint Chief Complaint  Patient presents with  . Headache    HPI [name] is a 77 y.o. female.  HPI patient states for a couple weeks she was coughing up green mucus and she was seen by her doctors on the 18th and was started on an unknown antibiotic and a?  Steroid or prednisone.  She states she is never taken it before however when I look at her chart she has been prescribed prednisone last summer.  She states she started having hallucinations stating she sees rats and snakes and hearing voices.  She states her roommate checks and nobody is there.  She states she has been unable to sleep.  She states tonight she was getting ready to take a bath and she went to her closet and she sort of felt dizzy and lightheaded and was hallucinating and thinks maybe she fainted and fell forward knocking the sliding door off her closet with her head and hitting her right knee.  She denies nausea, vomiting, blurred vision, numbness or tingling of her extremities.  She states her roommate heard her fall and it she was fully awake when he came in her room, she states she felt like she had loss of consciousness for 1-2 seconds.  She states when he arrived in the room she was crying.  PCP Octavio Graves, DO   Past Medical History:  Diagnosis Date  . Anxiety   . Arthritis   . Bronchitis   . CKD (chronic kidney disease) stage 3, GFR 30-59 ml/min (HCC) 05/04/2016  . COPD (chronic obstructive pulmonary disease) ([place])   . Depression   . Elevated LFTs 05/04/2016  . History of TIAs 1981   left side weakness  . HIV (human immunodeficiency virus infection) ([place])   . Hypertension   . Kidney disease related to HIV infection [facility])    pt states she has to have her creatinine level checked often   . Nausea 03/03/2017  . Peripheral neuropathy    bilateral feet  .  UTI (urinary tract infection) 03/03/2017  . Weight loss 03/03/2017    Patient Active Problem List   Diagnosis Date Noted  . COPD (chronic obstructive pulmonary disease) ([place]) 12/17/2017  . Acute bronchitis 03/30/2017  . UTI (urinary tract infection) 03/03/2017  . Nausea 03/03/2017  . Weight loss 03/03/2017  . Fever 07/22/2016  . CAP (community acquired pneumonia) 07/22/2016  . CKD (chronic kidney disease) stage 3, GFR 30-59 ml/min (HCC) 05/04/2016  . Elevated LFTs 05/04/2016  . 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 Breath shortness 06/19/2014  . Syncope 05/31/2014  . Rib fracture 05/31/2014  . Syncope and collapse 05/31/2014  . Weight gain 03/21/2014  . Cervical spondylosis 08/15/2013  . Neck pain on left side 05/24/2013  . Unspecified vitamin D deficiency 12/14/2012  . Vitamin D toxicity 12/14/2012  . Hemorrhoid 09/01/2012  . Myalgia 02/25/2012  . HTN (hypertension) 11/25/2011  . Cramps, muscle, general 08/18/2011  . RECTAL BLEEDING 11/03/2010  . DYSPHAGIA 11/03/2010  . FATIGUE 08/29/2010  . MUSCLE PAIN 06/04/2010  . Acute cystitis 05/12/2010  . RENAL INSUFFICIENCY 12/18/2008  . DEPRESSION 09/04/2008  . HYPERLIPIDEMIA 05/22/2008  . IRRITABLE BOWEL SYNDROME 05/22/2008  . HERNIATED CERVICAL DISC 10/05/2007  . HERNIATED LUMBAR DISC 10/05/2007  . History of urinary tract infection 09/20/2007  . HIV-1 associated autonomic neuropathy (  [place]) 03/16/2007  . ELBOW PAIN 03/16/2007  . ISCHEMIC COLITIS 12/27/2006  . RENAL FAILURE NOS 12/27/2006  . Human immunodeficiency virus (HIV) disease ([place]) 08/27/2006    Past Surgical History:  Procedure Laterality Date  . ABDOMINAL HYSTERECTOMY  1985  . BACK SURGERY    . CATARACT EXTRACTION, BILATERAL    . CHOLECYSTECTOMY    . COLONOSCOPY  08/10/2002   NUR: Normal colonoscopy except for some hemorrhoids and some erythema at the dentate line  . COLONOSCOPY  12/10/2006   Edwards:Diffuse colitis in the transverse and descending colon/This is  not entirely typical of ischemic colitis or her HIV positive/ disease.  We need to be concerned about other causes  . COLONOSCOPY  01/19/2011   RMR: Internal and external hemorrhoids, likely source of hematochezia anal papilla, otherwise normal rectal mucosa/ Left-sided diverticula.  Cecal polyp with status post cold snare polypectomy.  Remainder of colonic mucosa appeared normal  . ESOPHAGOGASTRODUODENOSCOPY  08/10/2002     NUR: Mild changes of reflux esophagitis limited to gastroesophageal junction  No evidence of ring, stricture, or esophageal candidiasis dysphagia/ Gastritis, possibly H. pylori induced  . ESOPHAGOGASTRODUODENOSCOPY  12/25/2002   NUR: Erosive antral gastritis.  The degree of gastritis is more significant than her last exam/ Normal examination of the esophagus/ Esophageal dilatation performed by passing 59 and [address] [place]  . ESOPHAGOGASTRODUODENOSCOPY  11/13/2010   RMR: Circumferential distal esophageal erosions with soft peptic stricture, consistent with erosive reflux esophagitis or stricture  formation, status post dilation as described above/  Small hiatal hernia/ Tiny antral erosions, otherwise normal stomach D1 and D2  . HEMORRHOID SURGERY  09/02/2012   Procedure: HEMORRHOIDECTOMY;  Surgeon: Jamesetta So, MD;  Location: [facility];  Service: General;  Laterality: N/A;  . TUBAL LIGATION      OB History    Gravida Para Term Preterm AB Living   4 3 2 1 1 3    SAB TAB Ectopic Multiple Live Births   1               Home Medications    Prior to Admission medications   Medication Sig Start Date End Date Taking? Authorizing Provider  albuterol (PROVENTIL HFA;VENTOLIN HFA) 108 (90 Base) MCG/ACT inhaler Inhale 1-2 puffs into the lungs every 6 (six) hours as needed for wheezing or shortness of breath. 04/02/17   Nat Christen, MD  amitriptyline (ELAVIL) 50 MG tablet Take 50 mg by mouth at bedtime.     [provider]  azithromycin (ZITHROMAX Z-PAK) 250 MG tablet 1  tablet daily starting Saturday evening for 4 more days 04/02/17   Nat Christen, MD  Cholecalciferol (VITAMIN D3) 10000 units TABS Take 1 tablet by mouth daily.    [provider]  co-enzyme Q-10 30 MG capsule Take 30 mg by mouth daily.     [provider]  diphenhydrAMINE (BENADRYL) 25 mg capsule Take 25 mg by mouth daily as needed for itching (itching from hydrocodone).    [provider]  furosemide (LASIX) 40 MG tablet Take 40 mg by mouth daily as needed for fluid.     [provider]  guaiFENesin (MUCINEX) 600 MG 12 hr tablet Take 1 tablet (600 mg total) by mouth 2 (two) times daily. 03/30/17   Kathie Dike, MD  ipratropium-albuterol (DUONEB) 0.5-2.5 (3) MG/3ML SOLN  04/02/17   [provider]  levofloxacin (LEVAQUIN) 500 MG tablet Take 1 tablet (500 mg total) by mouth daily. 03/30/17   Kathie Dike, MD  levothyroxine (SYNTHROID, LEVOTHROID) 25 MCG tablet Take 25 mcg by mouth daily. 02/03/17   [provider]  nebivolol (BYSTOLIC) 10 MG tablet Take 10 mg by mouth daily.     [provider]  ondansetron (ZOFRAN) 8 MG tablet Take 8 mg by mouth daily as needed for nausea or vomiting.     [provider]  Oxycodone HCl 10 MG TABS Take 10 mg by mouth 4 (four) times daily as needed. For pain 03/25/17   [provider]  polyvinyl alcohol (LIQUIFILM TEARS) 1.4 % ophthalmic solution Place 1 drop into both eyes as needed for dry eyes.    [provider]  predniSONE (DELTASONE) 10 MG tablet  03/30/17   [provider]  thiamine (VITAMIN B-1) 100 MG tablet Take 100 mg by mouth daily.    [provider]  TRIUMEQ 600-50-300 MG tablet TAKE (1) TABLET BY MOUTH ONCE DAILY. 01/27/17   Truman Hayward, MD    Family History Family History  Problem Relation Age of Onset  . Diabetes Brother     Social History Social History   Tobacco Use  . Smoking status: Never Smoker  . Smokeless tobacco: Never Used    Substance Use Topics  . Alcohol use: No  . Drug use: No  lives with a roommate   Allergies   Bee venom; Tenofovir disoproxil fumarate [tenofovir disoproxil fumarate]; Codeine; Haloperidol lactate; Morphine; Navane [thiothixene]; Prednisone; Prochlorperazine; Promethazine hcl; and Propoxyphene n-acetaminophen   Review of Systems Review of Systems  All other systems reviewed and are negative.    Physical Exam Updated Vital Signs BP (!) 172/78   Pulse 61   Temp 97.8 F (36.6 C) (Oral)   Resp (!) 23   Ht 5\' 4"  (1.626 m)   Wt 85.3 kg (188 lb)   SpO2 99%   BMI 32.27 kg/m   Vital signs normal Except for hypertension   Physical Exam  Constitutional: She is oriented to person, place, and time. She appears well-developed and well-nourished.  Non-toxic appearance. She does not appear ill. No distress.  HENT:  Head: Normocephalic and atraumatic.    Right Ear: External ear normal.  Left Ear: External ear normal.  Nose: Nose normal. No mucosal edema or rhinorrhea.  Mouth/Throat: Oropharynx is clear and moist and mucous membranes are normal. No dental abscesses or uvula swelling.  Eyes: Conjunctivae and EOM are normal. Pupils are equal, round, and reactive to light.  Neck: Normal range of motion and full passive range of motion without pain. Neck supple.  Cardiovascular: Normal rate, regular rhythm and normal heart sounds. Exam reveals no gallop and no friction rub.  No murmur heard. Pulmonary/Chest: Effort normal and breath sounds normal. No respiratory distress. She has no wheezes. She has no rhonchi. She has no rales. She exhibits no tenderness and no crepitus.  Abdominal: Soft. Normal appearance and bowel sounds are normal. She exhibits no distension. There is no tenderness. There is no rebound and no guarding.  Musculoskeletal: Normal range of motion. She exhibits no edema or tenderness.  Moves all extremities well.  Patient has some bruising on the anterior knee without  effusion, abrasion, or laceration.  Neurological: She is alert and oriented to person, place, and time. She has normal strength. No cranial nerve deficit.  Skin: Skin is warm, dry and intact. No rash noted. No erythema. No pallor.  Psychiatric: She has a normal mood and affect. Her speech is normal and behavior is normal. Her mood appears not  anxious.  Nursing note and vitals reviewed.    ED Treatments / Results  Labs (all labs ordered are listed, but only abnormal results are displayed) Labs Reviewed  URINALYSIS, ROUTINE W REFLEX MICROSCOPIC - Abnormal; Notable for the following components:      Result Value   Protein, ur 30 (*)    Squamous Epithelial / LPF 0-5 (*)    All other components within normal limits  COMPREHENSIVE METABOLIC PANEL - Abnormal; Notable for the following components:   Glucose, Bld 108 (*)    BUN 38 (*)    Creatinine, Ser 1.81 (*)    GFR calc non Af Amer 26 (*)    GFR calc Af Amer 30 (*)    All other components within normal limits  CBC WITH DIFFERENTIAL/PLATELET - Abnormal; Notable for the following components:   Hemoglobin 15.3 (*)    MCV 101.8 (*)    MCH 34.2 (*)    All other components within normal limits    EKG  EKG Interpretation  Date/Time:  Thursday December 16 2017 23:59:30 EST Ventricular Rate:  63 PR Interval:    QRS Duration: 95 QT Interval:  441 QTC Calculation: 452 R Axis:   15 Text Interpretation:  Sinus rhythm Normal ECG No significant change since last tracing 29 Mar 2017 Confirmed by Rolland Porter [phone]) on 12/17/2017 12:43:03 AM       Radiology Ct Head Wo Contrast  Result Date: 12/17/2017 CLINICAL DATA:  77 year old female with head trauma. EXAM: CT HEAD WITHOUT CONTRAST TECHNIQUE: Contiguous axial images were obtained from the base of the skull through the vertex without intravenous contrast. COMPARISON:  Head CT dated 05/31/2014 and MRI dated 08/21/2015 FINDINGS: Brain: There is moderate age-related atrophy and chronic  microvascular ischemic changes. Old right basal ganglia infarct and encephalomalacia as seen on the prior MRI. There is no acute intracranial hemorrhage. No mass effect or midline shift. No extra-axial fluid collection. Vascular: No hyperdense vessel or unexpected calcification. Skull: Normal. Negative for fracture or focal lesion. Sinuses/Orbits: There is mild diffuse mucoperiosteal thickening of paranasal sinuses with partial opacification of multiple ethmoid air cells. No air-fluid levels. Remote right lamina papyracea fracture. The mastoid air cells are clear. Other: None IMPRESSION: 1. No acute intracranial hemorrhage. 2. Moderate age-related atrophy and chronic microvascular ischemic changes. Old right basal ganglia infarct and encephalomalacia. 3. Old right lamina Propecia fractures and mild paranasal sinus disease. Electronically Signed   By: Anner Crete M.D.   On: 12/17/2017 03:04   Dg Knee Complete 4 Views Right  Result Date: 12/17/2017 CLINICAL DATA:  [place] at home EXAM: RIGHT KNEE - COMPLETE 4+ VIEW COMPARISON:  None. FINDINGS: No acute displaced fracture or malalignment. Mild to moderate degenerative changes of the medial and patellofemoral compartments. No large effusion. Lateral joint space calcification IMPRESSION: 1. No acute osseous abnormality 2. Degenerative changes 3. Chondrocalcinosis Electronically Signed   By: Donavan Foil M.D.   On: 12/17/2017 03:12    Procedures Procedures (including critical care time)  Medications Ordered in ED Medications  acetaminophen (TYLENOL) tablet 650 mg (650 mg Oral Given 12/17/17 0202)     Initial Impression / Assessment and Plan / ED Course  I have reviewed the triage vital signs and the nursing notes.  Pertinent labs & imaging results that were available during my care of the patient were reviewed by me and considered in my medical decision making (see chart for details).     Patient was given Tylenol.  CT of the head  and x-ray of the  right knee was ordered.  We discussed her hallucinations are most likely from steroids especially with the inability to sleep this week.  4 AM I talked to the patient about her test results.  She states she would prefer to stay in the hospital until her hallucinations are resolved.  Admission testing was ordered.  5:57 AM Dr. Myna Hidalgo, hospitalist, will admit  Final Clinical Impressions(s) / ED Diagnoses   Final diagnoses:  Steroid-induced psychosis, with hallucinations ([place])  Fall in home, initial encounter  Contusion of scalp, initial encounter  Contusion of right knee, initial encounter    Plan admission  Rolland Porter, MD, Barbette Or, MD 12/17/17 [phone]

## 2017-12-17 NOTE — Care Management Obs Status (Signed)
[facility] NOTIFICATION   Patient Details  Name: [name] MRN: [id number] Date of Birth: 1941-04-05   Medicare Observation Status Notification Given:  Yes    Sherald Barge, RN 12/17/2017, 2:50 PM

## 2017-12-17 NOTE — Progress Notes (Signed)
Patient admitted to the hospital earlier this morning by Dr. Myna Hidalgo  Patient seen and examined.  Reports that she was having hallucinations, visual and auditory.  She is coherent at this time and unable to provide a significant history.  Exam does not show any focal neurologic deficits.  Patient was admitted to the hospital with fall, hallucinations and confusion.  She was recently started on steroids and Levaquin for upper respiratory tract infection.  This may be contributing to her symptoms.  Head CT was unrevealing.  She was hypertensive on arrival which could also be contributing.  She has multiple medical problems.  Ammonia and B12 are normal range.  TSH is normal.  RPR in progress.  Drug screen positive for benzodiazepines.  I suspect that some of her symptoms may be related to her underlying mental health issues.  She does describe significant anxiety and depression since the loss of her brother.  May need psychiatry input.  [employer]

## 2017-12-18 DIAGNOSIS — B20 Human immunodeficiency virus [HIV] disease: Secondary | ICD-10-CM | POA: Diagnosis not present

## 2017-12-18 DIAGNOSIS — N183 Chronic kidney disease, stage 3 (moderate): Secondary | ICD-10-CM | POA: Diagnosis not present

## 2017-12-18 DIAGNOSIS — S0003XA Contusion of scalp, initial encounter: Secondary | ICD-10-CM | POA: Diagnosis not present

## 2017-12-18 DIAGNOSIS — E039 Hypothyroidism, unspecified: Secondary | ICD-10-CM | POA: Diagnosis not present

## 2017-12-18 DIAGNOSIS — G9340 Encephalopathy, unspecified: Secondary | ICD-10-CM | POA: Diagnosis not present

## 2017-12-18 DIAGNOSIS — I10 Essential (primary) hypertension: Secondary | ICD-10-CM | POA: Diagnosis not present

## 2017-12-18 DIAGNOSIS — J449 Chronic obstructive pulmonary disease, unspecified: Secondary | ICD-10-CM | POA: Diagnosis not present

## 2017-12-18 LAB — BASIC METABOLIC PANEL
Anion gap: 10 (ref 5–15)
BUN: 35 mg/dL — AB (ref 6–20)
CALCIUM: 9.3 mg/dL (ref 8.9–10.3)
CO2: 24 mmol/L (ref 22–32)
CREATININE: 1.78 mg/dL — AB (ref 0.44–1.00)
Chloride: 105 mmol/L (ref 101–111)
GFR calc Af Amer: 31 mL/min — ABNORMAL LOW (ref >60)
GFR calc non Af Amer: 27 mL/min — ABNORMAL LOW (ref >60)
Glucose, Bld: 82 mg/dL (ref 65–99)
Potassium: 4.1 mmol/L (ref 3.5–5.1)
SODIUM: 139 mmol/L (ref 135–145)

## 2017-12-18 LAB — RPR: RPR: NONREACTIVE

## 2017-12-18 LAB — CBC
HCT: 45.8 % (ref 36.0–46.0)
Hemoglobin: 14.8 g/dL (ref 12.0–15.0)
MCH: 33.7 pg (ref 26.0–34.0)
MCHC: 32.3 g/dL (ref 30.0–36.0)
MCV: 104.3 fL — AB (ref 78.0–100.0)
PLATELETS: 163 10*3/uL (ref 150–400)
RBC: 4.39 MIL/uL (ref 3.87–5.11)
RDW: 13.3 % (ref 11.5–15.5)
WBC: 6.3 10*3/uL (ref 4.0–10.5)

## 2017-12-18 NOTE — Progress Notes (Signed)
PROGRESS NOTE    [name]  NGE:[id number] DOB: [birth year] DOA: 12/16/2017 PCP: Octavio Graves, DO    Brief Narrative:  77 year old female with a history of chronic kidney disease stage III, COPD, HIV, admitted to the hospital after being confused, having visual and auditory hallucinations and suffering a fall.  She was recently treated for upper respiratory tract infection with steroids and an antibiotic.  She also describes having significant anxiety and depression recently since the loss of her brother a few months ago.  Workup in the hospital has been unrevealing.  Suspect that she may have a psychiatric component to her symptoms.  Psychiatry has been consulted.   Assessment & Plan:   Principal Problem:   Acute encephalopathy Active Problems:   Human immunodeficiency virus (HIV) disease (HCC)   HTN (hypertension)   CKD (chronic kidney disease) stage 3, GFR 30-59 ml/min (HCC)   COPD (chronic obstructive pulmonary disease) (HCC)   Hypothyroidism   1. Acute encephalopathy.  Patient presented from home after having a fall.  Reports having visual/auditory hallucinations with intermittent confusion.  Head CT was negative for acute findings.  She does not have any focal neurologic deficits on exam.  She has started prednisone and an antibiotic prior to admission for what appears to be an upper respiratory tract infection.  This may be contributing to her symptoms.  She also describes being very depressed and anxious since the death of her brother several months ago.  She does appear to have a psychiatry history since she has noted allergies to Haldol and thiothixene.  She says the only thing she is been treated for in the past is that he does not have any other psychiatric diagnosis.  I suspect that her hallucinations may have a psychiatric component.  CT head, urinalysis, ammonia, TSH, RPR, B12 are unremarkable.  No obvious source of infection.  We will consult psychiatry for further  recommendations 2. COPD.  No cough or wheezing at this time.  Continue nebulizer as needed 3. HIV.  Last CD4 count in 07/2017 was 670.  Viral load was undetectable.  Continue medications. 4. Chronic kidney disease stage III.  Creatinine is near baseline.  Continue gentle hydration.   5. Hypertension. On nebivolol.  Blood pressures are stable. 6. Hypothyroidism.  TSH stable continue on Synthroid 7. Fall.  Patient had a fall prior to admission we will consult physical therapy.   DVT prophylaxis: Heparin Code Status: Full code Family Communication: No family present Disposition Plan: Pending psychiatry evaluation.  She is medically cleared   Consultants:   Psychiatry  Procedures:     Antimicrobials:      Subjective: She says that she is still having visual she did sleep better last night and feels less anxious today.  Objective: Vitals:   12/17/17 1116 12/17/17 2103 12/17/17 2134 12/18/17 0608  BP: (!) 179/65  (!) 145/81 135/85  Pulse: 62  (!) 58 60  Resp: 16     Temp: 97.9 F (36.6 C)  98.7 F (37.1 C) (!) 97.5 F (36.4 C)  TempSrc: Oral  Oral Oral  SpO2: 97% 93% 95% 97%  Weight: 83.1 kg (183 lb 3.2 oz)     Height: 5\' 4"  (1.626 m)       Intake/Output Summary (Last 24 hours) at 12/18/2017 1226 Last data filed at 12/18/2017 4010 Gross per 24 hour  Intake 360 ml  Output 550 ml  Net -190 ml   Filed Weights   12/17/17 0000 12/17/17 1116  Weight:  85.3 kg (188 lb) 83.1 kg (183 lb 3.2 oz)    Examination:  General exam: Appears calm and comfortable  Respiratory system: Occasional rhonchi. Respiratory effort normal. Cardiovascular system: S1 & S2 heard, RRR. No JVD, murmurs, rubs, gallops or clicks. No pedal edema. Gastrointestinal system: Abdomen is nondistended, soft and nontender. No organomegaly or masses felt. Normal bowel sounds heard. Central nervous system: Alert and oriented. No focal neurological deficits. Extremities: Symmetric 5 x 5 power. Skin: No  rashes, lesions or ulcers Psychiatry: Patient appears more calm today.  She still having hallucinations.  Speech is not as pressured today    Data Reviewed: I have personally reviewed following labs and imaging studies  CBC: Recent Labs  Lab 12/17/17 0503 12/18/17 0651  WBC 8.6 6.3  NEUTROABS 5.0  --   HGB 15.3* 14.8  HCT 45.6 45.8  MCV 101.8* 104.3*  PLT 181 300   Basic Metabolic Panel: Recent Labs  Lab 12/17/17 0503 12/18/17 0651  NA 140 139  K 3.7 4.1  CL 105 105  CO2 24 24  GLUCOSE 108* 82  BUN 38* 35*  CREATININE 1.81* 1.78*  CALCIUM 10.3 9.3   GFR: Estimated Creatinine Clearance: 28.1 mL/min (A) (by C-G formula based on SCr of 1.78 mg/dL (H)). Liver Function Tests: Recent Labs  Lab 12/17/17 0503  AST 19  ALT 20  ALKPHOS 81  BILITOT 0.6  PROT 7.1  ALBUMIN 4.0   No results for input(s): LIPASE, AMYLASE in the last 168 hours. Recent Labs  Lab 12/17/17 0631  AMMONIA 13   Coagulation Profile: No results for input(s): INR, PROTIME in the last 168 hours. Cardiac Enzymes: No results for input(s): CKTOTAL, CKMB, CKMBINDEX, TROPONINI in the last 168 hours. BNP (last 3 results) No results for input(s): PROBNP in the last 8760 hours. HbA1C: No results for input(s): HGBA1C in the last 72 hours. CBG: No results for input(s): GLUCAP in the last 168 hours. Lipid Profile: No results for input(s): CHOL, HDL, LDLCALC, TRIG, CHOLHDL, LDLDIRECT in the last 72 hours. Thyroid Function Tests: Recent Labs    12/17/17 0631  TSH 4.497   Anemia Panel: Recent Labs    12/17/17 0631  VITAMINB12 727   Sepsis Labs: No results for input(s): PROCALCITON, LATICACIDVEN in the last 168 hours.  No results found for this or any previous visit (from the past 240 hour(s)).       Radiology Studies: Ct Head Wo Contrast  Result Date: 12/17/2017 CLINICAL DATA:  77 year old female with head trauma. EXAM: CT HEAD WITHOUT CONTRAST TECHNIQUE: Contiguous axial images were  obtained from the base of the skull through the vertex without intravenous contrast. COMPARISON:  Head CT dated 05/31/2014 and MRI dated 08/21/2015 FINDINGS: Brain: There is moderate age-related atrophy and chronic microvascular ischemic changes. Old right basal ganglia infarct and encephalomalacia as seen on the prior MRI. There is no acute intracranial hemorrhage. No mass effect or midline shift. No extra-axial fluid collection. Vascular: No hyperdense vessel or unexpected calcification. Skull: Normal. Negative for fracture or focal lesion. Sinuses/Orbits: There is mild diffuse mucoperiosteal thickening of paranasal sinuses with partial opacification of multiple ethmoid air cells. No air-fluid levels. Remote right lamina papyracea fracture. The mastoid air cells are clear. Other: None IMPRESSION: 1. No acute intracranial hemorrhage. 2. Moderate age-related atrophy and chronic microvascular ischemic changes. Old right basal ganglia infarct and encephalomalacia. 3. Old right lamina Propecia fractures and mild paranasal sinus disease. Electronically Signed   By: Anner Crete M.D.   On:  12/17/2017 03:04   Dg Knee Complete 4 Views Right  Result Date: 12/17/2017 CLINICAL DATA:  [place] home EXAM: RIGHT KNEE - COMPLETE 4+ VIEW COMPARISON:  None. FINDINGS: No acute displaced fracture or malalignment. Mild to moderate degenerative changes of the medial and patellofemoral compartments. No large effusion. Lateral joint space calcification IMPRESSION: 1. No acute osseous abnormality 2. Degenerative changes 3. Chondrocalcinosis Electronically Signed   By: Donavan Foil M.D.   On: 12/17/2017 03:12        Scheduled Meds: . abacavir-dolutegravir-lamiVUDine  1 tablet Oral Daily  . amitriptyline  50 mg Oral QHS  . cholecalciferol  1,000 Units Oral Daily  . feeding supplement (ENSURE ENLIVE)  237 mL Oral BID BM  . heparin  5,000 Units Subcutaneous Q8H  . hydrALAZINE  25 mg Oral Q8H  . levothyroxine  25 mcg Oral  QAC breakfast  . nebivolol  10 mg Oral Daily  . sodium chloride flush  3 mL Intravenous Q12H  . thiamine  100 mg Oral Daily   Continuous Infusions: . sodium chloride    . sodium chloride 100 mL/hr at 12/18/17 0633     LOS: 0 days    Time spent: 25 minutes    Kathie Dike, MD [facility] Pager [phone]  If 7PM-7AM, please contact night-coverage [email or web address] Password [facility] 12/18/2017, 12:26 PM
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
## 2017-12-18 NOTE — Progress Notes (Signed)
Initial Nutrition Assessment  DOCUMENTATION CODES:  Obesity unspecified  INTERVENTION:  Continue ENsure BID  Monitor PO intake and follow up as needed  NUTRITION DIAGNOSIS:  Inadequate oral intake related to poor appetite, social / environmental circumstances(Suspected grief from loss of family member) as evidenced by loss of 15 lbs since last June  GOAL:  Patient will meet greater than or equal to 90% of their needs  MONITOR:  PO intake, Supplement acceptance, Labs, Weight trends  REASON FOR ASSESSMENT:  Malnutrition Screening Tool    ASSESSMENT:  77 y/o female PMHx CKDIII, COPD, HIV, anxiety/depression, HTN. Presented to ED w/ falls, hallucinations and confusion since placed on prednisone and abx on 2/18 for a cough she had for 2-3 weeks prior to that time. Admitted for observation.   RD operating remotely. Patient reported on MST that she had unintentional weight loss and poor intake related to a decreased appetite.   Per chart, the patient has been cleared medically, but a TTS consult has been placed due to MD feeling patient may have underlying mental health issues. Apparently, patient w/ significant anxiety/depression since loss of brother several months ago.   Per chart, patient has lost 4 lbs since she was seen at an urgent care in mid October and 15 lbs since this Past June, at which time she weighed 198.3 lbs . Her weight loss roughly correlates with reported time frame of brothers death.   Other than the MST, there is no mention of poor intake or weight loss in chart.   There is no documented PO intake at this time. Suspect the patient may have had a decrease in PO intake r/t loss of family member leading to wt loss.   Continue Supplementing PO intake w/ Ensure   Labs: BUN/Creat:35/1.78 Meds: Ensure, Vit D, HAART, Thiamine, IVF  Recent Labs  Lab 12/17/17 0503 12/18/17 0651  NA 140 139  K 3.7 4.1  CL 105 105  CO2 24 24  BUN 38* 35*  CREATININE 1.81* 1.78*   CALCIUM 10.3 9.3  GLUCOSE 108* 82   NUTRITION - FOCUSED PHYSICAL EXAM: Unable to conduct  Diet Order:  Diet regular Room service appropriate? Yes; Fluid consistency: Thin  EDUCATION NEEDS:  No education needs have been identified at this time  Skin:  Skin Assessment: Reviewed RN Assessment  Last BM:  2/20  Height:  Ht Readings from Last 1 Encounters:  12/17/17 5\' 4"  (1.626 m)   Weight:  Wt Readings from Last 1 Encounters:  12/17/17 183 lb 3.2 oz (83.1 kg)   Wt Readings from Last 10 Encounters:  12/17/17 183 lb 3.2 oz (83.1 kg)  04/02/17 198 lb (89.8 kg)  04/01/17 198 lb (89.8 kg)  03/30/17 198 lb 4.8 oz (89.9 kg)  08/06/16 219 lb (99.3 kg)  07/22/16 219 lb (99.3 kg)  05/04/16 210 lb (95.3 kg)  10/31/14 214 lb (97.1 kg)  08/06/14 218 lb (98.9 kg)  05/31/14 217 lb 8 oz (98.7 kg)   Ideal Body Weight:  54.54 kg  BMI:  Body mass index is 31.45 kg/m.  Estimated Nutritional Needs:  Kcal:  1600-1750 (19-21 kcal/kg bw) Protein:  70-82g Pro (1.3-1.5 g/kg ibw) Fluid:  1.6-1.8 L fluid ( 65ml/kcal)  Burtis Junes RD, LDN, CNSC Clinical Nutrition Pager: [phone] 12/18/2017 3:23 PM

## 2017-12-18 NOTE — BHH Counselor (Signed)
[facility] provider information to [facility] unit to be given to pt.

## 2017-12-19 DIAGNOSIS — G9340 Encephalopathy, unspecified: Secondary | ICD-10-CM | POA: Diagnosis not present

## 2017-12-19 DIAGNOSIS — S0003XA Contusion of scalp, initial encounter: Secondary | ICD-10-CM | POA: Diagnosis not present

## 2017-12-19 DIAGNOSIS — B20 Human immunodeficiency virus [HIV] disease: Secondary | ICD-10-CM | POA: Diagnosis not present

## 2017-12-19 DIAGNOSIS — N183 Chronic kidney disease, stage 3 (moderate): Secondary | ICD-10-CM | POA: Diagnosis not present

## 2017-12-19 DIAGNOSIS — J449 Chronic obstructive pulmonary disease, unspecified: Secondary | ICD-10-CM | POA: Diagnosis not present

## 2017-12-19 DIAGNOSIS — I10 Essential (primary) hypertension: Secondary | ICD-10-CM | POA: Diagnosis not present

## 2017-12-19 DIAGNOSIS — E039 Hypothyroidism, unspecified: Secondary | ICD-10-CM | POA: Diagnosis not present

## 2017-12-19 MED ORDER — IPRATROPIUM-ALBUTEROL 0.5-2.5 (3) MG/3ML IN SOLN
3.0000 mL | Freq: Three times a day (TID) | RESPIRATORY_TRACT | Status: DC
  Administered 2017-12-19 (×2): 3 mL via RESPIRATORY_TRACT
  Filled 2017-12-19 (×2): qty 3

## 2017-12-19 MED ORDER — HYDROXYZINE HCL 25 MG PO TABS: 25.0000 mg | ORAL_TABLET | Freq: Three times a day (TID) | ORAL | 0 refills | Status: DC | PRN

## 2017-12-19 MED ORDER — ALBUTEROL SULFATE (2.5 MG/3ML) 0.083% IN NEBU: 2.5000 mg | INHALATION_SOLUTION | RESPIRATORY_TRACT | Status: DC | PRN

## 2017-12-19 MED ORDER — HYDRALAZINE HCL 25 MG PO TABS: 25.0000 mg | ORAL_TABLET | Freq: Three times a day (TID) | ORAL | 0 refills | Status: DC
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
## 2017-12-19 NOTE — Progress Notes (Addendum)
Per Zerita Boers NP, pt is psychiatrically stable for discharge. Meagan RN notified.  Maxie Better, MSW, LCSW Clinical Social Worker 12/19/2017 9:52 AM

## 2017-12-19 NOTE — Progress Notes (Signed)
Pt ambulated approx. 200 feet in hallway. Tolerated fair. States she uses a walker at home, used one today. Vitals stable. MD notified.

## 2017-12-19 NOTE — Discharge Summary (Signed)
Physician Discharge Summary  [name] NID:[id number] DOB: [birth year] DOA: 12/16/2017  PCP: Octavio Graves, DO  Admit date: 12/16/2017 Discharge date: 12/19/2017  Admitted From: Home Disposition: Home  Recommendations for Outpatient Follow-up:  1. Follow up with PCP in 1-2 weeks 2. Please obtain BMP/CBC in one week 3. Patient will need outpatient referral to psychiatry  Home Health: Equipment/Devices:  Discharge Condition: Stable CODE STATUS: Full code Diet recommendation: Heart Healthy   Brief/Interim Summary: 77 year old female with a history of chronic kidney disease stage III, COPD, HIV, presents to the hospital after being found confused and having visual and auditory hallucinations.  She also suffered a fall.  She was recently treated for an upper respiratory tract infection with steroids and levofloxacin.  She also describes having significant anxiety and depression recently related to the loss of her brother a few months ago.  It was felt that her change in mental status/delirium was related to recent steroids/fluoroquinolones.  Workup in the hospital was found to be unrevealing.  CT scan of the head did not show any acute findings.  Urinalysis did not show any signs of infection.  Other metabolic causes were also unrevealing.  Patient was seen by psychiatry who did not recommend any specific treatment other than observation.  After being observed in the hospital, patient did show significant improvement.  Her hallucinations have resolved.  Steroids and fluoroquinolones have been discontinued.  Respiratory status appears to be at baseline.  The patient did ambulate with a walker and appeared to be near baseline.  She was noted to be hypertensive despite taking beta-blockers and was started on hydralazine.  This will need further follow-up as an outpatient.  Patient is otherwise stable for discharge.  Discharge Diagnoses:  Principal Problem:   Acute encephalopathy Active  Problems:   Human immunodeficiency virus (HIV) disease (HCC)   HTN (hypertension)   CKD (chronic kidney disease) stage 3, GFR 30-59 ml/min (HCC)   COPD (chronic obstructive pulmonary disease) (HCC)   Hypothyroidism    Discharge Instructions  Discharge Instructions    Diet - low sodium heart healthy   Complete by:  As directed    Increase activity slowly   Complete by:  As directed      Allergies as of 12/19/2017      Reactions   Bee Venom Anaphylaxis   Tenofovir Disoproxil Fumarate [tenofovir Disoproxil Fumarate] Other (See Comments)   Renal failure, (08/27/2006)   Codeine Itching, Nausea And Vomiting   Haloperidol Lactate Other (See Comments)   Reaction is muscle tension, causes severe spasms in face and neck. Forces eyes to roll in the back of the head   Morphine Itching, Swelling   Navane [thiothixene] Other (See Comments), Nausea And Vomiting   Same muscle spasm reaction as haldol   Prednisone    Brief mild psychosis and agitation   Prochlorperazine Nausea And Vomiting   Promethazine Hcl Other (See Comments)   Same as haldol    Propoxyphene N-acetaminophen Itching, Nausea And Vomiting      Medication List    STOP taking these medications   azithromycin 250 MG tablet Commonly known as:  ZITHROMAX Z-PAK   levofloxacin 750 MG tablet Commonly known as:  LEVAQUIN   predniSONE 10 MG tablet Commonly known as:  DELTASONE     TAKE these medications   albuterol 108 (90 Base) MCG/ACT inhaler Commonly known as:  PROVENTIL HFA;VENTOLIN HFA Inhale 1-2 puffs into the lungs every 6 (six) hours as needed for wheezing or shortness of  breath.   amitriptyline 50 MG tablet Commonly known as:  ELAVIL Take 50 mg by mouth at bedtime.   BENADRYL 25 mg capsule Generic drug:  diphenhydrAMINE Take 25 mg by mouth daily as needed for itching (itching from hydrocodone).   co-enzyme Q-10 30 MG capsule Take 30 mg by mouth daily.   furosemide 40 MG tablet Commonly known as:   LASIX Take 40 mg by mouth daily as needed for fluid.   guaiFENesin 600 MG 12 hr tablet Commonly known as:  MUCINEX Take 1 tablet (600 mg total) by mouth 2 (two) times daily.   hydrALAZINE 25 MG tablet Commonly known as:  APRESOLINE Take 1 tablet (25 mg total) by mouth every 8 (eight) hours.   hydrOXYzine 25 MG tablet Commonly known as:  ATARAX/VISTARIL Take 1 tablet (25 mg total) by mouth 3 (three) times daily as needed.   ipratropium-albuterol 0.5-2.5 (3) MG/3ML Soln Commonly known as:  DUONEB Inhale 3 mLs into the lungs every 6 (six) hours as needed.   levothyroxine 25 MCG tablet Commonly known as:  SYNTHROID, LEVOTHROID Take 25 mcg by mouth daily.   nebivolol 10 MG tablet Commonly known as:  BYSTOLIC Take 10 mg by mouth daily.   ondansetron 8 MG tablet Commonly known as:  ZOFRAN Take 8 mg by mouth daily as needed for nausea or vomiting.   Oxycodone HCl 10 MG Tabs Take 10 mg by mouth 4 (four) times daily as needed. For pain   polyvinyl alcohol 1.4 % ophthalmic solution Commonly known as:  LIQUIFILM TEARS Place 1 drop into both eyes as needed for dry eyes.   thiamine 100 MG tablet Commonly known as:  VITAMIN B-1 Take 100 mg by mouth daily.   TRIUMEQ 027-25-366 MG tablet Generic drug:  abacavir-dolutegravir-lamiVUDine TAKE (1) TABLET BY MOUTH ONCE DAILY.   Vitamin D3 10000 units Tabs Take 1 tablet by mouth daily.       Allergies  Allergen Reactions  . Bee Venom Anaphylaxis  . Tenofovir Disoproxil Fumarate [Tenofovir Disoproxil Fumarate] Other (See Comments)    Renal failure, (08/27/2006)  . Codeine Itching and Nausea And Vomiting  . Haloperidol Lactate Other (See Comments)    Reaction is muscle tension, causes severe spasms in face and neck. Forces eyes to roll in the back of the head  . Morphine Itching and Swelling  . Navane [Thiothixene] Other (See Comments) and Nausea And Vomiting    Same muscle spasm reaction as haldol  . Prednisone     Brief mild  psychosis and agitation  . Prochlorperazine Nausea And Vomiting  . Promethazine Hcl Other (See Comments)    Same as haldol   . Propoxyphene N-Acetaminophen Itching and Nausea And Vomiting    Consultations:  Psychiatry   Procedures/Studies: Ct Head Wo Contrast  Result Date: 12/17/2017 CLINICAL DATA:  77 year old female with head trauma. EXAM: CT HEAD WITHOUT CONTRAST TECHNIQUE: Contiguous axial images were obtained from the base of the skull through the vertex without intravenous contrast. COMPARISON:  Head CT dated 05/31/2014 and MRI dated 08/21/2015 FINDINGS: Brain: There is moderate age-related atrophy and chronic microvascular ischemic changes. Old right basal ganglia infarct and encephalomalacia as seen on the prior MRI. There is no acute intracranial hemorrhage. No mass effect or midline shift. No extra-axial fluid collection. Vascular: No hyperdense vessel or unexpected calcification. Skull: Normal. Negative for fracture or focal lesion. Sinuses/Orbits: There is mild diffuse mucoperiosteal thickening of paranasal sinuses with partial opacification of multiple ethmoid air cells. No air-fluid levels. Remote  right lamina papyracea fracture. The mastoid air cells are clear. Other: None IMPRESSION: 1. No acute intracranial hemorrhage. 2. Moderate age-related atrophy and chronic microvascular ischemic changes. Old right basal ganglia infarct and encephalomalacia. 3. Old right lamina Propecia fractures and mild paranasal sinus disease. Electronically Signed   By: Anner Crete M.D.   On: 12/17/2017 03:04   Dg Knee Complete 4 Views Right  Result Date: 12/17/2017 CLINICAL DATA:  [place] at home EXAM: RIGHT KNEE - COMPLETE 4+ VIEW COMPARISON:  None. FINDINGS: No acute displaced fracture or malalignment. Mild to moderate degenerative changes of the medial and patellofemoral compartments. No large effusion. Lateral joint space calcification IMPRESSION: 1. No acute osseous abnormality 2. Degenerative  changes 3. Chondrocalcinosis Electronically Signed   By: Donavan Foil M.D.   On: 12/17/2017 03:12       Subjective: Feeling better today.  Hallucinations have resolved.  Discharge Exam: Vitals:   12/19/17 1410 12/19/17 1428  BP:  (!) 147/74  Pulse:  76  Resp:  20  Temp:  98.8 F (37.1 C)  SpO2: (!) 88% 99%   Vitals:   12/19/17 0658 12/19/17 0906 12/19/17 1410 12/19/17 1428  BP: (!) 156/64   (!) 147/74  Pulse:    76  Resp:    20  Temp:    98.8 F (37.1 C)  TempSrc:    Oral  SpO2:  93% (!) 88% 99%  Weight:      Height:        General: Pt is alert, awake, not in acute distress Cardiovascular: RRR, S1/S2 +, no rubs, no gallops Respiratory: CTA bilaterally, no wheezing, no rhonchi Abdominal: Soft, NT, ND, bowel sounds + Extremities: no edema, no cyanosis    The results of significant diagnostics from this hospitalization (including imaging, microbiology, ancillary and laboratory) are listed below for reference.     Microbiology: No results found for this or any previous visit (from the past 240 hour(s)).   Labs: BNP (last 3 results) Recent Labs    03/29/17 1704  BNP 431.5*   Basic Metabolic Panel: Recent Labs  Lab 12/17/17 0503 12/18/17 0651  NA 140 139  K 3.7 4.1  CL 105 105  CO2 24 24  GLUCOSE 108* 82  BUN 38* 35*  CREATININE 1.81* 1.78*  CALCIUM 10.3 9.3   Liver Function Tests: Recent Labs  Lab 12/17/17 0503  AST 19  ALT 20  ALKPHOS 81  BILITOT 0.6  PROT 7.1  ALBUMIN 4.0   No results for input(s): LIPASE, AMYLASE in the last 168 hours. Recent Labs  Lab 12/17/17 0631  AMMONIA 13   CBC: Recent Labs  Lab 12/17/17 0503 12/18/17 0651  WBC 8.6 6.3  NEUTROABS 5.0  --   HGB 15.3* 14.8  HCT 45.6 45.8  MCV 101.8* 104.3*  PLT 181 163   Cardiac Enzymes: No results for input(s): CKTOTAL, CKMB, CKMBINDEX, TROPONINI in the last 168 hours. BNP: Invalid input(s): POCBNP CBG: No results for input(s): GLUCAP in the last 168  hours. D-Dimer No results for input(s): DDIMER in the last 72 hours. Hgb A1c No results for input(s): HGBA1C in the last 72 hours. Lipid Profile No results for input(s): CHOL, HDL, LDLCALC, TRIG, CHOLHDL, LDLDIRECT in the last 72 hours. Thyroid function studies Recent Labs    12/17/17 0631  TSH 4.497   Anemia work up Recent Labs    12/17/17 0631  VITAMINB12 727   Urinalysis    Component Value Date/Time   COLORURINE YELLOW 12/17/2017 0453  APPEARANCEUR CLEAR 12/17/2017 0453   LABSPEC 1.010 12/17/2017 0453   PHURINE 6.0 12/17/2017 0453   GLUCOSEU NEGATIVE 12/17/2017 0453   GLUCOSEU NEG mg/dL 09/20/2007 1114   HGBUR NEGATIVE 12/17/2017 0453   HGBUR moderate 06/04/2010 1425   BILIRUBINUR NEGATIVE 12/17/2017 0453   KETONESUR NEGATIVE 12/17/2017 0453   PROTEINUR 30 (A) 12/17/2017 0453   UROBILINOGEN 1.0 05/31/2014 0933   NITRITE NEGATIVE 12/17/2017 0453   LEUKOCYTESUR NEGATIVE 12/17/2017 0453   Sepsis Labs Invalid input(s): PROCALCITONIN,  WBC,  LACTICIDVEN Microbiology No results found for this or any previous visit (from the past 240 hour(s)).   Time coordinating discharge: Over 30 minutes  SIGNED:   Kathie Dike, MD  [facility] 12/19/2017, 6:27 PM Pager   If 7PM-7AM, please contact night-coverage [email or web address] Password [email or web address]

## 2017-12-20 LAB — FOLATE RBC
FOLATE, HEMOLYSATE: 502.2 ng/mL
Folate, RBC: 1136 ng/mL (ref >498)
HEMATOCRIT: 44.2 % (ref 34.0–46.6)

## 2017-12-22 DIAGNOSIS — E039 Hypothyroidism, unspecified: Secondary | ICD-10-CM | POA: Diagnosis not present

## 2017-12-22 DIAGNOSIS — F418 Other specified anxiety disorders: Secondary | ICD-10-CM | POA: Diagnosis not present

## 2017-12-29 ENCOUNTER — Ambulatory Visit: Payer: Self-pay | Admitting: Urology

## 2018-02-07 ENCOUNTER — Other Ambulatory Visit: Payer: Self-pay | Admitting: Infectious Disease

## 2018-02-07 DIAGNOSIS — B20 Human immunodeficiency virus [HIV] disease: Secondary | ICD-10-CM

## 2018-02-08 DIAGNOSIS — E782 Mixed hyperlipidemia: Secondary | ICD-10-CM | POA: Diagnosis not present

## 2018-02-08 DIAGNOSIS — M5137 Other intervertebral disc degeneration, lumbosacral region: Secondary | ICD-10-CM | POA: Diagnosis not present

## 2018-02-08 DIAGNOSIS — M1A0710 Idiopathic chronic gout, right ankle and foot, without tophus (tophi): Secondary | ICD-10-CM | POA: Diagnosis not present

## 2018-02-08 DIAGNOSIS — K219 Gastro-esophageal reflux disease without esophagitis: Secondary | ICD-10-CM | POA: Diagnosis not present

## 2018-02-08 DIAGNOSIS — G8929 Other chronic pain: Secondary | ICD-10-CM | POA: Diagnosis not present

## 2018-02-08 DIAGNOSIS — B20 Human immunodeficiency virus [HIV] disease: Secondary | ICD-10-CM | POA: Diagnosis not present

## 2018-02-08 DIAGNOSIS — M1711 Unilateral primary osteoarthritis, right knee: Secondary | ICD-10-CM | POA: Diagnosis not present

## 2018-02-08 DIAGNOSIS — Z6838 Body mass index (BMI) 38.0-38.9, adult: Secondary | ICD-10-CM | POA: Diagnosis not present

## 2018-02-08 DIAGNOSIS — F418 Other specified anxiety disorders: Secondary | ICD-10-CM | POA: Diagnosis not present

## 2018-03-14 ENCOUNTER — Other Ambulatory Visit: Payer: Self-pay | Admitting: Infectious Disease

## 2018-03-14 DIAGNOSIS — B20 Human immunodeficiency virus [HIV] disease: Secondary | ICD-10-CM

## 2018-04-04 ENCOUNTER — Ambulatory Visit (INDEPENDENT_AMBULATORY_CARE_PROVIDER_SITE_OTHER): Payer: Medicare HMO | Admitting: Pharmacist Clinician (PhC)/ Clinical Pharmacy Specialist

## 2018-04-04 DIAGNOSIS — Z91030 Bee allergy status: Secondary | ICD-10-CM

## 2018-04-04 DIAGNOSIS — Z885 Allergy status to narcotic agent status: Secondary | ICD-10-CM | POA: Diagnosis not present

## 2018-04-04 DIAGNOSIS — Z23 Encounter for immunization: Secondary | ICD-10-CM | POA: Diagnosis not present

## 2018-04-04 DIAGNOSIS — Z79899 Other long term (current) drug therapy: Secondary | ICD-10-CM

## 2018-04-04 DIAGNOSIS — Z888 Allergy status to other drugs, medicaments and biological substances status: Secondary | ICD-10-CM | POA: Diagnosis not present

## 2018-04-04 DIAGNOSIS — B20 Human immunodeficiency virus [HIV] disease: Secondary | ICD-10-CM | POA: Diagnosis not present

## 2018-04-04 MED ORDER — ABACAVIR-DOLUTEGRAVIR-LAMIVUD 600-50-300 MG PO TABS: 1.0000 | ORAL_TABLET | Freq: Every day | ORAL | 5 refills | Status: DC

## 2018-04-04 NOTE — Progress Notes (Addendum)
HPI: [name] is a 77 y.o. female who presents to clinic for an HIV follow-up.  Allergies: Allergies  Allergen Reactions  . Bee Venom Anaphylaxis  . Tenofovir Disoproxil Fumarate [Tenofovir Disoproxil Fumarate] Other (See Comments)    Renal failure, (08/27/2006)  . Compazine  [Prochlorperazine Edisylate] Nausea And Vomiting  . Haloperidol Lactate Other (See Comments)    Reaction is muscle tension, causes severe spasms in face and neck. Forces eyes to roll in the back of the head  . Codeine Itching and Nausea And Vomiting  . Navane [Thiothixene] Other (See Comments) and Nausea And Vomiting    Same muscle spasm reaction as haldol  . Prednisone     Brief mild psychosis and agitation  . Prochlorperazine Nausea And Vomiting  . Promethazine Hcl Other (See Comments)    Same as haldol   . Propoxyphene N-Acetaminophen Itching and Nausea And Vomiting  . Morphine Itching and Swelling   Past Medical History: Past Medical History:  Diagnosis Date  . Anxiety   . Arthritis   . Bronchitis   . CKD (chronic kidney disease) stage 3, GFR 30-59 ml/min (HCC) 05/04/2016  . COPD (chronic obstructive pulmonary disease) ([place])   . Depression   . Elevated LFTs 05/04/2016  . History of TIAs 1981   left side weakness  . HIV (human immunodeficiency virus infection) ([place])   . Hypertension   . Kidney disease related to HIV infection [facility])    pt states she has to have her creatinine level checked often   . Nausea 03/03/2017  . Peripheral neuropathy    bilateral feet  . UTI (urinary tract infection) 03/03/2017  . Weight loss 03/03/2017   Social History: Social History   Socioeconomic History  . Marital status: Widowed    Spouse name: Not on file  . Number of children: Not on file  . Years of education: Not on file  . Highest education level: Not on file  Occupational History  . Occupation: retired    Fish farm manager: RETIRED  Social Needs  . Financial resource strain: Not on file  . Food insecurity:    Worry: Not on file    Inability: Not on file  . Transportation needs:    Medical: Not on file    Non-medical: Not on file  Tobacco Use  . Smoking status: Never Smoker  . Smokeless tobacco: Never Used  Substance and Sexual Activity  . Alcohol use: No  . Drug use: No  . Sexual activity: Not Currently    Comment: declined condoms  Lifestyle  . Physical activity:    Days per week: Not on file    Minutes per session: Not on file  . Stress: Not on file  Relationships  . Social connections:    Talks on phone: Not on file    Gets together: Not on file    Attends religious service: Not on file    Active member of club or organization: Not on file    Attends meetings of clubs or organizations: Not on file    Relationship status: Not on file  Other Topics Concern  . Not on file  Social History Narrative  . Not on file   Previous Regimen: Viracept + abacavir + lamivudine>>isentress/abacavir/lamivudine  Current Regimen: Triumeq   Labs: HIV 1 RNA Quant (copies/mL)  Date Value  08/23/2017 <20 NOT DETECTED  01/18/2017 <20 NOT DETECTED  05/04/2016 <20   CD4 T Cell Abs (/uL)  Date Value  08/23/2017 670  01/18/2017 990  05/04/2016 800   Hep B S Ab (no units)  Date Value  05/04/2016 NEG   Hepatitis B Surface Ag (no units)  Date Value  05/04/2016 NEGATIVE   HCV Ab (no units)  Date Value  05/04/2016 NEGATIVE   CrCl: CrCl cannot be calculated (Patient's most recent lab result is older than the maximum 21 days allowed.).  Lipids:    Component Value Date/Time   CHOL 225 (H) 08/23/2017 1002   TRIG 352 (H) 08/23/2017 1002   HDL 25 (L) 08/23/2017 1002   CHOLHDL 9.0 (H) 08/23/2017 1002   VLDL NOT CALC 11/13/2015 1108   LDLCALC 146 (H) 08/23/2017 1002   Assessment: [name] presents to clinic today for an HIV follow-up. She feels well and is tolerating her Triumeq very well. She states that she takes her Triumeq daily but only has 2 pills left. She has a history of  noncompliance and no shows to appointments. After completing her medication history she is high risk for noncompliance as she does not have a good knowledge of what medications she's on and their indications. Compliance was again encouraged. She states she is very good about taking her Triumeq. Great deal of time spent educating her on the HIV virus itself, viral loads, and medication mechanisms.  She needs her Hep A and Hep B vaccinations today as her titers do not show protection   Recommendations: F/u with Dr. [name] next month, also for 2nd Hep A/B vaccine  Continue Triumeq one pill once daily, to be delivered  Hep A/Hep B 1st vaccine given today  HIV VL today   Bridgett Larsson, PharmD, [place] [facility] 04/04/2018, 2:01 PM   Agreed with Frady Taddeo's note. She is very good with her med adherence especially her Triumeq. Send more refills to [facility] in [place]. Scheduled her for a f/u with Dr. [name] in July. She will likely need to be o statins due to her TG. Advised her to talk to her primary about it.   Onnie Boer, PharmD, BCPS, AAHIVP, CPP Infectious Disease Pharmacist Pager: [phone] 04/04/2018 3:14 PM

## 2018-04-06 LAB — HIV-1 RNA QUANT-NO REFLEX-BLD
HIV 1 RNA QUANT: NOT DETECTED {copies}/mL
HIV-1 RNA Quant, Log: <1.3 Log copies/mL

## 2018-05-06 ENCOUNTER — Encounter: Payer: Self-pay | Admitting: Infectious Disease

## 2018-05-06 ENCOUNTER — Ambulatory Visit (INDEPENDENT_AMBULATORY_CARE_PROVIDER_SITE_OTHER): Payer: Medicare HMO | Admitting: Infectious Disease

## 2018-05-06 VITALS — BP 162/84 | HR 53 | Temp 98.4°F | Wt 196.0 lb

## 2018-05-06 DIAGNOSIS — J0110 Acute frontal sinusitis, unspecified: Secondary | ICD-10-CM | POA: Diagnosis not present

## 2018-05-06 DIAGNOSIS — G909 Disorder of the autonomic nervous system, unspecified: Secondary | ICD-10-CM | POA: Diagnosis not present

## 2018-05-06 DIAGNOSIS — J309 Allergic rhinitis, unspecified: Secondary | ICD-10-CM | POA: Diagnosis not present

## 2018-05-06 DIAGNOSIS — B20 Human immunodeficiency virus [HIV] disease: Secondary | ICD-10-CM | POA: Diagnosis not present

## 2018-05-06 DIAGNOSIS — N183 Chronic kidney disease, stage 3 unspecified: Secondary | ICD-10-CM

## 2018-05-06 DIAGNOSIS — Z23 Encounter for immunization: Secondary | ICD-10-CM

## 2018-05-06 DIAGNOSIS — J329 Chronic sinusitis, unspecified: Secondary | ICD-10-CM

## 2018-05-06 HISTORY — DX: Chronic sinusitis, unspecified: J32.9

## 2018-05-06 MED ORDER — FLUTICASONE PROPIONATE 50 MCG/ACT NA SUSP: 2.0000 | Freq: Every day | NASAL | 8 refills | Status: DC

## 2018-05-06 NOTE — Patient Instructions (Signed)
Please buy OTV Afrin (just buy the generic store version of this it is usually right next to brand name afrin  SPRAY TWO SPRAYS OF AFRIN into each nostril at bedtime  AFTER your nose is opened up THEN spray   TWO SPRAYS OF FLONASE into each nostril  Do both of these for next 7-14 days and then continue the [facility] by itself  If you do not feel better in the next week call me and I can call in an antibiotic  Please ask the pharmacist about a decongestant to take as well that will not elevated your blood pressure  Also I want you to start taking zyrtec every day

## 2018-05-06 NOTE — Progress Notes (Signed)
Subjective:   Chief complaint sinus congestion and frontal sinus pain  :  Patient ID: [name], female    DOB: Jan 13, 1941, 77 y.o.   MRN: [id number]  HPI  77 year-old Caucasian female with HIV currently well controlled on TRIUMEQ  Her viral load was undetectable and cd4 count healthy.  She has not physically seen me for more than 2 years if I am reading the chart correctly.  Lab Results  Component Value Date   HIV1RNAQUANT <20 NOT DETECTED 04/04/2018   HIV1RNAQUANT <20 NOT DETECTED 08/23/2017   HIV1RNAQUANT <20 NOT DETECTED 01/18/2017    Lab Results  Component Value Date   CD4TABS 670 08/23/2017   CD4TABS 990 01/18/2017   CD4TABS 800 05/04/2016    Complaining of 3 days worth of pain in her frontal sinuses as well as going down into her mouth.  She has not been able to breathe through her nose for several months likely due to allergic rhinitis and sinusitis.   Past Medical History:  Diagnosis Date  . Anxiety   . Arthritis   . Bronchitis   . CKD (chronic kidney disease) stage 3, GFR 30-59 ml/min (HCC) 05/04/2016  . COPD (chronic obstructive pulmonary disease) ([place])   . Depression   . Elevated LFTs 05/04/2016  . History of TIAs 1981   left side weakness  . HIV (human immunodeficiency virus infection) ([place])   . Hypertension   . Kidney disease related to HIV infection [facility])    pt states she has to have her creatinine level checked often   . Nausea 03/03/2017  . Peripheral neuropathy    bilateral feet  . UTI (urinary tract infection) 03/03/2017  . Weight loss 03/03/2017    Past Surgical History:  Procedure Laterality Date  . ABDOMINAL HYSTERECTOMY  1985  . BACK SURGERY    . CATARACT EXTRACTION, BILATERAL    . CHOLECYSTECTOMY    . COLONOSCOPY  08/10/2002   NUR: Normal colonoscopy except for some hemorrhoids and some erythema at the dentate line  . COLONOSCOPY  12/10/2006   Edwards:Diffuse colitis in the transverse and descending colon/This is not entirely typical  of ischemic colitis or her HIV positive/ disease.  We need to be concerned about other causes  . COLONOSCOPY  01/19/2011   RMR: Internal and external hemorrhoids, likely source of hematochezia anal papilla, otherwise normal rectal mucosa/ Left-sided diverticula.  Cecal polyp with status post cold snare polypectomy.  Remainder of colonic mucosa appeared normal  . ESOPHAGOGASTRODUODENOSCOPY  08/10/2002     NUR: Mild changes of reflux esophagitis limited to gastroesophageal junction  No evidence of ring, stricture, or esophageal candidiasis dysphagia/ Gastritis, possibly H. pylori induced  . ESOPHAGOGASTRODUODENOSCOPY  12/25/2002   NUR: Erosive antral gastritis.  The degree of gastritis is more significant than her last exam/ Normal examination of the esophagus/ Esophageal dilatation performed by passing 85 and 31 [place]  . ESOPHAGOGASTRODUODENOSCOPY  11/13/2010   RMR: Circumferential distal esophageal erosions with soft peptic stricture, consistent with erosive reflux esophagitis or stricture  formation, status post dilation as described above/  Small hiatal hernia/ Tiny antral erosions, otherwise normal stomach D1 and D2  . HEMORRHOID SURGERY  09/02/2012   Procedure: HEMORRHOIDECTOMY;  Surgeon: Jamesetta So, MD;  Location: [facility];  Service: General;  Laterality: N/A;  . TUBAL LIGATION      Family History  Problem Relation Age of Onset  . Diabetes Brother       Social History   Socioeconomic History  .  Marital status: Widowed    Spouse name: Not on file  . Number of children: Not on file  . Years of education: Not on file  . Highest education level: Not on file  Occupational History  . Occupation: retired    Fish farm manager: RETIRED  Social Needs  . Financial resource strain: Not on file  . Food insecurity:    Worry: Not on file    Inability: Not on file  . Transportation needs:    Medical: Not on file    Non-medical: Not on file  Tobacco Use  . Smoking status: Never Smoker    . Smokeless tobacco: Never Used  Substance and Sexual Activity  . Alcohol use: No  . Drug use: No  . Sexual activity: Not Currently    Comment: declined condoms  Lifestyle  . Physical activity:    Days per week: Not on file    Minutes per session: Not on file  . Stress: Not on file  Relationships  . Social connections:    Talks on phone: Not on file    Gets together: Not on file    Attends religious service: Not on file    Active member of club or organization: Not on file    Attends meetings of clubs or organizations: Not on file    Relationship status: Not on file  Other Topics Concern  . Not on file  Social History Narrative  . Not on file    Allergies  Allergen Reactions  . Bee Venom Anaphylaxis  . Tenofovir Disoproxil Fumarate [Tenofovir Disoproxil Fumarate] Other (See Comments)    Renal failure, (08/27/2006)  . Compazine  [Prochlorperazine Edisylate] Nausea And Vomiting  . Haloperidol Lactate Other (See Comments)    Reaction is muscle tension, causes severe spasms in face and neck. Forces eyes to roll in the back of the head  . Codeine Itching and Nausea And Vomiting  . Navane [Thiothixene] Other (See Comments) and Nausea And Vomiting    Same muscle spasm reaction as haldol  . Prednisone     Brief mild psychosis and agitation  . Prochlorperazine Nausea And Vomiting  . Promethazine Hcl Other (See Comments)    Same as haldol   . Propoxyphene N-Acetaminophen Itching and Nausea And Vomiting  . Morphine Itching and Swelling     Current Outpatient Medications:  .  abacavir-dolutegravir-lamiVUDine (TRIUMEQ) 600-50-300 MG tablet, Take 1 tablet by mouth daily., Disp: 30 tablet, Rfl: 5 .  albuterol (PROVENTIL HFA;VENTOLIN HFA) 108 (90 Base) MCG/ACT inhaler, Inhale 1-2 puffs into the lungs every 6 (six) hours as needed for wheezing or shortness of breath., Disp: 1 Inhaler, Rfl: 0 .  amitriptyline (ELAVIL) 50 MG tablet, Take 50 mg by mouth at bedtime. , Disp: , Rfl:  .   Cholecalciferol (VITAMIN D3) 10000 units TABS, Take 1 tablet by mouth daily., Disp: , Rfl:  .  diphenhydrAMINE (BENADRYL) 25 mg capsule, Take 25 mg by mouth daily as needed for itching (itching from hydrocodone)., Disp: , Rfl:  .  furosemide (LASIX) 40 MG tablet, Take 40 mg by mouth daily as needed for fluid. , Disp: , Rfl:  .  ipratropium-albuterol (DUONEB) 0.5-2.5 (3) MG/3ML SOLN, Inhale 3 mLs into the lungs every 6 (six) hours as needed., Disp: , Rfl:  .  LORazepam (ATIVAN) 1 MG tablet, Take 1 mg by mouth 2 (two) times daily., Disp: , Rfl:  .  nebivolol (BYSTOLIC) 10 MG tablet, Take 10 mg by mouth daily. , Disp: , Rfl:  .  ondansetron (ZOFRAN) 8 MG tablet, Take 8 mg by mouth daily as needed for nausea or vomiting. , Disp: , Rfl:  .  Oxycodone HCl 10 MG TABS, Take 10 mg by mouth 4 (four) times daily as needed. For pain, Disp: , Rfl:  .  polyvinyl alcohol (LIQUIFILM TEARS) 1.4 % ophthalmic solution, Place 1 drop into both eyes as needed for dry eyes., Disp: , Rfl:  .  thiamine (VITAMIN B-1) 100 MG tablet, Take 100 mg by mouth daily., Disp: , Rfl:  .  co-enzyme Q-10 30 MG capsule, Take 30 mg by mouth daily. , Disp: , Rfl:  .  guaiFENesin (MUCINEX) 600 MG 12 hr tablet, Take 1 tablet (600 mg total) by mouth 2 (two) times daily. (Patient not taking: Reported on 12/17/2017), Disp: 30 tablet, Rfl: 0 .  hydrALAZINE (APRESOLINE) 25 MG tablet, Take 1 tablet (25 mg total) by mouth every 8 (eight) hours. (Patient not taking: Reported on 04/04/2018), Disp: 90 tablet, Rfl: 0 .  hydrOXYzine (ATARAX/VISTARIL) 25 MG tablet, Take 1 tablet (25 mg total) by mouth 3 (three) times daily as needed. (Patient not taking: Reported on 04/04/2018), Disp: 30 tablet, Rfl: 0 .  levothyroxine (SYNTHROID, LEVOTHROID) 25 MCG tablet, Take 25 mcg by mouth daily., Disp: , Rfl:   SReview of Systems  Constitutional: Negative for appetite change and diaphoresis.  HENT: Positive for postnasal drip, rhinorrhea, sinus pressure and sinus  pain. Negative for congestion, sneezing and trouble swallowing.   Respiratory: Negative for chest tightness and stridor.   Cardiovascular: Negative for palpitations and leg swelling.  Gastrointestinal: Positive for abdominal pain. Negative for abdominal distention, anal bleeding, blood in stool, constipation, diarrhea and nausea.  Genitourinary: Negative for difficulty urinating, dysuria, flank pain, hematuria and urgency.  Musculoskeletal: Negative for gait problem and joint swelling.  Skin: Negative for color change, pallor and wound.  Neurological: Negative for tremors and light-headedness.  Hematological: Negative for adenopathy. Does not bruise/bleed easily.  Psychiatric/Behavioral: Negative for agitation, behavioral problems, confusion, decreased concentration, dysphoric mood, self-injury and sleep disturbance.       Objective:   Physical Exam  Constitutional: She is oriented to person, place, and time. She appears well-developed and well-nourished. No distress.  HENT:  Head: Normocephalic and atraumatic.    Nose: Mucosal edema present.  Mouth/Throat: No oropharyngeal exudate.  Eyes: Conjunctivae and EOM are normal. No scleral icterus.  Neck: Normal range of motion. Neck supple.  Cardiovascular: Normal rate and regular rhythm.  Pulmonary/Chest: Effort normal. No respiratory distress. She has no wheezes.  Abdominal: She exhibits no distension. There is tenderness in the suprapubic area. There is no CVA tenderness.  Musculoskeletal: She exhibits no edema or tenderness.  Neurological: She is alert and oriented to person, place, and time. She exhibits normal muscle tone. Coordination normal.  Skin: Skin is warm and dry. No rash noted. She is not diaphoretic. No erythema. No pallor.  Psychiatric: She has a normal mood and affect. Her behavior is normal. Judgment and thought content normal.          Assessment & Plan:   She has evidence of potential viral sinusitis superimposed  on chronic allergic sinusitis: Certainly evolving bacterial superinfection is not out of the question.  I would like her to do Afrin sprays in each nose at night followed by Flonase.  I would like her to take a decongestant and also to take an antihistamine.  At this time improve we can give her a prescription of amoxicillin  IV: Continue TRIUMEQ  and recheck labs  in 6 months time   HIV neuropathy: continue gabapentin  Depression:  continue amitriptyline at bedtime for now  CKD: relatively stable

## 2018-05-10 DIAGNOSIS — E782 Mixed hyperlipidemia: Secondary | ICD-10-CM | POA: Diagnosis not present

## 2018-05-10 DIAGNOSIS — B20 Human immunodeficiency virus [HIV] disease: Secondary | ICD-10-CM | POA: Diagnosis not present

## 2018-05-10 DIAGNOSIS — M1711 Unilateral primary osteoarthritis, right knee: Secondary | ICD-10-CM | POA: Diagnosis not present

## 2018-05-10 DIAGNOSIS — M1A0710 Idiopathic chronic gout, right ankle and foot, without tophus (tophi): Secondary | ICD-10-CM | POA: Diagnosis not present

## 2018-05-10 DIAGNOSIS — M8589 Other specified disorders of bone density and structure, multiple sites: Secondary | ICD-10-CM | POA: Diagnosis not present

## 2018-05-10 DIAGNOSIS — K219 Gastro-esophageal reflux disease without esophagitis: Secondary | ICD-10-CM | POA: Diagnosis not present

## 2018-05-10 DIAGNOSIS — G8929 Other chronic pain: Secondary | ICD-10-CM | POA: Diagnosis not present

## 2018-05-10 DIAGNOSIS — F418 Other specified anxiety disorders: Secondary | ICD-10-CM | POA: Diagnosis not present

## 2018-05-10 DIAGNOSIS — M5137 Other intervertebral disc degeneration, lumbosacral region: Secondary | ICD-10-CM | POA: Diagnosis not present

## 2018-05-31 DIAGNOSIS — M24561 Contracture, right knee: Secondary | ICD-10-CM | POA: Diagnosis not present

## 2018-05-31 DIAGNOSIS — M1711 Unilateral primary osteoarthritis, right knee: Secondary | ICD-10-CM | POA: Diagnosis not present

## 2018-06-09 DIAGNOSIS — R51 Headache: Secondary | ICD-10-CM | POA: Diagnosis not present

## 2018-06-09 DIAGNOSIS — M5137 Other intervertebral disc degeneration, lumbosacral region: Secondary | ICD-10-CM | POA: Diagnosis not present

## 2018-06-09 DIAGNOSIS — K219 Gastro-esophageal reflux disease without esophagitis: Secondary | ICD-10-CM | POA: Diagnosis not present

## 2018-06-09 DIAGNOSIS — N329 Bladder disorder, unspecified: Secondary | ICD-10-CM | POA: Diagnosis not present

## 2018-06-09 DIAGNOSIS — F418 Other specified anxiety disorders: Secondary | ICD-10-CM | POA: Diagnosis not present

## 2018-06-09 DIAGNOSIS — I10 Essential (primary) hypertension: Secondary | ICD-10-CM | POA: Diagnosis not present

## 2018-06-09 DIAGNOSIS — G8929 Other chronic pain: Secondary | ICD-10-CM | POA: Diagnosis not present

## 2018-07-19 DIAGNOSIS — G8929 Other chronic pain: Secondary | ICD-10-CM | POA: Diagnosis not present

## 2018-07-19 DIAGNOSIS — F418 Other specified anxiety disorders: Secondary | ICD-10-CM | POA: Diagnosis not present

## 2018-07-19 DIAGNOSIS — I10 Essential (primary) hypertension: Secondary | ICD-10-CM | POA: Diagnosis not present

## 2018-07-19 DIAGNOSIS — E039 Hypothyroidism, unspecified: Secondary | ICD-10-CM | POA: Diagnosis not present

## 2018-07-19 DIAGNOSIS — E782 Mixed hyperlipidemia: Secondary | ICD-10-CM | POA: Diagnosis not present

## 2018-07-19 DIAGNOSIS — J019 Acute sinusitis, unspecified: Secondary | ICD-10-CM | POA: Diagnosis not present

## 2018-07-19 DIAGNOSIS — K219 Gastro-esophageal reflux disease without esophagitis: Secondary | ICD-10-CM | POA: Diagnosis not present

## 2018-07-19 DIAGNOSIS — R51 Headache: Secondary | ICD-10-CM | POA: Diagnosis not present

## 2018-08-28 ENCOUNTER — Emergency Department (HOSPITAL_COMMUNITY)
Admission: EM | Admit: 2018-08-28 | Discharge: 2018-08-28 | Disposition: A | Discharge status: Home / Self Care | Payer: Medicare HMO | Attending: Emergency Medicine | Admitting: Emergency Medicine

## 2018-08-28 ENCOUNTER — Other Ambulatory Visit: Payer: Self-pay

## 2018-08-28 ENCOUNTER — Encounter (HOSPITAL_COMMUNITY): Payer: Self-pay | Admitting: Emergency Medicine

## 2018-08-28 DIAGNOSIS — J449 Chronic obstructive pulmonary disease, unspecified: Secondary | ICD-10-CM | POA: Diagnosis not present

## 2018-08-28 DIAGNOSIS — N183 Chronic kidney disease, stage 3 (moderate): Secondary | ICD-10-CM | POA: Insufficient documentation

## 2018-08-28 DIAGNOSIS — Z79899 Other long term (current) drug therapy: Secondary | ICD-10-CM | POA: Diagnosis not present

## 2018-08-28 DIAGNOSIS — I129 Hypertensive chronic kidney disease with stage 1 through stage 4 chronic kidney disease, or unspecified chronic kidney disease: Secondary | ICD-10-CM | POA: Insufficient documentation

## 2018-08-28 DIAGNOSIS — N390 Urinary tract infection, site not specified: Secondary | ICD-10-CM | POA: Diagnosis not present

## 2018-08-28 DIAGNOSIS — R300 Dysuria: Secondary | ICD-10-CM | POA: Diagnosis present

## 2018-08-28 LAB — URINALYSIS, ROUTINE W REFLEX MICROSCOPIC
BACTERIA UA: NONE SEEN
Bilirubin Urine: NEGATIVE
Glucose, UA: NEGATIVE mg/dL
HGB URINE DIPSTICK: NEGATIVE
Ketones, ur: NEGATIVE mg/dL
Nitrite: POSITIVE — AB
PROTEIN: 100 mg/dL — AB
Specific Gravity, Urine: 1.017 (ref 1.005–1.030)
WBC, UA: >50 WBC/hpf — ABNORMAL HIGH (ref 0–5)
pH: 6 (ref 5.0–8.0)

## 2018-08-28 MED ORDER — CEPHALEXIN 500 MG PO CAPS
500.0000 mg | ORAL_CAPSULE | Freq: Once | ORAL | Status: AC
  Administered 2018-08-28: 500 mg via ORAL
  Filled 2018-08-28: qty 1

## 2018-08-28 MED ORDER — FLUCONAZOLE 100 MG PO TABS
200.0000 mg | ORAL_TABLET | Freq: Once | ORAL | Status: AC
  Administered 2018-08-28: 200 mg via ORAL
  Filled 2018-08-28: qty 2

## 2018-08-28 MED ORDER — FLUCONAZOLE 200 MG PO TABS: 200.0000 mg | ORAL_TABLET | Freq: Every day | ORAL | 0 refills | Status: DC

## 2018-08-28 MED ORDER — CEPHALEXIN 500 MG PO CAPS: 500.0000 mg | ORAL_CAPSULE | Freq: Three times a day (TID) | ORAL | 0 refills | Status: DC

## 2018-08-28 MED ORDER — PHENAZOPYRIDINE HCL 200 MG PO TABS: 200.0000 mg | ORAL_TABLET | Freq: Three times a day (TID) | ORAL | 0 refills | Status: DC | PRN

## 2018-08-28 NOTE — ED Provider Notes (Signed)
[facility] EMERGENCY DEPARTMENT Provider Note   CSN: [id number] Arrival date & time: 08/28/18  1501     History   Chief Complaint Chief Complaint  Patient presents with  . Dysuria    HPI [name] is a 77 y.o. female.  HPI Patient presents with 1 week of dysuria and strong smelling odor to her urine.  States she was seen by her primary doctor 1 week ago and diagnosed with a urinary tract infection but was not given an antibiotic.  She denies nausea, vomiting or diarrhea.  No new back pain.  No fever or chills.  Patient states that she frequently gets urinary tract infections. Past Medical History:  Diagnosis Date  . Anxiety   . Arthritis   . Bronchitis   . CKD (chronic kidney disease) stage 3, GFR 30-59 ml/min (HCC) 05/04/2016  . COPD (chronic obstructive pulmonary disease) ([place])   . Depression   . Elevated LFTs 05/04/2016  . History of TIAs 1981   left side weakness  . HIV (human immunodeficiency virus infection) ([place])   . Hypertension   . Kidney disease related to HIV infection [facility])    pt states she has to have her creatinine level checked often   . Nausea 03/03/2017  . Peripheral neuropathy    bilateral feet  . Sinusitis 05/06/2018  . UTI (urinary tract infection) 03/03/2017  . Weight loss 03/03/2017    Patient Active Problem List   Diagnosis Date Noted  . Sinusitis 05/06/2018  . COPD (chronic obstructive pulmonary disease) ([place]) 12/17/2017  . Acute encephalopathy 12/17/2017  . Hypothyroidism 12/17/2017  . Steroid-induced psychosis, with hallucinations ([place])   . Acute bronchitis 03/30/2017  . UTI (urinary tract infection) 03/03/2017  . Nausea 03/03/2017  . Weight loss 03/03/2017  . Fever 07/22/2016  . CAP (community acquired pneumonia) 07/22/2016  . CKD (chronic kidney disease) stage 3, GFR 30-59 ml/min (HCC) 05/04/2016  . Elevated LFTs 05/04/2016  . 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 Breath shortness 06/19/2014  . Syncope 05/31/2014  . Rib fracture  05/31/2014  . Syncope and collapse 05/31/2014  . Weight gain 03/21/2014  . Cervical spondylosis 08/15/2013  . Neck pain on left side 05/24/2013  . Unspecified vitamin D deficiency 12/14/2012  . Vitamin D toxicity 12/14/2012  . Hemorrhoid 09/01/2012  . Myalgia 02/25/2012  . HTN (hypertension) 11/25/2011  . Cramps, muscle, general 08/18/2011  . RECTAL BLEEDING 11/03/2010  . DYSPHAGIA 11/03/2010  . FATIGUE 08/29/2010  . MUSCLE PAIN 06/04/2010  . Acute cystitis 05/12/2010  . RENAL INSUFFICIENCY 12/18/2008  . DEPRESSION 09/04/2008  . IRRITABLE BOWEL SYNDROME 05/22/2008  . HERNIATED CERVICAL DISC 10/05/2007  . HERNIATED LUMBAR DISC 10/05/2007  . History of urinary tract infection 09/20/2007  . HIV-1 associated autonomic neuropathy ([place]) 03/16/2007  . ELBOW PAIN 03/16/2007  . ISCHEMIC COLITIS 12/27/2006  . RENAL FAILURE NOS 12/27/2006  . Human immunodeficiency virus (HIV) disease ([place]) 08/27/2006    Past Surgical History:  Procedure Laterality Date  . ABDOMINAL HYSTERECTOMY  1985  . BACK SURGERY    . CATARACT EXTRACTION, BILATERAL    . CHOLECYSTECTOMY    . COLONOSCOPY  08/10/2002   NUR: Normal colonoscopy except for some hemorrhoids and some erythema at the dentate line  . COLONOSCOPY  12/10/2006   Edwards:Diffuse colitis in the transverse and descending colon/This is not entirely typical of ischemic colitis or her HIV positive/ disease.  We need to be concerned about other causes  . COLONOSCOPY  01/19/2011   RMR: Internal  and external hemorrhoids, likely source of hematochezia anal papilla, otherwise normal rectal mucosa/ Left-sided diverticula.  Cecal polyp with status post cold snare polypectomy.  Remainder of colonic mucosa appeared normal  . ESOPHAGOGASTRODUODENOSCOPY  08/10/2002     NUR: Mild changes of reflux esophagitis limited to gastroesophageal junction  No evidence of ring, stricture, or esophageal candidiasis dysphagia/ Gastritis, possibly H. pylori induced  .  ESOPHAGOGASTRODUODENOSCOPY  12/25/2002   NUR: Erosive antral gastritis.  The degree of gastritis is more significant than her last exam/ Normal examination of the esophagus/ Esophageal dilatation performed by passing 62 and 17 [place]  . ESOPHAGOGASTRODUODENOSCOPY  11/13/2010   RMR: Circumferential distal esophageal erosions with soft peptic stricture, consistent with erosive reflux esophagitis or stricture  formation, status post dilation as described above/  Small hiatal hernia/ Tiny antral erosions, otherwise normal stomach D1 and D2  . HEMORRHOID SURGERY  09/02/2012   Procedure: HEMORRHOIDECTOMY;  Surgeon: Jamesetta So, MD;  Location: [facility];  Service: General;  Laterality: N/A;  . TUBAL LIGATION       OB History    Gravida  4   Para  3   Term  2   Preterm  1   AB  1   Living  3     SAB  1   TAB      Ectopic      Multiple      Live Births               Home Medications    Prior to Admission medications   Medication Sig Start Date End Date Taking? Authorizing Provider  abacavir-dolutegravir-lamiVUDine (TRIUMEQ) 600-50-300 MG tablet Take 1 tablet by mouth daily. 04/04/18  Yes [name], Lavell Islam, MD  albuterol (PROVENTIL HFA;VENTOLIN HFA) 108 (90 Base) MCG/ACT inhaler Inhale 1-2 puffs into the lungs every 6 (six) hours as needed for wheezing or shortness of breath. 04/02/17  Yes Nat Christen, MD  amitriptyline (ELAVIL) 50 MG tablet Take 50 mg by mouth at bedtime.    Yes [provider]  Cholecalciferol (VITAMIN D3) 10000 units TABS Take 1 tablet by mouth daily.   Yes [provider]  diphenhydrAMINE (BENADRYL) 25 mg capsule Take 25 mg by mouth daily as needed for itching (itching from hydrocodone).   Yes [provider]  fluticasone (FLONASE) 50 MCG/ACT nasal spray Place 2 sprays into both nostrils at bedtime. 05/06/18  Yes [name], Lavell Islam, MD  furosemide (LASIX) 40 MG tablet Take 40 mg by mouth daily as needed for fluid.    Yes  [provider]  ipratropium-albuterol (DUONEB) 0.5-2.5 (3) MG/3ML SOLN Inhale 3 mLs into the lungs every 6 (six) hours as needed (shortness of breath).  04/02/17  Yes [provider]  LORazepam (ATIVAN) 1 MG tablet Take 1 mg by mouth 2 (two) times daily. 03/25/18  Yes [provider]  Multiple Vitamin (ONE-A-DAY 55 PLUS PO) Take 1 tablet by mouth daily.   Yes [provider]  nebivolol (BYSTOLIC) 10 MG tablet Take 10 mg by mouth daily.    Yes [provider]  ondansetron (ZOFRAN) 8 MG tablet Take 8 mg by mouth daily as needed for nausea or vomiting.    Yes [provider]  Oxycodone HCl 10 MG TABS Take 10 mg by mouth 4 (four) times daily as needed. For pain 03/25/17  Yes [provider]  polyvinyl alcohol (LIQUIFILM TEARS) 1.4 % ophthalmic solution Place 1 drop into both eyes as needed  for dry eyes.   Yes [provider]  thiamine (VITAMIN B-1) 100 MG tablet Take 100 mg by mouth daily.   Yes [provider]  cephALEXin (KEFLEX) 500 MG capsule Take 1 capsule (500 mg total) by mouth 3 (three) times daily. 08/29/18   Julianne Rice, MD  fluconazole (DIFLUCAN) 200 MG tablet Take 1 tablet (200 mg total) by mouth daily. 08/29/18   Julianne Rice, MD  guaiFENesin (MUCINEX) 600 MG 12 hr tablet Take 1 tablet (600 mg total) by mouth 2 (two) times daily. Patient not taking: Reported on 12/17/2017 03/30/17   Kathie Dike, MD  hydrALAZINE (APRESOLINE) 25 MG tablet Take 1 tablet (25 mg total) by mouth every 8 (eight) hours. Patient not taking: Reported on 04/04/2018 12/19/17   Kathie Dike, MD  hydrOXYzine (ATARAX/VISTARIL) 25 MG tablet Take 1 tablet (25 mg total) by mouth 3 (three) times daily as needed. Patient not taking: Reported on 04/04/2018 12/19/17   Kathie Dike, MD  phenazopyridine (PYRIDIUM) 200 MG tablet Take 1 tablet (200 mg total) by mouth 3 (three) times daily as needed for pain. 08/28/18   Julianne Rice, MD     Family History Family History  Problem Relation Age of Onset  . Diabetes Brother     Social History Social History   Tobacco Use  . Smoking status: Never Smoker  . Smokeless tobacco: Never Used  Substance Use Topics  . Alcohol use: No  . Drug use: No     Allergies   Bee venom; Tenofovir disoproxil fumarate [tenofovir disoproxil fumarate]; Compazine  [prochlorperazine edisylate]; Haloperidol lactate; Codeine; Navane [thiothixene]; Prednisone; Prochlorperazine; Promethazine hcl; Propoxyphene n-acetaminophen; and Morphine   Review of Systems Review of Systems  Constitutional: Negative for chills and fever.  Gastrointestinal: Negative for abdominal pain, constipation, diarrhea, nausea and vomiting.  Genitourinary: Positive for dysuria. Negative for difficulty urinating, flank pain, frequency, hematuria and pelvic pain.  Skin: Negative for rash.  Neurological: Negative for weakness and numbness.  All other systems reviewed and are negative.    Physical Exam Updated Vital Signs BP (!) 159/85 (BP Location: Left Arm)   Pulse 62   Temp 98.4 F (36.9 C) (Oral)   Resp 18   Ht 5\' 4"  (1.626 m)   Wt 88 kg   SpO2 99%   BMI 33.30 kg/m   Physical Exam  Constitutional: She is oriented to person, place, and time. She appears well-developed and well-nourished. No distress.  HENT:  Head: Normocephalic and atraumatic.  Eyes: Pupils are equal, round, and reactive to light. EOM are normal.  Neck: Normal range of motion. Neck supple.  Cardiovascular: Normal rate and regular rhythm.  Pulmonary/Chest: Effort normal and breath sounds normal.  Abdominal: Soft. Bowel sounds are normal. There is tenderness. There is no rebound and no guarding.  Very mild suprapubic tenderness to palpation.  There is no rebound or guarding  Musculoskeletal: Normal range of motion. She exhibits no edema or tenderness.  No midline thoracic or lumbar tenderness.  No CVA tenderness  Neurological: She is  alert and oriented to person, place, and time.  Skin: Skin is warm and dry. Capillary refill takes less than 2 seconds. No rash noted. She is not diaphoretic. No erythema.  Psychiatric: She has a normal mood and affect. Her behavior is normal.  Nursing note and vitals reviewed.    ED Treatments / Results  Labs (all labs ordered are listed, but only abnormal results are displayed) Labs Reviewed  URINALYSIS, ROUTINE W REFLEX MICROSCOPIC - Abnormal;  Notable for the following components:      Result Value   APPearance TURBID (*)    Protein, ur 100 (*)    Nitrite POSITIVE (*)    Leukocytes, UA MODERATE (*)    WBC, UA >50 (*)    All other components within normal limits  URINE CULTURE    EKG None  Radiology No results found.  Procedures Procedures (including critical care time)  Medications Ordered in ED Medications  cephALEXin (KEFLEX) capsule 500 mg (has no administration in time range)  fluconazole (DIFLUCAN) tablet 200 mg (has no administration in time range)     Initial Impression / Assessment and Plan / ED Course  I have reviewed the triage vital signs and the nursing notes.  Pertinent labs & imaging results that were available during my care of the patient were reviewed by me and considered in my medical decision making (see chart for details).     Nitrite positive UA with moderate leukocytes and greater than 50 white blood cells.  White blood cell clumps and budding yeast are present.  Urine was sent for culture.  Will start on Diflucan and Keflex.  She is encouraged to follow-up closely with her infectious disease physician to ensure improvement of her symptoms.  Return precautions given.  Final Clinical Impressions(s) / ED Diagnoses   Final diagnoses:  Lower urinary tract infectious disease    ED Discharge Orders         Ordered    fluconazole (DIFLUCAN) 200 MG tablet  Daily     08/28/18 1618    cephALEXin (KEFLEX) 500 MG capsule  3 times daily      08/28/18 1618    phenazopyridine (PYRIDIUM) 200 MG tablet  3 times daily PRN     08/28/18 1618           Julianne Rice, MD 08/28/18 1624

## 2018-08-28 NOTE — ED Triage Notes (Signed)
Painful urination with odor

## 2018-08-30 LAB — URINE CULTURE: Culture: >=100000 — AB

## 2018-08-31 ENCOUNTER — Telehealth: Payer: Self-pay | Admitting: Emergency Medicine

## 2018-08-31 NOTE — Telephone Encounter (Signed)
Post ED Visit - Positive Culture Follow-up  Culture report reviewed by antimicrobial stewardship pharmacist:  []  Elenor Quinones, Pharm.D. []  Heide Guile, Pharm.D., BCPS AQ-ID []  Parks Neptune, Pharm.D., BCPS []  Alycia Rossetti, Pharm.D., BCPS []  [place], Pharm.D., BCPS, AAHIVP []  Legrand Como, Pharm.D., BCPS, AAHIVP []  Salome Arnt, PharmD, BCPS []  Johnnette Gourd, PharmD, BCPS []  Hughes Better, PharmD, BCPS []  Leeroy Cha, PharmD Drema Dallas PharmD  Positive urine culture Treated with fluconazole and cephalexin, organism sensitive to the same and no further patient follow-up is required at this time.  Hazle Nordmann 08/31/2018, 10:37 AM

## 2018-09-30 ENCOUNTER — Other Ambulatory Visit: Payer: Self-pay | Admitting: Infectious Disease

## 2018-09-30 DIAGNOSIS — B20 Human immunodeficiency virus [HIV] disease: Secondary | ICD-10-CM

## 2018-10-11 DIAGNOSIS — K219 Gastro-esophageal reflux disease without esophagitis: Secondary | ICD-10-CM | POA: Diagnosis not present

## 2018-10-11 DIAGNOSIS — G8929 Other chronic pain: Secondary | ICD-10-CM | POA: Diagnosis not present

## 2018-10-11 DIAGNOSIS — N390 Urinary tract infection, site not specified: Secondary | ICD-10-CM | POA: Diagnosis not present

## 2018-10-11 DIAGNOSIS — E782 Mixed hyperlipidemia: Secondary | ICD-10-CM | POA: Diagnosis not present

## 2018-10-11 DIAGNOSIS — I10 Essential (primary) hypertension: Secondary | ICD-10-CM | POA: Diagnosis not present

## 2018-10-11 DIAGNOSIS — F418 Other specified anxiety disorders: Secondary | ICD-10-CM | POA: Diagnosis not present

## 2018-10-11 DIAGNOSIS — M5137 Other intervertebral disc degeneration, lumbosacral region: Secondary | ICD-10-CM | POA: Diagnosis not present

## 2018-10-11 DIAGNOSIS — B20 Human immunodeficiency virus [HIV] disease: Secondary | ICD-10-CM | POA: Diagnosis not present

## 2018-10-23 DIAGNOSIS — M5489 Other dorsalgia: Secondary | ICD-10-CM | POA: Diagnosis not present

## 2018-10-23 DIAGNOSIS — M545 Low back pain: Secondary | ICD-10-CM | POA: Diagnosis not present

## 2018-10-31 ENCOUNTER — Other Ambulatory Visit: Payer: Medicare HMO

## 2018-10-31 ENCOUNTER — Other Ambulatory Visit (HOSPITAL_COMMUNITY)
Admission: RE | Admit: 2018-10-31 | Discharge: 2018-10-31 | Disposition: A | Discharge status: Home / Self Care | Payer: Medicare HMO | Source: Ambulatory Visit | Attending: Infectious Disease | Admitting: Infectious Disease

## 2018-10-31 DIAGNOSIS — N183 Chronic kidney disease, stage 3 unspecified: Secondary | ICD-10-CM

## 2018-10-31 DIAGNOSIS — J0110 Acute frontal sinusitis, unspecified: Secondary | ICD-10-CM

## 2018-10-31 DIAGNOSIS — G909 Disorder of the autonomic nervous system, unspecified: Secondary | ICD-10-CM | POA: Insufficient documentation

## 2018-10-31 DIAGNOSIS — J309 Allergic rhinitis, unspecified: Secondary | ICD-10-CM

## 2018-10-31 DIAGNOSIS — Z113 Encounter for screening for infections with a predominantly sexual mode of transmission: Secondary | ICD-10-CM | POA: Diagnosis not present

## 2018-10-31 DIAGNOSIS — B20 Human immunodeficiency virus [HIV] disease: Secondary | ICD-10-CM

## 2018-10-31 DIAGNOSIS — Z79899 Other long term (current) drug therapy: Secondary | ICD-10-CM | POA: Diagnosis not present

## 2018-11-01 LAB — T-HELPER CELL (CD4) - (RCID CLINIC ONLY)
CD4 % Helper T Cell: 21 % — ABNORMAL LOW (ref 33–55)
CD4 T CELL ABS: 630 /uL (ref 400–2700)

## 2018-11-01 LAB — URINE CYTOLOGY ANCILLARY ONLY
CHLAMYDIA, DNA PROBE: NEGATIVE
NEISSERIA GONORRHEA: NEGATIVE

## 2018-11-02 LAB — COMPLETE METABOLIC PANEL WITH GFR
AG Ratio: 2 (calc) (ref 1.0–2.5)
ALKALINE PHOSPHATASE (APISO): 65 U/L (ref 33–130)
ALT: 16 U/L (ref 6–29)
AST: 17 U/L (ref 10–35)
Albumin: 4 g/dL (ref 3.6–5.1)
BILIRUBIN TOTAL: 0.7 mg/dL (ref 0.2–1.2)
BUN/Creatinine Ratio: 13 (calc) (ref 6–22)
BUN: 21 mg/dL (ref 7–25)
CHLORIDE: 108 mmol/L (ref 98–110)
CO2: 27 mmol/L (ref 20–32)
Calcium: 9.7 mg/dL (ref 8.6–10.4)
Creat: 1.61 mg/dL — ABNORMAL HIGH (ref 0.60–0.93)
GFR, Est African American: 35 mL/min/{1.73_m2} — ABNORMAL LOW (ref >=60)
GFR, Est Non African American: 31 mL/min/{1.73_m2} — ABNORMAL LOW (ref >=60)
Globulin: 2 g/dL (calc) (ref 1.9–3.7)
Glucose, Bld: 117 mg/dL — ABNORMAL HIGH (ref 65–99)
POTASSIUM: 4.8 mmol/L (ref 3.5–5.3)
Sodium: 142 mmol/L (ref 135–146)
Total Protein: 6 g/dL — ABNORMAL LOW (ref 6.1–8.1)

## 2018-11-02 LAB — CBC WITH DIFFERENTIAL/PLATELET
ABSOLUTE MONOCYTES: 584 {cells}/uL (ref 200–950)
BASOS PCT: 0.5 %
Basophils Absolute: 37 cells/uL (ref 0–200)
Eosinophils Absolute: 219 cells/uL (ref 15–500)
Eosinophils Relative: 3 %
HCT: 38.6 % (ref 35.0–45.0)
Hemoglobin: 13.2 g/dL (ref 11.7–15.5)
Lymphs Abs: 2832 cells/uL (ref 850–3900)
MCH: 34.1 pg — ABNORMAL HIGH (ref 27.0–33.0)
MCHC: 34.2 g/dL (ref 32.0–36.0)
MCV: 99.7 fL (ref 80.0–100.0)
MPV: 9.6 fL (ref 7.5–12.5)
Monocytes Relative: 8 %
Neutro Abs: 3628 cells/uL (ref 1500–7800)
Neutrophils Relative %: 49.7 %
PLATELETS: 189 10*3/uL (ref 140–400)
RBC: 3.87 10*6/uL (ref 3.80–5.10)
RDW: 12.1 % (ref 11.0–15.0)
TOTAL LYMPHOCYTE: 38.8 %
WBC: 7.3 10*3/uL (ref 3.8–10.8)

## 2018-11-02 LAB — LIPID PANEL
CHOLESTEROL: 203 mg/dL — AB (ref <200)
HDL: 26 mg/dL — AB (ref >50)
Non-HDL Cholesterol (Calc): 177 mg/dL (calc) — ABNORMAL HIGH (ref <130)
Total CHOL/HDL Ratio: 7.8 (calc) — ABNORMAL HIGH (ref <5.0)
Triglycerides: 576 mg/dL — ABNORMAL HIGH (ref <150)

## 2018-11-02 LAB — HIV-1 RNA QUANT-NO REFLEX-BLD
HIV 1 RNA QUANT: NOT DETECTED {copies}/mL
HIV-1 RNA QUANT, LOG: NOT DETECTED {Log_copies}/mL

## 2018-11-02 LAB — RPR: RPR: NONREACTIVE

## 2018-11-12 ENCOUNTER — Emergency Department (HOSPITAL_COMMUNITY): Payer: Medicare HMO | Admitting: Anesthesiology

## 2018-11-12 ENCOUNTER — Encounter (HOSPITAL_COMMUNITY)
Admission: EM | Disposition: A | Discharge status: Home Health | Payer: Self-pay | Source: Home / Self Care | Attending: General Surgery

## 2018-11-12 ENCOUNTER — Inpatient Hospital Stay (HOSPITAL_COMMUNITY)
Admission: EM | Admit: 2018-11-12 | Discharge: 2018-11-21 | DRG: 969 | Disposition: A | Discharge status: Home Health | Payer: Medicare HMO | Attending: General Surgery | Admitting: General Surgery

## 2018-11-12 ENCOUNTER — Encounter (HOSPITAL_COMMUNITY): Payer: Self-pay | Admitting: Emergency Medicine

## 2018-11-12 ENCOUNTER — Inpatient Hospital Stay (HOSPITAL_COMMUNITY): Payer: Medicare HMO

## 2018-11-12 ENCOUNTER — Emergency Department (HOSPITAL_COMMUNITY): Payer: Medicare HMO

## 2018-11-12 ENCOUNTER — Other Ambulatory Visit: Payer: Self-pay

## 2018-11-12 DIAGNOSIS — E872 Acidosis, unspecified: Secondary | ICD-10-CM | POA: Diagnosis present

## 2018-11-12 DIAGNOSIS — T402X5A Adverse effect of other opioids, initial encounter: Secondary | ICD-10-CM | POA: Diagnosis not present

## 2018-11-12 DIAGNOSIS — I9789 Other postprocedural complications and disorders of the circulatory system, not elsewhere classified: Secondary | ICD-10-CM | POA: Diagnosis not present

## 2018-11-12 DIAGNOSIS — Z91030 Bee allergy status: Secondary | ICD-10-CM | POA: Diagnosis not present

## 2018-11-12 DIAGNOSIS — F329 Major depressive disorder, single episode, unspecified: Secondary | ICD-10-CM | POA: Diagnosis present

## 2018-11-12 DIAGNOSIS — J449 Chronic obstructive pulmonary disease, unspecified: Secondary | ICD-10-CM | POA: Diagnosis not present

## 2018-11-12 DIAGNOSIS — J209 Acute bronchitis, unspecified: Secondary | ICD-10-CM

## 2018-11-12 DIAGNOSIS — K668 Other specified disorders of peritoneum: Secondary | ICD-10-CM | POA: Insufficient documentation

## 2018-11-12 DIAGNOSIS — Z888 Allergy status to other drugs, medicaments and biological substances status: Secondary | ICD-10-CM

## 2018-11-12 DIAGNOSIS — Z452 Encounter for adjustment and management of vascular access device: Secondary | ICD-10-CM

## 2018-11-12 DIAGNOSIS — R6521 Severe sepsis with septic shock: Secondary | ICD-10-CM | POA: Diagnosis not present

## 2018-11-12 DIAGNOSIS — A419 Sepsis, unspecified organism: Principal | ICD-10-CM | POA: Diagnosis present

## 2018-11-12 DIAGNOSIS — N1832 Chronic kidney disease, stage 3b: Secondary | ICD-10-CM | POA: Diagnosis present

## 2018-11-12 DIAGNOSIS — I4891 Unspecified atrial fibrillation: Secondary | ICD-10-CM | POA: Diagnosis not present

## 2018-11-12 DIAGNOSIS — K633 Ulcer of intestine: Secondary | ICD-10-CM | POA: Diagnosis not present

## 2018-11-12 DIAGNOSIS — G629 Polyneuropathy, unspecified: Secondary | ICD-10-CM | POA: Diagnosis present

## 2018-11-12 DIAGNOSIS — N3090 Cystitis, unspecified without hematuria: Secondary | ICD-10-CM | POA: Diagnosis present

## 2018-11-12 DIAGNOSIS — K659 Peritonitis, unspecified: Secondary | ICD-10-CM | POA: Diagnosis present

## 2018-11-12 DIAGNOSIS — Z79899 Other long term (current) drug therapy: Secondary | ICD-10-CM | POA: Diagnosis not present

## 2018-11-12 DIAGNOSIS — E039 Hypothyroidism, unspecified: Secondary | ICD-10-CM | POA: Diagnosis not present

## 2018-11-12 DIAGNOSIS — N183 Chronic kidney disease, stage 3 unspecified: Secondary | ICD-10-CM | POA: Diagnosis present

## 2018-11-12 DIAGNOSIS — Z885 Allergy status to narcotic agent status: Secondary | ICD-10-CM | POA: Diagnosis not present

## 2018-11-12 DIAGNOSIS — Z6834 Body mass index (BMI) 34.0-34.9, adult: Secondary | ICD-10-CM | POA: Diagnosis not present

## 2018-11-12 DIAGNOSIS — F419 Anxiety disorder, unspecified: Secondary | ICD-10-CM | POA: Diagnosis not present

## 2018-11-12 DIAGNOSIS — J9811 Atelectasis: Secondary | ICD-10-CM | POA: Diagnosis not present

## 2018-11-12 DIAGNOSIS — K658 Other peritonitis: Secondary | ICD-10-CM | POA: Diagnosis present

## 2018-11-12 DIAGNOSIS — B9620 Unspecified Escherichia coli [E. coli] as the cause of diseases classified elsewhere: Secondary | ICD-10-CM | POA: Diagnosis present

## 2018-11-12 DIAGNOSIS — E669 Obesity, unspecified: Secondary | ICD-10-CM | POA: Diagnosis present

## 2018-11-12 DIAGNOSIS — R0603 Acute respiratory distress: Secondary | ICD-10-CM | POA: Diagnosis present

## 2018-11-12 DIAGNOSIS — I129 Hypertensive chronic kidney disease with stage 1 through stage 4 chronic kidney disease, or unspecified chronic kidney disease: Secondary | ICD-10-CM | POA: Diagnosis not present

## 2018-11-12 DIAGNOSIS — I97191 Other postprocedural cardiac functional disturbances following other surgery: Secondary | ICD-10-CM | POA: Diagnosis not present

## 2018-11-12 DIAGNOSIS — I10 Essential (primary) hypertension: Secondary | ICD-10-CM | POA: Diagnosis present

## 2018-11-12 DIAGNOSIS — J811 Chronic pulmonary edema: Secondary | ICD-10-CM

## 2018-11-12 DIAGNOSIS — Z833 Family history of diabetes mellitus: Secondary | ICD-10-CM

## 2018-11-12 DIAGNOSIS — R062 Wheezing: Secondary | ICD-10-CM

## 2018-11-12 DIAGNOSIS — K631 Perforation of intestine (nontraumatic): Secondary | ICD-10-CM

## 2018-11-12 DIAGNOSIS — I9581 Postprocedural hypotension: Secondary | ICD-10-CM | POA: Diagnosis not present

## 2018-11-12 DIAGNOSIS — Z87440 Personal history of urinary (tract) infections: Secondary | ICD-10-CM

## 2018-11-12 DIAGNOSIS — N3000 Acute cystitis without hematuria: Secondary | ICD-10-CM | POA: Diagnosis present

## 2018-11-12 DIAGNOSIS — K5903 Drug induced constipation: Secondary | ICD-10-CM | POA: Diagnosis not present

## 2018-11-12 DIAGNOSIS — G8929 Other chronic pain: Secondary | ICD-10-CM | POA: Diagnosis present

## 2018-11-12 DIAGNOSIS — Z8673 Personal history of transient ischemic attack (TIA), and cerebral infarction without residual deficits: Secondary | ICD-10-CM

## 2018-11-12 DIAGNOSIS — R000 Tachycardia, unspecified: Secondary | ICD-10-CM | POA: Diagnosis not present

## 2018-11-12 DIAGNOSIS — D3502 Benign neoplasm of left adrenal gland: Secondary | ICD-10-CM | POA: Diagnosis not present

## 2018-11-12 DIAGNOSIS — B20 Human immunodeficiency virus [HIV] disease: Secondary | ICD-10-CM | POA: Diagnosis present

## 2018-11-12 DIAGNOSIS — R0989 Other specified symptoms and signs involving the circulatory and respiratory systems: Secondary | ICD-10-CM | POA: Diagnosis not present

## 2018-11-12 DIAGNOSIS — K55049 Acute infarction of large intestine, extent unspecified: Secondary | ICD-10-CM | POA: Diagnosis not present

## 2018-11-12 HISTORY — PX: COLOSTOMY: SHX63

## 2018-11-12 HISTORY — PX: LAPAROTOMY: SHX154

## 2018-11-12 HISTORY — DX: Ulcer of intestine: K63.3

## 2018-11-12 HISTORY — DX: Perforation of intestine (nontraumatic): K63.1

## 2018-11-12 HISTORY — PX: PARTIAL COLECTOMY: SHX5273

## 2018-11-12 LAB — URINALYSIS, ROUTINE W REFLEX MICROSCOPIC
BACTERIA UA: NONE SEEN
BILIRUBIN URINE: NEGATIVE
Bacteria, UA: NONE SEEN
Bilirubin Urine: NEGATIVE
GLUCOSE, UA: NEGATIVE mg/dL
Glucose, UA: NEGATIVE mg/dL
Hgb urine dipstick: NEGATIVE
Ketones, ur: 5 mg/dL — AB
Ketones, ur: NEGATIVE mg/dL
Leukocytes, UA: NEGATIVE
NITRITE: POSITIVE — AB
Nitrite: POSITIVE — AB
PH: 5 (ref 5.0–8.0)
Protein, ur: 100 mg/dL — AB
Protein, ur: 100 mg/dL — AB
SPECIFIC GRAVITY, URINE: 1.015 (ref 1.005–1.030)
Specific Gravity, Urine: 1.02 (ref 1.005–1.030)
WBC, UA: >50 WBC/hpf — ABNORMAL HIGH (ref 0–5)
WBC, UA: >50 WBC/hpf — ABNORMAL HIGH (ref 0–5)
pH: 5 (ref 5.0–8.0)

## 2018-11-12 LAB — COMPREHENSIVE METABOLIC PANEL
ALBUMIN: 4.1 g/dL (ref 3.5–5.0)
ALK PHOS: 64 U/L (ref 38–126)
ALT: 20 U/L (ref 0–44)
AST: 24 U/L (ref 15–41)
Anion gap: 9 (ref 5–15)
BILIRUBIN TOTAL: 1.3 mg/dL — AB (ref 0.3–1.2)
BUN: 18 mg/dL (ref 8–23)
CALCIUM: 10 mg/dL (ref 8.9–10.3)
CO2: 23 mmol/L (ref 22–32)
CREATININE: 1.78 mg/dL — AB (ref 0.44–1.00)
Chloride: 107 mmol/L (ref 98–111)
GFR calc Af Amer: 31 mL/min — ABNORMAL LOW (ref >60)
GFR calc non Af Amer: 27 mL/min — ABNORMAL LOW (ref >60)
GLUCOSE: 157 mg/dL — AB (ref 70–99)
POTASSIUM: 4 mmol/L (ref 3.5–5.1)
Sodium: 139 mmol/L (ref 135–145)
TOTAL PROTEIN: 6.4 g/dL — AB (ref 6.5–8.1)

## 2018-11-12 LAB — CBC WITH DIFFERENTIAL/PLATELET
ABS IMMATURE GRANULOCYTES: 0.04 10*3/uL (ref 0.00–0.07)
BASOS ABS: 0 10*3/uL (ref 0.0–0.1)
Basophils Relative: 1 %
Eosinophils Absolute: 0.1 10*3/uL (ref 0.0–0.5)
Eosinophils Relative: 1 %
HEMATOCRIT: 42.2 % (ref 36.0–46.0)
HEMOGLOBIN: 14.1 g/dL (ref 12.0–15.0)
IMMATURE GRANULOCYTES: 1 %
LYMPHS ABS: 3.7 10*3/uL (ref 0.7–4.0)
LYMPHS PCT: 43 %
MCH: 33.7 pg (ref 26.0–34.0)
MCHC: 33.4 g/dL (ref 30.0–36.0)
MCV: 101 fL — AB (ref 80.0–100.0)
MONOS PCT: 5 %
Monocytes Absolute: 0.5 10*3/uL (ref 0.1–1.0)
NEUTROS PCT: 49 %
NRBC: 0 % (ref 0.0–0.2)
Neutro Abs: 4.3 10*3/uL (ref 1.7–7.7)
Platelets: 202 10*3/uL (ref 150–400)
RBC: 4.18 MIL/uL (ref 3.87–5.11)
RDW: 12 % (ref 11.5–15.5)
WBC: 8.7 10*3/uL (ref 4.0–10.5)

## 2018-11-12 LAB — LACTIC ACID, PLASMA
Lactic Acid, Venous: 3.7 mmol/L (ref 0.5–1.9)
Lactic Acid, Venous: 5.8 mmol/L (ref 0.5–1.9)

## 2018-11-12 LAB — LIPASE, BLOOD: Lipase: 24 U/L (ref 11–51)

## 2018-11-12 LAB — MRSA PCR SCREENING: MRSA by PCR: NEGATIVE

## 2018-11-12 SURGERY — LAPAROTOMY, EXPLORATORY
Anesthesia: General

## 2018-11-12 MED ORDER — METOPROLOL TARTRATE 5 MG/5ML IV SOLN: 5.0000 mg | Freq: Four times a day (QID) | INTRAVENOUS | Status: DC

## 2018-11-12 MED ORDER — FLUCONAZOLE 150 MG PO TABS
150.0000 mg | ORAL_TABLET | Freq: Every day | ORAL | Status: DC
  Administered 2018-11-13 – 2018-11-18 (×6): 150 mg via ORAL
  Filled 2018-11-12 (×6): qty 1

## 2018-11-12 MED ORDER — EPHEDRINE SULFATE 50 MG/ML IJ SOLN
INTRAMUSCULAR | Status: DC | PRN
  Administered 2018-11-12 (×2): 5 mg via INTRAVENOUS

## 2018-11-12 MED ORDER — LACTATED RINGERS IV SOLN
INTRAVENOUS | Status: DC
  Administered 2018-11-12 – 2018-11-13 (×2): via INTRAVENOUS
  Administered 2018-11-13: 500 mL via INTRAVENOUS
  Administered 2018-11-13 – 2018-11-20 (×8): via INTRAVENOUS

## 2018-11-12 MED ORDER — NEBIVOLOL HCL 10 MG PO TABS: 10.0000 mg | ORAL_TABLET | Freq: Every day | ORAL | Status: DC

## 2018-11-12 MED ORDER — SUGAMMADEX SODIUM 500 MG/5ML IV SOLN
INTRAVENOUS | Status: DC | PRN
  Administered 2018-11-12: 167 mg via INTRAVENOUS

## 2018-11-12 MED ORDER — ONDANSETRON HCL 4 MG/2ML IJ SOLN
4.0000 mg | Freq: Four times a day (QID) | INTRAMUSCULAR | Status: DC | PRN
  Administered 2018-11-12 – 2018-11-20 (×5): 4 mg via INTRAVENOUS
  Filled 2018-11-12 (×5): qty 2

## 2018-11-12 MED ORDER — LACTATED RINGERS IV SOLN: INTRAVENOUS | Status: DC

## 2018-11-12 MED ORDER — METOPROLOL TARTRATE 5 MG/5ML IV SOLN: 5.0000 mg | Freq: Four times a day (QID) | INTRAVENOUS | Status: DC | PRN

## 2018-11-12 MED ORDER — HYDROMORPHONE HCL 1 MG/ML IJ SOLN
0.5000 mg | INTRAMUSCULAR | Status: DC | PRN
  Administered 2018-11-12 – 2018-11-20 (×18): 0.5 mg via INTRAVENOUS
  Filled 2018-11-12 (×18): qty 0.5

## 2018-11-12 MED ORDER — HYDROMORPHONE HCL 1 MG/ML IJ SOLN
1.0000 mg | Freq: Once | INTRAMUSCULAR | Status: AC
  Administered 2018-11-12: 1 mg via INTRAVENOUS
  Filled 2018-11-12: qty 1

## 2018-11-12 MED ORDER — ONDANSETRON 4 MG PO TBDP: 4.0000 mg | ORAL_TABLET | Freq: Four times a day (QID) | ORAL | Status: DC | PRN

## 2018-11-12 MED ORDER — FLUTICASONE PROPIONATE 50 MCG/ACT NA SUSP
2.0000 | Freq: Every day | NASAL | Status: DC
  Administered 2018-11-15 – 2018-11-20 (×5): 2 via NASAL
  Filled 2018-11-12 (×4): qty 16

## 2018-11-12 MED ORDER — ALBUTEROL SULFATE (2.5 MG/3ML) 0.083% IN NEBU: 3.0000 mL | INHALATION_SOLUTION | Freq: Four times a day (QID) | RESPIRATORY_TRACT | Status: DC | PRN

## 2018-11-12 MED ORDER — SODIUM CHLORIDE 0.9 % IV BOLUS (SEPSIS)
1000.0000 mL | Freq: Once | INTRAVENOUS | Status: AC
  Administered 2018-11-12: 1000 mL via INTRAVENOUS

## 2018-11-12 MED ORDER — ONDANSETRON HCL 4 MG/2ML IJ SOLN
INTRAMUSCULAR | Status: AC
  Filled 2018-11-12: qty 2

## 2018-11-12 MED ORDER — METOPROLOL TARTRATE 5 MG/5ML IV SOLN
INTRAVENOUS | Status: DC | PRN
  Administered 2018-11-12: 2 mg via INTRAVENOUS

## 2018-11-12 MED ORDER — SUCCINYLCHOLINE CHLORIDE 20 MG/ML IJ SOLN
INTRAMUSCULAR | Status: DC | PRN
  Administered 2018-11-12: 120 mg via INTRAVENOUS

## 2018-11-12 MED ORDER — FENTANYL CITRATE (PF) 250 MCG/5ML IJ SOLN
INTRAMUSCULAR | Status: AC
  Filled 2018-11-12: qty 10

## 2018-11-12 MED ORDER — ONDANSETRON HCL 4 MG/2ML IJ SOLN
4.0000 mg | Freq: Once | INTRAMUSCULAR | Status: AC
  Administered 2018-11-12: 4 mg via INTRAVENOUS
  Filled 2018-11-12: qty 2

## 2018-11-12 MED ORDER — PROPOFOL 10 MG/ML IV BOLUS
INTRAVENOUS | Status: AC
  Filled 2018-11-12: qty 20

## 2018-11-12 MED ORDER — METRONIDAZOLE IN NACL 5-0.79 MG/ML-% IV SOLN
500.0000 mg | Freq: Once | INTRAVENOUS | Status: AC
  Administered 2018-11-12: 500 mg via INTRAVENOUS
  Filled 2018-11-12: qty 100

## 2018-11-12 MED ORDER — LACTATED RINGERS IV SOLN
INTRAVENOUS | Status: DC | PRN
  Administered 2018-11-12: 17:00:00 via INTRAVENOUS

## 2018-11-12 MED ORDER — DIPHENHYDRAMINE HCL 50 MG/ML IJ SOLN
12.5000 mg | Freq: Four times a day (QID) | INTRAMUSCULAR | Status: DC | PRN
  Administered 2018-11-19: 12.5 mg via INTRAVENOUS
  Filled 2018-11-12: qty 1

## 2018-11-12 MED ORDER — FENTANYL CITRATE (PF) 100 MCG/2ML IJ SOLN
50.0000 ug | Freq: Once | INTRAMUSCULAR | Status: AC
  Administered 2018-11-12: 50 ug via INTRAVENOUS
  Filled 2018-11-12: qty 2

## 2018-11-12 MED ORDER — PHENYLEPHRINE HCL 10 MG/ML IJ SOLN
INTRAMUSCULAR | Status: DC | PRN
  Administered 2018-11-12 (×4): 100 ug via INTRAVENOUS

## 2018-11-12 MED ORDER — SUGAMMADEX SODIUM 500 MG/5ML IV SOLN
INTRAVENOUS | Status: AC
  Filled 2018-11-12: qty 5

## 2018-11-12 MED ORDER — SODIUM CHLORIDE 0.9 % IR SOLN
Status: DC | PRN
  Administered 2018-11-12 (×3): 1000 mL

## 2018-11-12 MED ORDER — MIDAZOLAM HCL 2 MG/2ML IJ SOLN: 0.5000 mg | Freq: Once | INTRAMUSCULAR | Status: DC | PRN

## 2018-11-12 MED ORDER — DOCUSATE SODIUM 100 MG PO CAPS
100.0000 mg | ORAL_CAPSULE | Freq: Two times a day (BID) | ORAL | Status: DC
  Administered 2018-11-13 – 2018-11-21 (×17): 100 mg via ORAL
  Filled 2018-11-12 (×17): qty 1

## 2018-11-12 MED ORDER — NOREPINEPHRINE-SODIUM CHLORIDE 4-0.9 MG/250ML-% IV SOLN
0.0000 ug/min | INTRAVENOUS | Status: DC
  Administered 2018-11-12: 16 ug/min via INTRAVENOUS
  Administered 2018-11-13: 15.013 ug/min via INTRAVENOUS
  Administered 2018-11-13: 18 ug/min via INTRAVENOUS
  Administered 2018-11-13: 23.013 ug/min via INTRAVENOUS
  Administered 2018-11-13: 13 ug/min via INTRAVENOUS
  Administered 2018-11-13: 15 ug/min via INTRAVENOUS
  Administered 2018-11-13: 23 ug/min via INTRAVENOUS
  Administered 2018-11-14: 2 ug/min via INTRAVENOUS
  Filled 2018-11-12 (×2): qty 250
  Filled 2018-11-12: qty 500
  Filled 2018-11-12 (×4): qty 250

## 2018-11-12 MED ORDER — DIPHENHYDRAMINE HCL 12.5 MG/5ML PO ELIX: 12.5000 mg | ORAL_SOLUTION | Freq: Four times a day (QID) | ORAL | Status: DC | PRN

## 2018-11-12 MED ORDER — ABACAVIR-DOLUTEGRAVIR-LAMIVUD 600-50-300 MG PO TABS
1.0000 | ORAL_TABLET | Freq: Every day | ORAL | Status: DC
  Administered 2018-11-14 – 2018-11-21 (×8): 1 via ORAL
  Filled 2018-11-12 (×11): qty 1

## 2018-11-12 MED ORDER — METOPROLOL TARTRATE 5 MG/5ML IV SOLN
INTRAVENOUS | Status: AC
  Filled 2018-11-12: qty 5

## 2018-11-12 MED ORDER — ONDANSETRON HCL 4 MG/2ML IJ SOLN
INTRAMUSCULAR | Status: DC | PRN
  Administered 2018-11-12: 4 mg via INTRAVENOUS

## 2018-11-12 MED ORDER — FENTANYL CITRATE (PF) 100 MCG/2ML IJ SOLN
INTRAMUSCULAR | Status: DC | PRN
  Administered 2018-11-12 (×2): 50 ug via INTRAVENOUS
  Administered 2018-11-12 (×2): 25 ug via INTRAVENOUS

## 2018-11-12 MED ORDER — ROCURONIUM BROMIDE 10 MG/ML (PF) SYRINGE
PREFILLED_SYRINGE | INTRAVENOUS | Status: AC
  Filled 2018-11-12: qty 10

## 2018-11-12 MED ORDER — ROCURONIUM BROMIDE 100 MG/10ML IV SOLN
INTRAVENOUS | Status: DC | PRN
  Administered 2018-11-12: 15 mg via INTRAVENOUS
  Administered 2018-11-12: 5 mg via INTRAVENOUS
  Administered 2018-11-12: 10 mg via INTRAVENOUS

## 2018-11-12 MED ORDER — CEFAZOLIN SODIUM-DEXTROSE 1-4 GM/50ML-% IV SOLN
INTRAVENOUS | Status: DC | PRN
  Administered 2018-11-12: 2 g via INTRAVENOUS

## 2018-11-12 MED ORDER — PHENYLEPHRINE 40 MCG/ML (10ML) SYRINGE FOR IV PUSH (FOR BLOOD PRESSURE SUPPORT)
PREFILLED_SYRINGE | INTRAVENOUS | Status: AC
  Filled 2018-11-12: qty 10

## 2018-11-12 MED ORDER — SODIUM CHLORIDE 0.9 % IV BOLUS: 1000.0000 mL | Freq: Once | INTRAVENOUS | Status: DC

## 2018-11-12 MED ORDER — SODIUM CHLORIDE 0.9 % IV SOLN
1000.0000 mL | INTRAVENOUS | Status: DC
  Administered 2018-11-12: 1000 mL via INTRAVENOUS

## 2018-11-12 MED ORDER — LIDOCAINE HCL (CARDIAC) PF 50 MG/5ML IV SOSY
PREFILLED_SYRINGE | INTRAVENOUS | Status: DC | PRN
  Administered 2018-11-12: 50 mg via INTRAVENOUS

## 2018-11-12 MED ORDER — PANTOPRAZOLE SODIUM 40 MG IV SOLR
40.0000 mg | Freq: Every day | INTRAVENOUS | Status: DC
  Administered 2018-11-12 – 2018-11-20 (×9): 40 mg via INTRAVENOUS
  Filled 2018-11-12 (×9): qty 40

## 2018-11-12 MED ORDER — SUCCINYLCHOLINE CHLORIDE 200 MG/10ML IV SOSY
PREFILLED_SYRINGE | INTRAVENOUS | Status: AC
  Filled 2018-11-12: qty 10

## 2018-11-12 MED ORDER — PIPERACILLIN-TAZOBACTAM 3.375 G IVPB
3.3750 g | Freq: Three times a day (TID) | INTRAVENOUS | Status: AC
  Administered 2018-11-12 – 2018-11-16 (×12): 3.375 g via INTRAVENOUS
  Filled 2018-11-12 (×12): qty 50

## 2018-11-12 MED ORDER — LACTATED RINGERS IV BOLUS
1000.0000 mL | Freq: Once | INTRAVENOUS | Status: AC
  Administered 2018-11-12: 1000 mL via INTRAVENOUS

## 2018-11-12 MED ORDER — LIDOCAINE HCL (PF) 1 % IJ SOLN
INTRAMUSCULAR | Status: AC
  Administered 2018-11-12: 1 mL via SUBCUTANEOUS
  Filled 2018-11-12: qty 5

## 2018-11-12 MED ORDER — LIDOCAINE 2% (20 MG/ML) 5 ML SYRINGE
INTRAMUSCULAR | Status: AC
  Filled 2018-11-12: qty 5

## 2018-11-12 MED ORDER — HEPARIN SODIUM (PORCINE) 5000 UNIT/ML IJ SOLN
5000.0000 [IU] | Freq: Three times a day (TID) | INTRAMUSCULAR | Status: DC
  Administered 2018-11-12 – 2018-11-20 (×23): 5000 [IU] via SUBCUTANEOUS
  Filled 2018-11-12 (×22): qty 1

## 2018-11-12 MED ORDER — POLYVINYL ALCOHOL 1.4 % OP SOLN
1.0000 [drp] | OPHTHALMIC | Status: DC | PRN
  Administered 2018-11-13 – 2018-11-20 (×7): 1 [drp] via OPHTHALMIC
  Filled 2018-11-12: qty 15

## 2018-11-12 MED ORDER — HYDRALAZINE HCL 20 MG/ML IJ SOLN
10.0000 mg | INTRAMUSCULAR | Status: DC | PRN
  Administered 2018-11-17 – 2018-11-20 (×2): 10 mg via INTRAVENOUS
  Filled 2018-11-12 (×2): qty 1

## 2018-11-12 MED ORDER — IPRATROPIUM-ALBUTEROL 0.5-2.5 (3) MG/3ML IN SOLN: 3.0000 mL | Freq: Four times a day (QID) | RESPIRATORY_TRACT | Status: DC | PRN

## 2018-11-12 MED ORDER — SODIUM CHLORIDE 0.9 % IV SOLN
2.0000 g | Freq: Once | INTRAVENOUS | Status: AC
  Administered 2018-11-12: 2 g via INTRAVENOUS
  Filled 2018-11-12: qty 2

## 2018-11-12 MED ORDER — PROPOFOL 10 MG/ML IV BOLUS
INTRAVENOUS | Status: DC | PRN
  Administered 2018-11-12: 100 mg via INTRAVENOUS

## 2018-11-12 MED ORDER — SODIUM CHLORIDE 0.9 % IV BOLUS
1000.0000 mL | Freq: Once | INTRAVENOUS | Status: AC
  Administered 2018-11-12: 1000 mL via INTRAVENOUS

## 2018-11-12 SURGICAL SUPPLY — 62 items
APPLIER CLIP 11 MED OPEN (CLIP)
APPLIER CLIP 13 LRG OPEN (CLIP)
BARRIER SKIN 2 3/4 (OSTOMY) ×2 IMPLANT
CELLS DAT CNTRL 66122 CELL SVR (MISCELLANEOUS) IMPLANT
CHLORAPREP W/TINT 26ML (MISCELLANEOUS) ×2 IMPLANT
CLAMP POUCH DRAINAGE QUIET (OSTOMY) ×2 IMPLANT
CLIP APPLIE 11 MED OPEN (CLIP) IMPLANT
CLIP APPLIE 13 LRG OPEN (CLIP) IMPLANT
CLOTH BEACON ORANGE TIMEOUT ST (SAFETY) ×2 IMPLANT
COVER LIGHT HANDLE STERIS (MISCELLANEOUS) ×4 IMPLANT
DRAPE WARM FLUID 44X44 (DRAPE) ×2 IMPLANT
DRSG OPSITE POSTOP 4X10 (GAUZE/BANDAGES/DRESSINGS) IMPLANT
ELECT BLADE 6 FLAT ULTRCLN (ELECTRODE) IMPLANT
ELECT REM PT RETURN 9FT ADLT (ELECTROSURGICAL) ×2
ELECTRODE REM PT RTRN 9FT ADLT (ELECTROSURGICAL) ×1 IMPLANT
GAUZE SPONGE 4X4 12PLY STRL (GAUZE/BANDAGES/DRESSINGS) ×2 IMPLANT
GLOVE BIO SURGEON STRL SZ 6.5 (GLOVE) ×4 IMPLANT
GLOVE BIOGEL PI IND STRL 6.5 (GLOVE) ×2 IMPLANT
GLOVE BIOGEL PI IND STRL 7.0 (GLOVE) ×2 IMPLANT
GLOVE BIOGEL PI IND STRL 7.5 (GLOVE) ×2 IMPLANT
GLOVE BIOGEL PI INDICATOR 6.5 (GLOVE) ×2
GLOVE BIOGEL PI INDICATOR 7.0 (GLOVE) ×2
GLOVE BIOGEL PI INDICATOR 7.5 (GLOVE) ×2
GLOVE SURG SS PI 7.5 STRL IVOR (GLOVE) ×2 IMPLANT
GOWN STRL REUS W/TWL LRG LVL3 (GOWN DISPOSABLE) ×6 IMPLANT
HANDLE SUCTION POOLE (INSTRUMENTS) ×1 IMPLANT
INST SET MAJOR GENERAL (KITS) ×2 IMPLANT
KIT REMOVER STAPLE SKIN (MISCELLANEOUS) IMPLANT
KIT TURNOVER KIT A (KITS) ×2 IMPLANT
LIGASURE IMPACT 36 18CM CVD LR (INSTRUMENTS) ×2 IMPLANT
MANIFOLD NEPTUNE II (INSTRUMENTS) ×2 IMPLANT
NEEDLE HYPO 18GX1.5 BLUNT FILL (NEEDLE) ×2 IMPLANT
NEEDLE HYPO 21X1.5 SAFETY (NEEDLE) ×2 IMPLANT
NS IRRIG 1000ML POUR BTL (IV SOLUTION) ×12 IMPLANT
PACK ABDOMINAL MAJOR (CUSTOM PROCEDURE TRAY) ×2 IMPLANT
PAD ARMBOARD 7.5X6 YLW CONV (MISCELLANEOUS) ×2 IMPLANT
POUCH OSTOMY 2 3/4  H 3804 (WOUND CARE) ×1
POUCH OSTOMY 2 PC DRNBL 2.75 (WOUND CARE) ×1 IMPLANT
RELOAD LINEAR CUT PROX 55 BLUE (ENDOMECHANICALS) IMPLANT
RELOAD PROXIMATE 75MM BLUE (ENDOMECHANICALS) ×6 IMPLANT
RETRACTOR WND ALEXIS 25 LRG (MISCELLANEOUS) ×1 IMPLANT
RTRCTR WOUND ALEXIS 18CM MED (MISCELLANEOUS)
RTRCTR WOUND ALEXIS 25CM LRG (MISCELLANEOUS) ×2
SET BASIN LINEN APH (SET/KITS/TRAYS/PACK) ×2 IMPLANT
SPONGE LAP 18X18 RF (DISPOSABLE) ×8 IMPLANT
STAPLER GUN LINEAR PROX 60 (STAPLE) IMPLANT
STAPLER PROXIMATE 55 BLUE (STAPLE) IMPLANT
STAPLER PROXIMATE 75MM BLUE (STAPLE) ×4 IMPLANT
STAPLER VISISTAT (STAPLE) ×2 IMPLANT
SUCTION POOLE HANDLE (INSTRUMENTS) ×2
SUT CHROMIC 0 SH (SUTURE) IMPLANT
SUT CHROMIC 2 0 SH (SUTURE) IMPLANT
SUT CHROMIC 3 0 SH 27 (SUTURE) ×4 IMPLANT
SUT PDS AB CT VIOLET #0 27IN (SUTURE) ×6 IMPLANT
SUT PROLENE 2 0 SH 30 (SUTURE) ×2 IMPLANT
SUT SILK 2 0 (SUTURE) ×1
SUT SILK 2 0 SH (SUTURE) ×2 IMPLANT
SUT SILK 2-0 18XBRD TIE 12 (SUTURE) ×1 IMPLANT
SUT SILK 3 0 SH CR/8 (SUTURE) IMPLANT
SYR 20CC LL (SYRINGE) ×2 IMPLANT
TRAY FOLEY W/BAG SLVR 16FR (SET/KITS/TRAYS/PACK) ×1
TRAY FOLEY W/BAG SLVR 16FR ST (SET/KITS/TRAYS/PACK) ×1 IMPLANT

## 2018-11-12 NOTE — ED Provider Notes (Addendum)
[facility] EMERGENCY DEPARTMENT Provider Note   CSN: [id number] Arrival date & time: 11/12/18  1137     History   Chief Complaint Chief Complaint  Patient presents with  . Abdominal Pain    HPI [name] is a 78 y.o. female.  HPI  78 year old female, she has a known history of COPD, chronic kidney disease, elevated liver function test, HIV secondary to multiple blood transfusions, states that she has peripheral neuropathy and unfortunately has struggled with some dysuria over the last month which is been previously diagnosed by her family doctor to be a urinary tract infection according to the patient.  She reports initially treated with Keflex then a second antibiotic which she cannot name however she has had recurrent symptoms of dysuria frequency and lower abdominal discomfort despite taking these medications after initial improvement.  Over the last 24 hours she has had a recurrence in her dysuria which she states is like urinating glass and razor blades, she also complains of more intense left lower quadrant pain than she has had previously but reports this left lower quadrant pain is been present for the last month but much more intense today.  She did have some lunch prior to coming in but vomited 3 times in route to because of increasing pain.  Subjective fevers, no coughing shortness of breath chest pain headache blurred vision or other symptoms.  Past Medical History:  Diagnosis Date  . Anxiety   . Arthritis   . Bronchitis   . CKD (chronic kidney disease) stage 3, GFR 30-59 ml/min (HCC) 05/04/2016  . COPD (chronic obstructive pulmonary disease) ([place])   . Depression   . Elevated LFTs 05/04/2016  . History of TIAs 1981   left side weakness  . HIV (human immunodeficiency virus infection) ([place])   . Hypertension   . Kidney disease related to HIV infection [facility])    pt states she has to have her creatinine level checked often   . Nausea 03/03/2017  . Peripheral neuropathy     bilateral feet  . Sinusitis 05/06/2018  . UTI (urinary tract infection) 03/03/2017  . Weight loss 03/03/2017    Patient Active Problem List   Diagnosis Date Noted  . Sinusitis 05/06/2018  . COPD (chronic obstructive pulmonary disease) ([place]) 12/17/2017  . Acute encephalopathy 12/17/2017  . Hypothyroidism 12/17/2017  . 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 Acute bronchitis 03/30/2017  . UTI (urinary tract infection) 03/03/2017  . Nausea 03/03/2017  . Weight loss 03/03/2017  . Fever 07/22/2016  . CAP (community acquired pneumonia) 07/22/2016  . CKD (chronic kidney disease) stage 3, GFR 30-59 ml/min (HCC) 05/04/2016  . Elevated LFTs 05/04/2016  . 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 Breath shortness 06/19/2014  . Syncope 05/31/2014  . Rib fracture 05/31/2014  . Syncope and collapse 05/31/2014  . Weight gain 03/21/2014  . Cervical spondylosis 08/15/2013  . Neck pain on left side 05/24/2013  . Unspecified vitamin D deficiency 12/14/2012  . Vitamin D toxicity 12/14/2012  . Hemorrhoid 09/01/2012  . Myalgia 02/25/2012  . HTN (hypertension) 11/25/2011  . Cramps, muscle, general 08/18/2011  . RECTAL BLEEDING 11/03/2010  . DYSPHAGIA 11/03/2010  . FATIGUE 08/29/2010  . MUSCLE PAIN 06/04/2010  . Acute cystitis 05/12/2010  . RENAL INSUFFICIENCY 12/18/2008  . DEPRESSION 09/04/2008  . IRRITABLE BOWEL SYNDROME 05/22/2008  . HERNIATED CERVICAL DISC 10/05/2007  . HERNIATED LUMBAR DISC 10/05/2007  . History of urinary tract infection 09/20/2007  . HIV-1 associated autonomic neuropathy ([place]) 03/16/2007  .  ELBOW PAIN 03/16/2007  . ISCHEMIC COLITIS 12/27/2006  . RENAL FAILURE NOS 12/27/2006  . Human immunodeficiency virus (HIV) disease ([place]) 08/27/2006    Past Surgical History:  Procedure Laterality Date  . ABDOMINAL HYSTERECTOMY  1985  . BACK SURGERY    . CATARACT EXTRACTION, BILATERAL    . CHOLECYSTECTOMY    . COLONOSCOPY  08/10/2002   NUR: Normal colonoscopy  except for some hemorrhoids and some erythema at the dentate line  . COLONOSCOPY  12/10/2006   Edwards:Diffuse colitis in the transverse and descending colon/This is not entirely typical of ischemic colitis or her HIV positive/ disease.  We need to be concerned about other causes  . COLONOSCOPY  01/19/2011   RMR: Internal and external hemorrhoids, likely source of hematochezia anal papilla, otherwise normal rectal mucosa/ Left-sided diverticula.  Cecal polyp with status post cold snare polypectomy.  Remainder of colonic mucosa appeared normal  . ESOPHAGOGASTRODUODENOSCOPY  08/10/2002     NUR: Mild changes of reflux esophagitis limited to gastroesophageal junction  No evidence of ring, stricture, or esophageal candidiasis dysphagia/ Gastritis, possibly H. pylori induced  . ESOPHAGOGASTRODUODENOSCOPY  12/25/2002   NUR: Erosive antral gastritis.  The degree of gastritis is more significant than her last exam/ Normal examination of the esophagus/ Esophageal dilatation performed by passing 33 and [address] [place]  . ESOPHAGOGASTRODUODENOSCOPY  11/13/2010   RMR: Circumferential distal esophageal erosions with soft peptic stricture, consistent with erosive reflux esophagitis or stricture  formation, status post dilation as described above/  Small hiatal hernia/ Tiny antral erosions, otherwise normal stomach D1 and D2  . HEMORRHOID SURGERY  09/02/2012   Procedure: HEMORRHOIDECTOMY;  Surgeon: Jamesetta So, MD;  Location: [facility];  Service: General;  Laterality: N/A;  . TUBAL LIGATION       OB History    Gravida  4   Para  3   Term  2   Preterm  1   AB  1   Living  3     SAB  1   TAB      Ectopic      Multiple      Live Births               Home Medications    Prior to Admission medications   Medication Sig Start Date End Date Taking? Authorizing Provider  albuterol (PROVENTIL HFA;VENTOLIN HFA) 108 (90 Base) MCG/ACT inhaler Inhale 1-2 puffs into the lungs every 6 (six) hours  as needed for wheezing or shortness of breath. 04/02/17   Nat Christen, MD  amitriptyline (ELAVIL) 50 MG tablet Take 50 mg by mouth at bedtime.     [provider]  cephALEXin (KEFLEX) 500 MG capsule Take 1 capsule (500 mg total) by mouth 3 (three) times daily. 08/29/18   Julianne Rice, MD  Cholecalciferol (VITAMIN D3) 10000 units TABS Take 1 tablet by mouth daily.    [provider]  diphenhydrAMINE (BENADRYL) 25 mg capsule Take 25 mg by mouth daily as needed for itching (itching from hydrocodone).    [provider]  fluconazole (DIFLUCAN) 200 MG tablet Take 1 tablet (200 mg total) by mouth daily. 08/29/18   Julianne Rice, MD  fluticasone (FLONASE) 50 MCG/ACT nasal spray Place 2 sprays into both nostrils at bedtime. 05/06/18   Truman Hayward, MD  furosemide (LASIX) 40 MG tablet Take 40 mg by mouth daily as needed for fluid.     [provider]  guaiFENesin (MUCINEX) 600 MG 12 hr  tablet Take 1 tablet (600 mg total) by mouth 2 (two) times daily. Patient not taking: Reported on 12/17/2017 03/30/17   Kathie Dike, MD  hydrALAZINE (APRESOLINE) 25 MG tablet Take 1 tablet (25 mg total) by mouth every 8 (eight) hours. Patient not taking: Reported on 04/04/2018 12/19/17   Kathie Dike, MD  hydrOXYzine (ATARAX/VISTARIL) 25 MG tablet Take 1 tablet (25 mg total) by mouth 3 (three) times daily as needed. Patient not taking: Reported on 04/04/2018 12/19/17   Kathie Dike, MD  ipratropium-albuterol (DUONEB) 0.5-2.5 (3) MG/3ML SOLN Inhale 3 mLs into the lungs every 6 (six) hours as needed (shortness of breath).  04/02/17   [provider]  LORazepam (ATIVAN) 1 MG tablet Take 1 mg by mouth 2 (two) times daily. 03/25/18   [provider]  Multiple Vitamin (ONE-A-DAY 55 PLUS PO) Take 1 tablet by mouth daily.    [provider]  nebivolol (BYSTOLIC) 10 MG tablet Take 10 mg by mouth daily.     [provider]  ondansetron (ZOFRAN) 8 MG  tablet Take 8 mg by mouth daily as needed for nausea or vomiting.     [provider]  Oxycodone HCl 10 MG TABS Take 10 mg by mouth 4 (four) times daily as needed. For pain 03/25/17   [provider]  phenazopyridine (PYRIDIUM) 200 MG tablet Take 1 tablet (200 mg total) by mouth 3 (three) times daily as needed for pain. 08/28/18   Julianne Rice, MD  polyvinyl alcohol (LIQUIFILM TEARS) 1.4 % ophthalmic solution Place 1 drop into both eyes as needed for dry eyes.    [provider]  thiamine (VITAMIN B-1) 100 MG tablet Take 100 mg by mouth daily.    [provider]  TRIUMEQ 600-50-300 MG tablet TAKE 1 TABLET ONCE DAILY. 09/30/18   Truman Hayward, MD    Family History Family History  Problem Relation Age of Onset  . Diabetes Brother     Social History Social History   Tobacco Use  . Smoking status: Never Smoker  . Smokeless tobacco: Never Used  Substance Use Topics  . Alcohol use: No  . Drug use: No     Allergies   Bee venom; Tenofovir disoproxil fumarate [tenofovir disoproxil fumarate]; Compazine  [prochlorperazine edisylate]; Haloperidol lactate; Codeine; Navane [thiothixene]; Prednisone; Prochlorperazine; Promethazine hcl; Propoxyphene n-acetaminophen; and Morphine   Review of Systems Review of Systems  All other systems reviewed and are negative.    Physical Exam Updated Vital Signs BP (!) 154/60   Pulse 64   Temp 98.3 F (36.8 C) (Oral)   Resp 20   Ht 1.626 m (5\' 4" )   Wt 83.5 kg   SpO2 98%   BMI 31.58 kg/m   Physical Exam Vitals signs and nursing note reviewed.  Constitutional:      General: She is not in acute distress.    Appearance: She is well-developed.  HENT:     Head: Normocephalic and atraumatic.     Mouth/Throat:     Pharynx: No oropharyngeal exudate.  Eyes:     General: No scleral icterus.       Right eye: No discharge.        Left eye: No discharge.     Conjunctiva/sclera: Conjunctivae normal.      Pupils: Pupils are equal, round, and reactive to light.  Neck:     Musculoskeletal: Normal range of motion and neck supple.     Thyroid: No thyromegaly.     Vascular:  No JVD.  Cardiovascular:     Rate and Rhythm: Normal rate and regular rhythm.     Heart sounds: Normal heart sounds. No murmur. No friction rub. No gallop.   Pulmonary:     Effort: Pulmonary effort is normal. No respiratory distress.     Breath sounds: Normal breath sounds. No wheezing or rales.  Abdominal:     General: Bowel sounds are normal. There is no distension.     Palpations: Abdomen is soft. There is no mass.     Tenderness: There is abdominal tenderness.     Comments: Tenderness present throughout the abdomen but most focused in the left mid and left lower quadrant with mild guarding, no other abdominal guarding, no peritoneal signs  Musculoskeletal: Normal range of motion.        General: No tenderness.  Lymphadenopathy:     Cervical: No cervical adenopathy.  Skin:    General: Skin is warm and dry.     Findings: No erythema or rash.  Neurological:     Mental Status: She is alert.     Coordination: Coordination normal.  Psychiatric:        Behavior: Behavior normal.      ED Treatments / Results  Labs (all labs ordered are listed, but only abnormal results are displayed) Labs Reviewed  URINALYSIS, ROUTINE W REFLEX MICROSCOPIC - Abnormal; Notable for the following components:      Result Value   Color, Urine AMBER (*)    APPearance CLOUDY (*)    Ketones, ur 5 (*)    Protein, ur 100 (*)    Nitrite POSITIVE (*)    Leukocytes, UA TRACE (*)    WBC, UA >50 (*)    All other components within normal limits  CBC WITH DIFFERENTIAL/PLATELET - Abnormal; Notable for the following components:   MCV 101.0 (*)    All other components within normal limits  COMPREHENSIVE METABOLIC PANEL - Abnormal; Notable for the following components:   Glucose, Bld 157 (*)    Creatinine, Ser 1.78 (*)    Total Protein 6.4 (*)     Total Bilirubin 1.3 (*)    GFR calc non Af Amer 27 (*)    GFR calc Af Amer 31 (*)    All other components within normal limits  URINE CULTURE  LIPASE, BLOOD    EKG EKG Interpretation  Date/Time:  Saturday November 12 2018 15:12:40 EST Ventricular Rate:  109 PR Interval:    QRS Duration: 96 QT Interval:  334 QTC Calculation: 450 R Axis:   27 Text Interpretation:  Sinus tachycardia Low voltage, precordial leads Since last tracing rate faster Confirmed by Noemi Chapel [phone]) on 11/12/2018 4:45:08 PM   Radiology Ct Abdomen Pelvis Wo Contrast  Result Date: 11/12/2018 CLINICAL DATA:  Abdominal pain with nausea and vomiting. EXAM: CT ABDOMEN AND PELVIS WITHOUT CONTRAST TECHNIQUE: Multidetector CT imaging of the abdomen and pelvis was performed following the standard protocol without IV contrast. COMPARISON:  May 31, 2014 FINDINGS: Lower chest: Coronary artery calcifications. Small hiatal hernia. Lower chest otherwise unremarkable. Hepatobiliary: Cholecystectomy. Evaluation of the liver is limited without contrast but no obvious liver masses are noted. Pancreas: Unremarkable. No pancreatic ductal dilatation or surrounding inflammatory changes. Spleen: Normal in size without focal abnormality. Adrenals/Urinary Tract: There is a fat containing nodule in the left adrenal gland consistent with a myelolipoma, unchanged since August 2015 measuring 2 cm. Right adrenal gland is normal. Kidneys and ureters are normal. The bladder is thick walled with  fat stranding adjacent to the bladder. Stomach/Bowel: There is a small hiatal hernia. The stomach is otherwise normal. The small bowel is unremarkable. The colon is markedly abnormal. There is a stool ball in the distal transverse colon with adjacent colonic wall thickening. There is suggested pneumatosis in the wall of the colon in this location. Wall thickening appears to extend into the more proximal distal half of the transverse colon. There is free air  and fat stranding surrounding the colon containing the stool ball. The proximal descending colon appears inflamed which may be secondary to the overall process. Colonic diverticuli are seen throughout the descending and sigmoid colon. The ascending and proximal transverse colon are normal. The appendix is not visualized but there is no secondary evidence of appendicitis. Vascular/Lymphatic: Minimal atherosclerotic changes in the abdominal aorta. No adenopathy. Reproductive: Status post hysterectomy. No adnexal masses. Other: There is some free fluid in the pelvis, likely reactive to either the bladder or colonic pathology. Free air in the abdomen is identified, relatively diffuse in distribution. However, the largest region of free air is adjacent to the abnormal distal transverse colon suggesting this is the location of perforation. No obvious portal venous gas. Musculoskeletal: No acute or significant osseous findings. IMPRESSION: 1. Free air throughout the abdomen consistent with bowel perforation. The largest region of free air is adjacent to the abnormal transverse colon which contains wall thickening and a stool ball. The wall thickening extends over several cm into the distal half of the transverse colon. The proximal descending colon appears inflamed as well which may be secondary to the transverse colon abnormality. I suspect the distal transverse colon in the region of the stool ball is the location of perforation and there is suggested pneumatosis in the wall of the colon in this location. The findings could be due to an infectious, inflammatory, or vascular colitis/process. Neoplasm/malignancy is not excluded. 2. Thick-walled bladder with adjacent fat stranding suggesting cystitis. Recommend correlation with urinalysis. 3. Left adrenal myelolipoma. 4. Colonic diverticulosis. Findings called to Dr. Thurnell Garbe. Electronically Signed   By: Dorise Bullion III M.D   On: 11/12/2018 14:47     Procedures .Critical Care Performed by: Noemi Chapel, MD Authorized by: Noemi Chapel, MD   Critical care provider statement:    Critical care time (minutes):  35   Critical care time was exclusive of:  Separately billable procedures and treating other patients and teaching time   Critical care was necessary to treat or prevent imminent or life-threatening deterioration of the following conditions: perforated intestines.   Critical care was time spent personally by me on the following activities:  Blood draw for specimens, development of treatment plan with patient or surrogate, discussions with consultants, evaluation of patient's response to treatment, examination of patient, obtaining history from patient or surrogate, ordering and performing treatments and interventions, ordering and review of laboratory studies, ordering and review of radiographic studies, pulse oximetry, re-evaluation of patient's condition and review of old charts   (including critical care time)  Medications Ordered in ED Medications  sodium chloride 0.9 % bolus 1,000 mL (0 mLs Intravenous Stopped 11/12/18 1437)    Followed by  0.9 %  sodium chloride infusion (1,000 mLs Intravenous New Bag/Given 11/12/18 1439)  ceFEPIme (MAXIPIME) 2 g in sodium chloride 0.9 % 100 mL IVPB (2 g Intravenous New Bag/Given 11/12/18 1439)    And  metroNIDAZOLE (FLAGYL) IVPB 500 mg (has no administration in time range)  fentaNYL (SUBLIMAZE) injection 50 mcg (50 mcg Intravenous Given 11/12/18  1248)  ondansetron (ZOFRAN) injection 4 mg (4 mg Intravenous Given 11/12/18 1248)  HYDROmorphone (DILAUDID) injection 1 mg (1 mg Intravenous Given 11/12/18 1436)  ondansetron (ZOFRAN) injection 4 mg (4 mg Intravenous Given 11/12/18 1440)     Initial Impression / Assessment and Plan / ED Course  I have reviewed the triage vital signs and the nursing notes.  Pertinent labs & imaging results that were available during my care of the patient were  reviewed by me and considered in my medical decision making (see chart for details).     On exam the patient is actually not ill-appearing however she does appear uncomfortable especially with palpation of the abdomen which makes a simple urinary tract infection much less likely is something like diverticulitis or other source of internal pathology.  Given her recurrent symptoms it suggests something else going on, will look for kidney stone, diverticulitis or complications of same.  Patient agreeable to the plan.  The patient was reexamined, she now has a diffusely peritoneal abdomen, she has been persistently vomiting for the last 30 minutes despite Zofran and fentanyl, I have personally looked at her x-ray as well as her lab work.  Her creatinine is 1.78 which seems to be her baseline compared to prior labs, there is no leukocytosis or anemia thankfully however I do believe that her CT scan shows some significant inflammation bowel wall thickening and a possible perforation of her viscus.  Will discuss with general surgery, start antibiotics and you stronger nausea and pain medications.  Confirmed the CT results with radiology - pt has perforated colon likely from large stool ball, has free air, pneumatosis and cystitis, Antibiotics given, Dr. Constance Haw consulted for surgical expertise.  Lactic acid elevated at 3.7, the patient has become more tachycardic, and preop EKG is unremarkable.  Dr. Nehemiah Settle has been wiling to consult from medical service to assist with medicines and management of chronic medical problems.  Final Clinical Impressions(s) / ED Diagnoses   Final diagnoses:  Perforated bowel ([place])  Acute cystitis without hematuria      Noemi Chapel, MD 11/12/18 1503    Noemi Chapel, MD 11/12/18 [phone]

## 2018-11-12 NOTE — Transfer of Care (Signed)
Immediate Anesthesia Transfer of Care Note  Patient: [name]  Procedure(s) Performed: EXPLORATORY LAPAROTOMY,  colectomy, colostomy (N/A )  Patient Location: ICU  Anesthesia Type:General  Level of Consciousness: awake, alert  and oriented  Airway & Oxygen Therapy: Patient Spontanous Breathing and Patient connected to nasal cannula oxygen  Post-op Assessment: Report given to RN  Post vital signs: Reviewed  Last Vitals:  Vitals Value Taken Time  BP    Temp    Pulse 104 11/12/2018  7:38 PM  Resp 31 11/12/2018  7:38 PM  SpO2 96 % 11/12/2018  7:38 PM  Vitals shown include unvalidated device data.  Last Pain:  Vitals:   11/12/18 1420  TempSrc:   PainSc: 10-Worst pain ever         Complications: No apparent anesthesia complications

## 2018-11-12 NOTE — Progress Notes (Signed)
[facility]  Post op BP trending down. 70/40, 60/30, will go ahead and put in central line so that pressors can be given.   PRN for pain Awake and oriented  BP trending down, CVL to be placed, will order levophed  NPO, NG in place Labs in the AM Zosyn for fecal peritonitis  Updated Dr. Nehemiah Settle.  Curlene Labrum, MD [facility] [address] [place],  [id number] [phone] (office)

## 2018-11-12 NOTE — Anesthesia Procedure Notes (Signed)
Procedure Name: Intubation Date/Time: 11/12/2018 5:19 PM Performed by: Lenice Llamas, MD Pre-anesthesia Checklist: Patient identified, Patient being monitored, Timeout performed, Emergency Drugs available and Suction available Patient Re-evaluated:Patient Re-evaluated prior to induction Oxygen Delivery Method: Circle System Utilized Preoxygenation: Pre-oxygenation with 100% oxygen Induction Type: IV induction and Rapid sequence Ventilation: Mask ventilation without difficulty Laryngoscope Size: Mac and 4 Grade View: Grade I Tube type: Oral Tube size: 7.0 mm Number of attempts: 1 Airway Equipment and Method: Stylet Placement Confirmation: ETT inserted through vocal cords under direct vision,  positive ETCO2 and breath sounds checked- equal and bilateral Secured at: 20 cm Tube secured with: Tape Dental Injury: Teeth and Oropharynx as per pre-operative assessment

## 2018-11-12 NOTE — Progress Notes (Signed)
Patient transported on monitor and 15 liters of O2.  In stable condition per Dr. Loretha Stapler.  Patient awake and alert.  Denies discomfort during transport.  No acute distress noted.  Report was given to Tilden Fossa, RN per Lattie Haw, RN.

## 2018-11-12 NOTE — Op Note (Signed)
[facility] Operative Note  11/12/18  Preoperative Diagnosis:  Pneumoperitoneum    Postoperative Diagnosis: Stercoral ulcer with perforation, fecal peritonitis    Procedure(s) Performed: Exploratory laparotomy, descending colon resection, end colostomy   Surgeon: Lanell Matar. Constance Haw, MD   Assistants: No qualified resident was available    Anesthesia: General endotracheal   Anesthesiologist: Lenice Llamas, MD    Specimens:  Descending colon (suture marks proximal), additional proximal margin (suture marks proximal)    Estimated Blood Loss: 50cc   Blood Replacement: None    Complications: None   Wound Class: Dirty/ Infected    Operative Indications:  Ms. Lisle is a 78 yo with acute onset of abdominal pain and pneumoperitoneum on CT scan that presented to the hospital today. After a discussion with the patient and family about the risk and benefits of surgery including but not limited to bleeding, infection, need for an ostomy, possibility of a cancer, possible injury to another organ, and possibility of ICU and continued ventilation, the patient opted to proceed with surgery.    Findings: Splenic flexure with stercoral ulcer; large stool ball, no obvious mass    Procedure: The patient was taken to the operating room and placed supine. General endotracheal anesthesia was induced. Intravenous antibiotics were administered per protocol.  An orogastric tube positioned to decompress the stomach. The abdomen was prepared and draped in the usual sterile fashion.   A midline incision was made and carried down through the fascia with electrocautery. The abdomen was entered with care using Metzenbaum scissors. There was brown murky ascites noted and signs of fecal peritonitis on the peritoneum and the bowel.  The abdomen was explored and a large stool ball was noted in the splenic flexure. The patient had a very redundant colon and a very redundant transverse colon.  No  masses were noted on the liver or the remaining colon. The small bowel was packed off in the right upper quadrant.  The The white line of [place] was taken down on the left, and the retroperitoneum was swept down ensuring that the ureter was protected.  This was carried up to the splenic flexure where some omental attachments held the colon in place.  A healthy portion of bowel on the descending colon was transected with a 75 mm linear cutting stapler.  The transverse colon was grasped and the omentum was dissected off the transverse colon with cautery,and the lesser sac was entered. The Ligasure was used to takedown the splenic flexure omental attachments to the colon.  The colon was swept down with blunt dissection, and care was taken to ensure that the retroperitoneal structures remained down.  The transverse colon was then transected with a 75 mm linear cutting stapler, and the Ligasure was used to transect the mesentery.  The left ureter was identified below our dissection plane and protected. The specimen was taken off the field and suture marked proximal   The abdomen was irrigated with copious amounts of saline.  The transverse colon appeared a little dusky and an additional proximal margin was taken with a 75 mm linear cutting stapler and the mesentery was taken with the Ligasure (suture marked proximal).  The remaining colon was pink and viable.  The omentum was dissected of the transverse colon to allow for further mobilization to the left side of the abdomen.  After freeing up the transverse colon from the open, there was sufficient length of colon to reach the left upper quadrant.  A disk of skin  was removed in the left upper quadrant. The subcutaneous tissue was divided and a cruciate incision was made in the muscle with the electrocautery. Two fingerbreadth's of dilation were ensured, and the colon was brought through the defect with an Alis clamp.  There was no tension and no twisting of the colon.    The fascia was closed with 0 PDS suture in the standard fashion.  Attention was then turned to the colostomy. The staple line was excised and 3-0 chromic gut suture was used to mature the ostomy. The colostomy bag was placed on the ostomy. The ostomy was digitized easily.   The skin was left open and a few staples were used to approximate the umbilicus and make two smaller wounds for packing. Damp kerlix was packed into the wounds and an ABD and paper tape was applied.   The specimen was opened off the field and no masses were found. A large stool ball had caused the perforation consistent with a stercoral ulcer.   Final inspection revealed acceptable hemostasis. All counts were correct at the end of the case. The patient was awakened from anesthesia and extubate without complication.  The patient went to the ICU in stable condition given her age and co-morbidities, so that she can be further monitored.     Curlene Labrum, MD [facility] [address] [place], [place] [id number] [phone] (office)

## 2018-11-12 NOTE — Consult Note (Signed)
Medical Consultation   [name]  EXN:[id number]  DOB: 08/31/1941  DOA: 11/12/2018  PCP: Octavio Graves, DO   Requesting physician: Dr Constance Haw  Reason for consultation: Medical Management of surgical patient   History of Present Illness: [name] is an 78 y.o. female with a history of HIV following a blood transfusion, hypertension, chronic kidney disease stage III, COPD, history of TIAs, recurrent UTI, chronic pain, peripheral neuropathy in her feet bilaterally.  Patient presents to the hospital with intermittent left-sided nonradiating cramping abdominal pain that became constant around 10 AM.  The pain has been worsening.  No palliating or provoking factors.  Patient became nauseated and had multiple episodes of bilious emesis in route to the hospital.  She does admit to constipation that is chronic.  CT scan here shows perforation of her transverse colon due to an obstruction.  Patient started on cefepime and Flagyl.  The hospitalist team was asked to consult to provide medical support for her chronic medical problems.  The patient is also had recurrent UTIs.  She is largely incontinent and wears depends, which she changes 5-6 times a day.  She complains of dysuria, frequency, foul-smelling odor.  Antibiotics improve the dysuria, but it returns immediately upon finishing the course.  Last culture in our system is in 08/2018 which showed E. coli with pan sensitivities.    Review of Systems:  ROS Denies fevers, chills, shortness of breath, wheezing, coughing, chest pain, melena, rectal bleeding. As per HPI otherwise 10 point review of systems negative.     Past Medical History: Past Medical History:  Diagnosis Date  . Anxiety   . Arthritis   . Bronchitis   . CKD (chronic kidney disease) stage 3, GFR 30-59 ml/min (HCC) 05/04/2016  . COPD (chronic obstructive pulmonary disease) ([place])   . Depression   . Elevated LFTs 05/04/2016  . History of TIAs 1981   left side weakness  . HIV (human immunodeficiency virus infection) ([place])   . Hypertension   . Kidney disease related to HIV infection [facility])    pt states she has to have her creatinine level checked often   . Nausea 03/03/2017  . Peripheral neuropathy    bilateral feet  . Sinusitis 05/06/2018  . UTI (urinary tract infection) 03/03/2017  . Weight loss 03/03/2017    Past Surgical History: Past Surgical History:  Procedure Laterality Date  . ABDOMINAL HYSTERECTOMY  1985  . BACK SURGERY    . CATARACT EXTRACTION, BILATERAL    . CHOLECYSTECTOMY    . COLONOSCOPY  08/10/2002   NUR: Normal colonoscopy except for some hemorrhoids and some erythema at the dentate line  . COLONOSCOPY  12/10/2006   Edwards:Diffuse colitis in the transverse and descending colon/This is not entirely typical of ischemic colitis or her HIV positive/ disease.  We need to be concerned about other causes  . COLONOSCOPY  01/19/2011   RMR: Internal and external hemorrhoids, likely source of hematochezia anal papilla, otherwise normal rectal mucosa/ Left-sided diverticula.  Cecal polyp with status post cold snare polypectomy.  Remainder of colonic mucosa appeared normal  . ESOPHAGOGASTRODUODENOSCOPY  08/10/2002     NUR: Mild changes of reflux esophagitis limited to gastroesophageal junction  No evidence of ring, stricture, or esophageal candidiasis dysphagia/ Gastritis, possibly H. pylori induced  . ESOPHAGOGASTRODUODENOSCOPY  12/25/2002   NUR: Erosive antral gastritis.  The degree of gastritis is more significant than her last  exam/ Normal examination of the esophagus/ Esophageal dilatation performed by passing 15 and [address] [place]  . ESOPHAGOGASTRODUODENOSCOPY  11/13/2010   RMR: Circumferential distal esophageal erosions with soft peptic stricture, consistent with erosive reflux esophagitis or stricture  formation, status post dilation as described above/  Small hiatal hernia/ Tiny antral erosions, otherwise normal stomach D1  and D2  . HEMORRHOID SURGERY  09/02/2012   Procedure: HEMORRHOIDECTOMY;  Surgeon: Jamesetta So, MD;  Location: [facility];  Service: General;  Laterality: N/A;  . TUBAL LIGATION       Allergies:   Allergies  Allergen Reactions  . Bee Venom Anaphylaxis  . Tenofovir Disoproxil Fumarate [Tenofovir Disoproxil Fumarate] Other (See Comments)    Renal failure, (08/27/2006)  . Compazine  [Prochlorperazine Edisylate] Nausea And Vomiting  . Haloperidol Lactate Other (See Comments)    Reaction is muscle tension, causes severe spasms in face and neck. Forces eyes to roll in the back of the head  . Codeine Itching and Nausea And Vomiting  . Navane [Thiothixene] Other (See Comments) and Nausea And Vomiting    Same muscle spasm reaction as haldol  . Prednisone     Brief mild psychosis and agitation  . Prochlorperazine Nausea And Vomiting  . Promethazine Hcl Other (See Comments)    Same as haldol   . Propoxyphene N-Acetaminophen Itching and Nausea And Vomiting  . Morphine Itching and Swelling     Social History:  reports that she has never smoked. She has never used smokeless tobacco. She reports that she does not drink alcohol or use drugs.   Family History: Family History  Problem Relation Age of Onset  . Diabetes Brother     Unacceptable: Noncontributory, unremarkable, or negative. Acceptable: Family history reviewed and not pertinent (If you reviewed it)   Physical Exam: Vitals:   11/12/18 1500 11/12/18 1515 11/12/18 1532 11/12/18 1553  BP: (!) 153/56  (!) 153/64   Pulse:   (!) 105 (!) 105  Resp:  (!) 23 (!) 23 20  Temp:      TempSrc:      SpO2:    96%  Weight:      Height:        Constitutional: Elderly Caucasian female,  Alert and awake, oriented x3, not in any acute distress.  She appears to be comfortable in the bed Eyes: PERLA, EOMI, irises appear normal, anicteric sclera,  ENMT: external ears and nose appear normal. Lips appears normal, oropharynx mucosa, tongue,  posterior pharynx appear normal  Neck: neck appears normal, no masses, normal ROM, no thyromegaly, no JVD.  No carotid bruit. CVS: S1-S2 clear, no murmur rubs or gallops, no LE edema, normal pedal pulses  Respiratory:  clear to auscultation bilaterally, no wheezing, rales or rhonchi. Respiratory effort normal. No accessory muscle use.  Abdomen: soft, tender throughout with guarding and rebound tenderness.  Mildly distended.  Absent bowel sounds, no hepatosplenomegaly, no hernias  Musculoskeletal: : no cyanosis, clubbing or edema noted bilaterally.  No contractures or atrophy. Neuro: Cranial nerves II-XII intact, strength, sensation, reflexes intact Psych: judgement and insight appear normal, stable mood and affect, mental status Skin: no rashes or lesions or ulcers, no induration or nodules   Data reviewed:  I have personally reviewed following labs and imaging studies Labs:  CBC: Recent Labs  Lab 11/12/18 1240  WBC 8.7  NEUTROABS 4.3  HGB 14.1  HCT 42.2  MCV 101.0*  PLT 161    Basic Metabolic Panel: Recent Labs  Lab 11/12/18  1240  NA 139  K 4.0  CL 107  CO2 23  GLUCOSE 157*  BUN 18  CREATININE 1.78*  CALCIUM 10.0   GFR Estimated Creatinine Clearance: 27.7 mL/min (A) (by C-G formula based on SCr of 1.78 mg/dL (H)). Liver Function Tests: Recent Labs  Lab 11/12/18 1240  AST 24  ALT 20  ALKPHOS 64  BILITOT 1.3*  PROT 6.4*  ALBUMIN 4.1   Recent Labs  Lab 11/12/18 1240  LIPASE 24   No results for input(s): AMMONIA in the last 168 hours. Coagulation profile No results for input(s): INR, PROTIME in the last 168 hours.  Cardiac Enzymes: No results for input(s): CKTOTAL, CKMB, CKMBINDEX, TROPONINI in the last 168 hours. BNP: Invalid input(s): POCBNP CBG: No results for input(s): GLUCAP in the last 168 hours. D-Dimer No results for input(s): DDIMER in the last 72 hours. Hgb A1c No results for input(s): HGBA1C in the last 72 hours. Lipid Profile No results  for input(s): CHOL, HDL, LDLCALC, TRIG, CHOLHDL, LDLDIRECT in the last 72 hours. Thyroid function studies No results for input(s): TSH, T4TOTAL, T3FREE, THYROIDAB in the last 72 hours.  Invalid input(s): FREET3 Anemia work up No results for input(s): VITAMINB12, FOLATE, FERRITIN, TIBC, IRON, RETICCTPCT in the last 72 hours. Urinalysis    Component Value Date/Time   COLORURINE AMBER (A) 11/12/2018 1223   APPEARANCEUR CLOUDY (A) 11/12/2018 1223   LABSPEC 1.015 11/12/2018 1223   PHURINE 5.0 11/12/2018 1223   GLUCOSEU NEGATIVE 11/12/2018 1223   GLUCOSEU NEG mg/dL 09/20/2007 1114   HGBUR NEGATIVE 11/12/2018 1223   HGBUR moderate 06/04/2010 1425   BILIRUBINUR NEGATIVE 11/12/2018 1223   KETONESUR 5 (A) 11/12/2018 1223   PROTEINUR 100 (A) 11/12/2018 1223   UROBILINOGEN 1.0 05/31/2014 0933   NITRITE POSITIVE (A) 11/12/2018 1223   LEUKOCYTESUR TRACE (A) 11/12/2018 1223     Microbiology No results found for this or any previous visit (from the past 240 hour(s)).     Inpatient Medications:   Scheduled Meds: Continuous Infusions: . sodium chloride 1,000 mL (11/12/18 1439)  . metronidazole 500 mg (11/12/18 1525)  . sodium chloride       Radiological Exams on Admission: Ct Abdomen Pelvis Wo Contrast  Result Date: 11/12/2018 CLINICAL DATA:  Abdominal pain with nausea and vomiting. EXAM: CT ABDOMEN AND PELVIS WITHOUT CONTRAST TECHNIQUE: Multidetector CT imaging of the abdomen and pelvis was performed following the standard protocol without IV contrast. COMPARISON:  May 31, 2014 FINDINGS: Lower chest: Coronary artery calcifications. Small hiatal hernia. Lower chest otherwise unremarkable. Hepatobiliary: Cholecystectomy. Evaluation of the liver is limited without contrast but no obvious liver masses are noted. Pancreas: Unremarkable. No pancreatic ductal dilatation or surrounding inflammatory changes. Spleen: Normal in size without focal abnormality. Adrenals/Urinary Tract: There is a fat  containing nodule in the left adrenal gland consistent with a myelolipoma, unchanged since August 2015 measuring 2 cm. Right adrenal gland is normal. Kidneys and ureters are normal. The bladder is thick walled with fat stranding adjacent to the bladder. Stomach/Bowel: There is a small hiatal hernia. The stomach is otherwise normal. The small bowel is unremarkable. The colon is markedly abnormal. There is a stool ball in the distal transverse colon with adjacent colonic wall thickening. There is suggested pneumatosis in the wall of the colon in this location. Wall thickening appears to extend into the more proximal distal half of the transverse colon. There is free air and fat stranding surrounding the colon containing the stool ball. The proximal descending colon appears  inflamed which may be secondary to the overall process. Colonic diverticuli are seen throughout the descending and sigmoid colon. The ascending and proximal transverse colon are normal. The appendix is not visualized but there is no secondary evidence of appendicitis. Vascular/Lymphatic: Minimal atherosclerotic changes in the abdominal aorta. No adenopathy. Reproductive: Status post hysterectomy. No adnexal masses. Other: There is some free fluid in the pelvis, likely reactive to either the bladder or colonic pathology. Free air in the abdomen is identified, relatively diffuse in distribution. However, the largest region of free air is adjacent to the abnormal distal transverse colon suggesting this is the location of perforation. No obvious portal venous gas. Musculoskeletal: No acute or significant osseous findings. IMPRESSION: 1. Free air throughout the abdomen consistent with bowel perforation. The largest region of free air is adjacent to the abnormal transverse colon which contains wall thickening and a stool ball. The wall thickening extends over several cm into the distal half of the transverse colon. The proximal descending colon appears  inflamed as well which may be secondary to the transverse colon abnormality. I suspect the distal transverse colon in the region of the stool ball is the location of perforation and there is suggested pneumatosis in the wall of the colon in this location. The findings could be due to an infectious, inflammatory, or vascular colitis/process. Neoplasm/malignancy is not excluded. 2. Thick-walled bladder with adjacent fat stranding suggesting cystitis. Recommend correlation with urinalysis. 3. Left adrenal myelolipoma. 4. Colonic diverticulosis. Findings called to Dr. Thurnell Garbe. Electronically Signed   By: Dorise Bullion III M.D   On: 11/12/2018 14:47    Impression/Recommendations Principal Problem:   Large bowel perforation ([place]) Active Problems:   Human immunodeficiency virus (HIV) disease ([place])   Acute cystitis   HTN (hypertension)   CKD (chronic kidney disease) stage 3, GFR 30-59 ml/min (HCC)   COPD (chronic obstructive pulmonary disease) (HCC)   Hypothyroidism   Obesity (BMI 30-39.9)   Constipation due to opioid therapy   Lactic acidosis     Thank you for this consultation.  Our [facility] hospitalist team will follow the patient with you.  1. Large bowel perforation 1. Patient on cefepime and Flagyl 2. Management per surgery 2. Lactic acidosis 1. Patient is already received a liter bolus.  Will repeat the bolus and recheck lactic acid in about 3 hours or after surgery 3. Acute cystitis 1. Urine culture collected 2. Cefepime will cover UTI 4. Chronic kidney disease stage III 1. We will watch for nephrotoxic drugs 2. Creatinine at baseline 5. Obesity 1.  6. HIV 1. Continue antivirals 2. Last viral load 1/6 2020 which was undetectable 3. CD4 count 630 on the same date 7. Hypertension 1. Continue antihypertensives 8. COPD 1. No wheezing on exam.  We will continue with inhalers 9. Constipation due to opioid therapy 1. Will likely need bowel regimen once approved by general  surgery 10. Hypothyroidism 1. Continue Synthroid  Time Spent: [place], DO  [facility] 11/12/2018, 4:07 PM

## 2018-11-12 NOTE — Progress Notes (Signed)
CRITICAL VALUE ALERT  Critical Value:  Lactic Acid 5.8 Date & Time Notied:  11/12/18 2242  Provider Notified: Constance Haw  Orders Received/Actions taken: None at this time. Continue to monitor.

## 2018-11-12 NOTE — H&P (Addendum)
[facility] History and Physical  Reason for Referral: Pneumoperitoneum  Referring Physician:  Dr. Sabra Heck   Chief Complaint    Abdominal Pain      [name] is a 78 y.o. female.  HPI: [name] is a 78 yo who came in today with worsening abdominal pain and dysuria. She has had some degree of abdominal pain for a few weeks but it became worse at 10 AM and prompted her to come to the hospital. She says that she has had UTIs and dysuria and has been on antibiotics for this and has completed that antibiotic.  She has a history of HIV following a blood transfusion, stage III CKD, COPD, recurrent UTIs and chronic pain.    She is currently hemodynamically stable and is complaining of pain that is more generalized now than when she first arrived.  She has had associated nausea/vomiting.  Her last colonoscopy was in 1985 and she says she had polyps that were going to "Turn into Cancer."  The documentation in her chart shows that she had a colonoscopy with polyp removal in 2012.  She says that her HIV is under control and that she has good levels. She takes her medications regularly.   Past Medical History:  Diagnosis Date  . Anxiety   . Arthritis   . Bronchitis   . CKD (chronic kidney disease) stage 3, GFR 30-59 ml/min (HCC) 05/04/2016  . COPD (chronic obstructive pulmonary disease) ([place])   . Depression   . Elevated LFTs 05/04/2016  . History of TIAs 1981   left side weakness  . HIV (human immunodeficiency virus infection) ([place])   . Hypertension   . Kidney disease related to HIV infection [facility])    pt states she has to have her creatinine level checked often   . Nausea 03/03/2017  . Peripheral neuropathy    bilateral feet  . Sinusitis 05/06/2018  . UTI (urinary tract infection) 03/03/2017  . Weight loss 03/03/2017    Past Surgical History:  Procedure Laterality Date  . ABDOMINAL HYSTERECTOMY  1985  . BACK SURGERY    . CATARACT EXTRACTION, BILATERAL    .  CHOLECYSTECTOMY    . COLONOSCOPY  08/10/2002   NUR: Normal colonoscopy except for some hemorrhoids and some erythema at the dentate line  . COLONOSCOPY  12/10/2006   Edwards:Diffuse colitis in the transverse and descending colon/This is not entirely typical of ischemic colitis or her HIV positive/ disease.  We need to be concerned about other causes  . COLONOSCOPY  01/19/2011   RMR: Internal and external hemorrhoids, likely source of hematochezia anal papilla, otherwise normal rectal mucosa/ Left-sided diverticula.  Cecal polyp with status post cold snare polypectomy.  Remainder of colonic mucosa appeared normal  . ESOPHAGOGASTRODUODENOSCOPY  08/10/2002     NUR: Mild changes of reflux esophagitis limited to gastroesophageal junction  No evidence of ring, stricture, or esophageal candidiasis dysphagia/ Gastritis, possibly H. pylori induced  . ESOPHAGOGASTRODUODENOSCOPY  12/25/2002   NUR: Erosive antral gastritis.  The degree of gastritis is more significant than her last exam/ Normal examination of the esophagus/ Esophageal dilatation performed by passing 79 and [address] [place] [address]  . ESOPHAGOGASTRODUODENOSCOPY  11/13/2010   RMR: Circumferential distal esophageal erosions with soft peptic stricture, consistent with erosive reflux esophagitis or stricture  formation, status post dilation as described above/  Small hiatal hernia/ Tiny antral erosions, otherwise normal stomach D1 and D2  . HEMORRHOID SURGERY  09/02/2012   Procedure: HEMORRHOIDECTOMY;  Surgeon: Elta Guadeloupe  Lowella Petties, MD;  Location: [facility];  Service: General;  Laterality: N/A;  . TUBAL LIGATION      Family History  Problem Relation Age of Onset  . Diabetes Mother   . Diabetes Brother   . Colon cancer Brother     Social History   Tobacco Use  . Smoking status: Never Smoker  . Smokeless tobacco: Never Used  Substance Use Topics  . Alcohol use: No  . Drug use: No    Medications: I have reviewed the patient's current  medications. Current Facility-Administered Medications  Medication Dose Route Frequency Provider Last Rate Last Dose  . 0.9 %  sodium chloride infusion  1,000 mL Intravenous Continuous Noemi Chapel, MD 125 mL/hr at 11/12/18 1439 1,000 mL at 11/12/18 1439  . sodium chloride 0.9 % bolus 1,000 mL  1,000 mL Intravenous Once Truett Mainland, DO       Current Outpatient Medications  Medication Sig Dispense Refill Last Dose  . albuterol (PROVENTIL HFA;VENTOLIN HFA) 108 (90 Base) MCG/ACT inhaler Inhale 1-2 puffs into the lungs every 6 (six) hours as needed for wheezing or shortness of breath. 1 Inhaler 0 11/12/2018 at 0830  . amitriptyline (ELAVIL) 50 MG tablet Take 50 mg by mouth at bedtime.    11/11/2018 at Unknown time  . cephALEXin (KEFLEX) 500 MG capsule Take 1 capsule (500 mg total) by mouth 3 (three) times daily. 21 capsule 0 11/12/2018 at 0830  . Cholecalciferol (VITAMIN D3) 10000 units TABS Take 1 tablet by mouth daily.   11/11/2018 at Unknown time  . diphenhydrAMINE (BENADRYL) 25 mg capsule Take 25 mg by mouth daily as needed for itching (itching from hydrocodone).   Past Week at Unknown time  . fluconazole (DIFLUCAN) 200 MG tablet Take 1 tablet (200 mg total) by mouth daily. 14 tablet 0 11/11/2018 at Unknown time  . fluticasone (FLONASE) 50 MCG/ACT nasal spray Place 2 sprays into both nostrils at bedtime. 16 g 8 11/12/2018 at 0830  . furosemide (LASIX) 40 MG tablet Take 40 mg by mouth daily as needed for fluid.    11/12/2018 at 0830  . hydrOXYzine (ATARAX/VISTARIL) 25 MG tablet Take 1 tablet (25 mg total) by mouth 3 (three) times daily as needed. 30 tablet 0 Past Week at Unknown time  . ipratropium-albuterol (DUONEB) 0.5-2.5 (3) MG/3ML SOLN Inhale 3 mLs into the lungs every 6 (six) hours as needed (shortness of breath).    Past Week at Unknown time  . LORazepam (ATIVAN) 1 MG tablet Take 1 mg by mouth 2 (two) times daily.   11/12/2018 at 0830  . Multiple Vitamin (ONE-A-DAY 55 PLUS PO) Take 1 tablet by  mouth daily.   Past Week at Unknown time  . nebivolol (BYSTOLIC) 10 MG tablet Take 10 mg by mouth daily.    11/12/2018 at 0830  . ondansetron (ZOFRAN) 8 MG tablet Take 8 mg by mouth daily as needed for nausea or vomiting.    11/11/2018 at Unknown time  . Oxycodone HCl 10 MG TABS Take 10 mg by mouth 4 (four) times daily as needed. For pain   11/12/2018 at 0830  . phenazopyridine (PYRIDIUM) 200 MG tablet Take 1 tablet (200 mg total) by mouth 3 (three) times daily as needed for pain. 9 tablet 0 11/12/2018 at 0700  . polyvinyl alcohol (LIQUIFILM TEARS) 1.4 % ophthalmic solution Place 1 drop into both eyes as needed for dry eyes.   11/11/2018 at Unknown time  . thiamine (VITAMIN B-1) 100 MG tablet Take  100 mg by mouth daily.   11/12/2018 at 0830  . TRIUMEQ 600-50-300 MG tablet TAKE 1 TABLET ONCE DAILY. 30 tablet 1 11/12/2018 at 0830  . guaiFENesin (MUCINEX) 600 MG 12 hr tablet Take 1 tablet (600 mg total) by mouth 2 (two) times daily. 30 tablet 0 Not Taking  . hydrALAZINE (APRESOLINE) 25 MG tablet Take 1 tablet (25 mg total) by mouth every 8 (eight) hours. (Patient not taking: Reported on 04/04/2018) 90 tablet 0 Not Taking   Allergies  Allergen Reactions  . Bee Venom Anaphylaxis  . Tenofovir Disoproxil Fumarate [Tenofovir Disoproxil Fumarate] Other (See Comments)    Renal failure, (08/27/2006)  . Compazine  [Prochlorperazine Edisylate] Nausea And Vomiting  . Haloperidol Lactate Other (See Comments)    Reaction is muscle tension, causes severe spasms in face and neck. Forces eyes to roll in the back of the head  . Codeine Itching and Nausea And Vomiting  . Navane [Thiothixene] Other (See Comments) and Nausea And Vomiting    Same muscle spasm reaction as haldol  . Prednisone     Brief mild psychosis and agitation  . Prochlorperazine Nausea And Vomiting  . Promethazine Hcl Other (See Comments)    Same as haldol   . Propoxyphene N-Acetaminophen Itching and Nausea And Vomiting  . Morphine Itching and  Swelling    ROS:  A comprehensive review of systems was negative except for: Gastrointestinal: positive for abdominal pain, nausea and vomiting Genitourinary: positive for dysuria  Blood pressure 104/80, pulse (!) 103, temperature 98.3 F (36.8 C), temperature source Oral, resp. rate (!) 23, height 5\' 4"  (1.626 m), weight 83.5 kg, SpO2 93 %. Physical Exam Vitals signs reviewed.  Constitutional:      Appearance: She is well-developed.  HENT:     Head: Normocephalic.  Eyes:     Extraocular Movements: Extraocular movements intact.  Cardiovascular:     Rate and Rhythm: Normal rate and regular rhythm.  Pulmonary:     Effort: Pulmonary effort is normal.     Breath sounds: Normal breath sounds.  Abdominal:     General: Abdomen is flat. There is no distension.     Tenderness: There is generalized abdominal tenderness. There is guarding. There is no rebound.  Skin:    General: Skin is warm and dry.  Neurological:     General: No focal deficit present.     Mental Status: She is alert and oriented to person, place, and time.  Psychiatric:        Mood and Affect: Mood normal.        Behavior: Behavior normal.     Results: Results for orders placed or performed during the hospital encounter of 11/12/18 (from the past 48 hour(s))  Urinalysis, Routine w reflex microscopic     Status: Abnormal   Collection Time: 11/12/18 12:23 PM  Result Value Ref Range   Color, Urine AMBER (A) YELLOW    Comment: BIOCHEMICALS MAY BE AFFECTED BY COLOR   APPearance CLOUDY (A) CLEAR   Specific Gravity, Urine 1.015 1.005 - 1.030   pH 5.0 5.0 - 8.0   Glucose, UA NEGATIVE NEGATIVE mg/dL   Hgb urine dipstick NEGATIVE NEGATIVE   Bilirubin Urine NEGATIVE NEGATIVE   Ketones, ur 5 (A) NEGATIVE mg/dL   Protein, ur 100 (A) NEGATIVE mg/dL   Nitrite POSITIVE (A) NEGATIVE   Leukocytes, UA TRACE (A) NEGATIVE   RBC / HPF 0-5 0 - 5 RBC/hpf   WBC, UA >50 (H) 0 - 5 WBC/hpf  Bacteria, UA NONE SEEN NONE SEEN    Squamous Epithelial / LPF 0-5 0 - 5    Comment: Performed at [facility], [address] [place], [place] [address]  CBC with Differential/Platelet     Status: Abnormal   Collection Time: 11/12/18 12:40 PM  Result Value Ref Range   WBC 8.7 4.0 - 10.5 K/uL   RBC 4.18 3.87 - 5.11 MIL/uL   Hemoglobin 14.1 12.0 - 15.0 g/dL   HCT 42.2 36.0 - 46.0 %   MCV 101.0 (H) 80.0 - 100.0 fL   MCH 33.7 26.0 - 34.0 pg   MCHC 33.4 30.0 - 36.0 g/dL   RDW 12.0 11.5 - 15.5 %   Platelets 202 150 - 400 K/uL   nRBC 0.0 0.0 - 0.2 %   Neutrophils Relative % 49 %   Neutro Abs 4.3 1.7 - 7.7 K/uL   Lymphocytes Relative 43 %   Lymphs Abs 3.7 0.7 - 4.0 K/uL   Monocytes Relative 5 %   Monocytes Absolute 0.5 0.1 - 1.0 K/uL   Eosinophils Relative 1 %   Eosinophils Absolute 0.1 0.0 - 0.5 K/uL   Basophils Relative 1 %   Basophils Absolute 0.0 0.0 - 0.1 K/uL   Immature Granulocytes 1 %   Abs Immature Granulocytes 0.04 0.00 - 0.07 K/uL    Comment: Performed at [facility], [address], [place], [place] [address]  Comprehensive metabolic panel     Status: Abnormal   Collection Time: 11/12/18 12:40 PM  Result Value Ref Range   Sodium 139 135 - 145 mmol/L   Potassium 4.0 3.5 - 5.1 mmol/L   Chloride 107 98 - 111 mmol/L   CO2 23 22 - 32 mmol/L   Glucose, Bld 157 (H) 70 - 99 mg/dL   BUN 18 8 - 23 mg/dL   Creatinine, Ser 1.78 (H) 0.44 - 1.00 mg/dL   Calcium 10.0 8.9 - 10.3 mg/dL   Total Protein 6.4 (L) 6.5 - 8.1 g/dL   Albumin 4.1 3.5 - 5.0 g/dL   AST 24 15 - 41 U/L   ALT 20 0 - 44 U/L   Alkaline Phosphatase 64 38 - 126 U/L   Total Bilirubin 1.3 (H) 0.3 - 1.2 mg/dL   GFR calc non Af Amer 27 (L) >60 mL/min   GFR calc Af Amer 31 (L) >60 mL/min   Anion gap 9 5 - 15    Comment: Performed at [facility], [address], [place], [place] [address]  Lipase, blood     Status: None   Collection Time: 11/12/18 12:40 PM  Result Value Ref Range   Lipase 24 11 - 51 U/L    Comment: Performed at [facility], [address], [place], [place] [address]  Type and screen     Status: None (Preliminary result)   Collection Time: 11/12/18  2:58 PM  Result Value Ref Range   ABO/RH(D) A NEG    Antibody Screen PENDING    Sample Expiration      11/15/2018 Performed at [facility], [address], [place], [place] [address]   Lactic acid, plasma     Status: Abnormal   Collection Time: 11/12/18  2:58 PM  Result Value Ref Range   Lactic Acid, Venous 3.7 (HH) 0.5 - 1.9 mmol/L    Comment: CRITICAL RESULT CALLED TO, READ BACK BY AND VERIFIED WITH: WHITE M. @ 1603 ON [id number] BY HENDERSON Performed at [facility], [address] [place],  [address]  Personally reviewed CT- free air, dilated colon with stool, left side colon with stool balls and thickened wall, ? Not sure if this is just stool or mass   Ct Abdomen Pelvis Wo Contrast  Result Date: 11/12/2018 CLINICAL DATA:  Abdominal pain with nausea and vomiting. EXAM: CT ABDOMEN AND PELVIS WITHOUT CONTRAST TECHNIQUE: Multidetector CT imaging of the abdomen and pelvis was performed following the standard protocol without IV contrast. COMPARISON:  May 31, 2014 FINDINGS: Lower chest: Coronary artery calcifications. Small hiatal hernia. Lower chest otherwise unremarkable. Hepatobiliary: Cholecystectomy. Evaluation of the liver is limited without contrast but no obvious liver masses are noted. Pancreas: Unremarkable. No pancreatic ductal dilatation or surrounding inflammatory changes. Spleen: Normal in size without focal abnormality. Adrenals/Urinary Tract: There is a fat containing nodule in the left adrenal gland consistent with a myelolipoma, unchanged since August 2015 measuring 2 cm. Right adrenal gland is normal. Kidneys and ureters are normal. The bladder is thick walled with fat stranding adjacent to the bladder. Stomach/Bowel: There is a small hiatal hernia. The stomach is otherwise normal. The small bowel is unremarkable. The colon is markedly abnormal. There is  a stool ball in the distal transverse colon with adjacent colonic wall thickening. There is suggested pneumatosis in the wall of the colon in this location. Wall thickening appears to extend into the more proximal distal half of the transverse colon. There is free air and fat stranding surrounding the colon containing the stool ball. The proximal descending colon appears inflamed which may be secondary to the overall process. Colonic diverticuli are seen throughout the descending and sigmoid colon. The ascending and proximal transverse colon are normal. The appendix is not visualized but there is no secondary evidence of appendicitis. Vascular/Lymphatic: Minimal atherosclerotic changes in the abdominal aorta. No adenopathy. Reproductive: Status post hysterectomy. No adnexal masses. Other: There is some free fluid in the pelvis, likely reactive to either the bladder or colonic pathology. Free air in the abdomen is identified, relatively diffuse in distribution. However, the largest region of free air is adjacent to the abnormal distal transverse colon suggesting this is the location of perforation. No obvious portal venous gas. Musculoskeletal: No acute or significant osseous findings. IMPRESSION: 1. Free air throughout the abdomen consistent with bowel perforation. The largest region of free air is adjacent to the abnormal transverse colon which contains wall thickening and a stool ball. The wall thickening extends over several cm into the distal half of the transverse colon. The proximal descending colon appears inflamed as well which may be secondary to the transverse colon abnormality. I suspect the distal transverse colon in the region of the stool ball is the location of perforation and there is suggested pneumatosis in the wall of the colon in this location. The findings could be due to an infectious, inflammatory, or vascular colitis/process. Neoplasm/malignancy is not excluded. 2. Thick-walled bladder with  adjacent fat stranding suggesting cystitis. Recommend correlation with urinalysis. 3. Left adrenal myelolipoma. 4. Colonic diverticulosis. Findings called to Dr. Thurnell Garbe. Electronically Signed   By: Dorise Bullion III M.D   On: 11/12/2018 14:47    Assessment & Plan:  VIVICA DOBOSZ is a 78 y.o. female with pneumoperitoneum of unknown etiology. Going to the OR for exploration. Have discussed that this could be secondary to a stercoral ulcer, some infection/ colitis, and possibly cancer. Have discussed that she will likely get sicker before she would get better given the findings. Have discussed the risk of needing rehab after surgery. Have discussed the  possibility of ICU stay and remaining intubated.   -OR for exploration  -Consent for surgery obtained and possibly central venous line placement  -Patient ok to receive blood if she needs it -Hospitalist have seen the patient too due to her medical issues   All questions were answered to the satisfaction of the patient and family.  The risk and benefits of exploration were discussed including but not limited to bleeding, infection, injury to other organs, colostomy.  After careful consideration, MYCALA WARSHAWSKY has decided to proceed.    Virl Cagey 11/12/2018, 4:42 PM

## 2018-11-12 NOTE — Procedures (Signed)
Procedure Note    Preoperative Diagnosis:  Hypotension    Postoperative Diagnosis: Same   Procedure(s) Performed: Central Line placement, right jugular    Surgeon: Lanell Matar. Constance Haw, MD   Assistants: None   Anesthesia: 1% lidocaine    Complications: None    Indications: [name] is a 78 y.o. with hypotension, septic shock from fecal peritonitis. I discussed the risk and benefits of placement of the central line with her and her family, including but not limited to bleeding, infection, and risk of pneumothorax. The patient and the family has given written and verbal consent for the procedure.    Procedure: The patient placed supine. The right chest and neck was prepped and draped in the usual sterile fashion.  Wearing full gown and gloves, I performed the procedure.  One percent lidocaine was used for local anesthesia.  Attempt was made to access the right subclavian but this was not successful. An ultrasound was utilized to assess the jugular vein.  The needle with syringe was advanced into the right jugular vein with dark venous return, and a wire was placed using the Seldinger technique without difficulty.  Ectopia was not noted.  The skin was knicked and a dilator was placed, and the three lumen catheter was placed over the wire with continued control of the wire.  There was good draw back of blood from all three lumens and each flushed easily with saline.  The catheter was secured in 4 points with 2-0 silk and a biopatch and dressing was placed.     The patient tolerated the procedure well, and the CXR was ordered to confirm position of the central line.   Curlene Labrum, MD [facility] [address] [place], [place] [address] [phone] (office)

## 2018-11-12 NOTE — Anesthesia Preprocedure Evaluation (Addendum)
Anesthesia Evaluation  General Assessment Comment:States aspirated in the past and was kept on life support   History of Anesthesia Complications (+) history of anesthetic complications  Airway Mallampati: III  TM Distance: >3 FB Neck ROM: Full    Dental no notable dental hx. (+) Edentulous Upper, Edentulous Lower   Pulmonary pneumonia, resolved, COPD,    Pulmonary exam normal breath sounds clear to auscultation       Cardiovascular Exercise Tolerance: Poor hypertension, Pt. on medications Normal cardiovascular examII Rhythm:Regular Rate:Normal     Neuro/Psych Anxiety Depression Schizophrenia CVA remotely 1981 uses cane -very sedentary - states gets about 300 feet  Neuromuscular disease    GI/Hepatic hiatal hernia,   Endo/Other  Hypothyroidism   Renal/GU Renal disease     Musculoskeletal  (+) Arthritis ,   Abdominal   Peds  Hematology   Anesthesia Other Findings HIV+-states in remission  Reproductive/Obstetrics                            Anesthesia Physical Anesthesia Plan  ASA: IV and emergent  Anesthesia Plan: General   Post-op Pain Management:    Induction: Intravenous  PONV Risk Score and Plan:   Airway Management Planned: Oral ETT  Additional Equipment:   Intra-op Plan:   Post-operative Plan: Extubation in OR  Informed Consent: I have reviewed the patients History and Physical, chart, labs and discussed the procedure including the risks, benefits and alternatives for the proposed anesthesia with the patient or authorized representative who has indicated his/her understanding and acceptance.     Dental advisory given  Plan Discussed with: CRNA  Anesthesia Plan Comments: (D/W poss postop ventilation as needed- AVU-WTP)        Anesthesia Quick Evaluation

## 2018-11-12 NOTE — ED Triage Notes (Signed)
Patient c/o lower abd pain with nausea and vomiting. Denies any diarrhea and unsure of any fevers. Per patient dysuria. Patient states "feels like I'm having a baby." Patient reports hx of frequent UTIs.

## 2018-11-12 NOTE — ED Notes (Signed)
Dr. Bridges at bedside 

## 2018-11-13 ENCOUNTER — Encounter (HOSPITAL_COMMUNITY): Payer: Self-pay

## 2018-11-13 ENCOUNTER — Other Ambulatory Visit: Payer: Self-pay

## 2018-11-13 DIAGNOSIS — A419 Sepsis, unspecified organism: Principal | ICD-10-CM

## 2018-11-13 DIAGNOSIS — R6521 Severe sepsis with septic shock: Secondary | ICD-10-CM

## 2018-11-13 LAB — TYPE AND SCREEN
ABO/RH(D): A NEG
Antibody Screen: POSITIVE

## 2018-11-13 LAB — CBC WITH DIFFERENTIAL/PLATELET
Abs Immature Granulocytes: 0.23 10*3/uL — ABNORMAL HIGH (ref 0.00–0.07)
BASOS ABS: 0 10*3/uL (ref 0.0–0.1)
Basophils Relative: 0 %
EOS PCT: 0 %
Eosinophils Absolute: 0 10*3/uL (ref 0.0–0.5)
HCT: 36.9 % (ref 36.0–46.0)
HEMOGLOBIN: 12.2 g/dL (ref 12.0–15.0)
Immature Granulocytes: 2 %
LYMPHS PCT: 19 %
Lymphs Abs: 2.9 10*3/uL (ref 0.7–4.0)
MCH: 34.4 pg — AB (ref 26.0–34.0)
MCHC: 33.1 g/dL (ref 30.0–36.0)
MCV: 103.9 fL — ABNORMAL HIGH (ref 80.0–100.0)
Monocytes Absolute: 1.1 10*3/uL — ABNORMAL HIGH (ref 0.1–1.0)
Monocytes Relative: 7 %
NRBC: 0 % (ref 0.0–0.2)
Neutro Abs: 11.3 10*3/uL — ABNORMAL HIGH (ref 1.7–7.7)
Neutrophils Relative %: 72 %
Platelets: 238 10*3/uL (ref 150–400)
RBC: 3.55 MIL/uL — ABNORMAL LOW (ref 3.87–5.11)
RDW: 12.3 % (ref 11.5–15.5)
WBC: 15.5 10*3/uL — ABNORMAL HIGH (ref 4.0–10.5)

## 2018-11-13 LAB — BASIC METABOLIC PANEL
ANION GAP: 9 (ref 5–15)
BUN: 30 mg/dL — ABNORMAL HIGH (ref 8–23)
CO2: 18 mmol/L — ABNORMAL LOW (ref 22–32)
Calcium: 8.7 mg/dL — ABNORMAL LOW (ref 8.9–10.3)
Chloride: 114 mmol/L — ABNORMAL HIGH (ref 98–111)
Creatinine, Ser: 2.09 mg/dL — ABNORMAL HIGH (ref 0.44–1.00)
GFR calc Af Amer: 26 mL/min — ABNORMAL LOW (ref >60)
GFR calc non Af Amer: 22 mL/min — ABNORMAL LOW (ref >60)
Glucose, Bld: 159 mg/dL — ABNORMAL HIGH (ref 70–99)
Potassium: 4.8 mmol/L (ref 3.5–5.1)
Sodium: 141 mmol/L (ref 135–145)

## 2018-11-13 LAB — PHOSPHORUS: Phosphorus: 0.7 mg/dL — CL (ref 2.5–4.6)

## 2018-11-13 LAB — LACTIC ACID, PLASMA: Lactic Acid, Venous: 4 mmol/L (ref 0.5–1.9)

## 2018-11-13 LAB — MAGNESIUM: MAGNESIUM: 1.3 mg/dL — AB (ref 1.7–2.4)

## 2018-11-13 MED ORDER — MAGNESIUM SULFATE 2 GM/50ML IV SOLN
2.0000 g | Freq: Once | INTRAVENOUS | Status: AC
  Administered 2018-11-13: 2 g via INTRAVENOUS
  Filled 2018-11-13: qty 50

## 2018-11-13 MED ORDER — LORAZEPAM 2 MG/ML IJ SOLN
0.5000 mg | Freq: Once | INTRAMUSCULAR | Status: AC
  Administered 2018-11-13: 0.5 mg via INTRAVENOUS
  Filled 2018-11-13: qty 1

## 2018-11-13 MED ORDER — ORAL CARE MOUTH RINSE
15.0000 mL | Freq: Two times a day (BID) | OROMUCOSAL | Status: DC
  Administered 2018-11-13 (×2): 15 mL via OROMUCOSAL

## 2018-11-13 MED ORDER — CHLORHEXIDINE GLUCONATE 0.12 % MT SOLN
15.0000 mL | Freq: Two times a day (BID) | OROMUCOSAL | Status: DC
  Administered 2018-11-13 – 2018-11-21 (×17): 15 mL via OROMUCOSAL
  Filled 2018-11-13 (×15): qty 15

## 2018-11-13 MED ORDER — METOPROLOL TARTRATE 25 MG PO TABS: 12.5000 mg | ORAL_TABLET | Freq: Two times a day (BID) | ORAL | Status: DC

## 2018-11-13 MED ORDER — ORAL CARE MOUTH RINSE
15.0000 mL | Freq: Two times a day (BID) | OROMUCOSAL | Status: DC
  Administered 2018-11-13 – 2018-11-21 (×10): 15 mL via OROMUCOSAL

## 2018-11-13 MED ORDER — SODIUM PHOSPHATES 45 MMOLE/15ML IV SOLN
30.0000 mmol | Freq: Once | INTRAVENOUS | Status: AC
  Administered 2018-11-13: 30 mmol via INTRAVENOUS
  Filled 2018-11-13: qty 10

## 2018-11-13 NOTE — Progress Notes (Signed)
Patient was given IS but currently unable to due exercise due to abdominal pain. Patient is aware of the use of the device and has used one in the past. RT/nursing will revisit patient in the morning to go over device.

## 2018-11-13 NOTE — Progress Notes (Signed)
Critical LA of 4.0. Decreased from 5.8. Called at 11/13/18 at [place]. [place] contacted

## 2018-11-13 NOTE — Progress Notes (Signed)
Removed peri-op dressing. Site is beefy red without s/s infection. Staples to upper and lower incision intact.  Applied wet to dry dressing to abdominal surgical site.  Covered with ABD and secured with surgical tape.  Tolerated well.   Daughter in law at bedside to watch dressing change and voice questions/concerns.

## 2018-11-13 NOTE — Progress Notes (Signed)
Elink called unit around 0115. Updated elink on PT's condition and vitals. Elink suggested use of CVP line to monitor PT. Note left for MD to address CVP has she sees fit. Continue to monitor.

## 2018-11-13 NOTE — Progress Notes (Signed)
MEDICATION RELATED CONSULT NOTE - INITIAL   Pharmacy Consult for electrolyte replacement Indication: abnormal electrolytes   Allergies  Allergen Reactions  . Bee Venom Anaphylaxis  . Tenofovir Disoproxil Fumarate [Tenofovir Disoproxil Fumarate] Other (See Comments)    Renal failure, (08/27/2006)  . Compazine  [Prochlorperazine Edisylate] Nausea And Vomiting  . Haloperidol Lactate Other (See Comments)    Reaction is muscle tension, causes severe spasms in face and neck. Forces eyes to roll in the back of the head  . Codeine Itching and Nausea And Vomiting  . Navane [Thiothixene] Other (See Comments) and Nausea And Vomiting    Same muscle spasm reaction as haldol  . Prednisone     Brief mild psychosis and agitation  . Prochlorperazine Nausea And Vomiting  . Promethazine Hcl Other (See Comments)    Same as haldol   . Propoxyphene N-Acetaminophen Itching and Nausea And Vomiting  . Morphine Itching and Swelling    Patient Measurements: Height: 5\' 4"  (162.6 cm) Weight: 216 lb 4.3 oz (98.1 kg) IBW/kg (Calculated) : 54.7   Vital Signs: Temp: 97.9 F (36.6 C) (01/19 0728) Temp Source: Oral (01/19 0728) BP: 111/45 (01/19 0615) Pulse Rate: 97 (01/19 0728) Intake/Output from previous day: 01/18 0701 - 01/19 0700 In: 5709.3 [I.V.:2427.6; IV Piggyback:3281.7] Out: 1200 [Urine:250; Emesis/NG output:650; Blood:50] Intake/Output from this shift: No intake/output data recorded.  Labs: Recent Labs    11/12/18 1240 11/13/18 0240  WBC 8.7 15.5*  HGB 14.1 12.2  HCT 42.2 36.9  PLT 202 238  CREATININE 1.78* 2.09*  MG  --  1.3*  PHOS  --  0.7*  ALBUMIN 4.1  --   PROT 6.4*  --   AST 24  --   ALT 20  --   ALKPHOS 64  --   BILITOT 1.3*  --    Estimated Creatinine Clearance: 25.7 mL/min (A) (by C-G formula based on SCr of 2.09 mg/dL (H)).   Microbiology: Recent Results (from the past 720 hour(s))  MRSA PCR Screening     Status: None   Collection Time: 11/12/18  8:00 PM  Result  Value Ref Range Status   MRSA by PCR NEGATIVE NEGATIVE Final    Comment:        The GeneXpert MRSA Assay (FDA approved for NASAL specimens only), is one component of a comprehensive MRSA colonization surveillance program. It is not intended to diagnose MRSA infection nor to guide or monitor treatment for MRSA infections. Performed at [facility], [address], [place], [place] [address]   Culture, blood (routine x 2)     Status: None (Preliminary result)   Collection Time: 11/12/18  9:25 PM  Result Value Ref Range Status   Specimen Description A-LINE  Final   Special Requests   Final    BOTTLES DRAWN AEROBIC AND ANAEROBIC Blood Culture adequate volume   Culture   Final    NO GROWTH < 12 HOURS Performed at [facility], [address] [place], [place] [address]    Report Status PENDING  Incomplete  Culture, blood (routine x 2)     Status: None (Preliminary result)   Collection Time: 11/12/18  9:37 PM  Result Value Ref Range Status   Specimen Description BLOOD LEFT HAND  Final   Special Requests   Final    BOTTLES DRAWN AEROBIC AND ANAEROBIC Blood Culture adequate volume   Culture   Final    NO GROWTH < 12 HOURS Performed at [facility], [address] [place], [place]  [place]    Report Status PENDING  Incomplete    Medical History: Past Medical History:  Diagnosis Date  . Anxiety   . Arthritis   . Bronchitis   . CKD (chronic kidney disease) stage 3, GFR 30-59 ml/min (HCC) 05/04/2016  . COPD (chronic obstructive pulmonary disease) ([place])   . Depression   . Elevated LFTs 05/04/2016  . History of TIAs 1981   left side weakness  . HIV (human immunodeficiency virus infection) ([place])   . Hypertension   . Kidney disease related to HIV infection [facility])    pt states she has to have her creatinine level checked often   . Nausea 03/03/2017  . Peripheral neuropathy    bilateral feet  . Sinusitis 05/06/2018  . UTI (urinary tract infection) 03/03/2017  . Weight loss 03/03/2017     Medications:  Medications Prior to Admission  Medication Sig Dispense Refill Last Dose  . albuterol (PROVENTIL HFA;VENTOLIN HFA) 108 (90 Base) MCG/ACT inhaler Inhale 1-2 puffs into the lungs every 6 (six) hours as needed for wheezing or shortness of breath. 1 Inhaler 0 11/12/2018 at 0830  . amitriptyline (ELAVIL) 50 MG tablet Take 50 mg by mouth at bedtime.    11/11/2018 at Unknown time  . cephALEXin (KEFLEX) 500 MG capsule Take 1 capsule (500 mg total) by mouth 3 (three) times daily. 21 capsule 0 11/12/2018 at 0830  . Cholecalciferol (VITAMIN D3) 10000 units TABS Take 1 tablet by mouth daily.   11/11/2018 at Unknown time  . diphenhydrAMINE (BENADRYL) 25 mg capsule Take 25 mg by mouth daily as needed for itching (itching from hydrocodone).   Past Week at Unknown time  . fluconazole (DIFLUCAN) 200 MG tablet Take 1 tablet (200 mg total) by mouth daily. 14 tablet 0 11/11/2018 at Unknown time  . fluticasone (FLONASE) 50 MCG/ACT nasal spray Place 2 sprays into both nostrils at bedtime. 16 g 8 11/12/2018 at 0830  . furosemide (LASIX) 40 MG tablet Take 40 mg by mouth daily as needed for fluid.    11/12/2018 at 0830  . hydrOXYzine (ATARAX/VISTARIL) 25 MG tablet Take 1 tablet (25 mg total) by mouth 3 (three) times daily as needed. 30 tablet 0 Past Week at Unknown time  . ipratropium-albuterol (DUONEB) 0.5-2.5 (3) MG/3ML SOLN Inhale 3 mLs into the lungs every 6 (six) hours as needed (shortness of breath).    Past Week at Unknown time  . LORazepam (ATIVAN) 1 MG tablet Take 1 mg by mouth 2 (two) times daily.   11/12/2018 at 0830  . Multiple Vitamin (ONE-A-DAY 55 PLUS PO) Take 1 tablet by mouth daily.   Past Week at Unknown time  . nebivolol (BYSTOLIC) 10 MG tablet Take 10 mg by mouth daily.    11/12/2018 at 0830  . ondansetron (ZOFRAN) 8 MG tablet Take 8 mg by mouth daily as needed for nausea or vomiting.    11/11/2018 at Unknown time  . Oxycodone HCl 10 MG TABS Take 10 mg by mouth 4 (four) times daily as needed.  For pain   11/12/2018 at 0830  . phenazopyridine (PYRIDIUM) 200 MG tablet Take 1 tablet (200 mg total) by mouth 3 (three) times daily as needed for pain. 9 tablet 0 11/12/2018 at 0700  . polyvinyl alcohol (LIQUIFILM TEARS) 1.4 % ophthalmic solution Place 1 drop into both eyes as needed for dry eyes.   11/11/2018 at Unknown time  . thiamine (VITAMIN B-1) 100 MG tablet Take 100 mg by mouth daily.   11/12/2018 at  0830  . TRIUMEQ 600-50-300 MG tablet TAKE 1 TABLET ONCE DAILY. 30 tablet 1 11/12/2018 at 0830  . guaiFENesin (MUCINEX) 600 MG 12 hr tablet Take 1 tablet (600 mg total) by mouth 2 (two) times daily. 30 tablet 0 Not Taking  . hydrALAZINE (APRESOLINE) 25 MG tablet Take 1 tablet (25 mg total) by mouth every 8 (eight) hours. (Patient not taking: Reported on 04/04/2018) 90 tablet 0 Not Taking    Assessment: Pharmacy consulted to replace electrolytes in patient admitted for hypotension, septic shock from fecal peritonitis.  Patient is 1 day postop now with electrolyte deficiencies.  Goal of Therapy:  Replace electrolytes   Plan:  Magnesium 2 grams x 1 dose Sodium phosphate 30 mmol x 1 dose. Monitor electrolytes in AM  Remo Lipps C Terryn Rosenkranz 11/13/2018,9:05 AM

## 2018-11-13 NOTE — Progress Notes (Signed)
PROGRESS NOTE    Hatsuko Bizzarro Splinter  SEG:[id number] DOB: 14-Dec-1940 DOA: 11/12/2018 PCP: Octavio Graves, DO    Brief Narrative:  78 year old female with a history of HIV, chronic kidney disease stage III, COPD, chronic pain, presents to the hospital with left-sided abdominal pain.  She was found to have perforation of her transverse colon on imaging.  She was admitted to the hospital under general surgery and underwent exploratory laparotomy.  Hospital service was asked to consult on the patient to help with medical management.   Assessment & Plan:   Principal Problem:   Large bowel perforation (HCC) Active Problems:   Human immunodeficiency virus (HIV) disease ([place])   Acute cystitis   HTN (hypertension)   CKD (chronic kidney disease) stage 3, GFR 30-59 ml/min (HCC)   COPD (chronic obstructive pulmonary disease) (HCC)   Hypothyroidism   Obesity (BMI 30-39.9)   Constipation due to opioid therapy   Lactic acidosis   Peritonitis (HCC)   Stercoral ulcer of large intestine   1. Stercoral ulcer with perforation and resultant fecal peritonitis.  Status post exploratory laparotomy, descending colon resection and end colostomy by general surgery.  Patient currently has NG tube in place.  She is on Zosyn.  Defer further postoperative care to general surgery 2. Chronic kidney disease stage III.  Mild bump in creatinine from yesterday likely related to hypotension.  Continue to monitor urine output. 3. Septic shock.  Patient remained hypotensive with increased lactic acid secondary to peritonitis.  She is on IV hydration and was started on norepinephrine infusion yesterday.  Lactic acid has since started to trend down.  Blood pressures appear to be improving.  CVP check noted to be between the 9 and 11 range.  Blood cultures been sent.  We will continue IV antibiotics for now.  Wean off norepinephrine as tolerated. 4. HIV.  Continue antiretrovirals once able to take p.o.  Last viral load checked on  10/31/2018 was undetectable. 5. Hypertension.  Antihypertensives currently on hold while she is hypotensive on norepinephrine. 6. COPD.  No wheezing on exam.  Continue bronchodilators as needed. 7. Hypothyroidism.  Continue on Synthroid once able to take p.o. 8. Possible cystitis.  Currently on antibiotics.  Follow-up urine culture.   Subjective: Complaining of pain in her abdomen.  Denies any shortness of breath.  Objective: Vitals:   11/13/18 0900 11/13/18 1000 11/13/18 1100 11/13/18 1132  BP: 120/66 (!) 147/76 (!) 148/72 (!) 156/55  Pulse: 90 93 96 93  Resp: (!) 9 (!) 24 (!) 24 (!) 23  Temp:    (!) 100.9 F (38.3 C)  TempSrc:    Axillary  SpO2: 95% 95% 97% 98%  Weight:      Height:        Intake/Output Summary (Last 24 hours) at 11/13/2018 1226 Last data filed at 11/13/2018 1100 Gross per 24 hour  Intake 5759.26 ml  Output 2200 ml  Net 3559.26 ml   Filed Weights   11/12/18 1144 11/12/18 1958 11/13/18 0615  Weight: 83.5 kg 91.7 kg 98.1 kg    Examination:  General exam: Appears calm and comfortable  Respiratory system: Clear to auscultation. Respiratory effort normal. Cardiovascular system: S1 & S2 heard, RRR. No JVD, murmurs, rubs, gallops or clicks. No pedal edema. Gastrointestinal system: Abdomen is nondistended, soft and nontender. No organomegaly or masses felt. Normal bowel sounds heard. Colostomy in LLQ with midline wound, dressing not removed Central nervous system: Alert and oriented. No focal neurological deficits. Extremities: Symmetric 5 x 5  power. Skin: No rashes, lesions or ulcers Psychiatry: Judgement and insight appear normal. Mood & affect appropriate.     Data Reviewed: I have personally reviewed following labs and imaging studies  CBC: Recent Labs  Lab 11/12/18 1240 11/13/18 0240  WBC 8.7 15.5*  NEUTROABS 4.3 11.3*  HGB 14.1 12.2  HCT 42.2 36.9  MCV 101.0* 103.9*  PLT 202 601   Basic Metabolic Panel: Recent Labs  Lab 11/12/18 1240  11/13/18 0240  NA 139 141  K 4.0 4.8  CL 107 114*  CO2 23 18*  GLUCOSE 157* 159*  BUN 18 30*  CREATININE 1.78* 2.09*  CALCIUM 10.0 8.7*  MG  --  1.3*  PHOS  --  0.7*   GFR: Estimated Creatinine Clearance: 25.7 mL/min (A) (by C-G formula based on SCr of 2.09 mg/dL (H)). Liver Function Tests: Recent Labs  Lab 11/12/18 1240  AST 24  ALT 20  ALKPHOS 64  BILITOT 1.3*  PROT 6.4*  ALBUMIN 4.1   Recent Labs  Lab 11/12/18 1240  LIPASE 24   No results for input(s): AMMONIA in the last 168 hours. Coagulation Profile: No results for input(s): INR, PROTIME in the last 168 hours. Cardiac Enzymes: No results for input(s): CKTOTAL, CKMB, CKMBINDEX, TROPONINI in the last 168 hours. BNP (last 3 results) No results for input(s): PROBNP in the last 8760 hours. HbA1C: No results for input(s): HGBA1C in the last 72 hours. CBG: No results for input(s): GLUCAP in the last 168 hours. Lipid Profile: No results for input(s): CHOL, HDL, LDLCALC, TRIG, CHOLHDL, LDLDIRECT in the last 72 hours. Thyroid Function Tests: No results for input(s): TSH, T4TOTAL, FREET4, T3FREE, THYROIDAB in the last 72 hours. Anemia Panel: No results for input(s): VITAMINB12, FOLATE, FERRITIN, TIBC, IRON, RETICCTPCT in the last 72 hours. Sepsis Labs: Recent Labs  Lab 11/12/18 1458 11/12/18 2025 11/13/18 0240  LATICACIDVEN 3.7* 5.8* 4.0*    Recent Results (from the past 240 hour(s))  MRSA PCR Screening     Status: None   Collection Time: 11/12/18  8:00 PM  Result Value Ref Range Status   MRSA by PCR NEGATIVE NEGATIVE Final    Comment:        The GeneXpert MRSA Assay (FDA approved for NASAL specimens only), is one component of a comprehensive MRSA colonization surveillance program. It is not intended to diagnose MRSA infection nor to guide or monitor treatment for MRSA infections. Performed at [facility], [address] [place], [place] [address]   Culture, blood (routine x 2)     Status: None  (Preliminary result)   Collection Time: 11/12/18  9:25 PM  Result Value Ref Range Status   Specimen Description A-LINE  Final   Special Requests   Final    BOTTLES DRAWN AEROBIC AND ANAEROBIC Blood Culture adequate volume   Culture   Final    NO GROWTH < 12 HOURS Performed at [facility], [address], [place], [place] [address]    Report Status PENDING  Incomplete  Culture, blood (routine x 2)     Status: None (Preliminary result)   Collection Time: 11/12/18  9:37 PM  Result Value Ref Range Status   Specimen Description BLOOD LEFT HAND  Final   Special Requests   Final    BOTTLES DRAWN AEROBIC AND ANAEROBIC Blood Culture adequate volume   Culture   Final    NO GROWTH < 12 HOURS Performed at [facility], [address] [place], [place] [address]    Report  Status PENDING  Incomplete         Radiology Studies: Ct Abdomen Pelvis Wo Contrast  Result Date: 11/12/2018 CLINICAL DATA:  Abdominal pain with nausea and vomiting. EXAM: CT ABDOMEN AND PELVIS WITHOUT CONTRAST TECHNIQUE: Multidetector CT imaging of the abdomen and pelvis was performed following the standard protocol without IV contrast. COMPARISON:  May 31, 2014 FINDINGS: Lower chest: Coronary artery calcifications. Small hiatal hernia. Lower chest otherwise unremarkable. Hepatobiliary: Cholecystectomy. Evaluation of the liver is limited without contrast but no obvious liver masses are noted. Pancreas: Unremarkable. No pancreatic ductal dilatation or surrounding inflammatory changes. Spleen: Normal in size without focal abnormality. Adrenals/Urinary Tract: There is a fat containing nodule in the left adrenal gland consistent with a myelolipoma, unchanged since August 2015 measuring 2 cm. Right adrenal gland is normal. Kidneys and ureters are normal. The bladder is thick walled with fat stranding adjacent to the bladder. Stomach/Bowel: There is a small hiatal hernia. The stomach is otherwise normal. The small bowel is  unremarkable. The colon is markedly abnormal. There is a stool ball in the distal transverse colon with adjacent colonic wall thickening. There is suggested pneumatosis in the wall of the colon in this location. Wall thickening appears to extend into the more proximal distal half of the transverse colon. There is free air and fat stranding surrounding the colon containing the stool ball. The proximal descending colon appears inflamed which may be secondary to the overall process. Colonic diverticuli are seen throughout the descending and sigmoid colon. The ascending and proximal transverse colon are normal. The appendix is not visualized but there is no secondary evidence of appendicitis. Vascular/Lymphatic: Minimal atherosclerotic changes in the abdominal aorta. No adenopathy. Reproductive: Status post hysterectomy. No adnexal masses. Other: There is some free fluid in the pelvis, likely reactive to either the bladder or colonic pathology. Free air in the abdomen is identified, relatively diffuse in distribution. However, the largest region of free air is adjacent to the abnormal distal transverse colon suggesting this is the location of perforation. No obvious portal venous gas. Musculoskeletal: No acute or significant osseous findings. IMPRESSION: 1. Free air throughout the abdomen consistent with bowel perforation. The largest region of free air is adjacent to the abnormal transverse colon which contains wall thickening and a stool ball. The wall thickening extends over several cm into the distal half of the transverse colon. The proximal descending colon appears inflamed as well which may be secondary to the transverse colon abnormality. I suspect the distal transverse colon in the region of the stool ball is the location of perforation and there is suggested pneumatosis in the wall of the colon in this location. The findings could be due to an infectious, inflammatory, or vascular colitis/process.  Neoplasm/malignancy is not excluded. 2. Thick-walled bladder with adjacent fat stranding suggesting cystitis. Recommend correlation with urinalysis. 3. Left adrenal myelolipoma. 4. Colonic diverticulosis. Findings called to Dr. Thurnell Garbe. Electronically Signed   By: Dorise Bullion III M.D   On: 11/12/2018 14:47   Dg Chest Port 1 View  Result Date: 11/12/2018 CLINICAL DATA:  Renal insufficiency.  Central line placement. EXAM: PORTABLE CHEST 1 VIEW COMPARISON:  Radiograph 04/02/2017 FINDINGS: RIGHT central venous line tip in distal SVC. No pneumothorax. NG tube extends into the stomach. Low lung volumes. Central venous congestion. No pulmonary edema. Lung bases poorly evaluated. IMPRESSION: 1. Central line placement without complication. 2. Low lung volumes. 3. Central venous congestion. Electronically Signed   By: Suzy Bouchard M.D.   On: 11/12/2018  21:55        Scheduled Meds: . abacavir-dolutegravir-lamiVUDine  1 tablet Oral Daily  . chlorhexidine  15 mL Mouth Rinse BID  . docusate sodium  100 mg Oral BID  . fluconazole  150 mg Oral Daily  . fluticasone  2 spray Each Nare QHS  . heparin  5,000 Units Subcutaneous Q8H  . mouth rinse  15 mL Mouth Rinse q12n4p  . mouth rinse  15 mL Mouth Rinse q12n4p  . pantoprazole (PROTONIX) IV  40 mg Intravenous QHS   Continuous Infusions: . lactated ringers 500 mL (11/13/18 1206)  . norepinephrine (LEVOPHED) Adult infusion 15 mcg/min (11/13/18 1200)  . piperacillin-tazobactam (ZOSYN)  IV 3.375 g (11/13/18 0536)  . sodium chloride    . sodium phosphate  Dextrose 5% IVPB 30 mmol (11/13/18 1219)     LOS: 1 day    Critical care: 58mins    Kathie Dike, MD [facility]   If 7PM-7AM, please contact night-coverage [email or web address]  11/13/2018, 12:26 PM

## 2018-11-13 NOTE — Progress Notes (Signed)
[facility] Progress Note  1 Day Post-Op  Subjective: Remained on pressors overnight. Breathing is fine. UOP is adequate with 350 in the bag now. Weaning off the levophed. Wanting to eat but no bowel function.   Objective: Vital signs in last 24 hours: Temp:  [97.1 F (36.2 C)-100.9 F (38.3 C)] 100.9 F (38.3 C) (01/19 1132) Pulse Rate:  [86-166] 93 (01/19 1500) Resp:  [0-37] 21 (01/19 1500) BP: (36-156)/(28-103) 105/83 (01/19 1500) SpO2:  [81 %-100 %] 93 % (01/19 1500) FiO2 (%):  [0 %] 0 % (01/18 1942) Weight:  [91.7 kg-98.1 kg] 98.1 kg (01/19 0615) Last BM Date: 11/11/18  Intake/Output from previous day: 01/18 0701 - 01/19 0700 In: 5709.3 [I.V.:2427.6; IV Piggyback:3281.7] Out: 1200 [Urine:250; Emesis/NG output:650; Blood:50] Intake/Output this shift: Total I/O In: 1980.6 [I.V.:1729.1; IV Piggyback:251.5] Out: 1000 [Emesis/NG output:1000]  General appearance: alert, cooperative and no distress Resp: normal work of breathing GI: soft, mildly distended, ostomy in place, no gas or sweat in the bag, dressing in place, clean Extremities: moving all extremities  Lab Results:  Recent Labs    11/12/18 1240 11/13/18 0240  WBC 8.7 15.5*  HGB 14.1 12.2  HCT 42.2 36.9  PLT 202 238   BMET Recent Labs    11/12/18 1240 11/13/18 0240  NA 139 141  K 4.0 4.8  CL 107 114*  CO2 23 18*  GLUCOSE 157* 159*  BUN 18 30*  CREATININE 1.78* 2.09*  CALCIUM 10.0 8.7*   PT/INR No results for input(s): LABPROT, INR in the last 72 hours.  Studies/Results: Ct Abdomen Pelvis Wo Contrast  Result Date: 11/12/2018 CLINICAL DATA:  Abdominal pain with nausea and vomiting. EXAM: CT ABDOMEN AND PELVIS WITHOUT CONTRAST TECHNIQUE: Multidetector CT imaging of the abdomen and pelvis was performed following the standard protocol without IV contrast. COMPARISON:  May 31, 2014 FINDINGS: Lower chest: Coronary artery calcifications. Small hiatal hernia. Lower chest otherwise  unremarkable. Hepatobiliary: Cholecystectomy. Evaluation of the liver is limited without contrast but no obvious liver masses are noted. Pancreas: Unremarkable. No pancreatic ductal dilatation or surrounding inflammatory changes. Spleen: Normal in size without focal abnormality. Adrenals/Urinary Tract: There is a fat containing nodule in the left adrenal gland consistent with a myelolipoma, unchanged since August 2015 measuring 2 cm. Right adrenal gland is normal. Kidneys and ureters are normal. The bladder is thick walled with fat stranding adjacent to the bladder. Stomach/Bowel: There is a small hiatal hernia. The stomach is otherwise normal. The small bowel is unremarkable. The colon is markedly abnormal. There is a stool ball in the distal transverse colon with adjacent colonic wall thickening. There is suggested pneumatosis in the wall of the colon in this location. Wall thickening appears to extend into the more proximal distal half of the transverse colon. There is free air and fat stranding surrounding the colon containing the stool ball. The proximal descending colon appears inflamed which may be secondary to the overall process. Colonic diverticuli are seen throughout the descending and sigmoid colon. The ascending and proximal transverse colon are normal. The appendix is not visualized but there is no secondary evidence of appendicitis. Vascular/Lymphatic: Minimal atherosclerotic changes in the abdominal aorta. No adenopathy. Reproductive: Status post hysterectomy. No adnexal masses. Other: There is some free fluid in the pelvis, likely reactive to either the bladder or colonic pathology. Free air in the abdomen is identified, relatively diffuse in distribution. However, the largest region of free air is adjacent to the abnormal distal transverse colon suggesting this is  the location of perforation. No obvious portal venous gas. Musculoskeletal: No acute or significant osseous findings. IMPRESSION: 1.  Free air throughout the abdomen consistent with bowel perforation. The largest region of free air is adjacent to the abnormal transverse colon which contains wall thickening and a stool ball. The wall thickening extends over several cm into the distal half of the transverse colon. The proximal descending colon appears inflamed as well which may be secondary to the transverse colon abnormality. I suspect the distal transverse colon in the region of the stool ball is the location of perforation and there is suggested pneumatosis in the wall of the colon in this location. The findings could be due to an infectious, inflammatory, or vascular colitis/process. Neoplasm/malignancy is not excluded. 2. Thick-walled bladder with adjacent fat stranding suggesting cystitis. Recommend correlation with urinalysis. 3. Left adrenal myelolipoma. 4. Colonic diverticulosis. Findings called to Dr. Thurnell Garbe. Electronically Signed   By: Dorise Bullion III M.D   On: 11/12/2018 14:47   Dg Chest Port 1 View  Result Date: 11/12/2018 CLINICAL DATA:  Renal insufficiency.  Central line placement. EXAM: PORTABLE CHEST 1 VIEW COMPARISON:  Radiograph 04/02/2017 FINDINGS: RIGHT central venous line tip in distal SVC. No pneumothorax. NG tube extends into the stomach. Low lung volumes. Central venous congestion. No pulmonary edema. Lung bases poorly evaluated. IMPRESSION: 1. Central line placement without complication. 2. Low lung volumes. 3. Central venous congestion. Electronically Signed   By: Suzy Bouchard M.D.   On: 11/12/2018 21:55    Anti-infectives: Anti-infectives (From admission, onward)   Start     Dose/Rate Route Frequency Ordered Stop   11/13/18 1000  abacavir-dolutegravir-lamiVUDine (TRIUMEQ) 600-50-300 MG per tablet 1 tablet     1 tablet Oral Daily 11/12/18 1936     11/13/18 1000  fluconazole (DIFLUCAN) tablet 150 mg     150 mg Oral Daily 11/12/18 1936     11/12/18 2200  piperacillin-tazobactam (ZOSYN) IVPB 3.375 g      3.375 g 12.5 mL/hr over 240 Minutes Intravenous Every 8 hours 11/12/18 1941 11/16/18 2159   11/12/18 1430  ceFEPIme (MAXIPIME) 2 g in sodium chloride 0.9 % 100 mL IVPB     2 g 200 mL/hr over 30 Minutes Intravenous  Once 11/12/18 1428 11/12/18 1516   11/12/18 1430  metroNIDAZOLE (FLAGYL) IVPB 500 mg     500 mg 100 mL/hr over 60 Minutes Intravenous  Once 11/12/18 1428 11/12/18 1629      Assessment/Plan: [name] is a 78 yo POD 1 s/p Ex lap, partial colectomy with end colostomy from stercoral ulcer. Doing fair.  PRN For pain IS, OOB HD improving, pressor weaning NPO, NG until having bowel function Lytes being replaced, LR running  UOP is adequate, monitor and keep foley Labs tomorrow, Zosyn for bowel perforation until POD 4 SCDs, heparin Will get PT tomorrow to work with patient Remain in ICU while on Pressors    LOS: 1 day    Virl Cagey 11/13/2018

## 2018-11-13 NOTE — Anesthesia Postprocedure Evaluation (Signed)
Anesthesia Post Note  Patient: [name]  Procedure(s) Performed: EXPLORATORY LAPAROTOMY,  colectomy, colostomy (N/A )  Patient location during evaluation: PACU Anesthesia Type: General Level of consciousness: awake Pain management: pain level controlled Respiratory status: spontaneous breathing Postop Assessment: no headache Anesthetic complications: no Comments: Pt started on levophed last HS - prognosis guarded - manage per Dr. B and ICU     Last Vitals:  Vitals:   11/13/18 0615 11/13/18 0728  BP: (!) 111/45   Pulse: 99 97  Resp: (!) 0 (!) 36  Temp:  36.6 C  SpO2: 95% 95%    Last Pain:  Vitals:   11/13/18 0728  TempSrc: Oral  PainSc:                  Lenice Llamas

## 2018-11-14 LAB — CBC WITH DIFFERENTIAL/PLATELET
Abs Immature Granulocytes: 0.23 10*3/uL — ABNORMAL HIGH (ref 0.00–0.07)
Basophils Absolute: 0 10*3/uL (ref 0.0–0.1)
Basophils Relative: 0 %
Eosinophils Absolute: 0.1 10*3/uL (ref 0.0–0.5)
Eosinophils Relative: 0 %
HCT: 27.7 % — ABNORMAL LOW (ref 36.0–46.0)
Hemoglobin: 9.2 g/dL — ABNORMAL LOW (ref 12.0–15.0)
Immature Granulocytes: 2 %
Lymphocytes Relative: 16 %
Lymphs Abs: 2.2 10*3/uL (ref 0.7–4.0)
MCH: 35 pg — ABNORMAL HIGH (ref 26.0–34.0)
MCHC: 33.2 g/dL (ref 30.0–36.0)
MCV: 105.3 fL — AB (ref 80.0–100.0)
Monocytes Absolute: 0.7 10*3/uL (ref 0.1–1.0)
Monocytes Relative: 5 %
NEUTROS PCT: 77 %
Neutro Abs: 10.8 10*3/uL — ABNORMAL HIGH (ref 1.7–7.7)
Platelets: 166 10*3/uL (ref 150–400)
RBC: 2.63 MIL/uL — ABNORMAL LOW (ref 3.87–5.11)
RDW: 13.1 % (ref 11.5–15.5)
WBC Morphology: INCREASED
WBC: 14 10*3/uL — ABNORMAL HIGH (ref 4.0–10.5)
nRBC: 0 % (ref 0.0–0.2)

## 2018-11-14 LAB — BASIC METABOLIC PANEL
Anion gap: 7 (ref 5–15)
BUN: 34 mg/dL — ABNORMAL HIGH (ref 8–23)
CO2: 21 mmol/L — ABNORMAL LOW (ref 22–32)
Calcium: 8.3 mg/dL — ABNORMAL LOW (ref 8.9–10.3)
Chloride: 112 mmol/L — ABNORMAL HIGH (ref 98–111)
Creatinine, Ser: 1.83 mg/dL — ABNORMAL HIGH (ref 0.44–1.00)
GFR calc Af Amer: 30 mL/min — ABNORMAL LOW (ref >60)
GFR calc non Af Amer: 26 mL/min — ABNORMAL LOW (ref >60)
Glucose, Bld: 114 mg/dL — ABNORMAL HIGH (ref 70–99)
POTASSIUM: 4.1 mmol/L (ref 3.5–5.1)
Sodium: 140 mmol/L (ref 135–145)

## 2018-11-14 LAB — URINE CULTURE: Culture: <10000 — AB

## 2018-11-14 LAB — MAGNESIUM: Magnesium: 2 mg/dL (ref 1.7–2.4)

## 2018-11-14 LAB — PHOSPHORUS: Phosphorus: 3 mg/dL (ref 2.5–4.6)

## 2018-11-14 MED ORDER — ALUM & MAG HYDROXIDE-SIMETH 200-200-20 MG/5ML PO SUSP
15.0000 mL | ORAL | Status: DC | PRN
  Administered 2018-11-14 – 2018-11-15 (×2): 15 mL via ORAL
  Filled 2018-11-14 (×2): qty 30

## 2018-11-14 MED ORDER — LORAZEPAM 2 MG/ML IJ SOLN
0.2500 mg | Freq: Two times a day (BID) | INTRAMUSCULAR | Status: DC | PRN
  Administered 2018-11-14 – 2018-11-20 (×8): 0.25 mg via INTRAVENOUS
  Filled 2018-11-14 (×8): qty 1

## 2018-11-14 NOTE — Progress Notes (Signed)
PROGRESS NOTE    [name]  TDD:[id number] DOB: [birth year] DOA: 11/12/2018 PCP: Octavio Graves, DO    Brief Narrative:  78 year old female with a history of HIV, chronic kidney disease stage III, COPD, chronic pain, presents to the hospital with left-sided abdominal pain.  She was found to have perforation of her transverse colon on imaging.  She was admitted to the hospital under general surgery and underwent exploratory laparotomy.  Hospital service was asked to consult on the patient to help with medical management.   Assessment & Plan:   Principal Problem:   Large bowel perforation (HCC) Active Problems:   Human immunodeficiency virus (HIV) disease ([place])   Acute cystitis   HTN (hypertension)   CKD (chronic kidney disease) stage 3, GFR 30-59 ml/min (HCC)   COPD (chronic obstructive pulmonary disease) (HCC)   Hypothyroidism   Obesity (BMI 30-39.9)   Constipation due to opioid therapy   Lactic acidosis   Peritonitis (HCC)   Stercoral ulcer of large intestine   1. Stercoral ulcer with perforation and resultant fecal peritonitis.  Status post exploratory laparotomy, descending colon resection and end colostomy by general surgery.  Patient currently has NG tube in place.  She is on Zosyn.  Defer further postoperative care to general surgery 2. Chronic kidney disease stage III.  Creatinine appears to be stable.  Continue to monitor urine output. 3. Septic shock.  Patient had been hypotensive with increased lactic acid secondary to peritonitis.  She is on IV hydration and was started on norepinephrine infusion.  Lactic acid has since started to trend down.  Blood pressures appear to be improving.  Norepinephrine will likely be weaned off today.  Blood cultures have shown no growth.  We will continue IV antibiotics for now.   4. HIV.  Continue antiretrovirals.  Last viral load checked on 10/31/2018 was undetectable. 5. Hypertension.  Antihypertensives currently on hold while she is  hypotensive on norepinephrine.  Resume beta-blockers when able 6. COPD.  No wheezing on exam.  Continue bronchodilators as needed. 7. Hypothyroidism.  Continue on Synthroid once able to take p.o. 8. Urinary tract infection.  Currently on antibiotics.  Urine culture showing gram-negative rods.  Follow-up further identification.   Subjective: She is been feeling anxious.  Denies any nausea or vomiting.  Abdominal pain is controlled.  Objective: Vitals:   11/14/18 1000 11/14/18 1100 11/14/18 1101 11/14/18 1200  BP: 140/72 112/65 112/65 134/63  Pulse: 83 81  81  Resp: (!) 29 (!) 22  (!) 22  Temp:      TempSrc:      SpO2: 98% 96%  98%  Weight:      Height:        Intake/Output Summary (Last 24 hours) at 11/14/2018 1357 Last data filed at 11/14/2018 1032 Gross per 24 hour  Intake 4192.7 ml  Output 3450 ml  Net 742.7 ml   Filed Weights   11/12/18 1144 11/12/18 1958 11/13/18 0615  Weight: 83.5 kg 91.7 kg 98.1 kg    Examination:  General exam: Alert, awake, oriented x 3, NG tube in place Respiratory system: Clear to auscultation. Respiratory effort normal. Cardiovascular system:RRR. No murmurs, rubs, gallops. Gastrointestinal system: Abdomen is nondistended, soft and nontender. No organomegaly or masses felt. Normal bowel sounds heard.  Colostomy left lower quadrant.  Midline wound, dressing was not removed Central nervous system: Alert and oriented. No focal neurological deficits. Extremities: No C/C/E, +pedal pulses Skin: No rashes, lesions or ulcers Psychiatry: Judgement and insight appear normal.  Mood & affect appropriate.    Data Reviewed: I have personally reviewed following labs and imaging studies  CBC: Recent Labs  Lab 11/12/18 1240 11/13/18 0240 11/14/18 0441  WBC 8.7 15.5* 14.0*  NEUTROABS 4.3 11.3* 10.8*  HGB 14.1 12.2 9.2*  HCT 42.2 36.9 27.7*  MCV 101.0* 103.9* 105.3*  PLT 202 238 412   Basic Metabolic Panel: Recent Labs  Lab 11/12/18 1240  11/13/18 0240 11/14/18 0441  NA 139 141 140  K 4.0 4.8 4.1  CL 107 114* 112*  CO2 23 18* 21*  GLUCOSE 157* 159* 114*  BUN 18 30* 34*  CREATININE 1.78* 2.09* 1.83*  CALCIUM 10.0 8.7* 8.3*  MG  --  1.3* 2.0  PHOS  --  0.7* 3.0   GFR: Estimated Creatinine Clearance: 29.3 mL/min (A) (by C-G formula based on SCr of 1.83 mg/dL (H)). Liver Function Tests: Recent Labs  Lab 11/12/18 1240  AST 24  ALT 20  ALKPHOS 64  BILITOT 1.3*  PROT 6.4*  ALBUMIN 4.1   Recent Labs  Lab 11/12/18 1240  LIPASE 24   No results for input(s): AMMONIA in the last 168 hours. Coagulation Profile: No results for input(s): INR, PROTIME in the last 168 hours. Cardiac Enzymes: No results for input(s): CKTOTAL, CKMB, CKMBINDEX, TROPONINI in the last 168 hours. BNP (last 3 results) No results for input(s): PROBNP in the last 8760 hours. HbA1C: No results for input(s): HGBA1C in the last 72 hours. CBG: No results for input(s): GLUCAP in the last 168 hours. Lipid Profile: No results for input(s): CHOL, HDL, LDLCALC, TRIG, CHOLHDL, LDLDIRECT in the last 72 hours. Thyroid Function Tests: No results for input(s): TSH, T4TOTAL, FREET4, T3FREE, THYROIDAB in the last 72 hours. Anemia Panel: No results for input(s): VITAMINB12, FOLATE, FERRITIN, TIBC, IRON, RETICCTPCT in the last 72 hours. Sepsis Labs: Recent Labs  Lab 11/12/18 1458 11/12/18 2025 11/13/18 0240  LATICACIDVEN 3.7* 5.8* 4.0*    Recent Results (from the past 240 hour(s))  Urine Culture     Status: Abnormal (Preliminary result)   Collection Time: 11/12/18 12:23 PM  Result Value Ref Range Status   Specimen Description   Final    URINE, CATHETERIZED Performed at [facility], [address], [place], [place] [address]    Special Requests   Final    NONE Performed at [facility], [address] [place], [place] [address]    Culture >=100,000 COLONIES/mL ESCHERICHIA COLI (A)  Final   Report Status PENDING  Incomplete  MRSA PCR  Screening     Status: None   Collection Time: 11/12/18  8:00 PM  Result Value Ref Range Status   MRSA by PCR NEGATIVE NEGATIVE Final    Comment:        The GeneXpert MRSA Assay (FDA approved for NASAL specimens only), is one component of a comprehensive MRSA colonization surveillance program. It is not intended to diagnose MRSA infection nor to guide or monitor treatment for MRSA infections. Performed at [facility], [address], [place], [place] [address]   Culture, blood (routine x 2)     Status: None (Preliminary result)   Collection Time: 11/12/18  9:25 PM  Result Value Ref Range Status   Specimen Description A-LINE DRAWN BY RN  Final   Special Requests   Final    BOTTLES DRAWN AEROBIC AND ANAEROBIC Blood Culture adequate volume   Culture   Final    NO GROWTH 2 DAYS Performed at [facility], [address]  [place], [place] [address]    Report Status PENDING  Incomplete  Culture, Urine     Status: Abnormal   Collection Time: 11/12/18  9:30 PM  Result Value Ref Range Status   Specimen Description URINE, CATHETERIZED  Final   Special Requests   Final    NONE Performed at [facility], [address], [place], [place] [address]    Culture <10,000 COLONIES/mL INSIGNIFICANT GROWTH (A)  Final   Report Status 11/14/2018 FINAL  Final  Culture, blood (routine x 2)     Status: None (Preliminary result)   Collection Time: 11/12/18  9:37 PM  Result Value Ref Range Status   Specimen Description BLOOD LEFT HAND  Final   Special Requests   Final    BOTTLES DRAWN AEROBIC AND ANAEROBIC Blood Culture adequate volume   Culture   Final    NO GROWTH 2 DAYS Performed at [facility], [address], [place], [place] [address]    Report Status PENDING  Incomplete         Radiology Studies: Ct Abdomen Pelvis Wo Contrast  Result Date: 11/12/2018 CLINICAL DATA:  Abdominal pain with nausea and vomiting. EXAM: CT ABDOMEN AND PELVIS WITHOUT CONTRAST TECHNIQUE: Multidetector CT  imaging of the abdomen and pelvis was performed following the standard protocol without IV contrast. COMPARISON:  May 31, 2014 FINDINGS: Lower chest: Coronary artery calcifications. Small hiatal hernia. Lower chest otherwise unremarkable. Hepatobiliary: Cholecystectomy. Evaluation of the liver is limited without contrast but no obvious liver masses are noted. Pancreas: Unremarkable. No pancreatic ductal dilatation or surrounding inflammatory changes. Spleen: Normal in size without focal abnormality. Adrenals/Urinary Tract: There is a fat containing nodule in the left adrenal gland consistent with a myelolipoma, unchanged since August 2015 measuring 2 cm. Right adrenal gland is normal. Kidneys and ureters are normal. The bladder is thick walled with fat stranding adjacent to the bladder. Stomach/Bowel: There is a small hiatal hernia. The stomach is otherwise normal. The small bowel is unremarkable. The colon is markedly abnormal. There is a stool ball in the distal transverse colon with adjacent colonic wall thickening. There is suggested pneumatosis in the wall of the colon in this location. Wall thickening appears to extend into the more proximal distal half of the transverse colon. There is free air and fat stranding surrounding the colon containing the stool ball. The proximal descending colon appears inflamed which may be secondary to the overall process. Colonic diverticuli are seen throughout the descending and sigmoid colon. The ascending and proximal transverse colon are normal. The appendix is not visualized but there is no secondary evidence of appendicitis. Vascular/Lymphatic: Minimal atherosclerotic changes in the abdominal aorta. No adenopathy. Reproductive: Status post hysterectomy. No adnexal masses. Other: There is some free fluid in the pelvis, likely reactive to either the bladder or colonic pathology. Free air in the abdomen is identified, relatively diffuse in distribution. However, the largest  region of free air is adjacent to the abnormal distal transverse colon suggesting this is the location of perforation. No obvious portal venous gas. Musculoskeletal: No acute or significant osseous findings. IMPRESSION: 1. Free air throughout the abdomen consistent with bowel perforation. The largest region of free air is adjacent to the abnormal transverse colon which contains wall thickening and a stool ball. The wall thickening extends over several cm into the distal half of the transverse colon. The proximal descending colon appears inflamed as well which may be secondary to the transverse colon abnormality. I suspect the distal transverse colon in the  region of the stool ball is the location of perforation and there is suggested pneumatosis in the wall of the colon in this location. The findings could be due to an infectious, inflammatory, or vascular colitis/process. Neoplasm/malignancy is not excluded. 2. Thick-walled bladder with adjacent fat stranding suggesting cystitis. Recommend correlation with urinalysis. 3. Left adrenal myelolipoma. 4. Colonic diverticulosis. Findings called to Dr. Thurnell Garbe. Electronically Signed   By: Dorise Bullion III M.D   On: 11/12/2018 14:47   Dg Chest Port 1 View  Result Date: 11/12/2018 CLINICAL DATA:  Renal insufficiency.  Central line placement. EXAM: PORTABLE CHEST 1 VIEW COMPARISON:  Radiograph 04/02/2017 FINDINGS: RIGHT central venous line tip in distal SVC. No pneumothorax. NG tube extends into the stomach. Low lung volumes. Central venous congestion. No pulmonary edema. Lung bases poorly evaluated. IMPRESSION: 1. Central line placement without complication. 2. Low lung volumes. 3. Central venous congestion. Electronically Signed   By: Suzy Bouchard M.D.   On: 11/12/2018 21:55        Scheduled Meds: . abacavir-dolutegravir-lamiVUDine  1 tablet Oral Daily  . chlorhexidine  15 mL Mouth Rinse BID  . docusate sodium  100 mg Oral BID  . fluconazole  150 mg  Oral Daily  . fluticasone  2 spray Each Nare QHS  . heparin  5,000 Units Subcutaneous Q8H  . mouth rinse  15 mL Mouth Rinse q12n4p  . mouth rinse  15 mL Mouth Rinse q12n4p  . pantoprazole (PROTONIX) IV  40 mg Intravenous QHS   Continuous Infusions: . lactated ringers 100 mL/hr at 11/14/18 1031  . norepinephrine (LEVOPHED) Adult infusion Stopped (11/14/18 0859)  . piperacillin-tazobactam (ZOSYN)  IV 3.375 g (11/14/18 1219)  . sodium chloride       LOS: 2 days    Critical care: 72mins    Kathie Dike, MD [facility]   If 7PM-7AM, please contact night-coverage [email or web address]  11/14/2018, 1:57 PM

## 2018-11-14 NOTE — Progress Notes (Signed)
MEDICATION RELATED CONSULT NOTE - INITIAL   Pharmacy Consult for electrolyte replacement Indication: abnormal electrolytes   Allergies  Allergen Reactions  . Bee Venom Anaphylaxis  . Promethazine Swelling  . Tenofovir Disoproxil Fumarate [Tenofovir Disoproxil Fumarate] Other (See Comments)    Renal failure, (08/27/2006)  . Compazine  [Prochlorperazine Edisylate] Nausea And Vomiting  . Haloperidol Lactate Other (See Comments)    Reaction is muscle tension, causes severe spasms in face and neck. Forces eyes to roll in the back of the head  . Brompheniramine-Acetaminophen   . Chlorcyclizine   . Chlorpromazine   . Codeine Itching and Nausea And Vomiting  . Navane [Thiothixene] Other (See Comments) and Nausea And Vomiting    Same muscle spasm reaction as haldol  . Other     NOVACAINE  . Prednisone     Brief mild psychosis and agitation  . Prochlorperazine Nausea And Vomiting  . Promethazine Hcl Other (See Comments)    Same as haldol   . Propoxyphene N-Acetaminophen Itching and Nausea And Vomiting  . Morphine Itching and Swelling    Patient Measurements: Height: 5\' 4"  (162.6 cm) Weight: 216 lb 4.3 oz (98.1 kg) IBW/kg (Calculated) : 54.7   Vital Signs: Temp: 98.2 F (36.8 C) (01/20 0741) Temp Source: Axillary (01/20 0741) BP: 129/46 (01/20 0800) Pulse Rate: 85 (01/20 0800) Intake/Output from previous day: 01/19 0701 - 01/20 0700 In: 4242.7 [I.V.:3780.9; IV Piggyback:461.8] Out: 5027 [Urine:975; Emesis/NG output:2450] Intake/Output from this shift: No intake/output data recorded.  Labs: Recent Labs    11/12/18 1240 11/13/18 0240 11/14/18 0441  WBC 8.7 15.5* 14.0*  HGB 14.1 12.2 9.2*  HCT 42.2 36.9 27.7*  PLT 202 238 166  CREATININE 1.78* 2.09* 1.83*  MG  --  1.3* 2.0  PHOS  --  0.7* 3.0  ALBUMIN 4.1  --   --   PROT 6.4*  --   --   AST 24  --   --   ALT 20  --   --   ALKPHOS 64  --   --   BILITOT 1.3*  --   --    Estimated Creatinine Clearance: 29.3 mL/min  (A) (by C-G formula based on SCr of 1.83 mg/dL (H)).   Microbiology: Recent Results (from the past 720 hour(s))  Urine Culture     Status: Abnormal (Preliminary result)   Collection Time: 11/12/18 12:23 PM  Result Value Ref Range Status   Specimen Description URINE, CATHETERIZED  Final   Special Requests   Final    NONE Performed at [facility], [address] [place], [place] [address]    Culture >=100,000 COLONIES/mL GRAM NEGATIVE RODS (A)  Final   Report Status PENDING  Incomplete  MRSA PCR Screening     Status: None   Collection Time: 11/12/18  8:00 PM  Result Value Ref Range Status   MRSA by PCR NEGATIVE NEGATIVE Final    Comment:        The GeneXpert MRSA Assay (FDA approved for NASAL specimens only), is one component of a comprehensive MRSA colonization surveillance program. It is not intended to diagnose MRSA infection nor to guide or monitor treatment for MRSA infections. Performed at [facility], [address], [place], [place] [address]   Culture, blood (routine x 2)     Status: None (Preliminary result)   Collection Time: 11/12/18  9:25 PM  Result Value Ref Range Status   Specimen Description A-LINE  Final   Special Requests   Final  BOTTLES DRAWN AEROBIC AND ANAEROBIC Blood Culture adequate volume   Culture   Final    NO GROWTH 2 DAYS Performed at [facility], [address], [place], [place] [address]    Report Status PENDING  Incomplete  Culture, blood (routine x 2)     Status: None (Preliminary result)   Collection Time: 11/12/18  9:37 PM  Result Value Ref Range Status   Specimen Description BLOOD LEFT HAND  Final   Special Requests   Final    BOTTLES DRAWN AEROBIC AND ANAEROBIC Blood Culture adequate volume   Culture   Final    NO GROWTH 2 DAYS Performed at [facility], [address], [place], [place] [address]    Report Status PENDING  Incomplete    Medical History: Past Medical History:  Diagnosis Date  . Anxiety   . Arthritis   .  Bronchitis   . CKD (chronic kidney disease) stage 3, GFR 30-59 ml/min (HCC) 05/04/2016  . COPD (chronic obstructive pulmonary disease) ([place])   . Depression   . Elevated LFTs 05/04/2016  . History of TIAs 1981   left side weakness  . HIV (human immunodeficiency virus infection) ([place])   . Hypertension   . Kidney disease related to HIV infection [facility])    pt states she has to have her creatinine level checked often   . Nausea 03/03/2017  . Peripheral neuropathy    bilateral feet  . Sinusitis 05/06/2018  . UTI (urinary tract infection) 03/03/2017  . Weight loss 03/03/2017    Medications:  Medications Prior to Admission  Medication Sig Dispense Refill Last Dose  . albuterol (PROVENTIL HFA;VENTOLIN HFA) 108 (90 Base) MCG/ACT inhaler Inhale 1-2 puffs into the lungs every 6 (six) hours as needed for wheezing or shortness of breath. 1 Inhaler 0 11/12/2018 at 0830  . amitriptyline (ELAVIL) 50 MG tablet Take 50 mg by mouth at bedtime.    11/11/2018 at Unknown time  . cephALEXin (KEFLEX) 500 MG capsule Take 1 capsule (500 mg total) by mouth 3 (three) times daily. 21 capsule 0 11/12/2018 at 0830  . Cholecalciferol (VITAMIN D3) 10000 units TABS Take 1 tablet by mouth daily.   11/11/2018 at Unknown time  . diphenhydrAMINE (BENADRYL) 25 mg capsule Take 25 mg by mouth daily as needed for itching (itching from hydrocodone).   Past Month at Unknown time  . fluconazole (DIFLUCAN) 200 MG tablet Take 1 tablet (200 mg total) by mouth daily. 14 tablet 0 11/10/2018 at Unknown time  . fluticasone (FLONASE) 50 MCG/ACT nasal spray Place 2 sprays into both nostrils at bedtime. 16 g 8 11/12/2018 at 0830  . furosemide (LASIX) 40 MG tablet Take 40 mg by mouth daily as needed for fluid.    Past Week at 0830  . guaiFENesin (MUCINEX) 600 MG 12 hr tablet Take 1 tablet (600 mg total) by mouth 2 (two) times daily. 30 tablet 0 Past Week at Unknown time  . ipratropium-albuterol (DUONEB) 0.5-2.5 (3) MG/3ML SOLN Inhale 3 mLs into the lungs  every 6 (six) hours as needed (shortness of breath).    Past Week at Unknown time  . Multiple Vitamin (ONE-A-DAY 55 PLUS PO) Take 1 tablet by mouth daily.   Past Month at Unknown time  . nebivolol (BYSTOLIC) 10 MG tablet Take 10 mg by mouth daily.    11/12/2018 at 0830  . ondansetron (ZOFRAN) 8 MG tablet Take 8 mg by mouth daily as needed for nausea or vomiting.    11/11/2018 at Unknown  time  . Oxycodone HCl 10 MG TABS Take 10 mg by mouth 4 (four) times daily as needed. For pain   11/11/2018 at 0830  . phenazopyridine (PYRIDIUM) 200 MG tablet Take 1 tablet (200 mg total) by mouth 3 (three) times daily as needed for pain. 9 tablet 0 11/12/2018 at 0700  . polyvinyl alcohol (LIQUIFILM TEARS) 1.4 % ophthalmic solution Place 1 drop into both eyes as needed for dry eyes.   11/11/2018 at Unknown time  . PREMARIN vaginal cream Place 1 Applicatorful vaginally at bedtime.    Past Week at Unknown time  . thiamine (VITAMIN B-1) 100 MG tablet Take 100 mg by mouth daily.   11/12/2018 at 0830  . TRIUMEQ 600-50-300 MG tablet TAKE 1 TABLET ONCE DAILY. 30 tablet 1 11/12/2018 at 0830  . LORazepam (ATIVAN) 0.5 MG tablet Take 0.5 mg by mouth 2 (two) times daily.    11/11/2018    Assessment: Pharmacy consulted to replace electrolytes in patient admitted for hypotension, septic shock from fecal peritonitis.  Patient is 2 days postop now.  Goal of Therapy:  Replace electrolytes   Plan:  Potassium, magnesium and phosphorus currently at goal today. Monitor electrolytes in AM  Pascuala Klutts C Shawnice Tilmon 11/14/2018,9:07 AM

## 2018-11-14 NOTE — Progress Notes (Signed)
[facility] Progress Note  2 Days Post-Op  Subjective: Looking well. Weaning off pressors. UOP picking up. No complaints but wants something to drink. NG output high. No output from ostomy.   Objective: Vital signs in last 24 hours: Temp:  [97.4 F (36.3 C)-100.8 F (38.2 C)] 98.2 F (36.8 C) (01/20 0741) Pulse Rate:  [80-100] 81 (01/20 1200) Resp:  [16-42] 22 (01/20 1200) BP: (70-169)/(36-134) 134/63 (01/20 1200) SpO2:  [88 %-100 %] 98 % (01/20 1200) Last BM Date: 11/11/18  Intake/Output from previous day: 01/19 0701 - 01/20 0700 In: 4242.7 [I.V.:3780.9; IV Piggyback:461.8] Out: 3425 [Urine:975; Emesis/NG output:2450] Intake/Output this shift: Total I/O In: -  Out: 1025 [Urine:625; Emesis/NG output:400]  General appearance: alert, cooperative and no distress Resp: normal work breathing GI: soft, mildly distended, appropriately tender, dressing in place, ostomy without gas in bag, pink Extremities: extremities normal, atraumatic, no cyanosis or edema  Lab Results:  Recent Labs    11/13/18 0240 11/14/18 0441  WBC 15.5* 14.0*  HGB 12.2 9.2*  HCT 36.9 27.7*  PLT 238 166   BMET Recent Labs    11/13/18 0240 11/14/18 0441  NA 141 140  K 4.8 4.1  CL 114* 112*  CO2 18* 21*  GLUCOSE 159* 114*  BUN 30* 34*  CREATININE 2.09* 1.83*  CALCIUM 8.7* 8.3*    Studies/Results: Dg Chest Port 1 View  Result Date: 11/12/2018 CLINICAL DATA:  Renal insufficiency.  Central line placement. EXAM: PORTABLE CHEST 1 VIEW COMPARISON:  Radiograph 04/02/2017 FINDINGS: RIGHT central venous line tip in distal SVC. No pneumothorax. NG tube extends into the stomach. Low lung volumes. Central venous congestion. No pulmonary edema. Lung bases poorly evaluated. IMPRESSION: 1. Central line placement without complication. 2. Low lung volumes. 3. Central venous congestion. Electronically Signed   By: Suzy Bouchard M.D.   On: 11/12/2018 21:55     Anti-infectives: Anti-infectives (From admission, onward)   Start     Dose/Rate Route Frequency Ordered Stop   11/13/18 1000  abacavir-dolutegravir-lamiVUDine (TRIUMEQ) 600-50-300 MG per tablet 1 tablet     1 tablet Oral Daily 11/12/18 1936     11/13/18 1000  fluconazole (DIFLUCAN) tablet 150 mg     150 mg Oral Daily 11/12/18 1936     11/12/18 2200  piperacillin-tazobactam (ZOSYN) IVPB 3.375 g     3.375 g 12.5 mL/hr over 240 Minutes Intravenous Every 8 hours 11/12/18 1941 11/16/18 2159   11/12/18 1430  ceFEPIme (MAXIPIME) 2 g in sodium chloride 0.9 % 100 mL IVPB     2 g 200 mL/hr over 30 Minutes Intravenous  Once 11/12/18 1428 11/12/18 1516   11/12/18 1430  metroNIDAZOLE (FLAGYL) IVPB 500 mg     500 mg 100 mL/hr over 60 Minutes Intravenous  Once 11/12/18 1428 11/12/18 1629      Assessment/Plan: [name] is a 78 yo POD 2 s/p Ex lap, partial colectomy with end colostomy for what is likely a stercoral ulcer. Doing well.  PRN for pain IS, Up to chair position NPO and NG in place, can hold suction for oral HIV meds HD improving, pressor weaning off Labs with H&H drifting down, monitor, no signs of bleeding Zosyn for intraabdominal infection until POD 4  SCDs, heparin sq  Lytes much better, LR @ 100 In ICU today until off pressors 24 hr   LOS: 2 days    Virl Cagey 11/14/2018

## 2018-11-14 NOTE — Progress Notes (Signed)
Changed daily post op incision on abdomen. Pt was pre medicated for pain before removing dry dressing. All past dressing came out dry and intact. Wound looked red and subcutaneous fat was visible. There was no signs of infection and pt reported no pain. Wound looked overall healthy. Daughter in law and son was at the bed side and helped with dressing changed and verbalized understanding.   Jeris Penta, RN

## 2018-11-14 NOTE — Care Management Note (Addendum)
Case Management Note  Patient Details  Name: [name] MRN: [id number] Date of Birth: 08/11/1941  Subjective/Objective:     s/p Ex lap, partial colectomy with end colostomy .  Plans to go live with her daughter in law [name]) at time of DC. [name] will be with patient 24/7.  [name] has been involved with learning colostomy care.  [place] nurse consulted and has met with patient and family. Patient has been enrolled in [place] program for pouches.  Discussed home health with [name]. She is agreeable. No preference of agency.  Will make referral to [facility].   Patient has RW and BSC at home pta.                  Action/Plan: DC home with family and home health RN and PT.   Expected Discharge Date:     unk             Expected Discharge Plan:  [place]  In-House Referral:     Discharge planning Services  CM Consult  Post Acute Care Choice:  Home Health Choice offered to:  Patient, Adult Children  DME Arranged:    DME Agency:     HH Arranged:  RN, PT [place] Agency:  [place]  Status of Service:  Completed, signed off  If discussed at [place] of Stay Meetings, dates discussed:    Additional Comments:  Chrishelle Zito, Chauncey [place], RN 11/14/2018, 1:36 PM

## 2018-11-14 NOTE — Consult Note (Signed)
[place] Nurse ostomy consult note Stoma type/location: LLQ Colostomy .  Daughter in law at bedside and observes this pouch change today.  Stomal assessment/size: 1 1/2" pale pink and moist, friable mucosal tissue, edematous.  POuch cut off center to accommodate midline surgical incision Peristomal assessment: intact Treatment options for stomal/peristomal skin: barrier ring Output none  Blood tinged liquid only Ostomy pouching: 1pc.flat with barrier ring Education provided: Daughter at bedside and observed pouch change  Stated she will be helping patient at home.  Discussed twice weekly pouch changes, emptying when 1/3 full, showering and activity level.  Agrees to perform next pouch change Wednesday.  Enrolled patient in [place] program: No will Wednesday if pouch system appropriate.  [place] team will follow.  Domenic Moras MSN, RN, FNP-BC CWON Wound, Ostomy, Continence Nurse Pager [phone]

## 2018-11-15 ENCOUNTER — Encounter (HOSPITAL_COMMUNITY): Payer: Self-pay | Admitting: General Surgery

## 2018-11-15 ENCOUNTER — Encounter: Payer: Medicare HMO | Admitting: Infectious Disease

## 2018-11-15 ENCOUNTER — Inpatient Hospital Stay (HOSPITAL_COMMUNITY): Payer: Medicare HMO

## 2018-11-15 DIAGNOSIS — B20 Human immunodeficiency virus [HIV] disease: Secondary | ICD-10-CM

## 2018-11-15 DIAGNOSIS — E872 Acidosis: Secondary | ICD-10-CM

## 2018-11-15 DIAGNOSIS — N3000 Acute cystitis without hematuria: Secondary | ICD-10-CM

## 2018-11-15 DIAGNOSIS — T402X5A Adverse effect of other opioids, initial encounter: Secondary | ICD-10-CM

## 2018-11-15 DIAGNOSIS — K5903 Drug induced constipation: Secondary | ICD-10-CM

## 2018-11-15 DIAGNOSIS — J449 Chronic obstructive pulmonary disease, unspecified: Secondary | ICD-10-CM

## 2018-11-15 DIAGNOSIS — N183 Chronic kidney disease, stage 3 (moderate): Secondary | ICD-10-CM

## 2018-11-15 DIAGNOSIS — E039 Hypothyroidism, unspecified: Secondary | ICD-10-CM

## 2018-11-15 DIAGNOSIS — K659 Peritonitis, unspecified: Secondary | ICD-10-CM

## 2018-11-15 LAB — BASIC METABOLIC PANEL
Anion gap: 7 (ref 5–15)
BUN: 29 mg/dL — ABNORMAL HIGH (ref 8–23)
CO2: 22 mmol/L (ref 22–32)
Calcium: 8.8 mg/dL — ABNORMAL LOW (ref 8.9–10.3)
Chloride: 113 mmol/L — ABNORMAL HIGH (ref 98–111)
Creatinine, Ser: 1.44 mg/dL — ABNORMAL HIGH (ref 0.44–1.00)
GFR calc Af Amer: 40 mL/min — ABNORMAL LOW (ref >60)
GFR calc non Af Amer: 35 mL/min — ABNORMAL LOW (ref >60)
Glucose, Bld: 97 mg/dL (ref 70–99)
Potassium: 4.3 mmol/L (ref 3.5–5.1)
Sodium: 142 mmol/L (ref 135–145)

## 2018-11-15 LAB — CBC WITH DIFFERENTIAL/PLATELET
Abs Immature Granulocytes: 0.06 10*3/uL (ref 0.00–0.07)
Basophils Absolute: 0 10*3/uL (ref 0.0–0.1)
Basophils Relative: 0 %
Eosinophils Absolute: 0.2 10*3/uL (ref 0.0–0.5)
Eosinophils Relative: 2 %
HCT: 26.9 % — ABNORMAL LOW (ref 36.0–46.0)
HEMOGLOBIN: 8.7 g/dL — AB (ref 12.0–15.0)
Immature Granulocytes: 1 %
LYMPHS PCT: 14 %
Lymphs Abs: 1.6 10*3/uL (ref 0.7–4.0)
MCH: 34.3 pg — ABNORMAL HIGH (ref 26.0–34.0)
MCHC: 32.3 g/dL (ref 30.0–36.0)
MCV: 105.9 fL — ABNORMAL HIGH (ref 80.0–100.0)
Monocytes Absolute: 0.5 10*3/uL (ref 0.1–1.0)
Monocytes Relative: 4 %
Neutro Abs: 8.5 10*3/uL — ABNORMAL HIGH (ref 1.7–7.7)
Neutrophils Relative %: 79 %
Platelets: 146 10*3/uL — ABNORMAL LOW (ref 150–400)
RBC: 2.54 MIL/uL — ABNORMAL LOW (ref 3.87–5.11)
RDW: 13.2 % (ref 11.5–15.5)
WBC: 10.8 10*3/uL — ABNORMAL HIGH (ref 4.0–10.5)
nRBC: 0 % (ref 0.0–0.2)

## 2018-11-15 LAB — URINE CULTURE: Culture: >=100000 — AB

## 2018-11-15 LAB — MAGNESIUM: Magnesium: 2 mg/dL (ref 1.7–2.4)

## 2018-11-15 LAB — PHOSPHORUS: Phosphorus: 2.5 mg/dL (ref 2.5–4.6)

## 2018-11-15 MED ORDER — SODIUM CHLORIDE 0.9% FLUSH
10.0000 mL | Freq: Two times a day (BID) | INTRAVENOUS | Status: DC
  Administered 2018-11-16 – 2018-11-19 (×7): 10 mL

## 2018-11-15 MED ORDER — SODIUM CHLORIDE 0.9% FLUSH: 10.0000 mL | INTRAVENOUS | Status: DC | PRN

## 2018-11-15 MED ORDER — FUROSEMIDE 10 MG/ML IJ SOLN
40.0000 mg | Freq: Once | INTRAMUSCULAR | Status: AC
  Administered 2018-11-15: 40 mg via INTRAVENOUS
  Filled 2018-11-15: qty 4

## 2018-11-15 MED ORDER — METOPROLOL TARTRATE 5 MG/5ML IV SOLN
INTRAVENOUS | Status: AC
  Filled 2018-11-15: qty 5

## 2018-11-15 MED ORDER — CHLORHEXIDINE GLUCONATE CLOTH 2 % EX PADS
6.0000 | MEDICATED_PAD | Freq: Every day | CUTANEOUS | Status: DC
  Administered 2018-11-15 – 2018-11-18 (×4): 6 via TOPICAL

## 2018-11-15 MED ORDER — METOPROLOL TARTRATE 5 MG/5ML IV SOLN
5.0000 mg | Freq: Once | INTRAVENOUS | Status: AC
  Administered 2018-11-15: 5 mg via INTRAVENOUS

## 2018-11-15 MED ORDER — IPRATROPIUM-ALBUTEROL 0.5-2.5 (3) MG/3ML IN SOLN
3.0000 mL | Freq: Three times a day (TID) | RESPIRATORY_TRACT | Status: DC
  Administered 2018-11-15 – 2018-11-16 (×3): 3 mL via RESPIRATORY_TRACT
  Filled 2018-11-15 (×5): qty 3

## 2018-11-15 MED ORDER — DILTIAZEM HCL 25 MG/5ML IV SOLN
10.0000 mg | Freq: Once | INTRAVENOUS | Status: AC
  Administered 2018-11-15: 10 mg via INTRAVENOUS
  Filled 2018-11-15: qty 5

## 2018-11-15 MED ORDER — IPRATROPIUM-ALBUTEROL 0.5-2.5 (3) MG/3ML IN SOLN
3.0000 mL | RESPIRATORY_TRACT | Status: DC
  Administered 2018-11-15: 3 mL via RESPIRATORY_TRACT
  Filled 2018-11-15: qty 3

## 2018-11-15 MED ORDER — DILTIAZEM HCL 100 MG IV SOLR
5.0000 mg/h | INTRAVENOUS | Status: DC
  Administered 2018-11-15: 5 mg/h via INTRAVENOUS
  Administered 2018-11-16: 2.5 mg/h via INTRAVENOUS
  Filled 2018-11-15 (×2): qty 100

## 2018-11-15 MED ORDER — SODIUM PHOSPHATES 45 MMOLE/15ML IV SOLN
10.0000 mmol | Freq: Once | INTRAVENOUS | Status: AC
  Administered 2018-11-15: 10 mmol via INTRAVENOUS
  Filled 2018-11-15: qty 3.33

## 2018-11-15 MED ORDER — NEBIVOLOL HCL 10 MG PO TABS
10.0000 mg | ORAL_TABLET | Freq: Every day | ORAL | Status: DC
  Administered 2018-11-15 – 2018-11-21 (×7): 10 mg via ORAL
  Filled 2018-11-15 (×7): qty 1

## 2018-11-15 NOTE — Progress Notes (Signed)
**Note De-Identified  Obfuscation** EKG complete, RN notified

## 2018-11-15 NOTE — Progress Notes (Signed)
PROGRESS NOTE   [name]  GQB:[id number] DOB: 14-Jul-1941 DOA: 11/12/2018 PCP: Octavio Graves, DO    Brief Narrative:  78 year old female with a history of HIV, chronic kidney disease stage III, COPD, chronic pain, presents to the hospital with left-sided abdominal pain.  She was found to have perforation of her transverse colon on imaging.  She was admitted to the hospital under general surgery and underwent exploratory laparotomy.  Hospital service was asked to consult on the patient to help with medical management.  Assessment & Plan:   Principal Problem:   Large bowel perforation (HCC) Active Problems:   Human immunodeficiency virus (HIV) disease ([place])   Acute cystitis   HTN (hypertension)   CKD (chronic kidney disease) stage 3, GFR 30-59 ml/min (HCC)   COPD (chronic obstructive pulmonary disease) (HCC)   Hypothyroidism   Obesity (BMI 30-39.9)   Constipation due to opioid therapy   Lactic acidosis   Peritonitis (HCC)   Stercoral ulcer of large intestine  1. Stercoral ulcer with perforation and resultant fecal peritonitis.  Status post exploratory laparotomy, descending colon resection and end colostomy by general surgery.  Patient currently has NG tube in place.  She is on Zosyn.  Defer further postoperative care to general surgery.  2. Acute respiratory distress - CXR with mild pulm edema, reduce IVFs, Lasix 40 mg IV ordered.  Repeat CXR in AM. Schedule Duonebs for wheezing.   3. Chronic kidney disease stage III.  Creatinine appears to be stable.  Continue to monitor urine output. 4. Septic shock.  Patient had been hypotensive with increased lactic acid secondary to peritonitis.  She is on IV hydration and was started on norepinephrine infusion.  Lactic acid has since started to trend down.  Blood pressures appear to be improving.  Norepinephrine will likely be weaned off today.  Blood cultures have shown no growth.  We will continue IV antibiotics for now.   5. HIV.   Continue antiretrovirals.  Last viral load checked on 10/31/2018 was undetectable. 6. Hypertension.  Antihypertensives currently on hold while she is hypotensive on norepinephrine.  Resume beta-blockers when able 7. COPD.  No wheezing on exam.  Continue bronchodilators as needed. 8. Hypothyroidism.  Continue on Synthroid once able to take p.o. 9. Urinary tract infection.  Currently on antibiotics.  Urine culture showing gram-negative rods.  Follow-up further identification.   Subjective: Pt is coughing and wheezing and mild SOB this morning.  No flatus.    Objective: Vitals:   11/15/18 0800 11/15/18 0804 11/15/18 0845 11/15/18 0900  BP: (!) 161/89  140/81 104/83  Pulse: 73  77 74  Resp: (!) 25  (!) 25 (!) 28  Temp:  97.8 F (36.6 C)    TempSrc:  Axillary    SpO2: 100%  94% 100%  Weight:      Height:        Intake/Output Summary (Last 24 hours) at 11/15/2018 0933 Last data filed at 11/15/2018 0500 Gross per 24 hour  Intake 2213.12 ml  Output 2475 ml  Net -261.88 ml   Filed Weights   11/12/18 1958 11/13/18 0615 11/15/18 0500  Weight: 91.7 kg 98.1 kg 91.5 kg    Examination:  General exam: Alert, awake, oriented x 3, NG tube in place Respiratory system: diffuse wheezing heard bilateral.  Cardiovascular system: normal s1,s2 sounds. No murmurs, rubs, gallops. Gastrointestinal system: Abdomen is nondistended, soft and nontender. No organomegaly or masses felt. Normal bowel sounds heard.  Colostomy left lower quadrant.  Midline wound,  dressing was not removed Central nervous system: Alert and oriented. No focal neurological deficits. Extremities: No C/C/E, +pedal pulses Skin: No rashes, lesions or ulcers Psychiatry: Judgement and insight appear normal. Mood & affect appropriate.    Data Reviewed: I have personally reviewed following labs and imaging studies  CBC: Recent Labs  Lab 11/12/18 1240 11/13/18 0240 11/14/18 0441 11/15/18 0447  WBC 8.7 15.5* 14.0* 10.8*    NEUTROABS 4.3 11.3* 10.8* 8.5*  HGB 14.1 12.2 9.2* 8.7*  HCT 42.2 36.9 27.7* 26.9*  MCV 101.0* 103.9* 105.3* 105.9*  PLT 202 238 166 161*   Basic Metabolic Panel: Recent Labs  Lab 11/12/18 1240 11/13/18 0240 11/14/18 0441 11/15/18 0447  NA 139 141 140 142  K 4.0 4.8 4.1 4.3  CL 107 114* 112* 113*  CO2 23 18* 21* 22  GLUCOSE 157* 159* 114* 97  BUN 18 30* 34* 29*  CREATININE 1.78* 2.09* 1.83* 1.44*  CALCIUM 10.0 8.7* 8.3* 8.8*  MG  --  1.3* 2.0 2.0  PHOS  --  0.7* 3.0 2.5   GFR: Estimated Creatinine Clearance: 35.8 mL/min (A) (by C-G formula based on SCr of 1.44 mg/dL (H)). Liver Function Tests: Recent Labs  Lab 11/12/18 1240  AST 24  ALT 20  ALKPHOS 64  BILITOT 1.3*  PROT 6.4*  ALBUMIN 4.1   Recent Labs  Lab 11/12/18 1240  LIPASE 24   No results for input(s): AMMONIA in the last 168 hours. Coagulation Profile: No results for input(s): INR, PROTIME in the last 168 hours. Cardiac Enzymes: No results for input(s): CKTOTAL, CKMB, CKMBINDEX, TROPONINI in the last 168 hours. BNP (last 3 results) No results for input(s): PROBNP in the last 8760 hours. HbA1C: No results for input(s): HGBA1C in the last 72 hours. CBG: No results for input(s): GLUCAP in the last 168 hours. Lipid Profile: No results for input(s): CHOL, HDL, LDLCALC, TRIG, CHOLHDL, LDLDIRECT in the last 72 hours. Thyroid Function Tests: No results for input(s): TSH, T4TOTAL, FREET4, T3FREE, THYROIDAB in the last 72 hours. Anemia Panel: No results for input(s): VITAMINB12, FOLATE, FERRITIN, TIBC, IRON, RETICCTPCT in the last 72 hours. Sepsis Labs: Recent Labs  Lab 11/12/18 1458 11/12/18 2025 11/13/18 0240  LATICACIDVEN 3.7* 5.8* 4.0*    Recent Results (from the past 240 hour(s))  Urine Culture     Status: Abnormal   Collection Time: 11/12/18 12:23 PM  Result Value Ref Range Status   Specimen Description   Final    URINE, CATHETERIZED Performed at [facility], [address]  [place], [place] [address]    Special Requests   Final    NONE Performed at [facility], [address] [place], [place] [address]    Culture >=100,000 COLONIES/mL ESCHERICHIA COLI (A)  Final   Report Status 11/15/2018 FINAL  Final   Organism ID, Bacteria ESCHERICHIA COLI (A)  Final      Susceptibility   Escherichia coli - MIC*    AMPICILLIN <=2 SENSITIVE Sensitive     CEFAZOLIN <=4 SENSITIVE Sensitive     CEFTRIAXONE <=1 SENSITIVE Sensitive     CIPROFLOXACIN <=0.25 SENSITIVE Sensitive     GENTAMICIN <=1 SENSITIVE Sensitive     IMIPENEM <=0.25 SENSITIVE Sensitive     NITROFURANTOIN <=16 SENSITIVE Sensitive     TRIMETH/SULFA <=20 SENSITIVE Sensitive     AMPICILLIN/SULBACTAM <=2 SENSITIVE Sensitive     PIP/TAZO <=4 SENSITIVE Sensitive     Extended ESBL NEGATIVE Sensitive     * >=100,000 COLONIES/mL ESCHERICHIA COLI  MRSA PCR Screening     Status: None   Collection Time: 11/12/18  8:00 PM  Result Value Ref Range Status   MRSA by PCR NEGATIVE NEGATIVE Final    Comment:        The GeneXpert MRSA Assay (FDA approved for NASAL specimens only), is one component of a comprehensive MRSA colonization surveillance program. It is not intended to diagnose MRSA infection nor to guide or monitor treatment for MRSA infections. Performed at [facility], [address], [place], [place] [address]   Culture, blood (routine x 2)     Status: None (Preliminary result)   Collection Time: 11/12/18  9:25 PM  Result Value Ref Range Status   Specimen Description A-LINE DRAWN BY RN  Final   Special Requests   Final    BOTTLES DRAWN AEROBIC AND ANAEROBIC Blood Culture adequate volume   Culture   Final    NO GROWTH 3 DAYS Performed at [facility], [name] [place], [place] [address]    Report Status PENDING  Incomplete  Culture, Urine     Status: Abnormal   Collection Time: 11/12/18  9:30 PM  Result Value Ref Range Status   Specimen Description URINE, CATHETERIZED  Final   Special Requests    Final    NONE Performed at [facility], [address], [place], [place] [address]    Culture <10,000 COLONIES/mL INSIGNIFICANT GROWTH (A)  Final   Report Status 11/14/2018 FINAL  Final  Culture, blood (routine x 2)     Status: None (Preliminary result)   Collection Time: 11/12/18  9:37 PM  Result Value Ref Range Status   Specimen Description BLOOD LEFT HAND  Final   Special Requests   Final    BOTTLES DRAWN AEROBIC AND ANAEROBIC Blood Culture adequate volume   Culture   Final    NO GROWTH 3 DAYS Performed at [facility], [address], [place], [place] [address]    Report Status PENDING  Incomplete    Radiology Studies: Dg Chest Port 1 View  Result Date: 11/15/2018 CLINICAL DATA:  Wheezing. Recent bowel resection for colonic perforation. EXAM: PORTABLE CHEST 1 VIEW COMPARISON:  11/12/2018 FINDINGS: Stable appearance gastric decompression tube and right jugular central line. Lungs show slightly lower volumes bilaterally with bibasilar atelectasis. Stable interstitial prominence which may be chronic. A component of mild interstitial edema can not be excluded. There may also be small bilateral pleural effusions. Follow-up PA and lateral chest x-ray may be helpful if there continue to be respiratory symptoms. No pneumothorax identified. The heart size is within normal limits. IMPRESSION: Lower lung volumes with bibasilar atelectasis and stable interstitial prominence. A component of mild interstitial edema and small bilateral pleural effusions cannot be excluded. Consider follow-up PA and lateral chest x-ray if there are persistent respiratory symptoms. Electronically Signed   By: Aletta Edouard M.D.   On: 11/15/2018 08:40   Scheduled Meds: . abacavir-dolutegravir-lamiVUDine  1 tablet Oral Daily  . chlorhexidine  15 mL Mouth Rinse BID  . docusate sodium  100 mg Oral BID  . fluconazole  150 mg Oral Daily  . fluticasone  2 spray Each Nare QHS  . furosemide  40 mg Intravenous Once  .  heparin  5,000 Units Subcutaneous Q8H  . ipratropium-albuterol  3 mL Nebulization TID  . mouth rinse  15 mL Mouth Rinse q12n4p  . mouth rinse  15 mL Mouth Rinse q12n4p  . pantoprazole (PROTONIX) IV  40 mg Intravenous QHS   Continuous  Infusions: . lactated ringers 100 mL/hr at 11/15/18 0440  . norepinephrine (LEVOPHED) Adult infusion Stopped (11/14/18 0859)  . piperacillin-tazobactam (ZOSYN)  IV 3.375 g (11/15/18 0510)  . sodium phosphate  Dextrose 5% IVPB       LOS: 3 days   Critical care: 32 mins  Evaleigh Mccamy, MD [facility]  If 7PM-7AM, please contact night-coverage [email or web address]  11/15/2018, 9:33 AM

## 2018-11-15 NOTE — Progress Notes (Addendum)
11/15/2018 3:57 PM  Update: pt developed Afib with RVR confirmed on EKG.  IV lopressor given, IV diltiazem given, if no improvement, will start IV cardizem infusion and will ask for cardiology service to see in AM.   11/15/2018 6:09 PM  Pt remains in Afib RVR on diltiazem infusion.  Will continue diltiazem for a bit longer, if no conversion or improvement, would start amiodarone infusion.  Avoiding digoxin due to renal insufficiency.      Murvin Natal MD

## 2018-11-15 NOTE — Progress Notes (Signed)
PT BP elevated 161/82. BP does not meet parameters for PRN BP medication. Continue to monitor.

## 2018-11-15 NOTE — Progress Notes (Signed)
[facility] Progress Note  3 Days Post-Op  Subjective: Doing fair. Had some Afib with RVR and placed on dilt gtt. NG still with output. Ostomy not making anything.   Objective: Vital signs in last 24 hours: Temp:  [97.5 F (36.4 C)-98.7 F (37.1 C)] 98.7 F (37.1 C) (01/21 1600) Pulse Rate:  [71-134] 75 (01/21 1800) Resp:  [7-31] 25 (01/21 1800) BP: (104-162)/(64-114) 106/87 (01/21 1800) SpO2:  [89 %-100 %] 94 % (01/21 1800) Weight:  [91.5 kg] 91.5 kg (01/21 0500) Last BM Date: 11/11/18  Intake/Output from previous day: 01/20 0701 - 01/21 0700 In: 2213.1 [I.V.:2088.3; IV Piggyback:124.8] Out: 2475 [Urine:1125; Emesis/NG output:1350] Intake/Output this shift: No intake/output data recorded.  General appearance: alert, cooperative and no distress Resp: normal work breathing GI: soft, appropriately tender, wound c/d/i with intact fascia, Ostomy pink and slightly edematous, no gas in bag  Lab Results:  Recent Labs    11/14/18 0441 11/15/18 0447  WBC 14.0* 10.8*  HGB 9.2* 8.7*  HCT 27.7* 26.9*  PLT 166 146*   BMET Recent Labs    11/14/18 0441 11/15/18 0447  NA 140 142  K 4.1 4.3  CL 112* 113*  CO2 21* 22  GLUCOSE 114* 97  BUN 34* 29*  CREATININE 1.83* 1.44*  CALCIUM 8.3* 8.8*    Studies/Results: Dg Chest Port 1 View  Result Date: 11/15/2018 CLINICAL DATA:  Wheezing. Recent bowel resection for colonic perforation. EXAM: PORTABLE CHEST 1 VIEW COMPARISON:  11/12/2018 FINDINGS: Stable appearance gastric decompression tube and right jugular central line. Lungs show slightly lower volumes bilaterally with bibasilar atelectasis. Stable interstitial prominence which may be chronic. A component of mild interstitial edema can not be excluded. There may also be small bilateral pleural effusions. Follow-up PA and lateral chest x-ray may be helpful if there continue to be respiratory symptoms. No pneumothorax identified. The heart size is within normal  limits. IMPRESSION: Lower lung volumes with bibasilar atelectasis and stable interstitial prominence. A component of mild interstitial edema and small bilateral pleural effusions cannot be excluded. Consider follow-up PA and lateral chest x-ray if there are persistent respiratory symptoms. Electronically Signed   By: Aletta Edouard M.D.   On: 11/15/2018 08:40    Anti-infectives: Anti-infectives (From admission, onward)   Start     Dose/Rate Route Frequency Ordered Stop   11/13/18 1000  abacavir-dolutegravir-lamiVUDine (TRIUMEQ) 600-50-300 MG per tablet 1 tablet     1 tablet Oral Daily 11/12/18 1936     11/13/18 1000  fluconazole (DIFLUCAN) tablet 150 mg     150 mg Oral Daily 11/12/18 1936     11/12/18 2200  piperacillin-tazobactam (ZOSYN) IVPB 3.375 g     3.375 g 12.5 mL/hr over 240 Minutes Intravenous Every 8 hours 11/12/18 1941 11/16/18 2159   11/12/18 1430  ceFEPIme (MAXIPIME) 2 g in sodium chloride 0.9 % 100 mL IVPB     2 g 200 mL/hr over 30 Minutes Intravenous  Once 11/12/18 1428 11/12/18 1516   11/12/18 1430  metroNIDAZOLE (FLAGYL) IVPB 500 mg     500 mg 100 mL/hr over 60 Minutes Intravenous  Once 11/12/18 1428 11/12/18 1629      Assessment/Plan: Ms. Riester is a 78 yo s/p Ex lap, partial colectomy with end colostomy for stercoral ulcer. Doing fair but had some A fib. Likely from fluids/ versus CVL. PRN For pain Dilt gtt in place now, appreciate Dr. Durenda Age help; off pressors  NPO, NG until ostomy output Ostomy care Lytes looking good H&H drifting  still, monitor WBC trending down, zosyn until POD 4 SCDs, heparin sq Leave foley tonight and will remove tomorrow given that it is so late    LOS: 3 days    Virl Cagey 11/15/2018

## 2018-11-15 NOTE — Progress Notes (Signed)
[facility] for electrolyte replacement Indication: abnormal electrolytes   Allergies  Allergen Reactions  . Bee Venom Anaphylaxis  . Promethazine Swelling  . Tenofovir Disoproxil Fumarate [Tenofovir Disoproxil Fumarate] Other (See Comments)    Renal failure, (08/27/2006)  . Compazine  [Prochlorperazine Edisylate] Nausea And Vomiting  . Haloperidol Lactate Other (See Comments)    Reaction is muscle tension, causes severe spasms in face and neck. Forces eyes to roll in the back of the head  . Brompheniramine-Acetaminophen   . Chlorcyclizine   . Chlorpromazine   . Codeine Itching and Nausea And Vomiting  . Navane [Thiothixene] Other (See Comments) and Nausea And Vomiting    Same muscle spasm reaction as haldol  . Other     NOVACAINE  . Prednisone     Brief mild psychosis and agitation  . Prochlorperazine Nausea And Vomiting  . Promethazine Hcl Other (See Comments)    Same as haldol   . Propoxyphene N-Acetaminophen Itching and Nausea And Vomiting  . Morphine Itching and Swelling    Patient Measurements: Height: 5\' 4"  (162.6 cm) Weight: 201 lb 11.5 oz (91.5 kg) IBW/kg (Calculated) : 54.7   Vital Signs: Temp: 97.8 F (36.6 C) (01/21 0804) Temp Source: Axillary (01/21 0804) BP: 153/77 (01/21 0700) Pulse Rate: 73 (01/21 0700) Intake/Output from previous day: 01/20 0701 - 01/21 0700 In: 2213.1 [I.V.:2088.3; IV Piggyback:124.8] Out: 2475 [Urine:1125; Emesis/NG output:1350] Intake/Output from this shift: No intake/output data recorded.  Labs: Recent Labs    11/12/18 1240 11/13/18 0240 11/14/18 0441 11/15/18 0447  WBC 8.7 15.5* 14.0* 10.8*  HGB 14.1 12.2 9.2* 8.7*  HCT 42.2 36.9 27.7* 26.9*  PLT 202 238 166 146*  CREATININE 1.78* 2.09* 1.83* 1.44*  MG  --  1.3* 2.0 2.0  PHOS  --  0.7* 3.0 2.5  ALBUMIN 4.1  --   --   --   PROT 6.4*  --   --   --   AST 24  --   --   --   ALT 20  --   --   --   ALKPHOS 64  --   --   --    BILITOT 1.3*  --   --   --    Estimated Creatinine Clearance: 35.8 mL/min (A) (by C-G formula based on SCr of 1.44 mg/dL (H)).   Microbiology: Recent Results (from the past 720 hour(s))  Urine Culture     Status: Abnormal (Preliminary result)   Collection Time: 11/12/18 12:23 PM  Result Value Ref Range Status   Specimen Description   Final    URINE, CATHETERIZED Performed at [facility], [address], [place], [place] [address]    Special Requests   Final    NONE Performed at [facility], [address], [place], [place] [address]    Culture >=100,000 COLONIES/mL ESCHERICHIA COLI (A)  Final   Report Status PENDING  Incomplete  MRSA PCR Screening     Status: None   Collection Time: 11/12/18  8:00 PM  Result Value Ref Range Status   MRSA by PCR NEGATIVE NEGATIVE Final    Comment:        The GeneXpert MRSA Assay (FDA approved for NASAL specimens only), is one component of a comprehensive MRSA colonization surveillance program. It is not intended to diagnose MRSA infection nor to guide or monitor treatment for MRSA infections. Performed at [facility], [address] [place], [place] [address]   Culture, blood (routine x  2)     Status: None (Preliminary result)   Collection Time: 11/12/18  9:25 PM  Result Value Ref Range Status   Specimen Description A-LINE DRAWN BY RN  Final   Special Requests   Final    BOTTLES DRAWN AEROBIC AND ANAEROBIC Blood Culture adequate volume   Culture   Final    NO GROWTH 3 DAYS Performed at [facility], [address] [place], [place] [address]    Report Status PENDING  Incomplete  Culture, Urine     Status: Abnormal   Collection Time: 11/12/18  9:30 PM  Result Value Ref Range Status   Specimen Description URINE, CATHETERIZED  Final   Special Requests   Final    NONE Performed at [facility], [address], [place], [place] [address]    Culture <10,000 COLONIES/mL INSIGNIFICANT GROWTH (A)  Final   Report Status 11/14/2018 FINAL   Final  Culture, blood (routine x 2)     Status: None (Preliminary result)   Collection Time: 11/12/18  9:37 PM  Result Value Ref Range Status   Specimen Description BLOOD LEFT HAND  Final   Special Requests   Final    BOTTLES DRAWN AEROBIC AND ANAEROBIC Blood Culture adequate volume   Culture   Final    NO GROWTH 3 DAYS Performed at [facility], [address], [place], [place] [address]    Report Status PENDING  Incomplete    Medical History: Past Medical History:  Diagnosis Date  . Anxiety   . Arthritis   . Bronchitis   . CKD (chronic kidney disease) stage 3, GFR 30-59 ml/min (HCC) 05/04/2016  . COPD (chronic obstructive pulmonary disease) ([place])   . Depression   . Elevated LFTs 05/04/2016  . History of TIAs 1981   left side weakness  . HIV (human immunodeficiency virus infection) ([place])   . Hypertension   . Kidney disease related to HIV infection [facility])    pt states she has to have her creatinine level checked often   . Nausea 03/03/2017  . Peripheral neuropathy    bilateral feet  . Sinusitis 05/06/2018  . UTI (urinary tract infection) 03/03/2017  . Weight loss 03/03/2017    Medications:  Medications Prior to Admission  Medication Sig Dispense Refill Last Dose  . albuterol (PROVENTIL HFA;VENTOLIN HFA) 108 (90 Base) MCG/ACT inhaler Inhale 1-2 puffs into the lungs every 6 (six) hours as needed for wheezing or shortness of breath. 1 Inhaler 0 11/12/2018 at 0830  . amitriptyline (ELAVIL) 50 MG tablet Take 50 mg by mouth at bedtime.    11/11/2018 at Unknown time  . cephALEXin (KEFLEX) 500 MG capsule Take 1 capsule (500 mg total) by mouth 3 (three) times daily. 21 capsule 0 11/12/2018 at 0830  . Cholecalciferol (VITAMIN D3) 10000 units TABS Take 1 tablet by mouth daily.   11/11/2018 at Unknown time  . diphenhydrAMINE (BENADRYL) 25 mg capsule Take 25 mg by mouth daily as needed for itching (itching from hydrocodone).   Past Month at Unknown time  . fluconazole (DIFLUCAN) 200 MG tablet Take  1 tablet (200 mg total) by mouth daily. 14 tablet 0 11/10/2018 at Unknown time  . fluticasone (FLONASE) 50 MCG/ACT nasal spray Place 2 sprays into both nostrils at bedtime. 16 g 8 11/12/2018 at 0830  . furosemide (LASIX) 40 MG tablet Take 40 mg by mouth daily as needed for fluid.    Past Week at 0830  . guaiFENesin (MUCINEX) 600 MG 12 hr tablet Take  1 tablet (600 mg total) by mouth 2 (two) times daily. 30 tablet 0 Past Week at Unknown time  . ipratropium-albuterol (DUONEB) 0.5-2.5 (3) MG/3ML SOLN Inhale 3 mLs into the lungs every 6 (six) hours as needed (shortness of breath).    Past Week at Unknown time  . Multiple Vitamin (ONE-A-DAY 55 PLUS PO) Take 1 tablet by mouth daily.   Past Month at Unknown time  . nebivolol (BYSTOLIC) 10 MG tablet Take 10 mg by mouth daily.    11/12/2018 at 0830  . ondansetron (ZOFRAN) 8 MG tablet Take 8 mg by mouth daily as needed for nausea or vomiting.    11/11/2018 at Unknown time  . Oxycodone HCl 10 MG TABS Take 10 mg by mouth 4 (four) times daily as needed. For pain   11/11/2018 at 0830  . phenazopyridine (PYRIDIUM) 200 MG tablet Take 1 tablet (200 mg total) by mouth 3 (three) times daily as needed for pain. 9 tablet 0 11/12/2018 at 0700  . polyvinyl alcohol (LIQUIFILM TEARS) 1.4 % ophthalmic solution Place 1 drop into both eyes as needed for dry eyes.   11/11/2018 at Unknown time  . PREMARIN vaginal cream Place 1 Applicatorful vaginally at bedtime.    Past Week at Unknown time  . thiamine (VITAMIN B-1) 100 MG tablet Take 100 mg by mouth daily.   11/12/2018 at 0830  . TRIUMEQ 600-50-300 MG tablet TAKE 1 TABLET ONCE DAILY. 30 tablet 1 11/12/2018 at 0830  . LORazepam (ATIVAN) 0.5 MG tablet Take 0.5 mg by mouth 2 (two) times daily.    11/11/2018    Assessment: Pharmacy consulted to replace electrolytes in patient admitted for hypotension, septic shock from fecal peritonitis.  Patient is 3 days postop now.  Goal of Therapy:  Replace electrolytes   Plan:  Potassium,  magnesium at goal Replace phosphorus- sodium phosphate 10 mmol x 1 dose. Monitor electrolytes in AM  Ramond Craver 11/15/2018,8:22 AM

## 2018-11-16 ENCOUNTER — Inpatient Hospital Stay (HOSPITAL_COMMUNITY): Payer: Medicare HMO

## 2018-11-16 DIAGNOSIS — I9789 Other postprocedural complications and disorders of the circulatory system, not elsewhere classified: Secondary | ICD-10-CM

## 2018-11-16 DIAGNOSIS — I4891 Unspecified atrial fibrillation: Secondary | ICD-10-CM

## 2018-11-16 LAB — CBC WITH DIFFERENTIAL/PLATELET
Abs Immature Granulocytes: 0.1 10*3/uL — ABNORMAL HIGH (ref 0.00–0.07)
Basophils Absolute: 0 10*3/uL (ref 0.0–0.1)
Basophils Relative: 0 %
EOS ABS: 0.2 10*3/uL (ref 0.0–0.5)
Eosinophils Relative: 3 %
HCT: 27.5 % — ABNORMAL LOW (ref 36.0–46.0)
Hemoglobin: 8.6 g/dL — ABNORMAL LOW (ref 12.0–15.0)
IMMATURE GRANULOCYTES: 1 %
LYMPHS ABS: 1.8 10*3/uL (ref 0.7–4.0)
Lymphocytes Relative: 21 %
MCH: 33.7 pg (ref 26.0–34.0)
MCHC: 31.3 g/dL (ref 30.0–36.0)
MCV: 107.8 fL — AB (ref 80.0–100.0)
MONOS PCT: 8 %
Monocytes Absolute: 0.7 10*3/uL (ref 0.1–1.0)
Neutro Abs: 5.7 10*3/uL (ref 1.7–7.7)
Neutrophils Relative %: 67 %
Platelets: 174 10*3/uL (ref 150–400)
RBC: 2.55 MIL/uL — ABNORMAL LOW (ref 3.87–5.11)
RDW: 12.9 % (ref 11.5–15.5)
WBC: 8.6 10*3/uL (ref 4.0–10.5)
nRBC: 0 % (ref 0.0–0.2)

## 2018-11-16 LAB — BASIC METABOLIC PANEL
Anion gap: 7 (ref 5–15)
BUN: 26 mg/dL — ABNORMAL HIGH (ref 8–23)
CO2: 25 mmol/L (ref 22–32)
Calcium: 8.9 mg/dL (ref 8.9–10.3)
Chloride: 110 mmol/L (ref 98–111)
Creatinine, Ser: 1.46 mg/dL — ABNORMAL HIGH (ref 0.44–1.00)
GFR calc Af Amer: 40 mL/min — ABNORMAL LOW (ref >60)
GFR calc non Af Amer: 34 mL/min — ABNORMAL LOW (ref >60)
Glucose, Bld: 89 mg/dL (ref 70–99)
Potassium: 3.9 mmol/L (ref 3.5–5.1)
Sodium: 142 mmol/L (ref 135–145)

## 2018-11-16 LAB — TSH: TSH: 4.603 u[IU]/mL — AB (ref 0.350–4.500)

## 2018-11-16 LAB — MAGNESIUM: Magnesium: 2.1 mg/dL (ref 1.7–2.4)

## 2018-11-16 LAB — PHOSPHORUS: Phosphorus: 2.8 mg/dL (ref 2.5–4.6)

## 2018-11-16 LAB — T4, FREE: Free T4: 0.91 ng/dL (ref 0.82–1.77)

## 2018-11-16 MED ORDER — FUROSEMIDE 10 MG/ML IJ SOLN
40.0000 mg | Freq: Once | INTRAMUSCULAR | Status: AC
  Administered 2018-11-16: 40 mg via INTRAVENOUS
  Filled 2018-11-16: qty 4

## 2018-11-16 MED ORDER — DILTIAZEM HCL 30 MG PO TABS
30.0000 mg | ORAL_TABLET | Freq: Three times a day (TID) | ORAL | Status: DC
  Administered 2018-11-16 – 2018-11-17 (×4): 30 mg via ORAL
  Filled 2018-11-16 (×4): qty 1

## 2018-11-16 NOTE — Consult Note (Signed)
[facility] Nurse ostomy follow up Stoma type/location: LLQ Colostomy Stomal assessment/size: 1 1/2" pale pink and moist  NO output in pouch.  NG tube in place and PO ice chips only.  Peristomal assessment: intact Treatment options for stomal/peristomal skin: barrier ring and 1 piece flat pouch Output blood tinged liquid only Ostomy pouching: 1pc.flat Education provided: Pouch change with daughter at bedside. Home with [facility] for ongoing teaching Enrolled patient in [place] Start Discharge program: Yes will today.  [place] team will follow.  Domenic Moras MSN, RN, FNP-BC CWON Wound, Ostomy, Continence Nurse Pager [phone]

## 2018-11-16 NOTE — Progress Notes (Signed)
Pt refused nebulizer treatment after the medication was opened and scannned

## 2018-11-16 NOTE — Consult Note (Addendum)
Cardiology Consult    [name] ID: [name]; [id number]; 05-Sep-1941   Admit date: 11/12/2018 Date of Consult: 11/16/2018  Primary Care Provider: Octavio Graves, DO Primary Cardiologist: New to [facility] - Dr. Harl Bowie  [name] Profile    [name] is a 78 y.o. female with past medical history of HTN, COPD, Stage 3 CKD, and HIV who is being seen today for the evaluation of atrial fibrillation with RVR at the request of Dr. Wynetta Emery.   History of Present Illness    [name] presented to [facility] ED on 11/12/2018 for evaluation of lower abdominal pain with associated nausea and vomiting.  Initial labs showed WBC 8.7, Hgb 14.1, platelets 202, Na+ 139, K+ 4.0, and creatinine 1.78 (close to baseline).  LFTs within normal limits.  Lipase 24.  Lactic acid elevated to 3.7.  CT abdomen showed free air throughout the abdomen consistent with bowel perforation and thick-walled bladder and adjacent fat stranding suggesting cystitis.  She was started on cefepime and Flagyl at the time of admission. General Surgery evaluated the [name] and she was taken to the OR by Dr. Constance Haw on the day of admission and found to have a stercoral ulcer with perforation and fecal peritonitis. Underwent partial colectomy and placement of a colostomy. She remained hypotensive following surgery and required norepinephrine in the setting of septic shock. Lactic Acid peaked at 5.8 and was trending down to 4.0 by most recent check.   Norepinephrine was discontinued on 11/14/2018 as BP was remaining stable. Antibiotic therapy has been transitioned to Zosyn. On 11/15/2018 at approximately 1500, she was noted to be in atrial fibrillation with RVR, HR in the 130's to 140's. This was confirmed by EKG which showed atrial fibrillation with RVR, HR 129. She was started on IV Cardizem and converted to NSR around 2000. Has maintained NSR overnight and this morning with HR in the 60's to 70's. Still on IV Cardizem at a rate of 2.5  mg/hr.  Most recent labs this AM show WBC 8.6, Hgb 8.6, platelets 174, Na+ 142, K+ 3.9, and creatinine 1.46. Mg 2.1.  In talking with the [name] today, she reports associated palpitations with her episode of atrial fibrillation with RVR yesterday. No recurrent palpitations overnight or this AM. Still having discomfort along her abdominal incisions. No recent orthopnea, PND, or lower extremity edema. Says her breathing has improved as well.    Past Medical History:  Diagnosis Date  . Anxiety   . Arthritis   . Bronchitis   . CKD (chronic kidney disease) stage 3, GFR 30-59 ml/min (HCC) 05/04/2016  . COPD (chronic obstructive pulmonary disease) ([place])   . Depression   . Elevated LFTs 05/04/2016  . History of TIAs 1981   left side weakness  . HIV (human immunodeficiency virus infection) ([place])   . Hypertension   . Kidney disease related to HIV infection [facility])    pt states she has to have her creatinine level checked often   . Nausea 03/03/2017  . Peripheral neuropathy    bilateral feet  . Sinusitis 05/06/2018  . UTI (urinary tract infection) 03/03/2017  . Weight loss 03/03/2017    Past Surgical History:  Procedure Laterality Date  . ABDOMINAL HYSTERECTOMY  1985  . BACK SURGERY    . CATARACT EXTRACTION, BILATERAL    . CHOLECYSTECTOMY    . COLONOSCOPY  08/10/2002   NUR: Normal colonoscopy except for some hemorrhoids and some erythema at the dentate line  . COLONOSCOPY  12/10/2006  Edwards:Diffuse colitis in the transverse and descending colon/This is not entirely typical of ischemic colitis or her HIV positive/ disease.  We need to be concerned about other causes  . COLONOSCOPY  01/19/2011   RMR: Internal and external hemorrhoids, likely source of hematochezia anal papilla, otherwise normal rectal mucosa/ Left-sided diverticula.  Cecal polyp with status post cold snare polypectomy.  Remainder of colonic mucosa appeared normal  . COLOSTOMY N/A 11/12/2018   Procedure: COLOSTOMY;  Surgeon:  Virl Cagey, MD;  Location: [facility] ORS;  Service: General;  Laterality: N/A;  . ESOPHAGOGASTRODUODENOSCOPY  08/10/2002     NUR: Mild changes of reflux esophagitis limited to gastroesophageal junction  No evidence of ring, stricture, or esophageal candidiasis dysphagia/ Gastritis, possibly H. pylori induced  . ESOPHAGOGASTRODUODENOSCOPY  12/25/2002   NUR: Erosive antral gastritis.  The degree of gastritis is more significant than her last exam/ Normal examination of the esophagus/ Esophageal dilatation performed by passing 38 and [address] [place]  . ESOPHAGOGASTRODUODENOSCOPY  11/13/2010   RMR: Circumferential distal esophageal erosions with soft peptic stricture, consistent with erosive reflux esophagitis or stricture  formation, status post dilation as described above/  Small hiatal hernia/ Tiny antral erosions, otherwise normal stomach D1 and D2  . HEMORRHOID SURGERY  09/02/2012   Procedure: HEMORRHOIDECTOMY;  Surgeon: Jamesetta So, MD;  Location: [facility] ORS;  Service: General;  Laterality: N/A;  . LAPAROTOMY N/A 11/12/2018   Procedure: EXPLORATORY LAPAROTOMY;  Surgeon: Virl Cagey, MD;  Location: [facility] ORS;  Service: General;  Laterality: N/A;  . PARTIAL COLECTOMY N/A 11/12/2018   Procedure: PARTIAL COLECTOMY;  Surgeon: Virl Cagey, MD;  Location: [facility] ORS;  Service: General;  Laterality: N/A;  . TUBAL LIGATION       Home Medications:  Prior to Admission medications   Medication Sig Start Date End Date Taking? Authorizing Provider  albuterol (PROVENTIL HFA;VENTOLIN HFA) 108 (90 Base) MCG/ACT inhaler Inhale 1-2 puffs into the lungs every 6 (six) hours as needed for wheezing or shortness of breath. 04/02/17  Yes Nat Christen, MD  amitriptyline (ELAVIL) 50 MG tablet Take 50 mg by mouth at bedtime.    Yes [provider]  cephALEXin (KEFLEX) 500 MG capsule Take 1 capsule (500 mg total) by mouth 3 (three) times daily. 08/29/18  Yes Julianne Rice, MD  Cholecalciferol (VITAMIN D3)  10000 units TABS Take 1 tablet by mouth daily.   Yes [provider]  diphenhydrAMINE (BENADRYL) 25 mg capsule Take 25 mg by mouth daily as needed for itching (itching from hydrocodone).   Yes [provider]  fluconazole (DIFLUCAN) 200 MG tablet Take 1 tablet (200 mg total) by mouth daily. 08/29/18  Yes Julianne Rice, MD  fluticasone [facility]) 50 MCG/ACT nasal spray Place 2 sprays into both nostrils at bedtime. 05/06/18  Yes [name], Lavell Islam, MD  furosemide (LASIX) 40 MG tablet Take 40 mg by mouth daily as needed for fluid.    Yes [provider]  guaiFENesin (MUCINEX) 600 MG 12 hr tablet Take 1 tablet (600 mg total) by mouth 2 (two) times daily. 03/30/17  Yes Kathie Dike, MD  ipratropium-albuterol (DUONEB) 0.5-2.5 (3) MG/3ML SOLN Inhale 3 mLs into the lungs every 6 (six) hours as needed (shortness of breath).  04/02/17  Yes [provider]  Multiple Vitamin (ONE-A-DAY 55 PLUS PO) Take 1 tablet by mouth daily.   Yes [provider]  nebivolol (BYSTOLIC) 10 MG tablet Take 10 mg by mouth daily.    Yes [provider]  ondansetron (ZOFRAN) 8 MG tablet Take 8 mg by mouth daily as needed for nausea or vomiting.    Yes [provider]  Oxycodone HCl 10 MG TABS Take 10 mg by mouth 4 (four) times daily as needed. For pain 03/25/17  Yes [provider]  phenazopyridine (PYRIDIUM) 200 MG tablet Take 1 tablet (200 mg total) by mouth 3 (three) times daily as needed for pain. 08/28/18  Yes Julianne Rice, MD  polyvinyl alcohol (LIQUIFILM TEARS) 1.4 % ophthalmic solution Place 1 drop into both eyes as needed for dry eyes.   Yes [provider]  PREMARIN vaginal cream Place 1 Applicatorful vaginally at bedtime.  09/19/18  Yes [provider]  thiamine (VITAMIN B-1) 100 MG tablet Take 100 mg by mouth daily.   Yes [provider]  TRIUMEQ 600-50-300 MG tablet TAKE 1 TABLET ONCE DAILY. 09/30/18  Yes [name],  Lavell Islam, MD  LORazepam (ATIVAN) 0.5 MG tablet Take 0.5 mg by mouth 2 (two) times daily.  11/01/18   [provider]    Inpatient Medications: Scheduled Meds: . abacavir-dolutegravir-lamiVUDine  1 tablet Oral Daily  . chlorhexidine  15 mL Mouth Rinse BID  . Chlorhexidine Gluconate Cloth  6 each Topical Daily  . docusate sodium  100 mg Oral BID  . fluconazole  150 mg Oral Daily  . fluticasone  2 spray Each Nare QHS  . heparin  5,000 Units Subcutaneous Q8H  . ipratropium-albuterol  3 mL Nebulization TID  . mouth rinse  15 mL Mouth Rinse q12n4p  . mouth rinse  15 mL Mouth Rinse q12n4p  . nebivolol  10 mg Oral Daily  . pantoprazole (PROTONIX) IV  40 mg Intravenous QHS  . sodium chloride flush  10-40 mL Intracatheter Q12H   Continuous Infusions: . diltiazem (CARDIZEM) infusion 2.5 mg/hr (11/16/18 0624)  . lactated ringers Stopped (11/16/18 0536)  . piperacillin-tazobactam (ZOSYN)  IV 12.5 mL/hr at 11/16/18 0624   PRN Meds: albuterol, alum & mag hydroxide-simeth, diphenhydrAMINE **OR** diphenhydrAMINE, hydrALAZINE, HYDROmorphone (DILAUDID) injection, ipratropium-albuterol, LORazepam, ondansetron **OR** ondansetron (ZOFRAN) IV, polyvinyl alcohol, sodium chloride flush  Allergies:    Allergies  Allergen Reactions  . Bee Venom Anaphylaxis  . Promethazine Swelling  . Tenofovir Disoproxil Fumarate [Tenofovir Disoproxil Fumarate] Other (See Comments)    Renal failure, (08/27/2006)  . Compazine  [Prochlorperazine Edisylate] Nausea And Vomiting  . Haloperidol Lactate Other (See Comments)    Reaction is muscle tension, causes severe spasms in face and neck. Forces eyes to roll in the back of the head  . Brompheniramine-Acetaminophen   . Chlorcyclizine   . Chlorpromazine   . Codeine Itching and Nausea And Vomiting  . Navane [Thiothixene] Other (See Comments) and Nausea And Vomiting    Same muscle spasm reaction as haldol  . Other     NOVACAINE  . Prednisone     Brief mild  psychosis and agitation  . Prochlorperazine Nausea And Vomiting  . Promethazine Hcl Other (See Comments)    Same as haldol   . Propoxyphene N-Acetaminophen Itching and Nausea And Vomiting  . Morphine Itching and Swelling    Social History:   Social History   Socioeconomic History  . Marital status: Widowed    Spouse name: Not on file  . Number of children: Not on file  . Years of education: Not on file  . Highest education level: Not on file  Occupational History  . Occupation: retired    Fish farm manager: RETIRED  Social Needs  .  Financial resource strain: Not on file  . Food insecurity:    Worry: Not on file    Inability: Not on file  . Transportation needs:    Medical: Not on file    Non-medical: Not on file  Tobacco Use  . Smoking status: Never Smoker  . Smokeless tobacco: Never Used  Substance and Sexual Activity  . Alcohol use: No  . Drug use: No  . Sexual activity: Not Currently    Comment: declined condoms  Lifestyle  . Physical activity:    Days per week: Not on file    Minutes per session: Not on file  . Stress: Not on file  Relationships  . Social connections:    Talks on phone: Not on file    Gets together: Not on file    Attends religious service: Not on file    Active member of club or organization: Not on file    Attends meetings of clubs or organizations: Not on file    Relationship status: Not on file  . Intimate partner violence:    Fear of current or ex partner: Not on file    Emotionally abused: Not on file    Physically abused: Not on file    Forced sexual activity: Not on file  Other Topics Concern  . Not on file  Social History Narrative  . Not on file     Family History:    Family History  Problem Relation Age of Onset  . Diabetes Mother   . Diabetes Brother   . Colon cancer Brother       Review of Systems    General:  No chills, fever, night sweats or weight changes.  Cardiovascular:  No chest pain, dyspnea on exertion, edema,  orthopnea, paroxysmal nocturnal dyspnea. Positive for palpitations.  Dermatological: No rash, lesions/masses Respiratory: No cough, dyspnea Urologic: No hematuria, dysuria Abdominal:   No diarrhea, bright red blood per rectum, melena, or hematemesis. Positive for nausea and abdominal pain (improving).  Neurologic:  No visual changes, wkns, changes in mental status. All other systems reviewed and are otherwise negative except as noted above.  Physical Exam/Data    Vitals:   11/16/18 0545 11/16/18 0600 11/16/18 0615 11/16/18 0745  BP: (!) 143/68 (!) 157/74 (!) 150/73   Pulse: 64 66 64 65  Resp: (!) 26 (!) 27 (!) 23 (!) 23  Temp:    99.7 F (37.6 C)  TempSrc:    Oral  SpO2: 99% 97% 99% 99%  Weight:      Height:        Intake/Output Summary (Last 24 hours) at 11/16/2018 0830 Last data filed at 11/16/2018 8841 Gross per 24 hour  Intake 1530.99 ml  Output 5100 ml  Net -3569.01 ml   Filed Weights   11/13/18 0615 11/15/18 0500 11/16/18 0500  Weight: 98.1 kg 91.5 kg 91.5 kg   Body mass index is 34.63 kg/m.   General: Pleasant, Caucasian female appearing in NAD Psych: Normal affect. Neuro: Alert and oriented X 3. Moves all extremities spontaneously. HEENT: NG tube in place.   Neck: Supple without bruits or JVD. Lungs:  Resp regular and unlabored, CTA without wheezing or rales. Heart: RRR no s3, s4, or murmurs. Abdomen: Soft, non-tender, dressings in place and ostomy noted.  Extremities: No clubbing, cyanosis or lower extremity edema. DP/PT/Radials 2+ and equal bilaterally.   Labs/Studies     Relevant CV Studies:  Echocardiogram: 05/2014 Study Conclusions  - Left ventricle: The cavity size  was normal. Wall thickness was increased in a pattern of mild LVH. Systolic function was vigorous. The estimated ejection fraction was in the range of 65% to 70%.  Laboratory Data:  Chemistry Recent Labs  Lab 11/14/18 0441 11/15/18 0447 11/16/18 0415  NA 140 142 142  K  4.1 4.3 3.9  CL 112* 113* 110  CO2 21* 22 25  GLUCOSE 114* 97 89  BUN 34* 29* 26*  CREATININE 1.83* 1.44* 1.46*  CALCIUM 8.3* 8.8* 8.9  GFRNONAA 26* 35* 34*  GFRAA 30* 40* 40*  ANIONGAP 7 7 7     Recent Labs  Lab 11/12/18 1240  PROT 6.4*  ALBUMIN 4.1  AST 24  ALT 20  ALKPHOS 64  BILITOT 1.3*   Hematology Recent Labs  Lab 11/14/18 0441 11/15/18 0447 11/16/18 0415  WBC 14.0* 10.8* 8.6  RBC 2.63* 2.54* 2.55*  HGB 9.2* 8.7* 8.6*  HCT 27.7* 26.9* 27.5*  MCV 105.3* 105.9* 107.8*  MCH 35.0* 34.3* 33.7  MCHC 33.2 32.3 31.3  RDW 13.1 13.2 12.9  PLT 166 146* 174   Cardiac EnzymesNo results for input(s): TROPONINI in the last 168 hours. No results for input(s): TROPIPOC in the last 168 hours.  BNPNo results for input(s): BNP, PROBNP in the last 168 hours.  DDimer No results for input(s): DDIMER in the last 168 hours.  Radiology/Studies:  Ct Abdomen Pelvis Wo Contrast  Result Date: 11/12/2018 CLINICAL DATA:  Abdominal pain with nausea and vomiting. EXAM: CT ABDOMEN AND PELVIS WITHOUT CONTRAST TECHNIQUE: Multidetector CT imaging of the abdomen and pelvis was performed following the standard protocol without IV contrast. COMPARISON:  May 31, 2014 FINDINGS: Lower chest: Coronary artery calcifications. Small hiatal hernia. Lower chest otherwise unremarkable. Hepatobiliary: Cholecystectomy. Evaluation of the liver is limited without contrast but no obvious liver masses are noted. Pancreas: Unremarkable. No pancreatic ductal dilatation or surrounding inflammatory changes. Spleen: Normal in size without focal abnormality. Adrenals/Urinary Tract: There is a fat containing nodule in the left adrenal gland consistent with a myelolipoma, unchanged since August 2015 measuring 2 cm. Right adrenal gland is normal. Kidneys and ureters are normal. The bladder is thick walled with fat stranding adjacent to the bladder. Stomach/Bowel: There is a small hiatal hernia. The stomach is otherwise normal.  The small bowel is unremarkable. The colon is markedly abnormal. There is a stool ball in the distal transverse colon with adjacent colonic wall thickening. There is suggested pneumatosis in the wall of the colon in this location. Wall thickening appears to extend into the more proximal distal half of the transverse colon. There is free air and fat stranding surrounding the colon containing the stool ball. The proximal descending colon appears inflamed which may be secondary to the overall process. Colonic diverticuli are seen throughout the descending and sigmoid colon. The ascending and proximal transverse colon are normal. The appendix is not visualized but there is no secondary evidence of appendicitis. Vascular/Lymphatic: Minimal atherosclerotic changes in the abdominal aorta. No adenopathy. Reproductive: Status post hysterectomy. No adnexal masses. Other: There is some free fluid in the pelvis, likely reactive to either the bladder or colonic pathology. Free air in the abdomen is identified, relatively diffuse in distribution. However, the largest region of free air is adjacent to the abnormal distal transverse colon suggesting this is the location of perforation. No obvious portal venous gas. Musculoskeletal: No acute or significant osseous findings. IMPRESSION: 1. Free air throughout the abdomen consistent with bowel perforation. The largest region of free air is adjacent to  the abnormal transverse colon which contains wall thickening and a stool ball. The wall thickening extends over several cm into the distal half of the transverse colon. The proximal descending colon appears inflamed as well which may be secondary to the transverse colon abnormality. I suspect the distal transverse colon in the region of the stool ball is the location of perforation and there is suggested pneumatosis in the wall of the colon in this location. The findings could be due to an infectious, inflammatory, or vascular  colitis/process. Neoplasm/malignancy is not excluded. 2. Thick-walled bladder with adjacent fat stranding suggesting cystitis. Recommend correlation with urinalysis. 3. Left adrenal myelolipoma. 4. Colonic diverticulosis. Findings called to Dr. Thurnell Garbe. Electronically Signed   By: Dorise Bullion III M.D   On: 11/12/2018 14:47   Dg Chest Port 1 View  Result Date: 11/16/2018 CLINICAL DATA:  Pulmonary edema EXAM: PORTABLE CHEST 1 VIEW COMPARISON:  11/15/2018 FINDINGS: Cardiac shadow remains enlarged. Right jugular central line and nasogastric catheter are again seen and stable. Stable interstitial changes are noted similar to that seen on the prior exam. Mild right basilar atelectasis is noted. No bony abnormality is seen. IMPRESSION: No change from the previous exam. Electronically Signed   By: Inez Catalina M.D.   On: 11/16/2018 07:15   Dg Chest Port 1 View  Result Date: 11/15/2018 CLINICAL DATA:  Wheezing. Recent bowel resection for colonic perforation. EXAM: PORTABLE CHEST 1 VIEW COMPARISON:  11/12/2018 FINDINGS: Stable appearance gastric decompression tube and right jugular central line. Lungs show slightly lower volumes bilaterally with bibasilar atelectasis. Stable interstitial prominence which may be chronic. A component of mild interstitial edema can not be excluded. There may also be small bilateral pleural effusions. Follow-up PA and lateral chest x-ray may be helpful if there continue to be respiratory symptoms. No pneumothorax identified. The heart size is within normal limits. IMPRESSION: Lower lung volumes with bibasilar atelectasis and stable interstitial prominence. A component of mild interstitial edema and small bilateral pleural effusions cannot be excluded. Consider follow-up PA and lateral chest x-ray if there are persistent respiratory symptoms. Electronically Signed   By: Aletta Edouard M.D.   On: 11/15/2018 08:40   Dg Chest Port 1 View  Result Date: 11/12/2018 CLINICAL DATA:  Renal  insufficiency.  Central line placement. EXAM: PORTABLE CHEST 1 VIEW COMPARISON:  Radiograph 04/02/2017 FINDINGS: RIGHT central venous line tip in distal SVC. No pneumothorax. NG tube extends into the stomach. Low lung volumes. Central venous congestion. No pulmonary edema. Lung bases poorly evaluated. IMPRESSION: 1. Central line placement without complication. 2. Low lung volumes. 3. Central venous congestion. Electronically Signed   By: Suzy Bouchard M.D.   On: 11/12/2018 21:55     Assessment & Plan    1. New-Onset Atrial Fibrillation with RVR - admitted with septic shock and found to have a stercoral ulcer with perforation and fecal peritonitis. Noted to have gone into atrial fibrillation with RVR on 11/15/2018, starting around 1500 and she converted back to NSR around 1930 after being started on IV Cardizem. - she was symptomatic and denies any prior palpitations. No known history of atrial fibrillation. Suspect the episode was triggered by her recent septic shock and surgery. Echocardiogram is pending to assess LV function and would continue to follow on telemetry.  - she is currently on IV Cardizem at 2.5mg /hr while also remaining on PTA Bystolic. Will switch IV Cardizem to short-acting Cardizem 30mg  Q8H. Can hopefully consolidate to BB therapy alone prior to discharge. - This patients  CHA2DS2-VASc Score and unadjusted Ischemic Stroke Rate (% per year) is equal to 9.7 % stroke rate/year from a score of 6 (HTN, Female, Age (2), Prior TIA (2)). Given her short course of atrial fibrillation occurring in the post-operative setting, suspect she will not require anticoagulation at this time. Also anemic with Hgb at 8.6. If noted to have recurrences once further out from her hospitalization, would need to consider initiation at that time.   2. HTN - BP has been variable at 90/56 - 162/114 within the past 24 hours.  - she has been restarted on PTA Bystolic 10mg  daily and will transition IV Cardizem to  PO Cardizem as outlined above.   3. Stage 3 CKD - baseline creatinine 1.5 - 1.6. Peaked at 2.09 this admission, improved to 1.46 this AM.   4. Septic Shock in the Setting of Stercoral Ulcer with Perforation and Fecal Peritonitis - Underwent partial colectomy and placement of a colostomy on 11/12/2018. Required pressor support following surgery but BP has now stabilized.  - per General Surgery and Hospitalist Team.   For questions or updates, please contact [place] Please consult [email or web address] for contact info under Cardiology/STEMI.  Signed, Erma Heritage, PA-C 11/16/2018, 8:30 AM Pager: [phone]   [name] seen and discussed with PA Ahmed Prima, I agree with her documentation above. 78 yo female history of CKD 3, prior TIAs, HIV, HTN, COPD. [name] with history of abdominal pain. Found to have pneumoperitoneum of unknown etiology, went to ER for exlopration on Jan 18,2020 where she also had a descending colon resection and end colostomy due to sterocoral ulcer with perforation. Postop she developed afib and cardiology has been consulted  Received IV dilt 10mg  along with lopressor 5mg  IV x 1, then started on dilt gtt. Oral dilt started today.    K 3.9 Cr 1.46 Mg 2.1 WBC 8.6 Hgb 8.6 Plt 174 TSH pending EKG shows afib with RVR CXR no acute process Echo pending  New diagnosis of afib in postop period. She is back in SR, fairly short duration of afib <6 hrs, SR since around 730pm last night.  Continue rate control with oral dilt, remains on her home bystolic. CHADS2Vasc score is 60 (age x2, gender, TIA x 2, HTN). Given short duration of afib would not commit to anticoag at this time. She was very symptomatic during the episodes, no prior symptoms of palpitations in the past, suggesting this is first occurrence of afib. If recurrent afib would have to reconsider anticoag, decision would be affected also by her recent surgery and anemia.   We will follow up her telemetry  tomorrow.   Carlyle Dolly MD

## 2018-11-16 NOTE — Progress Notes (Signed)
PT Cancellation Note  Patient Details Name: [name] MRN: [id number] DOB: 1941-03-31   Cancelled Treatment:    Reason Eval/Treat Not Completed: Pain limiting ability to participate.  Per nursing staff patient having a lot of pain due to indwelling foley taken out awaiting pain medication.  Will check back tomorrow.   3:19 PM, 11/16/18 Lonell Grandchild, MPT Physical Therapist with [facility] [phone] office [phone] mobile phone

## 2018-11-16 NOTE — Progress Notes (Signed)
[facility] Progress Note  4 Days Post-Op  Subjective: Doing fair. Up to the chair. Drinking a lot of water for her sips. Ostomy with old clots in the bag. No gas. Not feeling bloated or nauseated. Wanting NG Out. HR better, off Dilt gtt. CXR with low volumes and some edema.   Objective: Vital signs in last 24 hours: Temp:  [98 F (36.7 C)-99.7 F (37.6 C)] 98 F (36.7 C) (01/22 1123) Pulse Rate:  [57-146] 61 (01/22 1200) Resp:  [13-31] 23 (01/22 1200) BP: (90-157)/(56-116) 155/69 (01/22 1200) SpO2:  [92 %-100 %] 97 % (01/22 1200) Weight:  [91.5 kg] 91.5 kg (01/22 0500) Last BM Date: 11/11/18  Intake/Output from previous day: 01/21 0701 - 01/22 0700 In: 1531 [I.V.:1199; IV Piggyback:332] Out: 5100 [Urine:4200; Emesis/NG output:900] Intake/Output this shift: Total I/O In: 24.1 [I.V.:4; IV Piggyback:20.1] Out: -   General appearance: alert, cooperative and no distress Resp: normal work breathing GI: soft, nondistended, appropriately tender, dressing in place, wound with fascia intact, clean, ostomy pink and edematous, clots in bag but not gas  Lab Results:  Recent Labs    11/15/18 0447 11/16/18 0415  WBC 10.8* 8.6  HGB 8.7* 8.6*  HCT 26.9* 27.5*  PLT 146* 174   BMET Recent Labs    11/15/18 0447 11/16/18 0415  NA 142 142  K 4.3 3.9  CL 113* 110  CO2 22 25  GLUCOSE 97 89  BUN 29* 26*  CREATININE 1.44* 1.46*  CALCIUM 8.8* 8.9   Studies/Results: Dg Chest Port 1 View  Result Date: 11/16/2018 CLINICAL DATA:  Pulmonary edema EXAM: PORTABLE CHEST 1 VIEW COMPARISON:  11/15/2018 FINDINGS: Cardiac shadow remains enlarged. Right jugular central line and nasogastric catheter are again seen and stable. Stable interstitial changes are noted similar to that seen on the prior exam. Mild right basilar atelectasis is noted. No bony abnormality is seen. IMPRESSION: No change from the previous exam. Electronically Signed   By: Inez Catalina M.D.   On: 11/16/2018  07:15   Dg Chest Port 1 View  Result Date: 11/15/2018 CLINICAL DATA:  Wheezing. Recent bowel resection for colonic perforation. EXAM: PORTABLE CHEST 1 VIEW COMPARISON:  11/12/2018 FINDINGS: Stable appearance gastric decompression tube and right jugular central line. Lungs show slightly lower volumes bilaterally with bibasilar atelectasis. Stable interstitial prominence which may be chronic. A component of mild interstitial edema can not be excluded. There may also be small bilateral pleural effusions. Follow-up PA and lateral chest x-ray may be helpful if there continue to be respiratory symptoms. No pneumothorax identified. The heart size is within normal limits. IMPRESSION: Lower lung volumes with bibasilar atelectasis and stable interstitial prominence. A component of mild interstitial edema and small bilateral pleural effusions cannot be excluded. Consider follow-up PA and lateral chest x-ray if there are persistent respiratory symptoms. Electronically Signed   By: Aletta Edouard M.D.   On: 11/15/2018 08:40    Anti-infectives: Anti-infectives (From admission, onward)   Start     Dose/Rate Route Frequency Ordered Stop   11/13/18 1000  abacavir-dolutegravir-lamiVUDine (TRIUMEQ) 600-50-300 MG per tablet 1 tablet     1 tablet Oral Daily 11/12/18 1936     11/13/18 1000  fluconazole (DIFLUCAN) tablet 150 mg     150 mg Oral Daily 11/12/18 1936     11/12/18 2200  piperacillin-tazobactam (ZOSYN) IVPB 3.375 g     3.375 g 12.5 mL/hr over 240 Minutes Intravenous Every 8 hours 11/12/18 1941 11/16/18 2159   11/12/18 1430  ceFEPIme (  MAXIPIME) 2 g in sodium chloride 0.9 % 100 mL IVPB     2 g 200 mL/hr over 30 Minutes Intravenous  Once 11/12/18 1428 11/12/18 1516   11/12/18 1430  metroNIDAZOLE (FLAGYL) IVPB 500 mg     500 mg 100 mL/hr over 60 Minutes Intravenous  Once 11/12/18 1428 11/12/18 1629      Assessment/Plan: Ms. Loncar is a 78 yo POD 4 s/p Ex lap, partial colectomy with end colostomy for  stercoral ulcer. Pathology confirms, no malignancy. Overall doing better. PRN For pain IS, OOB, PT to work with patient HD improving, A fib, On Dilt  NPO, NG out, and can have ice only until ostomy making output Dressing change ordered Labs look good, zosyn until POD 4 for intraabdominal infection SCDs, heparin sq Foley out, lasix given CXR, talked to Dr. Wynetta Emery Telemetry bed ordered  CVL out today  PT ordered   LOS: 4 days    Virl Cagey 11/16/2018

## 2018-11-16 NOTE — Progress Notes (Signed)
PROGRESS NOTE   [name]  IHK:[id number] DOB: 1941/08/06 DOA: 11/12/2018 PCP: Octavio Graves, DO    Brief Narrative:  78 year old female with a history of HIV, chronic kidney disease stage III, COPD, chronic pain, presents to the hospital with left-sided abdominal pain.  She was found to have perforation of her transverse colon on imaging.  She was admitted to the hospital under general surgery and underwent exploratory laparotomy.  Hospital service was asked to consult on the patient to help with medical management.  Assessment & Plan:   Principal Problem:   Large bowel perforation (HCC) Active Problems:   Human immunodeficiency virus (HIV) disease ([place])   Acute cystitis   HTN (hypertension)   CKD (chronic kidney disease) stage 3, GFR 30-59 ml/min (HCC)   COPD (chronic obstructive pulmonary disease) (HCC)   Hypothyroidism   Obesity (BMI 30-39.9)   Constipation due to opioid therapy   Lactic acidosis   Peritonitis (HCC)   Stercoral ulcer of large intestine  1. Stercoral ulcer with perforation and resultant fecal peritonitis.  Status post exploratory laparotomy, descending colon resection and end colostomy by general surgery.  Patient currently has NG tube in place.  She is on Zosyn.  Defer further postoperative care to general surgery.  2. Acute respiratory distress - RESOLVED. Lasix 40 mg IV ordered.  Repeat CXR in AM. Schedule Duonebs for wheezing.   3. Afib with RVR - Pt now back in SR, she has been seen by cardiology and will monitor for recurrence to decide about anticoagulation.  Pt now on diltiazem 30 mg TID.  4. Chronic kidney disease stage III.  Creatinine appears to be stable.  Continue to monitor urine output. 5. Septic shock.  RESOLVED.  Patient had been hypotensive with increased lactic acid secondary to peritonitis.  She is on IV hydration and was started on norepinephrine infusion.  Lactic acid has since started to trend down.  Blood pressures appear to be  improving.  Norepinephrine will likely be weaned off today.  Blood cultures have shown no growth.  We will continue IV antibiotics for now.   6. HIV.  Well Controlled.  Continue antiretrovirals.  Last viral load checked on 10/31/2018 was undetectable. 7. Hypertension.  Pt was resumed on her home nebivolol and now on diltiazem oral 30 mg TID 8. COPD.  No wheezing on exam.  Continue bronchodilators as needed. 9. Hypothyroidism.  Pt says she doesn't take any thyroid medications.  TSH/Free T4 pending.  10. Urinary tract infection.  TREATED.  UC: insignificant growth.    Subjective: Pt feels like she may have had some flatus.  No SOB this morning.  No palpitations.     Objective: Vitals:   11/16/18 0935 11/16/18 0945 11/16/18 1030 11/16/18 1123  BP: 105/71 120/89 (!) 130/116   Pulse: 67 66 65 63  Resp: (!) 27 (!) 24 (!) 22 13  Temp:    98 F (36.7 C)  TempSrc:    Oral  SpO2: 95% 95% 97% 98%  Weight:      Height:        Intake/Output Summary (Last 24 hours) at 11/16/2018 1132 Last data filed at 11/16/2018 0800 Gross per 24 hour  Intake 1555.04 ml  Output 5100 ml  Net -3544.96 ml   Filed Weights   11/13/18 0615 11/15/18 0500 11/16/18 0500  Weight: 98.1 kg 91.5 kg 91.5 kg    Examination:  General exam: Alert, awake, oriented x 3, NG tube in place Respiratory system: BBS mostly clear.  Cardiovascular system: normal s1,s2 sounds. No murmurs, rubs, gallops. Gastrointestinal system: Abdomen is nondistended, soft and tender to palpation.  No organomegaly or masses felt. hypoactive bowel sounds heard.  Colostomy left lower quadrant.  Midline wound, dressing was not removed Central nervous system: Alert and oriented. No focal neurological deficits. Extremities: No cyanosis or clubbing, pedal pulses palpated.  Skin: No rashes, lesions or ulcers Psychiatry: Judgement and insight appear normal. Mood & affect appropriate.   Data Reviewed: I have personally reviewed following labs and imaging  studies  CBC: Recent Labs  Lab 11/12/18 1240 11/13/18 0240 11/14/18 0441 11/15/18 0447 11/16/18 0415  WBC 8.7 15.5* 14.0* 10.8* 8.6  NEUTROABS 4.3 11.3* 10.8* 8.5* 5.7  HGB 14.1 12.2 9.2* 8.7* 8.6*  HCT 42.2 36.9 27.7* 26.9* 27.5*  MCV 101.0* 103.9* 105.3* 105.9* 107.8*  PLT 202 238 166 146* 742   Basic Metabolic Panel: Recent Labs  Lab 11/12/18 1240 11/13/18 0240 11/14/18 0441 11/15/18 0447 11/16/18 0415  NA 139 141 140 142 142  K 4.0 4.8 4.1 4.3 3.9  CL 107 114* 112* 113* 110  CO2 23 18* 21* 22 25  GLUCOSE 157* 159* 114* 97 89  BUN 18 30* 34* 29* 26*  CREATININE 1.78* 2.09* 1.83* 1.44* 1.46*  CALCIUM 10.0 8.7* 8.3* 8.8* 8.9  MG  --  1.3* 2.0 2.0 2.1  PHOS  --  0.7* 3.0 2.5 2.8   GFR: Estimated Creatinine Clearance: 35.4 mL/min (A) (by C-G formula based on SCr of 1.46 mg/dL (H)). Liver Function Tests: Recent Labs  Lab 11/12/18 1240  AST 24  ALT 20  ALKPHOS 64  BILITOT 1.3*  PROT 6.4*  ALBUMIN 4.1   Recent Labs  Lab 11/12/18 1240  LIPASE 24   No results for input(s): AMMONIA in the last 168 hours. Coagulation Profile: No results for input(s): INR, PROTIME in the last 168 hours. Cardiac Enzymes: No results for input(s): CKTOTAL, CKMB, CKMBINDEX, TROPONINI in the last 168 hours. BNP (last 3 results) No results for input(s): PROBNP in the last 8760 hours. HbA1C: No results for input(s): HGBA1C in the last 72 hours. CBG: No results for input(s): GLUCAP in the last 168 hours. Lipid Profile: No results for input(s): CHOL, HDL, LDLCALC, TRIG, CHOLHDL, LDLDIRECT in the last 72 hours. Thyroid Function Tests: No results for input(s): TSH, T4TOTAL, FREET4, T3FREE, THYROIDAB in the last 72 hours. Anemia Panel: No results for input(s): VITAMINB12, FOLATE, FERRITIN, TIBC, IRON, RETICCTPCT in the last 72 hours. Sepsis Labs: Recent Labs  Lab 11/12/18 1458 11/12/18 2025 11/13/18 0240  LATICACIDVEN 3.7* 5.8* 4.0*    Recent Results (from the past 240  hour(s))  Urine Culture     Status: Abnormal   Collection Time: 11/12/18 12:23 PM  Result Value Ref Range Status   Specimen Description   Final    URINE, CATHETERIZED Performed at [facility], [address] [place], [place] [address]    Special Requests   Final    NONE Performed at [facility], [address], [place], [place] [address]    Culture >=100,000 COLONIES/mL ESCHERICHIA COLI (A)  Final   Report Status 11/15/2018 FINAL  Final   Organism ID, Bacteria ESCHERICHIA COLI (A)  Final      Susceptibility   Escherichia coli - MIC*    AMPICILLIN <=2 SENSITIVE Sensitive     CEFAZOLIN <=4 SENSITIVE Sensitive     CEFTRIAXONE <=1 SENSITIVE Sensitive     CIPROFLOXACIN <=0.25 SENSITIVE Sensitive     GENTAMICIN <=1 SENSITIVE  Sensitive     IMIPENEM <=0.25 SENSITIVE Sensitive     NITROFURANTOIN <=16 SENSITIVE Sensitive     TRIMETH/SULFA <=20 SENSITIVE Sensitive     AMPICILLIN/SULBACTAM <=2 SENSITIVE Sensitive     PIP/TAZO <=4 SENSITIVE Sensitive     Extended ESBL NEGATIVE Sensitive     * >=100,000 COLONIES/mL ESCHERICHIA COLI  MRSA PCR Screening     Status: None   Collection Time: 11/12/18  8:00 PM  Result Value Ref Range Status   MRSA by PCR NEGATIVE NEGATIVE Final    Comment:        The GeneXpert MRSA Assay (FDA approved for NASAL specimens only), is one component of a comprehensive MRSA colonization surveillance program. It is not intended to diagnose MRSA infection nor to guide or monitor treatment for MRSA infections. Performed at [facility], [address], [place], [place] [address]   Culture, blood (routine x 2)     Status: None (Preliminary result)   Collection Time: 11/12/18  9:25 PM  Result Value Ref Range Status   Specimen Description A-LINE DRAWN BY RN  Final   Special Requests   Final    BOTTLES DRAWN AEROBIC AND ANAEROBIC Blood Culture adequate volume   Culture   Final    NO GROWTH 4 DAYS Performed at [facility], [address] [place], [place]  [address]    Report Status PENDING  Incomplete  Culture, Urine     Status: Abnormal   Collection Time: 11/12/18  9:30 PM  Result Value Ref Range Status   Specimen Description URINE, CATHETERIZED  Final   Special Requests   Final    NONE Performed at [facility], [address] [place], [place] [address]    Culture <10,000 COLONIES/mL INSIGNIFICANT GROWTH (A)  Final   Report Status 11/14/2018 FINAL  Final  Culture, blood (routine x 2)     Status: None (Preliminary result)   Collection Time: 11/12/18  9:37 PM  Result Value Ref Range Status   Specimen Description BLOOD LEFT HAND  Final   Special Requests   Final    BOTTLES DRAWN AEROBIC AND ANAEROBIC Blood Culture adequate volume   Culture   Final    NO GROWTH 4 DAYS Performed at [facility], [address] [place], [place] [address]    Report Status PENDING  Incomplete    Radiology Studies: Dg Chest Port 1 View  Result Date: 11/16/2018 CLINICAL DATA:  Pulmonary edema EXAM: PORTABLE CHEST 1 VIEW COMPARISON:  11/15/2018 FINDINGS: Cardiac shadow remains enlarged. Right jugular central line and nasogastric catheter are again seen and stable. Stable interstitial changes are noted similar to that seen on the prior exam. Mild right basilar atelectasis is noted. No bony abnormality is seen. IMPRESSION: No change from the previous exam. Electronically Signed   By: Inez Catalina M.D.   On: 11/16/2018 07:15   Dg Chest Port 1 View  Result Date: 11/15/2018 CLINICAL DATA:  Wheezing. Recent bowel resection for colonic perforation. EXAM: PORTABLE CHEST 1 VIEW COMPARISON:  11/12/2018 FINDINGS: Stable appearance gastric decompression tube and right jugular central line. Lungs show slightly lower volumes bilaterally with bibasilar atelectasis. Stable interstitial prominence which may be chronic. A component of mild interstitial edema can not be excluded. There may also be small bilateral pleural effusions. Follow-up PA and lateral chest x-ray may be helpful  if there continue to be respiratory symptoms. No pneumothorax identified. The heart size is within normal limits. IMPRESSION: Lower lung volumes with bibasilar atelectasis and stable interstitial  prominence. A component of mild interstitial edema and small bilateral pleural effusions cannot be excluded. Consider follow-up PA and lateral chest x-ray if there are persistent respiratory symptoms. Electronically Signed   By: Aletta Edouard M.D.   On: 11/15/2018 08:40   Scheduled Meds: . abacavir-dolutegravir-lamiVUDine  1 tablet Oral Daily  . chlorhexidine  15 mL Mouth Rinse BID  . Chlorhexidine Gluconate Cloth  6 each Topical Daily  . diltiazem  30 mg Oral Q8H  . docusate sodium  100 mg Oral BID  . fluconazole  150 mg Oral Daily  . fluticasone  2 spray Each Nare QHS  . heparin  5,000 Units Subcutaneous Q8H  . ipratropium-albuterol  3 mL Nebulization TID  . mouth rinse  15 mL Mouth Rinse q12n4p  . nebivolol  10 mg Oral Daily  . pantoprazole (PROTONIX) IV  40 mg Intravenous QHS  . sodium chloride flush  10-40 mL Intracatheter Q12H   Continuous Infusions: . lactated ringers Stopped (11/16/18 0536)  . piperacillin-tazobactam (ZOSYN)  IV 12.5 mL/hr at 11/16/18 0800    LOS: 4 days   Critical care: 31 mins  Rolan Wrightsman Wynetta Emery, MD [facility]  If 7PM-7AM, please contact night-coverage [email or web address]  11/16/2018, 11:32 AM

## 2018-11-16 NOTE — Care Management Important Message (Signed)
Important Message  Patient Details  Name: [name] MRN: [id number] Date of Birth: 09-04-1941   Medicare Important Message Given:  Yes    Shelda Altes 11/16/2018, 11:15 AM

## 2018-11-17 ENCOUNTER — Inpatient Hospital Stay (HOSPITAL_COMMUNITY): Payer: Medicare HMO

## 2018-11-17 DIAGNOSIS — I4891 Unspecified atrial fibrillation: Secondary | ICD-10-CM

## 2018-11-17 LAB — CBC WITH DIFFERENTIAL/PLATELET
Abs Immature Granulocytes: 0.26 10*3/uL — ABNORMAL HIGH (ref 0.00–0.07)
BASOS PCT: 1 %
Basophils Absolute: 0.1 10*3/uL (ref 0.0–0.1)
EOS ABS: 0.2 10*3/uL (ref 0.0–0.5)
Eosinophils Relative: 2 %
HCT: 30.7 % — ABNORMAL LOW (ref 36.0–46.0)
Hemoglobin: 9.7 g/dL — ABNORMAL LOW (ref 12.0–15.0)
Immature Granulocytes: 3 %
Lymphocytes Relative: 24 %
Lymphs Abs: 2 10*3/uL (ref 0.7–4.0)
MCH: 33.6 pg (ref 26.0–34.0)
MCHC: 31.6 g/dL (ref 30.0–36.0)
MCV: 106.2 fL — ABNORMAL HIGH (ref 80.0–100.0)
Monocytes Absolute: 0.8 10*3/uL (ref 0.1–1.0)
Monocytes Relative: 10 %
Neutro Abs: 5 10*3/uL (ref 1.7–7.7)
Neutrophils Relative %: 60 %
Platelets: 203 10*3/uL (ref 150–400)
RBC: 2.89 MIL/uL — ABNORMAL LOW (ref 3.87–5.11)
RDW: 12.6 % (ref 11.5–15.5)
WBC: 8.3 10*3/uL (ref 4.0–10.5)
nRBC: 0 % (ref 0.0–0.2)

## 2018-11-17 LAB — BASIC METABOLIC PANEL
Anion gap: 10 (ref 5–15)
BUN: 24 mg/dL — ABNORMAL HIGH (ref 8–23)
CO2: 26 mmol/L (ref 22–32)
Calcium: 9.3 mg/dL (ref 8.9–10.3)
Chloride: 107 mmol/L (ref 98–111)
Creatinine, Ser: 1.41 mg/dL — ABNORMAL HIGH (ref 0.44–1.00)
GFR calc Af Amer: 42 mL/min — ABNORMAL LOW (ref >60)
GFR calc non Af Amer: 36 mL/min — ABNORMAL LOW (ref >60)
Glucose, Bld: 88 mg/dL (ref 70–99)
Potassium: 3.9 mmol/L (ref 3.5–5.1)
Sodium: 143 mmol/L (ref 135–145)

## 2018-11-17 LAB — CULTURE, BLOOD (ROUTINE X 2)
Culture: NO GROWTH
Culture: NO GROWTH
Special Requests: ADEQUATE
Special Requests: ADEQUATE

## 2018-11-17 LAB — ECHOCARDIOGRAM COMPLETE
Height: 64 in
Weight: 3227.53 oz

## 2018-11-17 LAB — MAGNESIUM: Magnesium: 2.1 mg/dL (ref 1.7–2.4)

## 2018-11-17 LAB — PHOSPHORUS: Phosphorus: 2.2 mg/dL — ABNORMAL LOW (ref 2.5–4.6)

## 2018-11-17 MED ORDER — TRAMADOL HCL 50 MG PO TABS
50.0000 mg | ORAL_TABLET | Freq: Four times a day (QID) | ORAL | Status: DC | PRN
  Administered 2018-11-21: 50 mg via ORAL
  Filled 2018-11-17: qty 1

## 2018-11-17 MED ORDER — SODIUM PHOSPHATES 45 MMOLE/15ML IV SOLN
10.0000 mmol | Freq: Once | INTRAVENOUS | Status: AC
  Administered 2018-11-17: 10 mmol via INTRAVENOUS
  Filled 2018-11-17: qty 3.33

## 2018-11-17 MED ORDER — DILTIAZEM HCL 30 MG PO TABS
30.0000 mg | ORAL_TABLET | Freq: Two times a day (BID) | ORAL | Status: DC
  Administered 2018-11-17 – 2018-11-21 (×8): 30 mg via ORAL
  Filled 2018-11-17 (×8): qty 1

## 2018-11-17 NOTE — Evaluation (Signed)
Physical Therapy Evaluation Patient Details Name: [name] MRN: [id number] DOB: [birth year] Today's Date: 11/17/2018   History of Present Illness  Patient is a 78 year old female admitted 11/12/2018 with diagnosis of large bowel perforation. S/P exploratory laproscopy 11/12/2018. PMH: HIV, HTN, CKD, COPD, obesity, chronic pain.    Clinical Impression  Patient required minimal assistance to min guard for all mobility. Upon sitting and standing, patient complained of dizziness. Each time patient given time to recover. Patient declined walking around bed after first sit to stand transfer due to dizziness. On second sit to stand attempt, patient able to use RW to ambulate to recliner with min guard and assistance for line management. Patient limited by dizziness and fatigue.  Patient would continue to benefit from skilled physical therapy in current environment and next venue to continue return to prior function and increase strength, endurance, balance, coordination, and functional mobility and gait skills.     Follow Up Recommendations Home health PT;Supervision for mobility/OOB(Patient reporting she will be going to live with her son and daughter-in-law at discharge)    Equipment Recommendations  None recommended by PT    Recommendations for Other Services       Precautions / Restrictions Precautions Precautions: Fall Restrictions Weight Bearing Restrictions: No      Mobility  Bed Mobility Overal bed mobility: Needs Assistance Bed Mobility: Supine to Sit     Supine to sit: Min assist;HOB elevated        Transfers Overall transfer level: Needs assistance Equipment used: Rolling walker (2 wheeled) Transfers: Sit to/from [facility] Sit to Stand: Min assist Stand pivot transfers: Min guard       General transfer comment: dizzy upon standing initially, second attempt dizziness continued.  Ambulation/Gait Ambulation/Gait assistance: Min guard Gait  Distance (Feet): 4 Feet Assistive device: Rolling walker (2 wheeled) Gait Pattern/deviations: Step-to pattern;Decreased step length - right;Decreased step length - left;Decreased stride length;Shuffle Gait velocity: decreased   General Gait Details: slow, labored cadence w/ complaints of dizziness, limited by dizziness and fatigue  Stairs            Wheelchair Mobility    Modified Rankin (Stroke Patients Only)       Balance Overall balance assessment: Needs assistance Sitting-balance support: Bilateral upper extremity supported;Feet supported Sitting balance-Leahy Scale: Good     Standing balance support: Bilateral upper extremity supported;During functional activity Standing balance-Leahy Scale: Fair Standing balance comment: w/ RW                             Pertinent Vitals/Pain Pain Assessment: 0-10 Pain Score: 9  Pain Location: stomach Pain Descriptors / Indicators: Aching Pain Intervention(s): Limited activity within patient's tolerance;Patient requesting pain meds-RN notified;Monitored during session    Home Living Family/patient expects to be discharged to:: Private residence Living Arrangements: Non-relatives/Friends Available Help at Discharge: Available 24 hours/day Type of Home: Apartment Home Access: Level entry     Home Layout: One level Home Equipment: Clinical cytogeneticist - 2 wheels;Cane - quad;Bedside commode;Hand held shower head;Grab bars - tub/shower      Prior Function Level of Independence: Needs assistance   Gait / Transfers Assistance Needed: walked with cane or RW household distances  ADL's / Homemaking Assistance Needed: assistance for cooking, cleaning, laundry        Hand Dominance        Extremity/Trunk Assessment   Upper Extremity Assessment Upper Extremity Assessment: Generalized weakness  Lower Extremity Assessment Lower Extremity Assessment: Generalized weakness    Cervical / Trunk  Assessment Cervical / Trunk Assessment: Kyphotic  Communication   Communication: No difficulties  Cognition Arousal/Alertness: Awake/alert Behavior During Therapy: WFL for tasks assessed/performed Overall Cognitive Status: Within Functional Limits for tasks assessed                                        General Comments      Exercises     Assessment/Plan    PT Assessment Patient needs continued PT services  PT Problem List Decreased strength;Decreased mobility;Decreased activity tolerance;Decreased balance;Pain;Decreased knowledge of use of DME       PT Treatment Interventions DME instruction;Therapeutic activities;Gait training;Therapeutic exercise;Patient/family education;Balance training;Neuromuscular re-education    PT Goals (Current goals can be found in the Care Plan section)  Acute Rehab PT Goals Patient Stated Goal: go live with son and daughter-in-law at discharge. PT Goal Formulation: With patient Time For Goal Achievement: 12/01/18 Potential to Achieve Goals: Good    Frequency Min 3X/week   Barriers to discharge        Co-evaluation               AM-PAC PT "6 Clicks" Mobility  Outcome Measure Help needed turning from your back to your side while in a flat bed without using bedrails?: A Little Help needed moving from lying on your back to sitting on the side of a flat bed without using bedrails?: A Little Help needed moving to and from a bed to a chair (including a wheelchair)?: A Little Help needed standing up from a chair using your arms (e.g., wheelchair or bedside chair)?: A Little Help needed to walk in hospital room?: A Little Help needed climbing 3-5 steps with a railing? : A Little 6 Click Score: 18    End of Session Equipment Utilized During Treatment: Gait belt Activity Tolerance: Patient tolerated treatment well;Patient limited by fatigue Patient left: in chair;with call bell/phone within reach Nurse Communication:  Mobility status PT Visit Diagnosis: Unsteadiness on feet (R26.81);History of falling (Z91.81);Other abnormalities of gait and mobility (R26.89);Muscle weakness (generalized) (M62.81)    Time: 1030-1100 PT Time Calculation (min) (ACUTE ONLY): 30 min   Charges:   PT Evaluation $PT Eval Moderate Complexity: 1 Mod PT Treatments $Gait Training: 8-22 mins        Floria Raveling. Hartnett-Rands, MS, PT Per [place] [phone] 11/17/2018, 11:11 AM

## 2018-11-17 NOTE — Progress Notes (Addendum)
PROGRESS NOTE   [name]  QMG:[id number] DOB: 11-13-1940 DOA: 11/12/2018 PCP: Octavio Graves, DO    Brief Narrative:  78 year old female with a history of HIV, chronic kidney disease stage III, COPD, chronic pain, presents to the hospital with left-sided abdominal pain.  She was found to have perforation of her transverse colon on imaging.  She was admitted to the hospital under general surgery and underwent exploratory laparotomy.  Hospital service was asked to consult on the patient to help with medical management.  Assessment & Plan:   Principal Problem:   Large bowel perforation (HCC) Active Problems:   Human immunodeficiency virus (HIV) disease ([place])   Acute cystitis   HTN (hypertension)   CKD (chronic kidney disease) stage 3, GFR 30-59 ml/min (HCC)   COPD (chronic obstructive pulmonary disease) (HCC)   Hypothyroidism   Obesity (BMI 30-39.9)   Constipation due to opioid therapy   Lactic acidosis   Peritonitis (HCC)   Stercoral ulcer of large intestine  1. Stercoral ulcer with perforation and resultant fecal peritonitis.  Status post exploratory laparotomy, descending colon resection and end colostomy by general surgery.  NG removed. Defer further postoperative care to general surgery.  2. Acute respiratory distress - RESOLVED. Lasix 40 mg IV was given.  Repeat CXR in AM. Schedule Duonebs for wheezing.   3. Afib with RVR - Pt now back in SR, she has been seen by cardiology and will monitor for recurrence to decide about anticoagulation.  Pt now on diltiazem 30 mg BID.  4. Chronic kidney disease stage III.  Creatinine appears to be stable.  Continue to monitor urine output. 5. Septic shock.  RESOLVED.  Patient had been hypotensive with increased lactic acid secondary to peritonitis.  She is off norepinephrine infusion.  Blood cultures have shown no growth.  6. HIV.  Well Controlled.  Continue antiretrovirals.  Last viral load checked on 10/31/2018 was undetectable. 7. Low  phosphorus - IV replacement being given.  Follow phos levels.   8. Hypertension.  Pt was resumed on her home nebivolol and now on diltiazem oral 30 mg TID.   9. COPD.  Wheezing much improved now. Continue bronchodilators as needed. 10. Subclinical Hypothyroidism.  Pt says she doesn't take any thyroid medications.  Free T4 within normal limits.  11. Urinary tract infection.  TREATED.  UC: insignificant growth.    Subjective: Pt reports flatus.  Pain better controlled this morning. No SOB this morning.  No palpitations.     Objective: Vitals:   11/17/18 0300 11/17/18 0400 11/17/18 0500 11/17/18 0600  BP: (!) 184/70 (!) 185/67 (!) 174/74 (!) 173/68  Pulse: 63 66 66 75  Resp: (!) 25 (!) 22 (!) 27 (!) 23  Temp:      TempSrc:      SpO2: 95% 100% 95% 100%  Weight:      Height:        Intake/Output Summary (Last 24 hours) at 11/17/2018 0713 Last data filed at 11/17/2018 0200 Gross per 24 hour  Intake 699.3 ml  Output 350 ml  Net 349.3 ml   Filed Weights   11/13/18 0615 11/15/18 0500 11/16/18 0500  Weight: 98.1 kg 91.5 kg 91.5 kg    Examination:  General exam: Alert, awake, oriented x 3.  Respiratory system: BBS CTA.  Cardiovascular system: normal s1,s2 sounds. No murmurs, rubs, gallops. Gastrointestinal system: Abdomen is nondistended, soft and tender to palpation.  No organomegaly or masses felt. hypoactive bowel sounds heard.  Colostomy left lower quadrant.  Midline wound, dressing was not removed Central nervous system: Alert and oriented. No focal neurological deficits. Extremities: No cyanosis or clubbing, pedal pulses palpated.  Skin: No rashes, lesions or ulcers Psychiatry: Judgement and insight appear normal. Mood & affect appropriate.   Data Reviewed: I have personally reviewed following labs and imaging studies  CBC: Recent Labs  Lab 11/13/18 0240 11/14/18 0441 11/15/18 0447 11/16/18 0415 11/17/18 0437  WBC 15.5* 14.0* 10.8* 8.6 8.3  NEUTROABS 11.3* 10.8* 8.5*  5.7 5.0  HGB 12.2 9.2* 8.7* 8.6* 9.7*  HCT 36.9 27.7* 26.9* 27.5* 30.7*  MCV 103.9* 105.3* 105.9* 107.8* 106.2*  PLT 238 166 146* 174 779   Basic Metabolic Panel: Recent Labs  Lab 11/13/18 0240 11/14/18 0441 11/15/18 0447 11/16/18 0415 11/17/18 0437  NA 141 140 142 142 143  K 4.8 4.1 4.3 3.9 3.9  CL 114* 112* 113* 110 107  CO2 18* 21* 22 25 26   GLUCOSE 159* 114* 97 89 88  BUN 30* 34* 29* 26* 24*  CREATININE 2.09* 1.83* 1.44* 1.46* 1.41*  CALCIUM 8.7* 8.3* 8.8* 8.9 9.3  MG 1.3* 2.0 2.0 2.1 2.1  PHOS 0.7* 3.0 2.5 2.8 2.2*   GFR: Estimated Creatinine Clearance: 36.6 mL/min (A) (by C-G formula based on SCr of 1.41 mg/dL (H)). Liver Function Tests: Recent Labs  Lab 11/12/18 1240  AST 24  ALT 20  ALKPHOS 64  BILITOT 1.3*  PROT 6.4*  ALBUMIN 4.1   Recent Labs  Lab 11/12/18 1240  LIPASE 24   No results for input(s): AMMONIA in the last 168 hours. Coagulation Profile: No results for input(s): INR, PROTIME in the last 168 hours. Cardiac Enzymes: No results for input(s): CKTOTAL, CKMB, CKMBINDEX, TROPONINI in the last 168 hours. BNP (last 3 results) No results for input(s): PROBNP in the last 8760 hours. HbA1C: No results for input(s): HGBA1C in the last 72 hours. CBG: No results for input(s): GLUCAP in the last 168 hours. Lipid Profile: No results for input(s): CHOL, HDL, LDLCALC, TRIG, CHOLHDL, LDLDIRECT in the last 72 hours. Thyroid Function Tests: Recent Labs    11/16/18 1058  TSH 4.603*  FREET4 0.91   Anemia Panel: No results for input(s): VITAMINB12, FOLATE, FERRITIN, TIBC, IRON, RETICCTPCT in the last 72 hours. Sepsis Labs: Recent Labs  Lab 11/12/18 1458 11/12/18 2025 11/13/18 0240  LATICACIDVEN 3.7* 5.8* 4.0*    Recent Results (from the past 240 hour(s))  Urine Culture     Status: Abnormal   Collection Time: 11/12/18 12:23 PM  Result Value Ref Range Status   Specimen Description   Final    URINE, CATHETERIZED Performed at [facility], [address] [place], [place] [address]    Special Requests   Final    NONE Performed at [facility], [address], [place], [place] [address]    Culture >=100,000 COLONIES/mL ESCHERICHIA COLI (A)  Final   Report Status 11/15/2018 FINAL  Final   Organism ID, Bacteria ESCHERICHIA COLI (A)  Final      Susceptibility   Escherichia coli - MIC*    AMPICILLIN <=2 SENSITIVE Sensitive     CEFAZOLIN <=4 SENSITIVE Sensitive     CEFTRIAXONE <=1 SENSITIVE Sensitive     CIPROFLOXACIN <=0.25 SENSITIVE Sensitive     GENTAMICIN <=1 SENSITIVE Sensitive     IMIPENEM <=0.25 SENSITIVE Sensitive     NITROFURANTOIN <=16 SENSITIVE Sensitive     TRIMETH/SULFA <=20 SENSITIVE Sensitive     AMPICILLIN/SULBACTAM <=2 SENSITIVE Sensitive     PIP/TAZO <=  4 SENSITIVE Sensitive     Extended ESBL NEGATIVE Sensitive     * >=100,000 COLONIES/mL ESCHERICHIA COLI  MRSA PCR Screening     Status: None   Collection Time: 11/12/18  8:00 PM  Result Value Ref Range Status   MRSA by PCR NEGATIVE NEGATIVE Final    Comment:        The GeneXpert MRSA Assay (FDA approved for NASAL specimens only), is one component of a comprehensive MRSA colonization surveillance program. It is not intended to diagnose MRSA infection nor to guide or monitor treatment for MRSA infections. Performed at [facility], [address], [place], [place] [address]   Culture, blood (routine x 2)     Status: None (Preliminary result)   Collection Time: 11/12/18  9:25 PM  Result Value Ref Range Status   Specimen Description A-LINE DRAWN BY RN  Final   Special Requests   Final    BOTTLES DRAWN AEROBIC AND ANAEROBIC Blood Culture adequate volume   Culture   Final    NO GROWTH 4 DAYS Performed at [facility], [address], [place], [place] [address]    Report Status PENDING  Incomplete  Culture, Urine     Status: Abnormal   Collection Time: 11/12/18  9:30 PM  Result Value Ref Range Status   Specimen Description URINE, CATHETERIZED   Final   Special Requests   Final    NONE Performed at [facility], [address] [place], [place] [address]    Culture <10,000 COLONIES/mL INSIGNIFICANT GROWTH (A)  Final   Report Status 11/14/2018 FINAL  Final  Culture, blood (routine x 2)     Status: None (Preliminary result)   Collection Time: 11/12/18  9:37 PM  Result Value Ref Range Status   Specimen Description BLOOD LEFT HAND  Final   Special Requests   Final    BOTTLES DRAWN AEROBIC AND ANAEROBIC Blood Culture adequate volume   Culture   Final    NO GROWTH 4 DAYS Performed at [facility], [address], [place], [place] [address]    Report Status PENDING  Incomplete    Radiology Studies: Dg Chest Port 1 View  Result Date: 11/16/2018 CLINICAL DATA:  Pulmonary edema EXAM: PORTABLE CHEST 1 VIEW COMPARISON:  11/15/2018 FINDINGS: Cardiac shadow remains enlarged. Right jugular central line and nasogastric catheter are again seen and stable. Stable interstitial changes are noted similar to that seen on the prior exam. Mild right basilar atelectasis is noted. No bony abnormality is seen. IMPRESSION: No change from the previous exam. Electronically Signed   By: Inez Catalina M.D.   On: 11/16/2018 07:15   Dg Chest Port 1 View  Result Date: 11/15/2018 CLINICAL DATA:  Wheezing. Recent bowel resection for colonic perforation. EXAM: PORTABLE CHEST 1 VIEW COMPARISON:  11/12/2018 FINDINGS: Stable appearance gastric decompression tube and right jugular central line. Lungs show slightly lower volumes bilaterally with bibasilar atelectasis. Stable interstitial prominence which may be chronic. A component of mild interstitial edema can not be excluded. There may also be small bilateral pleural effusions. Follow-up PA and lateral chest x-ray may be helpful if there continue to be respiratory symptoms. No pneumothorax identified. The heart size is within normal limits. IMPRESSION: Lower lung volumes with bibasilar atelectasis and stable interstitial  prominence. A component of mild interstitial edema and small bilateral pleural effusions cannot be excluded. Consider follow-up PA and lateral chest x-ray if there are persistent respiratory symptoms. Electronically Signed   By: Jenness Corner.D.  On: 11/15/2018 08:40   Scheduled Meds: . abacavir-dolutegravir-lamiVUDine  1 tablet Oral Daily  . chlorhexidine  15 mL Mouth Rinse BID  . Chlorhexidine Gluconate Cloth  6 each Topical Daily  . diltiazem  30 mg Oral Q8H  . docusate sodium  100 mg Oral BID  . fluconazole  150 mg Oral Daily  . fluticasone  2 spray Each Nare QHS  . heparin  5,000 Units Subcutaneous Q8H  . mouth rinse  15 mL Mouth Rinse q12n4p  . nebivolol  10 mg Oral Daily  . pantoprazole (PROTONIX) IV  40 mg Intravenous QHS  . sodium chloride flush  10-40 mL Intracatheter Q12H   Continuous Infusions: . lactated ringers 50 mL/hr at 11/17/18 0200    LOS: 5 days   Irwin Brakeman, MD [facility]  If 7PM-7AM, please contact night-coverage [email or web address]  11/17/2018, 7:13 AM

## 2018-11-17 NOTE — Plan of Care (Signed)
  Problem: Acute Rehab PT Goals(only PT should resolve) Goal: Pt will Roll Supine to Side Outcome: Progressing Flowsheets (Taken 11/17/2018 1115) Pt will Roll Supine to Side: with min assist Goal: Pt Will Go Supine/Side To Sit Outcome: Progressing Flowsheets (Taken 11/17/2018 1115) Pt will go Supine/Side to Sit: with min guard assist Goal: Pt Will Go Sit To Supine/Side Outcome: Progressing Flowsheets (Taken 11/17/2018 1115) Pt will go Sit to Supine/Side: with min guard assist Goal: Patient Will Transfer Sit To/From Stand Outcome: Progressing Flowsheets (Taken 11/17/2018 1115) Patient will transfer sit to/from stand: with min guard assist Goal: Pt Will Transfer Bed To Chair/Chair To Bed Outcome: Progressing Flowsheets (Taken 11/17/2018 1115) Pt will Transfer Bed to Chair/Chair to Bed: min guard assist Goal: Pt Will Ambulate Outcome: Progressing Flowsheets (Taken 11/17/2018 1115) Pt will Ambulate: 50 feet; with min guard assist; with least restrictive assistive device Goal: Pt/caregiver will Perform Home Exercise Program Outcome: Progressing Flowsheets (Taken 11/17/2018 1115) Pt/caregiver will Perform Home Exercise Program: For increased strengthening; For improved balance; With minimal assist   Floria Raveling. Hartnett-Rands, MS, PT Per [place] [phone] 11/17/2018

## 2018-11-17 NOTE — Progress Notes (Signed)
*  PRELIMINARY RESULTS* Echocardiogram 2D Echocardiogram has been performed.  [name] 11/17/2018, 3:29 PM

## 2018-11-17 NOTE — Progress Notes (Signed)
Telemetry reviewed today, remains in sinus rhythm without any recurrent afib. She is on bystolic (her home regimen) and diltiazem started this admission. Change to dilt 30mg  bid for convenience of taking, would continue this at discharge, likely look to stop dilt at outpatient follow up. As she gets further out from the postop period her drive for afib should continue to resolve. No plans for anticoag at this time. We will sign off inpatient care, call us again if needed. We will f/u her echo results   [place] will sign off.   Medication Recommendations:  Diltiazem 30mg  bid Other recommendations (labs, testing, etc):  none Follow up as an outpatient:  We will arrange outpatient f/u in 3-4 weeks.

## 2018-11-17 NOTE — Progress Notes (Signed)
[facility] NOTE  Pharmacy Consult for electrolyte replacement Indication: abnormal electrolytes    Patient Measurements: Height: 5\' 4"  (162.6 cm) Weight: 201 lb 11.5 oz (91.5 kg) IBW/kg (Calculated) : 54.7   Vital Signs: Temp: 98.5 F (36.9 C) (01/23 1110) Temp Source: Oral (01/23 1110) BP: 149/74 (01/23 1100) Pulse Rate: 69 (01/23 1110) Intake/Output from previous day: 01/22 0701 - 01/23 0700 In: 949.3 [I.V.:873.3; IV Piggyback:76] Out: 350 [Urine:350] Intake/Output from this shift: No intake/output data recorded.  Labs: Recent Labs    11/15/18 0447 11/16/18 0415 11/17/18 0437  WBC 10.8* 8.6 8.3  HGB 8.7* 8.6* 9.7*  HCT 26.9* 27.5* 30.7*  PLT 146* 174 203  CREATININE 1.44* 1.46* 1.41*  MG 2.0 2.1 2.1  PHOS 2.5 2.8 2.2*   Estimated Creatinine Clearance: 36.6 mL/min (A) (by C-G formula based on SCr of 1.41 mg/dL (H)).    Medical History: Past Medical History:  Diagnosis Date  . Anxiety   . Arthritis   . Bronchitis   . CKD (chronic kidney disease) stage 3, GFR 30-59 ml/min (HCC) 05/04/2016  . COPD (chronic obstructive pulmonary disease) ([place])   . Depression   . Elevated LFTs 05/04/2016  . History of TIAs 1981   left side weakness  . HIV (human immunodeficiency virus infection) ([place])   . Hypertension   . Kidney disease related to HIV infection [facility])    pt states she has to have her creatinine level checked often   . Nausea 03/03/2017  . Peripheral neuropathy    bilateral feet  . Sinusitis 05/06/2018  . UTI (urinary tract infection) 03/03/2017  . Weight loss 03/03/2017     Assessment: Pharmacy consulted to replace electrolytes in patient admitted for hypotension, septic shock from fecal peritonitis.  Patient is 3 days postop now. Patient has renal dysfunction.  Goal of Therapy:  Replace electrolytes   Plan:  Potassium, magnesium at goal Replace phosphorus of  2.2 with  sodium phosphate 10 mmol x 1 dose.    Despina Pole 11/17/2018,2:00  PM

## 2018-11-17 NOTE — Progress Notes (Signed)
[facility] Progress Note  5 Days Post-Op  Subjective: Doing well. NG has been out and ostomy with dark maroon output and gas. No nausea.  Foley and CVL out yesterday.   Objective: Vital signs in last 24 hours: Temp:  [97.5 F (36.4 C)-98.5 F (36.9 C)] 97.5 F (36.4 C) (01/23 1610) Pulse Rate:  [58-76] 58 (01/23 1610) Resp:  [14-27] 20 (01/23 1610) BP: (149-185)/(67-98) 149/74 (01/23 1100) SpO2:  [93 %-100 %] 100 % (01/23 1610) FiO2 (%):  [28 %] 28 % (01/23 0000) Last BM Date: 11/11/18  Intake/Output from previous day: 01/22 0701 - 01/23 0700 In: 949.3 [I.V.:873.3; IV Piggyback:76] Out: 350 [Urine:350] Intake/Output this shift: No intake/output data recorded.  General appearance: alert, cooperative and no distress Resp: normal work breathing GI: soft, nondistended, appropriately tender, wound packing clean and intact, ostomy with maroon output and gas  Lab Results:  Recent Labs    11/16/18 0415 11/17/18 0437  WBC 8.6 8.3  HGB 8.6* 9.7*  HCT 27.5* 30.7*  PLT 174 203   BMET Recent Labs    11/16/18 0415 11/17/18 0437  NA 142 143  K 3.9 3.9  CL 110 107  CO2 25 26  GLUCOSE 89 88  BUN 26* 24*  CREATININE 1.46* 1.41*  CALCIUM 8.9 9.3    Studies/Results: Dg Chest Port 1 View  Result Date: 11/16/2018 CLINICAL DATA:  Pulmonary edema EXAM: PORTABLE CHEST 1 VIEW COMPARISON:  11/15/2018 FINDINGS: Cardiac shadow remains enlarged. Right jugular central line and nasogastric catheter are again seen and stable. Stable interstitial changes are noted similar to that seen on the prior exam. Mild right basilar atelectasis is noted. No bony abnormality is seen. IMPRESSION: No change from the previous exam. Electronically Signed   By: Inez Catalina M.D.   On: 11/16/2018 07:15    Anti-infectives: Anti-infectives (From admission, onward)   Start     Dose/Rate Route Frequency Ordered Stop   11/13/18 1000  abacavir-dolutegravir-lamiVUDine (TRIUMEQ) 600-50-300 MG  per tablet 1 tablet     1 tablet Oral Daily 11/12/18 1936     11/13/18 1000  fluconazole (DIFLUCAN) tablet 150 mg     150 mg Oral Daily 11/12/18 1936     11/12/18 2200  piperacillin-tazobactam (ZOSYN) IVPB 3.375 g     3.375 g 12.5 mL/hr over 240 Minutes Intravenous Every 8 hours 11/12/18 1941 11/16/18 1808   11/12/18 1430  ceFEPIme (MAXIPIME) 2 g in sodium chloride 0.9 % 100 mL IVPB     2 g 200 mL/hr over 30 Minutes Intravenous  Once 11/12/18 1428 11/12/18 1516   11/12/18 1430  metroNIDAZOLE (FLAGYL) IVPB 500 mg     500 mg 100 mL/hr over 60 Minutes Intravenous  Once 11/12/18 1428 11/12/18 1629      Assessment/Plan: Ms. Perkin is a 78 yo POD 5 s/p Ex lap, partial colectomy and end colostomy for stercoral ulcer. Doing fair.  PRN for pain added tramadol for PO option  IS, OOB, PT working with patient and recommend home PT  Clear diet HD improving, cardiology will see as outpatient for A fib related to surgery Dressing change daily Ostomy RN working with patient SCDs, heparin sq  Once on diet will be able to d/c in next few days    LOS: 5 days    Virl Cagey 11/17/2018

## 2018-11-18 LAB — CBC
HCT: 30.5 % — ABNORMAL LOW (ref 36.0–46.0)
Hemoglobin: 9.8 g/dL — ABNORMAL LOW (ref 12.0–15.0)
MCH: 33.8 pg (ref 26.0–34.0)
MCHC: 32.1 g/dL (ref 30.0–36.0)
MCV: 105.2 fL — AB (ref 80.0–100.0)
Platelets: 248 10*3/uL (ref 150–400)
RBC: 2.9 MIL/uL — ABNORMAL LOW (ref 3.87–5.11)
RDW: 12.6 % (ref 11.5–15.5)
WBC: 9.6 10*3/uL (ref 4.0–10.5)
nRBC: 0 % (ref 0.0–0.2)

## 2018-11-18 LAB — COMPREHENSIVE METABOLIC PANEL
ALT: 16 U/L (ref 0–44)
AST: 20 U/L (ref 15–41)
Albumin: 2.7 g/dL — ABNORMAL LOW (ref 3.5–5.0)
Alkaline Phosphatase: 61 U/L (ref 38–126)
Anion gap: 8 (ref 5–15)
BUN: 20 mg/dL (ref 8–23)
CO2: 26 mmol/L (ref 22–32)
Calcium: 9.2 mg/dL (ref 8.9–10.3)
Chloride: 106 mmol/L (ref 98–111)
Creatinine, Ser: 1.18 mg/dL — ABNORMAL HIGH (ref 0.44–1.00)
GFR calc Af Amer: 52 mL/min — ABNORMAL LOW (ref >60)
GFR, EST NON AFRICAN AMERICAN: 44 mL/min — AB (ref >60)
Glucose, Bld: 113 mg/dL — ABNORMAL HIGH (ref 70–99)
Potassium: 3.3 mmol/L — ABNORMAL LOW (ref 3.5–5.1)
Sodium: 140 mmol/L (ref 135–145)
Total Bilirubin: 0.9 mg/dL (ref 0.3–1.2)
Total Protein: 6 g/dL — ABNORMAL LOW (ref 6.5–8.1)

## 2018-11-18 LAB — MAGNESIUM: MAGNESIUM: 2 mg/dL (ref 1.7–2.4)

## 2018-11-18 LAB — PHOSPHORUS: Phosphorus: 2 mg/dL — ABNORMAL LOW (ref 2.5–4.6)

## 2018-11-18 MED ORDER — POTASSIUM CHLORIDE 10 MEQ/100ML IV SOLN
10.0000 meq | INTRAVENOUS | Status: AC
  Administered 2018-11-18 (×3): 10 meq via INTRAVENOUS
  Filled 2018-11-18 (×3): qty 100

## 2018-11-18 MED ORDER — HYDRALAZINE HCL 25 MG PO TABS
25.0000 mg | ORAL_TABLET | Freq: Three times a day (TID) | ORAL | Status: DC
  Administered 2018-11-18 – 2018-11-19 (×4): 25 mg via ORAL
  Filled 2018-11-18 (×4): qty 1

## 2018-11-18 MED ORDER — POTASSIUM CHLORIDE 10 MEQ/100ML IV SOLN: 10.0000 meq | INTRAVENOUS | Status: DC

## 2018-11-18 MED ORDER — SODIUM PHOSPHATES 45 MMOLE/15ML IV SOLN
10.0000 mmol | Freq: Once | INTRAVENOUS | Status: AC
  Administered 2018-11-18: 10 mmol via INTRAVENOUS
  Filled 2018-11-18: qty 3.33

## 2018-11-18 MED ORDER — SODIUM PHOSPHATES 45 MMOLE/15ML IV SOLN: 20.0000 mmol | Freq: Once | INTRAVENOUS | Status: DC

## 2018-11-18 NOTE — Progress Notes (Signed)
[facility] Progress Note  6 Days Post-Op  Subjective: No major issues. Feeling full. Ostomy with some output. Feeling tired. Worked with PT.   Objective: Vital signs in last 24 hours: Temp:  [98.3 F (36.8 C)] 98.3 F (36.8 C) (01/24 1537) Pulse Rate:  [59-81] 63 (01/24 1537) Resp:  [16-26] 17 (01/24 1537) BP: (146-186)/(69-92) 155/78 (01/24 1537) SpO2:  [87 %-98 %] 95 % (01/24 2033) FiO2 (%):  [28 %] 28 % (01/23 2200) Last BM Date: 11/11/18  Intake/Output from previous day: 01/23 0701 - 01/24 0700 In: 769.9 [I.V.:529.9; IV Piggyback:239.9] Out: 600 [Stool:600] Intake/Output this shift: No intake/output data recorded.  General appearance: alert, cooperative and no distress Resp: normal work breathing GI: soft, nondistended, appropriately tender, wound packed clean, ostomy with output, some gas in bag  Lab Results:  Recent Labs    11/17/18 0437 11/18/18 0420  WBC 8.3 9.6  HGB 9.7* 9.8*  HCT 30.7* 30.5*  PLT 203 248   BMET Recent Labs    11/17/18 0437 11/18/18 0420  NA 143 140  K 3.9 3.3*  CL 107 106  CO2 26 26  GLUCOSE 88 113*  BUN 24* 20  CREATININE 1.41* 1.18*  CALCIUM 9.3 9.2    Anti-infectives: Anti-infectives (From admission, onward)   Start     Dose/Rate Route Frequency Ordered Stop   11/13/18 1000  abacavir-dolutegravir-lamiVUDine (TRIUMEQ) 600-50-300 MG per tablet 1 tablet     1 tablet Oral Daily 11/12/18 1936     11/13/18 1000  fluconazole (DIFLUCAN) tablet 150 mg  Status:  Discontinued     150 mg Oral Daily 11/12/18 1936 11/18/18 1805   11/12/18 2200  piperacillin-tazobactam (ZOSYN) IVPB 3.375 g     3.375 g 12.5 mL/hr over 240 Minutes Intravenous Every 8 hours 11/12/18 1941 11/16/18 1808   11/12/18 1430  ceFEPIme (MAXIPIME) 2 g in sodium chloride 0.9 % 100 mL IVPB     2 g 200 mL/hr over 30 Minutes Intravenous  Once 11/12/18 1428 11/12/18 1516   11/12/18 1430  metroNIDAZOLE (FLAGYL) IVPB 500 mg     500 mg 100 mL/hr over  60 Minutes Intravenous  Once 11/12/18 1428 11/12/18 1629      Assessment/Plan: [name] is a 78 yo POD 6 s/p Ex lap, partial colectomy and end colostomy for stercoral ulcer. Doing fair.  PRN for pain IS, OOB, working with PT, needs home PT  Wound care going well with daughter in law Full liquid diet HD ok SCDs, heparin sq Dc in the next few days with PT and Home health RN for ostomy care    LOS: 6 days    [name] 11/18/2018

## 2018-11-18 NOTE — Progress Notes (Signed)
Physical Therapy Treatment Patient Details Name: [name] MRN: [id number] DOB: 10-Jun-1941 Today's Date: 11/18/2018    History of Present Illness Patient is a 78 year old female admitted 11/12/2018 with diagnosis of large bowel perforation. S/P exploratory laproscopy 11/12/2018. PMH: HIV, HTN, CKD, COPD, obesity, chronic pain    PT Comments    Patient demonstrates increased endurance/distance for gait training with slow labored cadence without loss of balance while on room air with O2 saturation >93% after ambulation.  Patient required demonstration and verbal cues to complete BLE ROM/strengthening exercises while seated at bedside, transferred to [facility] to urinate prior to gait training and tolerated sitting up in chair afterwards with family members present in room.  Patient will benefit from continued physical therapy in hospital and recommended venue below to increase strength, balance, endurance for safe ADLs and gait.    Follow Up Recommendations  Home health PT;Supervision for mobility/OOB     Equipment Recommendations  None recommended by PT    Recommendations for Other Services       Precautions / Restrictions Precautions Precautions: Fall Restrictions Weight Bearing Restrictions: No    Mobility  Bed Mobility Overal bed mobility: Needs Assistance Bed Mobility: Supine to Sit     Supine to sit: Min assist     General bed mobility comments: increased time, labored movement, fair/good return for propping on elbows for supine to sitting  Transfers Overall transfer level: Needs assistance Equipment used: Rolling walker (2 wheeled);None;1 person hand held assist Transfers: Sit to/from [facility] Sit to Stand: Min assist Stand pivot transfers: Min assist       General transfer comment: able to transfer to [facility], then to chair leaning on armrest of BSC and chair, safer using RW  Ambulation/Gait Ambulation/Gait assistance: Min assist;Min  guard Gait Distance (Feet): 35 Feet Assistive device: Rolling walker (2 wheeled) Gait Pattern/deviations: Decreased step length - right;Decreased step length - left;Decreased stride length Gait velocity: decreased   General Gait Details: slow slightly labored cadence without loss of balance, limited secondary to c/o fatigue   Stairs             Wheelchair Mobility    Modified Rankin (Stroke Patients Only)       Balance Overall balance assessment: Needs assistance Sitting-balance support: Feet supported;No upper extremity supported Sitting balance-Leahy Scale: Good     Standing balance support: Bilateral upper extremity supported;During functional activity Standing balance-Leahy Scale: Fair Standing balance comment: using RW                            Cognition Arousal/Alertness: Awake/alert Behavior During Therapy: WFL for tasks assessed/performed Overall Cognitive Status: Within Functional Limits for tasks assessed                                        Exercises General Exercises - Lower Extremity Long Arc Quad: Seated;AROM;Strengthening;Both;10 reps Hip ABduction/ADduction: Strengthening;AROM;Seated;Both;10 reps Straight Leg Raises: Seated;AROM;Strengthening;Both;10 reps Hip Flexion/Marching: Seated;AROM;Strengthening;Both;10 reps    General Comments        Pertinent Vitals/Pain Pain Assessment: No/denies pain    Home Living                      Prior Function            PT Goals (current goals can now be found in the care  plan section) Acute Rehab PT Goals Patient Stated Goal: go live with son and daughter-in-law at discharge. PT Goal Formulation: With patient Time For Goal Achievement: 12/01/18 Potential to Achieve Goals: Good Progress towards PT goals: Progressing toward goals    Frequency    Min 3X/week      PT Plan Current plan remains appropriate    Co-evaluation              AM-PAC  PT "6 Clicks" Mobility   Outcome Measure  Help needed turning from your back to your side while in a flat bed without using bedrails?: A Little Help needed moving from lying on your back to sitting on the side of a flat bed without using bedrails?: A Little Help needed moving to and from a bed to a chair (including a wheelchair)?: A Little Help needed standing up from a chair using your arms (e.g., wheelchair or bedside chair)?: A Little Help needed to walk in hospital room?: A Little Help needed climbing 3-5 steps with a railing? : A Lot 6 Click Score: 17    End of Session Equipment Utilized During Treatment: Gait belt Activity Tolerance: Patient tolerated treatment well;Patient limited by fatigue;Patient limited by lethargy Patient left: in chair;with call bell/phone within reach;with family/visitor present Nurse Communication: Mobility status PT Visit Diagnosis: Unsteadiness on feet (R26.81);History of falling (Z91.81);Other abnormalities of gait and mobility (R26.89);Muscle weakness (generalized) (M62.81)     Time: 6948-5462 PT Time Calculation (min) (ACUTE ONLY): 26 min  Charges:  $Gait Training: 8-22 mins $Therapeutic Exercise: 8-22 mins                     2:23 PM, 11/18/18 Lonell Grandchild, MPT Physical Therapist with [facility] [phone] office [phone] mobile phone

## 2018-11-18 NOTE — Progress Notes (Signed)
Patient transferred to 304 via wheelchair. She was able to stand. Vitals are stable, and she states her pain is "better". Ostomy and midabdominal clean and intact.

## 2018-11-18 NOTE — Progress Notes (Addendum)
PROGRESS NOTE   [name]  NWG:[id number] DOB: 1941/07/26 DOA: 11/12/2018 PCP: Octavio Graves, DO    Brief Narrative:  78 year old female with a history of HIV, chronic kidney disease stage III, COPD, chronic pain, presents to the hospital with left-sided abdominal pain.  She was found to have perforation of her transverse colon on imaging.  She was admitted to the hospital under general surgery and underwent exploratory laparotomy.  Hospital service was asked to consult on the patient to help with medical management.  Assessment & Plan:   Principal Problem:   Large bowel perforation (HCC) Active Problems:   Human immunodeficiency virus (HIV) disease ([place])   Acute cystitis   HTN (hypertension)   CKD (chronic kidney disease) stage 3, GFR 30-59 ml/min (HCC)   COPD (chronic obstructive pulmonary disease) (HCC)   Hypothyroidism   Obesity (BMI 30-39.9)   Constipation due to opioid therapy   Lactic acidosis   Peritonitis (HCC)   Stercoral ulcer of large intestine  1. Stercoral ulcer with perforation and resultant fecal peritonitis - Surgically Managed.  Pt is status post exploratory laparotomy, descending colon resection and end colostomy by general surgery.  NG removed. LLQ colostomy is putting out bloody stool.  Pt feels better and tolerating clears diet and having bowel sounds.   2. Acute respiratory distress - RESOLVED. Lasix 40 mg IV was given.  Duonebs ordered as needed for wheezing.   3. Afib with RVR - Pt now back in SR, she has been seen by cardiology and will monitor for recurrence to decide about anticoagulation.  Pt now on diltiazem 30 mg BID. Pt to have outpatient cardiology follow up. 4. Chronic kidney disease stage III.  Creatinine improved to 1.18.  5. Septic shock.  RESOLVED.  Patient had been hypotensive with increased lactic acid secondary to peritonitis.  She is off norepinephrine infusion.  Blood cultures have shown no growth.  6. HIV.  Well Controlled.  Continue  antiretrovirals.  Last viral load checked on 10/31/2018 was undetectable. 7. Low phosphorus - additional IV replacement ordered.  Follow phos levels.   8. Hypertension.  Suboptimally controlled.  Pt was resumed on her home nebivolol and now on diltiazem oral 30 mg TID.  Added oral hydralazine 25 mg TID.   9. COPD.  Wheezing much better. Continue bronchodilators as needed. 10. Subclinical Hypothyroidism.  Pt says she doesn't take any thyroid medications.  Free T4 within normal limits.  11. Urinary tract infection.  TREATED.  UC: insignificant growth.    Subjective: Pt reports flatus and blood tinged colostomy output. Pt also reports hearing bowel sounds and feeling better.  Pain controlled. Pt is working with PT.   No palpitations.     Objective: Vitals:   11/18/18 0800 11/18/18 0900 11/18/18 1000 11/18/18 1100  BP:  (!) 156/78    Pulse: 64 66 81 69  Resp: 19 (!) 21 (!) 26 18  Temp: 98.3 F (36.8 C)     TempSrc:      SpO2: 98% 97% 95% 96%  Weight:      Height:        Intake/Output Summary (Last 24 hours) at 11/18/2018 1348 Last data filed at 11/18/2018 0800 Gross per 24 hour  Intake 769.85 ml  Output 1050 ml  Net -280.15 ml   Filed Weights   11/13/18 0615 11/15/18 0500 11/16/18 0500  Weight: 98.1 kg 91.5 kg 91.5 kg   Examination:  General exam: Alert, awake, oriented x 3.  Respiratory system: BBS CTA.  No wheezing heard.  Cardiovascular system: normal s1,s2 sounds. No murmurs, rubs, gallops. Gastrointestinal system: Abdomen is nondistended, soft and tender to palpation.  No organomegaly or masses felt. hypoactive bowel sounds heard.  Colostomy left lower quadrant with dark blood tinged stool seen.  Central nervous system: Alert and oriented. No focal neurological deficits. Extremities: No cyanosis or clubbing, pedal pulses palpated.  Skin: No rashes, lesions or ulcers. Psychiatry: Judgement and insight appear normal. Mood & affect appropriate.   Data Reviewed: I have  personally reviewed following labs and imaging studies  CBC: Recent Labs  Lab 11/13/18 0240 11/14/18 0441 11/15/18 0447 11/16/18 0415 11/17/18 0437 11/18/18 0420  WBC 15.5* 14.0* 10.8* 8.6 8.3 9.6  NEUTROABS 11.3* 10.8* 8.5* 5.7 5.0  --   HGB 12.2 9.2* 8.7* 8.6* 9.7* 9.8*  HCT 36.9 27.7* 26.9* 27.5* 30.7* 30.5*  MCV 103.9* 105.3* 105.9* 107.8* 106.2* 105.2*  PLT 238 166 146* 174 203 629   Basic Metabolic Panel: Recent Labs  Lab 11/14/18 0441 11/15/18 0447 11/16/18 0415 11/17/18 0437 11/18/18 0420  NA 140 142 142 143 140  K 4.1 4.3 3.9 3.9 3.3*  CL 112* 113* 110 107 106  CO2 21* 22 25 26 26   GLUCOSE 114* 97 89 88 113*  BUN 34* 29* 26* 24* 20  CREATININE 1.83* 1.44* 1.46* 1.41* 1.18*  CALCIUM 8.3* 8.8* 8.9 9.3 9.2  MG 2.0 2.0 2.1 2.1 2.0  PHOS 3.0 2.5 2.8 2.2* 2.0*   GFR: Estimated Creatinine Clearance: 43.7 mL/min (A) (by C-G formula based on SCr of 1.18 mg/dL (H)). Liver Function Tests: Recent Labs  Lab 11/12/18 1240 11/18/18 0420  AST 24 20  ALT 20 16  ALKPHOS 64 61  BILITOT 1.3* 0.9  PROT 6.4* 6.0*  ALBUMIN 4.1 2.7*   Recent Labs  Lab 11/12/18 1240  LIPASE 24   No results for input(s): AMMONIA in the last 168 hours. Coagulation Profile: No results for input(s): INR, PROTIME in the last 168 hours. Cardiac Enzymes: No results for input(s): CKTOTAL, CKMB, CKMBINDEX, TROPONINI in the last 168 hours. BNP (last 3 results) No results for input(s): PROBNP in the last 8760 hours. HbA1C: No results for input(s): HGBA1C in the last 72 hours. CBG: No results for input(s): GLUCAP in the last 168 hours. Lipid Profile: No results for input(s): CHOL, HDL, LDLCALC, TRIG, CHOLHDL, LDLDIRECT in the last 72 hours. Thyroid Function Tests: Recent Labs    11/16/18 1058  TSH 4.603*  FREET4 0.91   Anemia Panel: No results for input(s): VITAMINB12, FOLATE, FERRITIN, TIBC, IRON, RETICCTPCT in the last 72 hours. Sepsis Labs: Recent Labs  Lab 11/12/18 1458  11/12/18 2025 11/13/18 0240  LATICACIDVEN 3.7* 5.8* 4.0*    Recent Results (from the past 240 hour(s))  Urine Culture     Status: Abnormal   Collection Time: 11/12/18 12:23 PM  Result Value Ref Range Status   Specimen Description   Final    URINE, CATHETERIZED Performed at [facility], [address] [place], [place] [address]    Special Requests   Final    NONE Performed at [facility], [address], [place], [place] [address]    Culture >=100,000 COLONIES/mL ESCHERICHIA COLI (A)  Final   Report Status 11/15/2018 FINAL  Final   Organism ID, Bacteria ESCHERICHIA COLI (A)  Final      Susceptibility   Escherichia coli - MIC*    AMPICILLIN <=2 SENSITIVE Sensitive     CEFAZOLIN <=4 SENSITIVE Sensitive  CEFTRIAXONE <=1 SENSITIVE Sensitive     CIPROFLOXACIN <=0.25 SENSITIVE Sensitive     GENTAMICIN <=1 SENSITIVE Sensitive     IMIPENEM <=0.25 SENSITIVE Sensitive     NITROFURANTOIN <=16 SENSITIVE Sensitive     TRIMETH/SULFA <=20 SENSITIVE Sensitive     AMPICILLIN/SULBACTAM <=2 SENSITIVE Sensitive     PIP/TAZO <=4 SENSITIVE Sensitive     Extended ESBL NEGATIVE Sensitive     * >=100,000 COLONIES/mL ESCHERICHIA COLI  MRSA PCR Screening     Status: None   Collection Time: 11/12/18  8:00 PM  Result Value Ref Range Status   MRSA by PCR NEGATIVE NEGATIVE Final    Comment:        The GeneXpert MRSA Assay (FDA approved for NASAL specimens only), is one component of a comprehensive MRSA colonization surveillance program. It is not intended to diagnose MRSA infection nor to guide or monitor treatment for MRSA infections. Performed at [facility], [address], [place], [place] [address]   Culture, blood (routine x 2)     Status: None   Collection Time: 11/12/18  9:25 PM  Result Value Ref Range Status   Specimen Description A-LINE DRAWN BY RN  Final   Special Requests   Final    BOTTLES DRAWN AEROBIC AND ANAEROBIC Blood Culture adequate volume   Culture   Final    NO  GROWTH 5 DAYS Performed at [facility], [address], [place], [place] [address]    Report Status 11/17/2018 FINAL  Final  Culture, Urine     Status: Abnormal   Collection Time: 11/12/18  9:30 PM  Result Value Ref Range Status   Specimen Description URINE, CATHETERIZED  Final   Special Requests   Final    NONE Performed at [facility], [address], [place],  [address]    Culture <10,000 COLONIES/mL INSIGNIFICANT GROWTH (A)  Final   Report Status 11/14/2018 FINAL  Final  Culture, blood (routine x 2)     Status: None   Collection Time: 11/12/18  9:37 PM  Result Value Ref Range Status   Specimen Description BLOOD LEFT HAND  Final   Special Requests   Final    BOTTLES DRAWN AEROBIC AND ANAEROBIC Blood Culture adequate volume   Culture   Final    NO GROWTH 5 DAYS Performed at [facility], [address], [place],  [address]    Report Status 11/17/2018 FINAL  Final    Radiology Studies: No results found. Scheduled Meds: . abacavir-dolutegravir-lamiVUDine  1 tablet Oral Daily  . chlorhexidine  15 mL Mouth Rinse BID  . Chlorhexidine Gluconate Cloth  6 each Topical Daily  . diltiazem  30 mg Oral BID  . docusate sodium  100 mg Oral BID  . fluconazole  150 mg Oral Daily  . fluticasone  2 spray Each Nare QHS  . heparin  5,000 Units Subcutaneous Q8H  . hydrALAZINE  25 mg Oral Q8H  . mouth rinse  15 mL Mouth Rinse q12n4p  . nebivolol  10 mg Oral Daily  . pantoprazole (PROTONIX) IV  40 mg Intravenous QHS  . sodium chloride flush  10-40 mL Intracatheter Q12H   Continuous Infusions: . lactated ringers 50 mL/hr at 11/17/18 2300  . sodium phosphate  Dextrose 5% IVPB      LOS: 6 days   Irwin Brakeman, MD [facility]  11/18/2018, 1:48 PM

## 2018-11-18 NOTE — Care Management Important Message (Signed)
Important Message  Patient Details  Name: ELESIA PEMBERTON MRN: [id number] Date of Birth: June 03, 1941   Medicare Important Message Given:  Yes    Shelda Altes 11/18/2018, 12:16 PM

## 2018-11-18 NOTE — Consult Note (Addendum)
[place] Nurse ostomy follow up Stoma type/location: LLQ colostomy Third and final pouch change and teaching for patient and daughter.  HAs been enrolled in secure start with daughters address.  She will be going to her home to stay. We practiced emptying bloody stool that is in pouch this AM.   REviewed infection control standard precautions and need to wear gloves when handling blood and body fluids.  Daughter verbalizes understanding.  Stomal assessment/size: 1 1/2" pink and moist Peristomal assessment: intact  Due to creasing, we are using a barrier ring to promote seal Treatment options for stomal/peristomal skin: barrier ring and 1 piece flat pouch for flexiblity Output bloody effluent in pouch this AM Ostomy pouching: 1pc.flat with barrier ring Education provided: POuch change performed by daughter independently.   Enrolled patient in [place] Start Discharge program: Yes  Supplies at bedside for discharge Will not follow at this time.  Please re-consult if needed.  Domenic Moras MSN, RN, FNP-BC CWON Wound, Ostomy, Continence Nurse Pager [phone]

## 2018-11-19 LAB — MAGNESIUM: Magnesium: 2.1 mg/dL (ref 1.7–2.4)

## 2018-11-19 LAB — CBC
HCT: 33.9 % — ABNORMAL LOW (ref 36.0–46.0)
Hemoglobin: 10.6 g/dL — ABNORMAL LOW (ref 12.0–15.0)
MCH: 33.5 pg (ref 26.0–34.0)
MCHC: 31.3 g/dL (ref 30.0–36.0)
MCV: 107.3 fL — ABNORMAL HIGH (ref 80.0–100.0)
Platelets: 320 10*3/uL (ref 150–400)
RBC: 3.16 MIL/uL — AB (ref 3.87–5.11)
RDW: 12.5 % (ref 11.5–15.5)
WBC: 8 10*3/uL (ref 4.0–10.5)
nRBC: 0 % (ref 0.0–0.2)

## 2018-11-19 LAB — RENAL FUNCTION PANEL
ALBUMIN: 2.9 g/dL — AB (ref 3.5–5.0)
Anion gap: 9 (ref 5–15)
BUN: 13 mg/dL (ref 8–23)
CO2: 26 mmol/L (ref 22–32)
Calcium: 9.5 mg/dL (ref 8.9–10.3)
Chloride: 106 mmol/L (ref 98–111)
Creatinine, Ser: 1.17 mg/dL — ABNORMAL HIGH (ref 0.44–1.00)
GFR calc Af Amer: 52 mL/min — ABNORMAL LOW (ref >60)
GFR calc non Af Amer: 45 mL/min — ABNORMAL LOW (ref >60)
Glucose, Bld: 110 mg/dL — ABNORMAL HIGH (ref 70–99)
PHOSPHORUS: 2.3 mg/dL — AB (ref 2.5–4.6)
Potassium: 3.6 mmol/L (ref 3.5–5.1)
SODIUM: 141 mmol/L (ref 135–145)

## 2018-11-19 MED ORDER — HYDRALAZINE HCL 25 MG PO TABS
50.0000 mg | ORAL_TABLET | Freq: Three times a day (TID) | ORAL | Status: DC
  Administered 2018-11-19 – 2018-11-20 (×3): 50 mg via ORAL
  Filled 2018-11-19 (×3): qty 2

## 2018-11-19 MED ORDER — POTASSIUM PHOSPHATES 15 MMOLE/5ML IV SOLN
10.0000 mmol | Freq: Once | INTRAVENOUS | Status: AC
  Administered 2018-11-19: 10 mmol via INTRAVENOUS
  Filled 2018-11-19: qty 3.33

## 2018-11-19 MED ORDER — POLYETHYLENE GLYCOL 3350 17 G PO PACK
17.0000 g | PACK | Freq: Every day | ORAL | Status: DC
  Administered 2018-11-19 – 2018-11-21 (×3): 17 g via ORAL
  Filled 2018-11-19 (×3): qty 1

## 2018-11-19 NOTE — Progress Notes (Signed)
Pharmacy consulted to replace electrolytes in patient admitted for hypotension, septic shock from fecal peritonitis.  Patient is 5 days postop now. Patient has renal dysfunction.  Goal of therapy: Electrolytes within normal limits:  K 3.5 - 5.1 Corrected Ca 8.9 - 10.3 Phos 2.5 - 4.6 Mg 1.7 - 2.4   Assessment: Lab Results  Component Value Date   CREATININE 1.17 (H) 11/19/2018   BUN 13 11/19/2018   NA 141 11/19/2018   K 3.6 11/19/2018   CL 106 11/19/2018   CO2 26 11/19/2018  Phos 2.3   Plan: Potassium Phosphates 81mmol ivpb x 1 dose today  Recheck electrolytes via CMP tomorrow morning   Donna Christen Joni Colegrove, PharmD, MBA, BCGP Clinical Pharmacist

## 2018-11-19 NOTE — Progress Notes (Signed)
[facility] Progress Note  7 Days Post-Op  Subjective: Up to chair when I arrived. Tolerating some diet but had some nausea after breakfast.   Objective: Vital signs in last 24 hours: Temp:  [99.2 F (37.3 C)] 99.2 F (37.3 C) (01/24 2116) Pulse Rate:  [64-74] 74 (01/25 1400) Resp:  [16-20] 20 (01/25 1400) BP: (160-186)/(75-84) 160/75 (01/25 1519) SpO2:  [95 %-96 %] 96 % (01/25 1400) Last BM Date: 11/11/18  Intake/Output from previous day: 01/24 0701 - 01/25 0700 In: 1444.1 [P.O.:120; I.V.:943.6; IV Piggyback:380.6] Out: 450 [Urine:300; Stool:150] Intake/Output this shift: Total I/O In: 984.6 [P.O.:360; I.V.:618.1; IV Piggyback:6.5] Out: 500 [Urine:500]  General appearance: alert, cooperative and no distress Resp: normal work breathing GI: soft, appropriately tender, nondistened, ostomy pink with stool and more gas, midline with clean dressing  Lab Results:  Recent Labs    11/18/18 0420 11/19/18 0615  WBC 9.6 8.0  HGB 9.8* 10.6*  HCT 30.5* 33.9*  PLT 248 320   BMET Recent Labs    11/18/18 0420 11/19/18 0615  NA 140 141  K 3.3* 3.6  CL 106 106  CO2 26 26  GLUCOSE 113* 110*  BUN 20 13  CREATININE 1.18* 1.17*  CALCIUM 9.2 9.5   Anti-infectives: Anti-infectives (From admission, onward)   Start     Dose/Rate Route Frequency Ordered Stop   11/13/18 1000  abacavir-dolutegravir-lamiVUDine (TRIUMEQ) 600-50-300 MG per tablet 1 tablet     1 tablet Oral Daily 11/12/18 1936     11/13/18 1000  fluconazole (DIFLUCAN) tablet 150 mg  Status:  Discontinued     150 mg Oral Daily 11/12/18 1936 11/18/18 1805   11/12/18 2200  piperacillin-tazobactam (ZOSYN) IVPB 3.375 g     3.375 g 12.5 mL/hr over 240 Minutes Intravenous Every 8 hours 11/12/18 1941 11/16/18 1808   11/12/18 1430  ceFEPIme (MAXIPIME) 2 g in sodium chloride 0.9 % 100 mL IVPB     2 g 200 mL/hr over 30 Minutes Intravenous  Once 11/12/18 1428 11/12/18 1516   11/12/18 1430  metroNIDAZOLE  (FLAGYL) IVPB 500 mg     500 mg 100 mL/hr over 60 Minutes Intravenous  Once 11/12/18 1428 11/12/18 1629      Assessment/Plan: [name] is a 78 yo POD 7 s/p Ex lap, partial colectomy and end colostomy for stercoral ulcer. some nausea this AM.  PRN for pain IS, OOB, needs home PT  Wound care going well with daughter in law, will get home health RN also  Full liquid diet, adv as tolerates  Will add some miralax to the colace HD ok, appreciate Dr. Durenda Age assistance  SCDs, heparin sq    LOS: 7 days    Virl Cagey 11/19/2018

## 2018-11-19 NOTE — Progress Notes (Signed)
PROGRESS NOTE   [name]  DUK:[id number] DOB: [birth year] DOA: 11/12/2018 PCP: Octavio Graves, DO    Brief Narrative:  78 year old female with a history of HIV, chronic kidney disease stage III, COPD, chronic pain, presents to the hospital with left-sided abdominal pain.  She was found to have perforation of her transverse colon on imaging.  She was admitted to the hospital under general surgery and underwent exploratory laparotomy.  Hospital service was asked to consult on the patient to help with medical management.  Assessment & Plan:   Principal Problem:   Large bowel perforation (HCC) Active Problems:   Human immunodeficiency virus (HIV) disease ([place])   Acute cystitis   HTN (hypertension)   CKD (chronic kidney disease) stage 3, GFR 30-59 ml/min (HCC)   COPD (chronic obstructive pulmonary disease) (HCC)   Hypothyroidism   Obesity (BMI 30-39.9)   Constipation due to opioid therapy   Lactic acidosis   Peritonitis (HCC)   Stercoral ulcer of large intestine  1. Stercoral ulcer with perforation and resultant fecal peritonitis - Surgically Managed.  Pt is status post exploratory laparotomy, descending colon resection and end colostomy by general surgery.  NG removed. LLQ colostomy is functioning.  Pt feels better and tolerating clears diet and having bowel sounds.  She is starting to ambulate.  2. Acute respiratory distress - RESOLVED. Lasix 40 mg IV was given.  Duonebs ordered as needed for wheezing.   3. Afib with RVR - Pt now back in SR, she has been seen by cardiology and anticoagulation not recommended at this time.  Pt now on diltiazem 30 mg BID. Pt to have outpatient cardiology follow up. 4. Chronic kidney disease stage III.  Creatinine improved to 1.17.  5. Septic shock.  RESOLVED.  Patient had been hypotensive with increased lactic acid secondary to peritonitis.  She is off norepinephrine infusion.  Blood cultures have shown no growth.  6. HIV.  Well Controlled.  Continue  antiretrovirals.  Last viral load checked on 10/31/2018 was undetectable. 7. Low phosphorus - improving,  additional IV replacement ordered 1/25.   8. Hypertension.  Suboptimally controlled.  Pt was resumed on her home nebivolol and now on diltiazem oral 30 mg TID.  Increasing oral hydralazine 50 mg TID.  Follow.  9. COPD.  Wheezing much better. Continue bronchodilators as needed. 10. Subclinical Hypothyroidism.  Pt says she doesn't take any thyroid medications.  Free T4 within normal limits.  11. Urinary tract infection.  TREATED.  UC: insignificant growth.    Subjective: Pt was a little upset this morning about an unpleasant encounter with night staff nurse tech.   Otherwise she has no complaints.  She will try to get in chair today.    Objective: Vitals:   11/18/18 2033 11/18/18 2116 11/19/18 0522 11/19/18 1008  BP:  (!) 171/84 (!) 186/75 (!) 175/80  Pulse:  64 71   Resp:  18 16   Temp:  99.2 F (37.3 C)    TempSrc:  Oral    SpO2: 95% 95% 95%   Weight:      Height:        Intake/Output Summary (Last 24 hours) at 11/19/2018 1234 Last data filed at 11/19/2018 0925 Gross per 24 hour  Intake 1884.91 ml  Output -  Net 1884.91 ml   Filed Weights   11/13/18 0615 11/15/18 0500 11/16/18 0500  Weight: 98.1 kg 91.5 kg 91.5 kg   Examination:  General exam: Alert, awake, oriented x 3.  Respiratory system: BBS  CTA. No wheezing heard.  Cardiovascular system: normal s1,s2 sounds. No murmurs, rubs, gallops. Gastrointestinal system: Abdomen is nondistended, soft and tender to palpation.  No organomegaly or masses felt. active bowel sounds heard.  Colostomy left lower quadrant with dark stool seen.  Central nervous system: Alert and oriented. No focal neurological deficits. Extremities: No cyanosis or clubbing, pedal pulses palpated.  Skin: No rashes, lesions or ulcers. Psychiatry: Judgement and insight appear normal. Mood & affect appropriate.   Data Reviewed: I have personally reviewed  following labs and imaging studies  CBC: Recent Labs  Lab 11/13/18 0240 11/14/18 0441 11/15/18 0447 11/16/18 0415 11/17/18 0437 11/18/18 0420 11/19/18 0615  WBC 15.5* 14.0* 10.8* 8.6 8.3 9.6 8.0  NEUTROABS 11.3* 10.8* 8.5* 5.7 5.0  --   --   HGB 12.2 9.2* 8.7* 8.6* 9.7* 9.8* 10.6*  HCT 36.9 27.7* 26.9* 27.5* 30.7* 30.5* 33.9*  MCV 103.9* 105.3* 105.9* 107.8* 106.2* 105.2* 107.3*  PLT 238 166 146* 174 203 248 786   Basic Metabolic Panel: Recent Labs  Lab 11/15/18 0447 11/16/18 0415 11/17/18 0437 11/18/18 0420 11/19/18 0615  NA 142 142 143 140 141  K 4.3 3.9 3.9 3.3* 3.6  CL 113* 110 107 106 106  CO2 22 25 26 26 26   GLUCOSE 97 89 88 113* 110*  BUN 29* 26* 24* 20 13  CREATININE 1.44* 1.46* 1.41* 1.18* 1.17*  CALCIUM 8.8* 8.9 9.3 9.2 9.5  MG 2.0 2.1 2.1 2.0 2.1  PHOS 2.5 2.8 2.2* 2.0* 2.3*   GFR: Estimated Creatinine Clearance: 44.1 mL/min (A) (by C-G formula based on SCr of 1.17 mg/dL (H)). Liver Function Tests: Recent Labs  Lab 11/12/18 1240 11/18/18 0420 11/19/18 0615  AST 24 20  --   ALT 20 16  --   ALKPHOS 64 61  --   BILITOT 1.3* 0.9  --   PROT 6.4* 6.0*  --   ALBUMIN 4.1 2.7* 2.9*   Recent Labs  Lab 11/12/18 1240  LIPASE 24   No results for input(s): AMMONIA in the last 168 hours. Coagulation Profile: No results for input(s): INR, PROTIME in the last 168 hours. Cardiac Enzymes: No results for input(s): CKTOTAL, CKMB, CKMBINDEX, TROPONINI in the last 168 hours. BNP (last 3 results) No results for input(s): PROBNP in the last 8760 hours. HbA1C: No results for input(s): HGBA1C in the last 72 hours. CBG: No results for input(s): GLUCAP in the last 168 hours. Lipid Profile: No results for input(s): CHOL, HDL, LDLCALC, TRIG, CHOLHDL, LDLDIRECT in the last 72 hours. Thyroid Function Tests: No results for input(s): TSH, T4TOTAL, FREET4, T3FREE, THYROIDAB in the last 72 hours. Anemia Panel: No results for input(s): VITAMINB12, FOLATE, FERRITIN,  TIBC, IRON, RETICCTPCT in the last 72 hours. Sepsis Labs: Recent Labs  Lab 11/12/18 1458 11/12/18 2025 11/13/18 0240  LATICACIDVEN 3.7* 5.8* 4.0*    Recent Results (from the past 240 hour(s))  Urine Culture     Status: Abnormal   Collection Time: 11/12/18 12:23 PM  Result Value Ref Range Status   Specimen Description   Final    URINE, CATHETERIZED Performed at [facility], [address] [place], [place] [address]    Special Requests   Final    NONE Performed at [facility], [address], [place], [place] [address]    Culture >=100,000 COLONIES/mL ESCHERICHIA COLI (A)  Final   Report Status 11/15/2018 FINAL  Final   Organism ID, Bacteria ESCHERICHIA COLI (A)  Final  Susceptibility   Escherichia coli - MIC*    AMPICILLIN <=2 SENSITIVE Sensitive     CEFAZOLIN <=4 SENSITIVE Sensitive     CEFTRIAXONE <=1 SENSITIVE Sensitive     CIPROFLOXACIN <=0.25 SENSITIVE Sensitive     GENTAMICIN <=1 SENSITIVE Sensitive     IMIPENEM <=0.25 SENSITIVE Sensitive     NITROFURANTOIN <=16 SENSITIVE Sensitive     TRIMETH/SULFA <=20 SENSITIVE Sensitive     AMPICILLIN/SULBACTAM <=2 SENSITIVE Sensitive     PIP/TAZO <=4 SENSITIVE Sensitive     Extended ESBL NEGATIVE Sensitive     * >=100,000 COLONIES/mL ESCHERICHIA COLI  MRSA PCR Screening     Status: None   Collection Time: 11/12/18  8:00 PM  Result Value Ref Range Status   MRSA by PCR NEGATIVE NEGATIVE Final    Comment:        The GeneXpert MRSA Assay (FDA approved for NASAL specimens only), is one component of a comprehensive MRSA colonization surveillance program. It is not intended to diagnose MRSA infection nor to guide or monitor treatment for MRSA infections. Performed at [facility], [address], [place], [place] [address]   Culture, blood (routine x 2)     Status: None   Collection Time: 11/12/18  9:25 PM  Result Value Ref Range Status   Specimen Description A-LINE DRAWN BY RN  Final   Special Requests   Final     BOTTLES DRAWN AEROBIC AND ANAEROBIC Blood Culture adequate volume   Culture   Final    NO GROWTH 5 DAYS Performed at [facility], [address], [place], [place] [address]    Report Status 11/17/2018 FINAL  Final  Culture, Urine     Status: Abnormal   Collection Time: 11/12/18  9:30 PM  Result Value Ref Range Status   Specimen Description URINE, CATHETERIZED  Final   Special Requests   Final    NONE Performed at [facility] [address] [place], [place] [address]    Culture <10,000 COLONIES/mL INSIGNIFICANT GROWTH (A)  Final   Report Status 11/14/2018 FINAL  Final  Culture, blood (routine x 2)     Status: None   Collection Time: 11/12/18  9:37 PM  Result Value Ref Range Status   Specimen Description BLOOD LEFT HAND  Final   Special Requests   Final    BOTTLES DRAWN AEROBIC AND ANAEROBIC Blood Culture adequate volume   Culture   Final    NO GROWTH 5 DAYS Performed at [facility], [address], [place],  [address]    Report Status 11/17/2018 FINAL  Final    Radiology Studies: No results found. Scheduled Meds: . abacavir-dolutegravir-lamiVUDine  1 tablet Oral Daily  . chlorhexidine  15 mL Mouth Rinse BID  . Chlorhexidine Gluconate Cloth  6 each Topical Daily  . diltiazem  30 mg Oral BID  . docusate sodium  100 mg Oral BID  . fluticasone  2 spray Each Nare QHS  . heparin  5,000 Units Subcutaneous Q8H  . hydrALAZINE  25 mg Oral Q8H  . mouth rinse  15 mL Mouth Rinse q12n4p  . nebivolol  10 mg Oral Daily  . pantoprazole (PROTONIX) IV  40 mg Intravenous QHS  . sodium chloride flush  10-40 mL Intracatheter Q12H   Continuous Infusions: . lactated ringers 50 mL/hr at 11/19/18 0925  . potassium PHOSPHATE IVPB (in mmol)      LOS: 7 days   Irwin Brakeman, MD [facility]  11/19/2018, 12:34 PM

## 2018-11-20 LAB — BASIC METABOLIC PANEL
Anion gap: 10 (ref 5–15)
BUN: 8 mg/dL (ref 8–23)
CO2: 21 mmol/L — ABNORMAL LOW (ref 22–32)
CREATININE: 1.13 mg/dL — AB (ref 0.44–1.00)
Calcium: 9.2 mg/dL (ref 8.9–10.3)
Chloride: 108 mmol/L (ref 98–111)
GFR calc Af Amer: 54 mL/min — ABNORMAL LOW (ref >60)
GFR calc non Af Amer: 47 mL/min — ABNORMAL LOW (ref >60)
Glucose, Bld: 114 mg/dL — ABNORMAL HIGH (ref 70–99)
Potassium: 4.6 mmol/L (ref 3.5–5.1)
Sodium: 139 mmol/L (ref 135–145)

## 2018-11-20 LAB — PHOSPHORUS: Phosphorus: 2.9 mg/dL (ref 2.5–4.6)

## 2018-11-20 MED ORDER — ENOXAPARIN SODIUM 40 MG/0.4ML ~~LOC~~ SOLN
40.0000 mg | SUBCUTANEOUS | Status: DC
  Administered 2018-11-20 – 2018-11-21 (×2): 40 mg via SUBCUTANEOUS
  Filled 2018-11-20 (×2): qty 0.4

## 2018-11-20 MED ORDER — ENSURE ENLIVE PO LIQD
237.0000 mL | Freq: Two times a day (BID) | ORAL | Status: DC
  Administered 2018-11-20 – 2018-11-21 (×2): 237 mL via ORAL

## 2018-11-20 MED ORDER — HYDRALAZINE HCL 25 MG PO TABS
75.0000 mg | ORAL_TABLET | Freq: Three times a day (TID) | ORAL | Status: DC
  Administered 2018-11-20 – 2018-11-21 (×3): 75 mg via ORAL
  Filled 2018-11-20 (×3): qty 3

## 2018-11-20 NOTE — Progress Notes (Signed)
PROGRESS NOTE   [name]  GNF:[id number] DOB: January 18, 1941 DOA: 11/12/2018 PCP: Octavio Graves, DO    Brief Narrative:  78 year old female with a history of HIV, chronic kidney disease stage III, COPD, chronic pain, presents to the hospital with left-sided abdominal pain.  She was found to have perforation of her transverse colon on imaging.  She was admitted to the hospital under general surgery and underwent exploratory laparotomy.  Hospital service was asked to consult on the patient to help with medical management.  Assessment & Plan:   Principal Problem:   Large bowel perforation (HCC) Active Problems:   Human immunodeficiency virus (HIV) disease ([place])   Acute cystitis   HTN (hypertension)   CKD (chronic kidney disease) stage 3, GFR 30-59 ml/min (HCC)   COPD (chronic obstructive pulmonary disease) (HCC)   Hypothyroidism   Obesity (BMI 30-39.9)   Constipation due to opioid therapy   Lactic acidosis   Peritonitis (HCC)   Stercoral ulcer of large intestine  1. Stercoral ulcer with perforation and resultant fecal peritonitis - Surgically Managed.  Pt is status post exploratory laparotomy, descending colon resection and end colostomy by general surgery.  NG removed. LLQ colostomy is functioning.  Pt feels better and tolerating clears diet and having bowel sounds.  She is starting to ambulate.  2. Acute respiratory distress - RESOLVED. Lasix 40 mg IV was given.  Duonebs ordered as needed for wheezing.  Now breathing comfortably on room air 3. Afib with RVR - Pt now back in SR, she has been seen by cardiology and anticoagulation not recommended at this time.  Pt now on diltiazem 30 mg BID. Pt to have outpatient cardiology follow up. 4. Chronic kidney disease stage III.  Creatinine improved to 1.17.  5. Septic shock.  RESOLVED.  Patient had been hypotensive with increased lactic acid secondary to peritonitis.  She is off norepinephrine infusion.  Blood cultures have shown no growth.    6. HIV.  Well Controlled.  Continue antiretrovirals.  Last viral load checked on 10/31/2018 was undetectable. 7. Low phosphorus - improving,  additional IV replacement ordered 1/25.   8. Hypertension.  Suboptimally controlled.  Pt was resumed on her home nebivolol and now on diltiazem oral 30 mg TID.  Increasing oral hydralazine 75 mg TID.  Follow.  9. COPD.  Wheezing much better. Continue bronchodilators as needed. 10. Subclinical Hypothyroidism.  Pt says she doesn't take any thyroid medications.  Free T4 within normal limits.  11. Urinary tract infection.  TREATED.  UC: Showed E. coli   Subjective: Had some nausea earlier this morning, no vomiting.  Objective: Vitals:   11/19/18 2136 11/20/18 0525 11/20/18 1043 11/20/18 1401  BP: (!) 188/77 (!) 177/83 (!) 160/75 (!) 156/68  Pulse:  74  70  Resp: 18 18  18   Temp: 99 F (37.2 C) 98.2 F (36.8 C)  98.9 F (37.2 C)  TempSrc: Oral Oral  Oral  SpO2: 98% 98%  94%  Weight:      Height:        Intake/Output Summary (Last 24 hours) at 11/20/2018 1845 Last data filed at 11/20/2018 1320 Gross per 24 hour  Intake 1272.9 ml  Output 1800 ml  Net -527.1 ml   Filed Weights   11/13/18 0615 11/15/18 0500 11/16/18 0500  Weight: 98.1 kg 91.5 kg 91.5 kg   Examination:  General exam: Alert, awake, oriented x 3 Respiratory system: Clear to auscultation. Respiratory effort normal. Cardiovascular system:RRR. No murmurs, rubs, gallops. Gastrointestinal system:  Abdomen is nondistended, soft and nontender. No organomegaly or masses felt. Normal bowel sounds heard.  Colostomy in left lower quadrant.  Midline abdominal wound with dressing, not removed Central nervous system: Alert and oriented. No focal neurological deficits. Extremities: No C/C/E, +pedal pulses Skin: No rashes, lesions or ulcers Psychiatry: Judgement and insight appear normal. Mood & affect appropriate.   Data Reviewed: I have personally reviewed following labs and imaging  studies  CBC: Recent Labs  Lab 11/14/18 0441 11/15/18 0447 11/16/18 0415 11/17/18 0437 11/18/18 0420 11/19/18 0615  WBC 14.0* 10.8* 8.6 8.3 9.6 8.0  NEUTROABS 10.8* 8.5* 5.7 5.0  --   --   HGB 9.2* 8.7* 8.6* 9.7* 9.8* 10.6*  HCT 27.7* 26.9* 27.5* 30.7* 30.5* 33.9*  MCV 105.3* 105.9* 107.8* 106.2* 105.2* 107.3*  PLT 166 146* 174 203 248 161   Basic Metabolic Panel: Recent Labs  Lab 11/15/18 0447 11/16/18 0415 11/17/18 0437 11/18/18 0420 11/19/18 0615 11/20/18 0834  NA 142 142 143 140 141 139  K 4.3 3.9 3.9 3.3* 3.6 4.6  CL 113* 110 107 106 106 108  CO2 22 25 26 26 26  21*  GLUCOSE 97 89 88 113* 110* 114*  BUN 29* 26* 24* 20 13 8   CREATININE 1.44* 1.46* 1.41* 1.18* 1.17* 1.13*  CALCIUM 8.8* 8.9 9.3 9.2 9.5 9.2  MG 2.0 2.1 2.1 2.0 2.1  --   PHOS 2.5 2.8 2.2* 2.0* 2.3* 2.9   GFR: Estimated Creatinine Clearance: 45.7 mL/min (A) (by C-G formula based on SCr of 1.13 mg/dL (H)). Liver Function Tests: Recent Labs  Lab 11/18/18 0420 11/19/18 0615  AST 20  --   ALT 16  --   ALKPHOS 61  --   BILITOT 0.9  --   PROT 6.0*  --   ALBUMIN 2.7* 2.9*   No results for input(s): LIPASE, AMYLASE in the last 168 hours. No results for input(s): AMMONIA in the last 168 hours. Coagulation Profile: No results for input(s): INR, PROTIME in the last 168 hours. Cardiac Enzymes: No results for input(s): CKTOTAL, CKMB, CKMBINDEX, TROPONINI in the last 168 hours. BNP (last 3 results) No results for input(s): PROBNP in the last 8760 hours. HbA1C: No results for input(s): HGBA1C in the last 72 hours. CBG: No results for input(s): GLUCAP in the last 168 hours. Lipid Profile: No results for input(s): CHOL, HDL, LDLCALC, TRIG, CHOLHDL, LDLDIRECT in the last 72 hours. Thyroid Function Tests: No results for input(s): TSH, T4TOTAL, FREET4, T3FREE, THYROIDAB in the last 72 hours. Anemia Panel: No results for input(s): VITAMINB12, FOLATE, FERRITIN, TIBC, IRON, RETICCTPCT in the last 72  hours. Sepsis Labs: No results for input(s): PROCALCITON, LATICACIDVEN in the last 168 hours.  Recent Results (from the past 240 hour(s))  Urine Culture     Status: Abnormal   Collection Time: 11/12/18 12:23 PM  Result Value Ref Range Status   Specimen Description   Final    URINE, CATHETERIZED Performed at [facility], [address] [place], [place] [address]    Special Requests   Final    NONE Performed at [facility], [address], [place],  [address]    Culture >=100,000 COLONIES/mL ESCHERICHIA COLI (A)  Final   Report Status 11/15/2018 FINAL  Final   Organism ID, Bacteria ESCHERICHIA COLI (A)  Final      Susceptibility   Escherichia coli - MIC*    AMPICILLIN <=2 SENSITIVE Sensitive     CEFAZOLIN <=4 SENSITIVE Sensitive     CEFTRIAXONE <=  1 SENSITIVE Sensitive     CIPROFLOXACIN <=0.25 SENSITIVE Sensitive     GENTAMICIN <=1 SENSITIVE Sensitive     IMIPENEM <=0.25 SENSITIVE Sensitive     NITROFURANTOIN <=16 SENSITIVE Sensitive     TRIMETH/SULFA <=20 SENSITIVE Sensitive     AMPICILLIN/SULBACTAM <=2 SENSITIVE Sensitive     PIP/TAZO <=4 SENSITIVE Sensitive     Extended ESBL NEGATIVE Sensitive     * >=100,000 COLONIES/mL ESCHERICHIA COLI  MRSA PCR Screening     Status: None   Collection Time: 11/12/18  8:00 PM  Result Value Ref Range Status   MRSA by PCR NEGATIVE NEGATIVE Final    Comment:        The GeneXpert MRSA Assay (FDA approved for NASAL specimens only), is one component of a comprehensive MRSA colonization surveillance program. It is not intended to diagnose MRSA infection nor to guide or monitor treatment for MRSA infections. Performed at [facility], [address] [place], [place] [address]   Culture, blood (routine x 2)     Status: None   Collection Time: 11/12/18  9:25 PM  Result Value Ref Range Status   Specimen Description A-LINE DRAWN BY RN  Final   Special Requests   Final    BOTTLES DRAWN AEROBIC AND ANAEROBIC Blood Culture adequate  volume   Culture   Final    NO GROWTH 5 DAYS Performed at [facility], [address] [place], [place] [address]    Report Status 11/17/2018 FINAL  Final  Culture, Urine     Status: Abnormal   Collection Time: 11/12/18  9:30 PM  Result Value Ref Range Status   Specimen Description URINE, CATHETERIZED  Final   Special Requests   Final    NONE Performed at [facility], [address] [place], [place] [address]    Culture <10,000 COLONIES/mL INSIGNIFICANT GROWTH (A)  Final   Report Status 11/14/2018 FINAL  Final  Culture, blood (routine x 2)     Status: None   Collection Time: 11/12/18  9:37 PM  Result Value Ref Range Status   Specimen Description BLOOD LEFT HAND  Final   Special Requests   Final    BOTTLES DRAWN AEROBIC AND ANAEROBIC Blood Culture adequate volume   Culture   Final    NO GROWTH 5 DAYS Performed at [facility], [address], [place], [place] [address]    Report Status 11/17/2018 FINAL  Final    Radiology Studies: No results found. Scheduled Meds: . abacavir-dolutegravir-lamiVUDine  1 tablet Oral Daily  . chlorhexidine  15 mL Mouth Rinse BID  . diltiazem  30 mg Oral BID  . docusate sodium  100 mg Oral BID  . enoxaparin (LOVENOX) injection  40 mg Subcutaneous Q24H  . feeding supplement (ENSURE ENLIVE)  237 mL Oral BID BM  . fluticasone  2 spray Each Nare QHS  . hydrALAZINE  75 mg Oral Q8H  . mouth rinse  15 mL Mouth Rinse q12n4p  . nebivolol  10 mg Oral Daily  . pantoprazole (PROTONIX) IV  40 mg Intravenous QHS  . polyethylene glycol  17 g Oral Daily   Continuous Infusions: . lactated ringers 50 mL/hr at 11/20/18 1320    LOS: 8 days   Kathie Dike, MD [facility]  11/20/2018, 6:45 PM

## 2018-11-20 NOTE — Progress Notes (Signed)
[facility] Progress Note  8 Days Post-Op  Subjective: Ate breakfast but some nausea. Some output in bag but none recorded.   Objective: Vital signs in last 24 hours: Temp:  [98.2 F (36.8 C)-99 F (37.2 C)] 98.2 F (36.8 C) (01/26 0525) Pulse Rate:  [74] 74 (01/26 0525) Resp:  [18-20] 18 (01/26 0525) BP: (160-188)/(75-83) 160/75 (01/26 1043) SpO2:  [96 %-98 %] 98 % (01/26 0525) Last BM Date: 11/11/18  Intake/Output from previous day: 01/25 0701 - 01/26 0700 In: 1234.6 [P.O.:600; I.V.:628.1; IV Piggyback:6.5] Out: 2000 [Urine:2000] Intake/Output this shift: Total I/O In: 240 [P.O.:240] Out: -   General appearance: alert, cooperative and no distress Resp: normal work breathing GI: soft, nondistended, wounds clean, ostomy edematous with stool in bag  Lab Results:  Recent Labs    11/18/18 0420 11/19/18 0615  WBC 9.6 8.0  HGB 9.8* 10.6*  HCT 30.5* 33.9*  PLT 248 320   BMET Recent Labs    11/19/18 0615 11/20/18 0834  NA 141 139  K 3.6 4.6  CL 106 108  CO2 26 21*  GLUCOSE 110* 114*  BUN 13 8  CREATININE 1.17* 1.13*  CALCIUM 9.5 9.2   Anti-infectives: Anti-infectives (From admission, onward)   Start     Dose/Rate Route Frequency Ordered Stop   11/13/18 1000  abacavir-dolutegravir-lamiVUDine (TRIUMEQ) 600-50-300 MG per tablet 1 tablet     1 tablet Oral Daily 11/12/18 1936     11/13/18 1000  fluconazole (DIFLUCAN) tablet 150 mg  Status:  Discontinued     150 mg Oral Daily 11/12/18 1936 11/18/18 1805   11/12/18 2200  piperacillin-tazobactam (ZOSYN) IVPB 3.375 g     3.375 g 12.5 mL/hr over 240 Minutes Intravenous Every 8 hours 11/12/18 1941 11/16/18 1808   11/12/18 1430  ceFEPIme (MAXIPIME) 2 g in sodium chloride 0.9 % 100 mL IVPB     2 g 200 mL/hr over 30 Minutes Intravenous  Once 11/12/18 1428 11/12/18 1516   11/12/18 1430  metroNIDAZOLE (FLAGYL) IVPB 500 mg     500 mg 100 mL/hr over 60 Minutes Intravenous  Once 11/12/18 1428 11/12/18  1629      Assessment/Plan: [name] is a 78 yo POD 8 s/p Ex lap, partial colectomy and end colostomy for stercoral ulcer.  PRN for pain IS, OOB, needs home PT  Full liquid diet, some nausea  Miralax and colace HD ok SCDs, heparin sq wants to change to stop getting the shots, discussed changing to lovenox for once a day dosing, Cr 1.15, but no major renal issues, will change    LOS: 8 days    Virl Cagey 11/20/2018

## 2018-11-21 LAB — CBC WITH DIFFERENTIAL/PLATELET
Abs Immature Granulocytes: 0.45 10*3/uL — ABNORMAL HIGH (ref 0.00–0.07)
Basophils Absolute: 0.1 10*3/uL (ref 0.0–0.1)
Basophils Relative: 1 %
Eosinophils Absolute: 0.1 10*3/uL (ref 0.0–0.5)
Eosinophils Relative: 1 %
HEMATOCRIT: 33.4 % — AB (ref 36.0–46.0)
Hemoglobin: 10.4 g/dL — ABNORMAL LOW (ref 12.0–15.0)
Immature Granulocytes: 6 %
LYMPHS ABS: 1.8 10*3/uL (ref 0.7–4.0)
Lymphocytes Relative: 23 %
MCH: 33.8 pg (ref 26.0–34.0)
MCHC: 31.1 g/dL (ref 30.0–36.0)
MCV: 108.4 fL — AB (ref 80.0–100.0)
MONOS PCT: 9 %
Monocytes Absolute: 0.8 10*3/uL (ref 0.1–1.0)
Neutro Abs: 4.8 10*3/uL (ref 1.7–7.7)
Neutrophils Relative %: 60 %
Platelets: 406 10*3/uL — ABNORMAL HIGH (ref 150–400)
RBC: 3.08 MIL/uL — ABNORMAL LOW (ref 3.87–5.11)
RDW: 13.2 % (ref 11.5–15.5)
WBC: 7.9 10*3/uL (ref 4.0–10.5)
nRBC: 0.4 % — ABNORMAL HIGH (ref 0.0–0.2)

## 2018-11-21 MED ORDER — TRAMADOL HCL 50 MG PO TABS: 50.0000 mg | ORAL_TABLET | Freq: Four times a day (QID) | ORAL | 0 refills | Status: DC | PRN

## 2018-11-21 MED ORDER — DILTIAZEM HCL 30 MG PO TABS: 30.0000 mg | ORAL_TABLET | Freq: Two times a day (BID) | ORAL | 0 refills | Status: DC

## 2018-11-21 MED ORDER — HYDRALAZINE HCL 25 MG PO TABS
100.0000 mg | ORAL_TABLET | Freq: Three times a day (TID) | ORAL | Status: DC
  Administered 2018-11-21: 100 mg via ORAL
  Filled 2018-11-21: qty 4

## 2018-11-21 MED ORDER — DOCUSATE SODIUM 100 MG PO CAPS: 100.0000 mg | ORAL_CAPSULE | Freq: Two times a day (BID) | ORAL | 0 refills | Status: AC

## 2018-11-21 MED ORDER — HYDRALAZINE HCL 100 MG PO TABS: 100.0000 mg | ORAL_TABLET | Freq: Three times a day (TID) | ORAL | 0 refills | Status: DC

## 2018-11-21 MED ORDER — ONDANSETRON 4 MG PO TBDP: 4.0000 mg | ORAL_TABLET | Freq: Four times a day (QID) | ORAL | 0 refills | Status: DC | PRN

## 2018-11-21 MED ORDER — POLYETHYLENE GLYCOL 3350 17 G PO PACK: 17.0000 g | PACK | Freq: Every day | ORAL | 0 refills | Status: DC

## 2018-11-21 NOTE — Progress Notes (Signed)
PT Cancellation Note  Patient Details Name: [name] MRN: [id number] DOB: 1941/09/20   Cancelled Treatment:    Reason Eval/Treat Not Completed: Patient declined, no reason specified. Patient refused physical therapy at 9:11am secondary to not feeling well and being cold. Therapist provided blanket and educated patient on importance of therapy to improve functional status and return home. Will attempt to check on patient later today if time allows.   11:06 AM, 11/21/18 Talbot Grumbling, DPT Physical Therapist with [facility] [phone] office

## 2018-11-21 NOTE — Progress Notes (Signed)
Physical Therapy Treatment Patient Details Name: [name] MRN: [id number] DOB: [birth year] Today's Date: 11/21/2018    History of Present Illness Patient is a 78 year old female admitted 11/12/2018 with diagnosis of large bowel perforation. S/P exploratory laproscopy 11/12/2018. PMH: HIV, HTN, CKD, COPD, obesity, chronic pain    PT Comments    Patient presents supine in bed with head of bed elevated and 2 family members at bedside encouraging patient to perform in physical therapy to improve functional status, so patient agreeable. Patient denies pain or not feeling well upon arrival. Patient performs supine to sit transfer requiring increased time, use of bed rail, and min assist for trunk uprighting. Patient performs sit to/from and stand pivot transfers using RW at min assist. Patient initially requires bed elevated for transfers, but improves to transfer at normal height. Patient initially dizzy with positional changes, but improves with time and educate patient to use breaks with movement to avoid loss of balance once home. Patient ambulates 35 feet with RW and min assist demonstrating shortened, equal bil step length, decreased cadence, decreased heel-toe pattern, and labored step progression. Patient limited by nausea complaints with ambulation. Patient left up in chair with bil lower extremities elevated and family members present at bedside. Doctor in room during treatment to check surgical site and discuss possible discharge home today. Patient will benefit from continued physical therapy in hospital and recommended venue below to increase strength, balance, endurance for safe ADLs and gait.    Follow Up Recommendations  Home health PT;Supervision for mobility/OOB     Equipment Recommendations  None recommended by PT    Recommendations for Other Services       Precautions / Restrictions Precautions Precautions: Fall Restrictions Weight Bearing Restrictions: No     Mobility  Bed Mobility Overal bed mobility: Needs Assistance Bed Mobility: Supine to Sit     Supine to sit: Min assist     General bed mobility comments: increased time, labored movement, use of bed rail  Transfers Overall transfer level: Needs assistance Equipment used: Rolling walker (2 wheeled) Transfers: Sit to/from [facility] Sit to Stand: Min assist Stand pivot transfers: Min assist       General transfer comment: increased time, verbal cues for hand placement, dizziness upon standing resolves with time  Ambulation/Gait Ambulation/Gait assistance: Min assist;Min guard Gait Distance (Feet): 35 Feet Assistive device: Rolling walker (2 wheeled) Gait Pattern/deviations: Decreased step length - right;Decreased step length - left;Decreased stride length Gait velocity: decreased   General Gait Details: unable to fully pass opposite foot with step length, slow labored cadence, limited by nausea complaints   Stairs             Wheelchair Mobility    Modified Rankin (Stroke Patients Only)       Balance Overall balance assessment: Needs assistance Sitting-balance support: Feet supported;No upper extremity supported Sitting balance-Leahy Scale: Good     Standing balance support: Bilateral upper extremity supported;During functional activity Standing balance-Leahy Scale: Fair Standing balance comment: using RW                            Cognition Arousal/Alertness: Awake/alert Behavior During Therapy: WFL for tasks assessed/performed Overall Cognitive Status: Within Functional Limits for tasks assessed  Exercises      General Comments        Pertinent Vitals/Pain Pain Assessment: No/denies pain    Home Living                      Prior Function            PT Goals (current goals can now be found in the care plan section) Acute Rehab PT  Goals Patient Stated Goal: go live with son and daughter-in-law at discharge. PT Goal Formulation: With patient Time For Goal Achievement: 12/01/18 Potential to Achieve Goals: Good Progress towards PT goals: Progressing toward goals    Frequency    Min 3X/week      PT Plan Current plan remains appropriate    Co-evaluation              AM-PAC PT "6 Clicks" Mobility   Outcome Measure  Help needed turning from your back to your side while in a flat bed without using bedrails?: A Little Help needed moving from lying on your back to sitting on the side of a flat bed without using bedrails?: A Little Help needed moving to and from a bed to a chair (including a wheelchair)?: A Little Help needed standing up from a chair using your arms (e.g., wheelchair or bedside chair)?: A Little Help needed to walk in hospital room?: A Little Help needed climbing 3-5 steps with a railing? : A Lot 6 Click Score: 17    End of Session Equipment Utilized During Treatment: Gait belt Activity Tolerance: Patient tolerated treatment well;Patient limited by fatigue Patient left: in chair;with call bell/phone within reach;with family/visitor present(2 family members) Nurse Communication: Mobility status PT Visit Diagnosis: Unsteadiness on feet (R26.81);History of falling (Z91.81);Other abnormalities of gait and mobility (R26.89);Muscle weakness (generalized) (M62.81)     Time: 1116-1140 PT Time Calculation (min) (ACUTE ONLY): 24 min  Charges:  $Gait Training: 8-22 mins $Therapeutic Activity: 8-22 mins                     12:04 PM, 11/21/18 Talbot Grumbling, DPT Physical Therapist with [facility] [phone] office

## 2018-11-21 NOTE — Discharge Instructions (Signed)
Discharge Open Abdominal Surgery Instructions:  Common Complaints: Pain at the incision site is common. This will improve with time. Take your pain medications as described below. Some nausea is common and poor appetite. The main goal is to stay hydrated the first few days after surgery.   Diet/ Activity: Diet as tolerated. You have started and tolerated a diet in the hospital, and should continue to increase what you are able to eat.   You may not have a large appetite, but it is important to stay hydrated. Drink 64 ounces of water a day. Your appetite will return with time.  Keep your wound packed daily with damp to dry gauze and place a pad and paper tape over the wound. Keep your ostomy changed as per the instructions from the ostomy RN.  You will have home health RN to come and help with wound and ostomy care. You will have a physical therapist to help with therapy and getting stronger.  You have been started on some new blood pressure and heart rate medications, take these as prescribed. You will be seeing cardiology as an outpatient and they will decide if these medications need to be stopped.  You may Shower but keep your ostomy bag in place and plan to replace it after the shower. The wound dressing should remain in place and then make sure the wound is dry and replace the packing after the shower.  Do not take baths or submerge. Wear an abdominal binder daily with activity. You do not have to wear this while sleeping or sitting.  Rest and listen to your body, but do not remain in bed all day.  Walk everyday for at least 15-20 minutes. Deep cough and move around every 1-2 hours in the first few days after surgery.  Do not lift > 10 lbs, perform excessive bending, pushing, pulling, squatting for 6-8 weeks after surgery.  The activity restrictions and the abdominal binder are to prevent hernia formation at your incision while you are healing.  Do not place lotions or balms on your  incision unless instructed to specifically by Dr. Constance Haw.   Medication: Take tylenol and ibuprofen as needed for pain control, alternating every 4-6 hours.  Example:  Tylenol 1000mg  @ 6am, 12noon, 6pm, 16midnight (Do not exceed 4000mg  of tylenol a day). Ibuprofen 800mg  @ 9am, 3pm, 9pm, 3am (Do not exceed 3600mg  of ibuprofen a day).  Take Tramadol for breakthrough pain as needed.  Take Colace for constipation related to narcotic pain medication. If you do not have a bowel movement in 2 days, take Miralax over the counter.  Drink plenty of water to also prevent constipation.   Contact Information: If you have questions or concerns, please call our office, [phone], Monday- Thursday 8AM-5PM and Friday 8AM-12Noon.  If it is after hours or on the weekend, please call [facility] Main Number, [phone], and ask to speak to the surgeon on call for Dr. Constance Haw at [facility].    Colostomy Surgery, Adult, Care After  This sheet gives you information about how to care for yourself after your procedure. Your health care provider may also give you more specific instructions. If you have problems or questions, contact your health care provider. What can I expect after the procedure? After the procedure, it is common to have:  Swelling at the opening that was created during the procedure (stoma).  Slight bleeding around the stoma.  Redness around the stoma. Follow these instructions at home: Activity  Rest as needed  while the stoma area heals.  Return to your normal activities as told by your health care provider. Ask your health care provider what activities are safe for you.  Avoid strenuous activity and abdominal exercises for 3 weeks or for as long as told by your health care provider.  Do not lift anything that is heavier than 10 lb (4.5 kg), or the limit that you are told, until your health care provider says that it is safe. Incision care  Follow instructions from your health care  provider about how to take care of your incision. Make sure you: ? Wash your hands with soap and water before you change your bandage (dressing). If soap and water are not available, use hand sanitizer. ? Change your dressing as told by your health care provider. ? Leave stitches (sutures), skin glue, or adhesive strips in place. These skin closures may need to stay in place for 2 weeks or longer. If adhesive strip edges start to loosen and curl up, you may trim the loose edges. Do not remove adhesive strips completely unless your health care provider tells you to do that. Stoma care  Keep the stoma area clean.  Clean and dry the skin around the stoma each time you change the colostomy bag. To clean the stoma area: ? Use warm water and only use cleansers that are recommended by your health care provider. ? Rinse the stoma area with plain water. ? Dry the area well.  Use stoma powder or skin barrier film on your skin only as told by your health care provider. Do not use any other powders, gels, wipes, or creams on the skin around the stoma.  Check the stoma area every day for signs of infection. Check for: ? More redness, swelling, or pain. ? More fluid or blood. ? Pus or warmth.  Measure the stoma opening regularly and record the size. Watch for changes. Share this information with your health care provider. Bathing  Do not take baths, swim, or use a hot tub until your health care provider approves.   You may shower as per the above instructions.   Avoid using harsh or oily soaps when you bathe. Colostomy bag care  Follow instructions from your health care provider about how to empty or change the colostomy bag.  Keep colostomy supplies with you at all times.  Store all supplies in a cool, dry place.  Empty the colostomy bag: ? Whenever it is one-third to one-half full. ? At bedtime.  Replace the bag every 3-4 days for the first 6 weeks, then every 4-7 days. Driving  Follow  driving restrictions as told by your health care provider.  Do not drive or use heavy machinery while taking prescription pain medicine. General instructions  Follow instructions from your health care provider about eating or drinking restrictions.  Take over-the-counter and prescription medicines only as told by your health care provider.  Avoid wearing clothes that are tight directly over your stoma area.  Do not use any products that contain nicotine or tobacco, such as cigarettes and e-cigarettes. If you need help quitting, ask your health care provider.  If you are a woman, ask your health care provider about becoming pregnant and about using birth control. Medicines may not be absorbed normally after the procedure.  Keep all follow-up visits as told by your health care provider. This is important. Contact a health care provider if you have:  Trouble caring for your stoma or changing the colostomy bag.  Nausea or vomiting.  A fever.  More redness, swelling, or pain at the site of your stoma or around your anus.  More fluid or blood coming from your stoma or your anus.  Warmth around your stoma area.  Pus coming from your stoma.  A change in the size or appearance of the stoma.  Abdominal pain, bloating, pressure, or cramping.  Stool more often or less often than your health care provider tells you to expect.  Very little urine production. This may be a sign of dehydration. Get help right away if you have:  Abdominal pain that does not go away or becomes severe.  Frequent vomiting.  No stool draining through the stoma.  Chest pain or an irregular heartbeat. Summary  Follow instructions from your health care provider about how to take care of your incision and stoma.  Contact a health care provider if you have trouble caring for your stoma or changing the colostomy bag.  Get help right away if you have abdominal pain that does not go away or becomes severe or  if you have no stool draining through the stoma.  Keep all follow-up visits as told by your health care provider. This is important. This information is not intended to replace advice given to you by your health care provider. Make sure you discuss any questions you have with your health care provider. Document Released: 03/04/2011 Document Revised: 02/08/2018 Document Reviewed: 02/08/2018 Elsevier Interactive Patient Education  2019 [place], Adult  Colostomy surgery is done to create an opening in the front of the abdomen for stool (feces) to leave the body through an ostomy (stoma). Part of the large intestine is attached to the stoma. A bag, also called a pouch, is fitted over the stoma. Stool and gas will collect in the bag. After surgery, you will need to empty and change your colostomy bag as needed. You will also need to care for your stoma. How to care for the stoma Your stoma should look pink, red, and moist, like the inside of your cheek. Soon after surgery, the stoma may be swollen, but this swelling will go away within 6 weeks. To care for the stoma:  Keep the skin around the stoma clean and dry.  Use a clean, soft washcloth to gently wash the stoma and the skin around it. Clean using a circular motion, and wipe away from the stoma opening, not toward it. ? Use warm water and only use cleansers recommended by your health care provider. ? Rinse the stoma area with plain water. ? Dry the area around the stoma well.  Use stoma powder or ointment on your skin only as told by your health care provider. Do not use any other powders, gels, wipes, or creams on the skin around the stoma.  Check the stoma area every day for signs of infection. Check for: ? New or worsening redness, swelling, or pain. ? New or increased fluid or blood. ? Pus or warmth.  Measure the stoma opening regularly and record the size. Watch for changes. (It is normal for the stoma  to get smaller as swelling goes away.) Share this information with your health care provider. How to empty the colostomy bag  Empty your bag at bedtime and whenever it is one-third to one-half full. Do not let the bag get more than half-full with stool or gas. The bag could leak if it gets too full. Some colostomy bags have a built-in  gas release valve that releases gas often throughout the day. Follow these basic steps: 1. Wash your hands with soap and water. 2. Sit far back on the toilet seat. 3. Put several pieces of toilet paper into the toilet water. This will prevent splashing as you empty stool into the toilet. 4. Remove the clip or the hook-and-loop fastener from the tail end of the bag. 5. Unroll the tail, then empty the stool into the toilet. 6. Clean the tail with toilet paper or a moist towelette. 7. Reroll the tail, and close it with the clip or the hook-and-loop fastener. 8. Wash your hands again. How to change the colostomy bag Change your bag every 3-4 days or as often as told by your health care provider. Also change the bag if it is leaking or separating from the skin, or if your skin around the stoma looks or feels irritated. Irritated skin may be a sign that the bag is leaking. Always have colostomy supplies with you, and follow these basic steps: 1. Wash your hands with soap and water. Have paper towels or tissues nearby to clean any discharge. 2. Remove the old bag and skin barrier. Use your fingers or a warm cloth to gently push the skin away from the barrier. 3. Clean the stoma area with water or with mild soap and water, as directed. Use water to rinse away any soap. 4. Dry the skin. You may use the cool setting on a hair dryer to do this. 5. Use a tracing pattern (template) to cut the skin barrier to the size needed. 6. If you are using a two-piece bag, attach the bag and the skin barrier to each other. Add the barrier ring, if you use one. 7. If directed, apply stoma  powder or skin barrier gel to the skin. 8. Warm the skin barrier with your hands, or blow with a hair dryer for 5-10 seconds. 9. Remove the paper from the adhesive strip of the skin barrier. 10. Press the adhesive strip onto the skin around the stoma. 11. Gently rub the skin barrier onto the skin. This creates heat that helps the barrier to stick. 12. Apply stoma tape to the edges of the skin barrier, if desired. 54. Wash your hands again. General recommendations  Avoid wearing tight clothes or having anything press directly on your stoma or bag. Change your clothing whenever it is soiled or damp.  You may shower or bathe with the bag on or off. Do not use harsh or oily soaps or lotions. Dry the skin and bag after bathing.  Store all supplies in a cool, dry place. Do not leave supplies in extreme heat because some parts can melt or not stick as well.  Whenever you leave home, take extra clothing and an extra skin barrier and bag with you.  If your bag gets wet, you can dry it with a hair dryer on the cool setting.  To prevent odor, you may put drops of ostomy deodorizer in the bag.  If recommended by your health care provider, put ostomy lubricant inside the bag. This helps stool to slide out of the bag more easily and completely. Contact a health care provider if:  You have new or worsening redness, swelling, or pain around your stoma.  You have new or increased fluid or blood coming from your stoma.  Your stoma feels warm to the touch.  You have pus coming from your stoma.  Your stoma extends in or out farther  than normal.  You need to change your bag every day.  You have a fever. Get help right away if:  Your stool is bloody.  You have nausea or you vomit.  You have trouble breathing. Summary  Measure your stoma opening regularly and record the size. Watch for changes.  Empty your bag at bedtime and whenever it is one-third to one-half full. Do not let the bag get  more than half-full with stool or gas.  Change your bag every 3-4 days or as often as told by your health care provider.  Whenever you leave home, take extra clothing and an extra skin barrier and bag with you. This information is not intended to replace advice given to you by your health care provider. Make sure you discuss any questions you have with your health care provider. Document Released: 10/15/2003 Document Revised: 06/17/2018 Document Reviewed: 04/07/2017 Elsevier Interactive Patient Education  [employer].

## 2018-11-21 NOTE — Care Management Important Message (Signed)
Important Message  Patient Details  Name: [name] MRN: [id number] Date of Birth: January 16, 1941   Medicare Important Message Given:  Yes    Shelda Altes 11/21/2018, 1:29 PM

## 2018-11-21 NOTE — Care Management (Signed)
Patient discharging today. Will continue with referral to [place]. Vaughan Basta of [facility] notified and will obtain orders via Epic.  Patient discharging home in care of family.

## 2018-11-21 NOTE — Progress Notes (Signed)
Discharge instructions given on medications and follow up visits,patient verbalized understanding. Prescriptions sent to Pharmacy of choice documented on AVS. Accompanied by staff to an awaiting vehicle.

## 2018-11-21 NOTE — Discharge Summary (Signed)
Physician Discharge Summary  Patient ID: [name] MRN: [id number] DOB/AGE: 1941-06-22 78 y.o.  Admit date: 11/12/2018 Discharge date: 11/21/2018  Admission Diagnoses: Large Bowel Perforation   Discharge Diagnoses:  Principal Problem:   Large bowel perforation ([place]) Active Problems:   Human immunodeficiency virus (HIV) disease ([place])   Acute cystitis   HTN (hypertension)   CKD (chronic kidney disease) stage 3, GFR 30-59 ml/min (HCC)   COPD (chronic obstructive pulmonary disease) (HCC)   Hypothyroidism   Obesity (BMI 30-39.9)   Constipation due to opioid therapy   Lactic acidosis   Peritonitis (HCC)   Stercoral ulcer of large intestine   Discharged Condition: good  Hospital Course: [name] is a 78 yo who presented to the hospital with pneumoperitoneum related to a large bowel perforation and peritonitis. She was found to have a stercoral ulcer with erosion of stool into her abdominal cavity. She underwent a partial colectomy and end colostomy. Post operatively, she had some septic shock and required Iv pressors. She weaned from these over the course of a few days. She also had some post operative A fib and was started on a Cardizem gtt and this was changed to orals.  Over the past week, she has worked with physical therapy to get stronger, and had been eating and drinking. Her ostomy is making output, and she has been getting daily dressing changes to her midline wound which was left open due to her fecal contamination.    The patient has help at home with her friend and her daughter in law. She will get home health RN and PT.   She has follow up with cardiology regarding her Atrial fibrillation in a few weeks.  There was no cancer in her pathology.   Pathology: Diagnosis 1. Colon, segmental resection, descending - SEGMENT OF COLON (23 CM) SHOWING ISCHEMIC CHANGES, NECROSIS, AND TRANSMURAL WALL DEFECT WITH ASSOCIATED FAT NECROSIS AND SEROSITIS, CONSISTENT WITH PATIENTS  CLINICAL HISTORY OF PERFORATION. - MARGINS APPEAR VIABLE. 2. Colon, segmental resection, proximal descending - SEGMENT OF COLON (5.6 CM) WITH MILD SEROSITIS. - MARGINS APPEAR VIABLE.  Consults: cardiology and Physical Therapy  Significant Diagnostic Studies: CT a/p with perforated large bowel   Treatments: Ex lap, partial colectomy with end colostomy   Discharge Exam: Blood pressure (!) 173/89, pulse 78, temperature 98.9 F (37.2 C), temperature source Oral, resp. rate 17, height 5\' 4"  (1.626 m), weight 91.5 kg, SpO2 92 %. General appearance: alert, cooperative and no distress Resp: normal work breathing GI: soft, appropriately tender, midline with pink tissue, no erythema or drainage, ostomy pink and edematous, stool and gas in bag Extremities: extremities normal, atraumatic, no cyanosis or edema  Disposition: Discharge disposition: 01-Home or Self Care       Discharge Instructions    Call MD for:  difficulty breathing, headache or visual disturbances   Complete by:  As directed    Call MD for:  extreme fatigue   Complete by:  As directed    Call MD for:  persistant dizziness or light-headedness   Complete by:  As directed    Call MD for:  persistant nausea and vomiting   Complete by:  As directed    Call MD for:  redness, tenderness, or signs of infection (pain, swelling, redness, odor or green/yellow discharge around incision site)   Complete by:  As directed    Call MD for:  severe uncontrolled pain   Complete by:  As directed    Call MD for:  temperature >100.4   Complete by:  As directed    Diet - low sodium heart healthy   Complete by:  As directed    Increase activity slowly   Complete by:  As directed      Allergies as of 11/21/2018      Reactions   Bee Venom Anaphylaxis   Promethazine Swelling   Tenofovir Disoproxil Fumarate [tenofovir Disoproxil Fumarate] Other (See Comments)   Renal failure, (08/27/2006)   Compazine  [prochlorperazine Edisylate] Nausea  And Vomiting   Haloperidol Lactate Other (See Comments)   Reaction is muscle tension, causes severe spasms in face and neck. Forces eyes to roll in the back of the head   Brompheniramine-acetaminophen    Chlorcyclizine    Chlorpromazine    Codeine Itching, Nausea And Vomiting   Navane [thiothixene] Other (See Comments), Nausea And Vomiting   Same muscle spasm reaction as haldol   Other    NOVACAINE   Prednisone    Brief mild psychosis and agitation   Prochlorperazine Nausea And Vomiting   Promethazine Hcl Other (See Comments)   Same as haldol    Propoxyphene N-acetaminophen Itching, Nausea And Vomiting   Morphine Itching, Swelling      Medication List    STOP taking these medications   fluconazole 200 MG tablet Commonly known as:  DIFLUCAN   Oxycodone HCl 10 MG Tabs     TAKE these medications   albuterol 108 (90 Base) MCG/ACT inhaler Commonly known as:  PROVENTIL HFA;VENTOLIN HFA Inhale 1-2 puffs into the lungs every 6 (six) hours as needed for wheezing or shortness of breath.   amitriptyline 50 MG tablet Commonly known as:  ELAVIL Take 50 mg by mouth at bedtime.   BENADRYL 25 mg capsule Generic drug:  diphenhydrAMINE Take 25 mg by mouth daily as needed for itching (itching from hydrocodone).   cephALEXin 500 MG capsule Commonly known as:  KEFLEX Take 1 capsule (500 mg total) by mouth 3 (three) times daily.   diltiazem 30 MG tablet Commonly known as:  CARDIZEM Take 1 tablet (30 mg total) by mouth 2 (two) times daily.   docusate sodium 100 MG capsule Commonly known as:  COLACE Take 1 capsule (100 mg total) by mouth 2 (two) times daily.   fluticasone 50 MCG/ACT nasal spray Commonly known as:  FLONASE Place 2 sprays into both nostrils at bedtime.   furosemide 40 MG tablet Commonly known as:  LASIX Take 40 mg by mouth daily as needed for fluid.   guaiFENesin 600 MG 12 hr tablet Commonly known as:  MUCINEX Take 1 tablet (600 mg total) by mouth 2 (two) times  daily.   hydrALAZINE 100 MG tablet Commonly known as:  APRESOLINE Take 1 tablet (100 mg total) by mouth every 8 (eight) hours.   ipratropium-albuterol 0.5-2.5 (3) MG/3ML Soln Commonly known as:  DUONEB Inhale 3 mLs into the lungs every 6 (six) hours as needed (shortness of breath).   LORazepam 0.5 MG tablet Commonly known as:  ATIVAN Take 0.5 mg by mouth 2 (two) times daily.   nebivolol 10 MG tablet Commonly known as:  BYSTOLIC Take 10 mg by mouth daily.   ondansetron 4 MG disintegrating tablet Commonly known as:  ZOFRAN-ODT Take 1 tablet (4 mg total) by mouth every 6 (six) hours as needed for nausea.   ondansetron 8 MG tablet Commonly known as:  ZOFRAN Take 8 mg by mouth daily as needed for nausea or vomiting.   ONE-A-DAY 55 PLUS PO Take  1 tablet by mouth daily.   phenazopyridine 200 MG tablet Commonly known as:  PYRIDIUM Take 1 tablet (200 mg total) by mouth 3 (three) times daily as needed for pain.   polyethylene glycol packet Commonly known as:  MIRALAX / GLYCOLAX Take 17 g by mouth daily. Start taking on:  November 22, 2018   polyvinyl alcohol 1.4 % ophthalmic solution Commonly known as:  LIQUIFILM TEARS Place 1 drop into both eyes as needed for dry eyes.   PREMARIN vaginal cream Generic drug:  conjugated estrogens Place 1 Applicatorful vaginally at bedtime.   thiamine 100 MG tablet Commonly known as:  VITAMIN B-1 Take 100 mg by mouth daily.   traMADol 50 MG tablet Commonly known as:  ULTRAM Take 1 tablet (50 mg total) by mouth every 6 (six) hours as needed for severe pain.   TRIUMEQ 600-50-300 MG tablet Generic drug:  abacavir-dolutegravir-lamiVUDine TAKE 1 TABLET ONCE DAILY.   Vitamin D3 250 MCG (10000 UT) Tabs Take 1 tablet by mouth daily.      Follow-up Information    [facility] Follow up.   Contact information: [address] [place], [place] M, PA-C Follow up on  12/13/2018.   Specialties:  Physician Assistant, Cardiology Why:  [place] on 12/13/2018 at 3:00 PM.  Contact information: [place] [address] [phone]        Virl Cagey, MD Follow up on 11/29/2018.   Specialty:  General Surgery Why:  wound check Contact information: [address] Linna Hoff [facility] [address] [phone]        Octavio Graves, DO Follow up in 2 week(s).   Contact information: [address] [address] [phone]           Discharge Open Abdominal Surgery Instructions:  Common Complaints: Pain at the incision site is common. This will improve with time. Take your pain medications as described below. Some nausea is common and poor appetite. The main goal is to stay hydrated the first few days after surgery.   Diet/ Activity: Diet as tolerated. You have started and tolerated a diet in the hospital, and should continue to increase what you are able to eat.   You may not have a large appetite, but it is important to stay hydrated. Drink 64 ounces of water a day. Your appetite will return with time.  Keep your wound packed daily with damp to dry gauze and place a pad and paper tape over the wound. Keep your ostomy changed as per the instructions from the ostomy RN.  You will have home health RN to come and help with wound and ostomy care. You will have a physical therapist to help with therapy and getting stronger.  You have been started on some new blood pressure and heart rate medications, take these as prescribed. You will be seeing cardiology as an outpatient and they will decide if these medications need to be stopped.  You may Shower but keep your ostomy bag in place and plan to replace it after the shower. The wound dressing should remain in place and then make sure the wound is dry and replace the packing after the shower.  Do not take baths or submerge. Wear an abdominal binder daily  with activity. You do not have to wear this while sleeping or sitting.  Rest and listen to your body, but do not remain in bed  all day.  Walk everyday for at least 15-20 minutes. Deep cough and move around every 1-2 hours in the first few days after surgery.  Do not lift > 10 lbs, perform excessive bending, pushing, pulling, squatting for 6-8 weeks after surgery.  The activity restrictions and the abdominal binder are to prevent hernia formation at your incision while you are healing.  Do not place lotions or balms on your incision unless instructed to specifically by Dr. Constance Haw.   Medication: Take tylenol and ibuprofen as needed for pain control, alternating every 4-6 hours.  Example:  Tylenol 1000mg  @ 6am, 12noon, 6pm, 45midnight (Do not exceed 4000mg  of tylenol a day). Ibuprofen 800mg  @ 9am, 3pm, 9pm, 3am (Do not exceed 3600mg  of ibuprofen a day).  Take Tramadol for breakthrough pain as needed.  Take Colace for constipation related to narcotic pain medication. If you do not have a bowel movement in 2 days, take Miralax over the counter.  Drink plenty of water to also prevent constipation.   Contact Information: If you have questions or concerns, please call our office, [phone], Monday- Thursday 8AM-5PM and Friday 8AM-12Noon.  If it is after hours or on the weekend, please call [facility] Main Number, [phone], and ask to speak to the surgeon on call for Dr. Constance Haw at [facility].   Signed: Virl Cagey 11/21/2018, 12:25 PM

## 2018-11-22 ENCOUNTER — Other Ambulatory Visit: Payer: Self-pay | Admitting: Infectious Disease

## 2018-11-22 ENCOUNTER — Telehealth: Payer: Self-pay | Admitting: Student

## 2018-11-22 DIAGNOSIS — N183 Chronic kidney disease, stage 3 (moderate): Secondary | ICD-10-CM | POA: Diagnosis not present

## 2018-11-22 DIAGNOSIS — Z433 Encounter for attention to colostomy: Secondary | ICD-10-CM | POA: Diagnosis not present

## 2018-11-22 DIAGNOSIS — K5903 Drug induced constipation: Secondary | ICD-10-CM | POA: Diagnosis not present

## 2018-11-22 DIAGNOSIS — B20 Human immunodeficiency virus [HIV] disease: Secondary | ICD-10-CM | POA: Diagnosis not present

## 2018-11-22 DIAGNOSIS — I129 Hypertensive chronic kidney disease with stage 1 through stage 4 chronic kidney disease, or unspecified chronic kidney disease: Secondary | ICD-10-CM | POA: Diagnosis not present

## 2018-11-22 DIAGNOSIS — Z4801 Encounter for change or removal of surgical wound dressing: Secondary | ICD-10-CM | POA: Diagnosis not present

## 2018-11-22 DIAGNOSIS — G629 Polyneuropathy, unspecified: Secondary | ICD-10-CM | POA: Diagnosis not present

## 2018-11-22 DIAGNOSIS — J449 Chronic obstructive pulmonary disease, unspecified: Secondary | ICD-10-CM | POA: Diagnosis not present

## 2018-11-22 DIAGNOSIS — Z48815 Encounter for surgical aftercare following surgery on the digestive system: Secondary | ICD-10-CM | POA: Diagnosis not present

## 2018-11-22 NOTE — Telephone Encounter (Signed)
Spoke with Malachy Mood [facility])  And she stated that she was at the patients house and pt told her she will not be taking her hydralazine nor her diltiazem any longer as it causes her to have terrible feelings in her eyes "like they are rolling back in her head." HH[facility] is asking if there is other medications that she can try. Her bp today was 178/84 HR 80. She was just discharged from hospital yesterday, so she was taking that medication up until then. Please advise.

## 2018-11-22 NOTE — Telephone Encounter (Signed)
Please call Malachy Mood w/ [facility] @ [phone]

## 2018-11-23 NOTE — Telephone Encounter (Signed)
Spoke with Malachy Mood [facility]) and advised her of [place] Strader's recommendations. She voiced understanding,. She asked me to also call and discuss it with pt. I called pt., no answer, no voicemail set up.

## 2018-11-23 NOTE — Telephone Encounter (Signed)
    Would not recommend stopping both medications at the same time given her elevated readings. Please confirm she is still taking Bystolic 10 mg daily.  By review of Dr. Nelly Laurence most recent note, Diltiazem was only going to be for a short period of time, therefore could stop this but would remain on Hydralazine currently. If symptoms persist, could then review switching to different medications.  Signed, Erma Heritage, PA-C 11/23/2018, 7:23 AM Pager: [phone]

## 2018-11-24 DIAGNOSIS — Z4801 Encounter for change or removal of surgical wound dressing: Secondary | ICD-10-CM | POA: Diagnosis not present

## 2018-11-24 DIAGNOSIS — Z48815 Encounter for surgical aftercare following surgery on the digestive system: Secondary | ICD-10-CM | POA: Diagnosis not present

## 2018-11-24 DIAGNOSIS — J449 Chronic obstructive pulmonary disease, unspecified: Secondary | ICD-10-CM | POA: Diagnosis not present

## 2018-11-24 DIAGNOSIS — K5903 Drug induced constipation: Secondary | ICD-10-CM | POA: Diagnosis not present

## 2018-11-24 DIAGNOSIS — I129 Hypertensive chronic kidney disease with stage 1 through stage 4 chronic kidney disease, or unspecified chronic kidney disease: Secondary | ICD-10-CM | POA: Diagnosis not present

## 2018-11-24 DIAGNOSIS — G629 Polyneuropathy, unspecified: Secondary | ICD-10-CM | POA: Diagnosis not present

## 2018-11-24 DIAGNOSIS — N183 Chronic kidney disease, stage 3 (moderate): Secondary | ICD-10-CM | POA: Diagnosis not present

## 2018-11-24 DIAGNOSIS — Z433 Encounter for attention to colostomy: Secondary | ICD-10-CM | POA: Diagnosis not present

## 2018-11-24 DIAGNOSIS — B20 Human immunodeficiency virus [HIV] disease: Secondary | ICD-10-CM | POA: Diagnosis not present

## 2018-11-24 NOTE — Telephone Encounter (Signed)
Attempt to reach patient, no answer, no machine-cc   I spoke with [facility] nurse, Malachy Mood, she states patient is still taking bystolic 10 mg qd, but will not take hydralazine as it makes her eyes feel like they are "being pulled up into her head".She has already stopped Diltiazem   Malachy Mood plans to see patient today and check her BP and let us konw

## 2018-11-28 DIAGNOSIS — Z433 Encounter for attention to colostomy: Secondary | ICD-10-CM | POA: Diagnosis not present

## 2018-11-28 DIAGNOSIS — N183 Chronic kidney disease, stage 3 (moderate): Secondary | ICD-10-CM | POA: Diagnosis not present

## 2018-11-28 DIAGNOSIS — Z48815 Encounter for surgical aftercare following surgery on the digestive system: Secondary | ICD-10-CM | POA: Diagnosis not present

## 2018-11-28 DIAGNOSIS — B20 Human immunodeficiency virus [HIV] disease: Secondary | ICD-10-CM | POA: Diagnosis not present

## 2018-11-28 DIAGNOSIS — Z4801 Encounter for change or removal of surgical wound dressing: Secondary | ICD-10-CM | POA: Diagnosis not present

## 2018-11-28 DIAGNOSIS — I129 Hypertensive chronic kidney disease with stage 1 through stage 4 chronic kidney disease, or unspecified chronic kidney disease: Secondary | ICD-10-CM | POA: Diagnosis not present

## 2018-11-28 DIAGNOSIS — K5903 Drug induced constipation: Secondary | ICD-10-CM | POA: Diagnosis not present

## 2018-11-28 DIAGNOSIS — G629 Polyneuropathy, unspecified: Secondary | ICD-10-CM | POA: Diagnosis not present

## 2018-11-28 DIAGNOSIS — J449 Chronic obstructive pulmonary disease, unspecified: Secondary | ICD-10-CM | POA: Diagnosis not present

## 2018-11-29 ENCOUNTER — Encounter: Payer: Self-pay | Admitting: General Surgery

## 2018-11-29 ENCOUNTER — Ambulatory Visit (INDEPENDENT_AMBULATORY_CARE_PROVIDER_SITE_OTHER): Payer: Self-pay | Admitting: General Surgery

## 2018-11-29 VITALS — BP 143/88 | HR 74 | Temp 97.3°F

## 2018-11-29 DIAGNOSIS — K631 Perforation of intestine (nontraumatic): Secondary | ICD-10-CM

## 2018-11-29 NOTE — Progress Notes (Signed)
[facility]   HPI:  78 y.o. Female presents to clinic for post-op follow-up evaluation after Ex lap partial colectomy with end colostomy for sterocoral ulcer. She is doing well. She is getting stronger and home health RN is seeing her. She has been doing PT also. Her dressing changes are going well and the home health RN asked about a wound vacuum.  Appetite improving. Some issues with the colostomy appliance peeling off.   Review of Systems:  No fever or chills Wound healing, packing going well  Ostomy less edematous and pink  All other review of systems: otherwise negative   Vital Signs:  BP (!) 143/88 (BP Location: Right Arm, Patient Position: Sitting, Cuff Size: Large)   Pulse 74   Temp (!) 97.3 F (36.3 C) (Temporal)    Physical Exam:  Physical Exam Vitals signs reviewed.  HENT:     Nose: Nose normal.     Mouth/Throat:     Mouth: Mucous membranes are moist.  Eyes:     Pupils: Pupils are equal, round, and reactive to light.  Neck:     Musculoskeletal: Normal range of motion.  Cardiovascular:     Rate and Rhythm: Normal rate.  Pulmonary:     Effort: Pulmonary effort is normal.  Abdominal:     General: There is no distension.     Palpations: Abdomen is soft.     Tenderness: There is abdominal tenderness.     Comments: Midline 2 separate wounds total measuring about 10 long X 3 wide X2cm deep, beef granulation tissue, no erythema, ostomy pink and less edematous, bag leaking due to crease/ fold of the abdomen, skin healthy, bag replaced  Skin:    General: Skin is warm.  Neurological:     General: No focal deficit present.     Mental Status: She is alert and oriented to person, place, and time.  Psychiatric:        Mood and Affect: Mood normal.        Behavior: Behavior normal.        Thought Content: Thought content normal.        Judgment: Judgment normal.     Assessment:  78 y.o. yo Female s/p Ex lap, partial colectomy with end colostomy  for stercoral ulcer with perforation. Doing well. Will start wound vacuum therapy soon.   Plan:  - Black sponge to the areas, skin protector down and skin bridge between the wounds; 125 mm Hg continuous suction, change 3 times a week; if leaks informed caregiver to do wet to drys and remove the vacuum if cannot get seal  - Ostomy appliance replaced and education given on possible remedies for the leaking  - Will see back for a wound check   Future Appointments  Date Time Provider [place]  12/13/2018  3:00 PM Ahmed Prima, Fransisco Hertz, PA-C [facility] [place] [facility]  12/27/2018 10:30 AM Virl Cagey, MD [facility] None    All of the above recommendations were discussed with the patient and patient's family, and all of patient's and family's questions were answered to their expressed satisfaction.  Curlene Labrum, MD [facility] [address] [place],  [address] [phone] (office)

## 2018-11-29 NOTE — Patient Instructions (Addendum)
Continue daily dressing changes and ostomy care. Home Health RN will be starting wound vacuum therapy soon. Continue with physical therapy. Diet as tolerated. Keep stools soft.

## 2018-12-01 DIAGNOSIS — Z4801 Encounter for change or removal of surgical wound dressing: Secondary | ICD-10-CM | POA: Diagnosis not present

## 2018-12-01 DIAGNOSIS — I129 Hypertensive chronic kidney disease with stage 1 through stage 4 chronic kidney disease, or unspecified chronic kidney disease: Secondary | ICD-10-CM | POA: Diagnosis not present

## 2018-12-01 DIAGNOSIS — N183 Chronic kidney disease, stage 3 (moderate): Secondary | ICD-10-CM | POA: Diagnosis not present

## 2018-12-01 DIAGNOSIS — Z433 Encounter for attention to colostomy: Secondary | ICD-10-CM | POA: Diagnosis not present

## 2018-12-01 DIAGNOSIS — B20 Human immunodeficiency virus [HIV] disease: Secondary | ICD-10-CM | POA: Diagnosis not present

## 2018-12-01 DIAGNOSIS — Z48815 Encounter for surgical aftercare following surgery on the digestive system: Secondary | ICD-10-CM | POA: Diagnosis not present

## 2018-12-01 DIAGNOSIS — K5903 Drug induced constipation: Secondary | ICD-10-CM | POA: Diagnosis not present

## 2018-12-01 DIAGNOSIS — G629 Polyneuropathy, unspecified: Secondary | ICD-10-CM | POA: Diagnosis not present

## 2018-12-01 DIAGNOSIS — J449 Chronic obstructive pulmonary disease, unspecified: Secondary | ICD-10-CM | POA: Diagnosis not present

## 2018-12-06 DIAGNOSIS — G629 Polyneuropathy, unspecified: Secondary | ICD-10-CM | POA: Diagnosis not present

## 2018-12-06 DIAGNOSIS — B20 Human immunodeficiency virus [HIV] disease: Secondary | ICD-10-CM | POA: Diagnosis not present

## 2018-12-06 DIAGNOSIS — N183 Chronic kidney disease, stage 3 (moderate): Secondary | ICD-10-CM | POA: Diagnosis not present

## 2018-12-06 DIAGNOSIS — Z48815 Encounter for surgical aftercare following surgery on the digestive system: Secondary | ICD-10-CM | POA: Diagnosis not present

## 2018-12-06 DIAGNOSIS — I129 Hypertensive chronic kidney disease with stage 1 through stage 4 chronic kidney disease, or unspecified chronic kidney disease: Secondary | ICD-10-CM | POA: Diagnosis not present

## 2018-12-06 DIAGNOSIS — Z4801 Encounter for change or removal of surgical wound dressing: Secondary | ICD-10-CM | POA: Diagnosis not present

## 2018-12-06 DIAGNOSIS — J449 Chronic obstructive pulmonary disease, unspecified: Secondary | ICD-10-CM | POA: Diagnosis not present

## 2018-12-06 DIAGNOSIS — K5903 Drug induced constipation: Secondary | ICD-10-CM | POA: Diagnosis not present

## 2018-12-06 DIAGNOSIS — Z433 Encounter for attention to colostomy: Secondary | ICD-10-CM | POA: Diagnosis not present

## 2018-12-08 DIAGNOSIS — Z4801 Encounter for change or removal of surgical wound dressing: Secondary | ICD-10-CM | POA: Diagnosis not present

## 2018-12-08 DIAGNOSIS — Z433 Encounter for attention to colostomy: Secondary | ICD-10-CM | POA: Diagnosis not present

## 2018-12-08 DIAGNOSIS — I129 Hypertensive chronic kidney disease with stage 1 through stage 4 chronic kidney disease, or unspecified chronic kidney disease: Secondary | ICD-10-CM | POA: Diagnosis not present

## 2018-12-08 DIAGNOSIS — B20 Human immunodeficiency virus [HIV] disease: Secondary | ICD-10-CM | POA: Diagnosis not present

## 2018-12-08 DIAGNOSIS — Z48815 Encounter for surgical aftercare following surgery on the digestive system: Secondary | ICD-10-CM | POA: Diagnosis not present

## 2018-12-08 DIAGNOSIS — K5903 Drug induced constipation: Secondary | ICD-10-CM | POA: Diagnosis not present

## 2018-12-08 DIAGNOSIS — N183 Chronic kidney disease, stage 3 (moderate): Secondary | ICD-10-CM | POA: Diagnosis not present

## 2018-12-08 DIAGNOSIS — J449 Chronic obstructive pulmonary disease, unspecified: Secondary | ICD-10-CM | POA: Diagnosis not present

## 2018-12-08 DIAGNOSIS — G629 Polyneuropathy, unspecified: Secondary | ICD-10-CM | POA: Diagnosis not present

## 2018-12-13 ENCOUNTER — Encounter: Payer: Self-pay | Admitting: General Surgery

## 2018-12-13 ENCOUNTER — Ambulatory Visit: Payer: Self-pay | Admitting: Student

## 2018-12-13 ENCOUNTER — Ambulatory Visit (INDEPENDENT_AMBULATORY_CARE_PROVIDER_SITE_OTHER): Payer: Self-pay | Admitting: General Surgery

## 2018-12-13 VITALS — BP 95/67 | HR 74 | Temp 97.1°F | Resp 18 | Wt 184.0 lb

## 2018-12-13 DIAGNOSIS — K631 Perforation of intestine (nontraumatic): Secondary | ICD-10-CM

## 2018-12-13 NOTE — Patient Instructions (Signed)
Continue dressing change. Metamucil for bulky stools, try to get off softeners / laxative.

## 2018-12-13 NOTE — Progress Notes (Signed)
[facility] Note   HPI:  78 y.o. Female presents to clinic for follow-up evaluation after partial colectomy with end colostomy for stercoral ulcer. She is doing well and her wound is healing nicely; she is having formed stool but it is harder at times. Not using walker/ wheel chair now to walk into the office.   Review of Systems:  No fever or chills Strength improving  All other review of systems: otherwise negative   Vital Signs:  BP 95/67 (BP Location: Left Arm, Patient Position: Sitting, Cuff Size: Large)   Pulse 74   Temp (!) 97.1 F (36.2 C) (Temporal)   Resp 18   Wt 184 lb (83.5 kg)   BMI 31.58 kg/m    Physical Exam:  Physical Exam Vitals signs reviewed.  HENT:     Head: Normocephalic.     Mouth/Throat:     Mouth: Mucous membranes are moist.  Cardiovascular:     Rate and Rhythm: Normal rate.  Pulmonary:     Effort: Pulmonary effort is normal.  Abdominal:     General: There is no distension.     Palpations: Abdomen is soft.     Tenderness: There is no abdominal tenderness.     Comments: Midline with great granulation, no erythema or drainage, ostomy pink with formed stool  Musculoskeletal: Normal range of motion.  Skin:    General: Skin is warm.  Neurological:     General: No focal deficit present.     Mental Status: She is alert.  Psychiatric:        Mood and Affect: Mood normal.        Behavior: Behavior normal.        Thought Content: Thought content normal.        Judgment: Judgment normal.      Assessment:  78 y.o. yo Female doing well after partial colectomy with end colostomy, healing.  Plan:  Continue dressing change. Metamucil for bulky stools, try to get off softeners / laxative.  Will need a barium enema and will need to repeat colonoscopy as her last was in 2012 prior to completing a reversal of the colostomy HIV is well controlled so this should not be an issue for reversal   All of the above recommendations were  discussed with the patient and patient's family, and all of patient's and family's questions were answered to their expressed satisfaction.   Future Appointments  Date Time Provider [place]  01/10/2019 11:15 AM Virl Cagey, MD [facility] None  01/10/2019  1:00 PM Ahmed Prima, Fransisco Hertz, PA-C [place], MD [facility] [address] [place], [place] [address] [phone] (office)

## 2018-12-14 ENCOUNTER — Telehealth: Payer: Self-pay | Admitting: Emergency Medicine

## 2018-12-14 DIAGNOSIS — Z433 Encounter for attention to colostomy: Secondary | ICD-10-CM | POA: Diagnosis not present

## 2018-12-14 DIAGNOSIS — I129 Hypertensive chronic kidney disease with stage 1 through stage 4 chronic kidney disease, or unspecified chronic kidney disease: Secondary | ICD-10-CM | POA: Diagnosis not present

## 2018-12-14 DIAGNOSIS — G629 Polyneuropathy, unspecified: Secondary | ICD-10-CM | POA: Diagnosis not present

## 2018-12-14 DIAGNOSIS — Z4801 Encounter for change or removal of surgical wound dressing: Secondary | ICD-10-CM | POA: Diagnosis not present

## 2018-12-14 DIAGNOSIS — K5903 Drug induced constipation: Secondary | ICD-10-CM | POA: Diagnosis not present

## 2018-12-14 DIAGNOSIS — B20 Human immunodeficiency virus [HIV] disease: Secondary | ICD-10-CM | POA: Diagnosis not present

## 2018-12-14 DIAGNOSIS — N183 Chronic kidney disease, stage 3 (moderate): Secondary | ICD-10-CM | POA: Diagnosis not present

## 2018-12-14 DIAGNOSIS — J449 Chronic obstructive pulmonary disease, unspecified: Secondary | ICD-10-CM | POA: Diagnosis not present

## 2018-12-14 DIAGNOSIS — Z48815 Encounter for surgical aftercare following surgery on the digestive system: Secondary | ICD-10-CM | POA: Diagnosis not present

## 2018-12-14 NOTE — Telephone Encounter (Signed)
Spoke with  [facility], they stated there is was one staple left in patients surgical site and they wanted to know if it could be removed, notified the provide and  A verbal order for  The staple to be removed was given.

## 2018-12-15 DIAGNOSIS — Z4801 Encounter for change or removal of surgical wound dressing: Secondary | ICD-10-CM | POA: Diagnosis not present

## 2018-12-15 DIAGNOSIS — Z433 Encounter for attention to colostomy: Secondary | ICD-10-CM | POA: Diagnosis not present

## 2018-12-15 DIAGNOSIS — J449 Chronic obstructive pulmonary disease, unspecified: Secondary | ICD-10-CM | POA: Diagnosis not present

## 2018-12-15 DIAGNOSIS — K5903 Drug induced constipation: Secondary | ICD-10-CM | POA: Diagnosis not present

## 2018-12-15 DIAGNOSIS — B20 Human immunodeficiency virus [HIV] disease: Secondary | ICD-10-CM | POA: Diagnosis not present

## 2018-12-15 DIAGNOSIS — Z48815 Encounter for surgical aftercare following surgery on the digestive system: Secondary | ICD-10-CM | POA: Diagnosis not present

## 2018-12-15 DIAGNOSIS — I129 Hypertensive chronic kidney disease with stage 1 through stage 4 chronic kidney disease, or unspecified chronic kidney disease: Secondary | ICD-10-CM | POA: Diagnosis not present

## 2018-12-15 DIAGNOSIS — G629 Polyneuropathy, unspecified: Secondary | ICD-10-CM | POA: Diagnosis not present

## 2018-12-15 DIAGNOSIS — N183 Chronic kidney disease, stage 3 (moderate): Secondary | ICD-10-CM | POA: Diagnosis not present

## 2018-12-16 ENCOUNTER — Other Ambulatory Visit: Payer: Self-pay | Admitting: Infectious Disease

## 2018-12-16 DIAGNOSIS — B20 Human immunodeficiency virus [HIV] disease: Secondary | ICD-10-CM

## 2018-12-21 DIAGNOSIS — J449 Chronic obstructive pulmonary disease, unspecified: Secondary | ICD-10-CM | POA: Diagnosis not present

## 2018-12-21 DIAGNOSIS — N183 Chronic kidney disease, stage 3 (moderate): Secondary | ICD-10-CM | POA: Diagnosis not present

## 2018-12-21 DIAGNOSIS — K5903 Drug induced constipation: Secondary | ICD-10-CM | POA: Diagnosis not present

## 2018-12-21 DIAGNOSIS — B20 Human immunodeficiency virus [HIV] disease: Secondary | ICD-10-CM | POA: Diagnosis not present

## 2018-12-21 DIAGNOSIS — Z433 Encounter for attention to colostomy: Secondary | ICD-10-CM | POA: Diagnosis not present

## 2018-12-21 DIAGNOSIS — I129 Hypertensive chronic kidney disease with stage 1 through stage 4 chronic kidney disease, or unspecified chronic kidney disease: Secondary | ICD-10-CM | POA: Diagnosis not present

## 2018-12-21 DIAGNOSIS — Z48815 Encounter for surgical aftercare following surgery on the digestive system: Secondary | ICD-10-CM | POA: Diagnosis not present

## 2018-12-21 DIAGNOSIS — G629 Polyneuropathy, unspecified: Secondary | ICD-10-CM | POA: Diagnosis not present

## 2018-12-21 DIAGNOSIS — Z4801 Encounter for change or removal of surgical wound dressing: Secondary | ICD-10-CM | POA: Diagnosis not present

## 2018-12-27 ENCOUNTER — Ambulatory Visit: Payer: Self-pay | Admitting: General Surgery

## 2018-12-28 DIAGNOSIS — J449 Chronic obstructive pulmonary disease, unspecified: Secondary | ICD-10-CM | POA: Diagnosis not present

## 2018-12-28 DIAGNOSIS — Z4801 Encounter for change or removal of surgical wound dressing: Secondary | ICD-10-CM | POA: Diagnosis not present

## 2018-12-28 DIAGNOSIS — Z433 Encounter for attention to colostomy: Secondary | ICD-10-CM | POA: Diagnosis not present

## 2018-12-28 DIAGNOSIS — N183 Chronic kidney disease, stage 3 (moderate): Secondary | ICD-10-CM | POA: Diagnosis not present

## 2018-12-28 DIAGNOSIS — K5903 Drug induced constipation: Secondary | ICD-10-CM | POA: Diagnosis not present

## 2018-12-28 DIAGNOSIS — B20 Human immunodeficiency virus [HIV] disease: Secondary | ICD-10-CM | POA: Diagnosis not present

## 2018-12-28 DIAGNOSIS — Z48815 Encounter for surgical aftercare following surgery on the digestive system: Secondary | ICD-10-CM | POA: Diagnosis not present

## 2018-12-28 DIAGNOSIS — G629 Polyneuropathy, unspecified: Secondary | ICD-10-CM | POA: Diagnosis not present

## 2018-12-28 DIAGNOSIS — I129 Hypertensive chronic kidney disease with stage 1 through stage 4 chronic kidney disease, or unspecified chronic kidney disease: Secondary | ICD-10-CM | POA: Diagnosis not present

## 2019-01-03 DIAGNOSIS — Z933 Colostomy status: Secondary | ICD-10-CM | POA: Diagnosis not present

## 2019-01-03 DIAGNOSIS — M5137 Other intervertebral disc degeneration, lumbosacral region: Secondary | ICD-10-CM | POA: Diagnosis not present

## 2019-01-03 DIAGNOSIS — R300 Dysuria: Secondary | ICD-10-CM | POA: Diagnosis not present

## 2019-01-03 DIAGNOSIS — N390 Urinary tract infection, site not specified: Secondary | ICD-10-CM | POA: Diagnosis not present

## 2019-01-03 DIAGNOSIS — E039 Hypothyroidism, unspecified: Secondary | ICD-10-CM | POA: Diagnosis not present

## 2019-01-03 DIAGNOSIS — I10 Essential (primary) hypertension: Secondary | ICD-10-CM | POA: Diagnosis not present

## 2019-01-04 DIAGNOSIS — K5903 Drug induced constipation: Secondary | ICD-10-CM | POA: Diagnosis not present

## 2019-01-04 DIAGNOSIS — Z433 Encounter for attention to colostomy: Secondary | ICD-10-CM | POA: Diagnosis not present

## 2019-01-04 DIAGNOSIS — Z4801 Encounter for change or removal of surgical wound dressing: Secondary | ICD-10-CM | POA: Diagnosis not present

## 2019-01-04 DIAGNOSIS — G629 Polyneuropathy, unspecified: Secondary | ICD-10-CM | POA: Diagnosis not present

## 2019-01-04 DIAGNOSIS — I129 Hypertensive chronic kidney disease with stage 1 through stage 4 chronic kidney disease, or unspecified chronic kidney disease: Secondary | ICD-10-CM | POA: Diagnosis not present

## 2019-01-04 DIAGNOSIS — J449 Chronic obstructive pulmonary disease, unspecified: Secondary | ICD-10-CM | POA: Diagnosis not present

## 2019-01-04 DIAGNOSIS — Z48815 Encounter for surgical aftercare following surgery on the digestive system: Secondary | ICD-10-CM | POA: Diagnosis not present

## 2019-01-04 DIAGNOSIS — N183 Chronic kidney disease, stage 3 (moderate): Secondary | ICD-10-CM | POA: Diagnosis not present

## 2019-01-04 DIAGNOSIS — B20 Human immunodeficiency virus [HIV] disease: Secondary | ICD-10-CM | POA: Diagnosis not present

## 2019-01-06 ENCOUNTER — Telehealth: Payer: Self-pay | Admitting: Emergency Medicine

## 2019-01-06 NOTE — Telephone Encounter (Signed)
[name], patients daughter in law called and stated [name] has not had a regular bm in 3 days. She stated that the patient did go to her pcp doctor butler and the provider placed her on magnesium for her constipation However, the patient still has not had regular bm's. After speaking to doctor bridges she stated for the patient to try d/c pain medication and zofran and to increase fruits, veggies, and to drink 64 oz of water. I notified rachel that the patient can try taking a probiotic to help with constipation and if the  patient starts vomiting or having severe abd pain to go to the er that doctor bridges is on call.  [name] stated she would and thanked me for the call.

## 2019-01-10 ENCOUNTER — Encounter: Payer: Self-pay | Admitting: Student

## 2019-01-10 ENCOUNTER — Ambulatory Visit (INDEPENDENT_AMBULATORY_CARE_PROVIDER_SITE_OTHER): Payer: Medicare HMO | Admitting: Student

## 2019-01-10 ENCOUNTER — Other Ambulatory Visit: Payer: Self-pay

## 2019-01-10 ENCOUNTER — Encounter: Payer: Self-pay | Admitting: General Surgery

## 2019-01-10 ENCOUNTER — Ambulatory Visit (INDEPENDENT_AMBULATORY_CARE_PROVIDER_SITE_OTHER): Payer: Self-pay | Admitting: General Surgery

## 2019-01-10 VITALS — BP 140/88 | HR 62 | Ht 64.0 in | Wt 180.8 lb

## 2019-01-10 VITALS — BP 145/69 | HR 81 | Temp 98.4°F | Resp 18 | Wt 180.0 lb

## 2019-01-10 DIAGNOSIS — I10 Essential (primary) hypertension: Secondary | ICD-10-CM | POA: Diagnosis not present

## 2019-01-10 DIAGNOSIS — I480 Paroxysmal atrial fibrillation: Secondary | ICD-10-CM

## 2019-01-10 DIAGNOSIS — N183 Chronic kidney disease, stage 3 unspecified: Secondary | ICD-10-CM

## 2019-01-10 DIAGNOSIS — K631 Perforation of intestine (nontraumatic): Secondary | ICD-10-CM

## 2019-01-10 DIAGNOSIS — K633 Ulcer of intestine: Secondary | ICD-10-CM | POA: Diagnosis not present

## 2019-01-10 NOTE — Progress Notes (Signed)
[facility] Note   HPI:  78 y.o. Female presents to clinic for follow-up evaluation of her wound. She is doing well. Still with some constipation but magnesium prescribed by Dr. Melina Copa helped. Eating well. Being treated for a UTI and URI per report.  Review of Systems:  Cough Wound healing No drainage or redness Colostomy with output  All other review of systems: otherwise negative   Vital Signs:  BP (!) 145/69 (BP Location: Left Arm, Patient Position: Sitting, Cuff Size: Normal)   Pulse 81   Temp 98.4 F (36.9 C) (Oral)   Resp 18   Wt 180 lb (81.6 kg)   BMI 30.90 kg/m    Physical Exam:  Physical Exam Vitals signs reviewed.  Cardiovascular:     Rate and Rhythm: Normal rate.  Pulmonary:     Effort: Pulmonary effort is normal.  Abdominal:     General: There is no distension.     Palpations: Abdomen is soft.     Tenderness: There is no abdominal tenderness.     Comments: Midline healing, small superficial area inferiorly with good granulation, colostomy bag in place, no signs of hernia  Neurological:     Mental Status: She is alert.      Assessment:  78 y.o. yo Female s/p partial colectomy end colostomy for a large bowel perforation related to stercoral ulcer. Doing well. Healing.   Plan:  Continue wound care. Will see you a few weeks after the coronavirus scare is subsiding. Plan for repeat colonoscopy and barium enema (dye through your bottom) prior to putting you back together. Will see in about 6 weeks and can determine planning at that time.    Future Appointments  Date Time Provider [place]  02/21/2019  9:15 AM Virl Cagey, MD [facility] None    All of the above recommendations were discussed with the patient and patient's family, and all of patient's and family's questions were answered to their expressed satisfaction.  Curlene Labrum, MD [facility] [address] [place], [place]  [address] [phone] (office)

## 2019-01-10 NOTE — Patient Instructions (Addendum)
Continue wound care. Will see you a few weeks after the coronavirus scare is subsiding. Plan for repeat colonoscopy and barium enema (dye through your bottom) prior to putting you back together.

## 2019-01-10 NOTE — Progress Notes (Signed)
Cardiology Office Note    Date:  01/10/2019   ID:  [name], DOB [birth year], MRN [id number]  PCP:  Octavio Graves, DO  Cardiologist: Carlyle Dolly, MD    Chief Complaint  Patient presents with   Hospitalization Follow-up    History of Present Illness:    [name] is a 78 y.o. female with past medical history of HTN, COPD, Stage 3 CKD, and HIV who presents to the office today for hospital follow-up.   She was most recently admitted to [facility] in 10/2018 for evaluation of lower abdominal pain with associated nausea and vomiting. CT Abdomen showed free air throughout the abdomen consistent with bowel perforation and cystitis. She was started on Cefepime and Flagyl at the time of admission and was taken to the OR and found to have a stercoral ulcer with perforation and fecal peritonitis. She underwent partial colectomy and placement of a colostomy by Dr. Constance Haw. Cardiology was consulted during admission as she was found to be in atrial fibrillation with RVR. She initially required IV Cardizem and converted back to normal sinus rhythm within 5 hours. She was continued on PTA Bystolic and Cardizem was transitioned to short acting Diltiazem 30 mg twice daily. Was not started on anticoagulation given her brief episode in the setting of her acute illness.   In talking with the patient and her caretaker today, she reports overall doing well from a cardiac perspective since her hospitalization. No recent chest pain or palpitations. Does have baseline dyspnea on exertion in the setting of COPD but denies any recent changes in her breathing. Was prescribed antibiotics by her PCP last week due to a possible acute exacerbation along with a UTI but says her symptoms have improved.   She did discontinue Diltiazem in the interim due to this causing worsening dizziness. Has remained on Bystolic 10mg  daily and says HR and BP have been well-controlled when checked at her office visits. Was  evaluated by Dr. Constance Haw earlier today and says she will likely need a repeat colonoscopy prior to proceeding with reversal of her colostomy.    Past Medical History:  Diagnosis Date   Anxiety    Arthritis    Bronchitis    CKD (chronic kidney disease) stage 3, GFR 30-59 ml/min (HCC) 05/04/2016   COPD (chronic obstructive pulmonary disease) (HCC)    Depression    Elevated LFTs 05/04/2016   History of TIAs 1981   left side weakness   HIV (human immunodeficiency virus infection) ([place])    Hypertension    Kidney disease related to HIV infection ([place])    pt states she has to have her creatinine level checked often    Nausea 03/03/2017   Peripheral neuropathy    bilateral feet   Sinusitis 05/06/2018   UTI (urinary tract infection) 03/03/2017   Weight loss 03/03/2017    Past Surgical History:  Procedure Laterality Date   ABDOMINAL HYSTERECTOMY  1985   BACK SURGERY     CATARACT EXTRACTION, BILATERAL     CHOLECYSTECTOMY     COLONOSCOPY  08/10/2002   NUR: Normal colonoscopy except for some hemorrhoids and some erythema at the dentate line   COLONOSCOPY  12/10/2006   Edwards:Diffuse colitis in the transverse and descending colon/This is not entirely typical of ischemic colitis or her HIV positive/ disease.  We need to be concerned about other causes   COLONOSCOPY  01/19/2011   RMR: Internal and external hemorrhoids, likely source of hematochezia anal papilla, otherwise normal  rectal mucosa/ Left-sided diverticula.  Cecal polyp with status post cold snare polypectomy.  Remainder of colonic mucosa appeared normal   COLOSTOMY N/A 11/12/2018   Procedure: COLOSTOMY;  Surgeon: Virl Cagey, MD;  Location: [facility];  Service: General;  Laterality: N/A;   ESOPHAGOGASTRODUODENOSCOPY  08/10/2002     NUR: Mild changes of reflux esophagitis limited to gastroesophageal junction  No evidence of ring, stricture, or esophageal candidiasis dysphagia/ Gastritis, possibly H. pylori  induced   ESOPHAGOGASTRODUODENOSCOPY  12/25/2002   NUR: Erosive antral gastritis.  The degree of gastritis is more significant than her last exam/ Normal examination of the esophagus/ Esophageal dilatation performed by passing 11 and [address] [place]   ESOPHAGOGASTRODUODENOSCOPY  11/13/2010   RMR: Circumferential distal esophageal erosions with soft peptic stricture, consistent with erosive reflux esophagitis or stricture  formation, status post dilation as described above/  Small hiatal hernia/ Tiny antral erosions, otherwise normal stomach D1 and D2   HEMORRHOID SURGERY  09/02/2012   Procedure: HEMORRHOIDECTOMY;  Surgeon: Jamesetta So, MD;  Location: [facility];  Service: General;  Laterality: N/A;   LAPAROTOMY N/A 11/12/2018   Procedure: EXPLORATORY LAPAROTOMY;  Surgeon: Virl Cagey, MD;  Location: [facility];  Service: General;  Laterality: N/A;   PARTIAL COLECTOMY N/A 11/12/2018   Procedure: PARTIAL COLECTOMY;  Surgeon: Virl Cagey, MD;  Location: [facility];  Service: General;  Laterality: N/A;   TUBAL LIGATION      Current Medications: Outpatient Medications Prior to Visit  Medication Sig Dispense Refill   albuterol (PROVENTIL HFA;VENTOLIN HFA) 108 (90 Base) MCG/ACT inhaler Inhale 1-2 puffs into the lungs every 6 (six) hours as needed for wheezing or shortness of breath. 1 Inhaler 0   amitriptyline (ELAVIL) 50 MG tablet Take 50 mg by mouth at bedtime.      amoxicillin (AMOXIL) 500 MG capsule Take 500 mg by mouth 3 (three) times daily.     Cholecalciferol (VITAMIN D3) 10000 units TABS Take 1 tablet by mouth daily.     diphenhydrAMINE (BENADRYL) 25 mg capsule Take 25 mg by mouth daily as needed for itching (itching from hydrocodone).     docusate sodium (COLACE) 100 MG capsule Take 1 capsule (100 mg total) by mouth 2 (two) times daily. 10 capsule 0   fluticasone (FLONASE) 50 MCG/ACT nasal spray Place 2 sprays into both nostrils at bedtime. 16 g 8   furosemide (LASIX) 40  MG tablet Take 40 mg by mouth daily as needed for fluid.      guaiFENesin (MUCINEX) 600 MG 12 hr tablet Take 1 tablet (600 mg total) by mouth 2 (two) times daily. 30 tablet 0   ipratropium-albuterol (DUONEB) 0.5-2.5 (3) MG/3ML SOLN Inhale 3 mLs into the lungs every 6 (six) hours as needed (shortness of breath).      LORazepam (ATIVAN) 0.5 MG tablet Take 0.5 mg by mouth 2 (two) times daily.      magnesium 30 MG tablet Take 30 mg by mouth 2 (two) times daily.     Multiple Vitamin (ONE-A-DAY 55 PLUS PO) Take 1 tablet by mouth daily.     nebivolol (BYSTOLIC) 10 MG tablet Take 10 mg by mouth daily.      ofloxacin (FLOXIN) 300 MG tablet Take 300 mg by mouth 2 (two) times daily.     ondansetron (ZOFRAN) 8 MG tablet Take 8 mg by mouth daily as needed for nausea or vomiting.      ondansetron (ZOFRAN-ODT) 4 MG disintegrating tablet Take 1 tablet (4  mg total) by mouth every 6 (six) hours as needed for nausea. 20 tablet 0   phenazopyridine (PYRIDIUM) 200 MG tablet Take 1 tablet (200 mg total) by mouth 3 (three) times daily as needed for pain. 9 tablet 0   polyethylene glycol (MIRALAX / GLYCOLAX) packet Take 17 g by mouth daily. 14 each 0   polyvinyl alcohol (LIQUIFILM TEARS) 1.4 % ophthalmic solution Place 1 drop into both eyes as needed for dry eyes.     PREMARIN vaginal cream Place 1 Applicatorful vaginally at bedtime.      thiamine (VITAMIN B-1) 100 MG tablet Take 100 mg by mouth daily.     traMADol (ULTRAM) 50 MG tablet Take 1 tablet (50 mg total) by mouth every 6 (six) hours as needed for severe pain. 30 tablet 0   TRIUMEQ 600-50-300 MG tablet TAKE 1 TABLET ONCE DAILY. 30 tablet 1   No facility-administered medications prior to visit.      Allergies:   Bee venom; Promethazine; Tenofovir disoproxil fumarate [tenofovir disoproxil fumarate]; Compazine  [prochlorperazine edisylate]; Haloperidol lactate; Brompheniramine-acetaminophen; Chlorcyclizine; Chlorpromazine; Codeine; Navane  [thiothixene]; Other; Prednisone; Prochlorperazine; Promethazine hcl; Propoxyphene n-acetaminophen; and Morphine   Social History   Socioeconomic History   Marital status: Widowed    Spouse name: Not on file   Number of children: Not on file   Years of education: Not on file   Highest education level: Not on file  Occupational History   Occupation: retired    Fish farm manager: RETIRED  Scientist, product/process development strain: Not on file   Food insecurity:    Worry: Not on file    Inability: Not on file   Transportation needs:    Medical: Not on file    Non-medical: Not on file  Tobacco Use   Smoking status: Never Smoker   Smokeless tobacco: Never Used  Substance and Sexual Activity   Alcohol use: No   Drug use: No   Sexual activity: Not Currently    Comment: declined condoms  Lifestyle   Physical activity:    Days per week: Not on file    Minutes per session: Not on file   Stress: Not on file  Relationships   Social connections:    Talks on phone: Not on file    Gets together: Not on file    Attends religious service: Not on file    Active member of club or organization: Not on file    Attends meetings of clubs or organizations: Not on file    Relationship status: Not on file  Other Topics Concern   Not on file  Social History Narrative   Not on file     Family History:  The patient's family history includes Colon cancer in her brother; Diabetes in her brother and mother.   Review of Systems:   Please see the history of present illness.     General:  No chills, fever, night sweats or weight changes.  Cardiovascular:  No chest pain, dyspnea on exertion, edema, orthopnea, palpitations, paroxysmal nocturnal dyspnea. Dermatological: No rash, lesions/masses Respiratory: Positive for cough and dyspnea. Urologic: No hematuria, dysuria Abdominal:   No nausea, vomiting, diarrhea, bright red blood per rectum, melena, or hematemesis Neurologic:  No visual  changes, wkns, changes in mental status.  All other systems reviewed and are otherwise negative except as noted above.   Physical Exam:    VS:  BP 140/88    Pulse 62    Ht 5\' 4"  (1.626 m)  Wt 180 lb 12.8 oz (82 kg)    SpO2 94%    BMI 31.03 kg/m    General: Well developed, elderly Caucasian female appearing in no acute distress. Head: Normocephalic, atraumatic, sclera non-icteric, no xanthomas, nares are without discharge.  Neck: No carotid bruits. JVD not elevated.  Lungs: Respirations regular and unlabored, without wheezes or rales.  Heart: Regular rate and rhythm. No S3 or S4.  No murmur, no rubs, or gallops appreciated. Abdomen: Soft, non-tender, non-distended with normoactive bowel sounds. No hepatomegaly. No rebound/guarding. No obvious abdominal masses. Msk:  Strength and tone appear normal for age. No joint deformities or effusions. Extremities: No clubbing or cyanosis. No lower extremity edema.  Distal pedal pulses are 2+ bilaterally. Neuro: Alert and oriented X 3. Moves all extremities spontaneously. No focal deficits noted. Psych:  Responds to questions appropriately with a normal affect. Skin: No rashes or lesions noted  Wt Readings from Last 3 Encounters:  01/10/19 180 lb 12.8 oz (82 kg)  01/10/19 180 lb (81.6 kg)  12/13/18 184 lb (83.5 kg)     Studies/Labs Reviewed:   EKG:  EKG is not ordered today.    Recent Labs: 11/16/2018: TSH 4.603 11/18/2018: ALT 16 11/19/2018: Magnesium 2.1 11/20/2018: BUN 8; Creatinine, Ser 1.13; Potassium 4.6; Sodium 139 11/21/2018: Hemoglobin 10.4; Platelets 406   Lipid Panel    Component Value Date/Time   CHOL 203 (H) 10/31/2018 1141   TRIG 576 (H) 10/31/2018 1141   HDL 26 (L) 10/31/2018 1141   CHOLHDL 7.8 (H) 10/31/2018 1141   VLDL NOT CALC 11/13/2015 1108   [place]  10/31/2018 1141     Comment:     . LDL cholesterol not calculated. Triglyceride levels greater than 400 mg/dL invalidate calculated LDL results. . Reference  range: <100 . Desirable range <100 mg/dL for primary prevention;   <70 mg/dL for patients with CHD or diabetic patients  with > or = 2 CHD risk factors. Marland Kitchen LDL-C is now calculated using the Martin-Hopkins  calculation, which is a validated novel method providing  better accuracy than the Friedewald equation in the  estimation of LDL-C.  Cresenciano Genre et al. Annamaria Helling. 1610;960(45): 2061-2068  (http://education.Quest[employer].com/faq/FAQ164)    LDLDIRECT 190 (H) 12/14/2012 1610    Additional studies/ records that were reviewed today include:   Echocardiogram: 10/2018 Study Conclusions  - Left ventricle: The cavity size was normal. Wall thickness was   increased in a pattern of moderate LVH. Systolic function was   normal. The estimated ejection fraction was in the range of 60%   to 65%. Wall motion was normal; there were no regional wall   motion abnormalities. Doppler parameters are consistent with   abnormal left ventricular relaxation (grade 1 diastolic   dysfunction). - Aortic valve: Mildly calcified annulus. Trileaflet; mildly   thickened leaflets. Valve area (VTI): 2.67 cm^2. Valve area   (Vmax): 2.36 cm^2. - Mitral valve: Mildly calcified annulus. Mildly thickened leaflets  Assessment:    1. PAF (paroxysmal atrial fibrillation) ([place])   2. Essential hypertension   3. CKD (chronic kidney disease) stage 3, GFR 30-59 ml/min (HCC)   4. Stercoral ulcer of large intestine      Plan:   In order of problems listed above:  1. Paroxysmal Atrial Fibrillation - Was a new diagnosis for the patient during her recent admission for septic shock in the setting of stercoral ulcer with perforation and fecal peritonitis. She was in the arrhythmia for approximately 5 hours and converted back to normal sinus  rhythm. She was symptomatic during her episode and denies any recurrent palpitations since hospital discharge. Maintaining NSR by examination today.  - She was not started on  anticoagulation due to her brief episode but I did review with the patient today that if she has a recurrent event, would need to consider the initiation of anticoagulation.   - Remains on Bystolic 10 mg daily. No longer taking Cardizem   2. HTN - BP is at 140/88 during today's visit which is much improved as compared to her readings during admission. She remains on Bystolic 10 mg daily. Was intolerant to Cardizem and Hydralazine following hospital discharge. Bystolic could be further titrated to 20 mg daily if additional BP control is needed in the future.  3. Stage 3 CKD - baseline creatinine 1.4 - 1.6. Peaked at 2.09 during recent admission, improved to 1.13 at the time of discharge. She is only taking Lasix as needed and has not utilized this in several weeks.   4. Stercoral Ulcer of Large Intestine - recently admitted in 10/2018 and found to have a stercoral ulcer with perforation and fecal peritonitis requiring partial colectomy and placement of a colostomy by Dr. Constance Haw. - per the patient, she will likely require a repeat colonoscopy prior to proceeding with reversal of her colostomy in several months.  - from a cardiac perspective, she would not require further cardiac testing prior to proceeding with her upcoming colonoscopy. She is not on anticoagulation so no cardiac medications should need to be held.  I did recommend that she have cardiac telemetry at the time of her surgery to monitor for any recurrent arrhythmias.    Medication Adjustments/Labs and Tests Ordered: Current medicines are reviewed at length with the patient today.  Concerns regarding medicines are outlined above.  Medication changes, Labs and Tests ordered today are listed in the Patient Instructions below. Patient Instructions  Medication Instructions:  Your physician recommends that you continue on your current medications as directed. Please refer to the Current Medication list given to you today.  If you need a  refill on your cardiac medications before your next appointment, please call your pharmacy.   Lab work: NONE   If you have labs (blood work) drawn today and your tests are completely normal, you will receive your results only by:  [place] (if you have MyChart) OR  A paper copy in the mail If you have any lab test that is abnormal or we need to change your treatment, we will call you to review the results.  Testing/Procedures: NONE   Follow-Up: At [facility], you and your health needs are our priority.  As part of our continuing mission to provide you with exceptional heart care, we have created designated Provider Care Teams.  These Care Teams include your primary Cardiologist (physician) and Advanced Practice Providers (APPs -  Physician Assistants and Nurse Practitioners) who all work together to provide you with the care you need, when you need it. You will need a follow up appointment in 6 months.  Please call our office 2 months in advance to schedule this appointment.  You may see No primary care provider on file. or one of the following Advanced Practice Providers on your designated Care Team:   [place], PA-C ([place])  Ermalinda Barrios, PA-C [facility])  Any Other Special Instructions Will Be Listed Below (If Applicable). Thank you for choosing [facility]!    Signed, Erma Heritage, PA-C  01/10/2019 2:42 PM  [facility] [address] [place], [place] [address] Phone: [phone] Fax: [phone]

## 2019-01-10 NOTE — Patient Instructions (Signed)
Medication Instructions:  Your physician recommends that you continue on your current medications as directed. Please refer to the Current Medication list given to you today.  If you need a refill on your cardiac medications before your next appointment, please call your pharmacy.   Lab work: NONE   If you have labs (blood work) drawn today and your tests are completely normal, you will receive your results only by: Marland Kitchen MyChart Message (if you have MyChart) OR . A paper copy in the mail If you have any lab test that is abnormal or we need to change your treatment, we will call you to review the results.  Testing/Procedures: NONE   Follow-Up: At [facility], you and your health needs are our priority.  As part of our continuing mission to provide you with exceptional heart care, we have created designated Provider Care Teams.  These Care Teams include your primary Cardiologist (physician) and Advanced Practice Providers (APPs -  Physician Assistants and Nurse Practitioners) who all work together to provide you with the care you need, when you need it. You will need a follow up appointment in 6 months.  Please call our office 2 months in advance to schedule this appointment.  You may see No primary care provider on file. or one of the following Advanced Practice Providers on your designated Care Team:   [place], PA-C [facility]) . Ermalinda Barrios, PA-C ([place])  Any Other Special Instructions Will Be Listed Below (If Applicable). Thank you for choosing [facility]!

## 2019-01-11 DIAGNOSIS — R05 Cough: Secondary | ICD-10-CM | POA: Diagnosis not present

## 2019-01-11 DIAGNOSIS — G629 Polyneuropathy, unspecified: Secondary | ICD-10-CM | POA: Diagnosis not present

## 2019-01-11 DIAGNOSIS — Z48815 Encounter for surgical aftercare following surgery on the digestive system: Secondary | ICD-10-CM | POA: Diagnosis not present

## 2019-01-11 DIAGNOSIS — I10 Essential (primary) hypertension: Secondary | ICD-10-CM | POA: Diagnosis not present

## 2019-01-11 DIAGNOSIS — E039 Hypothyroidism, unspecified: Secondary | ICD-10-CM | POA: Diagnosis not present

## 2019-01-11 DIAGNOSIS — Z433 Encounter for attention to colostomy: Secondary | ICD-10-CM | POA: Diagnosis not present

## 2019-01-11 DIAGNOSIS — K5903 Drug induced constipation: Secondary | ICD-10-CM | POA: Diagnosis not present

## 2019-01-11 DIAGNOSIS — G8929 Other chronic pain: Secondary | ICD-10-CM | POA: Diagnosis not present

## 2019-01-11 DIAGNOSIS — I129 Hypertensive chronic kidney disease with stage 1 through stage 4 chronic kidney disease, or unspecified chronic kidney disease: Secondary | ICD-10-CM | POA: Diagnosis not present

## 2019-01-11 DIAGNOSIS — Z4801 Encounter for change or removal of surgical wound dressing: Secondary | ICD-10-CM | POA: Diagnosis not present

## 2019-01-11 DIAGNOSIS — B20 Human immunodeficiency virus [HIV] disease: Secondary | ICD-10-CM | POA: Diagnosis not present

## 2019-01-11 DIAGNOSIS — Z933 Colostomy status: Secondary | ICD-10-CM | POA: Diagnosis not present

## 2019-01-11 DIAGNOSIS — E782 Mixed hyperlipidemia: Secondary | ICD-10-CM | POA: Diagnosis not present

## 2019-01-11 DIAGNOSIS — J40 Bronchitis, not specified as acute or chronic: Secondary | ICD-10-CM | POA: Diagnosis not present

## 2019-01-11 DIAGNOSIS — N183 Chronic kidney disease, stage 3 (moderate): Secondary | ICD-10-CM | POA: Diagnosis not present

## 2019-01-11 DIAGNOSIS — J449 Chronic obstructive pulmonary disease, unspecified: Secondary | ICD-10-CM | POA: Diagnosis not present

## 2019-01-17 DIAGNOSIS — G629 Polyneuropathy, unspecified: Secondary | ICD-10-CM | POA: Diagnosis not present

## 2019-01-17 DIAGNOSIS — T887XXA Unspecified adverse effect of drug or medicament, initial encounter: Secondary | ICD-10-CM | POA: Diagnosis not present

## 2019-01-17 DIAGNOSIS — B20 Human immunodeficiency virus [HIV] disease: Secondary | ICD-10-CM | POA: Diagnosis not present

## 2019-01-17 DIAGNOSIS — K5903 Drug induced constipation: Secondary | ICD-10-CM | POA: Diagnosis not present

## 2019-01-17 DIAGNOSIS — Z48815 Encounter for surgical aftercare following surgery on the digestive system: Secondary | ICD-10-CM | POA: Diagnosis not present

## 2019-01-17 DIAGNOSIS — I129 Hypertensive chronic kidney disease with stage 1 through stage 4 chronic kidney disease, or unspecified chronic kidney disease: Secondary | ICD-10-CM | POA: Diagnosis not present

## 2019-01-17 DIAGNOSIS — Z433 Encounter for attention to colostomy: Secondary | ICD-10-CM | POA: Diagnosis not present

## 2019-01-17 DIAGNOSIS — N183 Chronic kidney disease, stage 3 (moderate): Secondary | ICD-10-CM | POA: Diagnosis not present

## 2019-01-17 DIAGNOSIS — Z4801 Encounter for change or removal of surgical wound dressing: Secondary | ICD-10-CM | POA: Diagnosis not present

## 2019-01-17 DIAGNOSIS — J449 Chronic obstructive pulmonary disease, unspecified: Secondary | ICD-10-CM | POA: Diagnosis not present

## 2019-01-18 DIAGNOSIS — N3000 Acute cystitis without hematuria: Secondary | ICD-10-CM | POA: Diagnosis not present

## 2019-01-18 DIAGNOSIS — Z09 Encounter for follow-up examination after completed treatment for conditions other than malignant neoplasm: Secondary | ICD-10-CM | POA: Diagnosis not present

## 2019-01-18 DIAGNOSIS — J449 Chronic obstructive pulmonary disease, unspecified: Secondary | ICD-10-CM | POA: Diagnosis not present

## 2019-01-18 DIAGNOSIS — N183 Chronic kidney disease, stage 3 (moderate): Secondary | ICD-10-CM | POA: Diagnosis not present

## 2019-01-18 DIAGNOSIS — I10 Essential (primary) hypertension: Secondary | ICD-10-CM | POA: Diagnosis not present

## 2019-01-18 DIAGNOSIS — B20 Human immunodeficiency virus [HIV] disease: Secondary | ICD-10-CM | POA: Diagnosis not present

## 2019-01-18 DIAGNOSIS — K631 Perforation of intestine (nontraumatic): Secondary | ICD-10-CM | POA: Diagnosis not present

## 2019-01-18 DIAGNOSIS — E039 Hypothyroidism, unspecified: Secondary | ICD-10-CM | POA: Diagnosis not present

## 2019-02-21 ENCOUNTER — Ambulatory Visit: Payer: Medicare HMO | Admitting: General Surgery

## 2019-02-27 ENCOUNTER — Other Ambulatory Visit: Payer: Self-pay | Admitting: Infectious Disease

## 2019-02-27 ENCOUNTER — Ambulatory Visit: Payer: Medicare HMO | Admitting: Infectious Disease

## 2019-02-27 DIAGNOSIS — B20 Human immunodeficiency virus [HIV] disease: Secondary | ICD-10-CM

## 2019-03-02 IMAGING — CT CT HEAD W/O CM
3 series · 15 of 45 positions shown, 18 images · non-contrast
Comparison: Head CT dated 05/31/2014 and MRI dated 08/21/2015

CLINICAL DATA: 76-year-old female with head trauma.

EXAM:
CT HEAD WITHOUT CONTRAST
TECHNIQUE: Contiguous axial images were obtained from the base of the skull
through the vertex without intravenous contrast.

[Series 2: head trauma wo · axial · 0.39mm/px · z∈[+1371,+1486]mm · 9 of 28 slices shown, 12 images]
[im 3/28  brain]
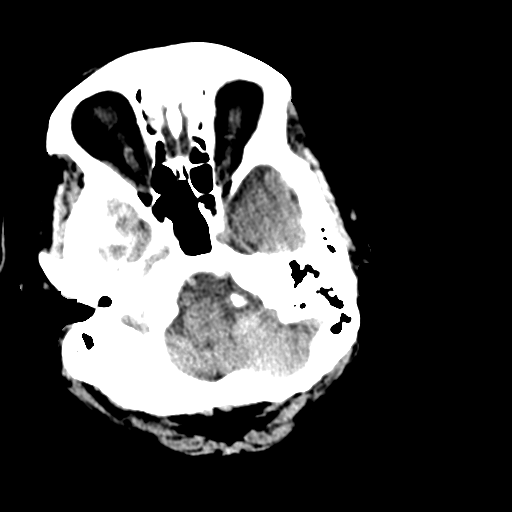
[im 3/28  bone]
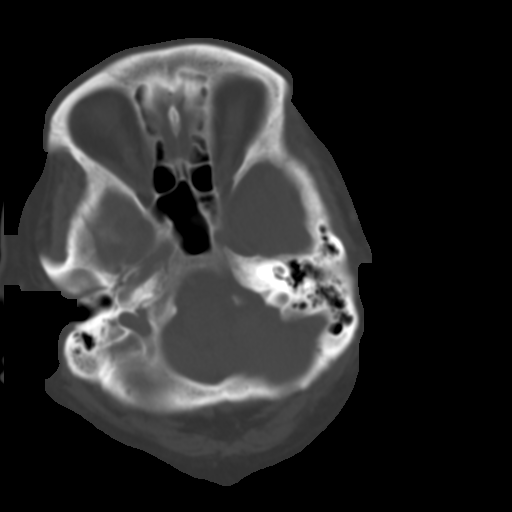
[im 6/28  brain]
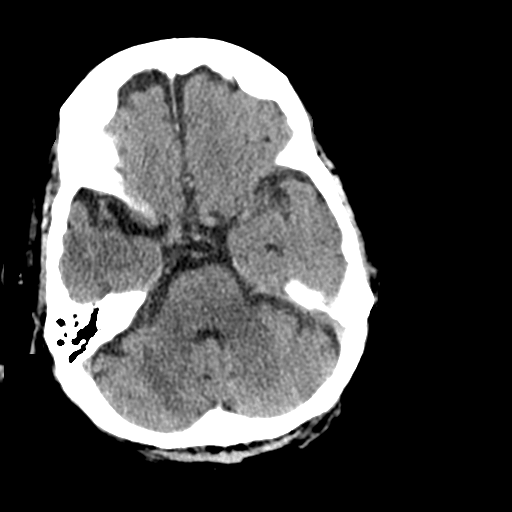
[im 9/28  brain]
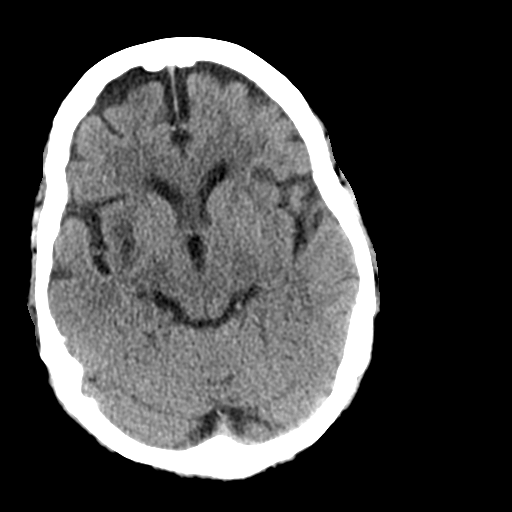
[im 12/28  brain]
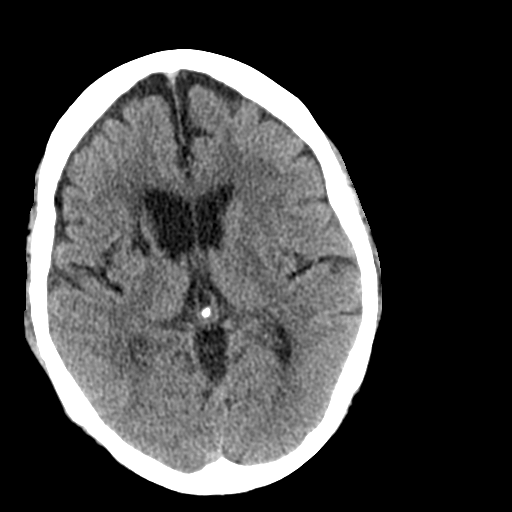
[im 15/28  brain]
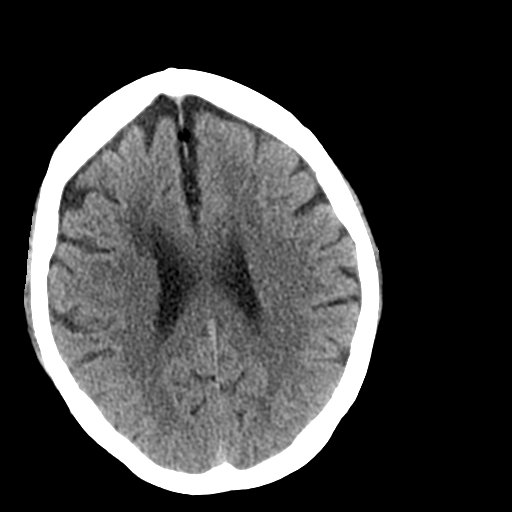
[im 15/28  bone]
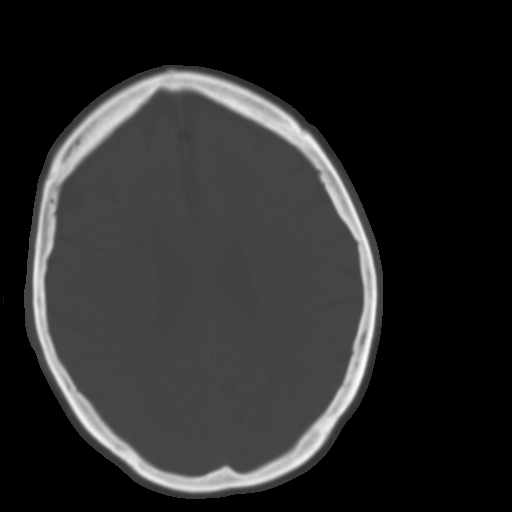
[im 17/28  brain]
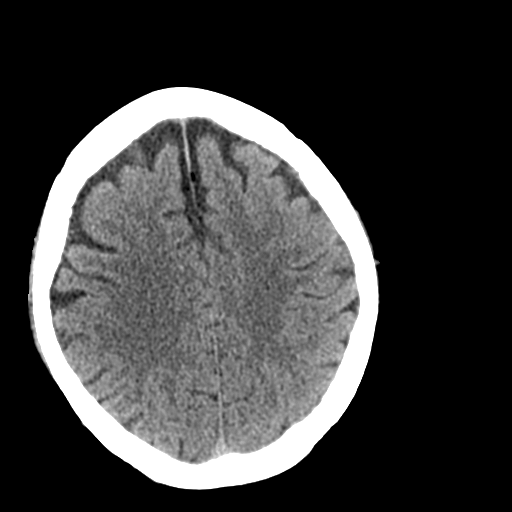
[im 20/28  brain]
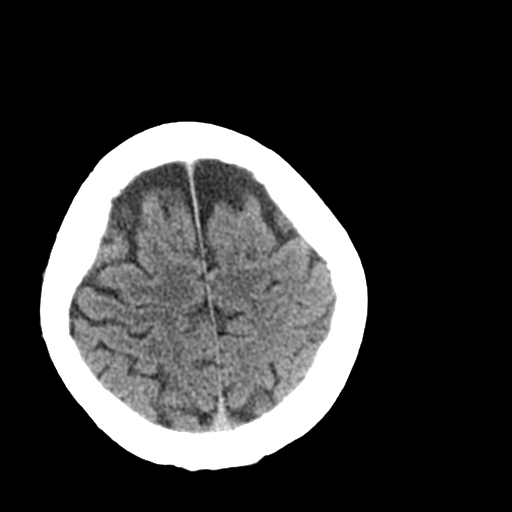
[im 23/28  brain]
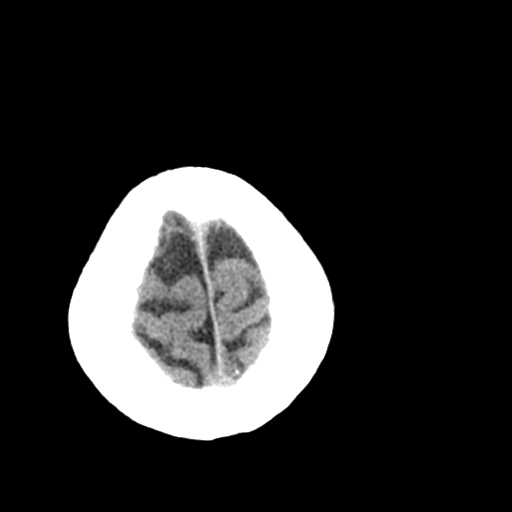
[im 26/28  brain]
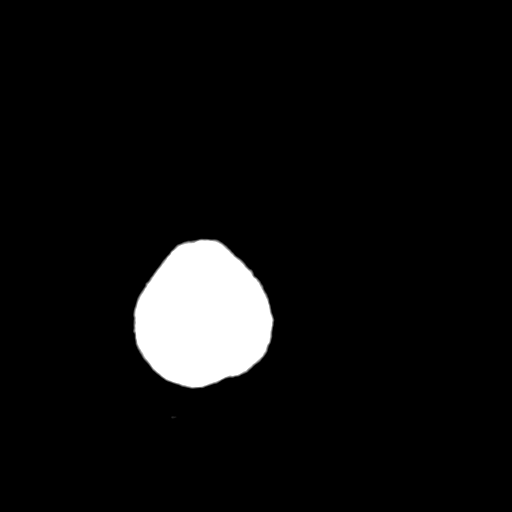
[im 26/28  bone]
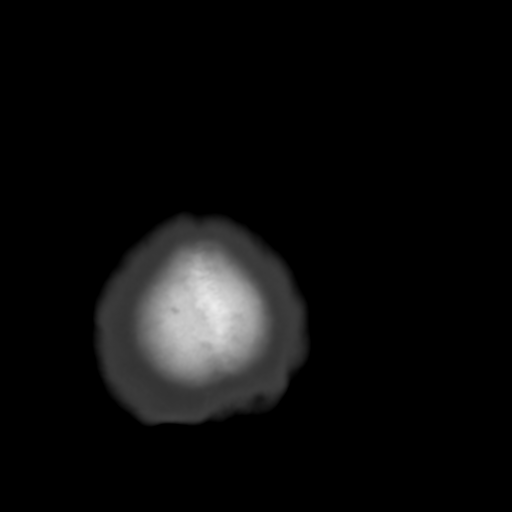

[Series 4: coronal soft tissue · coronal · 0.29mm/px · 3 of 62 slices shown]
[im 21/62  brain]
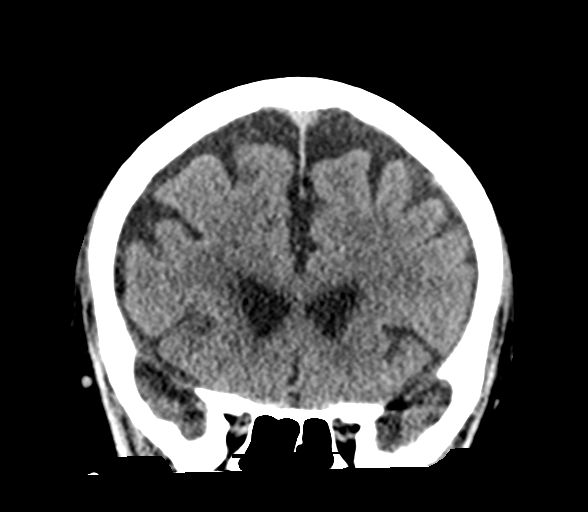
[im 28/62  brain]
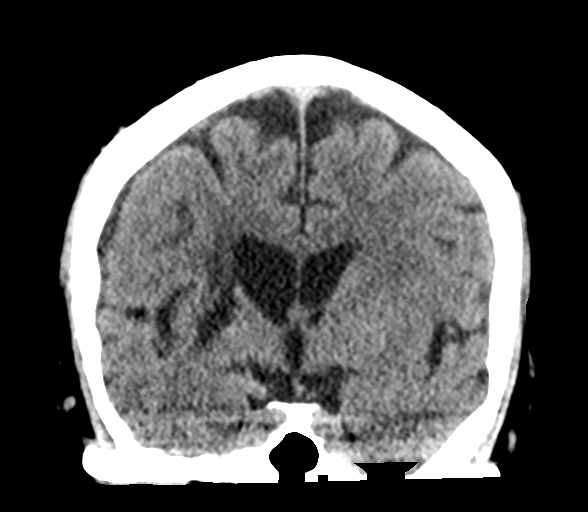
[im 34/62  brain]
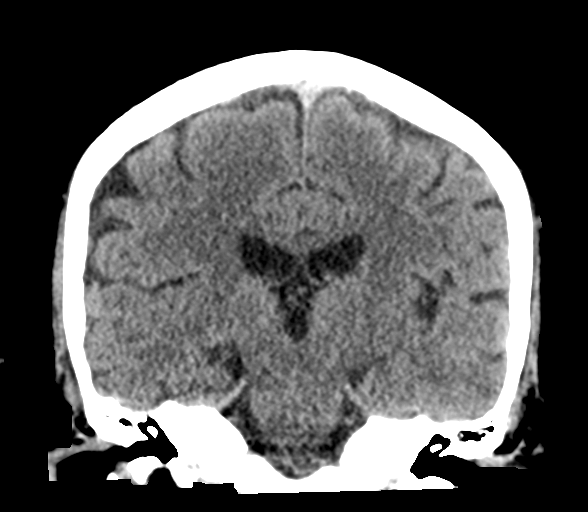

[Series 5: sagittal soft tissue · sagittal · 0.29mm/px · 3 of 51 slices shown]
[im 17/51  brain]
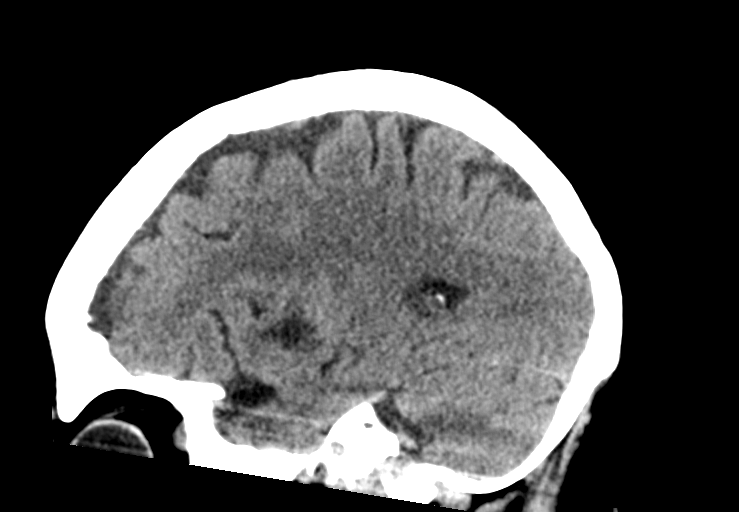
[im 26/51  brain]
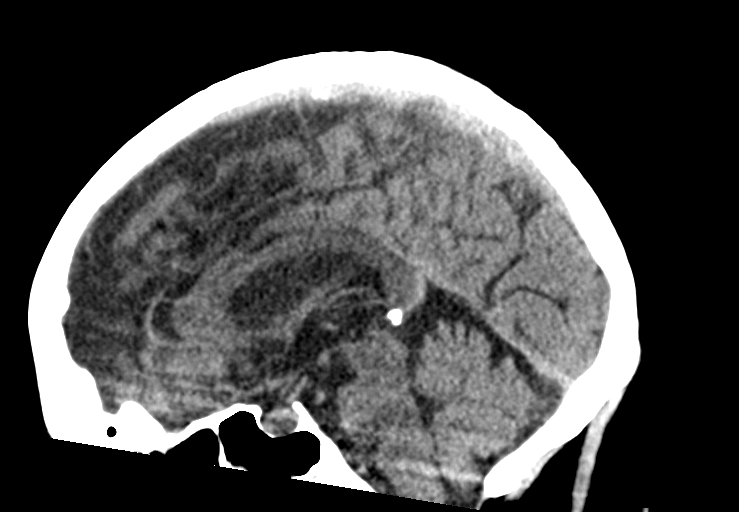
[im 34/51  brain]
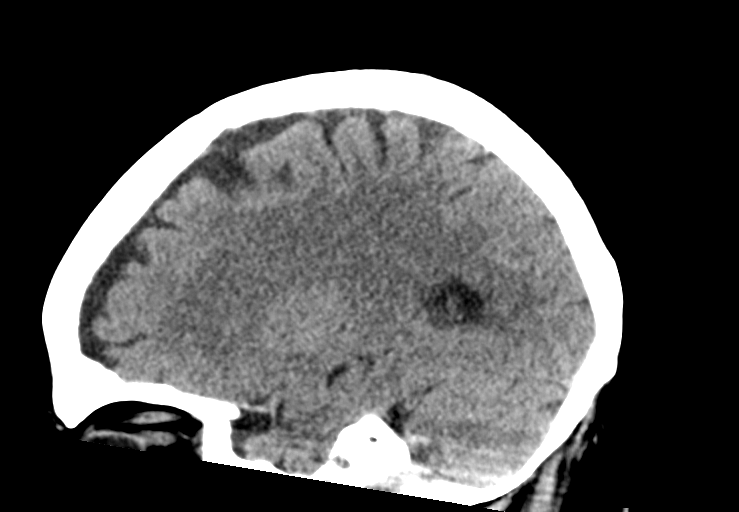

[15 of 45 positions shown; findings below may reference images not displayed]

FINDINGS: Brain: There is moderate age-related atrophy and chronic
microvascular ischemic changes. Old right basal ganglia infarct and
encephalomalacia as seen on the prior MRI. There is no acute
intracranial hemorrhage. No mass effect or midline shift. No
extra-axial fluid collection.

Vascular: No hyperdense vessel or unexpected calcification.

Skull: Normal. Negative for fracture or focal lesion.

Sinuses/Orbits: There is mild diffuse mucoperiosteal thickening of
paranasal sinuses with partial opacification of multiple ethmoid air
cells. No air-fluid levels. Remote right lamina papyracea fracture.
The mastoid air cells are clear.

Other: None
IMPRESSION: 1. No acute intracranial hemorrhage.
2. Moderate age-related atrophy and chronic microvascular ischemic
changes. Old right basal ganglia infarct and encephalomalacia.
3. Old right lamina Propecia fractures and mild paranasal sinus
disease.

## 2019-03-02 IMAGING — DX DG KNEE COMPLETE 4+V*R*
4 series · 4 of 4 positions shown · non-contrast
Comparison: None.

CLINICAL DATA: Fell at home

EXAM:
RIGHT KNEE - COMPLETE 4+ VIEW

[knee ap (1 of 3)]
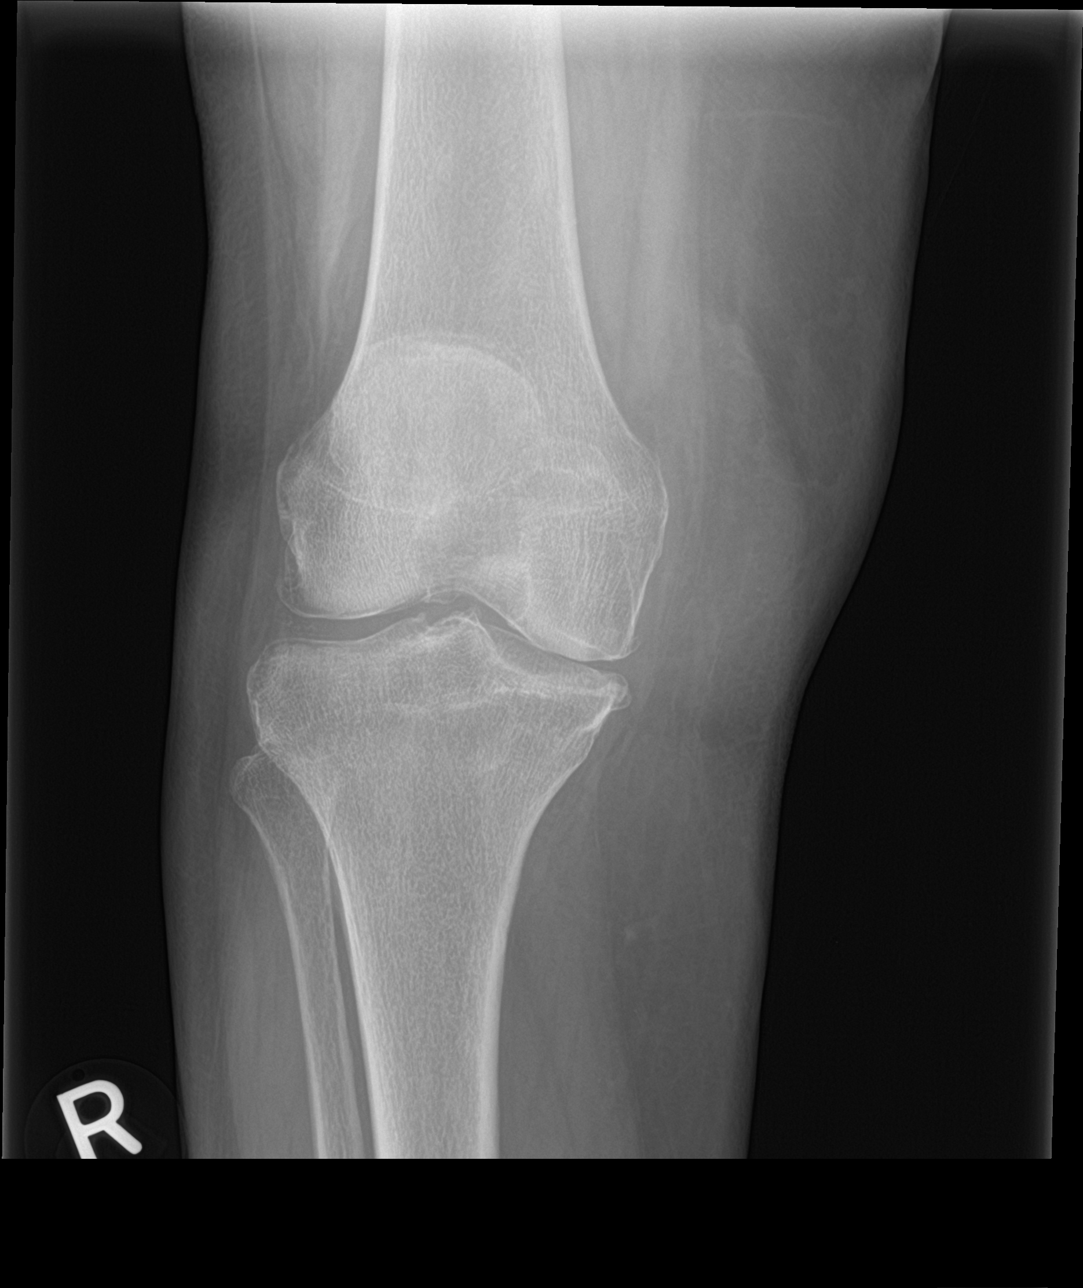

[knee lat]
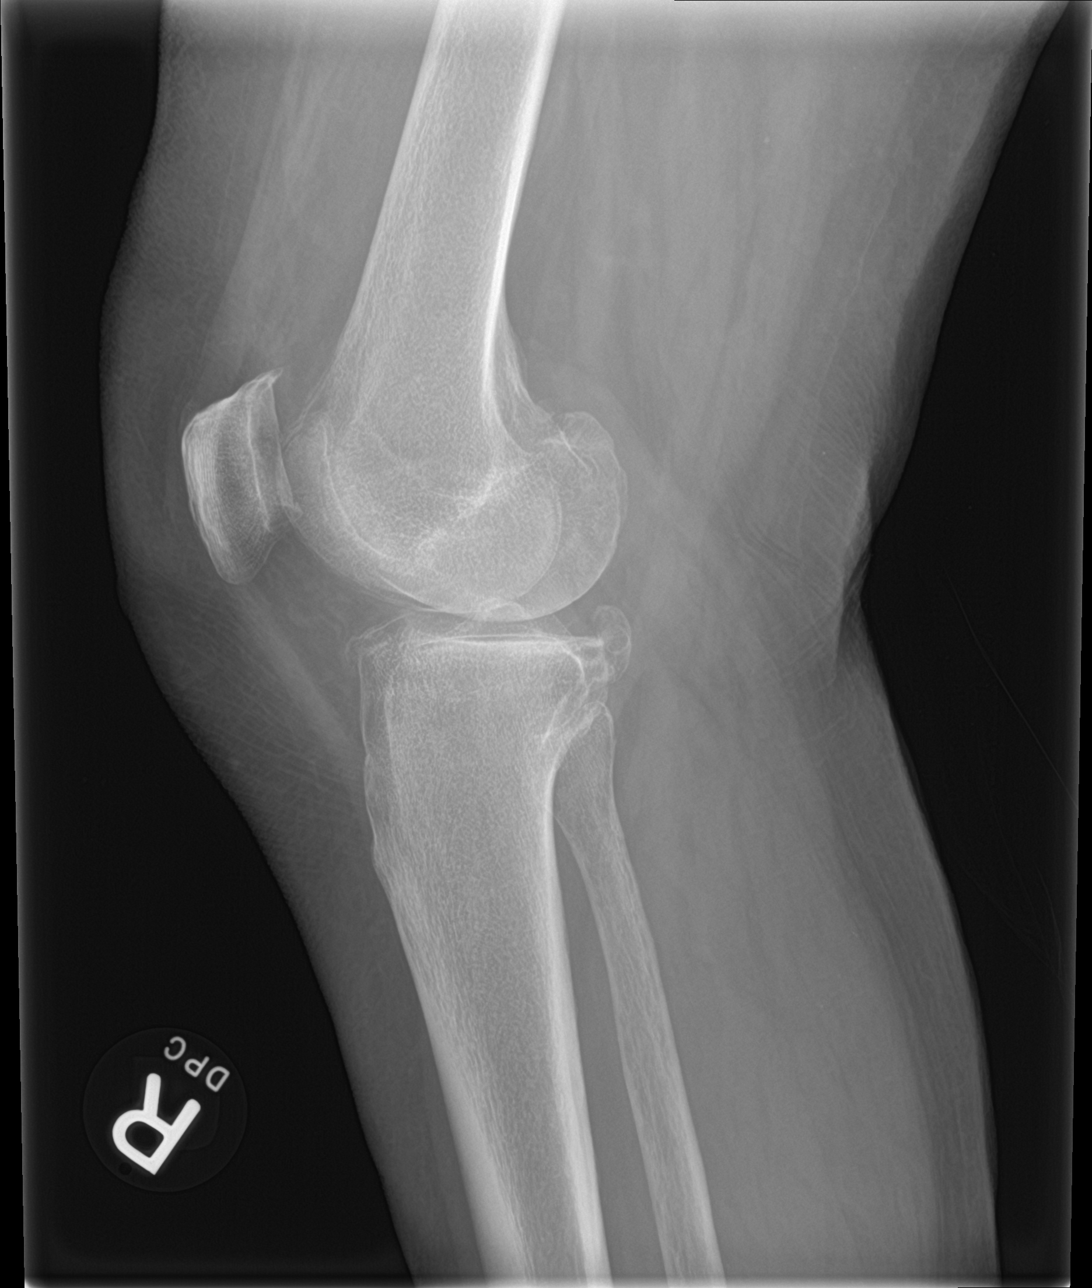

[knee ap (2 of 3)]
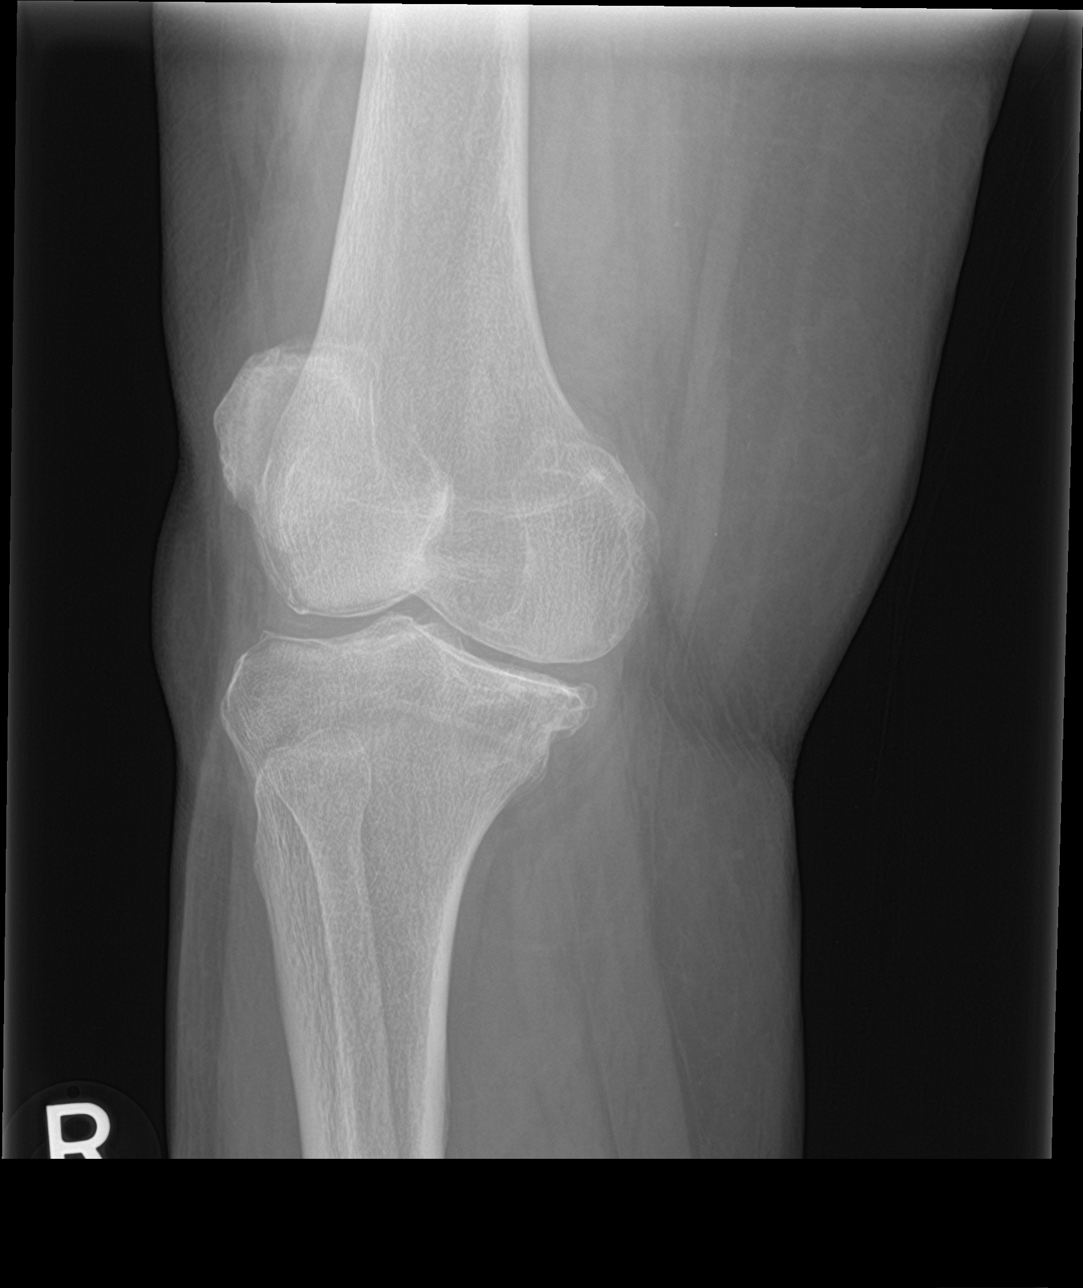

[knee ap (3 of 3)]
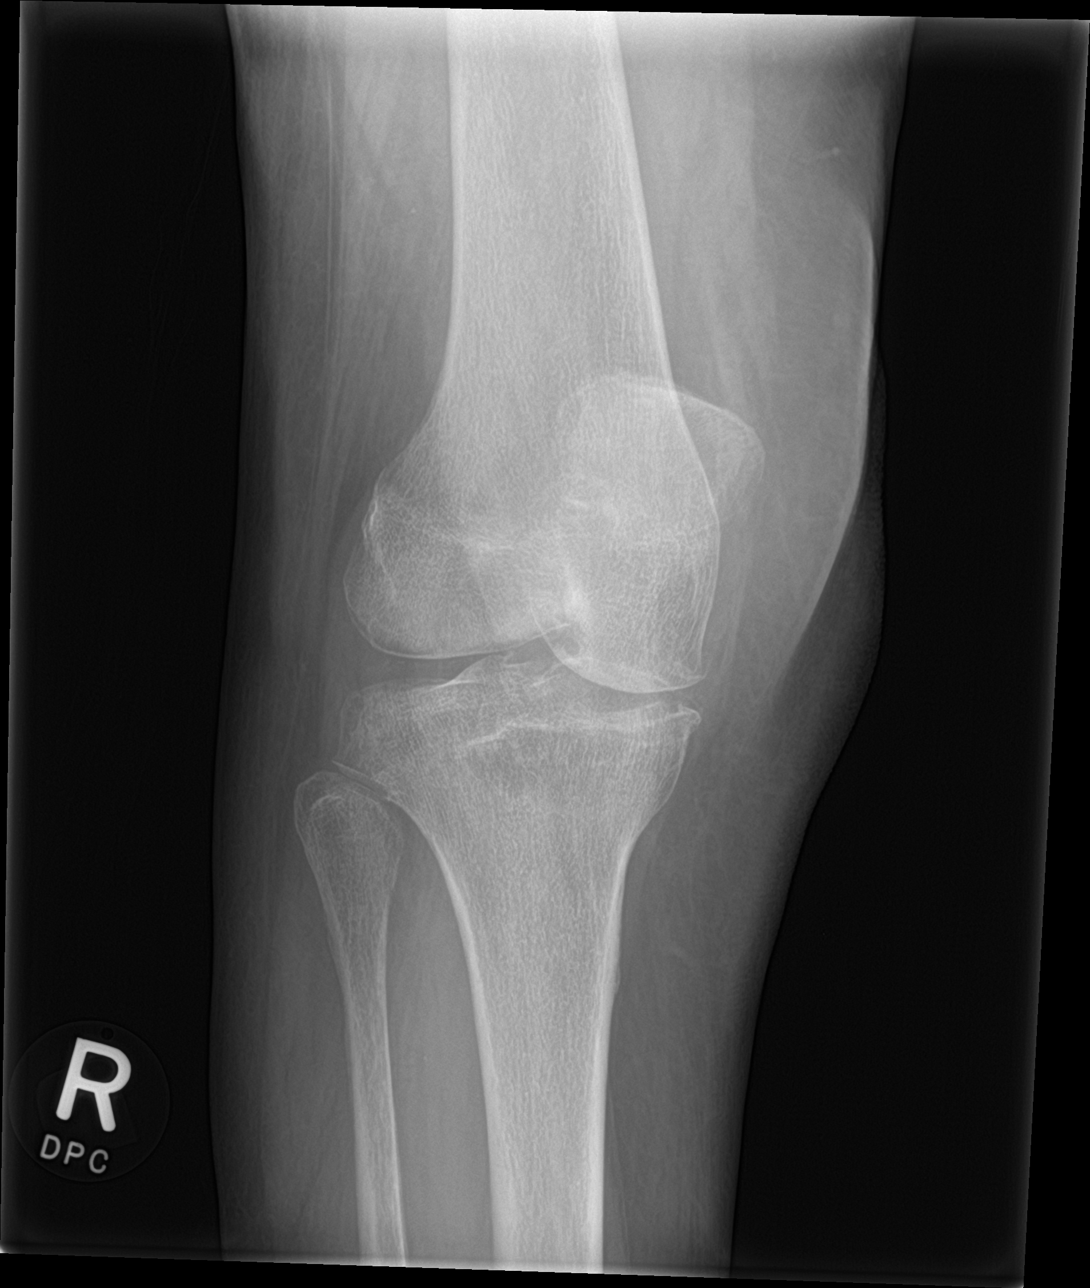

[4 of 4 positions shown; findings below may reference images not displayed]

FINDINGS: No acute displaced fracture or malalignment. Mild to moderate
degenerative changes of the medial and patellofemoral compartments.
No large effusion. Lateral joint space calcification
IMPRESSION: 1. No acute osseous abnormality
2. Degenerative changes
3. Chondrocalcinosis

## 2019-03-23 ENCOUNTER — Ambulatory Visit (INDEPENDENT_AMBULATORY_CARE_PROVIDER_SITE_OTHER): Payer: Medicare Other | Admitting: General Surgery

## 2019-03-23 ENCOUNTER — Encounter: Payer: Self-pay | Admitting: General Surgery

## 2019-03-23 ENCOUNTER — Other Ambulatory Visit: Payer: Self-pay

## 2019-03-23 DIAGNOSIS — K631 Perforation of intestine (nontraumatic): Secondary | ICD-10-CM

## 2019-03-23 NOTE — Progress Notes (Signed)
I connected with  [name] on 03/23/19 by telephone and verified that I am speaking with the correct person using two identifiers. Called her home [phone].     I discussed the limitations of evaluation and management by telemedicine. The patient expressed understanding and agreed to proceed.  [name] is a 78 yo s/p partial colectomy with end colostomy for sterocoral ulcer. She has been doing well and we discussed her options for reversal during our last clinic visit just before COVID.   Her last documented colonoscopy 2012 with Dr. Gala Romney.  She saw cardiology after our last visit and they recommend no cardiac work up prior to her colonoscopy or surgery per their notes and is not on anticoagulation after her brief episode of paroxysmal a fib.    She reports she is going well and the colostomy is going well.  She was having some problems with redness around the edges but she has improved but still a little irritated.   We discussed that she would like to get her colostomy reversed. We have discussed the need for colonoscopy and barium enema prior to her surgery. We have discussed the current COVID 19 pandemic and that we would want to start working towards reversal if that is the route she wants to go.  We will get referral out to Dr. Gala Romney for colonoscopy and we will get a barium enema scheduled. We have discussed that if she has more irritation of her skin that I can prescribe some powders/ or paste or we can refer her to the ostomy RNs in [facility].  If she has any questions or concerns, or [name] her daughter in law has any questions or concerns, they will call us.  Will get these studies done and will touch base after to determine timing for colostomy reversal.   I spent 15 minutes on the phone with the patient discussing the options and the plan for reversal of her colostomy. We also discussed the risk of surgery including but not limited to bleeding, infection, leak, injury  to other organs, injury to ureter.   Curlene Labrum, MD [facility] [address] [place], [place] [address] [phone] (office)

## 2019-03-27 ENCOUNTER — Other Ambulatory Visit: Payer: Self-pay

## 2019-03-27 ENCOUNTER — Other Ambulatory Visit (HOSPITAL_COMMUNITY)
Admission: RE | Admit: 2019-03-27 | Discharge: 2019-03-27 | Disposition: A | Discharge status: Home / Self Care | Payer: Medicare Other | Source: Ambulatory Visit | Attending: Infectious Disease | Admitting: Infectious Disease

## 2019-03-27 ENCOUNTER — Ambulatory Visit (INDEPENDENT_AMBULATORY_CARE_PROVIDER_SITE_OTHER): Payer: Medicare Other | Admitting: Infectious Disease

## 2019-03-27 ENCOUNTER — Encounter: Payer: Self-pay | Admitting: Infectious Disease

## 2019-03-27 VITALS — BP 166/87 | HR 62 | Temp 98.6°F | Wt 190.0 lb

## 2019-03-27 DIAGNOSIS — B20 Human immunodeficiency virus [HIV] disease: Secondary | ICD-10-CM | POA: Insufficient documentation

## 2019-03-27 DIAGNOSIS — A539 Syphilis, unspecified: Secondary | ICD-10-CM

## 2019-03-27 DIAGNOSIS — Z113 Encounter for screening for infections with a predominantly sexual mode of transmission: Secondary | ICD-10-CM | POA: Diagnosis present

## 2019-03-27 DIAGNOSIS — G909 Disorder of the autonomic nervous system, unspecified: Secondary | ICD-10-CM | POA: Diagnosis present

## 2019-03-27 DIAGNOSIS — R300 Dysuria: Secondary | ICD-10-CM

## 2019-03-27 DIAGNOSIS — K631 Perforation of intestine (nontraumatic): Secondary | ICD-10-CM

## 2019-03-27 DIAGNOSIS — Z79899 Other long term (current) drug therapy: Secondary | ICD-10-CM | POA: Insufficient documentation

## 2019-03-27 DIAGNOSIS — J302 Other seasonal allergic rhinitis: Secondary | ICD-10-CM

## 2019-03-27 DIAGNOSIS — N898 Other specified noninflammatory disorders of vagina: Secondary | ICD-10-CM

## 2019-03-27 DIAGNOSIS — I10 Essential (primary) hypertension: Secondary | ICD-10-CM

## 2019-03-27 HISTORY — DX: Other seasonal allergic rhinitis: J30.2

## 2019-03-27 HISTORY — DX: Dysuria: R30.0

## 2019-03-27 MED ORDER — ABACAVIR-DOLUTEGRAVIR-LAMIVUD 600-50-300 MG PO TABS: 1.0000 | ORAL_TABLET | Freq: Every day | ORAL | 11 refills | Status: DC

## 2019-03-27 NOTE — Addendum Note (Signed)
Addended by: Lenore Cordia on: 03/27/2019 03:52 PM   Modules accepted: Orders

## 2019-03-27 NOTE — Progress Notes (Signed)
Subjective:   Chief complaints: She is complaining of sinus headaches as she did last year when I saw her also the end of the visit she complained of dysuria and itching  :  Patient ID: [name], female    DOB: 12-27-1940, 78 y.o.   MRN: [id number]  HPI  78 year-old Caucasian female with HIV currently well controlled on TRIUMEQ  Her viral load was undetectable and cd4 count healthy.  She has not physically seen me for more than 2 years if I am reading the chart correctly.  Earlier this year she suffered a large bowel perforation and had to undergo partial colectomy with end colostomy.  Re-anastomotic surgery was planned for April but has been delayed.  She is otherwise doing relatively well and being adherent to her Triumeq.  Her viral load was undetectable in January.  She does relate having bad headaches and sinus pain which she did last time I saw her a year ago.  There is no fever or other systemic symptoms.  She  She claims that she is largely staying at home and avoiding other people.  After the visit was done she asked my phlebotomist if she could have a urine analysis performed because she felt like "I am getting a urinary tract infection"  Her symptoms are dysuria and some vaginal itching.    Past Medical History:  Diagnosis Date  . Anxiety   . Arthritis   . Bronchitis   . CKD (chronic kidney disease) stage 3, GFR 30-59 ml/min (HCC) 05/04/2016  . COPD (chronic obstructive pulmonary disease) ([place])   . Depression   . Elevated LFTs 05/04/2016  . History of TIAs 1981   left side weakness  . HIV (human immunodeficiency virus infection) ([place])   . Hypertension   . Kidney disease related to HIV infection [facility])    pt states she has to have her creatinine level checked often   . Nausea 03/03/2017  . Peripheral neuropathy    bilateral feet  . Sinusitis 05/06/2018  . UTI (urinary tract infection) 03/03/2017  . Weight loss 03/03/2017    Past Surgical History:   Procedure Laterality Date  . ABDOMINAL HYSTERECTOMY  1985  . BACK SURGERY    . CATARACT EXTRACTION, BILATERAL    . CHOLECYSTECTOMY    . COLONOSCOPY  08/10/2002   NUR: Normal colonoscopy except for some hemorrhoids and some erythema at the dentate line  . COLONOSCOPY  12/10/2006   Edwards:Diffuse colitis in the transverse and descending colon/This is not entirely typical of ischemic colitis or her HIV positive/ disease.  We need to be concerned about other causes  . COLONOSCOPY  01/19/2011   RMR: Internal and external hemorrhoids, likely source of hematochezia anal papilla, otherwise normal rectal mucosa/ Left-sided diverticula.  Cecal polyp with status post cold snare polypectomy.  Remainder of colonic mucosa appeared normal  . COLOSTOMY N/A 11/12/2018   Procedure: COLOSTOMY;  Surgeon: Virl Cagey, MD;  Location: [facility];  Service: General;  Laterality: N/A;  . ESOPHAGOGASTRODUODENOSCOPY  08/10/2002     NUR: Mild changes of reflux esophagitis limited to gastroesophageal junction  No evidence of ring, stricture, or esophageal candidiasis dysphagia/ Gastritis, possibly H. pylori induced  . ESOPHAGOGASTRODUODENOSCOPY  12/25/2002   NUR: Erosive antral gastritis.  The degree of gastritis is more significant than her last exam/ Normal examination of the esophagus/ Esophageal dilatation performed by passing 85 and [address] [place]  . ESOPHAGOGASTRODUODENOSCOPY  11/13/2010   RMR: Circumferential distal esophageal erosions  with soft peptic stricture, consistent with erosive reflux esophagitis or stricture  formation, status post dilation as described above/  Small hiatal hernia/ Tiny antral erosions, otherwise normal stomach D1 and D2  . HEMORRHOID SURGERY  09/02/2012   Procedure: HEMORRHOIDECTOMY;  Surgeon: Jamesetta So, MD;  Location: [facility];  Service: General;  Laterality: N/A;  . LAPAROTOMY N/A 11/12/2018   Procedure: EXPLORATORY LAPAROTOMY;  Surgeon: Virl Cagey, MD;  Location: [facility];  Service: General;  Laterality: N/A;  . PARTIAL COLECTOMY N/A 11/12/2018   Procedure: PARTIAL COLECTOMY;  Surgeon: Virl Cagey, MD;  Location: [facility];  Service: General;  Laterality: N/A;  . TUBAL LIGATION      Family History  Problem Relation Age of Onset  . Diabetes Mother   . Diabetes Brother   . Colon cancer Brother       Social History   Socioeconomic History  . Marital status: Widowed    Spouse name: Not on file  . Number of children: Not on file  . Years of education: Not on file  . Highest education level: Not on file  Occupational History  . Occupation: retired    Fish farm manager: RETIRED  Social Needs  . Financial resource strain: Not on file  . Food insecurity:    Worry: Not on file    Inability: Not on file  . Transportation needs:    Medical: Not on file    Non-medical: Not on file  Tobacco Use  . Smoking status: Never Smoker  . Smokeless tobacco: Never Used  Substance and Sexual Activity  . Alcohol use: No  . Drug use: No  . Sexual activity: Not Currently    Comment: declined condoms  Lifestyle  . Physical activity:    Days per week: Not on file    Minutes per session: Not on file  . Stress: Not on file  Relationships  . Social connections:    Talks on phone: Not on file    Gets together: Not on file    Attends religious service: Not on file    Active member of club or organization: Not on file    Attends meetings of clubs or organizations: Not on file    Relationship status: Not on file  Other Topics Concern  . Not on file  Social History Narrative  . Not on file    Allergies  Allergen Reactions  . Bee Venom Anaphylaxis  . Promethazine Swelling  . Tenofovir Disoproxil Fumarate [Tenofovir Disoproxil Fumarate] Other (See Comments)    Renal failure, (08/27/2006)  . Compazine  [Prochlorperazine Edisylate] Nausea And Vomiting  . Haloperidol Lactate Other (See Comments)    Reaction is muscle tension, causes severe spasms in face and  neck. Forces eyes to roll in the back of the head  . Brompheniramine-Acetaminophen   . Chlorcyclizine   . Chlorpromazine   . Codeine Itching and Nausea And Vomiting  . Navane [Thiothixene] Other (See Comments) and Nausea And Vomiting    Same muscle spasm reaction as haldol  . Other     NOVACAINE  . Prednisone     Brief mild psychosis and agitation  . Prochlorperazine Nausea And Vomiting  . Promethazine Hcl Other (See Comments)    Same as haldol   . Propoxyphene N-Acetaminophen Itching and Nausea And Vomiting  . Morphine Itching and Swelling     Current Outpatient Medications:  .  albuterol (PROVENTIL HFA;VENTOLIN HFA) 108 (90 Base) MCG/ACT inhaler, Inhale 1-2 puffs into the lungs  every 6 (six) hours as needed for wheezing or shortness of breath., Disp: 1 Inhaler, Rfl: 0 .  amitriptyline (ELAVIL) 50 MG tablet, Take 50 mg by mouth at bedtime. , Disp: , Rfl:  .  amoxicillin (AMOXIL) 500 MG capsule, Take 500 mg by mouth 3 (three) times daily., Disp: , Rfl:  .  Cholecalciferol (VITAMIN D3) 10000 units TABS, Take 1 tablet by mouth daily., Disp: , Rfl:  .  diphenhydrAMINE (BENADRYL) 25 mg capsule, Take 25 mg by mouth daily as needed for itching (itching from hydrocodone)., Disp: , Rfl:  .  docusate sodium (COLACE) 100 MG capsule, Take 1 capsule (100 mg total) by mouth 2 (two) times daily., Disp: 10 capsule, Rfl: 0 .  fluticasone (FLONASE) 50 MCG/ACT nasal spray, Place 2 sprays into both nostrils at bedtime., Disp: 16 g, Rfl: 8 .  furosemide (LASIX) 40 MG tablet, Take 40 mg by mouth daily as needed for fluid. , Disp: , Rfl:  .  guaiFENesin (MUCINEX) 600 MG 12 hr tablet, Take 1 tablet (600 mg total) by mouth 2 (two) times daily., Disp: 30 tablet, Rfl: 0 .  ipratropium-albuterol (DUONEB) 0.5-2.5 (3) MG/3ML SOLN, Inhale 3 mLs into the lungs every 6 (six) hours as needed (shortness of breath). , Disp: , Rfl:  .  LORazepam (ATIVAN) 0.5 MG tablet, Take 0.5 mg by mouth 2 (two) times daily. , Disp: ,  Rfl:  .  magnesium 30 MG tablet, Take 30 mg by mouth 2 (two) times daily., Disp: , Rfl:  .  Multiple Vitamin (ONE-A-DAY 55 PLUS PO), Take 1 tablet by mouth daily., Disp: , Rfl:  .  nebivolol (BYSTOLIC) 10 MG tablet, Take 10 mg by mouth daily. , Disp: , Rfl:  .  ofloxacin (FLOXIN) 300 MG tablet, Take 300 mg by mouth 2 (two) times daily., Disp: , Rfl:  .  ondansetron (ZOFRAN) 8 MG tablet, Take 8 mg by mouth daily as needed for nausea or vomiting. , Disp: , Rfl:  .  ondansetron (ZOFRAN-ODT) 4 MG disintegrating tablet, Take 1 tablet (4 mg total) by mouth every 6 (six) hours as needed for nausea., Disp: 20 tablet, Rfl: 0 .  phenazopyridine (PYRIDIUM) 200 MG tablet, Take 1 tablet (200 mg total) by mouth 3 (three) times daily as needed for pain., Disp: 9 tablet, Rfl: 0 .  polyethylene glycol (MIRALAX / GLYCOLAX) packet, Take 17 g by mouth daily., Disp: 14 each, Rfl: 0 .  polyvinyl alcohol (LIQUIFILM TEARS) 1.4 % ophthalmic solution, Place 1 drop into both eyes as needed for dry eyes., Disp: , Rfl:  .  PREMARIN vaginal cream, Place 1 Applicatorful vaginally at bedtime. , Disp: , Rfl:  .  thiamine (VITAMIN B-1) 100 MG tablet, Take 100 mg by mouth daily., Disp: , Rfl:  .  traMADol (ULTRAM) 50 MG tablet, Take 1 tablet (50 mg total) by mouth every 6 (six) hours as needed for severe pain., Disp: 30 tablet, Rfl: 0 .  TRIUMEQ 600-50-300 MG tablet, TAKE 1 TABLET ONCE DAILY., Disp: 30 tablet, Rfl: 0  SReview of Systems  Constitutional: Negative for appetite change and diaphoresis.  HENT: Positive for postnasal drip, rhinorrhea, sinus pressure and sinus pain. Negative for congestion, sneezing and trouble swallowing.   Respiratory: Negative for chest tightness and stridor.   Cardiovascular: Negative for palpitations and leg swelling.  Gastrointestinal: Negative for abdominal distention, anal bleeding, blood in stool, constipation, diarrhea and nausea.  Genitourinary: Positive for dysuria. Negative for difficulty  urinating, flank pain, hematuria and urgency.  Musculoskeletal: Negative for gait problem and joint swelling.  Skin: Negative for color change, pallor and wound.  Neurological: Negative for tremors and light-headedness.  Hematological: Negative for adenopathy. Does not bruise/bleed easily.  Psychiatric/Behavioral: Negative for agitation, behavioral problems, confusion, decreased concentration, dysphoric mood, self-injury and sleep disturbance.       Objective:   Physical Exam  Constitutional: She is oriented to person, place, and time. She appears well-developed and well-nourished. No distress.  HENT:  Head: Normocephalic and atraumatic.  Nose: Mucosal edema present.  Mouth/Throat: No oropharyngeal exudate.  Eyes: Conjunctivae and EOM are normal. No scleral icterus.  Neck: Normal range of motion. Neck supple.  Cardiovascular: Normal rate and regular rhythm.  Pulmonary/Chest: Effort normal. No respiratory distress. She has no wheezes.  Abdominal: She exhibits no distension. There is no CVA tenderness.  Musculoskeletal:        General: No tenderness or edema.  Neurological: She is alert and oriented to person, place, and time. She exhibits normal muscle tone. Coordination normal.  Skin: Skin is warm and dry. No rash noted. She is not diaphoretic. No erythema. No pallor.  Psychiatric: She has a normal mood and affect. Her behavior is normal. Judgment and thought content normal.          Assessment & Plan:    HIV: Continue TRIUMEQ rechecking labs today and in 1 years time  Sinus headache: I suspect she has seasonal allergies and is advised use of Zyrtec  Dysuria and vaginal itching: This complaint came up after the visit I wonder if there is some kind of antibiotic seeking behavior as it seems interesting that she first mentions a sinus headache and then one side decide that is probably more likely a allergic sinusitis she proceeds to ask about urinary symptoms.  We will see what  cultures show in the UA shows and what kind of symptoms she still has.  CKD: relatively stable  Small bowel perforation: Status post surgery she is to have re- anastomosis sometime in the future.   I spent greater than 25 minutes with the patient including greater than 50% of time in face to face counsel of the patient and daughter with regards to shelter in place and means of avoiding novel coronavirus 2019 and in coordination of her  care.

## 2019-03-28 LAB — URINALYSIS
Bilirubin Urine: NEGATIVE
Glucose, UA: NEGATIVE
Hgb urine dipstick: NEGATIVE
Ketones, ur: NEGATIVE
Nitrite: NEGATIVE
Specific Gravity, Urine: 1.018 (ref 1.001–1.03)
pH: 7 (ref 5.0–8.0)

## 2019-03-28 LAB — URINE CULTURE
MICRO NUMBER:: 524232
Result:: NO GROWTH
SPECIMEN QUALITY:: ADEQUATE

## 2019-03-28 LAB — URINE CYTOLOGY ANCILLARY ONLY
Chlamydia: NEGATIVE
Neisseria Gonorrhea: NEGATIVE

## 2019-03-28 LAB — T-HELPER CELL (CD4) - (RCID CLINIC ONLY)
CD4 % Helper T Cell: 24 % — ABNORMAL LOW (ref 33–65)
CD4 T Cell Abs: 879 /uL (ref 400–1790)

## 2019-04-04 LAB — COMPLETE METABOLIC PANEL WITH GFR
AG Ratio: 1.6 (calc) (ref 1.0–2.5)
ALT: 20 U/L (ref 6–29)
AST: 21 U/L (ref 10–35)
Albumin: 4.4 g/dL (ref 3.6–5.1)
Alkaline phosphatase (APISO): 59 U/L (ref 37–153)
BUN/Creatinine Ratio: 16 (calc) (ref 6–22)
BUN: 25 mg/dL (ref 7–25)
CO2: 26 mmol/L (ref 20–32)
Calcium: 11.1 mg/dL — ABNORMAL HIGH (ref 8.6–10.4)
Chloride: 103 mmol/L (ref 98–110)
Creat: 1.6 mg/dL — ABNORMAL HIGH (ref 0.60–0.93)
GFR, Est African American: 35 mL/min/{1.73_m2} — ABNORMAL LOW (ref >=60)
GFR, Est Non African American: 31 mL/min/{1.73_m2} — ABNORMAL LOW (ref >=60)
Globulin: 2.8 g/dL (calc) (ref 1.9–3.7)
Glucose, Bld: 101 mg/dL — ABNORMAL HIGH (ref 65–99)
Potassium: 4.4 mmol/L (ref 3.5–5.3)
Sodium: 140 mmol/L (ref 135–146)
Total Bilirubin: 0.6 mg/dL (ref 0.2–1.2)
Total Protein: 7.2 g/dL (ref 6.1–8.1)

## 2019-04-04 LAB — CBC WITH DIFFERENTIAL/PLATELET
Absolute Monocytes: 648 cells/uL (ref 200–950)
Basophils Absolute: 41 cells/uL (ref 0–200)
Basophils Relative: 0.5 %
Eosinophils Absolute: 170 cells/uL (ref 15–500)
Eosinophils Relative: 2.1 %
HCT: 40.3 % (ref 35.0–45.0)
Hemoglobin: 13.6 g/dL (ref 11.7–15.5)
Lymphs Abs: 3718 cells/uL (ref 850–3900)
MCH: 33.6 pg — ABNORMAL HIGH (ref 27.0–33.0)
MCHC: 33.7 g/dL (ref 32.0–36.0)
MCV: 99.5 fL (ref 80.0–100.0)
MPV: 9.5 fL (ref 7.5–12.5)
Monocytes Relative: 8 %
Neutro Abs: 3524 cells/uL (ref 1500–7800)
Neutrophils Relative %: 43.5 %
Platelets: 236 10*3/uL (ref 140–400)
RBC: 4.05 10*6/uL (ref 3.80–5.10)
RDW: 14.3 % (ref 11.0–15.0)
Total Lymphocyte: 45.9 %
WBC: 8.1 10*3/uL (ref 3.8–10.8)

## 2019-04-04 LAB — HIV-1 RNA QUANT-NO REFLEX-BLD
HIV 1 RNA Quant: <20 copies/mL
HIV-1 RNA Quant, Log: <1.3 Log copies/mL

## 2019-04-04 LAB — LIPID PANEL
Cholesterol: 237 mg/dL — ABNORMAL HIGH (ref <200)
HDL: 31 mg/dL — ABNORMAL LOW (ref >=50)
Non-HDL Cholesterol (Calc): 206 mg/dL (calc) — ABNORMAL HIGH (ref <130)
Total CHOL/HDL Ratio: 7.6 (calc) — ABNORMAL HIGH (ref <5.0)
Triglycerides: 504 mg/dL — ABNORMAL HIGH (ref <150)

## 2019-04-04 LAB — RPR: RPR Ser Ql: NONREACTIVE

## 2019-04-26 ENCOUNTER — Encounter: Payer: Medicare Other | Admitting: Advanced Practice Midwife

## 2019-05-04 ENCOUNTER — Other Ambulatory Visit: Payer: Self-pay

## 2019-05-04 ENCOUNTER — Ambulatory Visit (INDEPENDENT_AMBULATORY_CARE_PROVIDER_SITE_OTHER): Payer: Medicare Other | Admitting: Obstetrics & Gynecology

## 2019-05-04 ENCOUNTER — Encounter: Payer: Self-pay | Admitting: Obstetrics & Gynecology

## 2019-05-04 VITALS — BP 174/93 | HR 57 | Ht 64.0 in | Wt 193.0 lb

## 2019-05-04 DIAGNOSIS — L900 Lichen sclerosus et atrophicus: Secondary | ICD-10-CM

## 2019-05-04 DIAGNOSIS — B373 Candidiasis of vulva and vagina: Secondary | ICD-10-CM

## 2019-05-04 DIAGNOSIS — N390 Urinary tract infection, site not specified: Secondary | ICD-10-CM

## 2019-05-04 DIAGNOSIS — B3731 Acute candidiasis of vulva and vagina: Secondary | ICD-10-CM

## 2019-05-04 LAB — POCT URINALYSIS DIPSTICK OB
Glucose, UA: NEGATIVE
Ketones, UA: NEGATIVE
Nitrite, UA: NEGATIVE

## 2019-05-04 MED ORDER — FLUCONAZOLE 100 MG PO TABS: 100.0000 mg | ORAL_TABLET | Freq: Every day | ORAL | 0 refills | Status: DC

## 2019-05-04 NOTE — Progress Notes (Signed)
Chief Complaint  Patient presents with  . Urinary Tract Infection      78 y.o. [id number] No LMP recorded. Patient has had a hysterectomy. The current method of family planning is status post hysterectomy.  Outpatient Encounter Medications as of 05/04/2019  Medication Sig Note  . abacavir-dolutegravir-lamiVUDine (TRIUMEQ) 600-50-300 MG tablet Take 1 tablet by mouth daily.   Marland Kitchen albuterol (PROVENTIL HFA;VENTOLIN HFA) 108 (90 Base) MCG/ACT inhaler Inhale 1-2 puffs into the lungs every 6 (six) hours as needed for wheezing or shortness of breath.   Marland Kitchen amitriptyline (ELAVIL) 50 MG tablet Take 50 mg by mouth at bedtime.    . Cholecalciferol (VITAMIN D3) 10000 units TABS Take 1 tablet by mouth daily.   . diphenhydrAMINE (BENADRYL) 25 mg capsule Take 25 mg by mouth daily as needed for itching (itching from hydrocodone).   Marland Kitchen docusate sodium (COLACE) 100 MG capsule Take 1 capsule (100 mg total) by mouth 2 (two) times daily.   . fluticasone (FLONASE) 50 MCG/ACT nasal spray Place 2 sprays into both nostrils at bedtime.   . furosemide (LASIX) 40 MG tablet Take 40 mg by mouth daily as needed for fluid.    Marland Kitchen guaiFENesin (MUCINEX) 600 MG 12 hr tablet Take 1 tablet (600 mg total) by mouth 2 (two) times daily. 04/02/2017: Has not started  . ipratropium-albuterol (DUONEB) 0.5-2.5 (3) MG/3ML SOLN Inhale 3 mLs into the lungs every 6 (six) hours as needed (shortness of breath).    . LORazepam (ATIVAN) 0.5 MG tablet Take 0.5 mg by mouth 2 (two) times daily.    . magnesium 30 MG tablet Take 30 mg by mouth 2 (two) times daily.   . Multiple Vitamin (ONE-A-DAY 55 PLUS PO) Take 1 tablet by mouth daily.   . nebivolol (BYSTOLIC) 10 MG tablet Take 10 mg by mouth daily.    Marland Kitchen ofloxacin (FLOXIN) 300 MG tablet Take 300 mg by mouth 2 (two) times daily.   . ondansetron (ZOFRAN) 8 MG tablet Take 8 mg by mouth daily as needed for nausea or vomiting.    . ondansetron (ZOFRAN-ODT) 4 MG disintegrating tablet Take 1 tablet (4 mg  total) by mouth every 6 (six) hours as needed for nausea.   . phenazopyridine (PYRIDIUM) 200 MG tablet Take 1 tablet (200 mg total) by mouth 3 (three) times daily as needed for pain.   . polyethylene glycol (MIRALAX / GLYCOLAX) packet Take 17 g by mouth daily.   . polyvinyl alcohol (LIQUIFILM TEARS) 1.4 % ophthalmic solution Place 1 drop into both eyes as needed for dry eyes.   Marland Kitchen PREMARIN vaginal cream Place 1 Applicatorful vaginally at bedtime.    . thiamine (VITAMIN B-1) 100 MG tablet Take 100 mg by mouth daily.   . traMADol (ULTRAM) 50 MG tablet Take 1 tablet (50 mg total) by mouth every 6 (six) hours as needed for severe pain.   . fluconazole (DIFLUCAN) 100 MG tablet Take 1 tablet (100 mg total) by mouth daily.    No facility-administered encounter medications on file as of 05/04/2019.     Subjective Pt with burning with urination has been treated for UTI recently, ?culture  Past Medical History:  Diagnosis Date  . Anxiety   . Arthritis   . Bronchitis   . CKD (chronic kidney disease) stage 3, GFR 30-59 ml/min (HCC) 05/04/2016  . COPD (chronic obstructive pulmonary disease) ([place])   . Depression   . Dysuria 03/27/2019  . Elevated LFTs 05/04/2016  . History of  TIAs 1981   left side weakness  . HIV (human immunodeficiency virus infection) ([place])   . Hypertension   . Kidney disease related to HIV infection [facility])    pt states she has to have her creatinine level checked often   . Nausea 03/03/2017  . Peripheral neuropathy    bilateral feet  . Seasonal allergies 03/27/2019  . Sinusitis 05/06/2018  . UTI (urinary tract infection) 03/03/2017  . Weight loss 03/03/2017    Past Surgical History:  Procedure Laterality Date  . ABDOMINAL HYSTERECTOMY  1985  . BACK SURGERY    . CATARACT EXTRACTION, BILATERAL    . CHOLECYSTECTOMY    . COLONOSCOPY  08/10/2002   NUR: Normal colonoscopy except for some hemorrhoids and some erythema at the dentate line  . COLONOSCOPY  12/10/2006   Edwards:Diffuse  colitis in the transverse and descending colon/This is not entirely typical of ischemic colitis or her HIV positive/ disease.  We need to be concerned about other causes  . COLONOSCOPY  01/19/2011   RMR: Internal and external hemorrhoids, likely source of hematochezia anal papilla, otherwise normal rectal mucosa/ Left-sided diverticula.  Cecal polyp with status post cold snare polypectomy.  Remainder of colonic mucosa appeared normal  . COLOSTOMY N/A 11/12/2018   Procedure: COLOSTOMY;  Surgeon: Virl Cagey, MD;  Location: [facility];  Service: General;  Laterality: N/A;  . ESOPHAGOGASTRODUODENOSCOPY  08/10/2002     NUR: Mild changes of reflux esophagitis limited to gastroesophageal junction  No evidence of ring, stricture, or esophageal candidiasis dysphagia/ Gastritis, possibly H. pylori induced  . ESOPHAGOGASTRODUODENOSCOPY  12/25/2002   NUR: Erosive antral gastritis.  The degree of gastritis is more significant than her last exam/ Normal examination of the esophagus/ Esophageal dilatation performed by passing 36 and [address] [place]  . ESOPHAGOGASTRODUODENOSCOPY  11/13/2010   RMR: Circumferential distal esophageal erosions with soft peptic stricture, consistent with erosive reflux esophagitis or stricture  formation, status post dilation as described above/  Small hiatal hernia/ Tiny antral erosions, otherwise normal stomach D1 and D2  . HEMORRHOID SURGERY  09/02/2012   Procedure: HEMORRHOIDECTOMY;  Surgeon: Jamesetta So, MD;  Location: [facility];  Service: General;  Laterality: N/A;  . LAPAROTOMY N/A 11/12/2018   Procedure: EXPLORATORY LAPAROTOMY;  Surgeon: Virl Cagey, MD;  Location: [facility];  Service: General;  Laterality: N/A;  . PARTIAL COLECTOMY N/A 11/12/2018   Procedure: PARTIAL COLECTOMY;  Surgeon: Virl Cagey, MD;  Location: [facility];  Service: General;  Laterality: N/A;  . TUBAL LIGATION      OB History    Gravida  4   Para  3   Term  2   Preterm  1   AB  1    Living  3     SAB  1   TAB      Ectopic      Multiple      Live Births              Allergies  Allergen Reactions  . Bee Venom Anaphylaxis  . Promethazine Swelling  . Tenofovir Disoproxil Fumarate [Tenofovir Disoproxil Fumarate] Other (See Comments)    Renal failure, (08/27/2006)  . Compazine  [Prochlorperazine Edisylate] Nausea And Vomiting  . Haloperidol Lactate Other (See Comments)    Reaction is muscle tension, causes severe spasms in face and neck. Forces eyes to roll in the back of the head  . Brompheniramine-Acetaminophen   . Chlorcyclizine   . Chlorpromazine   . Codeine  Itching and Nausea And Vomiting  . Navane [Thiothixene] Other (See Comments) and Nausea And Vomiting    Same muscle spasm reaction as haldol  . Other     NOVACAINE  . Prednisone     Brief mild psychosis and agitation  . Prochlorperazine Nausea And Vomiting  . Promethazine Hcl Other (See Comments)    Same as haldol   . Propoxyphene N-Acetaminophen Itching and Nausea And Vomiting  . Morphine Itching and Swelling    Social History   Socioeconomic History  . Marital status: Widowed    Spouse name: Not on file  . Number of children: Not on file  . Years of education: Not on file  . Highest education level: Not on file  Occupational History  . Occupation: retired    Fish farm manager: RETIRED  Social Needs  . Financial resource strain: Not on file  . Food insecurity    Worry: Not on file    Inability: Not on file  . Transportation needs    Medical: Not on file    Non-medical: Not on file  Tobacco Use  . Smoking status: Never Smoker  . Smokeless tobacco: Never Used  Substance and Sexual Activity  . Alcohol use: No  . Drug use: No  . Sexual activity: Not Currently    Comment: declined condoms  Lifestyle  . Physical activity    Days per week: Not on file    Minutes per session: Not on file  . Stress: Not on file  Relationships  . Social Herbalist on phone: Not on file     Gets together: Not on file    Attends religious service: Not on file    Active member of club or organization: Not on file    Attends meetings of clubs or organizations: Not on file    Relationship status: Not on file  Other Topics Concern  . Not on file  Social History Narrative  . Not on file    Family History  Problem Relation Age of Onset  . Diabetes Mother   . Diabetes Brother   . Colon cancer Brother     Medications:       Current Outpatient Medications:  .  abacavir-dolutegravir-lamiVUDine (TRIUMEQ) 600-50-300 MG tablet, Take 1 tablet by mouth daily., Disp: 30 tablet, Rfl: 11 .  albuterol (PROVENTIL HFA;VENTOLIN HFA) 108 (90 Base) MCG/ACT inhaler, Inhale 1-2 puffs into the lungs every 6 (six) hours as needed for wheezing or shortness of breath., Disp: 1 Inhaler, Rfl: 0 .  amitriptyline (ELAVIL) 50 MG tablet, Take 50 mg by mouth at bedtime. , Disp: , Rfl:  .  Cholecalciferol (VITAMIN D3) 10000 units TABS, Take 1 tablet by mouth daily., Disp: , Rfl:  .  diphenhydrAMINE (BENADRYL) 25 mg capsule, Take 25 mg by mouth daily as needed for itching (itching from hydrocodone)., Disp: , Rfl:  .  docusate sodium (COLACE) 100 MG capsule, Take 1 capsule (100 mg total) by mouth 2 (two) times daily., Disp: 10 capsule, Rfl: 0 .  fluticasone (FLONASE) 50 MCG/ACT nasal spray, Place 2 sprays into both nostrils at bedtime., Disp: 16 g, Rfl: 8 .  furosemide (LASIX) 40 MG tablet, Take 40 mg by mouth daily as needed for fluid. , Disp: , Rfl:  .  guaiFENesin (MUCINEX) 600 MG 12 hr tablet, Take 1 tablet (600 mg total) by mouth 2 (two) times daily., Disp: 30 tablet, Rfl: 0 .  ipratropium-albuterol (DUONEB) 0.5-2.5 (3) MG/3ML SOLN, Inhale 3 mLs into  the lungs every 6 (six) hours as needed (shortness of breath). , Disp: , Rfl:  .  LORazepam (ATIVAN) 0.5 MG tablet, Take 0.5 mg by mouth 2 (two) times daily. , Disp: , Rfl:  .  magnesium 30 MG tablet, Take 30 mg by mouth 2 (two) times daily., Disp: , Rfl:  .   Multiple Vitamin (ONE-A-DAY 55 PLUS PO), Take 1 tablet by mouth daily., Disp: , Rfl:  .  nebivolol (BYSTOLIC) 10 MG tablet, Take 10 mg by mouth daily. , Disp: , Rfl:  .  ofloxacin (FLOXIN) 300 MG tablet, Take 300 mg by mouth 2 (two) times daily., Disp: , Rfl:  .  ondansetron (ZOFRAN) 8 MG tablet, Take 8 mg by mouth daily as needed for nausea or vomiting. , Disp: , Rfl:  .  ondansetron (ZOFRAN-ODT) 4 MG disintegrating tablet, Take 1 tablet (4 mg total) by mouth every 6 (six) hours as needed for nausea., Disp: 20 tablet, Rfl: 0 .  phenazopyridine (PYRIDIUM) 200 MG tablet, Take 1 tablet (200 mg total) by mouth 3 (three) times daily as needed for pain., Disp: 9 tablet, Rfl: 0 .  polyethylene glycol (MIRALAX / GLYCOLAX) packet, Take 17 g by mouth daily., Disp: 14 each, Rfl: 0 .  polyvinyl alcohol (LIQUIFILM TEARS) 1.4 % ophthalmic solution, Place 1 drop into both eyes as needed for dry eyes., Disp: , Rfl:  .  PREMARIN vaginal cream, Place 1 Applicatorful vaginally at bedtime. , Disp: , Rfl:  .  thiamine (VITAMIN B-1) 100 MG tablet, Take 100 mg by mouth daily., Disp: , Rfl:  .  traMADol (ULTRAM) 50 MG tablet, Take 1 tablet (50 mg total) by mouth every 6 (six) hours as needed for severe pain., Disp: 30 tablet, Rfl: 0 .  fluconazole (DIFLUCAN) 100 MG tablet, Take 1 tablet (100 mg total) by mouth daily., Disp: 14 tablet, Rfl: 0  Objective Blood pressure (!) 174/93, pulse (!) 57, height 5\' 4"  (1.626 m), weight 193 lb (87.5 kg).  General WDWN female NAD Vulva:  Erythematous with yeast changes as well as atrophy +/- LSA Vagina:  atrophic Cervix:  absent Uterus:  uterus absent Adnexa: ovaries:not present,    Pertinent ROS No nausea, vomiting or diarrhea Nor fever chills or other constitutional symptoms   Labs or studies Pending culture    Impression Diagnoses this Encounter::   ICD-10-CM   1. Chronic UTI  N39.0 POC Urinalysis Dipstick OB  2. Vulvovaginitis due to yeast  B37.3   3. Lichen  sclerosus et atrophicus  L90.0     Established relevant diagnosis(es):   Plan/Recommendations: Meds ordered this encounter  Medications  . fluconazole (DIFLUCAN) 100 MG tablet    Sig: Take 1 tablet (100 mg total) by mouth daily.    Dispense:  14 tablet    Refill:  0    Labs or Scans Ordered: Orders Placed This Encounter  Procedures  . POC Urinalysis Dipstick OB    Management:: >diflucan x 2 weeks >gentian violet performed >re eval 3 weeks to see if has lichen sclerosus and will go from there  Follow up Return in about 3 weeks (around 05/25/2019) for Follow up, with Dr Elonda Husky.     All questions were answered.

## 2019-05-06 LAB — URINE CULTURE

## 2019-05-22 ENCOUNTER — Other Ambulatory Visit: Payer: Self-pay | Admitting: Infectious Disease

## 2019-05-22 DIAGNOSIS — B20 Human immunodeficiency virus [HIV] disease: Secondary | ICD-10-CM

## 2019-05-23 ENCOUNTER — Encounter: Payer: Self-pay | Admitting: Obstetrics & Gynecology

## 2019-05-23 ENCOUNTER — Other Ambulatory Visit: Payer: Self-pay

## 2019-05-23 ENCOUNTER — Ambulatory Visit (INDEPENDENT_AMBULATORY_CARE_PROVIDER_SITE_OTHER): Payer: Medicare Other | Admitting: Obstetrics & Gynecology

## 2019-05-23 VITALS — BP 154/80 | HR 56 | Ht 64.0 in | Wt 185.0 lb

## 2019-05-23 DIAGNOSIS — N763 Subacute and chronic vulvitis: Secondary | ICD-10-CM | POA: Diagnosis not present

## 2019-05-23 DIAGNOSIS — N3941 Urge incontinence: Secondary | ICD-10-CM | POA: Diagnosis not present

## 2019-05-23 MED ORDER — SOLIFENACIN SUCCINATE 10 MG PO TABS: 10.0000 mg | ORAL_TABLET | Freq: Every day | ORAL | 11 refills | Status: DC

## 2019-05-23 MED ORDER — FLUOCINONIDE-E 0.05 % EX CREA: 1.0000 "application " | TOPICAL_CREAM | Freq: Two times a day (BID) | CUTANEOUS | 0 refills | Status: DC

## 2019-05-23 NOTE — Progress Notes (Signed)
Chief Complaint  Patient presents with  . Follow-up      78 y.o. [id number] No LMP recorded. Patient has had a hysterectomy. The current method of family planning is status post hysterectomy.  Outpatient Encounter Medications as of 05/23/2019  Medication Sig Note  . abacavir-dolutegravir-lamiVUDine (TRIUMEQ) 600-50-300 MG tablet Take 1 tablet by mouth daily.   Marland Kitchen albuterol (PROVENTIL HFA;VENTOLIN HFA) 108 (90 Base) MCG/ACT inhaler Inhale 1-2 puffs into the lungs every 6 (six) hours as needed for wheezing or shortness of breath.   Marland Kitchen amitriptyline (ELAVIL) 50 MG tablet Take 50 mg by mouth at bedtime.    . Cholecalciferol (VITAMIN D3) 10000 units TABS Take 1 tablet by mouth daily.   . diphenhydrAMINE (BENADRYL) 25 mg capsule Take 25 mg by mouth daily as needed for itching (itching from hydrocodone).   Marland Kitchen docusate sodium (COLACE) 100 MG capsule Take 1 capsule (100 mg total) by mouth 2 (two) times daily.   . furosemide (LASIX) 40 MG tablet Take 40 mg by mouth daily as needed for fluid.    Marland Kitchen guaiFENesin (MUCINEX) 600 MG 12 hr tablet Take 1 tablet (600 mg total) by mouth 2 (two) times daily. 04/02/2017: Has not started  . ipratropium-albuterol (DUONEB) 0.5-2.5 (3) MG/3ML SOLN Inhale 3 mLs into the lungs every 6 (six) hours as needed (shortness of breath).    . LORazepam (ATIVAN) 0.5 MG tablet Take 0.5 mg by mouth 2 (two) times daily.    . magnesium 30 MG tablet Take 30 mg by mouth 2 (two) times daily.   . Multiple Vitamin (ONE-A-DAY 55 PLUS PO) Take 1 tablet by mouth daily.   . nebivolol (BYSTOLIC) 10 MG tablet Take 10 mg by mouth daily.    Marland Kitchen ofloxacin (FLOXIN) 300 MG tablet Take 300 mg by mouth 2 (two) times daily.   . ondansetron (ZOFRAN-ODT) 4 MG disintegrating tablet Take 1 tablet (4 mg total) by mouth every 6 (six) hours as needed for nausea.   . polyethylene glycol (MIRALAX / GLYCOLAX) packet Take 17 g by mouth daily.   . polyvinyl alcohol (LIQUIFILM TEARS) 1.4 % ophthalmic solution Place  1 drop into both eyes as needed for dry eyes.   Marland Kitchen thiamine (VITAMIN B-1) 100 MG tablet Take 100 mg by mouth daily.   . traMADol (ULTRAM) 50 MG tablet Take 1 tablet (50 mg total) by mouth every 6 (six) hours as needed for severe pain.   . fluconazole (DIFLUCAN) 100 MG tablet Take 1 tablet (100 mg total) by mouth daily. (Patient not taking: Reported on 05/23/2019)   . fluocinonide-emollient (LIDEX-E) 0.05 % cream Apply 1 application topically 2 (two) times daily.   . fluticasone (FLONASE) 50 MCG/ACT nasal spray 2 SPRAYS INTO BOTH NOSTRILS AT BEDTIME. (Patient not taking: Reported on 05/23/2019)   . ondansetron (ZOFRAN) 8 MG tablet Take 8 mg by mouth daily as needed for nausea or vomiting.    . phenazopyridine (PYRIDIUM) 200 MG tablet Take 1 tablet (200 mg total) by mouth 3 (three) times daily as needed for pain. (Patient not taking: Reported on 05/23/2019)   . PREMARIN vaginal cream Place 1 Applicatorful vaginally at bedtime.    . solifenacin (VESICARE) 10 MG tablet Take 1 tablet (10 mg total) by mouth daily.    No facility-administered encounter medications on file as of 05/23/2019.     Subjective Pt is in for follow up from vulvitis:yeast +/- pad related to incontinence +/- LSA She states it still burns as bad  Uses 10 pads or so a day due to incontinence, which happens anytime Symptoms worse at night Past Medical History:  Diagnosis Date  . Anxiety   . Arthritis   . Bronchitis   . CKD (chronic kidney disease) stage 3, GFR 30-59 ml/min (HCC) 05/04/2016  . COPD (chronic obstructive pulmonary disease) ([place])   . Depression   . Dysuria 03/27/2019  . Elevated LFTs 05/04/2016  . History of TIAs 1981   left side weakness  . HIV (human immunodeficiency virus infection) ([place])   . Hypertension   . Kidney disease related to HIV infection [facility])    pt states she has to have her creatinine level checked often   . Nausea 03/03/2017  . Peripheral neuropathy    bilateral feet  . Seasonal allergies 03/27/2019   . Sinusitis 05/06/2018  . UTI (urinary tract infection) 03/03/2017  . Weight loss 03/03/2017    Past Surgical History:  Procedure Laterality Date  . ABDOMINAL HYSTERECTOMY  1985  . BACK SURGERY    . CATARACT EXTRACTION, BILATERAL    . CHOLECYSTECTOMY    . COLONOSCOPY  08/10/2002   NUR: Normal colonoscopy except for some hemorrhoids and some erythema at the dentate line  . COLONOSCOPY  12/10/2006   Edwards:Diffuse colitis in the transverse and descending colon/This is not entirely typical of ischemic colitis or her HIV positive/ disease.  We need to be concerned about other causes  . COLONOSCOPY  01/19/2011   RMR: Internal and external hemorrhoids, likely source of hematochezia anal papilla, otherwise normal rectal mucosa/ Left-sided diverticula.  Cecal polyp with status post cold snare polypectomy.  Remainder of colonic mucosa appeared normal  . COLOSTOMY N/A 11/12/2018   Procedure: COLOSTOMY;  Surgeon: Virl Cagey, MD;  Location: [facility];  Service: General;  Laterality: N/A;  . ESOPHAGOGASTRODUODENOSCOPY  08/10/2002     NUR: Mild changes of reflux esophagitis limited to gastroesophageal junction  No evidence of ring, stricture, or esophageal candidiasis dysphagia/ Gastritis, possibly H. pylori induced  . ESOPHAGOGASTRODUODENOSCOPY  12/25/2002   NUR: Erosive antral gastritis.  The degree of gastritis is more significant than her last exam/ Normal examination of the esophagus/ Esophageal dilatation performed by passing 34 and [address] [place]  . ESOPHAGOGASTRODUODENOSCOPY  11/13/2010   RMR: Circumferential distal esophageal erosions with soft peptic stricture, consistent with erosive reflux esophagitis or stricture  formation, status post dilation as described above/  Small hiatal hernia/ Tiny antral erosions, otherwise normal stomach D1 and D2  . HEMORRHOID SURGERY  09/02/2012   Procedure: HEMORRHOIDECTOMY;  Surgeon: Jamesetta So, MD;  Location: [facility];  Service: General;  Laterality:  N/A;  . LAPAROTOMY N/A 11/12/2018   Procedure: EXPLORATORY LAPAROTOMY;  Surgeon: Virl Cagey, MD;  Location: [facility];  Service: General;  Laterality: N/A;  . PARTIAL COLECTOMY N/A 11/12/2018   Procedure: PARTIAL COLECTOMY;  Surgeon: Virl Cagey, MD;  Location: [facility];  Service: General;  Laterality: N/A;  . TUBAL LIGATION      OB History    Gravida  4   Para  3   Term  2   Preterm  1   AB  1   Living  3     SAB  1   TAB      Ectopic      Multiple      Live Births  3           Allergies  Allergen Reactions  . Bee Venom Anaphylaxis  . Promethazine  Swelling  . Tenofovir Disoproxil Fumarate [Tenofovir Disoproxil Fumarate] Other (See Comments)    Renal failure, (08/27/2006)  . Compazine  [Prochlorperazine Edisylate] Nausea And Vomiting  . Haloperidol Lactate Other (See Comments)    Reaction is muscle tension, causes severe spasms in face and neck. Forces eyes to roll in the back of the head  . Brompheniramine-Acetaminophen   . Chlorcyclizine   . Chlorpromazine   . Codeine Itching and Nausea And Vomiting  . Navane [Thiothixene] Other (See Comments) and Nausea And Vomiting    Same muscle spasm reaction as haldol  . Other     NOVACAINE  . Prednisone     Brief mild psychosis and agitation  . Prochlorperazine Nausea And Vomiting  . Promethazine Hcl Other (See Comments)    Same as haldol   . Propoxyphene N-Acetaminophen Itching and Nausea And Vomiting  . Morphine Itching and Swelling    Social History   Socioeconomic History  . Marital status: Widowed    Spouse name: Not on file  . Number of children: 3  . Years of education: Not on file  . Highest education level: Not on file  Occupational History  . Occupation: retired    Fish farm manager: RETIRED  Social Needs  . Financial resource strain: Not on file  . Food insecurity    Worry: Not on file    Inability: Not on file  . Transportation needs    Medical: Not on file    Non-medical: Not on file   Tobacco Use  . Smoking status: Never Smoker  . Smokeless tobacco: Never Used  Substance and Sexual Activity  . Alcohol use: No  . Drug use: No  . Sexual activity: Not Currently    Comment: declined condoms  Lifestyle  . Physical activity    Days per week: Not on file    Minutes per session: Not on file  . Stress: Not on file  Relationships  . Social Herbalist on phone: Not on file    Gets together: Not on file    Attends religious service: Not on file    Active member of club or organization: Not on file    Attends meetings of clubs or organizations: Not on file    Relationship status: Not on file  Other Topics Concern  . Not on file  Social History Narrative  . Not on file    Family History  Problem Relation Age of Onset  . Diabetes Mother   . Diabetes Brother   . Colon cancer Brother     Medications:       Current Outpatient Medications:  .  abacavir-dolutegravir-lamiVUDine (TRIUMEQ) 600-50-300 MG tablet, Take 1 tablet by mouth daily., Disp: 30 tablet, Rfl: 11 .  albuterol (PROVENTIL HFA;VENTOLIN HFA) 108 (90 Base) MCG/ACT inhaler, Inhale 1-2 puffs into the lungs every 6 (six) hours as needed for wheezing or shortness of breath., Disp: 1 Inhaler, Rfl: 0 .  amitriptyline (ELAVIL) 50 MG tablet, Take 50 mg by mouth at bedtime. , Disp: , Rfl:  .  Cholecalciferol (VITAMIN D3) 10000 units TABS, Take 1 tablet by mouth daily., Disp: , Rfl:  .  diphenhydrAMINE (BENADRYL) 25 mg capsule, Take 25 mg by mouth daily as needed for itching (itching from hydrocodone)., Disp: , Rfl:  .  docusate sodium (COLACE) 100 MG capsule, Take 1 capsule (100 mg total) by mouth 2 (two) times daily., Disp: 10 capsule, Rfl: 0 .  furosemide (LASIX) 40 MG tablet, Take 40 mg  by mouth daily as needed for fluid. , Disp: , Rfl:  .  guaiFENesin (MUCINEX) 600 MG 12 hr tablet, Take 1 tablet (600 mg total) by mouth 2 (two) times daily., Disp: 30 tablet, Rfl: 0 .  ipratropium-albuterol (DUONEB) 0.5-2.5  (3) MG/3ML SOLN, Inhale 3 mLs into the lungs every 6 (six) hours as needed (shortness of breath). , Disp: , Rfl:  .  LORazepam (ATIVAN) 0.5 MG tablet, Take 0.5 mg by mouth 2 (two) times daily. , Disp: , Rfl:  .  magnesium 30 MG tablet, Take 30 mg by mouth 2 (two) times daily., Disp: , Rfl:  .  Multiple Vitamin (ONE-A-DAY 55 PLUS PO), Take 1 tablet by mouth daily., Disp: , Rfl:  .  nebivolol (BYSTOLIC) 10 MG tablet, Take 10 mg by mouth daily. , Disp: , Rfl:  .  ofloxacin (FLOXIN) 300 MG tablet, Take 300 mg by mouth 2 (two) times daily., Disp: , Rfl:  .  ondansetron (ZOFRAN-ODT) 4 MG disintegrating tablet, Take 1 tablet (4 mg total) by mouth every 6 (six) hours as needed for nausea., Disp: 20 tablet, Rfl: 0 .  polyethylene glycol (MIRALAX / GLYCOLAX) packet, Take 17 g by mouth daily., Disp: 14 each, Rfl: 0 .  polyvinyl alcohol (LIQUIFILM TEARS) 1.4 % ophthalmic solution, Place 1 drop into both eyes as needed for dry eyes., Disp: , Rfl:  .  thiamine (VITAMIN B-1) 100 MG tablet, Take 100 mg by mouth daily., Disp: , Rfl:  .  traMADol (ULTRAM) 50 MG tablet, Take 1 tablet (50 mg total) by mouth every 6 (six) hours as needed for severe pain., Disp: 30 tablet, Rfl: 0 .  fluconazole (DIFLUCAN) 100 MG tablet, Take 1 tablet (100 mg total) by mouth daily. (Patient not taking: Reported on 05/23/2019), Disp: 14 tablet, Rfl: 0 .  fluocinonide-emollient (LIDEX-E) 0.05 % cream, Apply 1 application topically 2 (two) times daily., Disp: 30 g, Rfl: 0 .  fluticasone (FLONASE) 50 MCG/ACT nasal spray, 2 SPRAYS INTO BOTH NOSTRILS AT BEDTIME. (Patient not taking: Reported on 05/23/2019), Disp: 16 g, Rfl: 5 .  ondansetron (ZOFRAN) 8 MG tablet, Take 8 mg by mouth daily as needed for nausea or vomiting. , Disp: , Rfl:  .  phenazopyridine (PYRIDIUM) 200 MG tablet, Take 1 tablet (200 mg total) by mouth 3 (three) times daily as needed for pain. (Patient not taking: Reported on 05/23/2019), Disp: 9 tablet, Rfl: 0 .  PREMARIN vaginal  cream, Place 1 Applicatorful vaginally at bedtime. , Disp: , Rfl:  .  solifenacin (VESICARE) 10 MG tablet, Take 1 tablet (10 mg total) by mouth daily., Disp: 30 tablet, Rfl: 11  Objective Blood pressure (!) 154/80, pulse (!) 56, height 5\' 4"  (1.626 m), weight 185 lb (83.9 kg).  General WDWN female NAD Vulva:  Less erythema much less inflamed appearance, can now see the effects of the pads on the vulva directly Vagina:  atrophic    Pertinent ROS No burning with urination, frequency or urgency No nausea, vomiting or diarrhea Nor fever chills or other constitutional symptoms   Labs or studies     Impression Diagnoses this Encounter::   ICD-10-CM   1. Chronic vulvitis  N76.3    LSA vs Pad related from incontinence or combination of both  2. Urge incontinence  N39.41     Established relevant diagnosis(es):   Plan/Recommendations: Meds ordered this encounter  Medications  . solifenacin (VESICARE) 10 MG tablet    Sig: Take 1 tablet (10 mg total) by mouth  daily.    Dispense:  30 tablet    Refill:  11  . fluocinonide-emollient (LIDEX-E) 0.05 % cream    Sig: Apply 1 application topically 2 (two) times daily.    Dispense:  30 g    Refill:  0    Labs or Scans Ordered: No orders of the defined types were placed in this encounter.   Management:: >s/p aggressive treatment for yeast vulvitis >will switch to management of LSA vs pad related vulvitis  Follow up Return in about 1 month (around 06/23/2019) for Follow up, with Dr Elonda Husky.       All questions were answered.

## 2019-05-24 ENCOUNTER — Other Ambulatory Visit: Payer: Self-pay

## 2019-05-26 ENCOUNTER — Telehealth: Payer: Self-pay | Admitting: Obstetrics & Gynecology

## 2019-05-26 NOTE — Telephone Encounter (Signed)
Patient called stating that she was given a medication by Dr. Elonda Husky for her Vagina, pt states that it is burning up there when she uses it. Pt states that she might be allergic to it. Please contact pt

## 2019-05-30 ENCOUNTER — Telehealth: Payer: Self-pay | Admitting: *Deleted

## 2019-05-30 ENCOUNTER — Telehealth: Payer: Self-pay | Admitting: Obstetrics & Gynecology

## 2019-05-30 MED ORDER — GERHARDT'S BUTT CREAM: 1.0000 "application " | TOPICAL_CREAM | Freq: Three times a day (TID) | CUTANEOUS | 11 refills | Status: DC

## 2019-05-30 NOTE — Telephone Encounter (Signed)
Number on file is no longer in service. If patient calls back, Dr Elonda Husky sent in a different cream to her pharmacy.

## 2019-05-31 ENCOUNTER — Telehealth: Payer: Self-pay | Admitting: Obstetrics & Gynecology

## 2019-05-31 NOTE — Telephone Encounter (Signed)
They can use whichever but 2 or 2.5% is good

## 2019-05-31 NOTE — Telephone Encounter (Signed)
[place] is needing to know what formularity Dr. Elonda Husky would like for the Hydrocortisone (GERHARDT'S BUTT CREAM) CREAM  Please advise.

## 2019-06-01 NOTE — Telephone Encounter (Signed)
[place] back and informed tech that it can be 2 or 2.5% hydrocortisone.

## 2019-06-05 ENCOUNTER — Encounter: Payer: Self-pay | Admitting: Internal Medicine

## 2019-06-10 ENCOUNTER — Emergency Department (HOSPITAL_COMMUNITY)
Admission: EM | Admit: 2019-06-10 | Discharge: 2019-06-10 | Disposition: A | Discharge status: Home / Self Care | Payer: Medicare Other | Attending: Emergency Medicine | Admitting: Emergency Medicine

## 2019-06-10 ENCOUNTER — Encounter (HOSPITAL_COMMUNITY): Payer: Self-pay | Admitting: Emergency Medicine

## 2019-06-10 ENCOUNTER — Other Ambulatory Visit: Payer: Self-pay

## 2019-06-10 DIAGNOSIS — N183 Chronic kidney disease, stage 3 (moderate): Secondary | ICD-10-CM | POA: Insufficient documentation

## 2019-06-10 DIAGNOSIS — N390 Urinary tract infection, site not specified: Secondary | ICD-10-CM | POA: Insufficient documentation

## 2019-06-10 DIAGNOSIS — Z79899 Other long term (current) drug therapy: Secondary | ICD-10-CM | POA: Diagnosis not present

## 2019-06-10 DIAGNOSIS — Z8673 Personal history of transient ischemic attack (TIA), and cerebral infarction without residual deficits: Secondary | ICD-10-CM | POA: Insufficient documentation

## 2019-06-10 DIAGNOSIS — R1030 Lower abdominal pain, unspecified: Secondary | ICD-10-CM | POA: Diagnosis present

## 2019-06-10 DIAGNOSIS — Z21 Asymptomatic human immunodeficiency virus [HIV] infection status: Secondary | ICD-10-CM | POA: Insufficient documentation

## 2019-06-10 DIAGNOSIS — E039 Hypothyroidism, unspecified: Secondary | ICD-10-CM | POA: Diagnosis not present

## 2019-06-10 DIAGNOSIS — I129 Hypertensive chronic kidney disease with stage 1 through stage 4 chronic kidney disease, or unspecified chronic kidney disease: Secondary | ICD-10-CM | POA: Diagnosis not present

## 2019-06-10 DIAGNOSIS — J449 Chronic obstructive pulmonary disease, unspecified: Secondary | ICD-10-CM | POA: Diagnosis not present

## 2019-06-10 LAB — URINALYSIS, ROUTINE W REFLEX MICROSCOPIC
Bilirubin Urine: NEGATIVE
Glucose, UA: NEGATIVE mg/dL
Hgb urine dipstick: NEGATIVE
Ketones, ur: NEGATIVE mg/dL
Nitrite: POSITIVE — AB
Protein, ur: 30 mg/dL — AB
Specific Gravity, Urine: 1.009 (ref 1.005–1.030)
WBC, UA: >50 WBC/hpf — ABNORMAL HIGH (ref 0–5)
pH: 7 (ref 5.0–8.0)

## 2019-06-10 MED ORDER — CEPHALEXIN 500 MG PO CAPS
500.0000 mg | ORAL_CAPSULE | Freq: Once | ORAL | Status: AC
  Administered 2019-06-10: 500 mg via ORAL
  Filled 2019-06-10: qty 1

## 2019-06-10 MED ORDER — PHENAZOPYRIDINE HCL 100 MG PO TABS
200.0000 mg | ORAL_TABLET | Freq: Once | ORAL | Status: AC
  Administered 2019-06-10: 22:00:00 200 mg via ORAL
  Filled 2019-06-10: qty 2

## 2019-06-10 MED ORDER — FLUCONAZOLE 200 MG PO TABS: ORAL_TABLET | ORAL | 1 refills | Status: DC

## 2019-06-10 MED ORDER — CEPHALEXIN 500 MG PO CAPS: 500.0000 mg | ORAL_CAPSULE | Freq: Four times a day (QID) | ORAL | 0 refills | Status: DC

## 2019-06-10 NOTE — Discharge Instructions (Signed)
Drink plenty of water.  Take the antibiotic as directed until its finished.  Call Dr.Eure's office to arrange a follow-up appointment for next week.  Return to the ER for any worsening symptoms such as fever, vomiting, or increasing pain.

## 2019-06-10 NOTE — ED Triage Notes (Signed)
Pt is seeing Dr. Elonda Husky regarding vaginal pain. Was treated for a UTI but worsening with pain.  Pt feels like " when I stand up, I feel like something is coming out of vagina"

## 2019-06-10 NOTE — ED Provider Notes (Signed)
[facility] EMERGENCY DEPARTMENT Provider Note   CSN: [id number] Arrival date & time: 06/10/19  1832     History   Chief Complaint Chief Complaint  Patient presents with  . Vaginal Pain    HPI [name] is a 78 y.o. female.     HPI   [name] is a 78 y.o. female who presents to the Emergency Department complaining of suprapubic pain and pressure.  Symptoms have been present for several days.  She states this is a recurring problem for her.  She endorses frequent urinary tract infections.  She also endorses having burning with urination and only voiding small amounts.  She states she was seen by her gynecologist earlier this week for similar symptoms and was treated with hydrocortisone cream.  She states she was unable to tolerate this stating that it made her vaginal area burn.  She denies pain to her upper abdomen, flank pain, fever, chills, nausea or vomiting.   Past Medical History:  Diagnosis Date  . Anxiety   . Arthritis   . Bronchitis   . CKD (chronic kidney disease) stage 3, GFR 30-59 ml/min (HCC) 05/04/2016  . COPD (chronic obstructive pulmonary disease) ([place])   . Depression   . Dysuria 03/27/2019  . Elevated LFTs 05/04/2016  . History of TIAs 1981   left side weakness  . HIV (human immunodeficiency virus infection) ([place])   . Hypertension   . Kidney disease related to HIV infection [facility])    pt states she has to have her creatinine level checked often   . Nausea 03/03/2017  . Peripheral neuropathy    bilateral feet  . Seasonal allergies 03/27/2019  . Sinusitis 05/06/2018  . UTI (urinary tract infection) 03/03/2017  . Weight loss 03/03/2017    Patient Active Problem List   Diagnosis Date Noted  . Seasonal allergies 03/27/2019  . Dysuria 03/27/2019  . Obesity (BMI 30-39.9) 11/12/2018  . Constipation due to opioid therapy 11/12/2018  . Lactic acidosis 11/12/2018  . Large bowel perforation ([place]) 11/12/2018  . Peritonitis ([place]) 11/12/2018  . Stercoral  ulcer of large intestine 11/12/2018  . Pneumoperitoneum   . COPD (chronic obstructive pulmonary disease) ([place]) 12/17/2017  . Hypothyroidism 12/17/2017  . Steroid-induced psychosis, with hallucinations ([place])   . UTI (urinary tract infection) 03/03/2017  . Nausea 03/03/2017  . Weight loss 03/03/2017  . CAP (community acquired pneumonia) 07/22/2016  . CKD (chronic kidney disease) stage 3, GFR 30-59 ml/min (HCC) 05/04/2016  . Elevated LFTs 05/04/2016  . 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 Breath shortness 06/19/2014  . Syncope 05/31/2014  . Rib fracture 05/31/2014  . Weight gain 03/21/2014  . Cervical spondylosis 08/15/2013  . Neck pain on left side 05/24/2013  . Unspecified vitamin D deficiency 12/14/2012  . Vitamin D toxicity 12/14/2012  . Hemorrhoid 09/01/2012  . Myalgia 02/25/2012  . HTN (hypertension) 11/25/2011  . Cramps, muscle, general 08/18/2011  . RECTAL BLEEDING 11/03/2010  . DYSPHAGIA 11/03/2010  . FATIGUE 08/29/2010  . MUSCLE PAIN 06/04/2010  . Acute cystitis 05/12/2010  . RENAL INSUFFICIENCY 12/18/2008  . DEPRESSION 09/04/2008  . IRRITABLE BOWEL SYNDROME 05/22/2008  . HERNIATED CERVICAL DISC 10/05/2007  . HERNIATED LUMBAR DISC 10/05/2007  . History of urinary tract infection 09/20/2007  . HIV-1 associated autonomic neuropathy ([place]) 03/16/2007  . ELBOW PAIN 03/16/2007  . ISCHEMIC COLITIS 12/27/2006  . RENAL FAILURE NOS 12/27/2006  . Human immunodeficiency virus (HIV) disease ([place]) 08/27/2006    Past Surgical History:  Procedure Laterality Date  .  ABDOMINAL HYSTERECTOMY  1985  . BACK SURGERY    . CATARACT EXTRACTION, BILATERAL    . CHOLECYSTECTOMY    . COLONOSCOPY  08/10/2002   NUR: Normal colonoscopy except for some hemorrhoids and some erythema at the dentate line  . COLONOSCOPY  12/10/2006   Edwards:Diffuse colitis in the transverse and descending colon/This is not entirely typical of ischemic colitis or her HIV positive/ disease.  We need to be concerned  about other causes  . COLONOSCOPY  01/19/2011   RMR: Internal and external hemorrhoids, likely source of hematochezia anal papilla, otherwise normal rectal mucosa/ Left-sided diverticula.  Cecal polyp with status post cold snare polypectomy.  Remainder of colonic mucosa appeared normal  . COLOSTOMY N/A 11/12/2018   Procedure: COLOSTOMY;  Surgeon: Virl Cagey, MD;  Location: [facility];  Service: General;  Laterality: N/A;  . ESOPHAGOGASTRODUODENOSCOPY  08/10/2002     NUR: Mild changes of reflux esophagitis limited to gastroesophageal junction  No evidence of ring, stricture, or esophageal candidiasis dysphagia/ Gastritis, possibly H. pylori induced  . ESOPHAGOGASTRODUODENOSCOPY  12/25/2002   NUR: Erosive antral gastritis.  The degree of gastritis is more significant than her last exam/ Normal examination of the esophagus/ Esophageal dilatation performed by passing 109 and [address] [place]  . ESOPHAGOGASTRODUODENOSCOPY  11/13/2010   RMR: Circumferential distal esophageal erosions with soft peptic stricture, consistent with erosive reflux esophagitis or stricture  formation, status post dilation as described above/  Small hiatal hernia/ Tiny antral erosions, otherwise normal stomach D1 and D2  . HEMORRHOID SURGERY  09/02/2012   Procedure: HEMORRHOIDECTOMY;  Surgeon: Jamesetta So, MD;  Location: [facility];  Service: General;  Laterality: N/A;  . LAPAROTOMY N/A 11/12/2018   Procedure: EXPLORATORY LAPAROTOMY;  Surgeon: Virl Cagey, MD;  Location: [facility];  Service: General;  Laterality: N/A;  . PARTIAL COLECTOMY N/A 11/12/2018   Procedure: PARTIAL COLECTOMY;  Surgeon: Virl Cagey, MD;  Location: [facility];  Service: General;  Laterality: N/A;  . TUBAL LIGATION       OB History    Gravida  4   Para  3   Term  2   Preterm  1   AB  1   Living  3     SAB  1   TAB      Ectopic      Multiple      Live Births  3            Home Medications    Prior to Admission  medications   Medication Sig Start Date End Date Taking? Authorizing Provider  abacavir-dolutegravir-lamiVUDine (TRIUMEQ) 600-50-300 MG tablet Take 1 tablet by mouth daily. 03/27/19  Yes [name], Lavell Islam, MD  albuterol (PROVENTIL HFA;VENTOLIN HFA) 108 (90 Base) MCG/ACT inhaler Inhale 1-2 puffs into the lungs every 6 (six) hours as needed for wheezing or shortness of breath. 04/02/17  Yes Nat Christen, MD  amitriptyline (ELAVIL) 50 MG tablet Take 50 mg by mouth at bedtime.    Yes [provider]  atorvastatin (LIPITOR) 40 MG tablet Take 40 mg by mouth daily. 06/07/19  Yes [provider]  Cholecalciferol (VITAMIN D3) 10000 units TABS Take 1 tablet by mouth daily.   Yes [provider]  diphenhydrAMINE (BENADRYL) 25 mg capsule Take 25 mg by mouth daily as needed for itching (itching from hydrocodone).   Yes [provider]  docusate sodium (COLACE) 100 MG capsule Take 1 capsule (100 mg total) by mouth 2 (two) times  daily. 11/21/18  Yes Virl Cagey, MD  fluticasone (FLONASE) 50 MCG/ACT nasal spray 2 SPRAYS INTO BOTH NOSTRILS AT BEDTIME. Patient taking differently: Place 2 sprays into both nostrils at bedtime.  05/22/19  Yes [name], Lavell Islam, MD  furosemide (LASIX) 40 MG tablet Take 40 mg by mouth daily as needed for fluid.    Yes [provider]  ipratropium-albuterol (DUONEB) 0.5-2.5 (3) MG/3ML SOLN Inhale 3 mLs into the lungs every 6 (six) hours as needed (shortness of breath).  04/02/17  Yes [provider]  LORazepam (ATIVAN) 0.5 MG tablet Take 0.5 mg by mouth 2 (two) times daily.  11/01/18  Yes [provider]  magnesium 30 MG tablet Take 30 mg by mouth at bedtime.    Yes [provider]  Multiple Vitamin (ONE-A-DAY 55 PLUS PO) Take 1 tablet by mouth daily.   Yes [provider]  nebivolol (BYSTOLIC) 10 MG tablet Take 10 mg by mouth daily.    Yes [provider]  ondansetron (ZOFRAN) 8 MG tablet Take 8 mg  by mouth daily as needed for nausea or vomiting.    Yes [provider]  polyethylene glycol (MIRALAX / GLYCOLAX) packet Take 17 g by mouth daily. 11/22/18  Yes Virl Cagey, MD  polyvinyl alcohol (LIQUIFILM TEARS) 1.4 % ophthalmic solution Place 1 drop into both eyes as needed for dry eyes.   Yes [provider]  solifenacin (VESICARE) 10 MG tablet Take 1 tablet (10 mg total) by mouth daily. 05/23/19  Yes Florian Buff, MD  thiamine (VITAMIN B-1) 100 MG tablet Take 100 mg by mouth daily.   Yes [provider]  fluconazole (DIFLUCAN) 100 MG tablet Take 1 tablet (100 mg total) by mouth daily. Patient not taking: Reported on 05/23/2019 05/04/19   Florian Buff, MD  fluocinonide-emollient (LIDEX-E) 0.05 % cream Apply 1 application topically 2 (two) times daily. Patient not taking: Reported on 06/10/2019 05/23/19   Florian Buff, MD  Hydrocortisone (GERHARDT'S BUTT CREAM) CREA Apply 1 application topically 3 (three) times daily. Patient not taking: Reported on 06/10/2019 05/30/19   Florian Buff, MD    Family History Family History  Problem Relation Age of Onset  . Diabetes Mother   . Diabetes Brother   . Colon cancer Brother     Social History Social History   Tobacco Use  . Smoking status: Never Smoker  . Smokeless tobacco: Never Used  Substance Use Topics  . Alcohol use: No  . Drug use: No     Allergies   Bee venom, Promethazine, Tenofovir disoproxil fumarate [tenofovir disoproxil fumarate], Compazine  [prochlorperazine edisylate], Haloperidol lactate, Brompheniramine-acetaminophen, Chlorcyclizine, Chlorpromazine, Codeine, Navane [thiothixene], Other, Prednisone, Promethazine hcl, Propoxyphene n-acetaminophen, and Morphine   Review of Systems Review of Systems  Constitutional: Negative for activity change, appetite change, chills and fever.  Respiratory: Negative for chest tightness and shortness of breath.   Gastrointestinal: Negative for abdominal  pain, nausea and vomiting.  Genitourinary: Positive for dysuria, frequency and vaginal pain. Negative for decreased urine volume, difficulty urinating, flank pain, hematuria, urgency, vaginal bleeding and vaginal discharge.  Musculoskeletal: Negative for back pain.  Skin: Negative for rash.  Neurological: Negative for dizziness, weakness and numbness.  Hematological: Negative for adenopathy.  Psychiatric/Behavioral: Negative for confusion.     Physical Exam Updated Vital Signs BP (!) 181/95   Pulse 60   Temp 98.4 F (36.9 C) (Oral)   Resp 17   Ht 5\' 4"  (1.626 m)  Wt 83 kg   SpO2 97%   BMI 31.41 kg/m   Physical Exam Vitals signs and nursing note reviewed.  Constitutional:      General: She is not in acute distress.    Appearance: Normal appearance. She is well-developed. She is not ill-appearing or toxic-appearing.  HENT:     Head: Normocephalic and atraumatic.     Mouth/Throat:     Mouth: Mucous membranes are moist.  Cardiovascular:     Rate and Rhythm: Normal rate and regular rhythm.     Pulses: Normal pulses.  Pulmonary:     Effort: Pulmonary effort is normal. No respiratory distress.     Breath sounds: Normal breath sounds. No rales.  Abdominal:     General: There is no distension.     Palpations: Abdomen is soft. Abdomen is not rigid. There is no mass.     Tenderness: There is abdominal tenderness in the suprapubic area. There is no right CVA tenderness, left CVA tenderness, guarding or rebound. Negative signs include McBurney's sign.     Comments: Mild ttp of the suprapubic region.  Remaining abdomen is soft, non-tender without guarding or rebound tenderness. No CVA tenderness  Musculoskeletal: Normal range of motion.  Skin:    General: Skin is warm.     Capillary Refill: Capillary refill takes less than 2 seconds.     Findings: No rash.  Neurological:     General: No focal deficit present.     Mental Status: She is alert.     Sensory: No sensory deficit.      Motor: No weakness.      ED Treatments / Results  Labs (all labs ordered are listed, but only abnormal results are displayed) Labs Reviewed  URINALYSIS, ROUTINE W REFLEX MICROSCOPIC - Abnormal; Notable for the following components:      Result Value   Color, Urine AMBER (*)    APPearance TURBID (*)    Protein, ur 30 (*)    Nitrite POSITIVE (*)    Leukocytes,Ua LARGE (*)    WBC, UA >50 (*)    Bacteria, UA RARE (*)    All other components within normal limits  URINE CULTURE    EKG None  Radiology No results found.  Procedures Procedures (including critical care time)  Medications Ordered in ED Medications  cephALEXin (KEFLEX) capsule 500 mg (has no administration in time range)  phenazopyridine (PYRIDIUM) tablet 200 mg (has no administration in time range)     Initial Impression / Assessment and Plan / ED Course  I have reviewed the triage vital signs and the nursing notes.  Pertinent labs & imaging results that were available during my care of the patient were reviewed by me and considered in my medical decision making (see chart for details).        Patient well-appearing.  Vital signs reviewed.  She has ambulated to the rest room multiple times w/o difficulty.  Patient has urinary tract infection.  Seen here in November for similar symptoms.  She is nontoxic-appearing.  No concerning symptoms for urosepsis, pyelonephritis or kidney stone.  Previous urine culture from November 2019 grew E. coli.  She has multiple medication allergies but can take Keflex.  I will repeat urine culture this evening.  She will also be given prescription for Diflucan and she agrees to close follow-up with her gynecologist.  She appears appropriate for discharge home.  Strict return precautions were also discussed.  Final Clinical Impressions(s) / ED Diagnoses   Final  diagnoses:  Urinary tract infection without hematuria, site unspecified    ED Discharge Orders    None        Bufford Lope 06/10/19 2223    Milton Ferguson, MD 06/13/19 1302

## 2019-06-13 LAB — URINE CULTURE: Culture: >=100000 — AB

## 2019-06-14 ENCOUNTER — Telehealth: Payer: Self-pay | Admitting: Emergency Medicine

## 2019-06-14 NOTE — Telephone Encounter (Signed)
Post ED Visit - Positive Culture Follow-up  Culture report reviewed by antimicrobial stewardship pharmacist: [place] Team []  Elenor Quinones, Pharm.D. []  Heide Guile, Pharm.D., BCPS AQ-ID []  Parks Neptune, Pharm.D., BCPS []  Alycia Rossetti, Pharm.D., BCPS []  [place], Pharm.D., BCPS, AAHIVP []  Legrand Como, Pharm.D., BCPS, AAHIVP []  Salome Arnt, PharmD, BCPS []  Johnnette Gourd, PharmD, BCPS []  Hughes Better, PharmD, BCPS []  Leeroy Cha, PharmD []  Laqueta Linden, PharmD, BCPS []  Albertina Parr, PharmD Nicoletta Dress PharmD  [place] Team []  Leodis Sias, PharmD []  Lindell Spar, PharmD []  Royetta Asal, PharmD []  Graylin Shiver, Rph []  Rema Fendt) Glennon Mac, PharmD []  Arlyn Dunning, PharmD []  Netta Cedars, PharmD []  Dia Sitter, PharmD []  Leone Haven, PharmD []  Gretta Arab, PharmD []  Theodis Shove, PharmD []  Peggyann Juba, PharmD []  Reuel Boom, PharmD   Positive urine culture Treated with cephalexin and fluconazole, organism sensitive to the same and no further patient follow-up is required at this time.  Hazle Nordmann 06/14/2019, 11:33 AM

## 2019-06-23 ENCOUNTER — Ambulatory Visit: Payer: Medicare Other | Admitting: Obstetrics & Gynecology

## 2019-06-27 ENCOUNTER — Ambulatory Visit: Payer: Medicare Other | Admitting: Obstetrics & Gynecology

## 2019-07-04 NOTE — Progress Notes (Signed)
Referring Provider: Dr. Curlene Labrum Primary Care Physician:  System, Pcp Not In Primary Gastroenterologist:  Dr. Gala Romney  Chief Complaint  Patient presents with  . Colonoscopy    consult; ?reverse colostomy    HPI:   [name] is a 78 y.o. female with past medical history significant for COPD, CKD, HIV, HTN, anxiety, depression, and colon polyps. She is presenting today at the request of Dr. Constance Haw for consult colonoscopy prior to colostomy reversal. Last seen in our office in November 2013 for  Hemorrhoid follow-up.   Patient was admitted from 11/12/18-11/21/18 with pneumoperitoneum related to a large bowel perforation and peritonitis. She underwent exploratory laparotomy and partial colectomy with segmental resection of the proximal descending colon and end colostomy for sterocoral ulcer. She developed post op A. fib with RVR. IV Cardizem given and she converted back to nsr within 5 hours. Per Dr. Constance Haw note on 03/23/19, patient would need barium enema and repeat colonoscopy prior to reversal of colostomy. Dr. Constance Haw to obtain barium enema. Patient saw cardiology in follow-up in March. Patient was doing well and in nsr. Per their note, no further cardiac testing prior to proceeding with her upcoming colonoscopy. Recommendations to have cardiac telemetry at time of surgery for monitoring.   Last colonoscopy in March 2012 with Dr. Gala Romney. She had poor prep, but the exam was able to be completed. Findings included internal and external hemorrhoids, likely source of hematochezia, anal papilla, otherwise normal rectal exam. Left sided diverticula. Cecal polyp. Remainder of colonic mucosa appeared normal although formed stool in the right colon made exam more difficult, small lesion may have been obscured. Pathology with tubular adenoma. Recommended repeat in 2017.   Today she states she is having no bowel trouble at this time. Has soft stools coming out of ostomy. Takes colace and metamucil  daily. No bright red blood is stool or black stools. Reports that she does continue to have small pieces of stool and mucous coming out of her rectum from time to time. She has discussed this with Dr. Constance Haw who has told her this was normal. States she will have some lower abdominal pain on and off. This has been going on prior to her surgery. Present for over 1 year. More on the left. Not worsening. No pain at this time. No identified triggers. Nothing makes it better. Her daughter in law states they have been in the ED for the pain and it is usually a UTI. Reports frequent UTIs. Endorses burning with urination at this time and states lower abdomen will hurt some when urinating. Denies increased frequency in urination, or blood in the urine.   Denies heartburn, acid reflux, dysphagia, or upper abdominal pain. Occasional nausea, no vomiting.   No fever or chills. No unintentional weight loss. No chest pain. Occasional palpitations. Last about 10 minutes. Happened about 3 times since being home from hospital. Last occurrence last Friday. Has felt like she would pass out during these times. Daughter in law reports patient has EKGs with pain management monthly. States the providers there have told them they wonder if something is going on with her heart but have not stated what the concern is or requested any further evaluation. Admits to SOB with exertion, none at rest. This is her baseline. Some chronic cough.    Past Medical History:  Diagnosis Date  . Anxiety   . Arthritis   . Bronchitis   . CKD (chronic kidney disease) stage 3, GFR 30-59 ml/min (HCC) 05/04/2016  .  COPD (chronic obstructive pulmonary disease) ([place])   . Depression   . Dysuria 03/27/2019  . Elevated LFTs 05/04/2016  . History of TIAs 1981   left side weakness  . HIV (human immunodeficiency virus infection) ([place])   . Hypertension   . Kidney disease related to HIV infection [facility])    pt states she has to have her creatinine level  checked often   . Nausea 03/03/2017  . Peripheral neuropathy    bilateral feet  . Seasonal allergies 03/27/2019  . Sinusitis 05/06/2018  . UTI (urinary tract infection) 03/03/2017  . Weight loss 03/03/2017    Past Surgical History:  Procedure Laterality Date  . ABDOMINAL HYSTERECTOMY  1985  . BACK SURGERY    . CATARACT EXTRACTION, BILATERAL    . CHOLECYSTECTOMY    . COLONOSCOPY  08/10/2002   NUR: Normal colonoscopy except for some hemorrhoids and some erythema at the dentate line  . COLONOSCOPY  12/10/2006   Edwards:Diffuse colitis in the transverse and descending colon/This is not entirely typical of ischemic colitis or her HIV positive/ disease.  We need to be concerned about other causes  . COLONOSCOPY  01/19/2011   RMR: Internal and external hemorrhoids, likely source of hematochezia anal papilla, otherwise normal rectal mucosa/ Left-sided diverticula.  Cecal polyp with status post cold snare polypectomy.  Remainder of colonic mucosa appeared normal  . COLOSTOMY N/A 11/12/2018   Procedure: COLOSTOMY;  Surgeon: Virl Cagey, MD;  Location: [facility];  Service: General;  Laterality: N/A;  . ESOPHAGOGASTRODUODENOSCOPY  08/10/2002     NUR: Mild changes of reflux esophagitis limited to gastroesophageal junction  No evidence of ring, stricture, or esophageal candidiasis dysphagia/ Gastritis, possibly H. pylori induced  . ESOPHAGOGASTRODUODENOSCOPY  12/25/2002   NUR: Erosive antral gastritis.  The degree of gastritis is more significant than her last exam/ Normal examination of the esophagus/ Esophageal dilatation performed by passing 53 and [address] [place]  . ESOPHAGOGASTRODUODENOSCOPY  11/13/2010   RMR: Circumferential distal esophageal erosions with soft peptic stricture, consistent with erosive reflux esophagitis or stricture  formation, status post dilation as described above/  Small hiatal hernia/ Tiny antral erosions, otherwise normal stomach D1 and D2  . HEMORRHOID SURGERY  09/02/2012    Procedure: HEMORRHOIDECTOMY;  Surgeon: Jamesetta So, MD;  Location: [facility];  Service: General;  Laterality: N/A;  . LAPAROTOMY N/A 11/12/2018   Procedure: EXPLORATORY LAPAROTOMY;  Surgeon: Virl Cagey, MD;  Location: [facility];  Service: General;  Laterality: N/A;  . PARTIAL COLECTOMY N/A 11/12/2018   Procedure: PARTIAL COLECTOMY;  Surgeon: Virl Cagey, MD;  Location: [facility];  Service: General;  Laterality: N/A;  . TUBAL LIGATION      Current Outpatient Medications  Medication Sig Dispense Refill  . abacavir-dolutegravir-lamiVUDine (TRIUMEQ) 600-50-300 MG tablet Take 1 tablet by mouth daily. 30 tablet 11  . albuterol (PROVENTIL HFA;VENTOLIN HFA) 108 (90 Base) MCG/ACT inhaler Inhale 1-2 puffs into the lungs every 6 (six) hours as needed for wheezing or shortness of breath. 1 Inhaler 0  . amitriptyline (ELAVIL) 50 MG tablet Take 50 mg by mouth at bedtime.     . Cholecalciferol (VITAMIN D3) 10000 units TABS Take 1 tablet by mouth daily.    . diphenhydrAMINE (BENADRYL) 25 mg capsule Take 25 mg by mouth daily as needed for itching (itching from hydrocodone).    Marland Kitchen docusate sodium (COLACE) 100 MG capsule Take 1 capsule (100 mg total) by mouth 2 (two) times daily. 10 capsule 0  .  fluocinonide-emollient (LIDEX-E) 0.05 % cream Apply 1 application topically 2 (two) times daily. 30 g 0  . fluticasone (FLONASE) 50 MCG/ACT nasal spray 2 SPRAYS INTO BOTH NOSTRILS AT BEDTIME. (Patient taking differently: Place 2 sprays into both nostrils at bedtime. ) 16 g 5  . furosemide (LASIX) 40 MG tablet Take 40 mg by mouth daily as needed for fluid.     Marland Kitchen ipratropium-albuterol (DUONEB) 0.5-2.5 (3) MG/3ML SOLN Inhale 3 mLs into the lungs every 6 (six) hours as needed (shortness of breath).     . LORazepam (ATIVAN) 0.5 MG tablet Take 0.5 mg by mouth 2 (two) times daily.     . magnesium 30 MG tablet Take 30 mg by mouth at bedtime.     . Multiple Vitamin (ONE-A-DAY 55 PLUS PO) Take 1 tablet by mouth daily.    .  nebivolol (BYSTOLIC) 10 MG tablet Take 10 mg by mouth daily.     . ondansetron (ZOFRAN) 8 MG tablet Take 8 mg by mouth daily as needed for nausea or vomiting.     . polyvinyl alcohol (LIQUIFILM TEARS) 1.4 % ophthalmic solution Place 1 drop into both eyes as needed for dry eyes.    . Psyllium (METAMUCIL PO) Take by mouth. 1 tsp twice daily    . solifenacin (VESICARE) 10 MG tablet Take 1 tablet (10 mg total) by mouth daily. 30 tablet 11  . thiamine (VITAMIN B-1) 100 MG tablet Take 100 mg by mouth daily.     No current facility-administered medications for this visit.     Allergies as of 07/06/2019 - Review Complete 07/06/2019  Allergen Reaction Noted  . Bee venom Anaphylaxis 08/27/2012  . Promethazine Swelling 06/18/2014  . Tenofovir disoproxil fumarate [tenofovir disoproxil fumarate] Other (See Comments) 03/05/2011  . Compazine  [prochlorperazine edisylate] Nausea And Vomiting 06/18/2014  . Haloperidol lactate Other (See Comments)   . Brompheniramine-acetaminophen  06/10/2017  . Chlorcyclizine  06/10/2017  . Chlorpromazine  06/10/2017  . Codeine Itching and Nausea And Vomiting   . Navane [thiothixene] Other (See Comments) and Nausea And Vomiting   . Other  06/10/2017  . Prednisone  04/01/2017  . Promethazine hcl Other (See Comments)   . Propoxyphene n-acetaminophen Itching and Nausea And Vomiting   . Morphine Itching and Swelling 08/06/2014    Family History  Problem Relation Age of Onset  . Diabetes Mother   . Diabetes Brother   . Colon cancer Brother        Diagnosed > age 12    Social History   Socioeconomic History  . Marital status: Widowed    Spouse name: Not on file  . Number of children: 3  . Years of education: Not on file  . Highest education level: Not on file  Occupational History  . Occupation: retired    Fish farm manager: RETIRED  Social Needs  . Financial resource strain: Not on file  . Food insecurity    Worry: Not on file    Inability: Not on file  .  Transportation needs    Medical: Not on file    Non-medical: Not on file  Tobacco Use  . Smoking status: Never Smoker  . Smokeless tobacco: Never Used  Substance and Sexual Activity  . Alcohol use: No  . Drug use: No  . Sexual activity: Not Currently    Comment: declined condoms  Lifestyle  . Physical activity    Days per week: Not on file    Minutes per session: Not on file  .  Stress: Not on file  Relationships  . Social Herbalist on phone: Not on file    Gets together: Not on file    Attends religious service: Not on file    Active member of club or organization: Not on file    Attends meetings of clubs or organizations: Not on file    Relationship status: Not on file  . Intimate partner violence    Fear of current or ex partner: Not on file    Emotionally abused: Not on file    Physically abused: Not on file    Forced sexual activity: Not on file  Other Topics Concern  . Not on file  Social History Narrative  . Not on file    Review of Systems: Gen: See HPI CV: See HPI Resp: See HPI GI: See HPI GU :See HPI MSK: Denies significant joint pain Neuro: Admits to neuropathy in her feet.  Derm: Denies rash Psych: Denies depression, anxiety Heme: Denies bruising, bleeding  Physical Exam: BP 134/73   Pulse (!) 48   Temp (!) 96.6 F (35.9 C) (Temporal)   Ht 5\' 4"  (1.626 m)   Wt 193 lb 6.4 oz (87.7 kg)   BMI 33.20 kg/m  General:   Alert and oriented. Pleasant and cooperative. Well-nourished and well-developed.  Head:  Normocephalic and atraumatic. Eyes:  Without icterus, sclera clear and conjunctiva pink.  Ears:  Normal auditory acuity. Nose:  No deformity, discharge,  or lesions. Lungs:  Clear to auscultation bilaterally. No wheezes, rales, or rhonchi. No distress.  Heart:  S1, S2 present without murmurs appreciated.  Abdomen:  Colostomy bag in place in LUQ. Scar midline at umbilicus. +BS, soft, non-tender and non-distended. No HSM noted. No guarding  or rebound. No masses appreciated.  Rectal:  Deferred  Msk:  Symmetrical without gross deformities. Normal posture. Extremities:  Without edema. Neurologic:  Alert and  oriented x4;  grossly normal neurologically. Skin:  Intact without significant lesions or rashes. Psych: Normal mood and affect.

## 2019-07-06 ENCOUNTER — Encounter: Payer: Self-pay | Admitting: Gastroenterology

## 2019-07-06 ENCOUNTER — Ambulatory Visit: Payer: Medicare Other | Admitting: Gastroenterology

## 2019-07-06 ENCOUNTER — Other Ambulatory Visit: Payer: Self-pay

## 2019-07-06 VITALS — BP 134/73 | HR 48 | Temp 96.6°F | Ht 64.0 in | Wt 193.4 lb

## 2019-07-06 DIAGNOSIS — R300 Dysuria: Secondary | ICD-10-CM | POA: Diagnosis not present

## 2019-07-06 DIAGNOSIS — Z9049 Acquired absence of other specified parts of digestive tract: Secondary | ICD-10-CM | POA: Diagnosis not present

## 2019-07-06 DIAGNOSIS — Z86010 Personal history of colonic polyps: Secondary | ICD-10-CM

## 2019-07-06 NOTE — Patient Instructions (Signed)
Please follow-up with cardiology regarding your heart palpitations. I will also send them a message to help get the ball rolling.   Once you have seen them, we will be able to get you scheduled for colonoscopy.   I have placed orders to have your urine checked for the urinary burning you are having.   Aliene Altes, PA-C [facility]

## 2019-07-06 NOTE — Assessment & Plan Note (Signed)
78 year old female with history of colon polyps.  Last colonoscopy in 2012.  She did have poor prep but exam was able to be completed.  Findings included internal and external hemorrhoids, anal papilla, otherwise normal rectal exam.  Left-sided diverticula.  1 cecal polyp removed and pathology revealed tubular adenoma.  She was due for surveillance in 2017.  She did undergo partial colectomy with end colostomy for sterocoral ulcer in January 2020 as described below.  No significant upper or lower GI symptoms at this time.  No alarm symptoms.  Past family history significant for brother with colon cancer but diagnosed over age 72.  We will plan to proceed with colonoscopy with propofol for surveillance as well as pre-colostomy reversal evaluation.  Prior to scheduling, patient is to follow-up with cardiology regarding heart palpitations as described below. The risks, benefits, and alternatives have been discussed in detail with patient. They have stated understanding and desire to proceed.

## 2019-07-06 NOTE — Assessment & Plan Note (Addendum)
78 year old female with past medical history of COPD, CKD, HIV, HTN, anxiety, depression, and colon polyps who is presenting in the request of Dr. Constance Haw for colonoscopy prior to end colostomy reversal.  Patient had partial colectomy with segmental resection of the proximal descending colon and end colostomy for sterocoral ulcer in January 2020.  Per Dr. Constance Haw last note, she will obtain a barium enema and has requested patient to have colonoscopy.  Patient is also overdue for surveillance colonoscopy.  Last colonoscopy in 2012 with 1 cecal polyp.  Pathology identified tubular adenoma.  She was due for surveillance in 2017. She is doing well at this time without any significant lower or upper GI symptoms.  She does report intermittent lower abdominal pain that has been present for over 1 year and seems to be associated with frequent UTIs.  No pain at this time but she does endorse burning with urination.  Also reports she will pass small pieces of stool and mucus per rectum from time to time.  She has discussed with Dr. Constance Haw who has told her it was normal.  Denies bright red blood or melena per rectum or ostomy.  Family history significant for brother with colon cancer but diagnosed over age 61.  Of note patient did develop atrial fibrillation with RVR after her surgery.  She received IV Cardizem and converted back to normal sinus rhythm within 5 hours.  She saw cardiology for follow-up in March who reported patient was in normal sinus rhythm and without any recurrence of symptoms.  They advised no further testing prior to colonoscopy but did recommend cardiac telemetry at the time of procedure.  Today patient reports 3 episodes of palpitations with associated symptom of presyncope since hospitalization.  Last episode on 06/30/2019.  Due to recurrence of symptoms, I will have patient follow-up with cardiology prior to scheduling her for colonoscopy.  Will proceed with colonoscopy via ostomy with propofol with  Dr. Gala Romney after patient has seen cardiology.  Will also clarify with Dr. Constance Haw that patient only needs colonoscopy via ostomy and not flexible sigmoidoscopy as well. The risks, benefits, and alternatives have been discussed in detail with patient. They have stated understanding and desire to proceed.  Will also check urinalysis and culture for patients urinary burning and history of frequent UTIs.  Follow-up after procedure.   Addendum: Per Dr. Constance Haw, patient will need colonoscopy via ostomy as well as flexible sigmoidoscopy of pouch.

## 2019-07-08 LAB — URINE CULTURE
MICRO NUMBER:: 869014
SPECIMEN QUALITY:: ADEQUATE

## 2019-07-08 LAB — URINALYSIS
Bilirubin Urine: NEGATIVE
Glucose, UA: NEGATIVE
Hgb urine dipstick: NEGATIVE
Ketones, ur: NEGATIVE
Nitrite: POSITIVE — AB
Specific Gravity, Urine: 1.018 (ref 1.001–1.03)
pH: 7.5 (ref 5.0–8.0)

## 2019-07-10 ENCOUNTER — Other Ambulatory Visit: Payer: Self-pay | Admitting: Gastroenterology

## 2019-07-10 DIAGNOSIS — N390 Urinary tract infection, site not specified: Secondary | ICD-10-CM

## 2019-07-10 MED ORDER — CIPROFLOXACIN HCL 500 MG PO TABS: 500.0000 mg | ORAL_TABLET | Freq: Every day | ORAL | 0 refills | Status: AC

## 2019-07-10 NOTE — Progress Notes (Signed)
Ciprofloxacin 500 mg daily x 3 days has been sent to patients pharmacy for UTI.

## 2019-07-18 ENCOUNTER — Ambulatory Visit: Payer: Medicare Other | Admitting: Obstetrics & Gynecology

## 2019-07-24 ENCOUNTER — Encounter: Payer: Self-pay | Admitting: Cardiology

## 2019-07-24 ENCOUNTER — Ambulatory Visit (INDEPENDENT_AMBULATORY_CARE_PROVIDER_SITE_OTHER): Payer: Medicare Other | Admitting: Cardiology

## 2019-07-24 ENCOUNTER — Other Ambulatory Visit: Payer: Self-pay

## 2019-07-24 ENCOUNTER — Telehealth: Payer: Self-pay

## 2019-07-24 VITALS — BP 182/80 | HR 58 | Temp 97.6°F | Ht 64.0 in | Wt 196.0 lb

## 2019-07-24 DIAGNOSIS — I10 Essential (primary) hypertension: Secondary | ICD-10-CM

## 2019-07-24 DIAGNOSIS — I4891 Unspecified atrial fibrillation: Secondary | ICD-10-CM | POA: Diagnosis not present

## 2019-07-24 DIAGNOSIS — I9789 Other postprocedural complications and disorders of the circulatory system, not elsewhere classified: Secondary | ICD-10-CM

## 2019-07-24 DIAGNOSIS — Z01810 Encounter for preprocedural cardiovascular examination: Secondary | ICD-10-CM

## 2019-07-24 NOTE — Patient Instructions (Signed)
Medication Instructions:  Your physician recommends that you continue on your current medications as directed. Please refer to the Current Medication list given to you today.  If you need a refill on your cardiac medications before your next appointment, please call your pharmacy.   Lab work: NONE   If you have labs (blood work) drawn today and your tests are completely normal, you will receive your results only by: . MyChart Message (if you have MyChart) OR . A paper copy in the mail If you have any lab test that is abnormal or we need to change your treatment, we will call you to review the results.  Testing/Procedures: NONE   Follow-Up: At [facility], you and your health needs are our priority.  As part of our continuing mission to provide you with exceptional heart care, we have created designated Provider Care Teams.  These Care Teams include your primary Cardiologist (physician) and Advanced Practice Providers (APPs -  Physician Assistants and Nurse Practitioners) who all work together to provide you with the care you need, when you need it. You will need a follow up appointment in 6 months.  Please call our office 2 months in advance to schedule this appointment.  You may see Branch, Jonathan, MD or one of the following Advanced Practice Providers on your designated Care Team:   Brittany Strader, PA-C ([place] Office) . Michele Lenze, PA-C ([place] Office)  Any Other Special Instructions Will Be Listed Below (If Applicable). Thank you for choosing [facility]!     

## 2019-07-24 NOTE — Progress Notes (Signed)
Clinical Summary [name] is a 78 y.o.female seen today for follow up of the following medical problems.    1. Afib - new diagnosis during Jan 2020 admission - occurred postop after abdominal surgery - short duration of afib <6 hrs. Was not committed to anticoag given isolated episode postop. CHADS2Vasc score is 83 (age x2, gender, TIA x 2, HTN.  - has had some palpitations. Occurs maybe once a month. Lasts just a few minutes, some dizziness. Tends to happen with getting nervous only, no other times  2. HTN - has not taken meds yet  3. CKD 3   4. History of TIAs   5. Preoperative evaluation - history of partial colectomy with segmental resection of the proximal descending colon and end colostomy for sterocoral ulcer in January 2020.   - limited by chronic back pain - no issues with surgery in Jan Past Medical History:  Diagnosis Date  . Anxiety   . Arthritis   . Bronchitis   . CKD (chronic kidney disease) stage 3, GFR 30-59 ml/min (HCC) 05/04/2016  . COPD (chronic obstructive pulmonary disease) ([place])   . Depression   . Dysuria 03/27/2019  . Elevated LFTs 05/04/2016  . History of TIAs 1981   left side weakness  . HIV (human immunodeficiency virus infection) ([place])   . Hypertension   . Kidney disease related to HIV infection [facility])    pt states she has to have her creatinine level checked often   . Nausea 03/03/2017  . Peripheral neuropathy    bilateral feet  . Seasonal allergies 03/27/2019  . Sinusitis 05/06/2018  . UTI (urinary tract infection) 03/03/2017  . Weight loss 03/03/2017     Allergies  Allergen Reactions  . Bee Venom Anaphylaxis  . Promethazine Swelling  . Tenofovir Disoproxil Fumarate [Tenofovir Disoproxil Fumarate] Other (See Comments)    Renal failure, (08/27/2006)  . Compazine  [Prochlorperazine Edisylate] Nausea And Vomiting  . Haloperidol Lactate Other (See Comments)    Reaction is muscle tension, causes severe spasms in face and neck. Forces eyes  to roll in the back of the head  . Brompheniramine-Acetaminophen   . Chlorcyclizine   . Chlorpromazine   . Codeine Itching and Nausea And Vomiting  . Navane [Thiothixene] Other (See Comments) and Nausea And Vomiting    Same muscle spasm reaction as haldol  . Other     NOVACAINE  . Prednisone     Brief mild psychosis and agitation  . Promethazine Hcl Other (See Comments)    Same as haldol   . Propoxyphene N-Acetaminophen Itching and Nausea And Vomiting  . Morphine Itching and Swelling     Current Outpatient Medications  Medication Sig Dispense Refill  . abacavir-dolutegravir-lamiVUDine (TRIUMEQ) 600-50-300 MG tablet Take 1 tablet by mouth daily. 30 tablet 11  . albuterol (PROVENTIL HFA;VENTOLIN HFA) 108 (90 Base) MCG/ACT inhaler Inhale 1-2 puffs into the lungs every 6 (six) hours as needed for wheezing or shortness of breath. 1 Inhaler 0  . amitriptyline (ELAVIL) 50 MG tablet Take 50 mg by mouth at bedtime.     . Cholecalciferol (VITAMIN D3) 10000 units TABS Take 1 tablet by mouth daily.    . diphenhydrAMINE (BENADRYL) 25 mg capsule Take 25 mg by mouth daily as needed for itching (itching from hydrocodone).    Marland Kitchen docusate sodium (COLACE) 100 MG capsule Take 1 capsule (100 mg total) by mouth 2 (two) times daily. 10 capsule 0  . fluocinonide-emollient (LIDEX-E) 0.05 % cream Apply  1 application topically 2 (two) times daily. 30 g 0  . fluticasone (FLONASE) 50 MCG/ACT nasal spray 2 SPRAYS INTO BOTH NOSTRILS AT BEDTIME. (Patient taking differently: Place 2 sprays into both nostrils at bedtime. ) 16 g 5  . furosemide (LASIX) 40 MG tablet Take 40 mg by mouth daily as needed for fluid.     Marland Kitchen ipratropium-albuterol (DUONEB) 0.5-2.5 (3) MG/3ML SOLN Inhale 3 mLs into the lungs every 6 (six) hours as needed (shortness of breath).     . LORazepam (ATIVAN) 0.5 MG tablet Take 0.5 mg by mouth 2 (two) times daily.     . magnesium 30 MG tablet Take 30 mg by mouth at bedtime.     . Multiple Vitamin  (ONE-A-DAY 55 PLUS PO) Take 1 tablet by mouth daily.    . nebivolol (BYSTOLIC) 10 MG tablet Take 10 mg by mouth daily.     . ondansetron (ZOFRAN) 8 MG tablet Take 8 mg by mouth daily as needed for nausea or vomiting.     . polyvinyl alcohol (LIQUIFILM TEARS) 1.4 % ophthalmic solution Place 1 drop into both eyes as needed for dry eyes.    . Psyllium (METAMUCIL PO) Take by mouth. 1 tsp twice daily    . solifenacin (VESICARE) 10 MG tablet Take 1 tablet (10 mg total) by mouth daily. 30 tablet 11  . thiamine (VITAMIN B-1) 100 MG tablet Take 100 mg by mouth daily.     No current facility-administered medications for this visit.      Past Surgical History:  Procedure Laterality Date  . ABDOMINAL HYSTERECTOMY  1985  . BACK SURGERY    . CATARACT EXTRACTION, BILATERAL    . CHOLECYSTECTOMY    . COLONOSCOPY  08/10/2002   NUR: Normal colonoscopy except for some hemorrhoids and some erythema at the dentate line  . COLONOSCOPY  12/10/2006   [place]:Diffuse colitis in the transverse and descending colon/This is not entirely typical of ischemic colitis or her HIV positive/ disease.  We need to be concerned about other causes  . COLONOSCOPY  01/19/2011   RMR: Internal and external hemorrhoids, likely source of hematochezia anal papilla, otherwise normal rectal mucosa/ Left-sided diverticula.  Cecal polyp with status post cold snare polypectomy.  Remainder of colonic mucosa appeared normal  . COLOSTOMY N/A 11/12/2018   Procedure: COLOSTOMY;  Surgeon: Virl Cagey, MD;  Location: [facility];  Service: General;  Laterality: N/A;  . ESOPHAGOGASTRODUODENOSCOPY  08/10/2002     NUR: Mild changes of reflux esophagitis limited to gastroesophageal junction  No evidence of ring, stricture, or esophageal candidiasis dysphagia/ Gastritis, possibly H. pylori induced  . ESOPHAGOGASTRODUODENOSCOPY  12/25/2002   NUR: Erosive antral gastritis.  The degree of gastritis is more significant than her last exam/ Normal  examination of the esophagus/ Esophageal dilatation performed by passing 42 and [address] [place]  . ESOPHAGOGASTRODUODENOSCOPY  11/13/2010   RMR: Circumferential distal esophageal erosions with soft peptic stricture, consistent with erosive reflux esophagitis or stricture  formation, status post dilation as described above/  Small hiatal hernia/ Tiny antral erosions, otherwise normal stomach D1 and D2  . HEMORRHOID SURGERY  09/02/2012   Procedure: HEMORRHOIDECTOMY;  Surgeon: Jamesetta So, MD;  Location: [facility];  Service: General;  Laterality: N/A;  . LAPAROTOMY N/A 11/12/2018   Procedure: EXPLORATORY LAPAROTOMY;  Surgeon: Virl Cagey, MD;  Location: [facility];  Service: General;  Laterality: N/A;  . PARTIAL COLECTOMY N/A 11/12/2018   Procedure: PARTIAL COLECTOMY;  Surgeon: Virl Cagey,  MD;  Location: [facility];  Service: General;  Laterality: N/A;  . TUBAL LIGATION       Allergies  Allergen Reactions  . Bee Venom Anaphylaxis  . Promethazine Swelling  . Tenofovir Disoproxil Fumarate [Tenofovir Disoproxil Fumarate] Other (See Comments)    Renal failure, (08/27/2006)  . Compazine  [Prochlorperazine Edisylate] Nausea And Vomiting  . Haloperidol Lactate Other (See Comments)    Reaction is muscle tension, causes severe spasms in face and neck. Forces eyes to roll in the back of the head  . Brompheniramine-Acetaminophen   . Chlorcyclizine   . Chlorpromazine   . Codeine Itching and Nausea And Vomiting  . Navane [Thiothixene] Other (See Comments) and Nausea And Vomiting    Same muscle spasm reaction as haldol  . Other     NOVACAINE  . Prednisone     Brief mild psychosis and agitation  . Promethazine Hcl Other (See Comments)    Same as haldol   . Propoxyphene N-Acetaminophen Itching and Nausea And Vomiting  . Morphine Itching and Swelling      Family History  Problem Relation Age of Onset  . Diabetes Mother   . Diabetes Brother   . Colon cancer Brother        Diagnosed > age  61     Social History [name] reports that she has never smoked. She has never used smokeless tobacco. [name] reports no history of alcohol use.   Review of Systems CONSTITUTIONAL: No weight loss, fever, chills, weakness or fatigue.  HEENT: Eyes: No visual loss, blurred vision, double vision or yellow sclerae.No hearing loss, sneezing, congestion, runny nose or sore throat.  SKIN: No rash or itching.  CARDIOVASCULAR: per hpi RESPIRATORY: No shortness of breath, cough or sputum.  GASTROINTESTINAL: No anorexia, nausea, vomiting or diarrhea. No abdominal pain or blood.  GENITOURINARY: No burning on urination, no polyuria NEUROLOGICAL: No headache, dizziness, syncope, paralysis, ataxia, numbness or tingling in the extremities. No change in bowel or bladder control.  MUSCULOSKELETAL: No muscle, back pain, joint pain or stiffness.  LYMPHATICS: No enlarged nodes. No history of splenectomy.  PSYCHIATRIC: No history of depression or anxiety.  ENDOCRINOLOGIC: No reports of sweating, cold or heat intolerance. No polyuria or polydipsia.  Marland Kitchen   Physical Examination Today's Vitals   07/24/19 1306  BP: (!) 182/80  Pulse: (!) 58  Temp: 97.6 F (36.4 C)  SpO2: 97%  Weight: 196 lb (88.9 kg)  Height: 5\' 4"  (1.626 m)   Body mass index is 33.64 kg/m.  Gen: resting comfortably, no acute distress HEENT: no scleral icterus, pupils equal round and reactive, no palptable cervical adenopathy,  CV: RRR, no m/r/g, no jvd Resp: Clear to auscultation bilaterally GI: abdomen is soft, non-tender, non-distended, normal bowel sounds, no hepatosplenomegaly MSK: extremities are warm, no edema.  Skin: warm, no rash Neuro:  no focal deficits Psych: appropriate affect   Diagnostic Studies  Jan 2020 echo Study Conclusions  - Left ventricle: The cavity size was normal. Wall thickness was   increased in a pattern of moderate LVH. Systolic function was   normal. The estimated ejection fraction  was in the range of 60%   to 65%. Wall motion was normal; there were no regional wall   motion abnormalities. Doppler parameters are consistent with   abnormal left ventricular relaxation (grade 1 diastolic   dysfunction). - Aortic valve: Mildly calcified annulus. Trileaflet; mildly   thickened leaflets. Valve area (VTI): 2.67 cm^2. Valve area   (Vmax): 2.36  cm^2. - Mitral valve: Mildly calcified annulus. Mildly thickened leaflets    Assessment and Plan  1. Postop Afib - isolated episode postop short in duration, no recurrence. She was very symptomatic when occurred in the hospital, no similar symptoms since. Only rare palpitatoins that come on with nerves or anxiety recently - continue to monitor at this time  2. HTN - elevated today but has not taken meds yet. BP from visit earlier in the month with GI was reasonable   3. Preoperative evaluation - no active cardopumlonary symptoms - tolerated major surgetry in January aside from isolated afib episode - recommend proceeding with colostomy revision as planned, no further cardiac testing warranted    F/u 6 months   Arnoldo Lenis, M.D.,

## 2019-07-24 NOTE — Telephone Encounter (Signed)
Pt's daughter-in-law Ander Slade [phone]) called office, pt seen cardiology today and was given clearance for TCS.  Routing to [facility] to advise about TCS.

## 2019-08-01 ENCOUNTER — Ambulatory Visit: Payer: Medicare Other | Admitting: Obstetrics & Gynecology

## 2019-08-10 ENCOUNTER — Encounter: Payer: Self-pay | Admitting: Gastroenterology

## 2019-08-10 NOTE — Telephone Encounter (Signed)
Noted. Reviewed note from patient's recent cardiology visit.  Patient okay to proceed with colonoscopy with propofol with Dr. Gala Romney.  She needs colonoscopy via ostomy and flexible sigmoidoscopy. Dx: History of adenomatous colon polyp and history of partial colectomy.

## 2019-08-11 NOTE — Telephone Encounter (Signed)
Tried to call daughter-in-law [name]), no answer, [facility] for return call.

## 2019-08-18 NOTE — Telephone Encounter (Signed)
Letter mailed

## 2019-08-22 ENCOUNTER — Telehealth: Payer: Self-pay

## 2019-08-22 NOTE — Telephone Encounter (Signed)
Received a request for ostomy supplies. Pt was last seen by Aliene Altes, [place] on 07/06/2019.  Placing the form in her box to address.

## 2019-08-23 NOTE — Telephone Encounter (Signed)
Called, many rings and no answer.

## 2019-08-23 NOTE — Telephone Encounter (Signed)
Typically patients receive supplies through PCP. Can you call patient to see if she is getting supplies through her PCP?

## 2019-08-24 NOTE — Telephone Encounter (Signed)
Noted  

## 2019-08-24 NOTE — Telephone Encounter (Signed)
I contacted Denzil [facility] [phone]) and spoke with Val.  She is making a note that the request for supplies should not be sent to our office and they will make the necessary correction.

## 2019-08-24 NOTE — Telephone Encounter (Signed)
Doris, you could also contact [employer] and let them know the surgeon, Dr. Constance Haw, or her PCP will need to be the one who manages the supplies that way they will send the requests to the appropriate provider.

## 2019-08-24 NOTE — Telephone Encounter (Signed)
Called and could not leave a VM.

## 2019-12-26 DIAGNOSIS — N390 Urinary tract infection, site not specified: Secondary | ICD-10-CM | POA: Diagnosis not present

## 2019-12-29 ENCOUNTER — Inpatient Hospital Stay (HOSPITAL_COMMUNITY)
Admission: EM | Admit: 2019-12-29 | Discharge: 2019-12-31 | DRG: 690 | Disposition: A | Discharge status: Home / Self Care | Payer: Medicare HMO | Attending: Internal Medicine | Admitting: Internal Medicine

## 2019-12-29 ENCOUNTER — Emergency Department (HOSPITAL_COMMUNITY): Payer: Medicare HMO

## 2019-12-29 ENCOUNTER — Encounter (HOSPITAL_COMMUNITY): Payer: Self-pay

## 2019-12-29 ENCOUNTER — Other Ambulatory Visit: Payer: Self-pay

## 2019-12-29 DIAGNOSIS — F329 Major depressive disorder, single episode, unspecified: Secondary | ICD-10-CM | POA: Diagnosis present

## 2019-12-29 DIAGNOSIS — Z20822 Contact with and (suspected) exposure to covid-19: Secondary | ICD-10-CM | POA: Diagnosis not present

## 2019-12-29 DIAGNOSIS — R001 Bradycardia, unspecified: Secondary | ICD-10-CM | POA: Diagnosis not present

## 2019-12-29 DIAGNOSIS — R5381 Other malaise: Secondary | ICD-10-CM | POA: Diagnosis not present

## 2019-12-29 DIAGNOSIS — J449 Chronic obstructive pulmonary disease, unspecified: Secondary | ICD-10-CM | POA: Diagnosis present

## 2019-12-29 DIAGNOSIS — Z800 Family history of malignant neoplasm of digestive organs: Secondary | ICD-10-CM | POA: Diagnosis not present

## 2019-12-29 DIAGNOSIS — K5730 Diverticulosis of large intestine without perforation or abscess without bleeding: Secondary | ICD-10-CM | POA: Diagnosis not present

## 2019-12-29 DIAGNOSIS — Z885 Allergy status to narcotic agent status: Secondary | ICD-10-CM | POA: Diagnosis not present

## 2019-12-29 DIAGNOSIS — G629 Polyneuropathy, unspecified: Secondary | ICD-10-CM | POA: Diagnosis present

## 2019-12-29 DIAGNOSIS — N3090 Cystitis, unspecified without hematuria: Secondary | ICD-10-CM | POA: Diagnosis not present

## 2019-12-29 DIAGNOSIS — B20 Human immunodeficiency virus [HIV] disease: Secondary | ICD-10-CM | POA: Diagnosis present

## 2019-12-29 DIAGNOSIS — I129 Hypertensive chronic kidney disease with stage 1 through stage 4 chronic kidney disease, or unspecified chronic kidney disease: Secondary | ICD-10-CM | POA: Diagnosis present

## 2019-12-29 DIAGNOSIS — E039 Hypothyroidism, unspecified: Secondary | ICD-10-CM | POA: Diagnosis present

## 2019-12-29 DIAGNOSIS — Z03818 Encounter for observation for suspected exposure to other biological agents ruled out: Secondary | ICD-10-CM | POA: Diagnosis not present

## 2019-12-29 DIAGNOSIS — N390 Urinary tract infection, site not specified: Secondary | ICD-10-CM | POA: Diagnosis not present

## 2019-12-29 DIAGNOSIS — Z79899 Other long term (current) drug therapy: Secondary | ICD-10-CM

## 2019-12-29 DIAGNOSIS — Z8673 Personal history of transient ischemic attack (TIA), and cerebral infarction without residual deficits: Secondary | ICD-10-CM

## 2019-12-29 DIAGNOSIS — Z86010 Personal history of colonic polyps: Secondary | ICD-10-CM | POA: Diagnosis not present

## 2019-12-29 DIAGNOSIS — F418 Other specified anxiety disorders: Secondary | ICD-10-CM | POA: Diagnosis not present

## 2019-12-29 DIAGNOSIS — Z888 Allergy status to other drugs, medicaments and biological substances status: Secondary | ICD-10-CM

## 2019-12-29 DIAGNOSIS — Z90710 Acquired absence of both cervix and uterus: Secondary | ICD-10-CM | POA: Diagnosis not present

## 2019-12-29 DIAGNOSIS — N179 Acute kidney failure, unspecified: Secondary | ICD-10-CM | POA: Diagnosis not present

## 2019-12-29 DIAGNOSIS — Z743 Need for continuous supervision: Secondary | ICD-10-CM | POA: Diagnosis not present

## 2019-12-29 DIAGNOSIS — N3000 Acute cystitis without hematuria: Secondary | ICD-10-CM | POA: Diagnosis not present

## 2019-12-29 DIAGNOSIS — Z833 Family history of diabetes mellitus: Secondary | ICD-10-CM

## 2019-12-29 DIAGNOSIS — M5137 Other intervertebral disc degeneration, lumbosacral region: Secondary | ICD-10-CM | POA: Diagnosis not present

## 2019-12-29 DIAGNOSIS — Z91030 Bee allergy status: Secondary | ICD-10-CM

## 2019-12-29 DIAGNOSIS — N898 Other specified noninflammatory disorders of vagina: Secondary | ICD-10-CM | POA: Diagnosis present

## 2019-12-29 DIAGNOSIS — N1832 Chronic kidney disease, stage 3b: Secondary | ICD-10-CM | POA: Diagnosis present

## 2019-12-29 DIAGNOSIS — Z9049 Acquired absence of other specified parts of digestive tract: Secondary | ICD-10-CM | POA: Diagnosis not present

## 2019-12-29 DIAGNOSIS — Z933 Colostomy status: Secondary | ICD-10-CM

## 2019-12-29 DIAGNOSIS — I10 Essential (primary) hypertension: Secondary | ICD-10-CM | POA: Diagnosis not present

## 2019-12-29 DIAGNOSIS — E860 Dehydration: Secondary | ICD-10-CM | POA: Diagnosis present

## 2019-12-29 DIAGNOSIS — R102 Pelvic and perineal pain: Secondary | ICD-10-CM | POA: Diagnosis not present

## 2019-12-29 DIAGNOSIS — R52 Pain, unspecified: Secondary | ICD-10-CM | POA: Diagnosis not present

## 2019-12-29 DIAGNOSIS — Z6832 Body mass index (BMI) 32.0-32.9, adult: Secondary | ICD-10-CM

## 2019-12-29 DIAGNOSIS — F419 Anxiety disorder, unspecified: Secondary | ICD-10-CM | POA: Diagnosis present

## 2019-12-29 DIAGNOSIS — K429 Umbilical hernia without obstruction or gangrene: Secondary | ICD-10-CM | POA: Diagnosis not present

## 2019-12-29 DIAGNOSIS — E669 Obesity, unspecified: Secondary | ICD-10-CM | POA: Diagnosis present

## 2019-12-29 DIAGNOSIS — Z21 Asymptomatic human immunodeficiency virus [HIV] infection status: Secondary | ICD-10-CM

## 2019-12-29 LAB — COMPREHENSIVE METABOLIC PANEL
ALT: 24 U/L (ref 0–44)
AST: 26 U/L (ref 15–41)
Albumin: 4.5 g/dL (ref 3.5–5.0)
Alkaline Phosphatase: 63 U/L (ref 38–126)
Anion gap: 7 (ref 5–15)
BUN: 27 mg/dL — ABNORMAL HIGH (ref 8–23)
CO2: 27 mmol/L (ref 22–32)
Calcium: 10.2 mg/dL (ref 8.9–10.3)
Chloride: 107 mmol/L (ref 98–111)
Creatinine, Ser: 1.73 mg/dL — ABNORMAL HIGH (ref 0.44–1.00)
GFR calc Af Amer: 32 mL/min — ABNORMAL LOW (ref >60)
GFR calc non Af Amer: 28 mL/min — ABNORMAL LOW (ref >60)
Glucose, Bld: 101 mg/dL — ABNORMAL HIGH (ref 70–99)
Potassium: 4.9 mmol/L (ref 3.5–5.1)
Sodium: 141 mmol/L (ref 135–145)
Total Bilirubin: 1 mg/dL (ref 0.3–1.2)
Total Protein: 7.2 g/dL (ref 6.5–8.1)

## 2019-12-29 LAB — URINALYSIS, ROUTINE W REFLEX MICROSCOPIC
Bilirubin Urine: NEGATIVE
Glucose, UA: NEGATIVE mg/dL
Hgb urine dipstick: NEGATIVE
Ketones, ur: NEGATIVE mg/dL
Nitrite: POSITIVE — AB
Protein, ur: 100 mg/dL — AB
Specific Gravity, Urine: 1.018 (ref 1.005–1.030)
WBC, UA: >50 WBC/hpf — ABNORMAL HIGH (ref 0–5)
pH: 7 (ref 5.0–8.0)

## 2019-12-29 LAB — CBC WITH DIFFERENTIAL/PLATELET
Abs Immature Granulocytes: 0.01 10*3/uL (ref 0.00–0.07)
Basophils Absolute: 0 10*3/uL (ref 0.0–0.1)
Basophils Relative: 0 %
Eosinophils Absolute: 0.2 10*3/uL (ref 0.0–0.5)
Eosinophils Relative: 3 %
HCT: 42.5 % (ref 36.0–46.0)
Hemoglobin: 14.2 g/dL (ref 12.0–15.0)
Immature Granulocytes: 0 %
Lymphocytes Relative: 41 %
Lymphs Abs: 2.8 10*3/uL (ref 0.7–4.0)
MCH: 34.4 pg — ABNORMAL HIGH (ref 26.0–34.0)
MCHC: 33.4 g/dL (ref 30.0–36.0)
MCV: 102.9 fL — ABNORMAL HIGH (ref 80.0–100.0)
Monocytes Absolute: 0.6 10*3/uL (ref 0.1–1.0)
Monocytes Relative: 9 %
Neutro Abs: 3.3 10*3/uL (ref 1.7–7.7)
Neutrophils Relative %: 47 %
Platelets: 159 10*3/uL (ref 150–400)
RBC: 4.13 MIL/uL (ref 3.87–5.11)
RDW: 12.7 % (ref 11.5–15.5)
WBC: 7 10*3/uL (ref 4.0–10.5)
nRBC: 0 % (ref 0.0–0.2)

## 2019-12-29 LAB — WET PREP, GENITAL
Clue Cells Wet Prep HPF POC: NONE SEEN
Sperm: NONE SEEN
Trich, Wet Prep: NONE SEEN
Yeast Wet Prep HPF POC: NONE SEEN

## 2019-12-29 MED ORDER — FENTANYL CITRATE (PF) 100 MCG/2ML IJ SOLN
50.0000 ug | Freq: Once | INTRAMUSCULAR | Status: AC
  Administered 2019-12-29: 50 ug via INTRAVENOUS
  Filled 2019-12-29: qty 2

## 2019-12-29 MED ORDER — IOHEXOL 9 MG/ML PO SOLN
ORAL | Status: AC
  Filled 2019-12-29: qty 1000

## 2019-12-29 MED ORDER — ONDANSETRON HCL 4 MG/2ML IJ SOLN
4.0000 mg | Freq: Once | INTRAMUSCULAR | Status: AC
  Administered 2019-12-29: 4 mg via INTRAVENOUS
  Filled 2019-12-29: qty 2

## 2019-12-29 MED ORDER — SODIUM CHLORIDE 0.9 % IV SOLN
1.0000 g | Freq: Once | INTRAVENOUS | Status: AC
  Administered 2019-12-29: 1 g via INTRAVENOUS
  Filled 2019-12-29: qty 10

## 2019-12-29 NOTE — ED Notes (Signed)
CT requesting patient to drink one bottle now and one bottle at 730 and CT will be back at 830 to take her to CT.

## 2019-12-29 NOTE — ED Triage Notes (Signed)
EMS reports pt was diagnosed with UTI Monday and started antibiotics.  C/O pelvic pain and brown vaginal discharge.  Pt had virtual visit with pcp today and was told to come to ed.

## 2019-12-29 NOTE — ED Notes (Signed)
Patient completed oral contrast at 30

## 2019-12-29 NOTE — H&P (Addendum)
TRH H&P    Patient Demographics:    [name], is a 79 y.o. female  MRN: [id number]  DOB - 06/07/1941  Admit Date - 12/29/2019  Referring MD/NP/PA: Kennith Maes  Outpatient Primary MD for the patient is System, Pcp Not In  Patient coming from: Home  Chief complaint-abdominal pain   HPI:    [name]  is a 79 y.o. female, with history of COPD, CKD stage III, anxiety, HIV, last viral load undetectable, IBS, prior bowel obstruction, bowel perforation s/p partial colectomy with colostomy in place, cholecystectomy, abdominal hysterectomy and tubal ligation came to ED with complaints of dysuria and abdominal pain for past 1 week.  Patient said that she has been having dysuria, urgency frequency and intermittent lower abdominal pain with back pain for past 1 week.  She saw her PCP 1 week prior who started her on antibiotics for UTI.  She has been taking antibiotics for just less than a week without any significant improvement.  She called her PCP who told her to come to the ED for further evaluation.  Denies vomiting but complains of nausea.  Denies fever but has chills. In the ED she was found to have abnormal UA, started on IV ceftriaxone. CT abdomen and pelvis was done which showed no perinephric stranding to indicate pyelonephritis.  It showed diffuse urinary bladder wall thickening with prominent perivascular fat stranding suspicious for cystitis.    Review of systems:    In addition to the HPI above,    All other systems reviewed and are negative.    Past History of the following :    Past Medical History:  Diagnosis Date  . Anxiety   . Arthritis   . Bronchitis   . CKD (chronic kidney disease) stage 3, GFR 30-59 ml/min 05/04/2016  . COPD (chronic obstructive pulmonary disease) ([place])   . Depression   . Dysuria 03/27/2019  . Elevated LFTs 05/04/2016  . History of TIAs 1981   left side  weakness  . HIV (human immunodeficiency virus infection) ([place])   . Hypertension   . Kidney disease related to HIV infection [facility])    pt states she has to have her creatinine level checked often   . Nausea 03/03/2017  . Peripheral neuropathy    bilateral feet  . Seasonal allergies 03/27/2019  . Sinusitis 05/06/2018  . UTI (urinary tract infection) 03/03/2017  . Weight loss 03/03/2017      Past Surgical History:  Procedure Laterality Date  . ABDOMINAL HYSTERECTOMY  1985  . BACK SURGERY    . CATARACT EXTRACTION, BILATERAL    . CHOLECYSTECTOMY    . COLONOSCOPY  08/10/2002   NUR: Normal colonoscopy except for some hemorrhoids and some erythema at the dentate line  . COLONOSCOPY  12/10/2006   [place]:Diffuse colitis in the transverse and descending colon/This is not entirely typical of ischemic colitis or her HIV positive/ disease.  We need to be concerned about other causes  . COLONOSCOPY  01/19/2011   RMR: Internal and external hemorrhoids, likely source of hematochezia anal  papilla, otherwise normal rectal mucosa/ Left-sided diverticula.  Cecal polyp with status post cold snare polypectomy.  Remainder of colonic mucosa appeared normal. Pathology with tubular adenoma. Repeat in 2017  . COLOSTOMY N/A 11/12/2018   Procedure: COLOSTOMY;  Surgeon: Virl Cagey, MD;  Location: [facility];  Service: General;  Laterality: N/A;  . ESOPHAGOGASTRODUODENOSCOPY  08/10/2002     NUR: Mild changes of reflux esophagitis limited to gastroesophageal junction  No evidence of ring, stricture, or esophageal candidiasis dysphagia/ Gastritis, possibly H. pylori induced  . ESOPHAGOGASTRODUODENOSCOPY  12/25/2002   NUR: Erosive antral gastritis.  The degree of gastritis is more significant than her last exam/ Normal examination of the esophagus/ Esophageal dilatation performed by passing 38 and [address] [place]  . ESOPHAGOGASTRODUODENOSCOPY  11/13/2010   RMR: Circumferential distal esophageal erosions with soft peptic  stricture, consistent with erosive reflux esophagitis or stricture  formation, status post dilation as described above/  Small hiatal hernia/ Tiny antral erosions, otherwise normal stomach D1 and D2  . HEMORRHOID SURGERY  09/02/2012   Procedure: HEMORRHOIDECTOMY;  Surgeon: Jamesetta So, MD;  Location: [facility];  Service: General;  Laterality: N/A;  . LAPAROTOMY N/A 11/12/2018   Procedure: EXPLORATORY LAPAROTOMY;  Surgeon: Virl Cagey, MD;  Location: [facility];  Service: General;  Laterality: N/A;  . PARTIAL COLECTOMY N/A 11/12/2018   Procedure: PARTIAL COLECTOMY;  Surgeon: Virl Cagey, MD;  Location: [facility];  Service: General;  Laterality: N/A;  . TUBAL LIGATION        Social History:      Social History   Tobacco Use  . Smoking status: Never Smoker  . Smokeless tobacco: Never Used  Substance Use Topics  . Alcohol use: No       Family History :     Family History  Problem Relation Age of Onset  . Diabetes Mother   . Diabetes Brother   . Colon cancer Brother        Diagnosed > age 12      Home Medications:   Prior to Admission medications   Medication Sig Start Date End Date Taking? Authorizing Provider  abacavir-dolutegravir-lamiVUDine (TRIUMEQ) 600-50-300 MG tablet Take 1 tablet by mouth daily. 03/27/19   Truman Hayward, MD  albuterol (PROVENTIL HFA;VENTOLIN HFA) [phone] Base) MCG/ACT inhaler Inhale 1-2 puffs into the lungs every 6 (six) hours as needed for wheezing or shortness of breath. 04/02/17   Nat Christen, MD  amitriptyline (ELAVIL) 50 MG tablet Take 50 mg by mouth at bedtime.     [provider]  Cholecalciferol (VITAMIN D3) 10000 units TABS Take 1 tablet by mouth daily.    [provider]  diphenhydrAMINE (BENADRYL) 25 mg capsule Take 25 mg by mouth daily as needed for itching (itching from hydrocodone).    [provider]  docusate sodium (COLACE) 100 MG capsule Take 1 capsule (100 mg total) by mouth 2 (two) times daily.  11/21/18   Virl Cagey, MD  fluocinonide-emollient (LIDEX-E) 0.05 % cream Apply 1 application topically 2 (two) times daily. 05/23/19   Florian Buff, MD  fluticasone (FLONASE) 50 MCG/ACT nasal spray 2 SPRAYS INTO BOTH NOSTRILS AT BEDTIME. Patient taking differently: Place 2 sprays into both nostrils at bedtime.  05/22/19   Truman Hayward, MD  furosemide (LASIX) 40 MG tablet Take 40 mg by mouth daily as needed for fluid.     [provider]  ipratropium-albuterol (DUONEB) 0.5-2.5 (3) MG/3ML SOLN Inhale 3 mLs  into the lungs every 6 (six) hours as needed (shortness of breath).  04/02/17   [provider]  LORazepam (ATIVAN) 0.5 MG tablet Take 0.5 mg by mouth 2 (two) times daily.  11/01/18   [provider]  magnesium 30 MG tablet Take 30 mg by mouth at bedtime.     [provider]  Multiple Vitamin (ONE-A-DAY 55 PLUS PO) Take 1 tablet by mouth daily.    [provider]  nebivolol (BYSTOLIC) 10 MG tablet Take 10 mg by mouth daily.     [provider]  nitrofurantoin, macrocrystal-monohydrate, (MACROBID) 100 MG capsule Take 100 mg by mouth 2 (two) times daily. 12/26/19   [provider]  ondansetron (ZOFRAN) 8 MG tablet Take 8 mg by mouth daily as needed for nausea or vomiting.     [provider]  phenazopyridine (PYRIDIUM) 200 MG tablet Take 200 mg by mouth 3 (three) times daily. 12/26/19   [provider]  polyvinyl alcohol (LIQUIFILM TEARS) 1.4 % ophthalmic solution Place 1 drop into both eyes as needed for dry eyes.    [provider]  Psyllium (METAMUCIL PO) Take by mouth. 1 tsp twice daily    [provider]  solifenacin (VESICARE) 10 MG tablet Take 1 tablet (10 mg total) by mouth daily. 05/23/19   Florian Buff, MD  thiamine (VITAMIN B-1) 100 MG tablet Take 100 mg by mouth daily.    [provider]     Allergies:     Allergies  Allergen Reactions  . Bee Venom Anaphylaxis  .  Promethazine Swelling  . Tenofovir Disoproxil Fumarate [Tenofovir Disoproxil Fumarate] Other (See Comments)    Renal failure, (08/27/2006)  . Compazine  [Prochlorperazine Edisylate] Nausea And Vomiting  . Haloperidol Lactate Other (See Comments)    Reaction is muscle tension, causes severe spasms in face and neck. Forces eyes to roll in the back of the head  . Brompheniramine-Acetaminophen   . Chlorcyclizine   . Chlorpromazine   . Codeine Itching and Nausea And Vomiting  . Navane [Thiothixene] Other (See Comments) and Nausea And Vomiting    Same muscle spasm reaction as haldol  . Other     NOVACAINE  . Prednisone     Brief mild psychosis and agitation  . Promethazine Hcl Other (See Comments)    Same as haldol   . Propoxyphene N-Acetaminophen Itching and Nausea And Vomiting  . Morphine Itching and Swelling     Physical Exam:   Vitals  Blood pressure (!) 192/70, pulse (!) 52, temperature 97.8 F (36.6 C), temperature source Oral, resp. rate 18, height 5\' 4"  (1.626 m), weight 83.9 kg, SpO2 99 %.  1.  General: Appears in no acute distress  2. Psychiatric: Alert, oriented x3, intact insight and judgment  3. Neurologic: Cranial nerves II through XII grossly intact, no focal deficit noted  4. HEENMT:  Atraumatic normocephalic, extraocular muscles are intact  5. Respiratory : Clear to auscultation bilaterally, no wheezing or crackles auscultated  6. Cardiovascular : S1-S2, regular, no murmur auscultated, no edema noted in the lower extremities  7. Gastrointestinal:  Abdomen is soft, nontender, no organomegaly, colostomy in place below umbilicus      Data Review:    CBC Recent Labs  Lab 12/29/19 1730  WBC 7.0  HGB 14.2  HCT 42.5  PLT 159  MCV 102.9*  MCH 34.4*  MCHC 33.4  RDW 12.7  LYMPHSABS 2.8  MONOABS 0.6  EOSABS 0.2  BASOSABS 0.0    ------------------------------------------------------------------------------------------------------------------  Results for orders placed or performed during the hospital encounter of 12/29/19 (from the past 48 hour(s))  Comprehensive metabolic panel     Status: Abnormal   Collection Time: 12/29/19  5:30 PM  Result Value Ref Range   Sodium 141 135 - 145 mmol/L   Potassium 4.9 3.5 - 5.1 mmol/L   Chloride 107 98 - 111 mmol/L   CO2 27 22 - 32 mmol/L   Glucose, Bld 101 (H) 70 - 99 mg/dL    Comment: Glucose reference range applies only to samples taken after fasting for at least 8 hours.   BUN 27 (H) 8 - 23 mg/dL   Creatinine, Ser 1.73 (H) 0.44 - 1.00 mg/dL   Calcium 10.2 8.9 - 10.3 mg/dL   Total Protein 7.2 6.5 - 8.1 g/dL   Albumin 4.5 3.5 - 5.0 g/dL   AST 26 15 - 41 U/L   ALT 24 0 - 44 U/L   Alkaline Phosphatase 63 38 - 126 U/L   Total Bilirubin 1.0 0.3 - 1.2 mg/dL   GFR calc non Af Amer 28 (L) >60 mL/min   GFR calc Af Amer 32 (L) >60 mL/min   Anion gap 7 5 - 15    Comment: Performed at [facility], [address], [place], [place] [address]  CBC with Differential     Status: Abnormal   Collection Time: 12/29/19  5:30 PM  Result Value Ref Range   WBC 7.0 4.0 - 10.5 K/uL   RBC 4.13 3.87 - 5.11 MIL/uL   Hemoglobin 14.2 12.0 - 15.0 g/dL   HCT 42.5 36.0 - 46.0 %   MCV 102.9 (H) 80.0 - 100.0 fL   MCH 34.4 (H) 26.0 - 34.0 pg   MCHC 33.4 30.0 - 36.0 g/dL   RDW 12.7 11.5 - 15.5 %   Platelets 159 150 - 400 K/uL   nRBC 0.0 0.0 - 0.2 %   Neutrophils Relative % 47 %   Neutro Abs 3.3 1.7 - 7.7 K/uL   Lymphocytes Relative 41 %   Lymphs Abs 2.8 0.7 - 4.0 K/uL   Monocytes Relative 9 %   Monocytes Absolute 0.6 0.1 - 1.0 K/uL   Eosinophils Relative 3 %   Eosinophils Absolute 0.2 0.0 - 0.5 K/uL   Basophils Relative 0 %   Basophils Absolute 0.0 0.0 - 0.1 K/uL   Immature Granulocytes 0 %   Abs Immature Granulocytes 0.01 0.00 - 0.07 K/uL    Comment: Performed at [facility],  [address], [place], [place] [address]  Urinalysis, Routine w reflex microscopic     Status: Abnormal   Collection Time: 12/29/19  5:46 PM  Result Value Ref Range   Color, Urine YELLOW YELLOW   APPearance HAZY (A) CLEAR   Specific Gravity, Urine 1.018 1.005 - 1.030   pH 7.0 5.0 - 8.0   Glucose, UA NEGATIVE NEGATIVE mg/dL   Hgb urine dipstick NEGATIVE NEGATIVE   Bilirubin Urine NEGATIVE NEGATIVE   Ketones, ur NEGATIVE NEGATIVE mg/dL   Protein, ur 100 (A) NEGATIVE mg/dL   Nitrite POSITIVE (A) NEGATIVE   Leukocytes,Ua MODERATE (A) NEGATIVE   RBC / HPF 6-10 0 - 5 RBC/hpf   WBC, UA >50 (H) 0 - 5 WBC/hpf   Bacteria, UA RARE (A) NONE SEEN   Squamous Epithelial / LPF 0-5 0 - 5    Comment: Performed at [facility], [address], [place], [place] [address]  Wet prep, genital     Status: Abnormal  Collection Time: 12/29/19  7:45 PM  Result Value Ref Range   Yeast Wet Prep HPF POC NONE SEEN NONE SEEN   Trich, Wet Prep NONE SEEN NONE SEEN   Clue Cells Wet Prep HPF POC NONE SEEN NONE SEEN   WBC, Wet Prep HPF POC FEW (A) NONE SEEN   Sperm NONE SEEN     Comment: Performed at [facility], [address], [place], [place] [address]    Chemistries  Recent Labs  Lab 12/29/19 1730  NA 141  K 4.9  CL 107  CO2 27  GLUCOSE 101*  BUN 27*  CREATININE 1.73*  CALCIUM 10.2  AST 26  ALT 24  ALKPHOS 63  BILITOT 1.0   ------------------------------------------------------------------------------------------------------------------  ------------------------------------------------------------------------------------------------------------------ GFR: Estimated Creatinine Clearance: 28.1 mL/min (A) (by C-G formula based on SCr of 1.73 mg/dL (H)). Liver Function Tests: Recent Labs  Lab 12/29/19 1730  AST 26  ALT 24  ALKPHOS 63  BILITOT 1.0  PROT 7.2  ALBUMIN 4.5    --------------------------------------------------------------------------------------------------------------- Urine  analysis:    Component Value Date/Time   COLORURINE YELLOW 12/29/2019 1746   APPEARANCEUR HAZY (A) 12/29/2019 1746   LABSPEC 1.018 12/29/2019 1746   PHURINE 7.0 12/29/2019 1746   GLUCOSEU NEGATIVE 12/29/2019 1746   GLUCOSEU NEG mg/dL 09/20/2007 1114   HGBUR NEGATIVE 12/29/2019 1746   HGBUR moderate 06/04/2010 1425   BILIRUBINUR NEGATIVE 12/29/2019 1746   KETONESUR NEGATIVE 12/29/2019 1746   PROTEINUR 100 (A) 12/29/2019 1746   UROBILINOGEN 1.0 05/31/2014 0933   NITRITE POSITIVE (A) 12/29/2019 1746   LEUKOCYTESUR MODERATE (A) 12/29/2019 1746      Imaging Results:    CT ABDOMEN PELVIS WO CONTRAST  Result Date: 12/29/2019 CLINICAL DATA:  Unspecified abdominal pain. Pelvic pain EXAM: CT ABDOMEN AND PELVIS WITHOUT CONTRAST TECHNIQUE: Multidetector CT imaging of the abdomen and pelvis was performed following the standard protocol without IV contrast. COMPARISON:  CT 11/12/2018 FINDINGS: Lower chest: Subsegmental atelectasis in the lower lobes. No pleural fluid. There mitral annulus calcifications. Hepatobiliary: No focal hepatic abnormality. Clips in the gallbladder fossa postcholecystectomy. No biliary dilatation. Pancreas: Parenchymal atrophy. No ductal dilatation or inflammation. Spleen: Normal in size without focal abnormality. Adrenals/Urinary Tract: Stable 19 mm left adrenal nodule with macroscopic fat consistent with myelolipoma, benign. Normal right adrenal gland. Thinning of bilateral renal parenchyma without hydronephrosis. Left renal collecting system appears duplicated. Possible punctate nonobstructing stone in the upper left kidney. No significant perinephric edema. Diffuse urinary bladder wall thickening with perivesicular fat stranding. Stomach/Bowel: Colostomy in the left lower quadrant. In stapled of sigmoid colon in the pelvis with diverticulosis, no diverticulitis. Moderate stool in the more proximal colon. No small bowel inflammation or obstruction. Right periumbilical hernia  contains nonobstructed noninflamed small bowel. Appendix is not confidently visualized, no evidence of appendicitis. Ingested contrast in the stomach with small hiatal hernia containing contrast. Vascular/Lymphatic: Aortic atherosclerosis and tortuosity. No aortic aneurysm. No abdominopelvic adenopathy. Reproductive: Post hysterectomy. Small tubular structure in the right adnexa was present on prior exam measuring 1.8 x 3.6 cm. Stability over greater than 1 year suggest benign process. No suspicious adnexal mass. Other: Perivesicular fat stranding in the pelvis. No significant free fluid. Bilateral fat containing inguinal hernias. Right periumbilical hernia contains nonobstructed small bowel. No free air. Musculoskeletal: Degenerative change in the spine with degenerative disc disease and facet hypertrophy. There are no acute or suspicious osseous abnormalities. IMPRESSION: 1. Diffuse urinary bladder wall thickening with prominent perivesicular fat stranding, suspicious for cystitis. No perinephric stranding to suspect  pyelonephritis. 2. Right periumbilical hernia contains nonobstructed noninflamed small bowel. Bilateral fat containing inguinal hernias. 3. Distal colonic diverticulosis without diverticulitis. Small hiatal hernia contains small amount of enteric contrast, which may reflect reflux or delayed transit. 4. Left lower quadrant colostomy with diverticulosis of the Hartmann's pouch. No acute diverticulitis. 5. Additional chronic findings are stable from prior, as described. Aortic Atherosclerosis (ICD10-I70.0). Electronically Signed   By: Keith Rake M.D.   On: 12/29/2019 21:09       Assessment & Plan:    Active Problems:   UTI (urinary tract infection)   1. UTI-patient has failed outpatient treatment, will admit to hospital for IV antibiotics.  Urine culture has been obtained.  Patient has been started on ceftriaxone which will be continued.  Will follow urine culture results. 2. Abdominal  pain-unclear etiology, patient does have right periumbilical hernia, colostomy in place also has diffuse urinary bladder wall thickening suspicious for cystitis.  Started on ceftriaxone as above.  Will follow. 3. HIV-continue antiretroviral therapy 4. Acute kidney injury on CKD stage III-patient baseline creatinine around 1.13, today presented with creatinine of 1.73.  Start gentle IV hydration with normal saline.  Follow BMP in a.m. 5. Hypertension-blood pressure is elevated, continue Bystolic, will add hydralazine 25 mg p.o. every 6 hours as needed for BP more than 160/100.   DVT Prophylaxis-   Lovenox   AM Labs Ordered, also please review Full Orders  Family Communication: Admission, patients condition and plan of care including tests being ordered have been discussed with the patient  who indicate understanding and agree with the plan and Code Status.  Code Status: Full code  Admission- /Inpatient :The appropriate admission status for this patient is INPATIENT. Inpatient status is judged to be reasonable and necessary in order to provide the required intensity of service to ensure the patient's safety. The patient's presenting symptoms, physical exam findings, and initial radiographic and laboratory data in the context of their chronic comorbidities is felt to place them at high risk for further clinical deterioration. Furthermore, it is not anticipated that the patient will be medically stable for discharge from the hospital within 2 midnights of admission. The following factors support the admission status of inpatient.     The patient's presenting symptoms include .  Dysuria abdominal pain The initial radiographic and laboratory data are worrisome because of abnormal UA The chronic co-morbidities include HIV       * I certify that at the point of admission it is my clinical judgment that the patient will require inpatient hospital care spanning beyond 2 midnights from the point of  admission due to high intensity of service, high risk for further deterioration and high frequency of surveillance required.*  Time spent in minutes : 60 minutes   Shatira Dobosz S Christyl Osentoski M.D

## 2019-12-29 NOTE — ED Provider Notes (Signed)
[facility] EMERGENCY DEPARTMENT Provider Note   CSN: [id number] Arrival date & time: 12/29/19  1551     History Chief Complaint  Patient presents with  . Abdominal Pain    [name] is a 79 y.o. female with a history of COPD, CKD, anxiety, HIV last viral load undecetable, IBS, prior bowel obstructions, hypothyroidism, as well as partial colectomy, colostomy, laparotomy, cholecystectomy, abdominal hysterectomy, and tubal ligation who presents to the ED with complaints urinary sxs & abdominal pain x 1 week. Patient states she is having dysuria, urgency, frequency, nausea, and intermittent lower abdominal pain as well as fairly persistent L flank pain. Other than urinating no alleviating/aggravating factors. She saw her PCP 1 week prior with onset of sxs who started an unknown abx for a UTI. She has been taking this for a little less than a week without improvement, she is feeling worse. She spoke with her doctor today who told her to come to the ED to be admitted. States she has also had a few days of vaginal discharge. States she has also had trouble moving her bowels over the past few days, passing gas "sometimes", but she states she always has trouble with her bowels. Denies fever, chills, emesis, chest pain, dyspnea, melena, hematochezia, or syncope.     HPI     Past Medical History:  Diagnosis Date  . Anxiety   . Arthritis   . Bronchitis   . CKD (chronic kidney disease) stage 3, GFR 30-59 ml/min 05/04/2016  . COPD (chronic obstructive pulmonary disease) ([place])   . Depression   . Dysuria 03/27/2019  . Elevated LFTs 05/04/2016  . History of TIAs 1981   left side weakness  . HIV (human immunodeficiency virus infection) ([place])   . Hypertension   . Kidney disease related to HIV infection [facility]    pt states she has to have her creatinine level checked often   . Nausea 03/03/2017  . Peripheral neuropathy    bilateral feet  . Seasonal allergies 03/27/2019  . Sinusitis 05/06/2018  .  UTI (urinary tract infection) 03/03/2017  . Weight loss 03/03/2017    Patient Active Problem List   Diagnosis Date Noted  . History of colonic polyps 07/06/2019  . History of partial colectomy 07/06/2019  . Seasonal allergies 03/27/2019  . Dysuria 03/27/2019  . Obesity (BMI 30-39.9) 11/12/2018  . Constipation due to opioid therapy 11/12/2018  . Lactic acidosis 11/12/2018  . Large bowel perforation ([place]) 11/12/2018  . Peritonitis ([place]) 11/12/2018  . Stercoral ulcer of large intestine 11/12/2018  . Pneumoperitoneum   . COPD (chronic obstructive pulmonary disease) ([place]) 12/17/2017  . Hypothyroidism 12/17/2017  . Steroid-induced psychosis, with hallucinations ([place])   . UTI (urinary tract infection) 03/03/2017  . Nausea 03/03/2017  . Weight loss 03/03/2017  . CAP (community acquired pneumonia) 07/22/2016  . CKD (chronic kidney disease) stage 3, GFR 30-59 ml/min 05/04/2016  . Elevated LFTs 05/04/2016  . 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 Breath shortness 06/19/2014  . Syncope 05/31/2014  . Rib fracture 05/31/2014  . Weight gain 03/21/2014  . Cervical spondylosis 08/15/2013  . Neck pain on left side 05/24/2013  . Unspecified vitamin D deficiency 12/14/2012  . Vitamin D toxicity 12/14/2012  . Hemorrhoid 09/01/2012  . Myalgia 02/25/2012  . HTN (hypertension) 11/25/2011  . Cramps, muscle, general 08/18/2011  . RECTAL BLEEDING 11/03/2010  . DYSPHAGIA 11/03/2010  . FATIGUE 08/29/2010  . MUSCLE PAIN 06/04/2010  . Acute cystitis 05/12/2010  . RENAL INSUFFICIENCY 12/18/2008  .  DEPRESSION 09/04/2008  . IRRITABLE BOWEL SYNDROME 05/22/2008  . HERNIATED CERVICAL DISC 10/05/2007  . HERNIATED LUMBAR DISC 10/05/2007  . History of urinary tract infection 09/20/2007  . HIV-1 associated autonomic neuropathy ([place]) 03/16/2007  . ELBOW PAIN 03/16/2007  . ISCHEMIC COLITIS 12/27/2006  . RENAL FAILURE NOS 12/27/2006  . Human immunodeficiency virus (HIV) disease ([place]) 08/27/2006    Past Surgical  History:  Procedure Laterality Date  . ABDOMINAL HYSTERECTOMY  1985  . BACK SURGERY    . CATARACT EXTRACTION, BILATERAL    . CHOLECYSTECTOMY    . COLONOSCOPY  08/10/2002   NUR: Normal colonoscopy except for some hemorrhoids and some erythema at the dentate line  . COLONOSCOPY  12/10/2006   Edwards:Diffuse colitis in the transverse and descending colon/This is not entirely typical of ischemic colitis or her HIV positive/ disease.  We need to be concerned about other causes  . COLONOSCOPY  01/19/2011   RMR: Internal and external hemorrhoids, likely source of hematochezia anal papilla, otherwise normal rectal mucosa/ Left-sided diverticula.  Cecal polyp with status post cold snare polypectomy.  Remainder of colonic mucosa appeared normal. Pathology with tubular adenoma. Repeat in 2017  . COLOSTOMY N/A 11/12/2018   Procedure: COLOSTOMY;  Surgeon: Virl Cagey, MD;  Location: [facility];  Service: General;  Laterality: N/A;  . ESOPHAGOGASTRODUODENOSCOPY  08/10/2002     NUR: Mild changes of reflux esophagitis limited to gastroesophageal junction  No evidence of ring, stricture, or esophageal candidiasis dysphagia/ Gastritis, possibly H. pylori induced  . ESOPHAGOGASTRODUODENOSCOPY  12/25/2002   NUR: Erosive antral gastritis.  The degree of gastritis is more significant than her last exam/ Normal examination of the esophagus/ Esophageal dilatation performed by passing 75 and 2 [place]  . ESOPHAGOGASTRODUODENOSCOPY  11/13/2010   RMR: Circumferential distal esophageal erosions with soft peptic stricture, consistent with erosive reflux esophagitis or stricture  formation, status post dilation as described above/  Small hiatal hernia/ Tiny antral erosions, otherwise normal stomach D1 and D2  . HEMORRHOID SURGERY  09/02/2012   Procedure: HEMORRHOIDECTOMY;  Surgeon: Jamesetta So, MD;  Location: [facility];  Service: General;  Laterality: N/A;  . LAPAROTOMY N/A 11/12/2018   Procedure: EXPLORATORY  LAPAROTOMY;  Surgeon: Virl Cagey, MD;  Location: [facility];  Service: General;  Laterality: N/A;  . PARTIAL COLECTOMY N/A 11/12/2018   Procedure: PARTIAL COLECTOMY;  Surgeon: Virl Cagey, MD;  Location: [facility];  Service: General;  Laterality: N/A;  . TUBAL LIGATION       OB History    Gravida  4   Para  3   Term  2   Preterm  1   AB  1   Living  3     SAB  1   TAB      Ectopic      Multiple      Live Births  3           Family History  Problem Relation Age of Onset  . Diabetes Mother   . Diabetes Brother   . Colon cancer Brother        Diagnosed > age 32    Social History   Tobacco Use  . Smoking status: Never Smoker  . Smokeless tobacco: Never Used  Substance Use Topics  . Alcohol use: No  . Drug use: No    Home Medications Prior to Admission medications   Medication Sig Start Date End Date Taking? Authorizing Provider  abacavir-dolutegravir-lamiVUDine (TRIUMEQ) 600-50-300 MG tablet  Take 1 tablet by mouth daily. 03/27/19   Truman Hayward, MD  albuterol (PROVENTIL HFA;VENTOLIN HFA) 108 [phone] Base) MCG/ACT inhaler Inhale 1-2 puffs into the lungs every 6 (six) hours as needed for wheezing or shortness of breath. 04/02/17   Nat Christen, MD  amitriptyline (ELAVIL) 50 MG tablet Take 50 mg by mouth at bedtime.     [provider]  Cholecalciferol (VITAMIN D3) 10000 units TABS Take 1 tablet by mouth daily.    [provider]  diphenhydrAMINE (BENADRYL) 25 mg capsule Take 25 mg by mouth daily as needed for itching (itching from hydrocodone).    [provider]  docusate sodium (COLACE) 100 MG capsule Take 1 capsule (100 mg total) by mouth 2 (two) times daily. 11/21/18   Virl Cagey, MD  fluocinonide-emollient (LIDEX-E) 0.05 % cream Apply 1 application topically 2 (two) times daily. 05/23/19   Florian Buff, MD  fluticasone (FLONASE) 50 MCG/ACT nasal spray 2 SPRAYS INTO BOTH NOSTRILS AT BEDTIME. Patient taking  differently: Place 2 sprays into both nostrils at bedtime.  05/22/19   Truman Hayward, MD  furosemide (LASIX) 40 MG tablet Take 40 mg by mouth daily as needed for fluid.     [provider]  ipratropium-albuterol (DUONEB) 0.5-2.5 (3) MG/3ML SOLN Inhale 3 mLs into the lungs every 6 (six) hours as needed (shortness of breath).  04/02/17   [provider]  LORazepam (ATIVAN) 0.5 MG tablet Take 0.5 mg by mouth 2 (two) times daily.  11/01/18   [provider]  magnesium 30 MG tablet Take 30 mg by mouth at bedtime.     [provider]  Multiple Vitamin (ONE-A-DAY 55 PLUS PO) Take 1 tablet by mouth daily.    [provider]  nebivolol (BYSTOLIC) 10 MG tablet Take 10 mg by mouth daily.     [provider]  ondansetron (ZOFRAN) 8 MG tablet Take 8 mg by mouth daily as needed for nausea or vomiting.     [provider]  polyvinyl alcohol (LIQUIFILM TEARS) 1.4 % ophthalmic solution Place 1 drop into both eyes as needed for dry eyes.    [provider]  Psyllium (METAMUCIL PO) Take by mouth. 1 tsp twice daily    [provider]  solifenacin (VESICARE) 10 MG tablet Take 1 tablet (10 mg total) by mouth daily. 05/23/19   Florian Buff, MD  thiamine (VITAMIN B-1) 100 MG tablet Take 100 mg by mouth daily.    [provider]    Allergies    Bee venom, Promethazine, Tenofovir disoproxil fumarate [tenofovir disoproxil fumarate], Compazine  [prochlorperazine edisylate], Haloperidol lactate, Brompheniramine-acetaminophen, Chlorcyclizine, Chlorpromazine, Codeine, Navane [thiothixene], Other, Prednisone, Promethazine hcl, Propoxyphene n-acetaminophen, and Morphine  Review of Systems   Review of Systems  Constitutional: Negative for chills and fever.  Respiratory: Negative for shortness of breath.   Cardiovascular: Negative for chest pain.  Gastrointestinal: Positive for abdominal pain, constipation and nausea. Negative for blood  in stool, diarrhea and vomiting.  Genitourinary: Positive for dysuria, flank pain, frequency, urgency and vaginal discharge.  Neurological: Negative for syncope.  All other systems reviewed and are negative.   Physical Exam Updated Vital Signs BP (!) 190/65 (BP Location: Left Arm)   Pulse (!) 53   Temp 97.8 F (36.6 C) (Oral)   Resp 18   Ht 5\' 4"  (1.626 m)   Wt 83.9 kg   SpO2 99%   BMI 31.76 kg/m   Physical Exam Vitals  and nursing note reviewed. Exam conducted with a chaperone present.  Constitutional:      General: She is not in acute distress.    Appearance: She is well-developed. She is obese. She is not toxic-appearing.  HENT:     Head: Normocephalic and atraumatic.  Eyes:     General:        Right eye: No discharge.        Left eye: No discharge.     Conjunctiva/sclera: Conjunctivae normal.  Cardiovascular:     Rate and Rhythm: Regular rhythm. Bradycardia present.  Pulmonary:     Effort: Pulmonary effort is normal. No respiratory distress.     Breath sounds: Normal breath sounds. No wheezing, rhonchi or rales.  Abdominal:     General: A surgical scar is present. There is no distension.     Palpations: Abdomen is soft.     Tenderness: There is abdominal tenderness (lower abdomen). There is left CVA tenderness. There is no right CVA tenderness, guarding or rebound.     Hernia: A hernia (reducible) is present. Hernia is present in the umbilical area.     Comments: LLQ colostomy bag present.   Genitourinary:    Comments: No external discharge/bleeding.  Musculoskeletal:     Cervical back: Neck supple.  Skin:    General: Skin is warm and dry.     Findings: No rash.  Neurological:     Mental Status: She is alert.     Comments: Clear speech.   Psychiatric:        Behavior: Behavior normal.    ED Results / Procedures / Treatments   Labs (all labs ordered are listed, but only abnormal results are displayed) Labs Reviewed  WET PREP, GENITAL - Abnormal; Notable  for the following components:      Result Value   WBC, Wet Prep HPF POC FEW (*)    All other components within normal limits  COMPREHENSIVE METABOLIC PANEL - Abnormal; Notable for the following components:   Glucose, Bld 101 (*)    BUN 27 (*)    Creatinine, Ser 1.73 (*)    GFR calc non Af Amer 28 (*)    GFR calc Af Amer 32 (*)    All other components within normal limits  CBC WITH DIFFERENTIAL/PLATELET - Abnormal; Notable for the following components:   MCV 102.9 (*)    MCH 34.4 (*)    All other components within normal limits  URINALYSIS, ROUTINE W REFLEX MICROSCOPIC - Abnormal; Notable for the following components:   APPearance HAZY (*)    Protein, ur 100 (*)    Nitrite POSITIVE (*)    Leukocytes,Ua MODERATE (*)    WBC, UA >50 (*)    Bacteria, UA RARE (*)    All other components within normal limits  URINE CULTURE  SARS CORONAVIRUS 2 (TAT 6-24 HRS)    EKG None  Radiology No results found.  CLINICAL DATA: Unspecified abdominal pain. Pelvic pain  EXAM: CT ABDOMEN AND PELVIS WITHOUT CONTRAST  TECHNIQUE: Multidetector CT imaging of the abdomen and pelvis was performed following the standard protocol without IV contrast.  COMPARISON: CT 11/12/2018  FINDINGS: Lower chest: Subsegmental atelectasis in the lower lobes. No pleural fluid. There mitral annulus calcifications.  Hepatobiliary: No focal hepatic abnormality. Clips in the gallbladder fossa postcholecystectomy. No biliary dilatation.  Pancreas: Parenchymal atrophy. No ductal dilatation or inflammation.  Spleen: Normal in size without focal abnormality.  Adrenals/Urinary Tract: Stable 19 mm left adrenal nodule with macroscopic fat  consistent with myelolipoma, benign. Normal right adrenal gland. Thinning of bilateral renal parenchyma without hydronephrosis. Left renal collecting system appears duplicated. Possible punctate nonobstructing stone in the upper left kidney. No significant perinephric edema.  Diffuse urinary bladder wall thickening with perivesicular fat stranding.  Stomach/Bowel: Colostomy in the left lower quadrant. In stapled of sigmoid colon in the pelvis with diverticulosis, no diverticulitis. Moderate stool in the more proximal colon. No small bowel inflammation or obstruction. Right periumbilical hernia contains nonobstructed noninflamed small bowel. Appendix is not confidently visualized, no evidence of appendicitis. Ingested contrast in the stomach with small hiatal hernia containing contrast.  Vascular/Lymphatic: Aortic atherosclerosis and tortuosity. No aortic aneurysm. No abdominopelvic adenopathy.  Reproductive: Post hysterectomy. Small tubular structure in the right adnexa was present on prior exam measuring 1.8 x 3.6 cm. Stability over greater than 1 year suggest benign process. No suspicious adnexal mass.  Other: Perivesicular fat stranding in the pelvis. No significant free fluid. Bilateral fat containing inguinal hernias. Right periumbilical hernia contains nonobstructed small bowel. No free air.  Musculoskeletal: Degenerative change in the spine with degenerative disc disease and facet hypertrophy. There are no acute or suspicious osseous abnormalities.  IMPRESSION: 1. Diffuse urinary bladder wall thickening with prominent perivesicular fat stranding, suspicious for cystitis. No perinephric stranding to suspect pyelonephritis. 2. Right periumbilical hernia contains nonobstructed noninflamed small bowel. Bilateral fat containing inguinal hernias. 3. Distal colonic diverticulosis without diverticulitis. Small hiatal hernia contains small amount of enteric contrast, which may reflect reflux or delayed transit. 4. Left lower quadrant colostomy with diverticulosis of the Hartmann's pouch. No acute diverticulitis. 5. Additional chronic findings are stable from prior, as described.  Aortic Atherosclerosis (ICD10-I70.0).   Electronically Signed By:  Keith Rake M.D. On: 12/29/2019 21:09 Procedures Procedures (including critical care time)  Medications Ordered in ED Medications  iohexol (OMNIPAQUE) 9 MG/ML oral solution (has no administration in time range)  fentaNYL (SUBLIMAZE) injection 50 mcg (50 mcg Intravenous Given 12/29/19 1735)  ondansetron (ZOFRAN) injection 4 mg (4 mg Intravenous Given 12/29/19 1832)  fentaNYL (SUBLIMAZE) injection 50 mcg (50 mcg Intravenous Given 12/29/19 2131)  cefTRIAXone (ROCEPHIN) 1 g in sodium chloride 0.9 % 100 mL IVPB (1 g Intravenous New Bag/Given 12/29/19 2134)    ED Course  I have reviewed the triage vital signs and the nursing notes.  Pertinent labs & imaging results that were available during my care of the patient were reviewed by me and considered in my medical decision making (see chart for details).    [name] was evaluated in Emergency Department on 12/29/2019 for the symptoms described in the history of present illness. He/she was evaluated in the context of the global COVID-19 pandemic, which necessitated consideration that the patient might be at risk for infection with the SARS-CoV-2 virus that causes COVID-19. Institutional protocols and algorithms that pertain to the evaluation of patients at risk for COVID-19 are in a state of rapid change based on information released by regulatory bodies including the CDC and federal and state organizations. These policies and algorithms were followed during the patient's care in the ED.  MDM Rules/Calculators/A&P                      Patient presents to the ED with complaints of 1 week of urinary sxs, abdominal discomfort, & L flank pain. Also has had some issues moving her bowels. Patient is nontoxic, resting comfortably, bradycardic & hypertensive similar to most recent medical office visit. She has L CVA tenderness  as well as lower abdominal tenderness. No peritoneal signs. No external discharge/bleeding. Plan for labs & CT A/P.   CBC: No  significant anemia or leukocytosis.  CMP: Some worsening of renal function creatinine 1.73 today, most recently 1.60. No significant electrolyte derangement.  UA: Consistent w/ infection, culture sent.  Wet prep: No BV, Yeast, or trich.   CT A/P: findings consistent w/ cystitis- no perinephric stranding to indicate pyelo per CT. Hernia present without obstruction. Additional findings as above.   Given patient with persistent sxs w/ some worsening with infected UA after several days of unknown abx concern for failed outpatient therapy, her PCP reportedly sent her to the ED and told her that she needed to be admitted however we cannot see PCP notes/culture report unfortunately. While her CT does not show signs of pyelonephritis there is concern for this given L CVA tenderness with nausea. Patient would feel more comfortable with admission.  Reviewed most recent urine culture from 06/2019- e.coli, sensitive to rocephin- ordered in the ED & will consult for admission.   Findings and plan of care discussed with supervising physician Dr. Karle Starch who is in agreement.   22:10: CONSULT: Discussed with hospitalist Dr. Darrick Meigs- accepts admission.   Final Clinical Impression(s) / ED Diagnoses Final diagnoses:  Cystitis    Rx / DC Orders ED Discharge Orders    None       Leafy Kindle 12/29/19 2211    Truddie Hidden, MD 12/29/19 [phone]

## 2019-12-30 ENCOUNTER — Encounter (HOSPITAL_COMMUNITY): Payer: Self-pay | Admitting: Family Medicine

## 2019-12-30 DIAGNOSIS — N179 Acute kidney failure, unspecified: Secondary | ICD-10-CM

## 2019-12-30 DIAGNOSIS — N1832 Chronic kidney disease, stage 3b: Secondary | ICD-10-CM

## 2019-12-30 DIAGNOSIS — B20 Human immunodeficiency virus [HIV] disease: Secondary | ICD-10-CM

## 2019-12-30 DIAGNOSIS — F418 Other specified anxiety disorders: Secondary | ICD-10-CM

## 2019-12-30 DIAGNOSIS — I10 Essential (primary) hypertension: Secondary | ICD-10-CM

## 2019-12-30 LAB — URINE CULTURE: Culture: <10000 — AB

## 2019-12-30 LAB — COMPREHENSIVE METABOLIC PANEL
ALT: 26 U/L (ref 0–44)
AST: 26 U/L (ref 15–41)
Albumin: 4.3 g/dL (ref 3.5–5.0)
Alkaline Phosphatase: 66 U/L (ref 38–126)
Anion gap: 10 (ref 5–15)
BUN: 23 mg/dL (ref 8–23)
CO2: 26 mmol/L (ref 22–32)
Calcium: 9.9 mg/dL (ref 8.9–10.3)
Chloride: 104 mmol/L (ref 98–111)
Creatinine, Ser: 1.54 mg/dL — ABNORMAL HIGH (ref 0.44–1.00)
GFR calc Af Amer: 37 mL/min — ABNORMAL LOW (ref >60)
GFR calc non Af Amer: 32 mL/min — ABNORMAL LOW (ref >60)
Glucose, Bld: 100 mg/dL — ABNORMAL HIGH (ref 70–99)
Potassium: 3.9 mmol/L (ref 3.5–5.1)
Sodium: 140 mmol/L (ref 135–145)
Total Bilirubin: 1.2 mg/dL (ref 0.3–1.2)
Total Protein: 7.2 g/dL (ref 6.5–8.1)

## 2019-12-30 LAB — CBC
HCT: 46.4 % — ABNORMAL HIGH (ref 36.0–46.0)
Hemoglobin: 15.2 g/dL — ABNORMAL HIGH (ref 12.0–15.0)
MCH: 34 pg (ref 26.0–34.0)
MCHC: 32.8 g/dL (ref 30.0–36.0)
MCV: 103.8 fL — ABNORMAL HIGH (ref 80.0–100.0)
Platelets: 228 10*3/uL (ref 150–400)
RBC: 4.47 MIL/uL (ref 3.87–5.11)
RDW: 12.5 % (ref 11.5–15.5)
WBC: 7.1 10*3/uL (ref 4.0–10.5)
nRBC: 0 % (ref 0.0–0.2)

## 2019-12-30 LAB — SARS CORONAVIRUS 2 (TAT 6-24 HRS): SARS Coronavirus 2: NEGATIVE

## 2019-12-30 MED ORDER — IPRATROPIUM-ALBUTEROL 0.5-2.5 (3) MG/3ML IN SOLN: 3.0000 mL | Freq: Four times a day (QID) | RESPIRATORY_TRACT | Status: DC | PRN

## 2019-12-30 MED ORDER — ABACAVIR-DOLUTEGRAVIR-LAMIVUD 600-50-300 MG PO TABS
1.0000 | ORAL_TABLET | Freq: Every day | ORAL | Status: DC
  Administered 2019-12-30 – 2019-12-31 (×2): 1 via ORAL
  Filled 2019-12-30 (×3): qty 1

## 2019-12-30 MED ORDER — LORAZEPAM 0.5 MG PO TABS
0.5000 mg | ORAL_TABLET | Freq: Two times a day (BID) | ORAL | Status: DC
  Administered 2019-12-30 – 2019-12-31 (×4): 0.5 mg via ORAL
  Filled 2019-12-30 (×5): qty 1

## 2019-12-30 MED ORDER — AMITRIPTYLINE HCL 25 MG PO TABS
50.0000 mg | ORAL_TABLET | Freq: Every day | ORAL | Status: DC
  Filled 2019-12-30 (×2): qty 2

## 2019-12-30 MED ORDER — ADULT MULTIVITAMIN W/MINERALS CH
1.0000 | ORAL_TABLET | Freq: Every day | ORAL | Status: DC
  Administered 2019-12-30 – 2019-12-31 (×2): 1 via ORAL
  Filled 2019-12-30 (×2): qty 1

## 2019-12-30 MED ORDER — THIAMINE HCL 100 MG PO TABS
100.0000 mg | ORAL_TABLET | Freq: Every day | ORAL | Status: DC
  Administered 2019-12-30 – 2019-12-31 (×2): 100 mg via ORAL
  Filled 2019-12-30 (×3): qty 1

## 2019-12-30 MED ORDER — ONDANSETRON HCL 4 MG PO TABS: 4.0000 mg | ORAL_TABLET | Freq: Four times a day (QID) | ORAL | Status: DC | PRN

## 2019-12-30 MED ORDER — ALBUTEROL SULFATE (2.5 MG/3ML) 0.083% IN NEBU: 3.0000 mL | INHALATION_SOLUTION | Freq: Four times a day (QID) | RESPIRATORY_TRACT | Status: DC | PRN

## 2019-12-30 MED ORDER — ENOXAPARIN SODIUM 40 MG/0.4ML ~~LOC~~ SOLN
40.0000 mg | Freq: Every day | SUBCUTANEOUS | Status: DC
  Administered 2019-12-31: 40 mg via SUBCUTANEOUS
  Filled 2019-12-30: qty 0.4

## 2019-12-30 MED ORDER — ENSURE ENLIVE PO LIQD: 237.0000 mL | Freq: Two times a day (BID) | ORAL | Status: DC

## 2019-12-30 MED ORDER — FLUTICASONE PROPIONATE 50 MCG/ACT NA SUSP
2.0000 | Freq: Every day | NASAL | Status: DC
  Filled 2019-12-30: qty 16

## 2019-12-30 MED ORDER — NEBIVOLOL HCL 10 MG PO TABS
10.0000 mg | ORAL_TABLET | Freq: Every day | ORAL | Status: DC
  Administered 2019-12-30 – 2019-12-31 (×2): 10 mg via ORAL
  Filled 2019-12-30 (×3): qty 1

## 2019-12-30 MED ORDER — ENOXAPARIN SODIUM 30 MG/0.3ML ~~LOC~~ SOLN
30.0000 mg | Freq: Every day | SUBCUTANEOUS | Status: DC
  Administered 2019-12-30: 30 mg via SUBCUTANEOUS
  Filled 2019-12-30 (×2): qty 0.3

## 2019-12-30 MED ORDER — DOCUSATE SODIUM 100 MG PO CAPS
100.0000 mg | ORAL_CAPSULE | Freq: Two times a day (BID) | ORAL | Status: DC
  Administered 2019-12-30 – 2019-12-31 (×4): 100 mg via ORAL
  Filled 2019-12-30 (×5): qty 1

## 2019-12-30 MED ORDER — SODIUM CHLORIDE 0.9 % IV SOLN
1.0000 g | INTRAVENOUS | Status: DC
  Administered 2019-12-30: 1 g via INTRAVENOUS
  Filled 2019-12-30 (×3): qty 10

## 2019-12-30 MED ORDER — ONDANSETRON HCL 4 MG/2ML IJ SOLN: 4.0000 mg | Freq: Four times a day (QID) | INTRAMUSCULAR | Status: DC | PRN

## 2019-12-30 MED ORDER — SODIUM CHLORIDE 0.9 % IV SOLN: INTRAVENOUS | Status: DC

## 2019-12-30 MED ORDER — HYDRALAZINE HCL 25 MG PO TABS: 25.0000 mg | ORAL_TABLET | Freq: Four times a day (QID) | ORAL | Status: DC | PRN

## 2019-12-30 MED ORDER — ENSURE ENLIVE PO LIQD
237.0000 mL | Freq: Two times a day (BID) | ORAL | Status: DC
  Administered 2019-12-31: 237 mL via ORAL

## 2019-12-30 MED ORDER — PHENAZOPYRIDINE HCL 100 MG PO TABS
100.0000 mg | ORAL_TABLET | Freq: Every day | ORAL | Status: DC
  Administered 2019-12-30: 100 mg via ORAL
  Filled 2019-12-30: qty 1

## 2019-12-30 NOTE — Progress Notes (Addendum)
Initial Nutrition Assessment  **RD working remotely**  DOCUMENTATION CODES:   Obesity unspecified  INTERVENTION:   Continue Ensure Enlive po BID, each supplement provides 350 kcal and 20 grams of protein  MVI daily   NUTRITION DIAGNOSIS:   Inadequate oral intake related to poor appetite as evidenced by per patient/family report.   GOAL:   Patient will meet greater than or equal to 90% of their needs    MONITOR:   PO intake, Supplement acceptance, Weight trends, Labs  REASON FOR ASSESSMENT:   Malnutrition Screening Tool    ASSESSMENT:   Pt with a PMH significant for COPD, CKD stage III, HTN, hx of TIAs, HIV, IBS, prior bowel obstruction, bowel perforation s/p partial colectomy with colostomy in place, and cholecystectomy presented to ED C/O dysuria, nausea, and abdominal pain x1 week. Pt admitted for UTI.  Pt states her appetite is improving, but endorses a poor appetite for ~1 month PTA. Pt states that she would typically eat  pancakes and bacon for breakfast. For lunch, she would have a ham and cheese sandwich. For dinner, she would often have black beans and rice with vegetables. Pt reports eating similar items when her appetite was poor, but having smaller portions. Pt agreeable to drinking Ensure while admitted to help meet protein/calorie needs.  Pt reports an 8lb weight loss over the last month due to reduced appetite. She states her weight prior to this was pretty stable. Based on weight readings in the chart, pt has lost 2.9% x6 months, which is not significant for time frame.   No PO intake documented.  Medications reviewed and include: Colace, Ensure Enlive BID, Thiamine, IV abx  Labs reviewed: Cr 1.54 (H, trending down)  UOP: 689ml x24 hours I/O: -333.67ml since admit  NUTRITION - FOCUSED PHYSICAL EXAM:  RD unable to perform at this time.    Diet Order:   Diet Order            Diet regular Room service appropriate? Yes; Fluid consistency: Thin   Diet effective now              EDUCATION NEEDS:   No education needs have been identified at this time  Skin:  Skin Assessment: Reviewed RN Assessment  Last BM:  12/29/19  Height:   Ht Readings from Last 1 Encounters:  12/30/19 5\' 4"  (1.626 m)    Weight:   Wt Readings from Last 1 Encounters:  12/30/19 86.1 kg    BMI:  Body mass index is 32.58 kg/m.  Estimated Nutritional Needs:   Kcal:  1800-2000  Protein:  90-120 grams  Fluid:  >2L/d   Larkin Ina, MS, RD, LDN RD pager number and weekend/on-call pager number located in [place].

## 2019-12-30 NOTE — Progress Notes (Signed)
PROGRESS NOTE    [name]  CXK:[id number] DOB: Jan 27, 1941 DOA: 12/29/2019 PCP: System, Pcp Not In     Brief Narrative:  As per H&P written by Dr. Darrick Meigs on 12/29/2019 79 y.o. female, with history of COPD, CKD stage III, anxiety, HIV, last viral load undetectable, IBS, prior bowel obstruction, bowel perforation s/p partial colectomy with colostomy in place, cholecystectomy, abdominal hysterectomy and tubal ligation came to ED with complaints of dysuria and abdominal pain for past 1 week.  Patient said that she has been having dysuria, urgency frequency and intermittent lower abdominal pain with back pain for past 1 week.  She saw her PCP 1 week prior who started her on antibiotics for UTI.  She has been taking antibiotics for just less than a week without any significant improvement.  She called her PCP who told her to come to the ED for further evaluation.  Denies vomiting but complains of nausea.  Denies fever but has chills. In the ED she was found to have abnormal UA, started on IV ceftriaxone. CT abdomen and pelvis was done which showed no perinephric stranding to indicate pyelonephritis.  It showed diffuse urinary bladder wall thickening with prominent perivascular fat stranding suspicious for cystitis.  Assessment & Plan: 1-E. coli UTI -Patient has failed the use of Macrobid as an outpatient to clear infection -Imaging studies demonstrating cystitis without pyelonephritis. -Currently afebrile but still significantly symptomatic -Urine culture demonstrating E. coli microorganism no growth no significant for sensitivity; most likely as she was already using some antibiotics prior to being admitted. -Continue IV fluids, continue current IV antibiotics -Low-dose pyridium to be given for 2 dosages in order to assist with patient symptomatology.  2-abdominal pain -Most likely associated with cystitis -Continue treatment as mentioned above and provide supportive care.  3-history of  HIV -Continue antiretroviral therapy.  4-acute on chronic kidney disease stage IIIb -In the setting of dehydration and UTI -Continue IV fluids -Minimize the use of nephrotoxic agents -Avoid hypotension -Repeat basic metabolic panel in a.m. to follow renal function trend. -Patient baseline creatinine around 1.13, on admission 1.73.  5-hypertension -Continue the use of Bystolic and use hydralazine as needed for systolic blood pressure above 970 and/or diastolic blood pressure above 100. -Heart healthy diet ordered.  6-history of depression/anxiety -Mood currently stable -Continue nightly Elavil and as needed lorazepam. -No suicidal ideation or hallucination.  DVT prophylaxis: lovenox Code Status: full code. Family Communication: no family at bedside Disposition Plan: Remains inpatient, continue IV fluids, continue IV antibiotics and provide adjusted dose of Pyridium to assist with ongoing dysuria.  Follow clinical response prior to discharge hopefully in the next 24-48 hours.  Consultants:   None  Procedures:   See below for x-ray reports.  Antimicrobials:  Anti-infectives (From admission, onward)   Start     Dose/Rate Route Frequency Ordered Stop   12/30/19 1000  abacavir-dolutegravir-lamiVUDine (TRIUMEQ) 600-50-300 MG per tablet 1 tablet     1 tablet Oral Daily 12/30/19 0050     12/30/19 1000  cefTRIAXone (ROCEPHIN) 1 g in sodium chloride 0.9 % 100 mL IVPB     1 g 200 mL/hr over 30 Minutes Intravenous Every 24 hours 12/30/19 0050     12/29/19 2100  cefTRIAXone (ROCEPHIN) 1 g in sodium chloride 0.9 % 100 mL IVPB     1 g 200 mL/hr over 30 Minutes Intravenous  Once 12/29/19 2048 12/29/19 2230       Subjective: Afebrile, no chest pain, no nausea or vomiting.  Still complaining of significant dysuria and generalized weakness.  Objective: Vitals:   12/29/19 2237 12/29/19 2300 12/30/19 0004 12/30/19 0430  BP: (!) 138/103 132/87 (!) 173/70 (!) 181/69  Pulse: (!) 55 (!)  53 (!) 51 (!) 53  Resp:   18 18  Temp:   97.6 F (36.4 C) 97.6 F (36.4 C)  TempSrc:   Oral Oral  SpO2: 99% 100% 100% 98%  Weight:   86.1 kg   Height:   5\' 4"  (1.626 m)     Intake/Output Summary (Last 24 hours) at 12/30/2019 1736 Last data filed at 12/30/2019 1500 Gross per 24 hour  Intake 266.41 ml  Output 1200 ml  Net -933.59 ml   Filed Weights   12/29/19 1556 12/30/19 0004  Weight: 83.9 kg 86.1 kg    Examination: General exam: Alert, awake, oriented x 3; afebrile, no nausea, no vomiting.  Still complaining of significant dysuria.  Respiratory system: Clear to auscultation. Respiratory effort normal. Cardiovascular system:RRR. No murmurs, rubs, gallops. Gastrointestinal system: Abdomen is nondistended, soft and nontender. No organomegaly or masses felt. Normal bowel sounds heard. Central nervous system: Alert and oriented. No focal neurological deficits. Extremities: No C/C/E, +pedal pulses Skin: No rashes, lesions or ulcers Psychiatry: Judgement and insight appear normal. Mood & affect appropriate.     Data Reviewed: I have personally reviewed following labs and imaging studies  CBC: Recent Labs  Lab 12/29/19 1730 12/30/19 0642  WBC 7.0 7.1  NEUTROABS 3.3  --   HGB 14.2 15.2*  HCT 42.5 46.4*  MCV 102.9* 103.8*  PLT 159 342   Basic Metabolic Panel: Recent Labs  Lab 12/29/19 1730 12/30/19 0642  NA 141 140  K 4.9 3.9  CL 107 104  CO2 27 26  GLUCOSE 101* 100*  BUN 27* 23  CREATININE 1.73* 1.54*  CALCIUM 10.2 9.9   GFR: Estimated Creatinine Clearance: 32 mL/min (A) (by C-G formula based on SCr of 1.54 mg/dL (H)).   Liver Function Tests: Recent Labs  Lab 12/29/19 1730 12/30/19 0642  AST 26 26  ALT 24 26  ALKPHOS 63 66  BILITOT 1.0 1.2  PROT 7.2 7.2  ALBUMIN 4.5 4.3   Urine analysis:    Component Value Date/Time   COLORURINE YELLOW 12/29/2019 1746   APPEARANCEUR HAZY (A) 12/29/2019 1746   LABSPEC 1.018 12/29/2019 1746   PHURINE 7.0  12/29/2019 1746   GLUCOSEU NEGATIVE 12/29/2019 1746   GLUCOSEU NEG mg/dL 09/20/2007 1114   HGBUR NEGATIVE 12/29/2019 1746   HGBUR moderate 06/04/2010 1425   BILIRUBINUR NEGATIVE 12/29/2019 1746   KETONESUR NEGATIVE 12/29/2019 1746   PROTEINUR 100 (A) 12/29/2019 1746   UROBILINOGEN 1.0 05/31/2014 0933   NITRITE POSITIVE (A) 12/29/2019 1746   LEUKOCYTESUR MODERATE (A) 12/29/2019 1746    Recent Results (from the past 240 hour(s))  Urine culture     Status: Abnormal   Collection Time: 12/29/19  5:46 PM   Specimen: Urine, Clean Catch  Result Value Ref Range Status   Specimen Description   Final    URINE, CLEAN CATCH Performed at [facility], [address] [place], [place] [address]    Special Requests   Final    NONE Performed at [facility], [address] [place], [place] [address]    Culture (A)  Final    <10,000 COLONIES/mL INSIGNIFICANT GROWTH Performed at [facility], [place] [address], [place],  [address]    Report Status 12/30/2019 FINAL  Final  Wet prep, genital  Status: Abnormal   Collection Time: 12/29/19  7:45 PM  Result Value Ref Range Status   Yeast Wet Prep HPF POC NONE SEEN NONE SEEN Final   Trich, Wet Prep NONE SEEN NONE SEEN Final   Clue Cells Wet Prep HPF POC NONE SEEN NONE SEEN Final   WBC, Wet Prep HPF POC FEW (A) NONE SEEN Final   Sperm NONE SEEN  Final    Comment: Performed at [facility], [address], [place], [place] [address]  SARS CORONAVIRUS 2 (TAT 6-24 HRS) Nasopharyngeal Nasopharyngeal Swab     Status: None   Collection Time: 12/29/19 10:11 PM   Specimen: Nasopharyngeal Swab  Result Value Ref Range Status   SARS Coronavirus 2 NEGATIVE NEGATIVE Final    Comment: (NOTE) SARS-CoV-2 target nucleic acids are NOT DETECTED. The SARS-CoV-2 RNA is generally detectable in upper and lower respiratory specimens during the acute phase of infection. Negative results do not preclude SARS-CoV-2 infection, do not rule out co-infections  with other pathogens, and should not be used as the sole basis for treatment or other patient management decisions. Negative results must be combined with clinical observations, patient history, and epidemiological information. The expected result is Negative. Fact Sheet for Patients: SugarRoll.be Fact Sheet for Healthcare Providers: https://www.woods-mathews.com/ This test is not yet approved or cleared by the [place] FDA and  has been authorized for detection and/or diagnosis of SARS-CoV-2 by FDA under an Emergency Use Authorization (EUA). This EUA will remain  in effect (meaning this test can be used) for the duration of the COVID-19 declaration under Section 56 4(b)(1) of the Act, 21 U.S.C. section 360bbb-3(b)(1), unless the authorization is terminated or revoked sooner. Performed at [facility], [place] [address] [place], [place] [address]      Radiology Studies: CT ABDOMEN PELVIS WO CONTRAST  Result Date: 12/29/2019 CLINICAL DATA:  Unspecified abdominal pain. Pelvic pain EXAM: CT ABDOMEN AND PELVIS WITHOUT CONTRAST TECHNIQUE: Multidetector CT imaging of the abdomen and pelvis was performed following the standard protocol without IV contrast. COMPARISON:  CT 11/12/2018 FINDINGS: Lower chest: Subsegmental atelectasis in the lower lobes. No pleural fluid. There mitral annulus calcifications. Hepatobiliary: No focal hepatic abnormality. Clips in the gallbladder fossa postcholecystectomy. No biliary dilatation. Pancreas: Parenchymal atrophy. No ductal dilatation or inflammation. Spleen: Normal in size without focal abnormality. Adrenals/Urinary Tract: Stable 19 mm left adrenal nodule with macroscopic fat consistent with myelolipoma, benign. Normal right adrenal gland. Thinning of bilateral renal parenchyma without hydronephrosis. Left renal collecting system appears duplicated. Possible punctate nonobstructing stone in the upper left kidney.  No significant perinephric edema. Diffuse urinary bladder wall thickening with perivesicular fat stranding. Stomach/Bowel: Colostomy in the left lower quadrant. In stapled of sigmoid colon in the pelvis with diverticulosis, no diverticulitis. Moderate stool in the more proximal colon. No small bowel inflammation or obstruction. Right periumbilical hernia contains nonobstructed noninflamed small bowel. Appendix is not confidently visualized, no evidence of appendicitis. Ingested contrast in the stomach with small hiatal hernia containing contrast. Vascular/Lymphatic: Aortic atherosclerosis and tortuosity. No aortic aneurysm. No abdominopelvic adenopathy. Reproductive: Post hysterectomy. Small tubular structure in the right adnexa was present on prior exam measuring 1.8 x 3.6 cm. Stability over greater than 1 year suggest benign process. No suspicious adnexal mass. Other: Perivesicular fat stranding in the pelvis. No significant free fluid. Bilateral fat containing inguinal hernias. Right periumbilical hernia contains nonobstructed small bowel. No free air. Musculoskeletal: Degenerative change in the spine with degenerative disc disease and facet hypertrophy. There are  no acute or suspicious osseous abnormalities. IMPRESSION: 1. Diffuse urinary bladder wall thickening with prominent perivesicular fat stranding, suspicious for cystitis. No perinephric stranding to suspect pyelonephritis. 2. Right periumbilical hernia contains nonobstructed noninflamed small bowel. Bilateral fat containing inguinal hernias. 3. Distal colonic diverticulosis without diverticulitis. Small hiatal hernia contains small amount of enteric contrast, which may reflect reflux or delayed transit. 4. Left lower quadrant colostomy with diverticulosis of the Hartmann's pouch. No acute diverticulitis. 5. Additional chronic findings are stable from prior, as described. Aortic Atherosclerosis (ICD10-I70.0). Electronically Signed   By: Keith Rake  M.D.   On: 12/29/2019 21:09    Scheduled Meds: . abacavir-dolutegravir-lamiVUDine  1 tablet Oral Daily  . amitriptyline  50 mg Oral QHS  . docusate sodium  100 mg Oral BID  . [START ON 12/31/2019] enoxaparin (LOVENOX) injection  40 mg Subcutaneous Daily  . feeding supplement (ENSURE ENLIVE)  237 mL Oral BID BM  . fluticasone  2 spray Each Nare QHS  . LORazepam  0.5 mg Oral BID  . multivitamin with minerals  1 tablet Oral Daily  . nebivolol  10 mg Oral Daily  . phenazopyridine  100 mg Oral Daily  . thiamine  100 mg Oral Daily   Continuous Infusions: . sodium chloride 75 mL/hr at 12/30/19 1600  . cefTRIAXone (ROCEPHIN)  IV 1 g (12/30/19 1052)     LOS: 1 day    Time spent:      Barton Dubois, MD [facility] Pager [phone]   12/30/2019, 5:36 PM

## 2019-12-30 NOTE — Progress Notes (Signed)
Rx Brief note: Lovenox Wt = 86 kg, CrCl~28.5 ml/min  Rx adjusted Lovenox to 30 mg daily in pt with CrCl<30 ml/min  Thanks Dorrene German 12/30/2019 12:53 AM

## 2019-12-30 NOTE — Progress Notes (Signed)
Bedside report completed, assumed care. Pt presents lying supine, hob 30-40. AAOx4, nad, no voiced c/o at present. IVF infusing per order. Site intact. Will con't to monitor.

## 2019-12-31 DIAGNOSIS — N179 Acute kidney failure, unspecified: Secondary | ICD-10-CM

## 2019-12-31 LAB — BASIC METABOLIC PANEL
Anion gap: 7 (ref 5–15)
BUN: 23 mg/dL (ref 8–23)
CO2: 22 mmol/L (ref 22–32)
Calcium: 9.6 mg/dL (ref 8.9–10.3)
Chloride: 111 mmol/L (ref 98–111)
Creatinine, Ser: 1.73 mg/dL — ABNORMAL HIGH (ref 0.44–1.00)
GFR calc Af Amer: 32 mL/min — ABNORMAL LOW (ref >60)
GFR calc non Af Amer: 28 mL/min — ABNORMAL LOW (ref >60)
Glucose, Bld: 151 mg/dL — ABNORMAL HIGH (ref 70–99)
Potassium: 4.4 mmol/L (ref 3.5–5.1)
Sodium: 140 mmol/L (ref 135–145)

## 2019-12-31 MED ORDER — FUROSEMIDE 40 MG PO TABS: 40.0000 mg | ORAL_TABLET | Freq: Every day | ORAL | Status: DC | PRN

## 2019-12-31 MED ORDER — PHENAZOPYRIDINE HCL 100 MG PO TABS: 100.0000 mg | ORAL_TABLET | Freq: Once | ORAL | 0 refills | Status: AC

## 2019-12-31 MED ORDER — AMITRIPTYLINE HCL 50 MG PO TABS: 50.0000 mg | ORAL_TABLET | Freq: Every day | ORAL | 0 refills | Status: DC

## 2019-12-31 MED ORDER — PHENAZOPYRIDINE HCL 100 MG PO TABS: 100.0000 mg | ORAL_TABLET | Freq: Once | ORAL | Status: DC

## 2019-12-31 MED ORDER — CEFDINIR 300 MG PO CAPS: 300.0000 mg | ORAL_CAPSULE | Freq: Two times a day (BID) | ORAL | 0 refills | Status: AC

## 2019-12-31 MED ORDER — CEFDINIR 300 MG PO CAPS
300.0000 mg | ORAL_CAPSULE | Freq: Two times a day (BID) | ORAL | Status: DC
  Administered 2019-12-31: 300 mg via ORAL
  Filled 2019-12-31: qty 1

## 2019-12-31 NOTE — Discharge Summary (Signed)
Physician Discharge Summary  [name] WLS:[id number] DOB: 1941/04/24 DOA: 12/29/2019  PCP: System, Pcp Not In  Admit date: 12/29/2019 Discharge date: 12/31/2019  Time spent: 35 minutes  Recommendations for Outpatient Follow-up:  1. Repeat basic metabolic panel to follow electrolytes renal function 2. Reassess blood pressure and adjust antihypertensive regimen as needed.   Discharge Diagnoses:  Active Problems:   HIV infection ([place])   Cystitis   UTI (urinary tract infection)   Acute renal failure superimposed on stage 3b chronic kidney disease ([place])   Discharge Condition: Stable and improved.  Patient discharged home with instruction to follow-up with PCP in 10 days.  CODE STATUS: Full code.  Diet recommendation: Heart healthy diet.  Filed Weights   12/29/19 1556 12/30/19 0004  Weight: 83.9 kg 86.1 kg    History of present illness:  As per H&P written by Dr. Darrick Meigs on 12/29/2019 79 y.o.female,with history of COPD, CKD stage III, anxiety, HIV, last viral load undetectable, IBS, prior bowel obstruction, bowel perforation s/p partial colectomy with colostomy in place, cholecystectomy, abdominal hysterectomy and tubal ligation came to ED with complaints of dysuria and abdominal pain for past 1 week. Patient said that she has been having dysuria, urgency frequency and intermittent lower abdominal pain with back pain for past 1 week. She saw her PCP 1 week prior who started her on antibiotics for UTI. She has been taking antibiotics for just less than a week without any significant improvement. She called her PCP who told her to come to the ED for further evaluation. Denies vomiting but complains of nausea. Denies fever but has chills. In the ED she was found to have abnormal UA, started on IV ceftriaxone. CT abdomen and pelvis was done which showed no perinephric stranding to indicate pyelonephritis. It showed diffuse urinary bladder wall thickening with prominent perivascular fat  stranding suspicious for cystitis.  Hospital Course:  1-E. coli UTI -Patient has failed the use of Macrobid as an outpatient to clear infection -Imaging studies demonstrating cystitis without pyelonephritis. -Currently afebrile but still significantly symptomatic -Urine culture demonstrating E. coli microorganism no growth no significant for sensitivity; most likely as she was already using some antibiotics prior to being admitted. -Antibiotics transition to oral regimen; patient advised to maintain adequate hydration. -Low-dose pyridium daily to be given for 2 dosages in order to assist with patient symptomatology.  2-abdominal pain -Most likely associated with cystitis -Continue treatment as mentioned above and continue as needed analgesics.  3-history of HIV -Continue antiretroviral therapy. -Continue outpatient follow-up with infectious disease service.  4-acute on chronic kidney disease stage IIIb -In the setting of dehydration and UTI -IV fluids provided; we avoid/minimize the use of nephrotoxic agents. -Creatinine improved at time of discharge. -Advised to maintain adequate hydration -Complete treatment for urinary infection with oral antibiotics. -Repeat basic metabolic panel follow-up visit to reassess stability  5-hypertension -Continue the use of Bystolic and continue holding Lasix for now. -Advised to follow heart healthy diet.  6-history of depression/anxiety -Mood currently stable -Continue Elavil and as needed lorazepam. -No suicidal ideation or hallucinations.  Procedures:  See below for x-ray reports.  Consultations:  None  Discharge Exam: Vitals:   12/30/19 2154 12/31/19 0530  BP: (!) 157/61 (!) 144/56  Pulse: (!) 56 (!) 55  Resp: 18 18  Temp: 98.8 F (37.1 C) 98.9 F (37.2 C)  SpO2: 96% 95%    General: Afebrile, reporting significant improvement in the burning sensation with urination; no nausea, no vomiting, no chest pain  or shortness  of breath. Cardiovascular: S1 and S2, no rubs, no gallops, no murmurs.  No JVD on exam. Respiratory: Good air movement bilaterally, no wheezing, no crackles. Abdomen: Soft, positive bowel sounds, no guarding, no tenderness on palpation. Extremities: No edema, no cyanosis or clubbing.  Discharge Instructions   Discharge Instructions    Diet - low sodium heart healthy   Complete by: As directed    Discharge instructions   Complete by: As directed    Keep yourself well-hydrated Take medications as prescribed Arrange follow-up with PCP in 10 days Follow heart healthy diet   Increase activity slowly   Complete by: As directed      Allergies as of 12/31/2019      Reactions   Bee Venom Anaphylaxis   Promethazine Swelling   Tenofovir Disoproxil Fumarate [tenofovir Disoproxil Fumarate] Other (See Comments)   Renal failure, (08/27/2006)   Compazine  [prochlorperazine Edisylate] Nausea And Vomiting   Haloperidol Lactate Other (See Comments)   Reaction is muscle tension, causes severe spasms in face and neck. Forces eyes to roll in the back of the head   Brompheniramine-acetaminophen    Chlorcyclizine    Chlorpromazine    Codeine Itching, Nausea And Vomiting   Navane [thiothixene] Other (See Comments), Nausea And Vomiting   Same muscle spasm reaction as haldol   Other    NOVACAINE   Prednisone    Brief mild psychosis and agitation   Promethazine Hcl Other (See Comments)   Propoxyphene N-acetaminophen Itching, Nausea And Vomiting   Morphine Itching, Swelling      Medication List    STOP taking these medications   ibuprofen 200 MG tablet Commonly known as: ADVIL   nitrofurantoin (macrocrystal-monohydrate) 100 MG capsule Commonly known as: MACROBID     TAKE these medications   abacavir-dolutegravir-lamiVUDine 600-50-300 MG tablet Commonly known as: Triumeq Take 1 tablet by mouth daily.   albuterol 108 (90 Base) MCG/ACT inhaler Commonly known as: VENTOLIN HFA Inhale 1-2  puffs into the lungs every 6 (six) hours as needed for wheezing or shortness of breath.   amitriptyline 50 MG tablet Commonly known as: ELAVIL Take 1 tablet (50 mg total) by mouth at bedtime.   Benadryl 25 mg capsule Generic drug: diphenhydrAMINE Take 25 mg by mouth daily as needed for itching (itching from codeine).   cefdinir 300 MG capsule Commonly known as: OMNICEF Take 1 capsule (300 mg total) by mouth every 12 (twelve) hours for 5 days.   docusate sodium 100 MG capsule Commonly known as: COLACE Take 1 capsule (100 mg total) by mouth 2 (two) times daily.   Fish Oil Burp-Less 1000 MG Caps Take 1 capsule by mouth daily.   fluocinonide-emollient 0.05 % cream Commonly known as: LIDEX-E Apply 1 application topically 2 (two) times daily. What changed:   when to take this  reasons to take this   fluticasone 50 MCG/ACT nasal spray Commonly known as: FLONASE 2 SPRAYS INTO BOTH NOSTRILS AT BEDTIME. What changed: See the new instructions.   furosemide 40 MG tablet Commonly known as: LASIX Take 1 tablet (40 mg total) by mouth daily as needed for fluid. Hold until follow up with PCP What changed: additional instructions   ipratropium-albuterol 0.5-2.5 (3) MG/3ML Soln Commonly known as: DUONEB Inhale 3 mLs into the lungs every 6 (six) hours as needed (shortness of breath).   LORazepam 0.5 MG tablet Commonly known as: ATIVAN Take 0.5 mg by mouth 2 (two) times daily.   magnesium 30 MG tablet Take  30 mg by mouth at bedtime.   nebivolol 10 MG tablet Commonly known as: BYSTOLIC Take 10 mg by mouth daily.   ondansetron 8 MG tablet Commonly known as: ZOFRAN Take 8 mg by mouth daily as needed for nausea or vomiting.   ONE-A-DAY 55 PLUS PO Take 1 tablet by mouth daily.   Oxycodone HCl 10 MG Tabs Take 10 mg by mouth 2 (two) times daily as needed (for pain).   phenazopyridine 100 MG tablet Commonly known as: PYRIDIUM Take 1 tablet (100 mg total) by mouth once for 1  dose. Start taking on: January 01, 2020 What changed:   medication strength  how much to take  when to take this   polyvinyl alcohol 1.4 % ophthalmic solution Commonly known as: LIQUIFILM TEARS Place 1 drop into both eyes daily.   solifenacin 10 MG tablet Commonly known as: VESIcare Take 1 tablet (10 mg total) by mouth daily.   thiamine 100 MG tablet Commonly known as: VITAMIN B-1 Take 100 mg by mouth daily.   Vitamin D3 250 MCG (10000 UT) Tabs Take 1 tablet by mouth daily.      Allergies  Allergen Reactions  . Bee Venom Anaphylaxis  . Promethazine Swelling  . Tenofovir Disoproxil Fumarate [Tenofovir Disoproxil Fumarate] Other (See Comments)    Renal failure, (08/27/2006)  . Compazine  [Prochlorperazine Edisylate] Nausea And Vomiting  . Haloperidol Lactate Other (See Comments)    Reaction is muscle tension, causes severe spasms in face and neck. Forces eyes to roll in the back of the head  . Brompheniramine-Acetaminophen   . Chlorcyclizine   . Chlorpromazine   . Codeine Itching and Nausea And Vomiting  . Navane [Thiothixene] Other (See Comments) and Nausea And Vomiting    Same muscle spasm reaction as haldol  . Other     NOVACAINE  . Prednisone     Brief mild psychosis and agitation  . Promethazine Hcl Other (See Comments)  . Propoxyphene N-Acetaminophen Itching and Nausea And Vomiting  . Morphine Itching and Swelling   Follow-up Information    [name], Lavell Islam, MD. Schedule an appointment as soon as possible for a visit in 10 day(s).   Specialty: Infectious Diseases Contact information: [address] [address] [phone]        Arnoldo Lenis, MD .   Specialty: Cardiology Contact information: [address] [place] [address] [phone]           The results of significant diagnostics from this hospitalization (including imaging, microbiology, ancillary and laboratory) are listed below for reference.     Significant Diagnostic Studies: CT ABDOMEN PELVIS WO CONTRAST  Result Date: 12/29/2019 CLINICAL DATA:  Unspecified abdominal pain. Pelvic pain EXAM: CT ABDOMEN AND PELVIS WITHOUT CONTRAST TECHNIQUE: Multidetector CT imaging of the abdomen and pelvis was performed following the standard protocol without IV contrast. COMPARISON:  CT 11/12/2018 FINDINGS: Lower chest: Subsegmental atelectasis in the lower lobes. No pleural fluid. There mitral annulus calcifications. Hepatobiliary: No focal hepatic abnormality. Clips in the gallbladder fossa postcholecystectomy. No biliary dilatation. Pancreas: Parenchymal atrophy. No ductal dilatation or inflammation. Spleen: Normal in size without focal abnormality. Adrenals/Urinary Tract: Stable 19 mm left adrenal nodule with macroscopic fat consistent with myelolipoma, benign. Normal right adrenal gland. Thinning of bilateral renal parenchyma without hydronephrosis. Left renal collecting system appears duplicated. Possible punctate nonobstructing stone in the upper left kidney. No significant perinephric edema. Diffuse urinary bladder wall thickening with perivesicular fat stranding. Stomach/Bowel: Colostomy in the  left lower quadrant. In stapled of sigmoid colon in the pelvis with diverticulosis, no diverticulitis. Moderate stool in the more proximal colon. No small bowel inflammation or obstruction. Right periumbilical hernia contains nonobstructed noninflamed small bowel. Appendix is not confidently visualized, no evidence of appendicitis. Ingested contrast in the stomach with small hiatal hernia containing contrast. Vascular/Lymphatic: Aortic atherosclerosis and tortuosity. No aortic aneurysm. No abdominopelvic adenopathy. Reproductive: Post hysterectomy. Small tubular structure in the right adnexa was present on prior exam measuring 1.8 x 3.6 cm. Stability over greater than 1 year suggest benign process. No suspicious adnexal mass. Other: Perivesicular fat stranding in the  pelvis. No significant free fluid. Bilateral fat containing inguinal hernias. Right periumbilical hernia contains nonobstructed small bowel. No free air. Musculoskeletal: Degenerative change in the spine with degenerative disc disease and facet hypertrophy. There are no acute or suspicious osseous abnormalities. IMPRESSION: 1. Diffuse urinary bladder wall thickening with prominent perivesicular fat stranding, suspicious for cystitis. No perinephric stranding to suspect pyelonephritis. 2. Right periumbilical hernia contains nonobstructed noninflamed small bowel. Bilateral fat containing inguinal hernias. 3. Distal colonic diverticulosis without diverticulitis. Small hiatal hernia contains small amount of enteric contrast, which may reflect reflux or delayed transit. 4. Left lower quadrant colostomy with diverticulosis of the Hartmann's pouch. No acute diverticulitis. 5. Additional chronic findings are stable from prior, as described. Aortic Atherosclerosis (ICD10-I70.0). Electronically Signed   By: Keith Rake M.D.   On: 12/29/2019 21:09    Microbiology: Recent Results (from the past 240 hour(s))  Urine culture     Status: Abnormal   Collection Time: 12/29/19  5:46 PM   Specimen: Urine, Clean Catch  Result Value Ref Range Status   Specimen Description   Final    URINE, CLEAN CATCH Performed at [facility], [address], [place], [place] [address]    Special Requests   Final    NONE Performed at [facility], [address], [place], [place] [address]    Culture (A)  Final    <10,000 COLONIES/mL INSIGNIFICANT GROWTH Performed at [facility] [address], [place], [place] [address]    Report Status 12/30/2019 FINAL  Final  Wet prep, genital     Status: Abnormal   Collection Time: 12/29/19  7:45 PM  Result Value Ref Range Status   Yeast Wet Prep HPF POC NONE SEEN NONE SEEN Final   Trich, Wet Prep NONE SEEN NONE SEEN Final   Clue Cells Wet Prep HPF POC NONE SEEN NONE SEEN Final    WBC, Wet Prep HPF POC FEW (A) NONE SEEN Final   Sperm NONE SEEN  Final    Comment: Performed at [facility], [address] [place], [place] [address]  SARS CORONAVIRUS 2 (TAT 6-24 HRS) Nasopharyngeal Nasopharyngeal Swab     Status: None   Collection Time: 12/29/19 10:11 PM   Specimen: Nasopharyngeal Swab  Result Value Ref Range Status   SARS Coronavirus 2 NEGATIVE NEGATIVE Final    Comment: (NOTE) SARS-CoV-2 target nucleic acids are NOT DETECTED. The SARS-CoV-2 RNA is generally detectable in upper and lower respiratory specimens during the acute phase of infection. Negative results do not preclude SARS-CoV-2 infection, do not rule out co-infections with other pathogens, and should not be used as the sole basis for treatment or other patient management decisions. Negative results must be combined with clinical observations, patient history, and epidemiological information. The expected result is Negative. Fact Sheet for Patients: SugarRoll.be Fact Sheet for Healthcare Providers: https://www.woods-mathews.com/ This test is not yet  approved or cleared by the [place] and  has been authorized for detection and/or diagnosis of SARS-CoV-2 by FDA under an Emergency Use Authorization (EUA). This EUA will remain  in effect (meaning this test can be used) for the duration of the COVID-19 declaration under Section 56 4(b)(1) of the Act, 21 U.S.C. section 360bbb-3(b)(1), unless the authorization is terminated or revoked sooner. Performed at [facility], [place] [address] [place], [place] [address]      Labs: Basic Metabolic Panel: Recent Labs  Lab 12/29/19 1730 12/30/19 0642   NA 141 140   K 4.9 3.9   CL 107 104   CO2 27 26   GLUCOSE 101* 100*   BUN 27* 23   CREATININE 1.73* 1.54*   CALCIUM 10.2 9.9    Liver Function Tests: Recent Labs  Lab 12/29/19 1730 12/30/19 0642  AST 26 26  ALT 24 26  ALKPHOS 63 66  BILITOT  1.0 1.2  PROT 7.2 7.2  ALBUMIN 4.5 4.3   CBC: Recent Labs  Lab 12/29/19 1730 12/30/19 0642  WBC 7.0 7.1  NEUTROABS 3.3  --   HGB 14.2 15.2*  HCT 42.5 46.4*  MCV 102.9* 103.8*  PLT 159 228    Signed:  Barton Dubois MD.  [facility] 12/31/2019, 10:33 AM

## 2019-12-31 NOTE — Progress Notes (Signed)
Nsg Discharge Note  Admit Date:  12/29/2019 Discharge date: 12/31/2019   Garnet Koyanagi [place] to be D/C'd  Home per MD order.  AVS completed.  Patient able to verbalize understanding.  Discharge Medication: Allergies as of 12/31/2019      Reactions   Bee Venom Anaphylaxis   Promethazine Swelling   Tenofovir Disoproxil Fumarate [tenofovir Disoproxil Fumarate] Other (See Comments)   Renal failure, (08/27/2006)   Compazine  [prochlorperazine Edisylate] Nausea And Vomiting   Haloperidol Lactate Other (See Comments)   Reaction is muscle tension, causes severe spasms in face and neck. Forces eyes to roll in the back of the head   Brompheniramine-acetaminophen    Chlorcyclizine    Chlorpromazine    Codeine Itching, Nausea And Vomiting   Navane [thiothixene] Other (See Comments), Nausea And Vomiting   Same muscle spasm reaction as haldol   Other    NOVACAINE   Prednisone    Brief mild psychosis and agitation   Promethazine Hcl Other (See Comments)   Propoxyphene N-acetaminophen Itching, Nausea And Vomiting   Morphine Itching, Swelling      Medication List    STOP taking these medications   ibuprofen 200 MG tablet Commonly known as: ADVIL   nitrofurantoin (macrocrystal-monohydrate) 100 MG capsule Commonly known as: MACROBID     TAKE these medications   abacavir-dolutegravir-lamiVUDine 600-50-300 MG tablet Commonly known as: Triumeq Take 1 tablet by mouth daily.   albuterol 108 (90 Base) MCG/ACT inhaler Commonly known as: VENTOLIN HFA Inhale 1-2 puffs into the lungs every 6 (six) hours as needed for wheezing or shortness of breath.   amitriptyline 50 MG tablet Commonly known as: ELAVIL Take 1 tablet (50 mg total) by mouth at bedtime.   Benadryl 25 mg capsule Generic drug: diphenhydrAMINE Take 25 mg by mouth daily as needed for itching (itching from codeine).   cefdinir 300 MG capsule Commonly known as: OMNICEF Take 1 capsule (300 mg total) by mouth every 12 (twelve) hours for 5  days.   docusate sodium 100 MG capsule Commonly known as: COLACE Take 1 capsule (100 mg total) by mouth 2 (two) times daily.   Fish Oil Burp-Less 1000 MG Caps Take 1 capsule by mouth daily.   fluocinonide-emollient 0.05 % cream Commonly known as: LIDEX-E Apply 1 application topically 2 (two) times daily. What changed:   when to take this  reasons to take this   fluticasone 50 MCG/ACT nasal spray Commonly known as: FLONASE 2 SPRAYS INTO BOTH NOSTRILS AT BEDTIME. What changed: See the new instructions.   furosemide 40 MG tablet Commonly known as: LASIX Take 1 tablet (40 mg total) by mouth daily as needed for fluid. Hold until follow up with PCP What changed: additional instructions   ipratropium-albuterol 0.5-2.5 (3) MG/3ML Soln Commonly known as: DUONEB Inhale 3 mLs into the lungs every 6 (six) hours as needed (shortness of breath).   LORazepam 0.5 MG tablet Commonly known as: ATIVAN Take 0.5 mg by mouth 2 (two) times daily.   magnesium 30 MG tablet Take 30 mg by mouth at bedtime.   nebivolol 10 MG tablet Commonly known as: BYSTOLIC Take 10 mg by mouth daily.   ondansetron 8 MG tablet Commonly known as: ZOFRAN Take 8 mg by mouth daily as needed for nausea or vomiting.   ONE-A-DAY 55 PLUS PO Take 1 tablet by mouth daily.   Oxycodone HCl 10 MG Tabs Take 10 mg by mouth 2 (two) times daily as needed (for pain).   phenazopyridine 100 MG  tablet Commonly known as: PYRIDIUM Take 1 tablet (100 mg total) by mouth once for 1 dose. Start taking on: January 01, 2020 What changed:   medication strength  how much to take  when to take this   polyvinyl alcohol 1.4 % ophthalmic solution Commonly known as: LIQUIFILM TEARS Place 1 drop into both eyes daily.   solifenacin 10 MG tablet Commonly known as: VESIcare Take 1 tablet (10 mg total) by mouth daily.   thiamine 100 MG tablet Commonly known as: VITAMIN B-1 Take 100 mg by mouth daily.   Vitamin D3 250 MCG  (10000 UT) Tabs Take 1 tablet by mouth daily.       Discharge Assessment: Vitals:   12/30/19 2154 12/31/19 0530  BP: (!) 157/61 (!) 144/56  Pulse: (!) 56 (!) 55  Resp: 18 18  Temp: 98.8 F (37.1 C) 98.9 F (37.2 C)  SpO2: 96% 95%   Skin clean, dry and intact without evidence of skin break down, no evidence of skin tears noted. IV catheter discontinued intact. Site without signs and symptoms of complications - no redness or edema noted at insertion site, patient denies c/o pain - only slight tenderness at site.  Dressing with slight pressure applied.  D/c Instructions-Education: Discharge instructions given to patient with verbalized understanding. D/c education completed with patient including follow up instructions, medication list, d/c activities limitations if indicated, with other d/c instructions as indicated by MD - patient able to verbalize understanding, all questions fully answered. Patient instructed to return to ED, call 911, or call MD for any changes in condition.  Patient escorted via [place], and D/C home via private auto.  Jovita Persing Loletha Grayer, RN 12/31/2019 11:28 AM

## 2020-01-03 ENCOUNTER — Other Ambulatory Visit: Payer: Self-pay | Admitting: *Deleted

## 2020-01-03 DIAGNOSIS — B20 Human immunodeficiency virus [HIV] disease: Secondary | ICD-10-CM

## 2020-01-03 MED ORDER — TRIUMEQ 600-50-300 MG PO TABS: 1.0000 | ORAL_TABLET | Freq: Every day | ORAL | 1 refills | Status: DC

## 2020-01-03 NOTE — Progress Notes (Signed)
Patient requested [facility] reach out for medication refills.  RN sent 90 day quantity with 1 refill, asked pharmacy to remind patient to call and set up her annual visit. Landis Gandy, RN

## 2020-01-11 DIAGNOSIS — Z09 Encounter for follow-up examination after completed treatment for conditions other than malignant neoplasm: Secondary | ICD-10-CM | POA: Diagnosis not present

## 2020-01-11 DIAGNOSIS — M5137 Other intervertebral disc degeneration, lumbosacral region: Secondary | ICD-10-CM | POA: Diagnosis not present

## 2020-01-25 ENCOUNTER — Ambulatory Visit: Payer: Medicare HMO | Admitting: Cardiology

## 2020-01-25 NOTE — Progress Notes (Deleted)
Clinical Summary [name] is a 79 y.o.female seen today for follow up of the following medical problems.    1. Afib - new diagnosis during Jan 2020 admission - occurred postop after abdominal surgery - short duration of afib <6 hrs. Was not committed to anticoag given isolated episode postop. CHADS2Vasc score is 77 (age x2, gender, TIA x 2, HTN.  - has had some palpitations. Occurs maybe once a month. Lasts just a few minutes, some dizziness. Tends to happen with getting nervous only, no other times  2. HTN - has not taken meds yet  3. CKD 3   4. History of TIAs   5. Preoperative evaluation - history of partial colectomy with segmental resection of the proximal descending colon and end colostomy for sterocoral ulcer in January 2020.  - limited by chronic back pain - no issues with surgery in Jan   Past Medical History:  Diagnosis Date  . Anxiety   . Arthritis   . Bronchitis   . CKD (chronic kidney disease) stage 3, GFR 30-59 ml/min 05/04/2016  . COPD (chronic obstructive pulmonary disease) ([place])   . Depression   . Dysuria 03/27/2019  . Elevated LFTs 05/04/2016  . History of TIAs 1981   left side weakness  . HIV (human immunodeficiency virus infection) ([place])   . Hypertension   . Kidney disease related to HIV infection [facility])    pt states she has to have her creatinine level checked often   . Nausea 03/03/2017  . Peripheral neuropathy    bilateral feet  . Seasonal allergies 03/27/2019  . Sinusitis 05/06/2018  . UTI (urinary tract infection) 03/03/2017  . Weight loss 03/03/2017     Allergies  Allergen Reactions  . Bee Venom Anaphylaxis  . Promethazine Swelling  . Tenofovir Disoproxil Fumarate [Tenofovir Disoproxil Fumarate] Other (See Comments)    Renal failure, (08/27/2006)  . Compazine  [Prochlorperazine Edisylate] Nausea And Vomiting  . Haloperidol Lactate Other (See Comments)    Reaction is muscle tension, causes severe spasms in face and neck. Forces  eyes to roll in the back of the head  . Brompheniramine-Acetaminophen   . Chlorcyclizine   . Chlorpromazine   . Codeine Itching and Nausea And Vomiting  . Navane [Thiothixene] Other (See Comments) and Nausea And Vomiting    Same muscle spasm reaction as haldol  . Other     NOVACAINE  . Prednisone     Brief mild psychosis and agitation  . Promethazine Hcl Other (See Comments)  . Propoxyphene N-Acetaminophen Itching and Nausea And Vomiting  . Morphine Itching and Swelling     Current Outpatient Medications  Medication Sig Dispense Refill  . abacavir-dolutegravir-lamiVUDine (TRIUMEQ) 600-50-300 MG tablet Take 1 tablet by mouth daily. 90 tablet 1  . albuterol (PROVENTIL HFA;VENTOLIN HFA) 108 (90 Base) MCG/ACT inhaler Inhale 1-2 puffs into the lungs every 6 (six) hours as needed for wheezing or shortness of breath. 1 Inhaler 0  . amitriptyline (ELAVIL) 50 MG tablet Take 1 tablet (50 mg total) by mouth at bedtime. 30 tablet 0  . Cholecalciferol (VITAMIN D3) 10000 units TABS Take 1 tablet by mouth daily.    . diphenhydrAMINE (BENADRYL) 25 mg capsule Take 25 mg by mouth daily as needed for itching (itching from codeine).     Marland Kitchen docusate sodium (COLACE) 100 MG capsule Take 1 capsule (100 mg total) by mouth 2 (two) times daily. 10 capsule 0  . fluocinonide-emollient (LIDEX-E) 0.05 % cream Apply 1 application topically  2 (two) times daily. (Patient taking differently: Apply 1 application topically 2 (two) times daily as needed (for skin irritation). ) 30 g 0  . fluticasone (FLONASE) 50 MCG/ACT nasal spray 2 SPRAYS INTO BOTH NOSTRILS AT BEDTIME. (Patient taking differently: Place 2 sprays into both nostrils at bedtime. ) 16 g 5  . furosemide (LASIX) 40 MG tablet Take 1 tablet (40 mg total) by mouth daily as needed for fluid. Hold until follow up with PCP    . ipratropium-albuterol (DUONEB) 0.5-2.5 (3) MG/3ML SOLN Inhale 3 mLs into the lungs every 6 (six) hours as needed (shortness of breath).     .  LORazepam (ATIVAN) 0.5 MG tablet Take 0.5 mg by mouth 2 (two) times daily.     . magnesium 30 MG tablet Take 30 mg by mouth at bedtime.     . Multiple Vitamin (ONE-A-DAY 55 PLUS PO) Take 1 tablet by mouth daily.    . nebivolol (BYSTOLIC) 10 MG tablet Take 10 mg by mouth daily.     . Omega-3 Fatty Acids (FISH OIL BURP-LESS) 1000 MG CAPS Take 1 capsule by mouth daily.    . ondansetron (ZOFRAN) 8 MG tablet Take 8 mg by mouth daily as needed for nausea or vomiting.     . Oxycodone HCl 10 MG TABS Take 10 mg by mouth 2 (two) times daily as needed (for pain).    . polyvinyl alcohol (LIQUIFILM TEARS) 1.4 % ophthalmic solution Place 1 drop into both eyes daily.     . solifenacin (VESICARE) 10 MG tablet Take 1 tablet (10 mg total) by mouth daily. 30 tablet 11  . thiamine (VITAMIN B-1) 100 MG tablet Take 100 mg by mouth daily.     No current facility-administered medications for this visit.     Past Surgical History:  Procedure Laterality Date  . ABDOMINAL HYSTERECTOMY  1985  . BACK SURGERY    . CATARACT EXTRACTION, BILATERAL    . CHOLECYSTECTOMY    . COLONOSCOPY  08/10/2002   NUR: Normal colonoscopy except for some hemorrhoids and some erythema at the dentate line  . COLONOSCOPY  12/10/2006   Edwards:Diffuse colitis in the transverse and descending colon/This is not entirely typical of ischemic colitis or her HIV positive/ disease.  We need to be concerned about other causes  . COLONOSCOPY  01/19/2011   RMR: Internal and external hemorrhoids, likely source of hematochezia anal papilla, otherwise normal rectal mucosa/ Left-sided diverticula.  Cecal polyp with status post cold snare polypectomy.  Remainder of colonic mucosa appeared normal. Pathology with tubular adenoma. Repeat in 2017  . COLOSTOMY N/A 11/12/2018   Procedure: COLOSTOMY;  Surgeon: Virl Cagey, MD;  Location: [facility];  Service: General;  Laterality: N/A;  . ESOPHAGOGASTRODUODENOSCOPY  08/10/2002     NUR: Mild changes of reflux  esophagitis limited to gastroesophageal junction  No evidence of ring, stricture, or esophageal candidiasis dysphagia/ Gastritis, possibly H. pylori induced  . ESOPHAGOGASTRODUODENOSCOPY  12/25/2002   NUR: Erosive antral gastritis.  The degree of gastritis is more significant than her last exam/ Normal examination of the esophagus/ Esophageal dilatation performed by passing 63 and 7 [place]  . ESOPHAGOGASTRODUODENOSCOPY  11/13/2010   RMR: Circumferential distal esophageal erosions with soft peptic stricture, consistent with erosive reflux esophagitis or stricture  formation, status post dilation as described above/  Small hiatal hernia/ Tiny antral erosions, otherwise normal stomach D1 and D2  . HEMORRHOID SURGERY  09/02/2012   Procedure: HEMORRHOIDECTOMY;  Surgeon: Jamesetta So, MD;  Location: [facility];  Service: General;  Laterality: N/A;  . LAPAROTOMY N/A 11/12/2018   Procedure: EXPLORATORY LAPAROTOMY;  Surgeon: Virl Cagey, MD;  Location: [facility];  Service: General;  Laterality: N/A;  . PARTIAL COLECTOMY N/A 11/12/2018   Procedure: PARTIAL COLECTOMY;  Surgeon: Virl Cagey, MD;  Location: [facility];  Service: General;  Laterality: N/A;  . TUBAL LIGATION       Allergies  Allergen Reactions  . Bee Venom Anaphylaxis  . Promethazine Swelling  . Tenofovir Disoproxil Fumarate [Tenofovir Disoproxil Fumarate] Other (See Comments)    Renal failure, (08/27/2006)  . Compazine  [Prochlorperazine Edisylate] Nausea And Vomiting  . Haloperidol Lactate Other (See Comments)    Reaction is muscle tension, causes severe spasms in face and neck. Forces eyes to roll in the back of the head  . Brompheniramine-Acetaminophen   . Chlorcyclizine   . Chlorpromazine   . Codeine Itching and Nausea And Vomiting  . Navane [Thiothixene] Other (See Comments) and Nausea And Vomiting    Same muscle spasm reaction as haldol  . Other     NOVACAINE  . Prednisone     Brief mild psychosis and agitation  .  Promethazine Hcl Other (See Comments)  . Propoxyphene N-Acetaminophen Itching and Nausea And Vomiting  . Morphine Itching and Swelling      Family History  Problem Relation Age of Onset  . Diabetes Mother   . Diabetes Brother   . Colon cancer Brother        Diagnosed > age 23     Social History Ms. Jastrzebski reports that she has never smoked. She has never used smokeless tobacco. Ms. Reames reports no history of alcohol use.   Review of Systems CONSTITUTIONAL: No weight loss, fever, chills, weakness or fatigue.  HEENT: Eyes: No visual loss, blurred vision, double vision or yellow sclerae.No hearing loss, sneezing, congestion, runny nose or sore throat.  SKIN: No rash or itching.  CARDIOVASCULAR:  RESPIRATORY: No shortness of breath, cough or sputum.  GASTROINTESTINAL: No anorexia, nausea, vomiting or diarrhea. No abdominal pain or blood.  GENITOURINARY: No burning on urination, no polyuria NEUROLOGICAL: No headache, dizziness, syncope, paralysis, ataxia, numbness or tingling in the extremities. No change in bowel or bladder control.  MUSCULOSKELETAL: No muscle, back pain, joint pain or stiffness.  LYMPHATICS: No enlarged nodes. No history of splenectomy.  PSYCHIATRIC: No history of depression or anxiety.  ENDOCRINOLOGIC: No reports of sweating, cold or heat intolerance. No polyuria or polydipsia.  Marland Kitchen   Physical Examination There were no vitals filed for this visit. There were no vitals filed for this visit.  Gen: resting comfortably, no acute distress HEENT: no scleral icterus, pupils equal round and reactive, no palptable cervical adenopathy,  CV Resp: Clear to auscultation bilaterally GI: abdomen is soft, non-tender, non-distended, normal bowel sounds, no hepatosplenomegaly MSK: extremities are warm, no edema.  Skin: warm, no rash Neuro:  no focal deficits Psych: appropriate affect   Diagnostic Studies Jan 2020 echo Study Conclusions  - Left ventricle: The  cavity size was normal. Wall thickness was increased in a pattern of moderate LVH. Systolic function was normal. The estimated ejection fraction was in the range of 60% to 65%. Wall motion was normal; there were no regional wall motion abnormalities. Doppler parameters are consistent with abnormal left ventricular relaxation (grade 1 diastolic dysfunction). - Aortic valve: Mildly calcified annulus. Trileaflet; mildly thickened leaflets. Valve area (VTI): 2.67 cm^2. Valve area (Vmax): 2.36 cm^2. - Mitral valve: Mildly calcified  annulus. Mildly thickened leaflets    Assessment and Plan   1. Postop Afib - isolated episode postop short in duration, no recurrence. She was very symptomatic when occurred in the hospital, no similar symptoms since. Only rare palpitatoins that come on with nerves or anxiety recently - continue to monitor at this time  2. HTN - elevated today but has not taken meds yet. BP from visit earlier in the month with GI was reasonable   3. Preoperative evaluation - no active cardopumlonary symptoms - tolerated major surgetry in January aside from isolated afib episode - recommend proceeding with colostomy revision as planned, no further cardiac testing warranted      Arnoldo Lenis, M.D., F.A.C.C.

## 2020-01-30 IMAGING — CR DG CHEST 1V PORT
1 series · 1 of 1 positions shown · non-contrast
Comparison: 11/15/2018

CLINICAL DATA: Pulmonary edema

EXAM:
PORTABLE CHEST 1 VIEW

[portable]
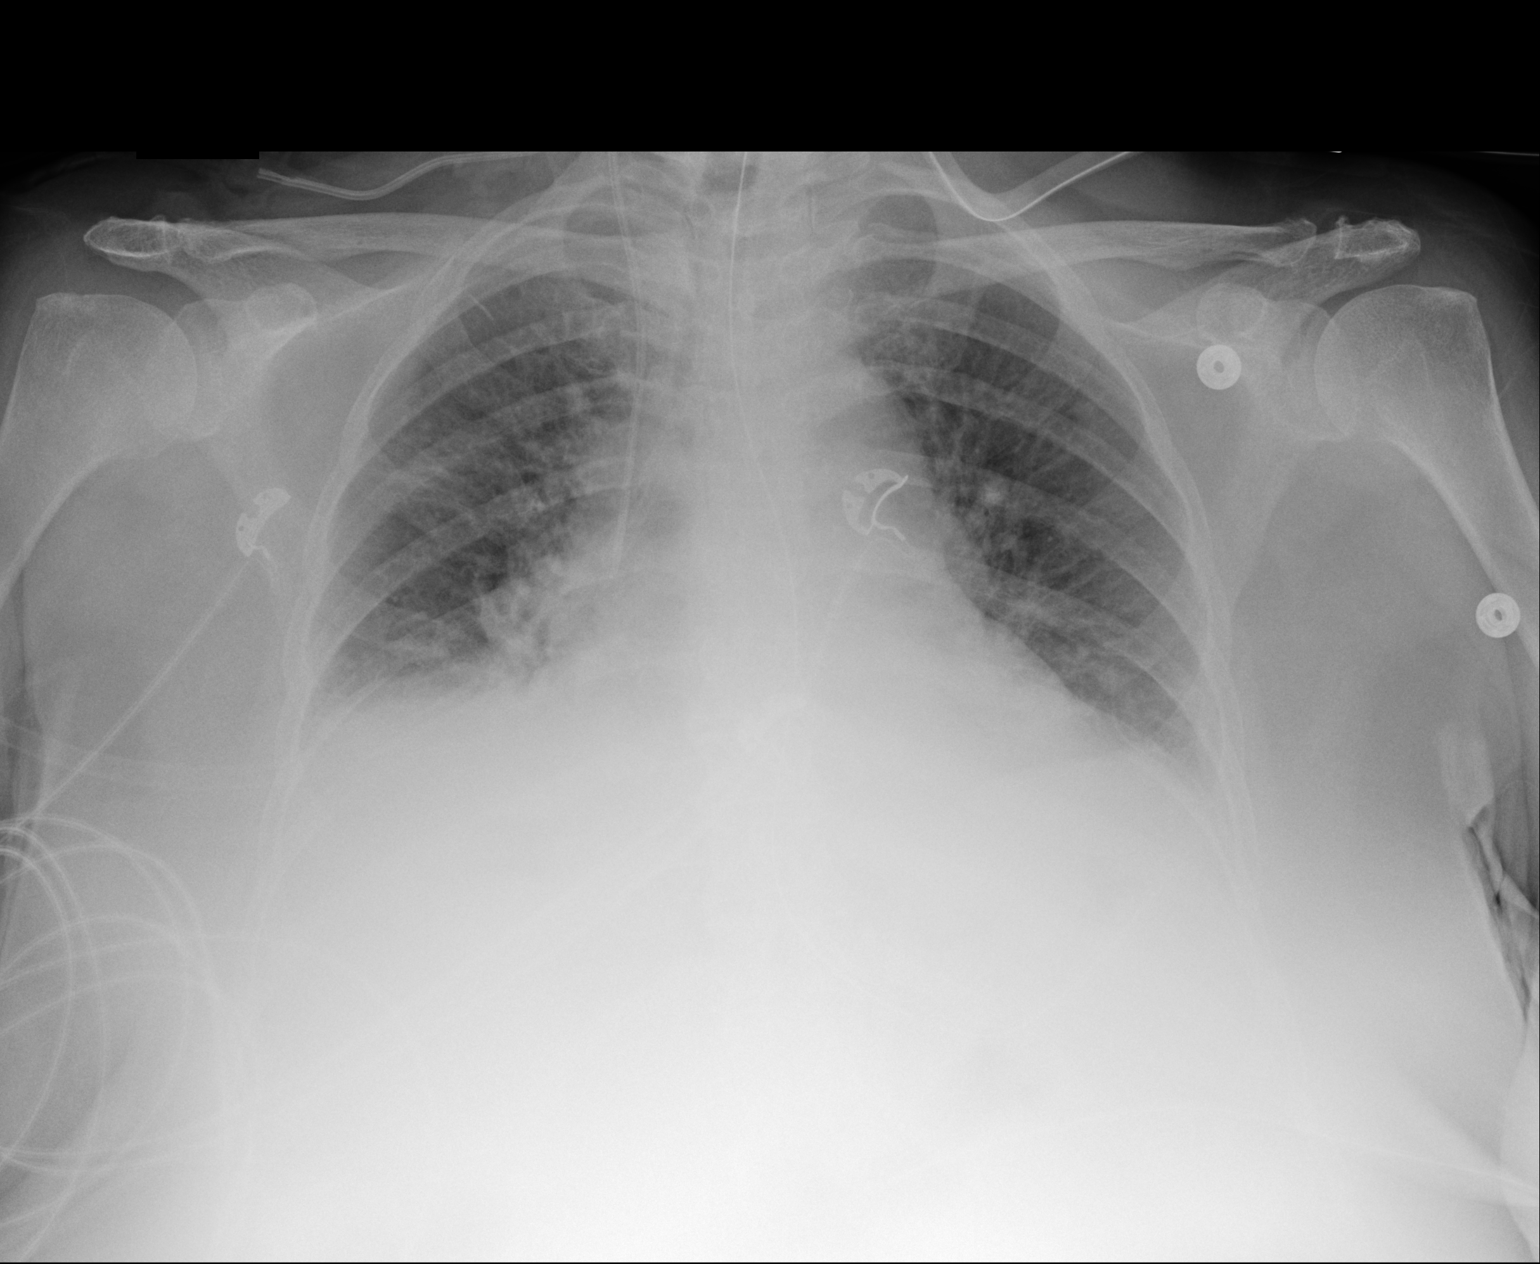

[1 of 1 positions shown; findings below may reference images not displayed]

FINDINGS: Cardiac shadow remains enlarged. Right jugular central line and
nasogastric catheter are again seen and stable. Stable interstitial
changes are noted similar to that seen on the prior exam. Mild right
basilar atelectasis is noted. No bony abnormality is seen.
IMPRESSION: No change from the previous exam.

## 2020-02-07 ENCOUNTER — Ambulatory Visit: Payer: Medicare HMO | Admitting: Cardiology

## 2020-02-07 NOTE — Progress Notes (Deleted)
Clinical Summary Ms. Ayars is a 79 y.o.female 1. Afib - new diagnosis during Jan 2020 admission - occurred postop after abdominal surgery - short duration of afib <6 hrs. Was not committed to anticoag given isolated episode postop. CHADS2Vasc score is 24 (age x2, gender, TIA x 2, HTN.  - has had some palpitations. Occurs maybe once a month. Lasts just a few minutes, some dizziness. Tends to happen with getting nervous only, no other times  2. HTN - has not taken meds yet  3. CKD 3   4. History of TIAs   5. Preoperative evaluation - history of partial colectomy with segmental resection of the proximal descending colon and end colostomy for sterocoral ulcer in January 2020.  - limited by chronic back pain - no issues with surgery in Jan   Past Medical History:  Diagnosis Date  . Anxiety   . Arthritis   . Bronchitis   . CKD (chronic kidney disease) stage 3, GFR 30-59 ml/min 05/04/2016  . COPD (chronic obstructive pulmonary disease) ([place])   . Depression   . Dysuria 03/27/2019  . Elevated LFTs 05/04/2016  . History of TIAs 1981   left side weakness  . HIV (human immunodeficiency virus infection) ([place])   . Hypertension   . Kidney disease related to HIV infection [facility])    pt states she has to have her creatinine level checked often   . Nausea 03/03/2017  . Peripheral neuropathy    bilateral feet  . Seasonal allergies 03/27/2019  . Sinusitis 05/06/2018  . UTI (urinary tract infection) 03/03/2017  . Weight loss 03/03/2017     Allergies  Allergen Reactions  . Bee Venom Anaphylaxis  . Promethazine Swelling  . Tenofovir Disoproxil Fumarate [Tenofovir Disoproxil Fumarate] Other (See Comments)    Renal failure, (08/27/2006)  . Compazine  [Prochlorperazine Edisylate] Nausea And Vomiting  . Haloperidol Lactate Other (See Comments)    Reaction is muscle tension, causes severe spasms in face and neck. Forces eyes to roll in the back of the head  .  Brompheniramine-Acetaminophen   . Chlorcyclizine   . Chlorpromazine   . Codeine Itching and Nausea And Vomiting  . Navane [Thiothixene] Other (See Comments) and Nausea And Vomiting    Same muscle spasm reaction as haldol  . Other     NOVACAINE  . Prednisone     Brief mild psychosis and agitation  . Promethazine Hcl Other (See Comments)  . Propoxyphene N-Acetaminophen Itching and Nausea And Vomiting  . Morphine Itching and Swelling     Current Outpatient Medications  Medication Sig Dispense Refill  . abacavir-dolutegravir-lamiVUDine (TRIUMEQ) 600-50-300 MG tablet Take 1 tablet by mouth daily. 90 tablet 1  . albuterol (PROVENTIL HFA;VENTOLIN HFA) 108 (90 Base) MCG/ACT inhaler Inhale 1-2 puffs into the lungs every 6 (six) hours as needed for wheezing or shortness of breath. 1 Inhaler 0  . amitriptyline (ELAVIL) 50 MG tablet Take 1 tablet (50 mg total) by mouth at bedtime. 30 tablet 0  . Cholecalciferol (VITAMIN D3) 10000 units TABS Take 1 tablet by mouth daily.    . diphenhydrAMINE (BENADRYL) 25 mg capsule Take 25 mg by mouth daily as needed for itching (itching from codeine).     Marland Kitchen docusate sodium (COLACE) 100 MG capsule Take 1 capsule (100 mg total) by mouth 2 (two) times daily. 10 capsule 0  . fluocinonide-emollient (LIDEX-E) 0.05 % cream Apply 1 application topically 2 (two) times daily. (Patient taking differently: Apply 1 application topically 2 (two)  times daily as needed (for skin irritation). ) 30 g 0  . fluticasone (FLONASE) 50 MCG/ACT nasal spray 2 SPRAYS INTO BOTH NOSTRILS AT BEDTIME. (Patient taking differently: Place 2 sprays into both nostrils at bedtime. ) 16 g 5  . furosemide (LASIX) 40 MG tablet Take 1 tablet (40 mg total) by mouth daily as needed for fluid. Hold until follow up with PCP    . ipratropium-albuterol (DUONEB) 0.5-2.5 (3) MG/3ML SOLN Inhale 3 mLs into the lungs every 6 (six) hours as needed (shortness of breath).     . LORazepam (ATIVAN) 0.5 MG tablet Take 0.5  mg by mouth 2 (two) times daily.     . magnesium 30 MG tablet Take 30 mg by mouth at bedtime.     . Multiple Vitamin (ONE-A-DAY 55 PLUS PO) Take 1 tablet by mouth daily.    . nebivolol (BYSTOLIC) 10 MG tablet Take 10 mg by mouth daily.     . Omega-3 Fatty Acids (FISH OIL BURP-LESS) 1000 MG CAPS Take 1 capsule by mouth daily.    . ondansetron (ZOFRAN) 8 MG tablet Take 8 mg by mouth daily as needed for nausea or vomiting.     . Oxycodone HCl 10 MG TABS Take 10 mg by mouth 2 (two) times daily as needed (for pain).    . polyvinyl alcohol (LIQUIFILM TEARS) 1.4 % ophthalmic solution Place 1 drop into both eyes daily.     . solifenacin (VESICARE) 10 MG tablet Take 1 tablet (10 mg total) by mouth daily. 30 tablet 11  . thiamine (VITAMIN B-1) 100 MG tablet Take 100 mg by mouth daily.     No current facility-administered medications for this visit.     Past Surgical History:  Procedure Laterality Date  . ABDOMINAL HYSTERECTOMY  1985  . BACK SURGERY    . CATARACT EXTRACTION, BILATERAL    . CHOLECYSTECTOMY    . COLONOSCOPY  08/10/2002   NUR: Normal colonoscopy except for some hemorrhoids and some erythema at the dentate line  . COLONOSCOPY  12/10/2006   Edwards:Diffuse colitis in the transverse and descending colon/This is not entirely typical of ischemic colitis or her HIV positive/ disease.  We need to be concerned about other causes  . COLONOSCOPY  01/19/2011   RMR: Internal and external hemorrhoids, likely source of hematochezia anal papilla, otherwise normal rectal mucosa/ Left-sided diverticula.  Cecal polyp with status post cold snare polypectomy.  Remainder of colonic mucosa appeared normal. Pathology with tubular adenoma. Repeat in 2017  . COLOSTOMY N/A 11/12/2018   Procedure: COLOSTOMY;  Surgeon: Virl Cagey, MD;  Location: [facility];  Service: General;  Laterality: N/A;  . ESOPHAGOGASTRODUODENOSCOPY  08/10/2002     NUR: Mild changes of reflux esophagitis limited to gastroesophageal  junction  No evidence of ring, stricture, or esophageal candidiasis dysphagia/ Gastritis, possibly H. pylori induced  . ESOPHAGOGASTRODUODENOSCOPY  12/25/2002   NUR: Erosive antral gastritis.  The degree of gastritis is more significant than her last exam/ Normal examination of the esophagus/ Esophageal dilatation performed by passing 43 and 30 [place]  . ESOPHAGOGASTRODUODENOSCOPY  11/13/2010   RMR: Circumferential distal esophageal erosions with soft peptic stricture, consistent with erosive reflux esophagitis or stricture  formation, status post dilation as described above/  Small hiatal hernia/ Tiny antral erosions, otherwise normal stomach D1 and D2  . HEMORRHOID SURGERY  09/02/2012   Procedure: HEMORRHOIDECTOMY;  Surgeon: Jamesetta So, MD;  Location: [facility];  Service: General;  Laterality: N/A;  . LAPAROTOMY  N/A 11/12/2018   Procedure: EXPLORATORY LAPAROTOMY;  Surgeon: Virl Cagey, MD;  Location: [facility];  Service: General;  Laterality: N/A;  . PARTIAL COLECTOMY N/A 11/12/2018   Procedure: PARTIAL COLECTOMY;  Surgeon: Virl Cagey, MD;  Location: [facility];  Service: General;  Laterality: N/A;  . TUBAL LIGATION       Allergies  Allergen Reactions  . Bee Venom Anaphylaxis  . Promethazine Swelling  . Tenofovir Disoproxil Fumarate [Tenofovir Disoproxil Fumarate] Other (See Comments)    Renal failure, (08/27/2006)  . Compazine  [Prochlorperazine Edisylate] Nausea And Vomiting  . Haloperidol Lactate Other (See Comments)    Reaction is muscle tension, causes severe spasms in face and neck. Forces eyes to roll in the back of the head  . Brompheniramine-Acetaminophen   . Chlorcyclizine   . Chlorpromazine   . Codeine Itching and Nausea And Vomiting  . Navane [Thiothixene] Other (See Comments) and Nausea And Vomiting    Same muscle spasm reaction as haldol  . Other     NOVACAINE  . Prednisone     Brief mild psychosis and agitation  . Promethazine Hcl Other (See Comments)    . Propoxyphene N-Acetaminophen Itching and Nausea And Vomiting  . Morphine Itching and Swelling      Family History  Problem Relation Age of Onset  . Diabetes Mother   . Diabetes Brother   . Colon cancer Brother        Diagnosed > age 77     Social History Ms. Macaraeg reports that she has never smoked. She has never used smokeless tobacco. Ms. Kamphuis reports no history of alcohol use.   Review of Systems CONSTITUTIONAL: No weight loss, fever, chills, weakness or fatigue.  HEENT: Eyes: No visual loss, blurred vision, double vision or yellow sclerae.No hearing loss, sneezing, congestion, runny nose or sore throat.  SKIN: No rash or itching.  CARDIOVASCULAR:  RESPIRATORY: No shortness of breath, cough or sputum.  GASTROINTESTINAL: No anorexia, nausea, vomiting or diarrhea. No abdominal pain or blood.  GENITOURINARY: No burning on urination, no polyuria NEUROLOGICAL: No headache, dizziness, syncope, paralysis, ataxia, numbness or tingling in the extremities. No change in bowel or bladder control.  MUSCULOSKELETAL: No muscle, back pain, joint pain or stiffness.  LYMPHATICS: No enlarged nodes. No history of splenectomy.  PSYCHIATRIC: No history of depression or anxiety.  ENDOCRINOLOGIC: No reports of sweating, cold or heat intolerance. No polyuria or polydipsia.  Marland Kitchen   Physical Examination There were no vitals filed for this visit. There were no vitals filed for this visit.  Gen: resting comfortably, no acute distress HEENT: no scleral icterus, pupils equal round and reactive, no palptable cervical adenopathy,  CV Resp: Clear to auscultation bilaterally GI: abdomen is soft, non-tender, non-distended, normal bowel sounds, no hepatosplenomegaly MSK: extremities are warm, no edema.  Skin: warm, no rash Neuro:  no focal deficits Psych: appropriate affect   Diagnostic Studies  Jan 2020 echo Study Conclusions  - Left ventricle: The cavity size was normal. Wall thickness  was increased in a pattern of moderate LVH. Systolic function was normal. The estimated ejection fraction was in the range of 60% to 65%. Wall motion was normal; there were no regional wall motion abnormalities. Doppler parameters are consistent with abnormal left ventricular relaxation (grade 1 diastolic dysfunction). - Aortic valve: Mildly calcified annulus. Trileaflet; mildly thickened leaflets. Valve area (VTI): 2.67 cm^2. Valve area (Vmax): 2.36 cm^2. - Mitral valve: Mildly calcified annulus. Mildly thickened leaflets   Assessment and Plan  1. Postop Afib - isolated episode postop short in duration, no recurrence. She was very symptomatic when occurred in the hospital, no similar symptoms since. Only rare palpitatoins that come on with nerves or anxiety recently - continue to monitor at this time  2. HTN - elevated today but has not taken meds yet. BP from visit earlier in the month with GI was reasonable   3. Preoperative evaluation - no active cardopumlonary symptoms - tolerated major surgetry in January aside from isolated afib episode - recommend proceeding with colostomy revision as planned, no further cardiac testing warranted      Arnoldo Lenis, M.D., F.A.C.C.

## 2020-02-27 ENCOUNTER — Other Ambulatory Visit: Payer: Self-pay

## 2020-02-27 ENCOUNTER — Ambulatory Visit (INDEPENDENT_AMBULATORY_CARE_PROVIDER_SITE_OTHER): Payer: Medicare Other | Admitting: Cardiology

## 2020-02-27 ENCOUNTER — Encounter: Payer: Self-pay | Admitting: *Deleted

## 2020-02-27 ENCOUNTER — Encounter: Payer: Self-pay | Admitting: Cardiology

## 2020-02-27 VITALS — BP 150/78 | HR 46 | Ht 65.0 in | Wt 182.0 lb

## 2020-02-27 DIAGNOSIS — I9789 Other postprocedural complications and disorders of the circulatory system, not elsewhere classified: Secondary | ICD-10-CM | POA: Diagnosis not present

## 2020-02-27 DIAGNOSIS — I10 Essential (primary) hypertension: Secondary | ICD-10-CM | POA: Diagnosis not present

## 2020-02-27 DIAGNOSIS — I4891 Unspecified atrial fibrillation: Secondary | ICD-10-CM

## 2020-02-27 MED ORDER — NEBIVOLOL HCL 5 MG PO TABS: 5.0000 mg | ORAL_TABLET | Freq: Every day | ORAL | 1 refills | Status: DC

## 2020-02-27 NOTE — Patient Instructions (Signed)
Your physician wants you to follow-up in: [place] will receive a reminder letter in the mail two months in advance. If you don't receive a letter, please call our office to schedule the follow-up appointment.  Your physician has recommended you make the following change in your medication:   DECREASE BYSTOLIC 5 MG DAILY   Thank you for choosing [facility]!!

## 2020-02-27 NOTE — Addendum Note (Signed)
Addended by: Julian Hy T on: 02/27/2020 01:37 PM   Modules accepted: Orders

## 2020-02-27 NOTE — Progress Notes (Signed)
Clinical Summary [name] is a 79 y.o.female seen today for follow up of the following medical problems.    1. Afib - new diagnosis during Jan 2020 admission - occurred postop after abdominal surgery - short duration of afib <6 hrs. Was not committed to anticoag given isolated episode postop. CHADS2Vasc score is 27 (age x2, gender, TIA x 2, HTN.   - can have some heart skipping when she feels anxious, otherwise no symptoms - often gets dizzy headed, though often brought on with head movements.   2. HTN - compliant with meds  3. CKD 3 - admission 12/2019 with UTI, AKI on CKD - lasix was held  4. History of TIAs   5. Preoperative evaluation - no contraindication for back procedure from cardiac standpoint.    Past Medical History:  Diagnosis Date  . Anxiety   . Arthritis   . Bronchitis   . CKD (chronic kidney disease) stage 3, GFR 30-59 ml/min 05/04/2016  . COPD (chronic obstructive pulmonary disease) ([place])   . Depression   . Dysuria 03/27/2019  . Elevated LFTs 05/04/2016  . History of TIAs 1981   left side weakness  . HIV (human immunodeficiency virus infection) ([place])   . Hypertension   . Kidney disease related to HIV infection [facility])    pt states she has to have her creatinine level checked often   . Nausea 03/03/2017  . Peripheral neuropathy    bilateral feet  . Seasonal allergies 03/27/2019  . Sinusitis 05/06/2018  . UTI (urinary tract infection) 03/03/2017  . Weight loss 03/03/2017     Allergies  Allergen Reactions  . Bee Venom Anaphylaxis  . Promethazine Swelling  . Tenofovir Disoproxil Fumarate [Tenofovir Disoproxil Fumarate] Other (See Comments)    Renal failure, (08/27/2006)  . Compazine  [Prochlorperazine Edisylate] Nausea And Vomiting  . Haloperidol Lactate Other (See Comments)    Reaction is muscle tension, causes severe spasms in face and neck. Forces eyes to roll in the back of the head  . Brompheniramine-Acetaminophen   . Chlorcyclizine     . Chlorpromazine   . Codeine Itching and Nausea And Vomiting  . Navane [Thiothixene] Other (See Comments) and Nausea And Vomiting    Same muscle spasm reaction as haldol  . Other     NOVACAINE  . Prednisone     Brief mild psychosis and agitation  . Promethazine Hcl Other (See Comments)  . Propoxyphene N-Acetaminophen Itching and Nausea And Vomiting  . Morphine Itching and Swelling     Current Outpatient Medications  Medication Sig Dispense Refill  . abacavir-dolutegravir-lamiVUDine (TRIUMEQ) 600-50-300 MG tablet Take 1 tablet by mouth daily. 90 tablet 1  . albuterol (PROVENTIL HFA;VENTOLIN HFA) 108 (90 Base) MCG/ACT inhaler Inhale 1-2 puffs into the lungs every 6 (six) hours as needed for wheezing or shortness of breath. 1 Inhaler 0  . amitriptyline (ELAVIL) 50 MG tablet Take 1 tablet (50 mg total) by mouth at bedtime. 30 tablet 0  . Cholecalciferol (VITAMIN D3) 10000 units TABS Take 1 tablet by mouth daily.    . diphenhydrAMINE (BENADRYL) 25 mg capsule Take 25 mg by mouth daily as needed for itching (itching from codeine).     Marland Kitchen docusate sodium (COLACE) 100 MG capsule Take 1 capsule (100 mg total) by mouth 2 (two) times daily. 10 capsule 0  . fluocinonide-emollient (LIDEX-E) 0.05 % cream Apply 1 application topically 2 (two) times daily. (Patient taking differently: Apply 1 application topically 2 (two) times daily as  needed (for skin irritation). ) 30 g 0  . fluticasone (FLONASE) 50 MCG/ACT nasal spray 2 SPRAYS INTO BOTH NOSTRILS AT BEDTIME. (Patient taking differently: Place 2 sprays into both nostrils at bedtime. ) 16 g 5  . furosemide (LASIX) 40 MG tablet Take 1 tablet (40 mg total) by mouth daily as needed for fluid. Hold until follow up with PCP    . ipratropium-albuterol (DUONEB) 0.5-2.5 (3) MG/3ML SOLN Inhale 3 mLs into the lungs every 6 (six) hours as needed (shortness of breath).     . LORazepam (ATIVAN) 0.5 MG tablet Take 0.5 mg by mouth 2 (two) times daily.     . magnesium  30 MG tablet Take 30 mg by mouth at bedtime.     . Multiple Vitamin (ONE-A-DAY 55 PLUS PO) Take 1 tablet by mouth daily.    . nebivolol (BYSTOLIC) 10 MG tablet Take 10 mg by mouth daily.     . Omega-3 Fatty Acids (FISH OIL BURP-LESS) 1000 MG CAPS Take 1 capsule by mouth daily.    . ondansetron (ZOFRAN) 8 MG tablet Take 8 mg by mouth daily as needed for nausea or vomiting.     . Oxycodone HCl 10 MG TABS Take 10 mg by mouth 2 (two) times daily as needed (for pain).    . polyvinyl alcohol (LIQUIFILM TEARS) 1.4 % ophthalmic solution Place 1 drop into both eyes daily.     . solifenacin (VESICARE) 10 MG tablet Take 1 tablet (10 mg total) by mouth daily. 30 tablet 11  . thiamine (VITAMIN B-1) 100 MG tablet Take 100 mg by mouth daily.     No current facility-administered medications for this visit.     Past Surgical History:  Procedure Laterality Date  . ABDOMINAL HYSTERECTOMY  1985  . BACK SURGERY    . CATARACT EXTRACTION, BILATERAL    . CHOLECYSTECTOMY    . COLONOSCOPY  08/10/2002   NUR: Normal colonoscopy except for some hemorrhoids and some erythema at the dentate line  . COLONOSCOPY  12/10/2006   Edwards:Diffuse colitis in the transverse and descending colon/This is not entirely typical of ischemic colitis or her HIV positive/ disease.  We need to be concerned about other causes  . COLONOSCOPY  01/19/2011   RMR: Internal and external hemorrhoids, likely source of hematochezia anal papilla, otherwise normal rectal mucosa/ Left-sided diverticula.  Cecal polyp with status post cold snare polypectomy.  Remainder of colonic mucosa appeared normal. Pathology with tubular adenoma. Repeat in 2017  . COLOSTOMY N/A 11/12/2018   Procedure: COLOSTOMY;  Surgeon: Virl Cagey, MD;  Location: [facility];  Service: General;  Laterality: N/A;  . ESOPHAGOGASTRODUODENOSCOPY  08/10/2002     NUR: Mild changes of reflux esophagitis limited to gastroesophageal junction  No evidence of ring, stricture, or  esophageal candidiasis dysphagia/ Gastritis, possibly H. pylori induced  . ESOPHAGOGASTRODUODENOSCOPY  12/25/2002   NUR: Erosive antral gastritis.  The degree of gastritis is more significant than her last exam/ Normal examination of the esophagus/ Esophageal dilatation performed by passing 24 and 41 [place]  . ESOPHAGOGASTRODUODENOSCOPY  11/13/2010   RMR: Circumferential distal esophageal erosions with soft peptic stricture, consistent with erosive reflux esophagitis or stricture  formation, status post dilation as described above/  Small hiatal hernia/ Tiny antral erosions, otherwise normal stomach D1 and D2  . HEMORRHOID SURGERY  09/02/2012   Procedure: HEMORRHOIDECTOMY;  Surgeon: Jamesetta So, MD;  Location: [facility];  Service: General;  Laterality: N/A;  . LAPAROTOMY N/A 11/12/2018  Procedure: EXPLORATORY LAPAROTOMY;  Surgeon: Virl Cagey, MD;  Location: [facility];  Service: General;  Laterality: N/A;  . PARTIAL COLECTOMY N/A 11/12/2018   Procedure: PARTIAL COLECTOMY;  Surgeon: Virl Cagey, MD;  Location: [facility];  Service: General;  Laterality: N/A;  . TUBAL LIGATION       Allergies  Allergen Reactions  . Bee Venom Anaphylaxis  . Promethazine Swelling  . Tenofovir Disoproxil Fumarate [Tenofovir Disoproxil Fumarate] Other (See Comments)    Renal failure, (08/27/2006)  . Compazine  [Prochlorperazine Edisylate] Nausea And Vomiting  . Haloperidol Lactate Other (See Comments)    Reaction is muscle tension, causes severe spasms in face and neck. Forces eyes to roll in the back of the head  . Brompheniramine-Acetaminophen   . Chlorcyclizine   . Chlorpromazine   . Codeine Itching and Nausea And Vomiting  . Navane [Thiothixene] Other (See Comments) and Nausea And Vomiting    Same muscle spasm reaction as haldol  . Other     NOVACAINE  . Prednisone     Brief mild psychosis and agitation  . Promethazine Hcl Other (See Comments)  . Propoxyphene N-Acetaminophen Itching and  Nausea And Vomiting  . Morphine Itching and Swelling      Family History  Problem Relation Age of Onset  . Diabetes Mother   . Diabetes Brother   . Colon cancer Brother        Diagnosed > age 33     Social History Ms. Lizak reports that she has never smoked. She has never used smokeless tobacco. Ms. Spiewak reports no history of alcohol use.   Review of Systems CONSTITUTIONAL: No weight loss, fever, chills, weakness or fatigue.  HEENT: Eyes: No visual loss, blurred vision, double vision or yellow sclerae.No hearing loss, sneezing, congestion, runny nose or sore throat.  SKIN: No rash or itching.  CARDIOVASCULAR: per hpi RESPIRATORY: No shortness of breath, cough or sputum.  GASTROINTESTINAL: No anorexia, nausea, vomiting or diarrhea. No abdominal pain or blood.  GENITOURINARY: No burning on urination, no polyuria NEUROLOGICAL: No headache, dizziness, syncope, paralysis, ataxia, numbness or tingling in the extremities. No change in bowel or bladder control.  MUSCULOSKELETAL: No muscle, back pain, joint pain or stiffness.  LYMPHATICS: No enlarged nodes. No history of splenectomy.  PSYCHIATRIC: No history of depression or anxiety.  ENDOCRINOLOGIC: No reports of sweating, cold or heat intolerance. No polyuria or polydipsia.  Marland Kitchen   Physical Examination Today's Vitals   02/27/20 1257  BP: (!) 150/78  Pulse: (!) 46  SpO2: 98%  Weight: 182 lb (82.6 kg)  Height: 5\' 5"  (1.651 m)   Body mass index is 30.29 kg/m.  Gen: resting comfortably, no acute distress HEENT: no scleral icterus, pupils equal round and reactive, no palptable cervical adenopathy,  CV: RRR, no m/r/g no jvd Resp: Clear to auscultation bilaterally GI: abdomen is soft, non-tender, non-distended, normal bowel sounds, no hepatosplenomegaly MSK: extremities are warm, no edema.  Skin: warm, no rash Neuro:  no focal deficits Psych: appropriate affect   Diagnostic Studies Jan 2020 echo Study  Conclusions  - Left ventricle: The cavity size was normal. Wall thickness was increased in a pattern of moderate LVH. Systolic function was normal. The estimated ejection fraction was in the range of 60% to 65%. Wall motion was normal; there were no regional wall motion abnormalities. Doppler parameters are consistent with abnormal left ventricular relaxation (grade 1 diastolic dysfunction). - Aortic valve: Mildly calcified annulus. Trileaflet; mildly thickened leaflets. Valve area (VTI): 2.67  cm^2. Valve area (Vmax): 2.36 cm^2. - Mitral valve: Mildly calcified annulus. Mildly thickened leaflets    Assessment and Plan   1. Postop Afib - isolated episode postop short in duration, no recurrence. She was very symptomatic when occurred in the hospital, no similar symptoms since.  - ekg today shows sinus brady high 40s. Lower bystolic to 5 mg daily - continue to monitor for recurrence, has not been committed to anticoag  2. HTN -above goal - lowering bystolic to 5mg  daily due to low HRs - start norvasc 5mg  daily.    3. Preoperative evaluation - no active cardopumlonary symptoms -- no contraindication for back procedure from cardiac standpoint.    F/u 6 months. Request pcp labs   Arnoldo Lenis, M.D.

## 2020-03-13 NOTE — Progress Notes (Deleted)
Referring Provider: No ref. provider found Primary Care Physician:  System, Pcp Not In Primary GI Physician: Dr. Marland Kitchen  No chief complaint on file.   HPI:   [name] is a 79 y.o. female presenting today to discuss scheduling colonoscopy.  Past medical history significant for COPD, CKD, HIV, HTN, anxiety, depression, and colon polyps.  More recently with partial colectomy with segmental resection of the proximal descending colon and end colostomy for stair coral ulcer in January 2020.  Last colonoscopy in 2012 with 1 tubular adenoma in the cecum, surveillance was due in 2017.  She was last seen in our office in September 2020 at the request of Dr. Constance Haw for colonoscopy prior to colostomy reversal.   At the time of her last visit, she was doing well without any significant upper or lower GI symptoms.  She did report intermittent lower abdominal pain that have been present for over a year and seem to be associated with frequent UTIs.  She did endorse dysuria.  Noted patient had developed A. fib with RVR after partial colectomy.  She had follow-up in March with cardiology and patient was in sinus rhythm and did not feel any further evaluation was needed.  However, at her office visit, she reported 3 episodes of palpitations with associated symptoms of presyncope since hospitalization with last episode on 06/30/2019.  Due to this, I recommend she follow-up with cardiology prior to scheduling her colonoscopy.  I also checked her urine as she reported dysuria which ultimately revealed Eacherichia coli.  She was treated with Cipro 500 mg daily x3 days and advised to follow-up with PCP.  Patient had follow-up with cardiology 07/24/2019 and was okay to proceed with colonoscopy.  We tried reaching out to patient schedule colonoscopy but could not get in touch with her.  Letter was mailed.  Follow-up with cardiology again on 02/27/2020 with plans to continue to monitor for any recurrence of A.  Fib.  Today:  Past Medical History:  Diagnosis Date  . Anxiety   . Arthritis   . Bronchitis   . CKD (chronic kidney disease) stage 3, GFR 30-59 ml/min 05/04/2016  . COPD (chronic obstructive pulmonary disease) ([place])   . Depression   . Dysuria 03/27/2019  . Elevated LFTs 05/04/2016  . History of TIAs 1981   left side weakness  . HIV (human immunodeficiency virus infection) ([place])   . Hypertension   . Kidney disease related to HIV infection [facility])    pt states she has to have her creatinine level checked often   . Nausea 03/03/2017  . Peripheral neuropathy    bilateral feet  . Seasonal allergies 03/27/2019  . Sinusitis 05/06/2018  . UTI (urinary tract infection) 03/03/2017  . Weight loss 03/03/2017    Past Surgical History:  Procedure Laterality Date  . ABDOMINAL HYSTERECTOMY  1985  . BACK SURGERY    . CATARACT EXTRACTION, BILATERAL    . CHOLECYSTECTOMY    . COLONOSCOPY  08/10/2002   NUR: Normal colonoscopy except for some hemorrhoids and some erythema at the dentate line  . COLONOSCOPY  12/10/2006   Edwards:Diffuse colitis in the transverse and descending colon/This is not entirely typical of ischemic colitis or her HIV positive/ disease.  We need to be concerned about other causes  . COLONOSCOPY  01/19/2011   RMR: Internal and external hemorrhoids, likely source of hematochezia anal papilla, otherwise normal rectal mucosa/ Left-sided diverticula.  Cecal polyp with status post cold snare polypectomy.  Remainder of  colonic mucosa appeared normal. Pathology with tubular adenoma. Repeat in 2017  . COLOSTOMY N/A 11/12/2018   Procedure: COLOSTOMY;  Surgeon: Virl Cagey, MD;  Location: [facility];  Service: General;  Laterality: N/A;  . ESOPHAGOGASTRODUODENOSCOPY  08/10/2002     NUR: Mild changes of reflux esophagitis limited to gastroesophageal junction  No evidence of ring, stricture, or esophageal candidiasis dysphagia/ Gastritis, possibly H. pylori induced  . ESOPHAGOGASTRODUODENOSCOPY   12/25/2002   NUR: Erosive antral gastritis.  The degree of gastritis is more significant than her last exam/ Normal examination of the esophagus/ Esophageal dilatation performed by passing 68 and 2 [place]  . ESOPHAGOGASTRODUODENOSCOPY  11/13/2010   RMR: Circumferential distal esophageal erosions with soft peptic stricture, consistent with erosive reflux esophagitis or stricture  formation, status post dilation as described above/  Small hiatal hernia/ Tiny antral erosions, otherwise normal stomach D1 and D2  . HEMORRHOID SURGERY  09/02/2012   Procedure: HEMORRHOIDECTOMY;  Surgeon: Jamesetta So, MD;  Location: [facility];  Service: General;  Laterality: N/A;  . LAPAROTOMY N/A 11/12/2018   Procedure: EXPLORATORY LAPAROTOMY;  Surgeon: Virl Cagey, MD;  Location: [facility];  Service: General;  Laterality: N/A;  . PARTIAL COLECTOMY N/A 11/12/2018   Procedure: PARTIAL COLECTOMY;  Surgeon: Virl Cagey, MD;  Location: [facility];  Service: General;  Laterality: N/A;  . TUBAL LIGATION      Current Outpatient Medications  Medication Sig Dispense Refill  . abacavir-dolutegravir-lamiVUDine (TRIUMEQ) 600-50-300 MG tablet Take 1 tablet by mouth daily. 90 tablet 1  . albuterol (PROVENTIL HFA;VENTOLIN HFA) 108 (90 Base) MCG/ACT inhaler Inhale 1-2 puffs into the lungs every 6 (six) hours as needed for wheezing or shortness of breath. 1 Inhaler 0  . amitriptyline (ELAVIL) 50 MG tablet Take 1 tablet (50 mg total) by mouth at bedtime. 30 tablet 0  . Cholecalciferol (VITAMIN D3) 10000 units TABS Take 1 tablet by mouth daily.    . diphenhydrAMINE (BENADRYL) 25 mg capsule Take 25 mg by mouth daily as needed for itching (itching from codeine).     Marland Kitchen docusate sodium (COLACE) 100 MG capsule Take 1 capsule (100 mg total) by mouth 2 (two) times daily. 10 capsule 0  . fluocinonide-emollient (LIDEX-E) 0.05 % cream Apply 1 application topically 2 (two) times daily. (Patient taking differently: Apply 1 application  topically 2 (two) times daily as needed (for skin irritation). ) 30 g 0  . fluticasone (FLONASE) 50 MCG/ACT nasal spray 2 SPRAYS INTO BOTH NOSTRILS AT BEDTIME. (Patient taking differently: Place 2 sprays into both nostrils at bedtime. ) 16 g 5  . furosemide (LASIX) 40 MG tablet Take 1 tablet (40 mg total) by mouth daily as needed for fluid. Hold until follow up with PCP    . ipratropium-albuterol (DUONEB) 0.5-2.5 (3) MG/3ML SOLN Inhale 3 mLs into the lungs every 6 (six) hours as needed (shortness of breath).     . LORazepam (ATIVAN) 0.5 MG tablet Take 0.5 mg by mouth 2 (two) times daily.     . magnesium 30 MG tablet Take 30 mg by mouth at bedtime.     . Multiple Vitamin (ONE-A-DAY 55 PLUS PO) Take 1 tablet by mouth daily.    . nebivolol (BYSTOLIC) 5 MG tablet Take 1 tablet (5 mg total) by mouth daily. 90 tablet 1  . Omega-3 Fatty Acids (FISH OIL BURP-LESS) 1000 MG CAPS Take 1 capsule by mouth daily.    . ondansetron (ZOFRAN) 8 MG tablet Take 8 mg by mouth  daily as needed for nausea or vomiting.     . Oxycodone HCl 10 MG TABS Take 10 mg by mouth 2 (two) times daily as needed (for pain).    . polyvinyl alcohol (LIQUIFILM TEARS) 1.4 % ophthalmic solution Place 1 drop into both eyes daily.     . solifenacin (VESICARE) 10 MG tablet Take 1 tablet (10 mg total) by mouth daily. 30 tablet 11  . thiamine (VITAMIN B-1) 100 MG tablet Take 100 mg by mouth daily.     No current facility-administered medications for this visit.    Allergies as of 03/14/2020 - Review Complete 02/27/2020  Allergen Reaction Noted  . Bee venom Anaphylaxis 08/27/2012  . Promethazine Swelling 06/18/2014  . Tenofovir disoproxil fumarate [tenofovir disoproxil fumarate] Other (See Comments) 03/05/2011  . Compazine  [prochlorperazine edisylate] Nausea And Vomiting 06/18/2014  . Haloperidol lactate Other (See Comments)   . Brompheniramine-acetaminophen  06/10/2017  . Chlorcyclizine  06/10/2017  . Chlorpromazine  06/10/2017  .  Codeine Itching and Nausea And Vomiting   . Navane [thiothixene] Other (See Comments) and Nausea And Vomiting   . Prednisone  04/01/2017  . Promethazine hcl Other (See Comments)   . Propoxyphene n-acetaminophen Itching and Nausea And Vomiting   . Morphine Itching and Swelling 08/06/2014    Family History  Problem Relation Age of Onset  . Diabetes Mother   . Diabetes Brother   . Colon cancer Brother        Diagnosed > age 86    Social History   Socioeconomic History  . Marital status: Widowed    Spouse name: Not on file  . Number of children: 3  . Years of education: Not on file  . Highest education level: Not on file  Occupational History  . Occupation: retired    Fish farm manager: RETIRED  Tobacco Use  . Smoking status: Never Smoker  . Smokeless tobacco: Never Used  Substance and Sexual Activity  . Alcohol use: No  . Drug use: No  . Sexual activity: Not Currently    Comment: declined condoms  Other Topics Concern  . Not on file  Social History Narrative  . Not on file   Social Determinants of Health   Financial Resource Strain:   . Difficulty of Paying Living Expenses:   Food Insecurity:   . Worried About Charity fundraiser in the Last Year:   . Arboriculturist in the Last Year:   Transportation Needs:   . Film/video editor (Medical):   Marland Kitchen Lack of Transportation (Non-Medical):   Physical Activity:   . Days of Exercise per Week:   . Minutes of Exercise per Session:   Stress:   . Feeling of Stress :   Social Connections:   . Frequency of Communication with Friends and Family:   . Frequency of Social Gatherings with Friends and Family:   . Attends Religious Services:   . Active Member of Clubs or Organizations:   . Attends Archivist Meetings:   Marland Kitchen Marital Status:     Review of Systems: Gen: Denies fever, chills, anorexia. Denies fatigue, weakness, weight loss.  CV: Denies chest pain, palpitations, syncope, peripheral edema, and  claudication. Resp: Denies dyspnea at rest, cough, wheezing, coughing up blood, and pleurisy. GI: Denies vomiting blood, jaundice, and fecal incontinence.   Denies dysphagia or odynophagia. Derm: Denies rash, itching, dry skin Psych: Denies depression, anxiety, memory loss, confusion. No homicidal or suicidal ideation.  Heme: Denies bruising, bleeding, and enlarged  lymph nodes.  Physical Exam: There were no vitals taken for this visit. General:   Alert and oriented. No distress noted. Pleasant and cooperative.  Head:  Normocephalic and atraumatic. Eyes:  Conjuctiva clear without scleral icterus. Mouth:  Oral mucosa pink and moist. Good dentition. No lesions. Heart:  S1, S2 present without murmurs appreciated. Lungs:  Clear to auscultation bilaterally. No wheezes, rales, or rhonchi. No distress.  Abdomen:  +BS, soft, non-tender and non-distended. No rebound or guarding. No HSM or masses noted. Msk:  Symmetrical without gross deformities. Normal posture. Extremities:  Without edema. Neurologic:  Alert and  oriented x4 Psych:  Alert and cooperative. Normal mood and affect.

## 2020-03-14 ENCOUNTER — Ambulatory Visit: Payer: Medicare Other | Admitting: Gastroenterology

## 2020-03-14 ENCOUNTER — Other Ambulatory Visit: Payer: Self-pay

## 2020-03-19 ENCOUNTER — Ambulatory Visit: Payer: Medicare HMO | Admitting: Cardiology

## 2020-04-01 ENCOUNTER — Ambulatory Visit: Payer: Medicare Other | Admitting: Cardiology

## 2020-04-10 ENCOUNTER — Telehealth: Payer: Self-pay | Admitting: *Deleted

## 2020-04-10 NOTE — Telephone Encounter (Signed)
Left message asking patient to please call and schedule labs, follow up for her annual appointment. Landis Gandy, RN

## 2020-04-11 ENCOUNTER — Other Ambulatory Visit: Payer: Self-pay | Admitting: Infectious Disease

## 2020-04-11 DIAGNOSIS — B20 Human immunodeficiency virus [HIV] disease: Secondary | ICD-10-CM

## 2020-04-13 ENCOUNTER — Other Ambulatory Visit: Payer: Self-pay | Admitting: Infectious Disease

## 2020-04-13 DIAGNOSIS — B20 Human immunodeficiency virus [HIV] disease: Secondary | ICD-10-CM

## 2020-04-15 ENCOUNTER — Other Ambulatory Visit: Payer: Self-pay

## 2020-04-15 ENCOUNTER — Telehealth: Payer: Self-pay | Admitting: *Deleted

## 2020-04-15 DIAGNOSIS — B20 Human immunodeficiency virus [HIV] disease: Secondary | ICD-10-CM

## 2020-04-15 MED ORDER — TRIUMEQ 600-50-300 MG PO TABS: 1.0000 | ORAL_TABLET | Freq: Every day | ORAL | 0 refills | Status: DC

## 2020-04-15 NOTE — Telephone Encounter (Signed)
Received refill request for patient's triumeq from [facility].  Paitent is overdue for visit, RN left message asking her to please call to schedule.  6 month refill sent in March to [facility]. [name] will transfer the prescription from [facility], will pass along message to have her call for an appointment. Landis Gandy, RN

## 2020-04-21 NOTE — Progress Notes (Signed)
Referring Provider: Dr. Constance Haw Primary Care Physician:  System, Pcp Not In  Primary GI: Dr. Gala Romney  Patient Location: Home   Provider Location: [facility] office   Reason for Visit: Discuss scheduling colonoscopy   Persons present on the virtual encounter, with roles:  Aliene Altes, PA-C (provider), [name] (patient)   Total time (minutes) spent on medical discussion: 23 minutes  Virtual Visit via Telephone Note Due to COVID-19, visit is conducted virtually and was requested by patient.   I connected with [name] on 04/22/20 at  3:30 PM EDT by telephone and verified that I am speaking with the correct person using two identifiers.   I discussed the limitations, risks, security and privacy concerns of performing an evaluation and management service by telephone and the availability of in person appointments. I also discussed with the patient that there may be a patient responsible charge related to this service. The patient expressed understanding and agreed to proceed.  Chief Complaint  Patient presents with  . Colonoscopy    wants to discuss TCS  . abdominal swelling    side of colostomy     History of Present Illness: 79 year old female with past medical history of COPD, CKD, HIV, HTN, anxiety, depression, and colon polyps who is presenting to discuss scheduling a colonosocpy prior to end colostomy reversal. Last colonoscopy in 2012 with 1 cecal polyp.  Pathology identified tubular adenoma.  She was due for surveillance in 2017. Patient was last seen in September 2020 for the same at the request of Dr. Constance Haw. Patient had partial colectomy with segmental resection of the proximal descending colon and end colostomy for sterocoral ulcer with perforation in January 2020. After surgery, patient developed RVR. She received IV Cardizem and converted back to normal sinus rhythm within 5 hours.  She saw cardiology for follow-up in March who reported patient was in normal sinus  rhythm and without any recurrence of symptoms. Cardiology recommended telemetry at the time of colonoscopy. Patient reported 3 episodes of palpitations with associated symptom of presyncope since hospitalization. Due to recurrence of symptoms, patient was advised to follow-up with cardiology prior to scheduling TCS.   Patient had follow-up with cardiology 07/24/19. Ok to proceed with TCS. We were unable to contact patient to schedule.    Today:  Reports she has swelling the size of a grapefruit under the red stoma. This has been present for "quite a while." She thought it was normal. Hasn't changed since it came up. No regular pain at the ostomy site. Buldge reduces when she presses on it but returns when she removes pressure. Stools are soft and daily. Not needing anything to help with this. States she has been trying to eat better. Increased vegetable and fruit intake. Has lost 25 lbs in the last 6 months. No blood in the stool. No black stool.   Chronically with nausea a couple times a week since operation. No triggers. Zofran works well for this. Rare vomiting. Only 3 episodes of emesis in the last 1.5 years. No GERD symptoms. Dysphagia to chicken/meats. No trouble with vegetables/soft foods/liquids. Returned a couple years ago. Occurring a couple times a month.  Requires food regurgitation.  Would like to have esophagus stretched. Not taking anything for GERD in at least the last 6-7 months. Wants to hold off on PPI.    Past Medical History:  Diagnosis Date  . Anxiety   . Arthritis   . Bronchitis   . CKD (chronic kidney disease) stage 3, GFR  30-59 ml/min 05/04/2016  . COPD (chronic obstructive pulmonary disease) ([place])   . Depression   . Dysuria 03/27/2019  . Elevated LFTs 05/04/2016  . History of TIAs 1981   left side weakness  . HIV (human immunodeficiency virus infection) ([place])   . Hypertension   . Kidney disease related to HIV infection [facility])    pt states she has to have her creatinine  level checked often   . Nausea 03/03/2017  . Peripheral neuropathy    bilateral feet  . Seasonal allergies 03/27/2019  . Sinusitis 05/06/2018  . UTI (urinary tract infection) 03/03/2017  . Weight loss 03/03/2017     Past Surgical History:  Procedure Laterality Date  . ABDOMINAL HYSTERECTOMY  1985  . BACK SURGERY    . CATARACT EXTRACTION, BILATERAL    . CHOLECYSTECTOMY    . COLONOSCOPY  08/10/2002   NUR: Normal colonoscopy except for some hemorrhoids and some erythema at the dentate line  . COLONOSCOPY  12/10/2006   [place]:Diffuse colitis in the transverse and descending colon/This is not entirely typical of ischemic colitis or her HIV positive/ disease.  We need to be concerned about other causes  . COLONOSCOPY  01/19/2011   RMR: Internal and external hemorrhoids, likely source of hematochezia anal papilla, otherwise normal rectal mucosa/ Left-sided diverticula.  Cecal polyp with status post cold snare polypectomy.  Remainder of colonic mucosa appeared normal. Pathology with tubular adenoma. Repeat in 2017  . COLOSTOMY N/A 11/12/2018   Procedure: COLOSTOMY;  Surgeon: Virl Cagey, MD;  Location: [facility];  Service: General;  Laterality: N/A;  . ESOPHAGOGASTRODUODENOSCOPY  08/10/2002     NUR: Mild changes of reflux esophagitis limited to gastroesophageal junction  No evidence of ring, stricture, or esophageal candidiasis dysphagia/ Gastritis, possibly H. pylori induced  . ESOPHAGOGASTRODUODENOSCOPY  12/25/2002   NUR: Erosive antral gastritis.  The degree of gastritis is more significant than her last exam/ Normal examination of the esophagus/ Esophageal dilatation performed by passing 60 and 35 [place]  . ESOPHAGOGASTRODUODENOSCOPY  11/13/2010   RMR: Circumferential distal esophageal erosions with soft peptic stricture, consistent with erosive reflux esophagitis or stricture  formation, status post dilation as described above/  Small hiatal hernia/ Tiny antral erosions, otherwise  normal stomach D1 and D2  . HEMORRHOID SURGERY  09/02/2012   Procedure: HEMORRHOIDECTOMY;  Surgeon: Jamesetta So, MD;  Location: [facility];  Service: General;  Laterality: N/A;  . LAPAROTOMY N/A 11/12/2018   Procedure: EXPLORATORY LAPAROTOMY;  Surgeon: Virl Cagey, MD;  Location: [facility];  Service: General;  Laterality: N/A;  . PARTIAL COLECTOMY N/A 11/12/2018   Procedure: PARTIAL COLECTOMY;  Surgeon: Virl Cagey, MD;  Location: [facility];  Service: General;  Laterality: N/A;  . TUBAL LIGATION       Current Meds  Medication Sig  . abacavir-dolutegravir-lamiVUDine (TRIUMEQ) 600-50-300 MG tablet Take 1 tablet by mouth daily.  Marland Kitchen albuterol (PROVENTIL HFA;VENTOLIN HFA) 108 (90 Base) MCG/ACT inhaler Inhale 1-2 puffs into the lungs every 6 (six) hours as needed for wheezing or shortness of breath.  . Cholecalciferol (VITAMIN D3) 10000 units TABS Take 1 tablet by mouth daily.  . diphenhydrAMINE (BENADRYL) 25 mg capsule Take 25 mg by mouth daily as needed for itching (itching from codeine).   . fluocinonide-emollient (LIDEX-E) 0.05 % cream Apply 1 application topically 2 (two) times daily. (Patient taking differently: Apply 1 application topically 2 (two) times daily as needed (for skin irritation). )  . fluticasone (FLONASE) 50 MCG/ACT nasal spray  2 SPRAYS INTO BOTH NOSTRILS AT BEDTIME. (Patient taking differently: Place 2 sprays into both nostrils at bedtime. )  . furosemide (LASIX) 40 MG tablet Take 1 tablet (40 mg total) by mouth daily as needed for fluid. Hold until follow up with PCP  . ipratropium-albuterol (DUONEB) 0.5-2.5 (3) MG/3ML SOLN Inhale 3 mLs into the lungs every 6 (six) hours as needed (shortness of breath).   . LORazepam (ATIVAN) 0.5 MG tablet Take 0.5 mg by mouth 2 (two) times daily.   . magnesium 30 MG tablet Take 30 mg by mouth at bedtime.   . Multiple Vitamin (ONE-A-DAY 55 PLUS PO) Take 1 tablet by mouth daily.  . nebivolol (BYSTOLIC) 5 MG tablet Take 1 tablet (5 mg total)  by mouth daily.  . Omega-3 Fatty Acids (FISH OIL BURP-LESS) 1000 MG CAPS Take 1 capsule by mouth daily.  . ondansetron (ZOFRAN) 8 MG tablet Take 8 mg by mouth daily as needed for nausea or vomiting.   . Oxycodone HCl 10 MG TABS Take 10 mg by mouth 2 (two) times daily as needed (for pain).  . polyvinyl alcohol (LIQUIFILM TEARS) 1.4 % ophthalmic solution Place 1 drop into both eyes daily.   . solifenacin (VESICARE) 10 MG tablet Take 1 tablet (10 mg total) by mouth daily.  Marland Kitchen thiamine (VITAMIN B-1) 100 MG tablet Take 100 mg by mouth daily.     Family History  Problem Relation Age of Onset  . Diabetes Mother   . Diabetes Brother   . Colon cancer Brother        Diagnosed > age 56    Social History   Socioeconomic History  . Marital status: Widowed    Spouse name: Not on file  . Number of children: 3  . Years of education: Not on file  . Highest education level: Not on file  Occupational History  . Occupation: retired    Fish farm manager: RETIRED  Tobacco Use  . Smoking status: Never Smoker  . Smokeless tobacco: Never Used  Vaping Use  . Vaping Use: Never used  Substance and Sexual Activity  . Alcohol use: No  . Drug use: No  . Sexual activity: Not Currently    Comment: declined condoms  Other Topics Concern  . Not on file  Social History Narrative  . Not on file   Social Determinants of Health   Financial Resource Strain:   . Difficulty of Paying Living Expenses:   Food Insecurity:   . Worried About Charity fundraiser in the Last Year:   . Arboriculturist in the Last Year:   Transportation Needs:   . Film/video editor (Medical):   Marland Kitchen Lack of Transportation (Non-Medical):   Physical Activity:   . Days of Exercise per Week:   . Minutes of Exercise per Session:   Stress:   . Feeling of Stress :   Social Connections:   . Frequency of Communication with Friends and Family:   . Frequency of Social Gatherings with Friends and Family:   . Attends Religious Services:   .  Active Member of Clubs or Organizations:   . Attends Archivist Meetings:   Marland Kitchen Marital Status:        Review of Systems: Gen: Denies fever, chills. Chronic sinusitis.  Denies lightheadedness, dizziness, presyncope, syncope. CV: Denies chest pain. Occasional palpitations if nervous. Occasional lightheadedness/dizziness.  Resp: Some SOB with exertion. Cough related to sinus trouble.  GI: see HPI Derm: Denies rash Heme: Bruises  easily.   Observations/Objective: No distress. Pleasant. Well nourished. Unable to perform complete physical exam due to telephone encounter. No video available.   Assessment and Plan: 79 year old female with history of COPD, CKD, HIV, HTN, anxiety, depression, and adenomatous colon polyps presenting today to discuss scheduling colonoscopy prior to end colostomy reversal.  Patient had partial colectomy with segmental resection of the proximal descending colon and end colostomy for stercoral ulcer with perforation in January 2020.  Notably, patient is also overdue for surveillance colonoscopy with last colonoscopy in 2012 with 1 tubular adenoma in the cecum with recommendations to repeat in 2017.  Today she is also reporting a abdominal bulge underneath the stoma, chronic nausea with rare vomiting since surgery, and dysphagia.  History of partial colectomy: Patient needs colonoscopy via ostomy and flex sig of remaining pouch prior to end colostomy reversal per Dr. Constance Haw.  No significant lower GI symptoms at this time.  However, patient does report a bulge at the stoma site about grapefruit size that reduces with pressure but returns, present for several months without change.  No associated pain.  Unfortunately, due to video visit, unable to visualize this. Sounds like patient may have a hernia at the site of the stoma.   I will plan to touch base with Dr. Constance Haw to see about getting patient back into see her versus ordering additional imaging prior to  colonoscopy.  Further recommendations to follow.  Of note, patient did develop RVR during surgery in January but has been cleared by cardiology.   History of adenomatous colon polyps: History of adenomatous cecal polyp on colonoscopy in 2012.  She was due for surveillance colonoscopy in 2017.  No significant lower GI symptoms at this time. No alarm symptoms.   History of partial colectomy with end colostomy for stercoral ulcer with perforation in January 2020 as discussed above.  Family history significant for brother with colon cancer diagnosed over age 43.    Patient needs colonoscopy via ostomy as well as flex sig of remaining pouch.  Plans to touch base with Dr. Constance Haw prior to pursuing this due to reports of abdominal wall bulge at stoma site.  Further recommendations to follow.  Nausea: Chronic intermittent nausea since partial colectomy in January 2020.  This responds well to Zofran.  Rare vomiting with a total of 3 episodes in the last 1.5 years.  No identified triggers.  Intentional weight loss of about 25 pounds over the last 6 months. History of cholecystectomy.  Denies GERD symptoms.  Admits to solid food dysphagia.  Bowels moving well. No abdominal pain. Etiology of chronic intermittent nausea not clear. No obstructive symptoms. Patient will need EGD +/- dilation for dysphagia which will also help evaluate her nausea and rule out pyloric stenosis, gastric outlet obstruction, and  malignancy. Other differentials include silent GERD and delayed gastric emptying. Discussed resuming Protonix but patient prefers to hold off due to not having GERD symptoms at this time.  Recommend she continue Zofran as needed for now. We will work on getting EGD +/- dilation with propofol arranged in the near future with Dr. Gala Romney.  Holding off on scheduling as patient prefers to have TCS/EGD completed at the same time.  I am planning to discuss possible parastomal hernia with Dr. Constance Haw prior to proceeding with  colonoscopy.  Further recommendations to follow.  Dysphagia: Patient reports return of intermittent solid food dysphagia about 1-2 years ago occurring a couple times a month requiring food regurgitation.  History of esophageal erosions with  peptic stricture s/p dilation in 2012.  Patient denies GERD symptoms.  She has not taken Protonix in at least 6 months.  Query whether patient may have silent reflux with return of peptic stricture.  Discussed resuming Protonix, but patient prefers to hold off on this as she does not have any GERD symptoms at this time.  She is agreeable to proceeding with EGD +/-dilation in the near future with Dr. Gala Romney.  We will hold on scheduling EGD as patient prefers TCS/EGD to be completed at the same time, and I am planning to discuss possible parastomal hernia with Dr. Constance Haw prior to proceeding with colonoscopy. Further recommendations to follow.   Advised if something were to get hung in her esophagus and not come up or go down, she should proceed to the emergency room.  Follow Up Instructions: Follow-up date to be determined.  Further recommendations regarding scheduling of TCS/EGD after discussion with Dr. Constance Haw regarding possible parastomal hernia.   I discussed the assessment and treatment plan with the patient. The patient was provided an opportunity to ask questions and all were answered. The patient agreed with the plan and demonstrated an understanding of the instructions.   The patient was advised to call back or seek an in-person evaluation if the symptoms worsen or if the condition fails to improve as anticipated.  I provided 23 minutes of non-face-to-face time during this encounter.  Aliene Altes, PA-C [facility]

## 2020-04-22 ENCOUNTER — Ambulatory Visit (INDEPENDENT_AMBULATORY_CARE_PROVIDER_SITE_OTHER): Payer: Medicare Other | Admitting: Gastroenterology

## 2020-04-22 ENCOUNTER — Encounter: Payer: Self-pay | Admitting: Gastroenterology

## 2020-04-22 ENCOUNTER — Other Ambulatory Visit: Payer: Self-pay

## 2020-04-22 ENCOUNTER — Telehealth: Payer: Self-pay | Admitting: *Deleted

## 2020-04-22 ENCOUNTER — Telehealth: Payer: Self-pay | Admitting: Gastroenterology

## 2020-04-22 DIAGNOSIS — R1319 Other dysphagia: Secondary | ICD-10-CM

## 2020-04-22 DIAGNOSIS — Z86010 Personal history of colonic polyps: Secondary | ICD-10-CM | POA: Diagnosis not present

## 2020-04-22 DIAGNOSIS — Z9049 Acquired absence of other specified parts of digestive tract: Secondary | ICD-10-CM

## 2020-04-22 DIAGNOSIS — R110 Nausea: Secondary | ICD-10-CM

## 2020-04-22 DIAGNOSIS — R1900 Intra-abdominal and pelvic swelling, mass and lump, unspecified site: Secondary | ICD-10-CM

## 2020-04-22 NOTE — Telephone Encounter (Signed)
Pt consented to a telephone visit. °

## 2020-04-22 NOTE — Telephone Encounter (Signed)
Please let patient know I have discussed the bulge at her stoma site with Dr. Constance Haw. Dr. Constance Haw states we still need TCS but would recommend CT for further evaluation.   We will need to update creatinine to determine whether or not contrast can be used. Please arrange creatinine with GFR. Once I have this, we can arrange the imaging needed.

## 2020-04-22 NOTE — Telephone Encounter (Signed)
[name], you are scheduled for a virtual visit with your provider today.  Just as we do with appointments in the office, we must obtain your consent to participate.  Your consent will be active for this visit and any virtual visit you may have with one of our providers in the next 365 days.  If you have a MyChart account, I can also send a copy of this consent to you electronically.  All virtual visits are billed to your insurance company just like a traditional visit in the office.  As this is a virtual visit, video technology does not allow for your provider to perform a traditional examination.  This may limit your provider's ability to fully assess your condition.  If your provider identifies any concerns that need to be evaluated in person or the need to arrange testing such as labs, EKG, etc, we will make arrangements to do so.  Although advances in technology are sophisticated, we cannot ensure that it will always work on either your end or our end.  If the connection with a video visit is poor, we may have to switch to a telephone visit.  With either a video or telephone visit, we are not always able to ensure that we have a secure connection.   I need to obtain your verbal consent now.   Are you willing to proceed with your visit today?

## 2020-04-22 NOTE — Patient Instructions (Signed)
I will be in contact with Dr. Constance Haw regarding the Bulge at the stoma you have reported. This may be a hernia. I will reach out to you in a couple of days with recommendations.   We will hopefully get you scheduled for a colonoscopy and upper endoscopy in the near future.   Continue using Zofran as needed for intermittent nausea.   If something were to get hung in your esophagus and not come up or go down, you should proceed to the emergency room.   Congratulation on your weight loss! Keep up the good work!  Aliene Altes, PA-C [facility]

## 2020-04-23 ENCOUNTER — Other Ambulatory Visit: Payer: Self-pay

## 2020-04-23 DIAGNOSIS — Z9049 Acquired absence of other specified parts of digestive tract: Secondary | ICD-10-CM

## 2020-04-23 DIAGNOSIS — Z79899 Other long term (current) drug therapy: Secondary | ICD-10-CM

## 2020-04-23 NOTE — Telephone Encounter (Signed)
Spoke with pt. Pt notified of [place] and Dr. Constance Haw recommendations. Lab orders were placed. Pt will have labs completed at [facility] this week. Orders faxed to [facility].

## 2020-04-24 ENCOUNTER — Other Ambulatory Visit (HOSPITAL_COMMUNITY)
Admission: RE | Admit: 2020-04-24 | Discharge: 2020-04-24 | Disposition: A | Discharge status: Home / Self Care | Payer: Medicare Other | Source: Ambulatory Visit | Attending: Gastroenterology | Admitting: Gastroenterology

## 2020-04-24 DIAGNOSIS — Z9049 Acquired absence of other specified parts of digestive tract: Secondary | ICD-10-CM | POA: Diagnosis present

## 2020-04-24 DIAGNOSIS — Z79899 Other long term (current) drug therapy: Secondary | ICD-10-CM | POA: Insufficient documentation

## 2020-04-24 LAB — CREATININE, SERUM
Creatinine, Ser: 1.49 mg/dL — ABNORMAL HIGH (ref 0.44–1.00)
GFR calc Af Amer: 38 mL/min — ABNORMAL LOW (ref >60)
GFR calc non Af Amer: 33 mL/min — ABNORMAL LOW (ref >60)

## 2020-04-25 ENCOUNTER — Other Ambulatory Visit: Payer: Self-pay

## 2020-04-25 DIAGNOSIS — R1900 Intra-abdominal and pelvic swelling, mass and lump, unspecified site: Secondary | ICD-10-CM

## 2020-05-15 ENCOUNTER — Other Ambulatory Visit: Payer: Self-pay

## 2020-05-15 ENCOUNTER — Ambulatory Visit: Payer: Medicare Other | Admitting: Family

## 2020-05-15 ENCOUNTER — Ambulatory Visit (HOSPITAL_COMMUNITY)
Admission: RE | Admit: 2020-05-15 | Discharge: 2020-05-15 | Disposition: A | Discharge status: Home / Self Care | Payer: Medicare Other | Source: Ambulatory Visit | Attending: Gastroenterology | Admitting: Gastroenterology

## 2020-05-15 DIAGNOSIS — R1900 Intra-abdominal and pelvic swelling, mass and lump, unspecified site: Secondary | ICD-10-CM | POA: Diagnosis not present

## 2020-05-28 ENCOUNTER — Other Ambulatory Visit: Payer: Self-pay | Admitting: Infectious Disease

## 2020-05-28 ENCOUNTER — Encounter (HOSPITAL_COMMUNITY): Payer: Self-pay | Admitting: Emergency Medicine

## 2020-05-28 ENCOUNTER — Emergency Department (HOSPITAL_COMMUNITY): Payer: Medicare Other

## 2020-05-28 ENCOUNTER — Emergency Department (HOSPITAL_COMMUNITY)
Admission: EM | Admit: 2020-05-28 | Discharge: 2020-05-28 | Disposition: A | Discharge status: Home / Self Care | Payer: Medicare Other | Attending: Emergency Medicine | Admitting: Emergency Medicine

## 2020-05-28 ENCOUNTER — Other Ambulatory Visit: Payer: Self-pay

## 2020-05-28 ENCOUNTER — Telehealth: Payer: Self-pay | Admitting: *Deleted

## 2020-05-28 DIAGNOSIS — J449 Chronic obstructive pulmonary disease, unspecified: Secondary | ICD-10-CM | POA: Diagnosis not present

## 2020-05-28 DIAGNOSIS — R001 Bradycardia, unspecified: Secondary | ICD-10-CM | POA: Insufficient documentation

## 2020-05-28 DIAGNOSIS — R197 Diarrhea, unspecified: Secondary | ICD-10-CM

## 2020-05-28 DIAGNOSIS — E039 Hypothyroidism, unspecified: Secondary | ICD-10-CM | POA: Diagnosis not present

## 2020-05-28 DIAGNOSIS — I129 Hypertensive chronic kidney disease with stage 1 through stage 4 chronic kidney disease, or unspecified chronic kidney disease: Secondary | ICD-10-CM | POA: Insufficient documentation

## 2020-05-28 DIAGNOSIS — R1084 Generalized abdominal pain: Secondary | ICD-10-CM

## 2020-05-28 DIAGNOSIS — B20 Human immunodeficiency virus [HIV] disease: Secondary | ICD-10-CM

## 2020-05-28 DIAGNOSIS — N1831 Chronic kidney disease, stage 3a: Secondary | ICD-10-CM | POA: Diagnosis not present

## 2020-05-28 DIAGNOSIS — N3001 Acute cystitis with hematuria: Secondary | ICD-10-CM | POA: Diagnosis not present

## 2020-05-28 LAB — CBC WITH DIFFERENTIAL/PLATELET
Abs Immature Granulocytes: 0.01 10*3/uL (ref 0.00–0.07)
Basophils Absolute: 0 10*3/uL (ref 0.0–0.1)
Basophils Relative: 0 %
Eosinophils Absolute: 0.1 10*3/uL (ref 0.0–0.5)
Eosinophils Relative: 1 %
HCT: 44.6 % (ref 36.0–46.0)
Hemoglobin: 14.9 g/dL (ref 12.0–15.0)
Immature Granulocytes: 0 %
Lymphocytes Relative: 32 %
Lymphs Abs: 2.8 10*3/uL (ref 0.7–4.0)
MCH: 34.6 pg — ABNORMAL HIGH (ref 26.0–34.0)
MCHC: 33.4 g/dL (ref 30.0–36.0)
MCV: 103.5 fL — ABNORMAL HIGH (ref 80.0–100.0)
Monocytes Absolute: 0.8 10*3/uL (ref 0.1–1.0)
Monocytes Relative: 9 %
Neutro Abs: 5 10*3/uL (ref 1.7–7.7)
Neutrophils Relative %: 58 %
Platelets: 200 10*3/uL (ref 150–400)
RBC: 4.31 MIL/uL (ref 3.87–5.11)
RDW: 12.2 % (ref 11.5–15.5)
WBC: 8.7 10*3/uL (ref 4.0–10.5)
nRBC: 0 % (ref 0.0–0.2)

## 2020-05-28 LAB — URINALYSIS, ROUTINE W REFLEX MICROSCOPIC
Bilirubin Urine: NEGATIVE
Glucose, UA: NEGATIVE mg/dL
Hgb urine dipstick: NEGATIVE
Ketones, ur: NEGATIVE mg/dL
Nitrite: POSITIVE — AB
Protein, ur: NEGATIVE mg/dL
Specific Gravity, Urine: 1.009 (ref 1.005–1.030)
WBC, UA: >50 WBC/hpf — ABNORMAL HIGH (ref 0–5)
pH: 5 (ref 5.0–8.0)

## 2020-05-28 LAB — COMPREHENSIVE METABOLIC PANEL
ALT: 21 U/L (ref 0–44)
AST: 26 U/L (ref 15–41)
Albumin: 4.7 g/dL (ref 3.5–5.0)
Alkaline Phosphatase: 60 U/L (ref 38–126)
Anion gap: 7 (ref 5–15)
BUN: 29 mg/dL — ABNORMAL HIGH (ref 8–23)
CO2: 24 mmol/L (ref 22–32)
Calcium: 10.6 mg/dL — ABNORMAL HIGH (ref 8.9–10.3)
Chloride: 107 mmol/L (ref 98–111)
Creatinine, Ser: 1.56 mg/dL — ABNORMAL HIGH (ref 0.44–1.00)
GFR calc Af Amer: 36 mL/min — ABNORMAL LOW (ref >60)
GFR calc non Af Amer: 31 mL/min — ABNORMAL LOW (ref >60)
Glucose, Bld: 110 mg/dL — ABNORMAL HIGH (ref 70–99)
Potassium: 4.9 mmol/L (ref 3.5–5.1)
Sodium: 138 mmol/L (ref 135–145)
Total Bilirubin: 1.1 mg/dL (ref 0.3–1.2)
Total Protein: 7.2 g/dL (ref 6.5–8.1)

## 2020-05-28 LAB — TROPONIN I (HIGH SENSITIVITY)
Troponin I (High Sensitivity): 5 ng/L (ref <18)
Troponin I (High Sensitivity): 5 ng/L (ref <18)

## 2020-05-28 LAB — C DIFFICILE QUICK SCREEN W PCR REFLEX
C Diff antigen: NEGATIVE
C Diff interpretation: NOT DETECTED
C Diff toxin: NEGATIVE

## 2020-05-28 LAB — LIPASE, BLOOD: Lipase: 37 U/L (ref 11–51)

## 2020-05-28 LAB — TYPE AND SCREEN
ABO/RH(D): A NEG
Antibody Screen: NEGATIVE

## 2020-05-28 LAB — POC OCCULT BLOOD, ED: Fecal Occult Bld: NEGATIVE

## 2020-05-28 LAB — PROTIME-INR
INR: 1 (ref 0.8–1.2)
Prothrombin Time: 13.1 seconds (ref 11.4–15.2)

## 2020-05-28 MED ORDER — CEPHALEXIN 500 MG PO CAPS
500.0000 mg | ORAL_CAPSULE | Freq: Three times a day (TID) | ORAL | 0 refills | Status: DC
Start: 2020-05-28 — End: 2020-07-30

## 2020-05-28 MED ORDER — SODIUM CHLORIDE 0.9 % IV BOLUS
500.0000 mL | Freq: Once | INTRAVENOUS | Status: AC
  Administered 2020-05-28: 500 mL via INTRAVENOUS

## 2020-05-28 MED ORDER — IOHEXOL 300 MG/ML  SOLN: 80.0000 mL | Freq: Once | INTRAMUSCULAR | Status: DC | PRN

## 2020-05-28 MED ORDER — SODIUM CHLORIDE 0.9 % IV SOLN
1.0000 g | Freq: Once | INTRAVENOUS | Status: AC
  Administered 2020-05-28: 1 g via INTRAVENOUS
  Filled 2020-05-28: qty 10

## 2020-05-28 MED ORDER — AMOXICILLIN-POT CLAVULANATE 875-125 MG PO TABS
1.0000 | ORAL_TABLET | Freq: Two times a day (BID) | ORAL | 0 refills | Status: DC
Start: 2020-05-28 — End: 2020-07-30

## 2020-05-28 MED ORDER — ACETAMINOPHEN 325 MG PO TABS
650.0000 mg | ORAL_TABLET | Freq: Once | ORAL | Status: AC
  Administered 2020-05-28: 650 mg via ORAL
  Filled 2020-05-28: qty 2

## 2020-05-28 NOTE — Telephone Encounter (Signed)
Pharmacy called related to Rx: Augmentin vs Keflex .Marland KitchenMarland KitchenEDCM clarified with EDP (Couture) to change Rx to: Keflex.

## 2020-05-28 NOTE — ED Notes (Signed)
Colostomy wafer and bag changed due to leaking

## 2020-05-28 NOTE — ED Notes (Signed)
Pt ambulated well. Pt held my hand while walking around in the room. Pt states that she uses a cane sometimes. Pt also states that she is a little dizzy.

## 2020-05-28 NOTE — ED Notes (Signed)
Spoke with Pts daughter-in-law, Who will be coming to pick her up.  Nurse informed.

## 2020-05-28 NOTE — Discharge Instructions (Addendum)
Take the antibiotics for suspected urinary tract infection.  Follow-up with your doctor as well as the gastroenterologist to schedule your EGD and colonoscopy.  Return to the ED with worsening pain, fever, vomiting, any other concerns. Use Kaopectate for diarrhea.

## 2020-05-28 NOTE — ED Provider Notes (Signed)
[facility] EMERGENCY DEPARTMENT Provider Note   CSN: [id number] Arrival date & time: 05/28/20  0221     History Chief Complaint  Patient presents with  . bleeding from colostomy    [name] is a 79 y.o. female.  Patient with history of COPD, CKD, HIV, colostomy due to colon rupture here with bleeding from her colostomy bag noticed this evening.  Patient states she has had diarrhea for the past 3 or 4 days with multiple episodes per day necessitating changing her back to 4-5 times daily.  Stool has been loose, brown and watery.  She normally changes the bag once a day.  Today while changing the bag this evening she noticed blood from her stoma with some bleeding and clots.  Does not think stool has been dark or bloody.  She denies any blood thinner use.  She said nausea but no vomiting.  No fever.  States her roommate has been sick as well with diarrhea.  Denies any recent antibiotic use or travel.  She has diffuse crampy abdominal pain that onset today but is new as well.  No chest pain or shortness of breath.  Per gastroenterology notes, patient is overdue for both EGD and colonoscopy which are in the process of being scheduled this summer.  He was found to have polyps on her last colonoscopy.  The history is provided by the patient.       Past Medical History:  Diagnosis Date  . Anxiety   . Arthritis   . Bronchitis   . CKD (chronic kidney disease) stage 3, GFR 30-59 ml/min 05/04/2016  . COPD (chronic obstructive pulmonary disease) ([place])   . Depression   . Dysuria 03/27/2019  . Elevated LFTs 05/04/2016  . History of TIAs 1981   left side weakness  . HIV (human immunodeficiency virus infection) ([place])   . Hypertension   . Kidney disease related to HIV infection [facility])    pt states she has to have her creatinine level checked often   . Nausea 03/03/2017  . Peripheral neuropathy    bilateral feet  . Seasonal allergies 03/27/2019  . Sinusitis 05/06/2018  . UTI (urinary tract  infection) 03/03/2017  . Weight loss 03/03/2017    Patient Active Problem List   Diagnosis Date Noted  . Abdominal wall bulge 04/22/2020  . Acute renal failure superimposed on stage 3b chronic kidney disease ()   . History of colonic polyps 07/06/2019  . History of partial colectomy 07/06/2019  . Seasonal allergies 03/27/2019  . Dysuria 03/27/2019  . Obesity (BMI 30-39.9) 11/12/2018  . Constipation due to opioid therapy 11/12/2018  . Lactic acidosis 11/12/2018  . Large bowel perforation ([place]) 11/12/2018  . Peritonitis ([place]) 11/12/2018  . Stercoral ulcer of large intestine 11/12/2018  . Pneumoperitoneum   . COPD (chronic obstructive pulmonary disease) ([place]) 12/17/2017  . Hypothyroidism 12/17/2017  . 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 UTI (urinary tract infection) 03/03/2017  . Nausea 03/03/2017  . Weight loss 03/03/2017  . CAP (community acquired pneumonia) 07/22/2016  . CKD (chronic kidney disease) stage 3, GFR 30-59 ml/min 05/04/2016  . Elevated LFTs 05/04/2016  . 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 Breath shortness 06/19/2014  . Syncope 05/31/2014  . Rib fracture 05/31/2014  . Weight gain 03/21/2014  . Cervical spondylosis 08/15/2013  . Neck pain on left side 05/24/2013  . Unspecified vitamin D deficiency 12/14/2012  . Vitamin D toxicity 12/14/2012  . Hemorrhoid 09/01/2012  . Myalgia 02/25/2012  .  HTN (hypertension) 11/25/2011  . Cramps, muscle, general 08/18/2011  . RECTAL BLEEDING 11/03/2010  . DYSPHAGIA 11/03/2010  . FATIGUE 08/29/2010  . MUSCLE PAIN 06/04/2010  . Cystitis 05/12/2010  . RENAL INSUFFICIENCY 12/18/2008  . DEPRESSION 09/04/2008  . IRRITABLE BOWEL SYNDROME 05/22/2008  . HERNIATED CERVICAL DISC 10/05/2007  . HERNIATED LUMBAR DISC 10/05/2007  . History of urinary tract infection 09/20/2007  . HIV-1 associated autonomic neuropathy ([place]) 03/16/2007  . ELBOW PAIN 03/16/2007  . ISCHEMIC COLITIS 12/27/2006  . RENAL FAILURE NOS  12/27/2006  . HIV infection ([place]) 08/27/2006    Past Surgical History:  Procedure Laterality Date  . ABDOMINAL HYSTERECTOMY  1985  . BACK SURGERY    . CATARACT EXTRACTION, BILATERAL    . CHOLECYSTECTOMY    . COLONOSCOPY  08/10/2002   NUR: Normal colonoscopy except for some hemorrhoids and some erythema at the dentate line  . COLONOSCOPY  12/10/2006   Edwards:Diffuse colitis in the transverse and descending colon/This is not entirely typical of ischemic colitis or her HIV positive/ disease.  We need to be concerned about other causes  . COLONOSCOPY  01/19/2011   RMR: Internal and external hemorrhoids, likely source of hematochezia anal papilla, otherwise normal rectal mucosa/ Left-sided diverticula.  Cecal polyp with status post cold snare polypectomy.  Remainder of colonic mucosa appeared normal. Pathology with tubular adenoma. Repeat in 2017  . COLOSTOMY N/A 11/12/2018   Procedure: COLOSTOMY;  Surgeon: Virl Cagey, MD;  Location: [facility];  Service: General;  Laterality: N/A;  . ESOPHAGOGASTRODUODENOSCOPY  08/10/2002     NUR: Mild changes of reflux esophagitis limited to gastroesophageal junction  No evidence of ring, stricture, or esophageal candidiasis dysphagia/ Gastritis, possibly H. pylori induced  . ESOPHAGOGASTRODUODENOSCOPY  12/25/2002   NUR: Erosive antral gastritis.  The degree of gastritis is more significant than her last exam/ Normal examination of the esophagus/ Esophageal dilatation performed by passing 31 and [address] [place]  . ESOPHAGOGASTRODUODENOSCOPY  11/13/2010   RMR: Circumferential distal esophageal erosions with soft peptic stricture, consistent with erosive reflux esophagitis or stricture  formation, status post dilation as described above/  Small hiatal hernia/ Tiny antral erosions, otherwise normal stomach D1 and D2  . HEMORRHOID SURGERY  09/02/2012   Procedure: HEMORRHOIDECTOMY;  Surgeon: Jamesetta So, MD;  Location: [facility];  Service: General;  Laterality:  N/A;  . LAPAROTOMY N/A 11/12/2018   Procedure: EXPLORATORY LAPAROTOMY;  Surgeon: Virl Cagey, MD;  Location: [facility];  Service: General;  Laterality: N/A;  . PARTIAL COLECTOMY N/A 11/12/2018   Procedure: PARTIAL COLECTOMY;  Surgeon: Virl Cagey, MD;  Location: [facility];  Service: General;  Laterality: N/A;  . TUBAL LIGATION       OB History    Gravida  4   Para  3   Term  2   Preterm  1   AB  1   Living  3     SAB  1   TAB      Ectopic      Multiple      Live Births  3           Family History  Problem Relation Age of Onset  . Diabetes Mother   . Diabetes Brother   . Colon cancer Brother        Diagnosed > age 46    Social History   Tobacco Use  . Smoking status: Never Smoker  . Smokeless tobacco: Never Used  Vaping Use  .  Vaping Use: Never used  Substance Use Topics  . Alcohol use: No  . Drug use: No    Home Medications Prior to Admission medications   Medication Sig Start Date End Date Taking? Authorizing Provider  abacavir-dolutegravir-lamiVUDine (TRIUMEQ) 600-50-300 MG tablet Take 1 tablet by mouth daily. 04/15/20   Truman Hayward, MD  albuterol (PROVENTIL HFA;VENTOLIN HFA) [phone] Base) MCG/ACT inhaler Inhale 1-2 puffs into the lungs every 6 (six) hours as needed for wheezing or shortness of breath. 04/02/17   Nat Christen, MD  amitriptyline (ELAVIL) 50 MG tablet Take 1 tablet (50 mg total) by mouth at bedtime. Patient not taking: Reported on 04/22/2020 12/31/19   Barton Dubois, MD  Cholecalciferol (VITAMIN D3) 10000 units TABS Take 1 tablet by mouth daily.    [provider]  diphenhydrAMINE (BENADRYL) 25 mg capsule Take 25 mg by mouth daily as needed for itching (itching from codeine).     [provider]  docusate sodium (COLACE) 100 MG capsule Take 1 capsule (100 mg total) by mouth 2 (two) times daily. Patient not taking: Reported on 04/22/2020 11/21/18   Virl Cagey, MD  fluocinonide-emollient (LIDEX-E) 0.05  % cream Apply 1 application topically 2 (two) times daily. Patient taking differently: Apply 1 application topically 2 (two) times daily as needed (for skin irritation).  05/23/19   Florian Buff, MD  fluticasone (FLONASE) 50 MCG/ACT nasal spray 2 SPRAYS INTO BOTH NOSTRILS AT BEDTIME. Patient taking differently: Place 2 sprays into both nostrils at bedtime.  05/22/19   Truman Hayward, MD  furosemide (LASIX) 40 MG tablet Take 1 tablet (40 mg total) by mouth daily as needed for fluid. Hold until follow up with PCP 12/31/19   Barton Dubois, MD  ipratropium-albuterol (DUONEB) 0.5-2.5 (3) MG/3ML SOLN Inhale 3 mLs into the lungs every 6 (six) hours as needed (shortness of breath).  04/02/17   [provider]  LORazepam (ATIVAN) 0.5 MG tablet Take 0.5 mg by mouth 2 (two) times daily.  11/01/18   [provider]  magnesium 30 MG tablet Take 30 mg by mouth at bedtime.     [provider]  Multiple Vitamin (ONE-A-DAY 55 PLUS PO) Take 1 tablet by mouth daily.    [provider]  nebivolol (BYSTOLIC) 5 MG tablet Take 1 tablet (5 mg total) by mouth daily. 02/27/20   Arnoldo Lenis, MD  Omega-3 Fatty Acids (FISH OIL BURP-LESS) 1000 MG CAPS Take 1 capsule by mouth daily.    [provider]  ondansetron (ZOFRAN) 8 MG tablet Take 8 mg by mouth daily as needed for nausea or vomiting.     [provider]  Oxycodone HCl 10 MG TABS Take 10 mg by mouth 2 (two) times daily as needed (for pain).    [provider]  polyvinyl alcohol (LIQUIFILM TEARS) 1.4 % ophthalmic solution Place 1 drop into both eyes daily.     [provider]  solifenacin (VESICARE) 10 MG tablet Take 1 tablet (10 mg total) by mouth daily. 05/23/19   Florian Buff, MD  thiamine (VITAMIN B-1) 100 MG tablet Take 100 mg by mouth daily.    [provider]    Allergies    Bee venom, Promethazine, Tenofovir disoproxil fumarate [tenofovir disoproxil fumarate], Compazine   [prochlorperazine edisylate], Haloperidol lactate, Brompheniramine-acetaminophen, Chlorcyclizine, Chlorpromazine, Codeine, Navane [thiothixene], Prednisone, Promethazine hcl, Propoxyphene n-acetaminophen, and Morphine  Review of Systems   Review of Systems  Constitutional: Positive for activity change, appetite  change and fatigue. Negative for fever.  HENT: Negative for congestion.   Respiratory: Negative for cough, chest tightness and shortness of breath.   Cardiovascular: Negative for chest pain.  Gastrointestinal: Positive for abdominal pain, blood in stool, diarrhea and nausea. Negative for rectal pain and vomiting.  Genitourinary: Negative for dysuria and hematuria.  Musculoskeletal: Negative for arthralgias.  Skin: Negative for rash.  Neurological: Positive for light-headedness. Negative for dizziness, weakness and headaches.   all other systems are negative except as noted in the HPI and PMH.    Physical Exam Updated Vital Signs BP (!) 126/39   Pulse (!) 47   Temp 98.1 F (36.7 C) (Oral)   Resp 15   Ht 5\' 7"  (1.702 m)   Wt 77.1 kg   SpO2 96%   BMI 26.63 kg/m   Physical Exam Vitals and nursing note reviewed.  Constitutional:      General: She is not in acute distress.    Appearance: She is well-developed.  HENT:     Head: Normocephalic and atraumatic.     Mouth/Throat:     Pharynx: No oropharyngeal exudate.  Eyes:     Conjunctiva/sclera: Conjunctivae normal.     Pupils: Pupils are equal, round, and reactive to light.  Neck:     Comments: No meningismus. Cardiovascular:     Rate and Rhythm: Regular rhythm. Bradycardia present.     Heart sounds: Normal heart sounds. No murmur heard.   Pulmonary:     Effort: Pulmonary effort is normal. No respiratory distress.     Breath sounds: Normal breath sounds.  Abdominal:     Palpations: Abdomen is soft.     Tenderness: There is abdominal tenderness. There is no guarding or rebound.     Comments: Left lower quadrant  colostomy filled with gas and stool.  Abdomen is soft and diffusely tender.  Musculoskeletal:        General: No tenderness. Normal range of motion.     Cervical back: Normal range of motion and neck supple.  Skin:    General: Skin is warm.  Neurological:     Mental Status: She is alert and oriented to person, place, and time.     Cranial Nerves: No cranial nerve deficit.     Motor: No abnormal muscle tone.     Coordination: Coordination normal.     Comments:  5/5 strength throughout. CN 2-12 intact.Equal grip strength.   Psychiatric:        Behavior: Behavior normal.     ED Results / Procedures / Treatments   Labs (all labs ordered are listed, but only abnormal results are displayed) Labs Reviewed  CBC WITH DIFFERENTIAL/PLATELET - Abnormal; Notable for the following components:      Result Value   MCV 103.5 (*)    MCH 34.6 (*)    All other components within normal limits  COMPREHENSIVE METABOLIC PANEL - Abnormal; Notable for the following components:   Glucose, Bld 110 (*)    BUN 29 (*)    Creatinine, Ser 1.56 (*)    Calcium 10.6 (*)    GFR calc non Af Amer 31 (*)    GFR calc Af Amer 36 (*)    All other components within normal limits  URINALYSIS, ROUTINE W REFLEX MICROSCOPIC - Abnormal; Notable for the following components:   APPearance HAZY (*)    Nitrite POSITIVE (*)    Leukocytes,Ua MODERATE (*)    WBC, UA >50 (*)    Bacteria, UA MANY (*)  All other components within normal limits  URINE CULTURE  C DIFFICILE QUICK SCREEN W PCR REFLEX  LIPASE, BLOOD  PROTIME-INR  POC OCCULT BLOOD, ED  TYPE AND SCREEN  TROPONIN I (HIGH SENSITIVITY)    EKG EKG Interpretation  Date/Time:  Tuesday May 28 2020 02:53:09 EDT Ventricular Rate:  48 PR Interval:    QRS Duration: 89 QT Interval:  492 QTC Calculation: 440 R Axis:   26 Text Interpretation: Sinus bradycardia Rate slower Confirmed by Ezequiel Essex [phone]) on 05/28/2020 3:03:09 AM   Radiology CT ABDOMEN  PELVIS WO CONTRAST  Result Date: 05/28/2020 CLINICAL DATA:  Diarrhea with blood in colostomy. EXAM: CT ABDOMEN AND PELVIS WITHOUT CONTRAST TECHNIQUE: Multidetector CT imaging of the abdomen and pelvis was performed following the standard protocol without IV contrast. COMPARISON:  05/15/2020 FINDINGS: Lower chest:  No contributory findings. Hepatobiliary: No focal liver abnormality.Cholecystectomy. No bile duct dilatation. Pancreas: Diffuse fatty atrophy. Spleen: Unremarkable. Adrenals/Urinary Tract: Long-standing left adrenal mass measuring 17 mm, consistent with adenoma. No hydronephrosis or stone. Borderline bladder wall thickening accentuated by under distension. Stomach/Bowel: Descending colostomy with parastomal hernia. No evidence of bowel inflammation or obstruction. Diverticulosis of the remaining distal colon. Small sliding hiatal hernia. Vascular/Lymphatic: No acute vascular abnormality. Scattered atheromatous calcifications. No mass or adenopathy. Reproductive:Hysterectomy.  18 mm cystic density at the right ovary. Other: No ascites or pneumoperitoneum. Parastomal fatty hernia on the left. Midline hernia containing fat and non thickened small bowel loops. Fatty enlargement of the left more than right inguinal canal. Musculoskeletal: No acute abnormalities. Lumbar spine degeneration with multilevel laminectomy. IMPRESSION: 1. No bowel wall thickening to correlate with the history. 2. Transverse colostomy with fatty parastomal hernia. 3. Inguinal and midline hernias without interval change. 4.  Aortic Atherosclerosis (ICD10-I70.0). 5. Distal colonic diverticulosis. Electronically Signed   By: Monte Fantasia M.D.   On: 05/28/2020 07:11    Procedures Procedures (including critical care time)  Medications Ordered in ED Medications - No data to display  ED Course  I have reviewed the triage vital signs and the nursing notes.  Pertinent labs & imaging results that were available during my care of  the patient were reviewed by me and considered in my medical decision making (see chart for details).    MDM Rules/Calculators/A&P                         Bleeding from colostomy bag.  Hemodynamics are stable.  No blood thinner use.  Patient with diarrhea for the past several days and abdominal pain  Hemoglobin is stable.  Hemoccult of colostomy output is surprisingly negative.  Does appear to be pink in color.  Hemoccult is negative on multiple checks.  Her colostomy output appears to have red and pink food particles but is negative for hemoglobin or blood.  Hemoglobin remained stable.  Labs show stable creatinine.  Urinalysis is positive and culture will be sent.  IV Rocephin given. We will also send stool studies.  CT scan negative for acute bowel wall thickening or obstruction.  Postsurgical changes are stable.  Patient remains bradycardic but states she has a history of this.  Will give p.o. fluids and attempt ambulation. Her EKG is sinus bradycardia in the 50s.  She denies any dizziness or lightheadedness.  We will attempt p.o. challenge and ambulation.  IV Rocephin given.  Anticipate discharge home with p.o. antibiotics if she is able to tolerate p.o.  Care to be transferred at shift change.  Final Clinical Impression(s) / ED Diagnoses Final diagnoses:  Generalized abdominal pain  Acute cystitis with hematuria    Rx / DC Orders ED Discharge Orders    None       Taveon Enyeart, Annie Main, MD 05/28/20 [phone]

## 2020-05-28 NOTE — ED Triage Notes (Signed)
Patient brought in by EMS for bleeding from her colostomy site. Patient states that she noticed blood and clots in her bag when changing it. EMS has an IV established and states that patient appears to be dehydrated. Patient has had her colostomy x 1 year.

## 2020-05-28 NOTE — ED Notes (Signed)
Offered 220ml of ginger ale

## 2020-05-28 NOTE — ED Provider Notes (Signed)
7:45 AM-patient presented for evaluation of bleeding from her ostomy, and has had comprehensive evaluation.  She is found to have a UTI, and is currently awaiting p.o. fluid challenge, and has received IV Rocephin.  Anticipate discharge.     Daleen Bo, MD 05/29/20 [phone]

## 2020-05-28 NOTE — ED Notes (Signed)
Patient transported to CT 

## 2020-05-29 ENCOUNTER — Telehealth: Payer: Self-pay | Admitting: General Practice

## 2020-05-29 NOTE — Telephone Encounter (Signed)
Reviewed

## 2020-05-29 NOTE — Telephone Encounter (Signed)
Patient called in stating she's having trouble with her colostomy bag it is leaking.  I spoke with Almyra Free and she stated the patient needs to call Dr. Constance Haw office regarding her colostomy bag.  Patient voiced understanding and stated she will give Dr. Constance Haw office a call.

## 2020-05-30 ENCOUNTER — Other Ambulatory Visit: Payer: Self-pay

## 2020-05-30 ENCOUNTER — Ambulatory Visit: Payer: Medicare Other | Admitting: General Surgery

## 2020-05-30 DIAGNOSIS — B20 Human immunodeficiency virus [HIV] disease: Secondary | ICD-10-CM

## 2020-05-30 LAB — URINE CULTURE: Culture: >=100000 — AB

## 2020-05-30 MED ORDER — TRIUMEQ 600-50-300 MG PO TABS: 1.0000 | ORAL_TABLET | Freq: Every day | ORAL | 0 refills | Status: DC

## 2020-06-01 ENCOUNTER — Telehealth: Payer: Self-pay | Admitting: Emergency Medicine

## 2020-06-01 NOTE — Telephone Encounter (Signed)
Post ED Visit - Positive Culture Follow-up  Culture report reviewed by antimicrobial stewardship pharmacist: [place] Team []  Elenor Quinones, Pharm.D. []  Heide Guile, Pharm.D., BCPS AQ-ID []  Parks Neptune, Pharm.D., BCPS []  Alycia Rossetti, Pharm.D., BCPS []  [place], Pharm.D., BCPS, AAHIVP []  Legrand Como, Pharm.D., BCPS, AAHIVP []  Salome Arnt, PharmD, BCPS []  Johnnette Gourd, PharmD, BCPS []  Hughes Better, PharmD, BCPS [x]  Norina Buzzard, PharmD []  Laqueta Linden, PharmD, BCPS []  Albertina Parr, PharmD  [place] Team []  Leodis Sias, PharmD []  Lindell Spar, PharmD []  Royetta Asal, PharmD []  Graylin Shiver, Rph []  Rema Fendt) Glennon Mac, PharmD []  Arlyn Dunning, PharmD []  Netta Cedars, PharmD []  Dia Sitter, PharmD []  Leone Haven, PharmD []  Gretta Arab, PharmD []  Theodis Shove, PharmD []  Peggyann Juba, PharmD []  Reuel Boom, PharmD   Positive urine culture Treated with Cephalexin, organism sensitive to the same and no further patient follow-up is required at this time.  Sandi Raveling [name] 06/01/2020, 5:40 PM

## 2020-06-03 ENCOUNTER — Encounter: Payer: Self-pay | Admitting: *Deleted

## 2020-06-03 ENCOUNTER — Telehealth: Payer: Self-pay | Admitting: *Deleted

## 2020-06-03 NOTE — Telephone Encounter (Signed)
Per Tool:  [facility] Clinical Pool: Please arrange EGD +/- dilation + TCS via ostomy and flex sig with propofol with Dr. Gala Romney.  ASA III Dx: History of partial colectomy with ostomy, history of adenomatous colon polyps, nausea, and dysphagia.  ---   Called patient and she is scheduled for 07/15/2020 at 8:15am. Aware will need pre-op/covid test prior. Also aware will mail prep instructions with this appt. Confirmed mailing address.

## 2020-06-04 ENCOUNTER — Telehealth: Payer: Self-pay | Admitting: *Deleted

## 2020-06-04 NOTE — Telephone Encounter (Signed)
PA submitted via [employer] website. PA pending. Track# [id number]

## 2020-06-06 ENCOUNTER — Ambulatory Visit (INDEPENDENT_AMBULATORY_CARE_PROVIDER_SITE_OTHER): Payer: Medicare Other | Admitting: Family

## 2020-06-06 ENCOUNTER — Other Ambulatory Visit: Payer: Self-pay

## 2020-06-06 ENCOUNTER — Encounter: Payer: Self-pay | Admitting: Family

## 2020-06-06 VITALS — BP 115/69 | HR 44 | Temp 97.5°F | Wt 171.0 lb

## 2020-06-06 DIAGNOSIS — Z21 Asymptomatic human immunodeficiency virus [HIV] infection status: Secondary | ICD-10-CM | POA: Diagnosis not present

## 2020-06-06 DIAGNOSIS — Z79899 Other long term (current) drug therapy: Secondary | ICD-10-CM

## 2020-06-06 DIAGNOSIS — Z113 Encounter for screening for infections with a predominantly sexual mode of transmission: Secondary | ICD-10-CM | POA: Diagnosis not present

## 2020-06-06 DIAGNOSIS — Z0000 Encounter for general adult medical examination without abnormal findings: Secondary | ICD-10-CM

## 2020-06-06 DIAGNOSIS — Z599 Problem related to housing and economic circumstances, unspecified: Secondary | ICD-10-CM

## 2020-06-06 MED ORDER — TRIUMEQ 600-50-300 MG PO TABS: 1.0000 | ORAL_TABLET | Freq: Every day | ORAL | 5 refills | Status: DC

## 2020-06-06 NOTE — Assessment & Plan Note (Signed)
·   Discussed/recommended Covid vaccination with resources provided.  Rest importance of safe sexual practice to reduce risk of STI.  Condoms declined.  Routine dental care not necessary with dentures.

## 2020-06-06 NOTE — Patient Instructions (Addendum)
Nice to see you.  We will check your blood work today.  Continue to take your Triumeq daily.  See your local pharmacy for COVID vaccine.   Refills have been sent to the pharmacy.  Contact Georgiann Cocker to see about help with housing at [phone].  Plan for follow up with Dr. [name] in 6 months or sooner if needed.   Have a great day!

## 2020-06-06 NOTE — Assessment & Plan Note (Signed)
[name] has been having housing difficulties with her roommate who has psychiatric/mental health conditions.  Severity of her problems enough that she would like to move out of her house/apartment.  Does not appear to be in any present danger as she is here with her roommate today.  Resources provided to assist in finding new housing.  Advised to seek further assistance from law enforcement and/or legal counsel if necessary.  No further assistance needed from health care at this time.

## 2020-06-06 NOTE — Progress Notes (Signed)
Subjective:    Patient ID: [name], female    DOB: 30-Mar-1941, 79 y.o.   MRN: [id number]  Chief Complaint  Patient presents with  . Follow-up     HPI:  [name] is a 79 y.o. female with HIV disease who was last seen in the office on 03/27/19 with good adherence and tolerance to her ART regimen of Triumeq. Blood work at the time showed a viral load that was undetectable and CD4 count of 879. Recently seen in the ED on 05/28/20 for abdominal pain and bleeding from her ostomy. She was found to have UTI and treated with IV Rocephin.  Here today for routine follow-up.  [name] continues to take her Triumeq daily as prescribed with no adverse side effects.  She does have the occasional missed dose.  Overall feeling well today.  She has concerns regarding her current living situation with her roommate who has psychiatric/mental health conditions describing to [place] that she does not feel safe at home at times.  Remains mood fluctuates.  Has been well controlled when taking medication. Denies fevers, chills, night sweats, headaches, changes in vision, neck pain/stiffness, nausea, diarrhea, vomiting, lesions or rashes.  [name] remains covered through [place] healthcare Medicare and has no problems obtaining her medication from the pharmacy.  Denies feelings of being down, depressed, or hopeless recently.  No recreational or illicit drug use, tobacco use, or alcohol consumption.  She is not currently sexually active.  Interested in obtaining information on housing assistance.  Has not received her Covid vaccine.  She has dentures does not require routine dental care.      Allergies  Allergen Reactions  . Bee Venom Anaphylaxis  . Promethazine Swelling  . Tenofovir Disoproxil Fumarate [Tenofovir Disoproxil Fumarate] Other (See Comments)    Renal failure, (08/27/2006)  . Compazine  [Prochlorperazine Edisylate] Nausea And Vomiting  . Haloperidol Lactate Other (See Comments)     Reaction is muscle tension, causes severe spasms in face and neck. Forces eyes to roll in the back of the head  . Brompheniramine-Acetaminophen   . Chlorcyclizine   . Chlorpromazine   . Codeine Itching and Nausea And Vomiting  . Navane [Thiothixene] Other (See Comments) and Nausea And Vomiting    Same muscle spasm reaction as haldol  . Prednisone     Brief mild psychosis and agitation  . Promethazine Hcl Other (See Comments)  . Propoxyphene N-Acetaminophen Itching and Nausea And Vomiting  . Morphine Itching and Swelling      Outpatient Medications Prior to Visit  Medication Sig Dispense Refill  . albuterol (PROVENTIL HFA;VENTOLIN HFA) 108 (90 Base) MCG/ACT inhaler Inhale 1-2 puffs into the lungs every 6 (six) hours as needed for wheezing or shortness of breath. 1 Inhaler 0  . amoxicillin-clavulanate (AUGMENTIN) 875-125 MG tablet Take 1 tablet by mouth 2 (two) times daily. One po bid x 7 days 14 tablet 0  . Cholecalciferol (VITAMIN D3) 10000 units TABS Take 1 tablet by mouth daily.    . diphenhydrAMINE (BENADRYL) 25 mg capsule Take 25 mg by mouth daily as needed for itching (itching from codeine).     Marland Kitchen escitalopram (LEXAPRO) 10 MG tablet Take 10 mg by mouth daily.    . fluticasone (FLONASE) 50 MCG/ACT nasal spray 2 SPRAYS INTO BOTH NOSTRILS AT BEDTIME. (Patient taking differently: Place 2 sprays into both nostrils at bedtime. ) 16 g 5  . furosemide (LASIX) 40 MG tablet Take 1 tablet (40 mg total) by mouth  daily as needed for fluid. Hold until follow up with PCP    . ipratropium-albuterol (DUONEB) 0.5-2.5 (3) MG/3ML SOLN Inhale 3 mLs into the lungs every 6 (six) hours as needed (shortness of breath).     . LORazepam (ATIVAN) 0.5 MG tablet Take 0.5 mg by mouth 2 (two) times daily.     . Multiple Vitamin (ONE-A-DAY 55 PLUS PO) Take 1 tablet by mouth daily.    . nebivolol (BYSTOLIC) 5 MG tablet Take 1 tablet (5 mg total) by mouth daily. 90 tablet 1  . Omega-3 Fatty Acids (FISH OIL BURP-LESS)  1000 MG CAPS Take 1 capsule by mouth daily.    . ondansetron (ZOFRAN) 8 MG tablet Take 8 mg by mouth daily as needed for nausea or vomiting.     . Oxycodone HCl 10 MG TABS Take 10 mg by mouth 2 (two) times daily as needed (for pain).    Marland Kitchen thiamine (VITAMIN B-1) 100 MG tablet Take 100 mg by mouth daily.    Marland Kitchen abacavir-dolutegravir-lamiVUDine (TRIUMEQ) 600-50-300 MG tablet Take 1 tablet by mouth daily. 30 tablet 0  . amitriptyline (ELAVIL) 50 MG tablet Take 1 tablet (50 mg total) by mouth at bedtime. (Patient not taking: Reported on 04/22/2020) 30 tablet 0  . cephALEXin (KEFLEX) 500 MG capsule Take 1 capsule (500 mg total) by mouth 3 (three) times daily. (Patient not taking: Reported on 06/06/2020) 21 capsule 0  . docusate sodium (COLACE) 100 MG capsule Take 1 capsule (100 mg total) by mouth 2 (two) times daily. (Patient not taking: Reported on 04/22/2020) 10 capsule 0  . fluocinonide-emollient (LIDEX-E) 0.05 % cream Apply 1 application topically 2 (two) times daily. (Patient not taking: Reported on 06/06/2020) 30 g 0  . magnesium 30 MG tablet Take 30 mg by mouth at bedtime.  (Patient not taking: Reported on 06/06/2020)    . polyvinyl alcohol (LIQUIFILM TEARS) 1.4 % ophthalmic solution Place 1 drop into both eyes daily.  (Patient not taking: Reported on 06/06/2020)    . solifenacin (VESICARE) 10 MG tablet Take 1 tablet (10 mg total) by mouth daily. (Patient not taking: Reported on 06/06/2020) 30 tablet 11   No facility-administered medications prior to visit.     Past Medical History:  Diagnosis Date  . Anxiety   . Arthritis   . Bronchitis   . CKD (chronic kidney disease) stage 3, GFR 30-59 ml/min 05/04/2016  . COPD (chronic obstructive pulmonary disease) ([place])   . Depression   . Dysuria 03/27/2019  . Elevated LFTs 05/04/2016  . History of TIAs 1981   left side weakness  . HIV (human immunodeficiency virus infection) ([place])   . Hypertension   . Kidney disease related to HIV infection [facility])    pt  states she has to have her creatinine level checked often   . Nausea 03/03/2017  . Peripheral neuropathy    bilateral feet  . Seasonal allergies 03/27/2019  . Sinusitis 05/06/2018  . UTI (urinary tract infection) 03/03/2017  . Weight loss 03/03/2017     Past Surgical History:  Procedure Laterality Date  . ABDOMINAL HYSTERECTOMY  1985  . BACK SURGERY    . CATARACT EXTRACTION, BILATERAL    . CHOLECYSTECTOMY    . COLONOSCOPY  08/10/2002   NUR: Normal colonoscopy except for some hemorrhoids and some erythema at the dentate line  . COLONOSCOPY  12/10/2006   [place]:Diffuse colitis in the transverse and descending colon/This is not entirely typical of ischemic colitis or her HIV positive/ disease.  We need to be concerned about other causes  . COLONOSCOPY  01/19/2011   RMR: Internal and external hemorrhoids, likely source of hematochezia anal papilla, otherwise normal rectal mucosa/ Left-sided diverticula.  Cecal polyp with status post cold snare polypectomy.  Remainder of colonic mucosa appeared normal. Pathology with tubular adenoma. Repeat in 2017  . COLOSTOMY N/A 11/12/2018   Procedure: COLOSTOMY;  Surgeon: Virl Cagey, MD;  Location: [facility];  Service: General;  Laterality: N/A;  . ESOPHAGOGASTRODUODENOSCOPY  08/10/2002     NUR: Mild changes of reflux esophagitis limited to gastroesophageal junction  No evidence of ring, stricture, or esophageal candidiasis dysphagia/ Gastritis, possibly H. pylori induced  . ESOPHAGOGASTRODUODENOSCOPY  12/25/2002   NUR: Erosive antral gastritis.  The degree of gastritis is more significant than her last exam/ Normal examination of the esophagus/ Esophageal dilatation performed by passing 39 and [address] [place]  . ESOPHAGOGASTRODUODENOSCOPY  11/13/2010   RMR: Circumferential distal esophageal erosions with soft peptic stricture, consistent with erosive reflux esophagitis or stricture  formation, status post dilation as described above/  Small hiatal hernia/  Tiny antral erosions, otherwise normal stomach D1 and D2  . HEMORRHOID SURGERY  09/02/2012   Procedure: HEMORRHOIDECTOMY;  Surgeon: Jamesetta So, MD;  Location: [facility];  Service: General;  Laterality: N/A;  . LAPAROTOMY N/A 11/12/2018   Procedure: EXPLORATORY LAPAROTOMY;  Surgeon: Virl Cagey, MD;  Location: [facility];  Service: General;  Laterality: N/A;  . PARTIAL COLECTOMY N/A 11/12/2018   Procedure: PARTIAL COLECTOMY;  Surgeon: Virl Cagey, MD;  Location: [facility];  Service: General;  Laterality: N/A;  . TUBAL LIGATION         Review of Systems  Constitutional: Negative for appetite change, chills, diaphoresis, fatigue, fever and unexpected weight change.  Eyes:       Negative for acute change in vision  Respiratory: Negative for chest tightness, shortness of breath and wheezing.   Cardiovascular: Negative for chest pain.  Gastrointestinal: Negative for diarrhea, nausea and vomiting.  Genitourinary: Negative for dysuria, pelvic pain and vaginal discharge.  Musculoskeletal: Negative for neck pain and neck stiffness.  Skin: Negative for rash.  Neurological: Negative for seizures, syncope, weakness and headaches.  Hematological: Negative for adenopathy. Does not bruise/bleed easily.  Psychiatric/Behavioral: Negative for hallucinations.      Objective:    BP 115/69   Pulse (!) 44   Temp (!) 97.5 F (36.4 C) (Oral)   Wt 171 lb (77.6 kg)   BMI 26.78 kg/m  Nursing note and vital signs reviewed.  Physical Exam Constitutional:      General: She is not in acute distress.    Appearance: She is well-developed.  Eyes:     Conjunctiva/sclera: Conjunctivae normal.  Cardiovascular:     Rate and Rhythm: Normal rate and regular rhythm.     Heart sounds: Normal heart sounds. No murmur heard.  No friction rub. No gallop.   Pulmonary:     Effort: Pulmonary effort is normal. No respiratory distress.     Breath sounds: Normal breath sounds. No wheezing or rales.  Chest:      Chest wall: No tenderness.  Abdominal:     General: Bowel sounds are normal.     Palpations: Abdomen is soft.     Tenderness: There is no abdominal tenderness.  Musculoskeletal:     Cervical back: Neck supple.  Lymphadenopathy:     Cervical: No cervical adenopathy.  Skin:    General: Skin is warm and dry.  Findings: No rash.  Neurological:     Mental Status: She is alert and oriented to person, place, and time.  Psychiatric:        Behavior: Behavior normal.        Thought Content: Thought content normal.        Judgment: Judgment normal.      Depression screen [facility] 2/9 05/04/2016 08/06/2014 03/21/2014 05/24/2013 12/14/2012  Decreased Interest 3 1 1 1  0  Down, Depressed, Hopeless 3 1 1 1  0  PHQ - 2 Score 6 2 2 2  0  Altered sleeping 2 1 1 2  -  Tired, decreased energy 2 1 1 2  -  Change in appetite 0 1 1 2  -  Feeling bad or failure about yourself  1 1 1 1  -  Trouble concentrating 0 1 0 1 -  Moving slowly or fidgety/restless 1 1 1 1  -  Suicidal thoughts 0 1 0 1 -  PHQ-9 Score 12 9 7 12  -  Difficult doing work/chores Very difficult - - - -  Some recent data might be hidden       Assessment & Plan:    Patient Active Problem List   Diagnosis Date Noted  . Housing problems 06/06/2020  . Abdominal wall bulge 04/22/2020  . Acute renal failure superimposed on stage 3b chronic kidney disease ([place])   . History of colonic polyps 07/06/2019  . History of partial colectomy 07/06/2019  . Seasonal allergies 03/27/2019  . Dysuria 03/27/2019  . Obesity (BMI 30-39.9) 11/12/2018  . Constipation due to opioid therapy 11/12/2018  . Lactic acidosis 11/12/2018  . Large bowel perforation ([place]) 11/12/2018  . Peritonitis ([place]) 11/12/2018  . Stercoral ulcer of large intestine 11/12/2018  . Pneumoperitoneum   . COPD (chronic obstructive pulmonary disease) ([place]) 12/17/2017  . Hypothyroidism 12/17/2017  . Steroid-induced psychosis, with hallucinations ([place])   . UTI (urinary tract infection)  03/03/2017  . Nausea 03/03/2017  . Weight loss 03/03/2017  . CAP (community acquired pneumonia) 07/22/2016  . CKD (chronic kidney disease) stage 3, GFR 30-59 ml/min 05/04/2016  . Elevated LFTs 05/04/2016  . 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 Breath shortness 06/19/2014  . Syncope 05/31/2014  . Rib fracture 05/31/2014  . Weight gain 03/21/2014  . Cervical spondylosis 08/15/2013  . Neck pain on left side 05/24/2013  . Unspecified vitamin D deficiency 12/14/2012  . Vitamin D toxicity 12/14/2012  . Hemorrhoid 09/01/2012  . Myalgia 02/25/2012  . HTN (hypertension) 11/25/2011  . Cramps, muscle, general 08/18/2011  . RECTAL BLEEDING 11/03/2010  . DYSPHAGIA 11/03/2010  . FATIGUE 08/29/2010  . MUSCLE PAIN 06/04/2010  . Cystitis 05/12/2010  . RENAL INSUFFICIENCY 12/18/2008  . DEPRESSION 09/04/2008  . IRRITABLE BOWEL SYNDROME 05/22/2008  . HERNIATED CERVICAL DISC 10/05/2007  . HERNIATED LUMBAR DISC 10/05/2007  . History of urinary tract infection 09/20/2007  . HIV-1 associated autonomic neuropathy ([place]) 03/16/2007  . ELBOW PAIN 03/16/2007  . ISCHEMIC COLITIS 12/27/2006  . RENAL FAILURE NOS 12/27/2006  . HIV infection ([place]) 08/27/2006     Problem List Items Addressed This Visit      Other   HIV infection ([place]) - Primary    [name] is a 79 year old female with well-controlled HIV disease with good adherence and tolerance to her ART regimen of Triumeq.  No signs/symptoms of opportunistic infection or new progressive HIV disease.  Reviewed previous lab work and discussed the plan of care.  Continue current dose of Triumeq.  Check blood work today.  Plan for follow-up  in 6 months or sooner if needed.      Relevant Medications   abacavir-dolutegravir-lamiVUDine (TRIUMEQ) 600-50-300 MG tablet   Other Relevant Orders   COMPLETE METABOLIC PANEL WITH GFR   HIV-1 RNA quant-no reflex-bld   T-helper cell (CD4)- ([facility] only)   CBC   Housing problems    Ms. Mcloughlin has been having  housing difficulties with her roommate who has psychiatric/mental health conditions.  Severity of her problems enough that she would like to move out of her house/apartment.  Does not appear to be in any present danger as she is here with her roommate today.  Resources provided to assist in finding new housing.  Advised to seek further assistance from law enforcement and/or legal counsel if necessary.  No further assistance needed from health care at this time.       Other Visit Diagnoses    Screening for STDs (sexually transmitted diseases)       Relevant Orders   RPR   Pharmacologic therapy       Relevant Orders   Lipid panel       I am having [name]. Imperato maintain her thiamine, ondansetron, Vitamin D3, diphenhydrAMINE, polyvinyl alcohol, albuterol, ipratropium-albuterol, Multiple Vitamin (ONE-A-DAY 55 PLUS PO), LORazepam, docusate sodium, magnesium, fluticasone, solifenacin, fluocinonide-emollient, Fish Oil Burp-Less, Oxycodone HCl, furosemide, amitriptyline, nebivolol, cephALEXin, amoxicillin-clavulanate, escitalopram, and Triumeq.   Meds ordered this encounter  Medications  . abacavir-dolutegravir-lamiVUDine (TRIUMEQ) 600-50-300 MG tablet    Sig: Take 1 tablet by mouth daily.    Dispense:  30 tablet    Refill:  5    Order Specific Question:   Supervising Provider    Answer:   Carlyle Basques [[id number]]     Follow-up: Return in about 6 months (around 12/07/2020).   Terri Piedra, MSN, FNP-C Nurse Practitioner [facility] [place] number: [phone]

## 2020-06-06 NOTE — Assessment & Plan Note (Signed)
[name] is a 79 year old female with well-controlled HIV disease with good adherence and tolerance to her ART regimen of Triumeq.  No signs/symptoms of opportunistic infection or new progressive HIV disease.  Reviewed previous lab work and discussed the plan of care.  Continue current dose of Triumeq.  Check blood work today.  Plan for follow-up in 6 months or sooner if needed.

## 2020-06-07 LAB — T-HELPER CELL (CD4) - (RCID CLINIC ONLY)
CD4 % Helper T Cell: 33 % (ref 33–65)
CD4 T Cell Abs: 773 /uL (ref 400–1790)

## 2020-06-07 NOTE — Telephone Encounter (Signed)
PA Approved. Auth# [id number] dates 07/15/2020-10/13/2020

## 2020-06-09 ENCOUNTER — Telehealth: Payer: Self-pay | Admitting: Gastroenterology

## 2020-06-09 LAB — COMPLETE METABOLIC PANEL WITH GFR
AG Ratio: 2.1 (calc) (ref 1.0–2.5)
ALT: 16 U/L (ref 6–29)
AST: 18 U/L (ref 10–35)
Albumin: 4.4 g/dL (ref 3.6–5.1)
Alkaline phosphatase (APISO): 58 U/L (ref 37–153)
BUN/Creatinine Ratio: 18 (calc) (ref 6–22)
BUN: 27 mg/dL — ABNORMAL HIGH (ref 7–25)
CO2: 26 mmol/L (ref 20–32)
Calcium: 10.6 mg/dL — ABNORMAL HIGH (ref 8.6–10.4)
Chloride: 107 mmol/L (ref 98–110)
Creat: 1.54 mg/dL — ABNORMAL HIGH (ref 0.60–0.93)
GFR, Est African American: 37 mL/min/{1.73_m2} — ABNORMAL LOW (ref >=60)
GFR, Est Non African American: 32 mL/min/{1.73_m2} — ABNORMAL LOW (ref >=60)
Globulin: 2.1 g/dL (calc) (ref 1.9–3.7)
Glucose, Bld: 95 mg/dL (ref 65–99)
Potassium: 4.7 mmol/L (ref 3.5–5.3)
Sodium: 139 mmol/L (ref 135–146)
Total Bilirubin: 0.5 mg/dL (ref 0.2–1.2)
Total Protein: 6.5 g/dL (ref 6.1–8.1)

## 2020-06-09 LAB — CBC
HCT: 41 % (ref 35.0–45.0)
Hemoglobin: 14 g/dL (ref 11.7–15.5)
MCH: 34.6 pg — ABNORMAL HIGH (ref 27.0–33.0)
MCHC: 34.1 g/dL (ref 32.0–36.0)
MCV: 101.2 fL — ABNORMAL HIGH (ref 80.0–100.0)
MPV: 10.2 fL (ref 7.5–12.5)
Platelets: 164 10*3/uL (ref 140–400)
RBC: 4.05 10*6/uL (ref 3.80–5.10)
RDW: 12.5 % (ref 11.0–15.0)
WBC: 7.1 10*3/uL (ref 3.8–10.8)

## 2020-06-09 LAB — LIPID PANEL
Cholesterol: 200 mg/dL — ABNORMAL HIGH (ref <200)
HDL: 35 mg/dL — ABNORMAL LOW (ref >=50)
LDL Cholesterol (Calc): 129 mg/dL (calc) — ABNORMAL HIGH
Non-HDL Cholesterol (Calc): 165 mg/dL (calc) — ABNORMAL HIGH (ref <130)
Total CHOL/HDL Ratio: 5.7 (calc) — ABNORMAL HIGH (ref <5.0)
Triglycerides: 218 mg/dL — ABNORMAL HIGH (ref <150)

## 2020-06-09 LAB — HIV-1 RNA QUANT-NO REFLEX-BLD
HIV 1 RNA Quant: <20 Copies/mL
HIV-1 RNA Quant, Log: <1.3 Log cps/mL

## 2020-06-09 LAB — RPR: RPR Ser Ql: NONREACTIVE

## 2020-06-09 NOTE — Telephone Encounter (Signed)
Patient was seen in the ED on 8/3 with diarrhea x3-4 days. No significant findings on labs. Hemoglobin wnl. Per ED note, patient was heme negative multiple times. Ostomy output with pink/red food particles. CT without acute findings. Likely an acute gastroenteritis as she reported her roommate had also been sick. C. Diff was negative. She was found to have a UTI and was prescribed antibiotics.   Elmo Putt, can you please call patient to see if her diarrhea has resolved?

## 2020-06-10 LAB — POC OCCULT BLOOD, ED: Fecal Occult Bld: NEGATIVE

## 2020-06-10 NOTE — Telephone Encounter (Signed)
Lmom, waiting on a return call.  

## 2020-06-14 ENCOUNTER — Telehealth: Payer: Self-pay | Admitting: Internal Medicine

## 2020-06-14 NOTE — Telephone Encounter (Signed)
Pt returned call. Pt says her diarrhea has resolved and she isn't having any problems currently.

## 2020-06-14 NOTE — Telephone Encounter (Signed)
Pt returning call. [phone]

## 2020-06-14 NOTE — Telephone Encounter (Signed)
Noted. No further recommendations.  

## 2020-06-14 NOTE — Telephone Encounter (Signed)
See other phone note for documentation.  

## 2020-07-08 ENCOUNTER — Telehealth: Payer: Self-pay | Admitting: Internal Medicine

## 2020-07-08 NOTE — Telephone Encounter (Signed)
Lmom, waiting on a return call.  

## 2020-07-08 NOTE — Telephone Encounter (Signed)
Patient called. She states she has been having diarrhea for a few days. She is on an antibiotic for sinus right now. It's called doxycyline 100mg  twice a day. She will finish tomorrow. She also states the area of her colostomy bag is tender.

## 2020-07-08 NOTE — Telephone Encounter (Signed)
Pt called to confirm her procedure date and has questions about her colostomy bag. Please call [phone]

## 2020-07-11 NOTE — Telephone Encounter (Signed)
Lmom, waiting on a return call.  

## 2020-07-12 ENCOUNTER — Other Ambulatory Visit: Payer: Self-pay

## 2020-07-12 ENCOUNTER — Other Ambulatory Visit (HOSPITAL_COMMUNITY)
Admission: RE | Admit: 2020-07-12 | Discharge: 2020-07-12 | Disposition: A | Discharge status: Home / Self Care | Payer: Medicare Other | Source: Ambulatory Visit | Attending: Internal Medicine | Admitting: Internal Medicine

## 2020-07-12 ENCOUNTER — Encounter (HOSPITAL_COMMUNITY)
Admission: RE | Admit: 2020-07-12 | Discharge: 2020-07-12 | Disposition: A | Discharge status: Home / Self Care | Payer: Medicare Other | Source: Ambulatory Visit | Attending: Internal Medicine | Admitting: Internal Medicine

## 2020-07-12 DIAGNOSIS — Z20822 Contact with and (suspected) exposure to covid-19: Secondary | ICD-10-CM | POA: Diagnosis not present

## 2020-07-12 DIAGNOSIS — Z01812 Encounter for preprocedural laboratory examination: Secondary | ICD-10-CM | POA: Diagnosis not present

## 2020-07-12 LAB — SARS CORONAVIRUS 2 (TAT 6-24 HRS): SARS Coronavirus 2: NEGATIVE

## 2020-07-12 NOTE — Patient Instructions (Signed)
[name]  07/12/2020     @PREFPERIOPPHARMACY @   Your procedure is scheduled on Monday, 07/15/20.  Report to [facility] at 6:45 A.M.  Call this number if you have problems the morning of surgery:  [phone]   Remember:  Do not eat or drink after midnight.     Take these medicines the morning of surgery with A SIP OF WATER lexapro, ativan if needed, Bystolic, zofran, oxycodone if needed and use inhaler and bring it with you.    Do not wear jewelry, make-up or nail polish.  Do not wear lotions, powders, or perfumes, or deodorant.  Do not shave 48 hours prior to surgery.  Men may shave face and neck.  Do not bring valuables to the hospital.  [facility] is not responsible for any belongings or valuables.  Contacts, dentures or bridgework may not be worn into surgery.  Leave your suitcase in the car.  After surgery it may be brought to your room.  For patients admitted to the hospital, discharge time will be determined by your treatment team.  Patients discharged the day of surgery will not be allowed to drive home.   Name and phone number of your driver:   family Special instructions:  Please follow diet and prep instructions given to you by Dr. Roseanne Kaufman office.  Please read over the following fact sheets that you were given. Anesthesia Post-op Instructions and Care and Recovery After Surgery    Colonoscopy, Adult A colonoscopy is a procedure to look at the entire large intestine. This procedure is done using a long, thin, flexible tube that has a camera on the end. You may have a colonoscopy:  As a part of normal colorectal screening.  If you have certain symptoms, such as: ? A low number of red blood cells in your blood (anemia). ? Diarrhea that does not go away. ? Pain in your abdomen. ? Blood in your stool. A colonoscopy can help screen for and diagnose medical problems, including:  Tumors.  Extra tissue that grows where mucus forms  (polyps).  Inflammation.  Areas of bleeding. Tell your health care provider about:  Any allergies you have.  All medicines you are taking, including vitamins, herbs, eye drops, creams, and over-the-counter medicines.  Any problems you or family members have had with anesthetic medicines.  Any blood disorders you have.  Any surgeries you have had.  Any medical conditions you have.  Any problems you have had with having bowel movements.  Whether you are pregnant or may be pregnant. What are the risks? Generally, this is a safe procedure. However, problems may occur, including:  Bleeding.  Damage to your intestine.  Allergic reactions to medicines given during the procedure.  Infection. This is rare. What happens before the procedure? Eating and drinking restrictions Follow instructions from your health care provider about eating or drinking restrictions, which may include:  A few days before the procedure: ? Follow a low-fiber diet. ? Avoid nuts, seeds, dried fruit, raw fruits, and vegetables.  1-3 days before the procedure: ? Eat only gelatin dessert or ice pops. ? Drink only clear liquids, such as water, clear juice, clear broth or bouillon, black coffee or tea, or clear soft drinks or sports drinks. ? Avoid liquids that contain red or purple dye.  The day of the procedure: ? Do not eat solid foods. You may continue to drink clear liquids until up to 2 hours before the procedure. ? Do not eat or  drink anything starting 2 hours before the procedure, or within the time period that your health care provider recommends. Bowel prep If you were prescribed a bowel prep to take by mouth (orally) to clean out your colon:  Take it as told by your health care provider. Starting the day before your procedure, you will need to drink a large amount of liquid medicine. The liquid will cause you to have many bowel movements of loose stool until your stool becomes almost clear or  light green.  If your skin or the opening between the buttocks (anus) gets irritated from diarrhea, you may relieve the irritation using: ? Wipes with medicine in them, such as adult wet wipes with aloe and vitamin E. ? A product to soothe skin, such as petroleum jelly.  If you vomit while drinking the bowel prep: ? Take a break for up to 60 minutes. ? Begin the bowel prep again. ? Call your health care provider if you keep vomiting or you cannot take the bowel prep without vomiting.  To clean out your colon, you may also be given: ? Laxative medicines. These help you have a bowel movement. ? Instructions for enema use. An enema is liquid medicine injected into your rectum. Medicines Ask your health care provider about:  Changing or stopping your regular medicines or supplements. This is especially important if you are taking iron supplements, diabetes medicines, or blood thinners.  Taking medicines such as aspirin and ibuprofen. These medicines can thin your blood. Do not take these medicines unless your health care provider tells you to take them.  Taking over-the-counter medicines, vitamins, herbs, and supplements. General instructions  Ask your health care provider what steps will be taken to help prevent infection. These may include washing skin with a germ-killing soap.  Plan to have someone take you home from the hospital or clinic. What happens during the procedure?   An IV will be inserted into one of your veins.  You may be given one or more of the following: ? A medicine to help you relax (sedative). ? A medicine to numb the area (local anesthetic). ? A medicine to make you fall asleep (general anesthetic). This is rarely needed.  You will lie on your side with your knees bent.  The tube will: ? Have oil or gel put on it (be lubricated). ? Be inserted into your anus. ? Be gently eased through all parts of your large intestine.  Air will be sent into your colon  to keep it open. This may cause some pressure or cramping.  Images will be taken with the camera and will appear on a screen.  A small tissue sample may be removed to be looked at under a microscope (biopsy). The tissue may be sent to a lab for testing if any signs of problems are found.  If small polyps are found, they may be removed and checked for cancer cells.  When the procedure is finished, the tube will be removed. The procedure may vary among health care providers and hospitals. What happens after the procedure?  Your blood pressure, heart rate, breathing rate, and blood oxygen level will be monitored until you leave the hospital or clinic.  You may have a small amount of blood in your stool.  You may pass gas and have mild cramping or bloating in your abdomen. This is caused by the air that was used to open your colon during the exam.  Do not drive for 24 hours  after the procedure.  It is up to you to get the results of your procedure. Ask your health care provider, or the department that is doing the procedure, when your results will be ready. Summary  A colonoscopy is a procedure to look at the entire large intestine.  Follow instructions from your health care provider about eating and drinking before the procedure.  If you were prescribed an oral bowel prep to clean out your colon, take it as told by your health care provider.  During the colonoscopy, a flexible tube with a camera on its end is inserted into the anus and then passed into the other parts of the large intestine. This information is not intended to replace advice given to you by your health care provider. Make sure you discuss any questions you have with your health care provider. Document Revised: 05/05/2019 Document Reviewed: 05/05/2019 Elsevier Patient Education  [place].

## 2020-07-15 ENCOUNTER — Encounter (HOSPITAL_COMMUNITY): Payer: Self-pay | Admitting: Internal Medicine

## 2020-07-15 ENCOUNTER — Encounter (HOSPITAL_COMMUNITY)
Admission: RE | Disposition: A | Discharge status: Home / Self Care | Payer: Self-pay | Source: Home / Self Care | Attending: Internal Medicine

## 2020-07-15 ENCOUNTER — Ambulatory Visit (HOSPITAL_COMMUNITY): Payer: Medicare Other | Admitting: Anesthesiology

## 2020-07-15 ENCOUNTER — Other Ambulatory Visit: Payer: Self-pay

## 2020-07-15 ENCOUNTER — Ambulatory Visit (HOSPITAL_COMMUNITY)
Admission: RE | Admit: 2020-07-15 | Discharge: 2020-07-15 | Disposition: A | Discharge status: Home / Self Care | Payer: Medicare Other | Attending: Internal Medicine | Admitting: Internal Medicine

## 2020-07-15 DIAGNOSIS — M1990 Unspecified osteoarthritis, unspecified site: Secondary | ICD-10-CM | POA: Insufficient documentation

## 2020-07-15 DIAGNOSIS — Z933 Colostomy status: Secondary | ICD-10-CM | POA: Diagnosis not present

## 2020-07-15 DIAGNOSIS — Z86010 Personal history of colonic polyps: Secondary | ICD-10-CM | POA: Insufficient documentation

## 2020-07-15 DIAGNOSIS — K2100 Gastro-esophageal reflux disease with esophagitis, without bleeding: Secondary | ICD-10-CM | POA: Insufficient documentation

## 2020-07-15 DIAGNOSIS — I6930 Unspecified sequelae of cerebral infarction: Secondary | ICD-10-CM | POA: Diagnosis not present

## 2020-07-15 DIAGNOSIS — R1310 Dysphagia, unspecified: Secondary | ICD-10-CM | POA: Diagnosis present

## 2020-07-15 DIAGNOSIS — Z1211 Encounter for screening for malignant neoplasm of colon: Secondary | ICD-10-CM | POA: Diagnosis not present

## 2020-07-15 DIAGNOSIS — N183 Chronic kidney disease, stage 3 unspecified: Secondary | ICD-10-CM | POA: Insufficient documentation

## 2020-07-15 DIAGNOSIS — Z21 Asymptomatic human immunodeficiency virus [HIV] infection status: Secondary | ICD-10-CM | POA: Diagnosis not present

## 2020-07-15 DIAGNOSIS — Z885 Allergy status to narcotic agent status: Secondary | ICD-10-CM | POA: Diagnosis not present

## 2020-07-15 DIAGNOSIS — Z888 Allergy status to other drugs, medicaments and biological substances status: Secondary | ICD-10-CM | POA: Insufficient documentation

## 2020-07-15 DIAGNOSIS — J449 Chronic obstructive pulmonary disease, unspecified: Secondary | ICD-10-CM | POA: Diagnosis not present

## 2020-07-15 DIAGNOSIS — I129 Hypertensive chronic kidney disease with stage 1 through stage 4 chronic kidney disease, or unspecified chronic kidney disease: Secondary | ICD-10-CM | POA: Insufficient documentation

## 2020-07-15 DIAGNOSIS — Z79899 Other long term (current) drug therapy: Secondary | ICD-10-CM | POA: Diagnosis not present

## 2020-07-15 DIAGNOSIS — K2950 Unspecified chronic gastritis without bleeding: Secondary | ICD-10-CM | POA: Insufficient documentation

## 2020-07-15 DIAGNOSIS — G629 Polyneuropathy, unspecified: Secondary | ICD-10-CM | POA: Insufficient documentation

## 2020-07-15 DIAGNOSIS — Z792 Long term (current) use of antibiotics: Secondary | ICD-10-CM | POA: Insufficient documentation

## 2020-07-15 DIAGNOSIS — D120 Benign neoplasm of cecum: Secondary | ICD-10-CM | POA: Insufficient documentation

## 2020-07-15 DIAGNOSIS — K635 Polyp of colon: Secondary | ICD-10-CM

## 2020-07-15 DIAGNOSIS — F329 Major depressive disorder, single episode, unspecified: Secondary | ICD-10-CM | POA: Diagnosis not present

## 2020-07-15 DIAGNOSIS — K5730 Diverticulosis of large intestine without perforation or abscess without bleeding: Secondary | ICD-10-CM | POA: Diagnosis not present

## 2020-07-15 HISTORY — PX: ESOPHAGOGASTRODUODENOSCOPY (EGD) WITH PROPOFOL: SHX5813

## 2020-07-15 HISTORY — PX: FLEXIBLE SIGMOIDOSCOPY: SHX5431

## 2020-07-15 HISTORY — PX: COLONOSCOPY WITH PROPOFOL: SHX5780

## 2020-07-15 HISTORY — PX: MALONEY DILATION: SHX5535

## 2020-07-15 HISTORY — PX: POLYPECTOMY: SHX5525

## 2020-07-15 HISTORY — PX: BIOPSY: SHX5522

## 2020-07-15 SURGERY — COLONOSCOPY WITH PROPOFOL
Anesthesia: General

## 2020-07-15 MED ORDER — LIDOCAINE VISCOUS HCL 2 % MT SOLN
OROMUCOSAL | Status: AC
  Filled 2020-07-15: qty 15

## 2020-07-15 MED ORDER — LACTATED RINGERS IV SOLN
Freq: Once | INTRAVENOUS | Status: AC
  Administered 2020-07-15: 1000 mL via INTRAVENOUS

## 2020-07-15 MED ORDER — PROPOFOL 500 MG/50ML IV EMUL
INTRAVENOUS | Status: DC | PRN
  Administered 2020-07-15: 150 ug/kg/min via INTRAVENOUS

## 2020-07-15 MED ORDER — GLYCOPYRROLATE PF 0.2 MG/ML IJ SOSY
PREFILLED_SYRINGE | INTRAMUSCULAR | Status: AC
  Filled 2020-07-15: qty 1

## 2020-07-15 MED ORDER — LIDOCAINE VISCOUS HCL 2 % MT SOLN
15.0000 mL | Freq: Once | OROMUCOSAL | Status: AC
  Administered 2020-07-15: 15 mL via OROMUCOSAL

## 2020-07-15 MED ORDER — PROPOFOL 10 MG/ML IV BOLUS
INTRAVENOUS | Status: DC | PRN
  Administered 2020-07-15: 20 mg via INTRAVENOUS
  Administered 2020-07-15: 40 mg via INTRAVENOUS
  Administered 2020-07-15: 20 mg via INTRAVENOUS

## 2020-07-15 MED ORDER — GLYCOPYRROLATE 0.2 MG/ML IJ SOLN
INTRAMUSCULAR | Status: DC | PRN
  Administered 2020-07-15: .2 mg via INTRAVENOUS

## 2020-07-15 MED ORDER — EPHEDRINE SULFATE 50 MG/ML IJ SOLN
INTRAMUSCULAR | Status: DC | PRN
  Administered 2020-07-15 (×3): 10 mg via INTRAVENOUS

## 2020-07-15 MED ORDER — CHLORHEXIDINE GLUCONATE CLOTH 2 % EX PADS: 6.0000 | MEDICATED_PAD | Freq: Once | CUTANEOUS | Status: DC

## 2020-07-15 MED ORDER — PROPOFOL 10 MG/ML IV BOLUS
INTRAVENOUS | Status: AC
  Filled 2020-07-15: qty 60

## 2020-07-15 MED ORDER — FENTANYL CITRATE (PF) 100 MCG/2ML IJ SOLN
INTRAMUSCULAR | Status: AC
  Filled 2020-07-15: qty 2

## 2020-07-15 MED ORDER — STERILE WATER FOR IRRIGATION IR SOLN
Status: DC | PRN
  Administered 2020-07-15: 1.5 mL

## 2020-07-15 MED ORDER — LACTATED RINGERS IV SOLN: INTRAVENOUS | Status: DC | PRN

## 2020-07-15 NOTE — H&P (Signed)
@LOGO @   Primary Care Physician:  Wannetta Sender, FNP Primary Gastroenterologist:  Dr. Gala Romney  Pre-Procedure History & Physical: HPI:  [name] is a 79 y.o. female here for evaluation of dysphagia via EGD and History of tubular adenoma.  History of diverting colostomy for stercoral ulceration with perforation.  Plans now being made to takedown reanastomose.  Surveillance colonoscopy needed prior to surgery. Past Medical History:  Diagnosis Date  . Anxiety   . Arthritis   . Bronchitis   . CKD (chronic kidney disease) stage 3, GFR 30-59 ml/min 05/04/2016  . COPD (chronic obstructive pulmonary disease) ([place])   . Depression   . Dysuria 03/27/2019  . Elevated LFTs 05/04/2016  . History of TIAs 1981   left side weakness  . HIV (human immunodeficiency virus infection) ([place])   . Hypertension   . Kidney disease related to HIV infection [facility])    pt states she has to have her creatinine level checked often   . Nausea 03/03/2017  . Peripheral neuropathy    bilateral feet  . Seasonal allergies 03/27/2019  . Sinusitis 05/06/2018  . UTI (urinary tract infection) 03/03/2017  . Weight loss 03/03/2017    Past Surgical History:  Procedure Laterality Date  . ABDOMINAL HYSTERECTOMY  1985  . BACK SURGERY    . CATARACT EXTRACTION, BILATERAL    . CHOLECYSTECTOMY    . COLONOSCOPY  08/10/2002   NUR: Normal colonoscopy except for some hemorrhoids and some erythema at the dentate line  . COLONOSCOPY  12/10/2006   Edwards:Diffuse colitis in the transverse and descending colon/This is not entirely typical of ischemic colitis or her HIV positive/ disease.  We need to be concerned about other causes  . COLONOSCOPY  01/19/2011   RMR: Internal and external hemorrhoids, likely source of hematochezia anal papilla, otherwise normal rectal mucosa/ Left-sided diverticula.  Cecal polyp with status post cold snare polypectomy.  Remainder of colonic mucosa appeared normal. Pathology with tubular adenoma.  Repeat in 2017  . COLOSTOMY N/A 11/12/2018   Procedure: COLOSTOMY;  Surgeon: Virl Cagey, MD;  Location: [facility];  Service: General;  Laterality: N/A;  . ESOPHAGOGASTRODUODENOSCOPY  08/10/2002     NUR: Mild changes of reflux esophagitis limited to gastroesophageal junction  No evidence of ring, stricture, or esophageal candidiasis dysphagia/ Gastritis, possibly H. pylori induced  . ESOPHAGOGASTRODUODENOSCOPY  12/25/2002   NUR: Erosive antral gastritis.  The degree of gastritis is more significant than her last exam/ Normal examination of the esophagus/ Esophageal dilatation performed by passing 34 and [address] [place]  . ESOPHAGOGASTRODUODENOSCOPY  11/13/2010   RMR: Circumferential distal esophageal erosions with soft peptic stricture, consistent with erosive reflux esophagitis or stricture  formation, status post dilation as described above/  Small hiatal hernia/ Tiny antral erosions, otherwise normal stomach D1 and D2  . HEMORRHOID SURGERY  09/02/2012   Procedure: HEMORRHOIDECTOMY;  Surgeon: Jamesetta So, MD;  Location: [facility];  Service: General;  Laterality: N/A;  . LAPAROTOMY N/A 11/12/2018   Procedure: EXPLORATORY LAPAROTOMY;  Surgeon: Virl Cagey, MD;  Location: [facility];  Service: General;  Laterality: N/A;  . PARTIAL COLECTOMY N/A 11/12/2018   Procedure: PARTIAL COLECTOMY;  Surgeon: Virl Cagey, MD;  Location: [facility];  Service: General;  Laterality: N/A;  . TUBAL LIGATION      Prior to Admission medications   Medication Sig Start Date End Date Taking? Authorizing Provider  abacavir-dolutegravir-lamiVUDine (TRIUMEQ) 600-50-300 MG tablet Take 1 tablet by mouth daily. 06/06/20  Yes Golden Circle, FNP  albuterol (PROVENTIL HFA;VENTOLIN HFA) 108 (90 Base) MCG/ACT inhaler Inhale 1-2 puffs into the lungs every 6 (six) hours as needed for wheezing or shortness of breath. 04/02/17  Yes Nat Christen, MD  amitriptyline (ELAVIL) 50 MG tablet Take 1 tablet (50 mg total) by mouth at  bedtime. 12/31/19  Yes Barton Dubois, MD  amoxicillin-clavulanate (AUGMENTIN) 875-125 MG tablet Take 1 tablet by mouth 2 (two) times daily. One po bid x 7 days 05/28/20  Yes Daleen Bo, MD  cephALEXin (KEFLEX) 500 MG capsule Take 1 capsule (500 mg total) by mouth 3 (three) times daily. 05/28/20  Yes Rancour, Annie Main, MD  Cholecalciferol (VITAMIN D3) 10000 units TABS Take 1 tablet by mouth daily.   Yes [provider]  diphenhydrAMINE (BENADRYL) 25 mg capsule Take 25 mg by mouth daily as needed for itching (itching from codeine).    Yes [provider]  docusate sodium (COLACE) 100 MG capsule Take 1 capsule (100 mg total) by mouth 2 (two) times daily. 11/21/18  Yes Virl Cagey, MD  escitalopram (LEXAPRO) 10 MG tablet Take 10 mg by mouth daily.   Yes [provider]  fluticasone (FLONASE) 50 MCG/ACT nasal spray 2 SPRAYS INTO BOTH NOSTRILS AT BEDTIME. Patient taking differently: Place 2 sprays into both nostrils at bedtime.  05/22/19  Yes [name], Lavell Islam, MD  furosemide (LASIX) 40 MG tablet Take 1 tablet (40 mg total) by mouth daily as needed for fluid. Hold until follow up with PCP 12/31/19  Yes Barton Dubois, MD  ipratropium-albuterol (DUONEB) 0.5-2.5 (3) MG/3ML SOLN Inhale 3 mLs into the lungs every 6 (six) hours as needed (shortness of breath).  04/02/17  Yes [provider]  LORazepam (ATIVAN) 0.5 MG tablet Take 0.5 mg by mouth 2 (two) times daily.  11/01/18  Yes [provider]  magnesium 30 MG tablet Take 30 mg by mouth at bedtime.    Yes [provider]  Multiple Vitamin (ONE-A-DAY 55 PLUS PO) Take 1 tablet by mouth daily.   Yes [provider]  nebivolol (BYSTOLIC) 5 MG tablet Take 1 tablet (5 mg total) by mouth daily. 02/27/20  Yes Branch, Alphonse Guild, MD  Omega-3 Fatty Acids (FISH OIL BURP-LESS) 1000 MG CAPS Take 1 capsule by mouth daily.   Yes [provider]  ondansetron (ZOFRAN) 8 MG tablet Take 8 mg by mouth daily as  needed for nausea or vomiting.    Yes [provider]  Oxycodone HCl 10 MG TABS Take 10 mg by mouth 2 (two) times daily as needed (for pain).   Yes [provider]  polyvinyl alcohol (LIQUIFILM TEARS) 1.4 % ophthalmic solution Place 1 drop into both eyes daily.    Yes [provider]  solifenacin (VESICARE) 10 MG tablet Take 1 tablet (10 mg total) by mouth daily. 05/23/19  Yes Florian Buff, MD  thiamine (VITAMIN B-1) 100 MG tablet Take 100 mg by mouth daily.   Yes [provider]  fluocinonide-emollient (LIDEX-E) 0.05 % cream Apply 1 application topically 2 (two) times daily. Patient not taking: Reported on 06/06/2020 05/23/19   Florian Buff, MD    Allergies as of 06/03/2020 - Review Complete 05/28/2020  Allergen Reaction Noted  . Bee venom Anaphylaxis 08/27/2012  . Promethazine Swelling 06/18/2014  . Tenofovir disoproxil fumarate [tenofovir disoproxil fumarate] Other (See Comments) 03/05/2011  . Compazine  [prochlorperazine edisylate] Nausea And Vomiting 06/18/2014  . Haloperidol lactate Other (See Comments)   .  Brompheniramine-acetaminophen  06/10/2017  . Chlorcyclizine  06/10/2017  . Chlorpromazine  06/10/2017  . Codeine Itching and Nausea And Vomiting   . Navane [thiothixene] Other (See Comments) and Nausea And Vomiting   . Prednisone  04/01/2017  . Promethazine hcl Other (See Comments)   . Propoxyphene n-acetaminophen Itching and Nausea And Vomiting   . Morphine Itching and Swelling 08/06/2014    Family History  Problem Relation Age of Onset  . Diabetes Mother   . Diabetes Brother   . Colon cancer Brother        Diagnosed > age 55    Social History   Socioeconomic History  . Marital status: Widowed    Spouse name: Not on file  . Number of children: 3  . Years of education: Not on file  . Highest education level: Not on file  Occupational History  . Occupation: retired    Fish farm manager: RETIRED  Tobacco Use  . Smoking status: Never  Smoker  . Smokeless tobacco: Never Used  Vaping Use  . Vaping Use: Never used  Substance and Sexual Activity  . Alcohol use: No  . Drug use: No  . Sexual activity: Not Currently    Comment: declined condoms  Other Topics Concern  . Not on file  Social History Narrative  . Not on file   Social Determinants of Health   Financial Resource Strain:   . Difficulty of Paying Living Expenses: Not on file  Food Insecurity:   . Worried About Charity fundraiser in the Last Year: Not on file  . Ran Out of Food in the Last Year: Not on file  Transportation Needs:   . Lack of Transportation (Medical): Not on file  . Lack of Transportation (Non-Medical): Not on file  Physical Activity:   . Days of Exercise per Week: Not on file  . Minutes of Exercise per Session: Not on file  Stress:   . Feeling of Stress : Not on file  Social Connections:   . Frequency of Communication with Friends and Family: Not on file  . Frequency of Social Gatherings with Friends and Family: Not on file  . Attends Religious Services: Not on file  . Active Member of Clubs or Organizations: Not on file  . Attends Archivist Meetings: Not on file  . Marital Status: Not on file  Intimate Partner Violence:   . Fear of Current or Ex-Partner: Not on file  . Emotionally Abused: Not on file  . Physically Abused: Not on file  . Sexually Abused: Not on file    Review of Systems: See HPI, otherwise negative ROS  Physical Exam: There were no vitals taken for this visit. General:   Alert,  Well-developed, well-nourished, pleasant and cooperative in NAD Neck:  Supple; no masses or thyromegaly. No significant cervical adenopathy. Lungs:  Clear throughout to auscultation.   No wheezes, crackles, or rhonchi. No acute distress. Heart:  Regular rate and rhythm; no murmurs, clicks, rubs,  or gallops. Abdomen: Non-distended, normal bowel sounds.  Soft and nontender without appreciable mass or hepatosplenomegaly.   Ostomy left lower quadrant Pulses:  Normal pulses noted. Extremities:  Without clubbing or edema.  Impression/Plan:  79 year old lady with esophageal dysphagia.  Here for EGD with esophageal dilation as feasible/appropriate history colonic adenoma.  History of diverting colostomy for stercoral ulceration with perforation.  Colonic adenoma.  Plans for takedown and reanastomosis.  Needs colonoscopy through ostomy and proctoscopy clear her lower GI tract prior to reanastomosis.  The risks, benefits, limitations, imponderables and alternatives regarding both EGD and colonoscopy have been reviewed with the patient. Questions have been answered. All parties agreeable.      Notice: This dictation was prepared with Dragon dictation along with smaller phrase technology. Any transcriptional errors that result from this process are unintentional and may not be corrected upon review.

## 2020-07-15 NOTE — Transfer of Care (Signed)
Immediate Anesthesia Transfer of Care Note  Patient: [name]  Procedure(s) Performed: COLONOSCOPY WITH PROPOFOL (N/A ) FLEXIBLE SIGMOIDOSCOPY (N/A ) ESOPHAGOGASTRODUODENOSCOPY (EGD) WITH PROPOFOL (N/A ) MALONEY DILATION (N/A ) BIOPSY POLYPECTOMY  Patient Location: PACU  Anesthesia Type:General  Level of Consciousness: awake  Airway & Oxygen Therapy: Patient Spontanous Breathing  Post-op Assessment: Report given to RN  Post vital signs: Reviewed  Last Vitals:  Vitals Value Taken Time  BP    Temp    Pulse    Resp 19 07/15/20 0941  SpO2    Vitals shown include unvalidated device data.  Last Pain:  Vitals:   07/15/20 0828  TempSrc:   PainSc: 3       Patients Stated Pain Goal: 7 (32/76/14 7092)  Complications: No complications documented.

## 2020-07-15 NOTE — Addendum Note (Signed)
Addendum  created 07/15/20 1029 by Ollen Bowl, CRNA   Clinical Note Signed

## 2020-07-15 NOTE — Discharge Instructions (Signed)
Colonoscopy Discharge Instructions  Read the instructions outlined below and refer to this sheet in the next few weeks. These discharge instructions provide you with general information on caring for yourself after you leave the hospital. Your doctor may also give you specific instructions. While your treatment has been planned according to the most current medical practices available, unavoidable complications occasionally occur. If you have any problems or questions after discharge, call Dr. Gala Romney at [phone]. ACTIVITY  You may resume your regular activity, but move at a slower pace for the next 24 hours.   Take frequent rest periods for the next 24 hours.   Walking will help get rid of the air and reduce the bloated feeling in your belly (abdomen).   No driving for 24 hours (because of the medicine (anesthesia) used during the test).    Do not sign any important legal documents or operate any machinery for 24 hours (because of the anesthesia used during the test).  NUTRITION  Drink plenty of fluids.   You may resume your normal diet as instructed by your doctor.   Begin with a light meal and progress to your normal diet. Heavy or fried foods are harder to digest and may make you feel sick to your stomach (nauseated).   Avoid alcoholic beverages for 24 hours or as instructed.  MEDICATIONS  You may resume your normal medications unless your doctor tells you otherwise.  WHAT YOU CAN EXPECT TODAY  Some feelings of bloating in the abdomen.   Passage of more gas than usual.   Spotting of blood in your stool or on the toilet paper.  IF YOU HAD POLYPS REMOVED DURING THE COLONOSCOPY:  No aspirin products for 7 days or as instructed.   No alcohol for 7 days or as instructed.   Eat a soft diet for the next 24 hours.  FINDING OUT THE RESULTS OF YOUR TEST Not all test results are available during your visit. If your test results are not back during the visit, make an appointment  with your caregiver to find out the results. Do not assume everything is normal if you have not heard from your caregiver or the medical facility. It is important for you to follow up on all of your test results.  SEEK IMMEDIATE MEDICAL ATTENTION IF:  You have more than a spotting of blood in your stool.   Your belly is swollen (abdominal distention).   You are nauseated or vomiting.   You have a temperature over 101.   You have abdominal pain or discomfort that is severe or gets worse throughout the day.  EGD Discharge instructions Please read the instructions outlined below and refer to this sheet in the next few weeks. These discharge instructions provide you with general information on caring for yourself after you leave the hospital. Your doctor may also give you specific instructions. While your treatment has been planned according to the most current medical practices available, unavoidable complications occasionally occur. If you have any problems or questions after discharge, please call your doctor. ACTIVITY  You may resume your regular activity but move at a slower pace for the next 24 hours.   Take frequent rest periods for the next 24 hours.   Walking will help expel (get rid of) the air and reduce the bloated feeling in your abdomen.   No driving for 24 hours (because of the anesthesia (medicine) used during the test).   You may shower.   Do not sign any important  legal documents or operate any machinery for 24 hours (because of the anesthesia used during the test).  NUTRITION  Drink plenty of fluids.   You may resume your normal diet.   Begin with a light meal and progress to your normal diet.   Avoid alcoholic beverages for 24 hours or as instructed by your caregiver.  MEDICATIONS  You may resume your normal medications unless your caregiver tells you otherwise.  WHAT YOU CAN EXPECT TODAY  You may experience abdominal discomfort such as a feeling of fullness  or "gas" pains.  FOLLOW-UP  Your doctor will discuss the results of your test with you.  SEEK IMMEDIATE MEDICAL ATTENTION IF ANY OF THE FOLLOWING OCCUR:  Excessive nausea (feeling sick to your stomach) and/or vomiting.   Severe abdominal pain and distention (swelling).   Trouble swallowing.   Temperature over 101 F (37.8 C).   Rectal bleeding or vomiting of blood.    You have acid reflux disease.  You need to be on medication daily for it.  We will need to check with your infectious disease doctor about an appropriate medication  Polyps removed from your colon.  Further recommendations to follow pending review of pathology report  At patient request, I spoke to Azalia Bilis at [phone] regarding impression and recommendations      Colon Polyps  Polyps are tissue growths inside the body. Polyps can grow in many places, including the large intestine (colon). A polyp may be a round bump or a mushroom-shaped growth. You could have one polyp or several. Most colon polyps are noncancerous (benign). However, some colon polyps can become cancerous over time. Finding and removing the polyps early can help prevent this. What are the causes? The exact cause of colon polyps is not known. What increases the risk? You are more likely to develop this condition if you:  Have a family history of colon cancer or colon polyps.  Are older than 14 or older than 45 if you are African American.  Have inflammatory bowel disease, such as ulcerative colitis or Crohn's disease.  Have certain hereditary conditions, such as: ? Familial adenomatous polyposis. ? Lynch syndrome. ? Turcot syndrome. ? Peutz-Jeghers syndrome.  Are overweight.  Smoke cigarettes.  Do not get enough exercise.  Drink too much alcohol.  Eat a diet that is high in fat and red meat and low in fiber.  Had childhood cancer that was treated with abdominal radiation. What are the signs or symptoms? Most polyps  do not cause symptoms. If you have symptoms, they may include:  Blood coming from your rectum when having a bowel movement.  Blood in your stool. The stool may look dark red or black.  Abdominal pain.  A change in bowel habits, such as constipation or diarrhea. How is this diagnosed? This condition is diagnosed with a colonoscopy. This is a procedure in which a lighted, flexible scope is inserted into the anus and then passed into the colon to examine the area. Polyps are sometimes found when a colonoscopy is done as part of routine cancer screening tests. How is this treated? Treatment for this condition involves removing any polyps that are found. Most polyps can be removed during a colonoscopy. Those polyps will then be tested for cancer. Additional treatment may be needed depending on the results of testing. Follow these instructions at home: Lifestyle  Maintain a healthy weight, or lose weight if recommended by your health care provider.  Exercise every day or as told  by your health care provider.  Do not use any products that contain nicotine or tobacco, such as cigarettes and e-cigarettes. If you need help quitting, ask your health care provider.  If you drink alcohol, limit how much you have: ? 0-1 drink a day for women. ? 0-2 drinks a day for men.  Be aware of how much alcohol is in your drink. In the [place] one drink equals one 12 oz bottle of beer (355 mL), one 5 oz glass of wine (148 mL), or one 1 oz shot of hard liquor (44 mL). Eating and drinking   Eat foods that are high in fiber, such as fruits, vegetables, and whole grains.  Eat foods that are high in calcium and vitamin D, such as milk, cheese, yogurt, eggs, liver, fish, and broccoli.  Limit foods that are high in fat, such as fried foods and desserts.  Limit the amount of red meat and processed meat you eat, such as hot dogs, sausage, bacon, and lunch meats. General instructions  Keep all follow-up visits as  told by your health care provider. This is important. ? This includes having regularly scheduled colonoscopies. ? Talk to your health care provider about when you need a colonoscopy. Contact a health care provider if:  You have new or worsening bleeding during a bowel movement.  You have new or increased blood in your stool.  You have a change in bowel habits.  You lose weight for no known reason. Summary  Polyps are tissue growths inside the body. Polyps can grow in many places, including the colon.  Most colon polyps are noncancerous (benign), but some can become cancerous over time.  This condition is diagnosed with a colonoscopy.  Treatment for this condition involves removing any polyps that are found. Most polyps can be removed during a colonoscopy. This information is not intended to replace advice given to you by your health care provider. Make sure you discuss any questions you have with your health care provider. Document Revised: 01/27/2018 Document Reviewed: 01/27/2018 Elsevier Patient Education  [place] After These instructions provide you with information about caring for yourself after your procedure. Your health care provider may also give you more specific instructions. Your treatment has been planned according to current medical practices, but problems sometimes occur. Call your health care provider if you have any problems or questions after your procedure. What can I expect after the procedure? After your procedure, you may:  Feel sleepy for several hours.  Feel clumsy and have poor balance for several hours.  Feel forgetful about what happened after the procedure.  Have poor judgment for several hours.  Feel nauseous or vomit.  Have a sore throat if you had a breathing tube during the procedure. Follow these instructions at home: For at least 24 hours after the procedure:      Have a responsible adult  stay with you. It is important to have someone help care for you until you are awake and alert.  Rest as needed.  Do not: ? Participate in activities in which you could fall or become injured. ? Drive. ? Use heavy machinery. ? Drink alcohol. ? Take sleeping pills or medicines that cause drowsiness. ? Make important decisions or sign legal documents. ? Take care of children on your own. Eating and drinking  Follow the diet that is recommended by your health care provider.  If you vomit, drink water, juice, or soup when  you can drink without vomiting.  Make sure you have little or no nausea before eating solid foods. General instructions  Take over-the-counter and prescription medicines only as told by your health care provider.  If you have sleep apnea, surgery and certain medicines can increase your risk for breathing problems. Follow instructions from your health care provider about wearing your sleep device: ? Anytime you are sleeping, including during daytime naps. ? While taking prescription pain medicines, sleeping medicines, or medicines that make you drowsy.  If you smoke, do not smoke without supervision.  Keep all follow-up visits as told by your health care provider. This is important. Contact a health care provider if:  You keep feeling nauseous or you keep vomiting.  You feel light-headed.  You develop a rash.  You have a fever. Get help right away if:  You have trouble breathing. Summary  For several hours after your procedure, you may feel sleepy and have poor judgment.  Have a responsible adult stay with you for at least 24 hours or until you are awake and alert. This information is not intended to replace advice given to you by your health care provider. Make sure you discuss any questions you have with your health care provider. Document Revised: 01/10/2018 Document Reviewed: 02/02/2016 Elsevier Patient Education  [place].

## 2020-07-15 NOTE — Anesthesia Preprocedure Evaluation (Addendum)
Anesthesia Evaluation  Patient identified by MRN, date of birth, ID band Patient awake    Reviewed: Allergy & Precautions, NPO status , Patient's Chart, lab work & pertinent test results, reviewed documented beta blocker date and time   History of Anesthesia Complications Negative for: history of anesthetic complications  Airway Mallampati: III  TM Distance: >3 FB Neck ROM: Full    Dental  (+) Edentulous Lower, Edentulous Upper   Pulmonary pneumonia, resolved, COPD,  COPD inhaler,    Pulmonary exam normal breath sounds clear to auscultation       Cardiovascular Exercise Tolerance: Poor hypertension, Pt. on medications and Pt. on home beta blockers  Rhythm:Regular Rate:Bradycardia  Left ventricle: The cavity size was normal. Wall thickness was  increased in a pattern of moderate LVH. Systolic function was  normal. The estimated ejection fraction was in the range of 60%  to 65%. Wall motion was normal; there were no regional wall  motion abnormalities. Doppler parameters are consistent with  abnormal left ventricular relaxation (grade 1 diastolic  dysfunction).    Neuro/Psych PSYCHIATRIC DISORDERS Anxiety Depression Schizophrenia TIA Neuromuscular disease CVA, Residual Symptoms    GI/Hepatic Neg liver ROS, hiatal hernia, PUD, Partial colectomy   Endo/Other  Hypothyroidism   Renal/GU Renal InsufficiencyRenal disease     Musculoskeletal  (+) Arthritis  (cervical spondylosis ), Osteoarthritis,    Abdominal   Peds  Hematology  (+) HIV,   Anesthesia Other Findings Steroid-induced psychosis, with hallucinations ([place])  Reproductive/Obstetrics                            Anesthesia Physical Anesthesia Plan  ASA: III  Anesthesia Plan: General   Post-op Pain Management:    Induction: Intravenous  PONV Risk Score and Plan: TIVA  Airway Management Planned: Nasal Cannula, Natural  Airway and Simple Face Mask  Additional Equipment:   Intra-op Plan:   Post-operative Plan:   Informed Consent: I have reviewed the patients History and Physical, chart, labs and discussed the procedure including the risks, benefits and alternatives for the proposed anesthesia with the patient or authorized representative who has indicated his/her understanding and acceptance.       Plan Discussed with: CRNA and Surgeon  Anesthesia Plan Comments:        Anesthesia Quick Evaluation

## 2020-07-15 NOTE — Anesthesia Postprocedure Evaluation (Addendum)
Anesthesia Post Note  Patient: [name]  Procedure(s) Performed: COLONOSCOPY WITH PROPOFOL (N/A ) FLEXIBLE SIGMOIDOSCOPY (N/A ) ESOPHAGOGASTRODUODENOSCOPY (EGD) WITH PROPOFOL (N/A ) MALONEY DILATION (N/A ) BIOPSY POLYPECTOMY  Patient location during evaluation: PACU Anesthesia Type: General Level of consciousness: awake and alert Pain management: pain level controlled Vital Signs Assessment: post-procedure vital signs reviewed and stable Respiratory status: spontaneous breathing Cardiovascular status: blood pressure returned to baseline and stable Postop Assessment: no apparent nausea or vomiting Anesthetic complications: no Comments: Patient started complaining of abdominal pain, Dr. Gala Romney and [place] made aware.  Will offer small dose of pain med.  Reassess.  Received fentanyl 56mcg IV and reported relief of pain.  VSS.  Doing well.   No complications documented.   Last Vitals:  Vitals:   07/15/20 0729  BP: (!) 145/71  Resp: 20  Temp: 36.8 C  SpO2: 100%    Last Pain:  Vitals:   07/15/20 0828  TempSrc:   PainSc: 3                  [name]

## 2020-07-15 NOTE — Op Note (Signed)
[facility] Patient Name: [name] Procedure Date: 07/15/2020 8:52 AM MRN: [id number] Date of Birth: [birth year] Attending MD: Norvel Richards , MD CSN: [id number] Age: 79 Admit Type: Outpatient Procedure:                Colonoscopy through colostomy and sigmoidoscopy                            with snare polypectomy Indications:              High risk colon cancer surveillance: Personal                            history of colonic polyps Providers:                Norvel Richards, MD, Lambert Mody, Starla Link RN, RN, Raphael Gibney, Technician Referring MD:             Lavell Islam. [name], Arnoldo Lenis MD Medicines:                Propofol per Anesthesia Complications:            No immediate complications. Estimated Blood Loss:     Estimated blood loss was minimal. Procedure:                Pre-Anesthesia Assessment:                           - Prior to the procedure, a History and Physical                            was performed, and patient medications and                            allergies were reviewed. The patient's tolerance of                            previous anesthesia was also reviewed. The risks                            and benefits of the procedure and the sedation                            options and risks were discussed with the patient.                            All questions were answered, and informed consent                            was obtained. Prior Anticoagulants: The patient has                            taken no previous anticoagulant or antiplatelet  agents. ASA Grade Assessment: III - A patient with                            severe systemic disease. After reviewing the risks                            and benefits, the patient was deemed in                            satisfactory condition to undergo the procedure.                           After  obtaining informed consent, the colonoscope                            was passed under direct vision. Throughout the                            procedure, the patient's blood pressure, pulse, and                            oxygen saturations were monitored continuously. The                            [id number]-[id number] ([id number]) scope was introduced through                            the anus and advanced to the the cecum, identified                            by appendiceal orifice and ileocecal valve. Scope In: 8:55:24 AM Scope Out: 9:31:33 AM Scope Withdrawal Time: 0 hours 33 minutes 28 seconds  Total Procedure Duration: 0 hours 36 minutes 9 seconds  Findings:      Colostomy opening appears healthy. Digital examination was normal. Scope       inserted per colostomy and easily advancedto the cecum. Patient had       scattered pancolonic diverticula. (2) 15mm serrated appearing polyps in       the cecum. Cold snare removed. The remainder of the colonic mucosa to       the ostomy appeared normal. Subsequently, digital rectal examination was       performed and the colonoscope was advanced per rectum to the       anastomosis. Large stool concretion removed digitally. There was a fair       amount of left colon sequestered distally. I suspect I was well into the       descending segment when the blunt end was reached. The patient's rectal       and colonic mucosa were diffusely friable. [place] diverticula       scattered throughout the sigmoid and descending segments. Otherwise, the       rectal and colonic mucosa appeared normal. Rectal vault too small to       retroflex. Seen well onface. Impression:               -Cecal polyps x2 removed as described above.  Pancolonic diverticulosis. Moderate Sedation:      Moderate (conscious) sedation was personally administered by an       anesthesia professional. The following parameters were monitored: oxygen        saturation, heart rate, blood pressure, respiratory rate, EKG, adequacy       of pulmonary ventilation, and response to care. Recommendation:           - Patient has a contact number available for                            emergencies. The signs and symptoms of potential                            delayed complications were discussed with the                            patient. Return to normal activities tomorrow.                            Written discharge instructions were provided to the                            patient.                           - Advance diet as tolerated. Follow-up on                            pathology. Anticipate patient going ahead with                            colostomy takedown reanastomosis pending pathology                            report. See EGD report. Procedure Code(s):        --- Professional ---                           [phone], Colonoscopy, flexible; diagnostic, including                            collection of specimen(s) by brushing or washing,                            when performed (separate procedure) Diagnosis Code(s):        --- Professional ---                           Z86.010, Personal history of colonic polyps CPT copyright 2019 American Medical Association. All rights reserved. The codes documented in this report are preliminary and upon coder review may  be revised to meet current compliance requirements. Cristopher Estimable. Bryan Omura, MD Norvel Richards, MD 07/15/2020 9:43:26 AM This report has been signed electronically. Number of Addenda: 0

## 2020-07-15 NOTE — Addendum Note (Signed)
Addendum  created 07/15/20 1014 by Ollen Bowl, CRNA   Clinical Note Signed

## 2020-07-16 ENCOUNTER — Telehealth: Payer: Self-pay | Admitting: Gastroenterology

## 2020-07-16 ENCOUNTER — Encounter: Payer: Self-pay | Admitting: Internal Medicine

## 2020-07-16 ENCOUNTER — Other Ambulatory Visit: Payer: Self-pay | Admitting: Gastroenterology

## 2020-07-16 DIAGNOSIS — K2100 Gastro-esophageal reflux disease with esophagitis, without bleeding: Secondary | ICD-10-CM

## 2020-07-16 LAB — SURGICAL PATHOLOGY

## 2020-07-16 MED ORDER — PANTOPRAZOLE SODIUM 40 MG PO TBEC: 40.0000 mg | DELAYED_RELEASE_TABLET | Freq: Every day | ORAL | 5 refills | Status: DC

## 2020-07-16 NOTE — Telephone Encounter (Signed)
Received staff message from Dr. Gala Romney stating patient has reflux and needs daily PPI. Requested I reach out to ID as patient is chronically on HIV medications. Spoke with Dr. [name] who states there is no concerning interaction between patient's HIV medication (Triumeq) and PPI.   I would like to start her on Protonix 40 mg daily. Tried to call to discuss with patient but had to leave message on voice mail.   Elmo Putt, please let patient know the above recommendations. I will send in Protonix 40 mg daily to her pharmacy once she is aware.

## 2020-07-16 NOTE — Telephone Encounter (Signed)
Patient returned call. She is aware of Kristen's/Dr. Rourk's recommendations below. She was fine with starting PPI. She is aware she will take protonix once a day. She asked this be sent to [facility]. Patient aware this will be sent in.

## 2020-07-16 NOTE — Op Note (Signed)
[facility] Patient Name: [name] Procedure Date: 07/15/2020 8:08 AM MRN: [id number] Date of Birth: [birth year] Attending MD: Norvel Richards , MD CSN: [id number] Age: 79 Admit Type: Outpatient Procedure:                Upper GI endoscopy Indications:              Dysphagia Providers:                Norvel Richards, MD, Charlsie Quest. Theda Sers RN, RN,                            Lambert Mody Referring MD:              Medicines:                Propofol per Anesthesia Complications:            No immediate complications. Estimated Blood Loss:     Estimated blood loss was minimal. Procedure:                Pre-Anesthesia Assessment:                           - Prior to the procedure, a History and Physical                            was performed, and patient medications and                            allergies were reviewed. The patient's tolerance of                            previous anesthesia was also reviewed. The risks                            and benefits of the procedure and the sedation                            options and risks were discussed with the patient.                            All questions were answered, and informed consent                            was obtained. Prior Anticoagulants: The patient has                            taken no previous anticoagulant or antiplatelet                            agents. ASA Grade Assessment: III - A patient with                            severe systemic disease. After reviewing the risks  and benefits, the patient was deemed in                            satisfactory condition to undergo the procedure.                           After obtaining informed consent, the endoscope was                            passed under direct vision. Throughout the                            procedure, the patient's blood pressure, pulse, and                            oxygen saturations were  monitored continuously. The                            GIF-H190 ([id number]) scope was introduced through the                            mouth, and advanced to the second part of duodenum.                            The upper GI endoscopy was accomplished without                            difficulty. The patient tolerated the procedure                            well. Scope In: 8:33:24 AM Scope Out: 8:42:54 AM Total Procedure Duration: 0 hours 9 minutes 30 seconds  Findings:      Mild erosions straddling the GE junction with some nodularity. No tumor       or Barrett's epithelium seen. There was mild narrowing here. I easily       passed the adult scope across it, however.      Stomach empty. Mottling of gastric mucosa. Scattered erosions. No ulcer       or infiltrating process seen. Patent pylorus.      The duodenal bulb and second portion of the duodenum were normal.      Scope removed. A 54 French Maloney dilator was passed to full insertion       easily. Subsequently, a 57 French Maloney dilator was passed with ease       to full insertion. A look back revealed no apparent complication related       to these maneuvers.. Finally, biopsies the abnormal appearing GE       junction as well as abnormal gastric mucosa taken for histologic study. Impression:               -Inflammatory change of the GE junction consistent                            with reflux esophagitis some nodularity -status  post dilation and biopsy                           -Abnormal gastric mucosa of uncertain significance                            - status post biopsy                           -Normal duodenal bulb and second portion of the                            duodenum. Moderate Sedation:      Moderate (conscious) sedation was personally administered by an       anesthesia professional. The following parameters were monitored: oxygen       saturation, heart rate, blood pressure,  respiratory rate, EKG, adequacy       of pulmonary ventilation, and response to care. Recommendation:           - Patient has a contact number available for                            emergencies. The signs and symptoms of potential                            delayed complications were discussed with the                            patient. Return to normal activities tomorrow.                            Written discharge instructions were provided to the                            patient.                           - Advance diet as tolerated. Needs acid suppression                            therapy. Will discuss with ID for initiating.                            Follow-up on pathology. See colonoscopy report. Procedure Code(s):        --- Professional ---                           [id number], Esophagogastroduodenoscopy, flexible,                            transoral; diagnostic, including collection of                            specimen(s) by brushing or washing, when performed                            (  separate procedure) Diagnosis Code(s):        --- Professional ---                           R13.10, Dysphagia, unspecified CPT copyright 2019 [employer]. All rights reserved. The codes documented in this report are preliminary and upon coder review may  be revised to meet current compliance requirements. Cristopher Estimable. Michi Herrmann, MD Norvel Richards, MD 07/16/2020 12:52:38 PM This report has been signed electronically. Number of Addenda: 0

## 2020-07-16 NOTE — Telephone Encounter (Signed)
Noted.  Rx sent.

## 2020-07-16 NOTE — Telephone Encounter (Signed)
Lmom for pt to call me back. 

## 2020-07-18 ENCOUNTER — Encounter (HOSPITAL_COMMUNITY): Payer: Self-pay | Admitting: Internal Medicine

## 2020-07-30 ENCOUNTER — Encounter: Payer: Self-pay | Admitting: General Surgery

## 2020-07-30 ENCOUNTER — Ambulatory Visit (INDEPENDENT_AMBULATORY_CARE_PROVIDER_SITE_OTHER): Payer: Medicare Other | Admitting: General Surgery

## 2020-07-30 ENCOUNTER — Other Ambulatory Visit: Payer: Self-pay

## 2020-07-30 VITALS — BP 107/51 | HR 42 | Temp 99.0°F | Resp 16 | Ht 65.0 in | Wt 171.0 lb

## 2020-07-30 DIAGNOSIS — Z933 Colostomy status: Secondary | ICD-10-CM | POA: Diagnosis not present

## 2020-07-30 NOTE — Patient Instructions (Addendum)
You will see Dr. Harl Bowie and he will decide if there needs to be any additional work up before surgery.  We will see you back In November to get you scheduled for surgery pending COVID numbers and inpatient hospital beds.    Colostomy Reversal Surgery A colostomy reversal is a surgical procedure that is done to reverse a colostomy. In this reversal procedure, the large intestine is disconnected from the opening in the abdomen (stoma). Then, the changes that were made to the intestine during the colostomy will be reversed to restore the flow of stool through the entire intestine. Depending on the type of colostomy being reversed, this may involve one of the following:  Reconnecting the two ends of the intestine that were separated during colostomy surgery.  Closing the opening that was made in the side of the intestine to allow stool to be redirected through the stoma. After this surgery, a stoma and colostomy bag are no longer needed. Stool (feces) can leave your body through the rectum, as it did before you had a colostomy. Tell a health care provider about:  Any allergies you have.  All medicines you are taking, including vitamins, herbs, eye drops, creams, and over-the-counter medicines.  Any problems you or family members have had with anesthetic medicines.  Any blood disorders you have.  Any surgeries you have had.  Any medical conditions you have.  Whether you are pregnant or may be pregnant. What are the risks? Generally, this is a safe procedure. However, problems may occur, including:  Infection.  Bleeding.  Allergic reactions to medicines.  Damage to other structures or organs.  A temporary condition in which the intestines stop moving and working correctly (ileus). This usually goes away in 3-7 days.  A collection of pus (abscess) in the abdomen or pelvis.  Intestinal blockage.  Leaking at the area of the intestine where it was reconnected (anastomotic leak) or  where the opening of the stoma was closed.  Narrowing of the intestine (stricture) at the place where it was reconnected.  Urinary and sexual dysfunction. What happens before the procedure? Medicines Ask your health care provider about:  Changing or stopping your regular medicines. This is especially important if you are taking diabetes medicines or blood thinners.  Taking medicines such as aspirin and ibuprofen. These medicines can thin your blood. Do not take these medicines unless your health care provider tells you to take them.  Taking over-the-counter medicines, vitamins, herbs, and supplements General instructions  You may have an exam or testing.  Plan to have someone take you home after the procedure.  Plan to have a responsible adult care for you for at least 24 hours after you leave the hospital or clinic. This is important.  Do not use any products that contain nicotine or tobacco, such as cigarettes, e-cigarettes, and chewing tobacco. These can delay incision healing after surgery. If you need help quitting, ask your health care provider.  Ask your health care provider what steps will be taken to help prevent infection. These may include: ? Removing hair at the surgery site. ? Washing skin with a germ-killing soap. ? Antibiotic medicine. What happens during the procedure?   An IV will be inserted into one of your veins.  You may be given: ? A medicine to help you relax (sedative). ? A medicine to make you fall asleep (general anesthetic).  An incision will be made in your abdomen at the site of the stoma.  The large intestine will  be disconnected from the abdomen at the site of the stoma.  The next steps will vary depending on the type of colostomy reversal surgery you are having. There are two main types: ? End colostomy reversal. The surgeon will use stitches (sutures) or staples to reconnect the two ends of the intestine that were separated during the end  colostomy. ? Loop colostomy reversal. The surgeon will use sutures or staples to close the opening in the intestine that had been allowing stool to be redirected through the stoma. The intestine will then be put back into its normal position inside the abdomen.  The incision will be closed with sutures, skin glue, or adhesive strips. It may be covered with bandages (dressings). The procedure may vary among health care providers and hospitals. What happens after the procedure?  Your blood pressure, heart rate, breathing rate, and blood oxygen level will be monitored until you leave the hospital or clinic.  You will be given pain medicine as needed.  You will slowly increase your diet and movement as told by your health care provider. Summary  A colostomy reversal is a surgical procedure that is done to reverse a colostomy. After this surgery, stool (feces) can leave your body through the rectum, as it did before you had a colostomy.  Before the procedure, follow instructions from your health care provider about taking medicines and about eating and drinking.  During the procedure, your colostomy will be reversed, and the incision will be closed with sutures, skin glue, or adhesive strips. It may be covered with bandages (dressings).  After the procedure, you will slowly increase your diet and movement as told by your health care provider. This information is not intended to replace advice given to you by your health care provider. Make sure you discuss any questions you have with your health care provider. Document Revised: 03/30/2018 Document Reviewed: 03/30/2018 Elsevier Patient Education  [place].

## 2020-07-30 NOTE — Progress Notes (Signed)
[facility] Note   HPI:  79 y.o. Female presents to clinic for follow-up evaluation of her colostomy. She has had this for some time and had emergency surgery for a stercoral ulcer. She underwent colonoscopy and was found to have polyps and diverticula. She has been to the ED with some bleeding from the ostomy but otherwise has done well. She is due to see cardiology in 1 month. She is here to discuss reversal and is ready to have this completed.   Review of Systems:  Ostomy with stool HIV controlled All other review of systems: otherwise negative   Vital Signs:  BP (!) 107/51   Pulse (!) 42   Temp 99 F (37.2 C) (Oral)   Resp 16   Ht 5\' 5"  (1.651 m)   Wt 171 lb (77.6 kg)   SpO2 97%   BMI 28.46 kg/m    Physical Exam:  Physical Exam Vitals reviewed.  Cardiovascular:     Rate and Rhythm: Normal rate.  Pulmonary:     Effort: Pulmonary effort is normal.  Abdominal:     General: There is no distension.     Palpations: Abdomen is soft.     Tenderness: There is no abdominal tenderness.     Comments: Hernia midline, ostomy in place and pink  Skin:    General: Skin is warm.  Neurological:     General: No focal deficit present.     Mental Status: She is alert.  Psychiatric:        Mood and Affect: Mood normal.     Assessment:  79 y.o. yo Female with HIV that is well controlled and ostomy in place. She has had a colonoscopy and is ready to get her reversal. She has a history of remote A fib that converted and is not on any blood thinners. She sees cardiology in November. Discussed that we are still post poning as much elective inpatient surgery as possible. Discussed seeing her back after Dr. Nelly Laurence appt and getting him to risk stratify her for surgery.  Plan:  Preoperative risk stratification, I sent Dr. Harl Bowie a message See back in 1 month to get scheduled   Future Appointments  Date Time Provider [place]  09/03/2020  2:40 PM Arnoldo Lenis, MD [facility]  09/10/2020  9:15 AM Virl Cagey, MD [facility] None  12/17/2020  3:30 PM [name], Lavell Islam, MD [facility]     Curlene Labrum, MD [facility] [address] [place], [place] [address] [phone] (office)

## 2020-09-03 ENCOUNTER — Ambulatory Visit (INDEPENDENT_AMBULATORY_CARE_PROVIDER_SITE_OTHER): Payer: Medicare Other | Admitting: Cardiology

## 2020-09-03 VITALS — BP 144/70 | HR 51 | Ht 62.0 in | Wt 166.0 lb

## 2020-09-03 DIAGNOSIS — I10 Essential (primary) hypertension: Secondary | ICD-10-CM | POA: Diagnosis not present

## 2020-09-03 DIAGNOSIS — I9789 Other postprocedural complications and disorders of the circulatory system, not elsewhere classified: Secondary | ICD-10-CM | POA: Diagnosis not present

## 2020-09-03 DIAGNOSIS — I4891 Unspecified atrial fibrillation: Secondary | ICD-10-CM | POA: Diagnosis not present

## 2020-09-03 DIAGNOSIS — Z01810 Encounter for preprocedural cardiovascular examination: Secondary | ICD-10-CM

## 2020-09-03 NOTE — Progress Notes (Signed)
Clinical Summary [name] is a 79 y.o.female seen today for follow up of the following medical problems.   1. Afib, postoperative - new diagnosis during Jan 2020 admission - occurred postop after abdominal surgery - short duration of afib <6 hrs. Was not committed to anticoag given isolated episode postop.CHADS2Vasc score is 6 (age x2, gender, TIA x 2, HTN.     - occasional palpitations, lasts just a few seconds - did not lower her bystolic to 5mg  as discussed last visit, at that time had low HRs in the 40s  2. HTN - has not taken meds yet today   3. Preoperative evaluation  considering colostomy reversal - can walk a flight of stairs without symptoms    Past Medical History:  Diagnosis Date  . Anxiety   . Arthritis   . Bronchitis   . CKD (chronic kidney disease) stage 3, GFR 30-59 ml/min (HCC) 05/04/2016  . COPD (chronic obstructive pulmonary disease) ([place])   . Depression   . Dysuria 03/27/2019  . Elevated LFTs 05/04/2016  . History of TIAs 1981   left side weakness  . HIV (human immunodeficiency virus infection) ([place])   . Hypertension   . Kidney disease related to HIV infection [facility])    pt states she has to have her creatinine level checked often   . Nausea 03/03/2017  . Peripheral neuropathy    bilateral feet  . Seasonal allergies 03/27/2019  . Sinusitis 05/06/2018  . UTI (urinary tract infection) 03/03/2017  . Weight loss 03/03/2017     Allergies  Allergen Reactions  . Bee Venom Anaphylaxis  . Promethazine Swelling  . Tenofovir Disoproxil Fumarate [Tenofovir Disoproxil Fumarate] Other (See Comments)    Renal failure, (08/27/2006)  . Compazine  [Prochlorperazine Edisylate] Nausea And Vomiting  . Haloperidol Lactate Other (See Comments)    Reaction is muscle tension, causes severe spasms in face and neck. Forces eyes to roll in the back of the head  . Brompheniramine-Acetaminophen   . Chlorcyclizine   . Chlorpromazine   . Codeine Itching and Nausea  And Vomiting  . Navane [Thiothixene] Other (See Comments) and Nausea And Vomiting    Same muscle spasm reaction as haldol  . Prednisone     Brief mild psychosis and agitation  . Promethazine Hcl Other (See Comments)  . Propoxyphene N-Acetaminophen Itching and Nausea And Vomiting  . Morphine Itching and Swelling     Current Outpatient Medications  Medication Sig Dispense Refill  . abacavir-dolutegravir-lamiVUDine (TRIUMEQ) 600-50-300 MG tablet Take 1 tablet by mouth daily. 30 tablet 5  . albuterol (PROVENTIL HFA;VENTOLIN HFA) 108 (90 Base) MCG/ACT inhaler Inhale 1-2 puffs into the lungs every 6 (six) hours as needed for wheezing or shortness of breath. 1 Inhaler 0  . amitriptyline (ELAVIL) 50 MG tablet Take 1 tablet (50 mg total) by mouth at bedtime. 30 tablet 0  . Cholecalciferol (VITAMIN D3) 10000 units TABS Take 1 tablet by mouth daily.    . diphenhydrAMINE (BENADRYL) 25 mg capsule Take 25 mg by mouth daily as needed for itching (itching from codeine).     Marland Kitchen docusate sodium (COLACE) 100 MG capsule Take 1 capsule (100 mg total) by mouth 2 (two) times daily. 10 capsule 0  . escitalopram (LEXAPRO) 10 MG tablet Take 10 mg by mouth daily.    . fluticasone (FLONASE) 50 MCG/ACT nasal spray 2 SPRAYS INTO BOTH NOSTRILS AT BEDTIME. (Patient taking differently: Place 2 sprays into both nostrils at bedtime. ) 16 g 5  .  furosemide (LASIX) 40 MG tablet Take 1 tablet (40 mg total) by mouth daily as needed for fluid. Hold until follow up with PCP    . ipratropium-albuterol (DUONEB) 0.5-2.5 (3) MG/3ML SOLN Inhale 3 mLs into the lungs every 6 (six) hours as needed (shortness of breath).     . LORazepam (ATIVAN) 0.5 MG tablet Take 0.5 mg by mouth 2 (two) times daily.     . magnesium 30 MG tablet Take 30 mg by mouth at bedtime.     . Multiple Vitamin (ONE-A-DAY 55 PLUS PO) Take 1 tablet by mouth daily.    . nebivolol (BYSTOLIC) 5 MG tablet Take 1 tablet (5 mg total) by mouth daily. 90 tablet 1  . Omega-3  Fatty Acids (FISH OIL BURP-LESS) 1000 MG CAPS Take 1 capsule by mouth daily.    . ondansetron (ZOFRAN) 8 MG tablet Take 8 mg by mouth daily as needed for nausea or vomiting.     . Oxycodone HCl 10 MG TABS Take 10 mg by mouth 2 (two) times daily as needed (for pain).    . pantoprazole (PROTONIX) 40 MG tablet Take 1 tablet (40 mg total) by mouth daily before breakfast. 30 tablet 5  . polyvinyl alcohol (LIQUIFILM TEARS) 1.4 % ophthalmic solution Place 1 drop into both eyes daily.     . solifenacin (VESICARE) 10 MG tablet Take 1 tablet (10 mg total) by mouth daily. 30 tablet 11  . thiamine (VITAMIN B-1) 100 MG tablet Take 100 mg by mouth daily.     No current facility-administered medications for this visit.     Past Surgical History:  Procedure Laterality Date  . ABDOMINAL HYSTERECTOMY  1985  . BACK SURGERY    . BIOPSY  07/15/2020   Procedure: BIOPSY;  Surgeon: Daneil Dolin, MD;  [facility];  Service: Endoscopy;;  . CATARACT EXTRACTION, BILATERAL    . CHOLECYSTECTOMY    . COLONOSCOPY  08/10/2002   NUR: Normal colonoscopy except for some hemorrhoids and some erythema at the dentate line  . COLONOSCOPY  12/10/2006   Edwards:Diffuse colitis in the transverse and descending colon/This is not entirely typical of ischemic colitis or her HIV positive/ disease.  We need to be concerned about other causes  . COLONOSCOPY  01/19/2011   RMR: Internal and external hemorrhoids, likely source of hematochezia anal papilla, otherwise normal rectal mucosa/ Left-sided diverticula.  Cecal polyp with status post cold snare polypectomy.  Remainder of colonic mucosa appeared normal. Pathology with tubular adenoma. Repeat in 2017  . COLONOSCOPY WITH PROPOFOL N/A 07/15/2020   Procedure: COLONOSCOPY WITH PROPOFOL;  Surgeon: Daneil Dolin, MD;  [facility];  Service: Endoscopy;  Laterality: N/A;  8:15AM TCS via Ostomy  . COLOSTOMY N/A 11/12/2018   Procedure: COLOSTOMY;  Surgeon: Virl Cagey, MD;  Location: [facility];  Service: General;  Laterality: N/A;  . ESOPHAGOGASTRODUODENOSCOPY  08/10/2002     NUR: Mild changes of reflux esophagitis limited to gastroesophageal junction  No evidence of ring, stricture, or esophageal candidiasis dysphagia/ Gastritis, possibly H. pylori induced  . ESOPHAGOGASTRODUODENOSCOPY  12/25/2002   NUR: Erosive antral gastritis.  The degree of gastritis is more significant than her last exam/ Normal examination of the esophagus/ Esophageal dilatation performed by passing 73 and 9 [place]  . ESOPHAGOGASTRODUODENOSCOPY  11/13/2010   RMR: Circumferential distal esophageal erosions with soft peptic stricture, consistent with erosive reflux esophagitis or stricture  formation, status post dilation as described above/  Small hiatal  hernia/ Tiny antral erosions, otherwise normal stomach D1 and D2  . ESOPHAGOGASTRODUODENOSCOPY (EGD) WITH PROPOFOL N/A 07/15/2020   Procedure: ESOPHAGOGASTRODUODENOSCOPY (EGD) WITH PROPOFOL;  Surgeon: Daneil Dolin, MD;  [facility];  Service: Endoscopy;  Laterality: N/A;  . FLEXIBLE SIGMOIDOSCOPY N/A 07/15/2020   Procedure: FLEXIBLE SIGMOIDOSCOPY;  Surgeon: Daneil Dolin, MD;  [facility];  Service: Endoscopy;  Laterality: N/A;  . HEMORRHOID SURGERY  09/02/2012   Procedure: HEMORRHOIDECTOMY;  Surgeon: Jamesetta So, MD;  Location: [facility];  Service: General;  Laterality: N/A;  . LAPAROTOMY N/A 11/12/2018   Procedure: EXPLORATORY LAPAROTOMY;  Surgeon: Virl Cagey, MD;  Location: [facility];  Service: General;  Laterality: N/A;  Venia Minks DILATION N/A 07/15/2020   Procedure: Keturah Shavers;  Surgeon: Daneil Dolin, MD;  [facility];  Service: Endoscopy;  Laterality: N/A;  . PARTIAL COLECTOMY N/A 11/12/2018   Procedure: PARTIAL COLECTOMY;  Surgeon: Virl Cagey, MD;  Location: [facility];  Service: General;  Laterality: N/A;  . POLYPECTOMY  07/15/2020   Procedure: POLYPECTOMY;   Surgeon: Daneil Dolin, MD;  [facility];  Service: Endoscopy;;  . TUBAL LIGATION       Allergies  Allergen Reactions  . Bee Venom Anaphylaxis  . Promethazine Swelling  . Tenofovir Disoproxil Fumarate [Tenofovir Disoproxil Fumarate] Other (See Comments)    Renal failure, (08/27/2006)  . Compazine  [Prochlorperazine Edisylate] Nausea And Vomiting  . Haloperidol Lactate Other (See Comments)    Reaction is muscle tension, causes severe spasms in face and neck. Forces eyes to roll in the back of the head  . Brompheniramine-Acetaminophen   . Chlorcyclizine   . Chlorpromazine   . Codeine Itching and Nausea And Vomiting  . Navane [Thiothixene] Other (See Comments) and Nausea And Vomiting    Same muscle spasm reaction as haldol  . Prednisone     Brief mild psychosis and agitation  . Promethazine Hcl Other (See Comments)  . Propoxyphene N-Acetaminophen Itching and Nausea And Vomiting  . Morphine Itching and Swelling      Family History  Problem Relation Age of Onset  . Diabetes Mother   . Diabetes Brother   . Colon cancer Brother        Diagnosed > age 62     Social History Ms. Karn reports that she has never smoked. She has never used smokeless tobacco. Ms. Shoemaker reports no history of alcohol use.   Review of Systems CONSTITUTIONAL: No weight loss, fever, chills, weakness or fatigue.  HEENT: Eyes: No visual loss, blurred vision, double vision or yellow sclerae.No hearing loss, sneezing, congestion, runny nose or sore throat.  SKIN: No rash or itching.  CARDIOVASCULAR: per hpi RESPIRATORY: No shortness of breath, cough or sputum.  GASTROINTESTINAL: No anorexia, nausea, vomiting or diarrhea. No abdominal pain or blood.  GENITOURINARY: No burning on urination, no polyuria NEUROLOGICAL: No headache, dizziness, syncope, paralysis, ataxia, numbness or tingling in the extremities. No change in bowel or bladder control.  MUSCULOSKELETAL: No muscle, back pain,  joint pain or stiffness.  LYMPHATICS: No enlarged nodes. No history of splenectomy.  PSYCHIATRIC: No history of depression or anxiety.  ENDOCRINOLOGIC: No reports of sweating, cold or heat intolerance. No polyuria or polydipsia.  Marland Kitchen   Physical Examination Today's Vitals   09/03/20 1410  BP: (!) 144/70  Pulse: (!) 51  SpO2: 99%  Weight: 166 lb (75.3 kg)  Height: 5\' 2"  (1.575 m)   Body mass index is  30.36 kg/m.  Gen: resting comfortably, no acute distress HEENT: no scleral icterus, pupils equal round and reactive, no palptable cervical adenopathy,  CV:  Resp: Clear to auscultation bilaterally GI: abdomen is soft, non-tender, non-distended, normal bowel sounds, no hepatosplenomegaly MSK: extremities are warm, no edema.  Skin: warm, no rash Neuro:  no focal deficits Psych: appropriate affect   Diagnostic Studies Jan 2020 echo Study Conclusions  - Left ventricle: The cavity size was normal. Wall thickness was increased in a pattern of moderate LVH. Systolic function was normal. The estimated ejection fraction was in the range of 60% to 65%. Wall motion was normal; there were no regional wall motion abnormalities. Doppler parameters are consistent with abnormal left ventricular relaxation (grade 1 diastolic dysfunction). - Aortic valve: Mildly calcified annulus. Trileaflet; mildly thickened leaflets. Valve area (VTI): 2.67 cm^2. Valve area (Vmax): 2.36 cm^2. - Mitral valve: Mildly calcified annulus. Mildly thickened leaflets    Assessment and Plan   1. Postop Afib - isolated episode postop short in duration, no recurrence. She was very symptomatic when occurred in the hospital, no similar symptoms since.  - continue to monitor. Lower her bystolic to 5mg  daily due to low HRs  2. HTN -above goal but has not taken meds yet today   3. Preoperative evaluation - recommend proceeding with abdominal surgery as planned, no additional cardiac workup  indicated - tolerates greater than 4METs without symptoms.   F/u 6 months.       Arnoldo Lenis, M.D.

## 2020-09-03 NOTE — Patient Instructions (Addendum)
Your physician wants you to follow-up in: [place] will receive a reminder letter in the mail two months in advance. If you don't receive a letter, please call our office to schedule the follow-up appointment.  Your physician has recommended you make the following change in your medication:   TAKE BYSTOLIC 5 MG DAILY   Thank you for choosing [facility]!!

## 2020-09-10 ENCOUNTER — Ambulatory Visit: Payer: Medicare Other | Admitting: General Surgery

## 2020-09-12 ENCOUNTER — Encounter: Payer: Self-pay | Admitting: General Surgery

## 2020-09-12 ENCOUNTER — Ambulatory Visit (INDEPENDENT_AMBULATORY_CARE_PROVIDER_SITE_OTHER): Payer: Medicare Other | Admitting: General Surgery

## 2020-09-12 ENCOUNTER — Other Ambulatory Visit: Payer: Self-pay

## 2020-09-12 VITALS — BP 143/77 | HR 46 | Temp 98.4°F | Resp 16 | Ht 62.0 in | Wt 165.0 lb

## 2020-09-12 DIAGNOSIS — Z933 Colostomy status: Secondary | ICD-10-CM | POA: Insufficient documentation

## 2020-09-12 DIAGNOSIS — R221 Localized swelling, mass and lump, neck: Secondary | ICD-10-CM | POA: Diagnosis not present

## 2020-09-12 HISTORY — DX: Colostomy status: Z93.3

## 2020-09-12 MED ORDER — METRONIDAZOLE 500 MG PO TABS: 500.0000 mg | ORAL_TABLET | ORAL | 0 refills | Status: DC

## 2020-09-12 MED ORDER — DULCOLAX 5 MG PO TBEC: 20.0000 mg | DELAYED_RELEASE_TABLET | Freq: Once | ORAL | 0 refills | Status: AC

## 2020-09-12 MED ORDER — NEOMYCIN SULFATE 500 MG PO TABS: 1000.0000 mg | ORAL_TABLET | ORAL | 0 refills | Status: DC

## 2020-09-12 NOTE — Patient Instructions (Signed)
Will call with [place] of your neck results.   Colon Preparation: Buy from the Store: Miralax bottle [phone]).  Gatorade 64 oz (not red). Dulcolax tablets.   The Day Prior to Surgery: Take 4 ducolax tablets at 7am with water. Do an enema through your rectum at 8AM. Drink plenty of clear liquids all day to avoid dehydration, no solid food.    Mix the bottle of Miralax and 64 oz of Gatorade and drink this mixture starting at 10am.  Drink it gradually over the next few hours, 8 ounces every 15-30 minutes until it is gone. Finish this by 2pm.  Repeat an enema at 2pm.  Take 2 neomycin 500mg  tablets and 2 metronidazole 500mg  tablets at 2 pm. Take 2 neomycin 500mg  tablets and 2 metronidazole 500mg  tablets at 3pm. Take 2 neomycin 500mg  tablets and 2 metronidazole 500mg  tablets at 10pm.    Do not eat or drink anything after midnight the night before your surgery.  Do not eat or drink anything that morning, and take medications as instructed by the hospital staff on your preoperative visit.    Colostomy Reversal Surgery A colostomy reversal is a surgical procedure that is done to reverse a colostomy. In this reversal procedure, the large intestine is disconnected from the opening in the abdomen (stoma). Then, the changes that were made to the intestine during the colostomy will be reversed to restore the flow of stool through the entire intestine. Depending on the type of colostomy being reversed, this may involve one of the following:  Reconnecting the two ends of the intestine that were separated during colostomy surgery.  Closing the opening that was made in the side of the intestine to allow stool to be redirected through the stoma. After this surgery, a stoma and colostomy bag are no longer needed. Stool (feces) can leave your body through the rectum, as it did before you had a colostomy. Tell a health care provider about:  Any allergies you have.  All medicines you are taking, including  vitamins, herbs, eye drops, creams, and over-the-counter medicines.  Any problems you or family members have had with anesthetic medicines.  Any blood disorders you have.  Any surgeries you have had.  Any medical conditions you have.  Whether you are pregnant or may be pregnant. What are the risks? Generally, this is a safe procedure. However, problems may occur, including:  Infection.  Bleeding.  Allergic reactions to medicines.  Damage to other structures or organs.  A temporary condition in which the intestines stop moving and working correctly (ileus). This usually goes away in 3-7 days.  A collection of pus (abscess) in the abdomen or pelvis.  Intestinal blockage.  Leaking at the area of the intestine where it was reconnected (anastomotic leak) or where the opening of the stoma was closed.  Narrowing of the intestine (stricture) at the place where it was reconnected.  Urinary and sexual dysfunction. What happens before the procedure? Medicines Ask your health care provider about:  Changing or stopping your regular medicines. This is especially important if you are taking diabetes medicines or blood thinners.  Taking medicines such as aspirin and ibuprofen. These medicines can thin your blood. Do not take these medicines unless your health care provider tells you to take them.  Taking over-the-counter medicines, vitamins, herbs, and supplements. Staying hydrated Follow instructions from your health care provider about hydration, which may include:  Up to 2 hours before the procedure - you may continue to drink clear liquids,  such as water, clear fruit juice, black coffee, and plain tea.  Eating and drinking restrictions Follow instructions from your health care provider about eating and drinking, which may include:  8 hours before the procedure - stop eating heavy meals or foods, such as meat, fried foods, or fatty foods.  6 hours before the procedure - stop  eating light meals or foods, such as toast or cereal.  6 hours before the procedure - stop drinking milk or drinks that contain milk.  2 hours before the procedure - stop drinking clear liquids. General instructions  You may have an exam or testing.  Plan to have someone take you home after the procedure.  Plan to have a responsible adult care for you for at least 24 hours after you leave the hospital or clinic. This is important.  Do not use any products that contain nicotine or tobacco, such as cigarettes, e-cigarettes, and chewing tobacco. These can delay incision healing after surgery. If you need help quitting, ask your health care provider.  Ask your health care provider what steps will be taken to help prevent infection. These may include: ? Removing hair at the surgery site. ? Washing skin with a germ-killing soap. ? Antibiotic medicine. What happens during the procedure?   An IV will be inserted into one of your veins.  You may be given: ? A medicine to help you relax (sedative). ? A medicine to make you fall asleep (general anesthetic).  An incision will be made in your abdomen at the site of the stoma.  The large intestine will be disconnected from the abdomen at the site of the stoma.  The next steps will vary depending on the type of colostomy reversal surgery you are having. There are two main types: ? End colostomy reversal. The surgeon will use stitches (sutures) or staples to reconnect the two ends of the intestine that were separated during the end colostomy. ? Loop colostomy reversal. The surgeon will use sutures or staples to close the opening in the intestine that had been allowing stool to be redirected through the stoma. The intestine will then be put back into its normal position inside the abdomen.  The incision will be closed with sutures, skin glue, or adhesive strips. It may be covered with bandages (dressings). The procedure may vary among health care  providers and hospitals. What happens after the procedure?  Your blood pressure, heart rate, breathing rate, and blood oxygen level will be monitored until you leave the hospital or clinic.  You will be given pain medicine as needed.  You will slowly increase your diet and movement as told by your health care provider. Summary  A colostomy reversal is a surgical procedure that is done to reverse a colostomy. After this surgery, stool (feces) can leave your body through the rectum, as it did before you had a colostomy.  Before the procedure, follow instructions from your health care provider about taking medicines and about eating and drinking.  During the procedure, your colostomy will be reversed, and the incision will be closed with sutures, skin glue, or adhesive strips. It may be covered with bandages (dressings).  After the procedure, you will slowly increase your diet and movement as told by your health care provider. This information is not intended to replace advice given to you by your health care provider. Make sure you discuss any questions you have with your health care provider. Document Revised: 03/30/2018 Document Reviewed: 03/30/2018 Elsevier Patient Education  [place].

## 2020-09-12 NOTE — Progress Notes (Signed)
[facility] History and Physical   Chief Complaint    Follow-up      [name] is a 79 y.o. female.  HPI: [name] is well known to me after a end colostomy 10/2018 for pneumoperitoneum related to a stercoral ulcer. She has done well with her colostomy and has a history of COPD, HIV, CKD, CAD and overall is doing well. she is depressed about the bag and wants it reversed. She has had her colonoscopy and this demonstrated diverticulosis throughout and cecal polyps but nothing concerning. She has been seen by cardiology and no additional workup was recommended. She is feeling good and wants to proceed with reversal as soon as possible.  She does note a hernia in the midline and a bulge around her ostomy. She has a CT from a few months back that demonstrate the ventral and parastomal hernia. She has not had any obstructive symptoms.   She also reports having a lipoma on her left neck that she thinks is getting larger. This does not cause her any breathing difficulty or issues but she is noticing it more in the mirror.   Past Medical History:  Diagnosis Date  . Anxiety   . Arthritis   . Bronchitis   . CKD (chronic kidney disease) stage 3, GFR 30-59 ml/min (HCC) 05/04/2016  . COPD (chronic obstructive pulmonary disease) ([place])   . Depression   . Dysuria 03/27/2019  . Elevated LFTs 05/04/2016  . History of TIAs 1981   left side weakness  . HIV (human immunodeficiency virus infection) ([place])   . Hypertension   . Kidney disease related to HIV infection [facility])    pt states she has to have her creatinine level checked often   . Nausea 03/03/2017  . Peripheral neuropathy    bilateral feet  . Seasonal allergies 03/27/2019  . Sinusitis 05/06/2018  . UTI (urinary tract infection) 03/03/2017  . Weight loss 03/03/2017    Past Surgical History:  Procedure Laterality Date  . ABDOMINAL HYSTERECTOMY  1985  . BACK SURGERY    . BIOPSY  07/15/2020   Procedure: BIOPSY;  Surgeon: Daneil Dolin, MD;  [facility];  Service: Endoscopy;;  . CATARACT EXTRACTION, BILATERAL    . CHOLECYSTECTOMY    . COLONOSCOPY  08/10/2002   NUR: Normal colonoscopy except for some hemorrhoids and some erythema at the dentate line  . COLONOSCOPY  12/10/2006   Edwards:Diffuse colitis in the transverse and descending colon/This is not entirely typical of ischemic colitis or her HIV positive/ disease.  We need to be concerned about other causes  . COLONOSCOPY  01/19/2011   RMR: Internal and external hemorrhoids, likely source of hematochezia anal papilla, otherwise normal rectal mucosa/ Left-sided diverticula.  Cecal polyp with status post cold snare polypectomy.  Remainder of colonic mucosa appeared normal. Pathology with tubular adenoma. Repeat in 2017  . COLONOSCOPY WITH PROPOFOL N/A 07/15/2020   Procedure: COLONOSCOPY WITH PROPOFOL;  Surgeon: Daneil Dolin, MD;  [facility];  Service: Endoscopy;  Laterality: N/A;  8:15AM TCS via Ostomy  . COLOSTOMY N/A 11/12/2018   Procedure: COLOSTOMY;  Surgeon: Virl Cagey, MD;  Location: [facility];  Service: General;  Laterality: N/A;  . ESOPHAGOGASTRODUODENOSCOPY  08/10/2002     NUR: Mild changes of reflux esophagitis limited to gastroesophageal junction  No evidence of ring, stricture, or esophageal candidiasis dysphagia/ Gastritis, possibly H. pylori induced  . ESOPHAGOGASTRODUODENOSCOPY  12/25/2002   NUR: Erosive antral gastritis.  The degree of gastritis is more significant than her last exam/ Normal examination of the esophagus/ Esophageal dilatation performed by passing 45 and [address] [place]  . ESOPHAGOGASTRODUODENOSCOPY  11/13/2010   RMR: Circumferential distal esophageal erosions with soft peptic stricture, consistent with erosive reflux esophagitis or stricture  formation, status post dilation as described above/  Small hiatal hernia/ Tiny antral erosions, otherwise normal stomach D1 and D2  . ESOPHAGOGASTRODUODENOSCOPY  (EGD) WITH PROPOFOL N/A 07/15/2020   Procedure: ESOPHAGOGASTRODUODENOSCOPY (EGD) WITH PROPOFOL;  Surgeon: Daneil Dolin, MD;  [facility];  Service: Endoscopy;  Laterality: N/A;  . FLEXIBLE SIGMOIDOSCOPY N/A 07/15/2020   Procedure: FLEXIBLE SIGMOIDOSCOPY;  Surgeon: Daneil Dolin, MD;  [facility];  Service: Endoscopy;  Laterality: N/A;  . HEMORRHOID SURGERY  09/02/2012   Procedure: HEMORRHOIDECTOMY;  Surgeon: Jamesetta So, MD;  Location: [facility];  Service: General;  Laterality: N/A;  . LAPAROTOMY N/A 11/12/2018   Procedure: EXPLORATORY LAPAROTOMY;  Surgeon: Virl Cagey, MD;  Location: [facility];  Service: General;  Laterality: N/A;  Venia Minks DILATION N/A 07/15/2020   Procedure: Keturah Shavers;  Surgeon: Daneil Dolin, MD;  [facility];  Service: Endoscopy;  Laterality: N/A;  . PARTIAL COLECTOMY N/A 11/12/2018   Procedure: PARTIAL COLECTOMY;  Surgeon: Virl Cagey, MD;  Location: [facility];  Service: General;  Laterality: N/A;  . POLYPECTOMY  07/15/2020   Procedure: POLYPECTOMY;  Surgeon: Daneil Dolin, MD;  [facility];  Service: Endoscopy;;  . TUBAL LIGATION      Family History  Problem Relation Age of Onset  . Diabetes Mother   . Diabetes Brother   . Colon cancer Brother        Diagnosed > age 62    Social History   Tobacco Use  . Smoking status: Never Smoker  . Smokeless tobacco: Never Used  Vaping Use  . Vaping Use: Never used  Substance Use Topics  . Alcohol use: No  . Drug use: No    Medications: I have reviewed the patient's current medications. Allergies as of 09/12/2020      Reactions   Bee Venom Anaphylaxis   Promethazine Swelling   Tenofovir Disoproxil Fumarate [tenofovir Disoproxil Fumarate] Other (See Comments)   Renal failure, (08/27/2006)   Compazine  [prochlorperazine Edisylate] Nausea And Vomiting   Haloperidol Lactate Other (See Comments)   Reaction is muscle tension, causes severe spasms in  face and neck. Forces eyes to roll in the back of the head   Brompheniramine-acetaminophen    Chlorcyclizine    Chlorpromazine    Codeine Itching, Nausea And Vomiting   Navane [thiothixene] Other (See Comments), Nausea And Vomiting   Same muscle spasm reaction as haldol   Prednisone    Brief mild psychosis and agitation   Promethazine Hcl Other (See Comments)   Propoxyphene N-acetaminophen Itching, Nausea And Vomiting   Morphine Itching, Swelling      Medication List       Accurate as of September 12, 2020  3:32 PM. If you have any questions, ask your nurse or doctor.        albuterol 108 (90 Base) MCG/ACT inhaler Commonly known as: VENTOLIN HFA Inhale 1-2 puffs into the lungs every 6 (six) hours as needed for wheezing or shortness of breath.   Benadryl 25 mg capsule Generic drug: diphenhydrAMINE Take 25 mg by mouth daily as needed for itching (itching from codeine).   docusate sodium 100 MG capsule Commonly  known as: COLACE Take 1 capsule (100 mg total) by mouth 2 (two) times daily.   Fish Oil Burp-Less 1000 MG Caps Take 1 capsule by mouth daily.   fluticasone 50 MCG/ACT nasal spray Commonly known as: FLONASE 2 SPRAYS INTO BOTH NOSTRILS AT BEDTIME. What changed: See the new instructions.   furosemide 40 MG tablet Commonly known as: LASIX Take 1 tablet (40 mg total) by mouth daily as needed for fluid. Hold until follow up with PCP   ipratropium-albuterol 0.5-2.5 (3) MG/3ML Soln Commonly known as: DUONEB Inhale 3 mLs into the lungs every 6 (six) hours as needed (shortness of breath).   LORazepam 0.5 MG tablet Commonly known as: ATIVAN Take 0.5 mg by mouth 2 (two) times daily.   magnesium 30 MG tablet Take 30 mg by mouth at bedtime.   nebivolol 5 MG tablet Commonly known as: Bystolic Take 1 tablet (5 mg total) by mouth daily.   ondansetron 8 MG tablet Commonly known as: ZOFRAN Take 8 mg by mouth daily as needed for nausea or vomiting.   ONE-A-DAY 55 PLUS  PO Take 1 tablet by mouth daily.   Oxycodone HCl 10 MG Tabs Take 10 mg by mouth 2 (two) times daily as needed (for pain).   pantoprazole 40 MG tablet Commonly known as: Protonix Take 1 tablet (40 mg total) by mouth daily before breakfast.   polyvinyl alcohol 1.4 % ophthalmic solution Commonly known as: LIQUIFILM TEARS Place 1 drop into both eyes daily.   thiamine 100 MG tablet Commonly known as: Vitamin B-1 Take 100 mg by mouth daily.   Triumeq 600-50-300 MG tablet Generic drug: abacavir-dolutegravir-lamiVUDine Take 1 tablet by mouth daily.   Vitamin D3 250 MCG (10000 UT) Tabs Take 1 tablet by mouth daily.        ROS:  A comprehensive review of systems was negative except for: Ears, nose, mouth, throat, and face: positive for superficial mass left neck, possible lipoma Gastrointestinal: positive for abdominal pain and hernia midline and parastomal, causing some discomfort, ostomy in place Behavioral/Psych: positive for depressed about ostomy and bag Allergic/Immunologic: positive for HIV on medication and controlled  Blood pressure (!) 143/77, pulse (!) 46, temperature 98.4 F (36.9 C), temperature source Oral, resp. rate 16, height 5\' 2"  (1.575 m), weight 165 lb (74.8 kg), SpO2 98 %. Physical Exam Vitals reviewed.  Constitutional:      Appearance: Normal appearance.  HENT:     Head: Normocephalic.     Nose: Nose normal.     Mouth/Throat:     Mouth: Mucous membranes are moist.  Eyes:     Extraocular Movements: Extraocular movements intact.  Neck:     Comments: Left neck above clavicle superficial fatty area asymmetrical from the right side, no adenopathy Cardiovascular:     Rate and Rhythm: Normal rate and regular rhythm.  Pulmonary:     Effort: Pulmonary effort is normal.  Abdominal:     General: There is no distension.     Palpations: Abdomen is soft.     Tenderness: There is no abdominal tenderness.     Hernia: A hernia is present.     Comments: Midline  defect 3cm and bulging around ostomy, ostomy with bag and stool in bag  Musculoskeletal:        General: No swelling. Normal range of motion.     Cervical back: Normal range of motion. No rigidity.  Skin:    General: Skin is warm.  Neurological:     General: No  focal deficit present.     Mental Status: She is alert and oriented to person, place, and time.  Psychiatric:        Mood and Affect: Mood normal.        Behavior: Behavior normal.        Thought Content: Thought content normal.        Judgment: Judgment normal.     Results: CT 05/2020 with midline hernia about 4cm and parastomal hernia noted   Assessment & Plan:  HELEN WINTERHALTER is a 79 y.o. female with a colostomy in place and hernias who wants to be reversed. She is doing well otherwise and colonoscopy was done and cardiac clearance was completed. She is taking her HIV meds and is controlled. She is overall well.   -Discussed colostomy reversal and risk of bleeding, infection, anastomotic leak, injury to other organs / ureter, and ventral hernia risk after surgery  -Discussed possible need for blood and risk  -Discussed preop COVID testing  -Discussed operative course and stay of about 4-5 days on average  -Will also get a US of the left neck to look at the lipoma   Colon Preparation:  Buy from the Store: Miralax bottle (288g).  Gatorade 64 oz (not red). Dulcolax tablets.   The Day Prior to Surgery: Take 4 ducolax tablets at 7am with water. Do an enema through your rectum at 8AM. Drink plenty of clear liquids all day to avoid dehydration, no solid food.    Mix the bottle of Miralax and 64 oz of Gatorade and drink this mixture starting at 10am.  Drink it gradually over the next few hours, 8 ounces every 15-30 minutes until it is gone. Finish this by 2pm.  Repeat an enema at 2pm.  Take 2 neomycin 500mg  tablets and 2 metronidazole 500mg  tablets at 2 pm. Take 2 neomycin 500mg  tablets and 2 metronidazole 500mg  tablets  at 3pm. Take 2 neomycin 500mg  tablets and 2 metronidazole 500mg  tablets at 10pm.    Do not eat or drink anything after midnight the night before your surgery.  Do not eat or drink anything that morning, and take medications as instructed by the hospital staff on your preoperative visit.    All questions were answered to the satisfaction of the patient.    Virl Cagey 09/12/2020, 3:32 PM

## 2020-09-13 NOTE — H&P (Signed)
[facility] History and Physical   Chief Complaint    Follow-up      [name] is a 79 y.o. female.  HPI: [name] is well known to me after a end colostomy 10/2018 for pneumoperitoneum related to a stercoral ulcer. She has done well with her colostomy and has a history of COPD, HIV, CKD, CAD and overall is doing well. she is depressed about the bag and wants it reversed. She has had her colonoscopy and this demonstrated diverticulosis throughout and cecal polyps but nothing concerning. She has been seen by cardiology and no additional workup was recommended. She is feeling good and wants to proceed with reversal as soon as possible.  She does note a hernia in the midline and a bulge around her ostomy. She has a CT from a few months back that demonstrate the ventral and parastomal hernia. She has not had any obstructive symptoms.   She also reports having a lipoma on her left neck that she thinks is getting larger. This does not cause her any breathing difficulty or issues but she is noticing it more in the mirror.   Past Medical History:  Diagnosis Date  . Anxiety   . Arthritis   . Bronchitis   . CKD (chronic kidney disease) stage 3, GFR 30-59 ml/min (HCC) 05/04/2016  . COPD (chronic obstructive pulmonary disease) ([place])   . Depression   . Dysuria 03/27/2019  . Elevated LFTs 05/04/2016  . History of TIAs 1981   left side weakness  . HIV (human immunodeficiency virus infection) ([place])   . Hypertension   . Kidney disease related to HIV infection [facility])    pt states she has to have her creatinine level checked often   . Nausea 03/03/2017  . Peripheral neuropathy    bilateral feet  . Seasonal allergies 03/27/2019  . Sinusitis 05/06/2018  . UTI (urinary tract infection) 03/03/2017  . Weight loss 03/03/2017    Past Surgical History:  Procedure Laterality Date  . ABDOMINAL HYSTERECTOMY  1985  . BACK SURGERY    . BIOPSY  07/15/2020   Procedure: BIOPSY;  Surgeon: Daneil Dolin, MD;  [facility];  Service: Endoscopy;;  . CATARACT EXTRACTION, BILATERAL    . CHOLECYSTECTOMY    . COLONOSCOPY  08/10/2002   NUR: Normal colonoscopy except for some hemorrhoids and some erythema at the dentate line  . COLONOSCOPY  12/10/2006   Edwards:Diffuse colitis in the transverse and descending colon/This is not entirely typical of ischemic colitis or her HIV positive/ disease.  We need to be concerned about other causes  . COLONOSCOPY  01/19/2011   RMR: Internal and external hemorrhoids, likely source of hematochezia anal papilla, otherwise normal rectal mucosa/ Left-sided diverticula.  Cecal polyp with status post cold snare polypectomy.  Remainder of colonic mucosa appeared normal. Pathology with tubular adenoma. Repeat in 2017  . COLONOSCOPY WITH PROPOFOL N/A 07/15/2020   Procedure: COLONOSCOPY WITH PROPOFOL;  Surgeon: Daneil Dolin, MD;  [facility];  Service: Endoscopy;  Laterality: N/A;  8:15AM TCS via Ostomy  . COLOSTOMY N/A 11/12/2018   Procedure: COLOSTOMY;  Surgeon: Tanya Cagey, MD;  Location: [facility];  Service: General;  Laterality: N/A;  . ESOPHAGOGASTRODUODENOSCOPY  08/10/2002     NUR: Mild changes of reflux esophagitis limited to gastroesophageal junction  No evidence of ring, stricture, or esophageal candidiasis dysphagia/ Gastritis, possibly H. pylori induced  . ESOPHAGOGASTRODUODENOSCOPY  12/25/2002   NUR: Erosive antral gastritis.  The degree of gastritis is more significant than her last exam/ Normal examination of the esophagus/ Esophageal dilatation performed by passing 31 and [address] [place]  . ESOPHAGOGASTRODUODENOSCOPY  11/13/2010   RMR: Circumferential distal esophageal erosions with soft peptic stricture, consistent with erosive reflux esophagitis or stricture  formation, status post dilation as described above/  Small hiatal hernia/ Tiny antral erosions, otherwise normal stomach D1 and D2  . ESOPHAGOGASTRODUODENOSCOPY  (EGD) WITH PROPOFOL N/A 07/15/2020   Procedure: ESOPHAGOGASTRODUODENOSCOPY (EGD) WITH PROPOFOL;  Surgeon: Daneil Dolin, MD;  [facility];  Service: Endoscopy;  Laterality: N/A;  . FLEXIBLE SIGMOIDOSCOPY N/A 07/15/2020   Procedure: FLEXIBLE SIGMOIDOSCOPY;  Surgeon: Daneil Dolin, MD;  [facility];  Service: Endoscopy;  Laterality: N/A;  . HEMORRHOID SURGERY  09/02/2012   Procedure: HEMORRHOIDECTOMY;  Surgeon: Jamesetta So, MD;  Location: [facility];  Service: General;  Laterality: N/A;  . LAPAROTOMY N/A 11/12/2018   Procedure: EXPLORATORY LAPAROTOMY;  Surgeon: Tanya Cagey, MD;  Location: [facility];  Service: General;  Laterality: N/A;  Venia Minks DILATION N/A 07/15/2020   Procedure: Keturah Shavers;  Surgeon: Daneil Dolin, MD;  [facility];  Service: Endoscopy;  Laterality: N/A;  . PARTIAL COLECTOMY N/A 11/12/2018   Procedure: PARTIAL COLECTOMY;  Surgeon: Tanya Cagey, MD;  Location: [facility];  Service: General;  Laterality: N/A;  . POLYPECTOMY  07/15/2020   Procedure: POLYPECTOMY;  Surgeon: Daneil Dolin, MD;  [facility];  Service: Endoscopy;;  . TUBAL LIGATION      Family History  Problem Relation Age of Onset  . Diabetes Mother   . Diabetes Brother   . Colon cancer Brother        Diagnosed > age 79    Social History   Tobacco Use  . Smoking status: Never Smoker  . Smokeless tobacco: Never Used  Vaping Use  . Vaping Use: Never used  Substance Use Topics  . Alcohol use: No  . Drug use: No    Medications: I have reviewed the patient's current medications. Allergies as of 09/12/2020      Reactions   Bee Venom Anaphylaxis   Promethazine Swelling   Tenofovir Disoproxil Fumarate [tenofovir Disoproxil Fumarate] Other (See Comments)   Renal failure, (08/27/2006)   Compazine  [prochlorperazine Edisylate] Nausea And Vomiting   Haloperidol Lactate Other (See Comments)   Reaction is muscle tension, causes severe spasms in  face and neck. Forces eyes to roll in the back of the head   Brompheniramine-acetaminophen    Chlorcyclizine    Chlorpromazine    Codeine Itching, Nausea And Vomiting   Navane [thiothixene] Other (See Comments), Nausea And Vomiting   Same muscle spasm reaction as haldol   Prednisone    Brief mild psychosis and agitation   Promethazine Hcl Other (See Comments)   Propoxyphene N-acetaminophen Itching, Nausea And Vomiting   Morphine Itching, Swelling      Medication List       Accurate as of September 12, 2020  3:32 PM. If you have any questions, ask your nurse or doctor.        albuterol 108 (90 Base) MCG/ACT inhaler Commonly known as: VENTOLIN HFA Inhale 1-2 puffs into the lungs every 6 (six) hours as needed for wheezing or shortness of breath.   Benadryl 25 mg capsule Generic drug: diphenhydrAMINE Take 25 mg by mouth daily as needed for itching (itching from codeine).   docusate sodium 100 MG capsule Commonly  known as: COLACE Take 1 capsule (100 mg total) by mouth 2 (two) times daily.   Fish Oil Burp-Less 1000 MG Caps Take 1 capsule by mouth daily.   fluticasone 50 MCG/ACT nasal spray Commonly known as: FLONASE 2 SPRAYS INTO BOTH NOSTRILS AT BEDTIME. What changed: See the new instructions.   furosemide 40 MG tablet Commonly known as: LASIX Take 1 tablet (40 mg total) by mouth daily as needed for fluid. Hold until follow up with PCP   ipratropium-albuterol 0.5-2.5 (3) MG/3ML Soln Commonly known as: DUONEB Inhale 3 mLs into the lungs every 6 (six) hours as needed (shortness of breath).   LORazepam 0.5 MG tablet Commonly known as: ATIVAN Take 0.5 mg by mouth 2 (two) times daily.   magnesium 30 MG tablet Take 30 mg by mouth at bedtime.   nebivolol 5 MG tablet Commonly known as: Bystolic Take 1 tablet (5 mg total) by mouth daily.   ondansetron 8 MG tablet Commonly known as: ZOFRAN Take 8 mg by mouth daily as needed for nausea or vomiting.   ONE-A-DAY 55 PLUS  PO Take 1 tablet by mouth daily.   Oxycodone HCl 10 MG Tabs Take 10 mg by mouth 2 (two) times daily as needed (for pain).   pantoprazole 40 MG tablet Commonly known as: Protonix Take 1 tablet (40 mg total) by mouth daily before breakfast.   polyvinyl alcohol 1.4 % ophthalmic solution Commonly known as: LIQUIFILM TEARS Place 1 drop into both eyes daily.   thiamine 100 MG tablet Commonly known as: Vitamin B-1 Take 100 mg by mouth daily.   Triumeq 600-50-300 MG tablet Generic drug: abacavir-dolutegravir-lamiVUDine Take 1 tablet by mouth daily.   Vitamin D3 250 MCG (10000 UT) Tabs Take 1 tablet by mouth daily.        ROS:  A comprehensive review of systems was negative except for: Ears, nose, mouth, throat, and face: positive for superficial mass left neck, possible lipoma Gastrointestinal: positive for abdominal pain and hernia midline and parastomal, causing some discomfort, ostomy in place Behavioral/Psych: positive for depressed about ostomy and bag Allergic/Immunologic: positive for HIV on medication and controlled  Blood pressure (!) 143/77, pulse (!) 46, temperature 98.4 F (36.9 C), temperature source Oral, resp. rate 16, height 5\' 2"  (1.575 m), weight 165 lb (74.8 kg), SpO2 98 %. Physical Exam Vitals reviewed.  Constitutional:      Appearance: Normal appearance.  HENT:     Head: Normocephalic.     Nose: Nose normal.     Mouth/Throat:     Mouth: Mucous membranes are moist.  Eyes:     Extraocular Movements: Extraocular movements intact.  Neck:     Comments: Left neck above clavicle superficial fatty area asymmetrical from the right side, no adenopathy Cardiovascular:     Rate and Rhythm: Normal rate and regular rhythm.  Pulmonary:     Effort: Pulmonary effort is normal.  Abdominal:     General: There is no distension.     Palpations: Abdomen is soft.     Tenderness: There is no abdominal tenderness.     Hernia: A hernia is present.     Comments: Midline  defect 3cm and bulging around ostomy, ostomy with bag and stool in bag  Musculoskeletal:        General: No swelling. Normal range of motion.     Cervical back: Normal range of motion. No rigidity.  Skin:    General: Skin is warm.  Neurological:     General: No  focal deficit present.     Mental Status: She is alert and oriented to person, place, and time.  Psychiatric:        Mood and Affect: Mood normal.        Behavior: Behavior normal.        Thought Content: Thought content normal.        Judgment: Judgment normal.     Results: CT 05/2020 with midline hernia about 4cm and parastomal hernia noted   Assessment & Plan:  ANABETH CHILCOTT is a 79 y.o. female with a colostomy in place and hernias who wants to be reversed. She is doing well otherwise and colonoscopy was done and cardiac clearance was completed. She is taking her HIV meds and is controlled. She is overall well.   -Discussed colostomy reversal and risk of bleeding, infection, anastomotic leak, injury to other organs / ureter, and ventral hernia risk after surgery  -Discussed possible need for blood and risk  -Discussed preop COVID testing  -Discussed operative course and stay of about 4-5 days on average  -Will also get a US of the left neck to look at the lipoma   Colon Preparation:  Buy from the Store: Miralax bottle (288g).  Gatorade 64 oz (not red). Dulcolax tablets.   The Day Prior to Surgery: Take 4 ducolax tablets at 7am with water. Do an enema through your rectum at 8AM. Drink plenty of clear liquids all day to avoid dehydration, no solid food.    Mix the bottle of Miralax and 64 oz of Gatorade and drink this mixture starting at 10am.  Drink it gradually over the next few hours, 8 ounces every 15-30 minutes until it is gone. Finish this by 2pm.  Repeat an enema at 2pm.  Take 2 neomycin 500mg  tablets and 2 metronidazole 500mg  tablets at 2 pm. Take 2 neomycin 500mg  tablets and 2 metronidazole 500mg  tablets  at 3pm. Take 2 neomycin 500mg  tablets and 2 metronidazole 500mg  tablets at 10pm.    Do not eat or drink anything after midnight the night before your surgery.  Do not eat or drink anything that morning, and take medications as instructed by the hospital staff on your preoperative visit.    All questions were answered to the satisfaction of the patient.    [name] 09/12/2020, 3:32 PM

## 2020-09-24 ENCOUNTER — Ambulatory Visit (HOSPITAL_COMMUNITY): Payer: Medicare Other

## 2020-09-25 NOTE — Patient Instructions (Signed)
[name]  09/25/2020     @PREFPERIOPPHARMACY @   Your procedure is scheduled on  10/02/2020.  Report to [facility] at  0700  A.M.  Call this number if you have problems the morning of surgery:  [phone]   Remember:  Follow the enclosed diet and prep instructions from Dr Constance Haw.                    Take these medicines the morning of surgery with A SIP OF WATER abacavir, ativan(if needed), bystolic, zofran(if needed), oxycodone(if needed), protonix. Use your nebulizer and your inhalers before you come. Bring your rescue inhaler with you.    Do not wear jewelry, make-up or nail polish.  Do not wear lotions, powders, or perfumes. Please wear deodorant and brush your teeth.  Do not shave 48 hours prior to surgery.  Men may shave face and neck.  Do not bring valuables to the hospital.  [facility] is not responsible for any belongings or valuables.  Contacts, dentures or bridgework may not be worn into surgery.  Leave your suitcase in the car.  After surgery it may be brought to your room.  For patients admitted to the hospital, discharge time will be determined by your treatment team.  Patients discharged the day of surgery will not be allowed to drive home.   Name and phone number of your driver:   family Special instructions:  DO NOT smoke the morning of your procedure.  Please read over the following fact sheets that you were given. Pain Booklet, Coughing and Deep Breathing, Blood Transfusion Information, Surgical Site Infection Prevention, Anesthesia Post-op Instructions and Care and Recovery After Surgery       Colostomy Reversal Surgery, Care After This sheet gives you information about how to care for yourself after your procedure. Your health care provider may also give you more specific instructions. If you have problems or questions, contact your health care provider. What can I expect after the procedure? After the procedure, it is common to  have:  Pain and discomfort in your abdomen, especially near your incision.  Loose stools (diarrhea).  Decreased appetite.  Constipation. Follow these instructions at home: Activity   Do not lift anything that is heavier than 10 lb (4.5 kg), or the limit that you are told, until your health care provider says that it is safe.  Return to your normal activities as told by your health care provider. Ask your health care provider what activities are safe for you.  Avoid sitting for a long time without moving. Get up to take short walks every 1-2 hours. This is important to improve blood flow and breathing. Ask for help if you feel weak or unsteady.  Avoid strenuous activity, contact sports, and abdominal exercises for 4 weeks or as long as told by your health care provider. Incision care   Follow instructions from your health care provider about how to take care of your incision. Make sure you: ? Wash your hands with soap and water before and after you change your bandage (dressing). If soap and water are not available, use hand sanitizer. ? Change your dressing as told by your health care provider. ? Leave stitches (sutures), skin glue, or adhesive strips in place. These skin closures may need to stay in place for 2 weeks or longer. If adhesive strip edges start to loosen and curl up, you may trim the loose edges. Do not  remove adhesive strips completely unless your health care provider tells you to do that.  Keep the incision area clean and dry.  Check your incision area every day for signs of infection. Check for: ? More redness, swelling, or pain. ? More fluid or blood. ? Warmth. ? Pus or a bad smell. Bathing  Do not take baths, swim, or use a hot tub until your health care provider approves. Ask your health care provider if you may take showers. You may only be allowed to take sponge baths.  If your health care provider approves bathing and showering, cover the dressing with a  watertight covering to protect it from water. Do not let the dressing get wet.  Keep the dressing dry until your health care provider says it can be removed. Driving  Do not drive for 24 hours if you were given a sedative during your procedure. Follow other driving restrictions as told by your health care provider.  Do not drive or use heavy machinery while taking prescription pain medicine. Eating and drinking  Follow instructions from your health care provider about eating or drinking restrictions. This may include: ? What to eat and drink. You may be told to start eating a bland diet. Over time, you may slowly resume a more normal, healthy diet. ? How much to eat and drink. You should eat small meals often and stop eating when you feel full.  Take nutrition supplements as told by your health care provider or dietitian. General instructions  Take over-the-counter and prescription medicines only as told by your health care provider.  Take steps to treat diarrhea or constipation as told by your health care provider. Your health care provider may recommend that you: ? Drink enough fluid to keep your urine pale yellow. Avoid fluids that contain a lot of sugar or caffeine, such as energy drinks, sports drinks, and soda. ? Eat bland, easy-to-digest foods in small amounts as you are able. These foods include bananas, applesauce, rice, lean meats, toast, and crackers. ? Take over-the-counter or prescription medicines. ? Limit foods that are high in fat and processed sugars, such as fried or sweet foods.  Do not use any products that contain nicotine or tobacco, such as cigarettes, e-cigarettes, and chewing tobacco. These can delay incision healing after surgery. If you need help quitting, ask your health care provider.  Keep all follow-up visits as told by your health care provider. This is important. Contact a health care provider if:  You have more redness, swelling, or pain at the site of  your incision.  You have more fluid or blood coming from your incision.  Your incision feels warm to the touch.  You have pus or a bad smell coming from your incision.  You have a fever.  Your incision breaks open.  You feel nauseous.  You are not able to have a bowel movement (are constipated).  Your diarrhea gets worse.  You have pain that is not controlled with medicine. Get help right away if you have:  Abdominal pain that does not go away or becomes severe.  Frequent vomiting and you are not able to eat or drink.  Difficulty breathing. Summary  After colostomy reversal surgery, it is common to have abdominal pain, decreased appetite, diarrhea, or constipation.  Follow instructions from your health care provider about how to take care of your incision. Do not let the dressing get wet.  Take over-the-counter and prescription medicines only as told by your health care provider.  Contact your health care provider if you are not able to have a bowel movement (are constipated).  Keep all follow-up visits as told by your health care provider. This is important. This information is not intended to replace advice given to you by your health care provider. Make sure you discuss any questions you have with your health care provider. Document Revised: 03/30/2018 Document Reviewed: 03/30/2018 Elsevier Patient Education  2020 [place] Anesthesia, Adult, Care After This sheet gives you information about how to care for yourself after your procedure. Your health care provider may also give you more specific instructions. If you have problems or questions, contact your health care provider. What can I expect after the procedure? After the procedure, the following side effects are common:  Pain or discomfort at the IV site.  Nausea.  Vomiting.  Sore throat.  Trouble concentrating.  Feeling cold or chills.  Weak or tired.  Sleepiness and  fatigue.  Soreness and body aches. These side effects can affect parts of the body that were not involved in surgery. Follow these instructions at home:  For at least 24 hours after the procedure:  Have a responsible adult stay with you. It is important to have someone help care for you until you are awake and alert.  Rest as needed.  Do not: ? Participate in activities in which you could fall or become injured. ? Drive. ? Use heavy machinery. ? Drink alcohol. ? Take sleeping pills or medicines that cause drowsiness. ? Make important decisions or sign legal documents. ? Take care of children on your own. Eating and drinking  Follow any instructions from your health care provider about eating or drinking restrictions.  When you feel hungry, start by eating small amounts of foods that are soft and easy to digest (bland), such as toast. Gradually return to your regular diet.  Drink enough fluid to keep your urine pale yellow.  If you vomit, rehydrate by drinking water, juice, or clear broth. General instructions  If you have sleep apnea, surgery and certain medicines can increase your risk for breathing problems. Follow instructions from your health care provider about wearing your sleep device: ? Anytime you are sleeping, including during daytime naps. ? While taking prescription pain medicines, sleeping medicines, or medicines that make you drowsy.  Return to your normal activities as told by your health care provider. Ask your health care provider what activities are safe for you.  Take over-the-counter and prescription medicines only as told by your health care provider.  If you smoke, do not smoke without supervision.  Keep all follow-up visits as told by your health care provider. This is important. Contact a health care provider if:  You have nausea or vomiting that does not get better with medicine.  You cannot eat or drink without vomiting.  You have pain that  does not get better with medicine.  You are unable to pass urine.  You develop a skin rash.  You have a fever.  You have redness around your IV site that gets worse. Get help right away if:  You have difficulty breathing.  You have chest pain.  You have blood in your urine or stool, or you vomit blood. Summary  After the procedure, it is common to have a sore throat or nausea. It is also common to feel tired.  Have a responsible adult stay with you for the first 24 hours after general anesthesia. It is important to have someone help  care for you until you are awake and alert.  When you feel hungry, start by eating small amounts of foods that are soft and easy to digest (bland), such as toast. Gradually return to your regular diet.  Drink enough fluid to keep your urine pale yellow.  Return to your normal activities as told by your health care provider. Ask your health care provider what activities are safe for you. This information is not intended to replace advice given to you by your health care provider. Make sure you discuss any questions you have with your health care provider. Document Revised: 10/15/2017 Document Reviewed: 05/28/2017 Elsevier Patient Education  [place]. How to Use Chlorhexidine for Bathing Chlorhexidine gluconate (CHG) is a germ-killing (antiseptic) solution that is used to clean the skin. It can get rid of the bacteria that normally live on the skin and can keep them away for about 24 hours. To clean your skin with CHG, you may be given:  A CHG solution to use in the shower or as part of a sponge bath.  A prepackaged cloth that contains CHG. Cleaning your skin with CHG may help lower the risk for infection:  While you are staying in the intensive care unit of the hospital.  If you have a vascular access, such as a central line, to provide short-term or long-term access to your veins.  If you have a catheter to drain urine from your  bladder.  If you are on a ventilator. A ventilator is a machine that helps you breathe by moving air in and out of your lungs.  After surgery. What are the risks? Risks of using CHG include:  A skin reaction.  Hearing loss, if CHG gets in your ears.  Eye injury, if CHG gets in your eyes and is not rinsed out.  The CHG product catching fire. Make sure that you avoid smoking and flames after applying CHG to your skin. Do not use CHG:  If you have a chlorhexidine allergy or have previously reacted to chlorhexidine.  On babies younger than 64 months of age. How to use CHG solution  Use CHG only as told by your health care provider, and follow the instructions on the label.  Use the full amount of CHG as directed. Usually, this is one bottle. During a shower Follow these steps when using CHG solution during a shower (unless your health care provider gives you different instructions): 1. Start the shower. 2. Use your normal soap and shampoo to wash your face and hair. 3. Turn off the shower or move out of the shower stream. 4. Pour the CHG onto a clean washcloth. Do not use any type of brush or rough-edged sponge. 5. Starting at your neck, lather your body down to your toes. Make sure you follow these instructions: ? If you will be having surgery, pay special attention to the part of your body where you will be having surgery. Scrub this area for at least 1 minute. ? Do not use CHG on your head or face. If the solution gets into your ears or eyes, rinse them well with water. ? Avoid your genital area. ? Avoid any areas of skin that have broken skin, cuts, or scrapes. ? Scrub your back and under your arms. Make sure to wash skin folds. 6. Let the lather sit on your skin for 1-2 minutes or as long as told by your health care provider. 7. Thoroughly rinse your entire body in the shower. Make  sure that all body creases and crevices are rinsed well. 8. Dry off with a clean towel. Do not  put any substances on your body afterward--such as powder, lotion, or perfume--unless you are told to do so by your health care provider. Only use lotions that are recommended by the manufacturer. 9. Put on clean clothes or pajamas. 10. If it is the night before your surgery, sleep in clean sheets.  During a sponge bath Follow these steps when using CHG solution during a sponge bath (unless your health care provider gives you different instructions): 1. Use your normal soap and shampoo to wash your face and hair. 2. Pour the CHG onto a clean washcloth. 3. Starting at your neck, lather your body down to your toes. Make sure you follow these instructions: ? If you will be having surgery, pay special attention to the part of your body where you will be having surgery. Scrub this area for at least 1 minute. ? Do not use CHG on your head or face. If the solution gets into your ears or eyes, rinse them well with water. ? Avoid your genital area. ? Avoid any areas of skin that have broken skin, cuts, or scrapes. ? Scrub your back and under your arms. Make sure to wash skin folds. 4. Let the lather sit on your skin for 1-2 minutes or as long as told by your health care provider. 5. Using a different clean, wet washcloth, thoroughly rinse your entire body. Make sure that all body creases and crevices are rinsed well. 6. Dry off with a clean towel. Do not put any substances on your body afterward--such as powder, lotion, or perfume--unless you are told to do so by your health care provider. Only use lotions that are recommended by the manufacturer. 7. Put on clean clothes or pajamas. 8. If it is the night before your surgery, sleep in clean sheets. How to use CHG prepackaged cloths  Only use CHG cloths as told by your health care provider, and follow the instructions on the label.  Use the CHG cloth on clean, dry skin.  Do not use the CHG cloth on your head or face unless your health care provider  tells you to.  When washing with the CHG cloth: ? Avoid your genital area. ? Avoid any areas of skin that have broken skin, cuts, or scrapes. Before surgery Follow these steps when using a CHG cloth to clean before surgery (unless your health care provider gives you different instructions): 1. Using the CHG cloth, vigorously scrub the part of your body where you will be having surgery. Scrub using a back-and-forth motion for 3 minutes. The area on your body should be completely wet with CHG when you are done scrubbing. 2. Do not rinse. Discard the cloth and let the area air-dry. Do not put any substances on the area afterward, such as powder, lotion, or perfume. 3. Put on clean clothes or pajamas. 4. If it is the night before your surgery, sleep in clean sheets.  For general bathing Follow these steps when using CHG cloths for general bathing (unless your health care provider gives you different instructions). 1. Use a separate CHG cloth for each area of your body. Make sure you wash between any folds of skin and between your fingers and toes. Wash your body in the following order, switching to a new cloth after each step: ? The front of your neck, shoulders, and chest. ? Both of your arms, under your  arms, and your hands. ? Your stomach and groin area, avoiding the genitals. ? Your right leg and foot. ? Your left leg and foot. ? The back of your neck, your back, and your buttocks. 2. Do not rinse. Discard the cloth and let the area air-dry. Do not put any substances on your body afterward--such as powder, lotion, or perfume--unless you are told to do so by your health care provider. Only use lotions that are recommended by the manufacturer. 3. Put on clean clothes or pajamas. Contact a health care provider if:  Your skin gets irritated after scrubbing.  You have questions about using your solution or cloth. Get help right away if:  Your eyes become very red or swollen.  Your eyes  itch badly.  Your skin itches badly and is red or swollen.  Your hearing changes.  You have trouble seeing.  You have swelling or tingling in your mouth or throat.  You have trouble breathing.  You swallow any chlorhexidine. Summary  Chlorhexidine gluconate (CHG) is a germ-killing (antiseptic) solution that is used to clean the skin. Cleaning your skin with CHG may help to lower your risk for infection.  You may be given CHG to use for bathing. It may be in a bottle or in a prepackaged cloth to use on your skin. Carefully follow your health care provider's instructions and the instructions on the product label.  Do not use CHG if you have a chlorhexidine allergy.  Contact your health care provider if your skin gets irritated after scrubbing. This information is not intended to replace advice given to you by your health care provider. Make sure you discuss any questions you have with your health care provider. Document Revised: 12/29/2018 Document Reviewed: 09/09/2017 Elsevier Patient Education  [place].

## 2020-09-30 ENCOUNTER — Encounter (HOSPITAL_COMMUNITY): Payer: Self-pay

## 2020-09-30 ENCOUNTER — Other Ambulatory Visit: Payer: Self-pay

## 2020-09-30 ENCOUNTER — Telehealth: Payer: Self-pay | Admitting: Cardiology

## 2020-09-30 ENCOUNTER — Encounter (HOSPITAL_COMMUNITY)
Admission: RE | Admit: 2020-09-30 | Discharge: 2020-09-30 | Disposition: A | Discharge status: Home / Self Care | Payer: Medicare Other | Source: Ambulatory Visit | Attending: General Surgery | Admitting: General Surgery

## 2020-09-30 ENCOUNTER — Other Ambulatory Visit (HOSPITAL_COMMUNITY)
Admission: RE | Admit: 2020-09-30 | Discharge: 2020-09-30 | Disposition: A | Discharge status: Home / Self Care | Payer: Medicare Other | Source: Ambulatory Visit | Attending: General Surgery | Admitting: General Surgery

## 2020-09-30 DIAGNOSIS — Z20822 Contact with and (suspected) exposure to covid-19: Secondary | ICD-10-CM | POA: Insufficient documentation

## 2020-09-30 DIAGNOSIS — Z01812 Encounter for preprocedural laboratory examination: Secondary | ICD-10-CM | POA: Insufficient documentation

## 2020-09-30 HISTORY — DX: Other complications of anesthesia, initial encounter: T88.59XA

## 2020-09-30 LAB — CBC WITH DIFFERENTIAL/PLATELET
Abs Immature Granulocytes: 0.02 10*3/uL (ref 0.00–0.07)
Basophils Absolute: 0 10*3/uL (ref 0.0–0.1)
Basophils Relative: 0 %
Eosinophils Absolute: 0.1 10*3/uL (ref 0.0–0.5)
Eosinophils Relative: 1 %
HCT: 41.7 % (ref 36.0–46.0)
Hemoglobin: 14.1 g/dL (ref 12.0–15.0)
Immature Granulocytes: 0 %
Lymphocytes Relative: 29 %
Lymphs Abs: 2 10*3/uL (ref 0.7–4.0)
MCH: 35.9 pg — ABNORMAL HIGH (ref 26.0–34.0)
MCHC: 33.8 g/dL (ref 30.0–36.0)
MCV: 106.1 fL — ABNORMAL HIGH (ref 80.0–100.0)
Monocytes Absolute: 0.6 10*3/uL (ref 0.1–1.0)
Monocytes Relative: 9 %
Neutro Abs: 4.3 10*3/uL (ref 1.7–7.7)
Neutrophils Relative %: 61 %
Platelets: 184 10*3/uL (ref 150–400)
RBC: 3.93 MIL/uL (ref 3.87–5.11)
RDW: 12.5 % (ref 11.5–15.5)
WBC: 7.1 10*3/uL (ref 4.0–10.5)
nRBC: 0 % (ref 0.0–0.2)

## 2020-09-30 LAB — BASIC METABOLIC PANEL
Anion gap: 8 (ref 5–15)
BUN: 34 mg/dL — ABNORMAL HIGH (ref 8–23)
CO2: 28 mmol/L (ref 22–32)
Calcium: 10.2 mg/dL (ref 8.9–10.3)
Chloride: 102 mmol/L (ref 98–111)
Creatinine, Ser: 1.4 mg/dL — ABNORMAL HIGH (ref 0.44–1.00)
GFR, Estimated: 38 mL/min — ABNORMAL LOW (ref >60)
Glucose, Bld: 110 mg/dL — ABNORMAL HIGH (ref 70–99)
Potassium: 4.9 mmol/L (ref 3.5–5.1)
Sodium: 138 mmol/L (ref 135–145)

## 2020-09-30 LAB — HEMOGLOBIN A1C
Hgb A1c MFr Bld: 4.9 % (ref 4.8–5.6)
Mean Plasma Glucose: 93.93 mg/dL

## 2020-09-30 LAB — SARS CORONAVIRUS 2 (TAT 6-24 HRS): SARS Coronavirus 2: NEGATIVE

## 2020-09-30 LAB — PREPARE RBC (CROSSMATCH)

## 2020-09-30 NOTE — Telephone Encounter (Signed)
New message   Patient came in for her preop appointment and they wanted surgical clearance ?     [facility] Pre-operative Risk Assessment    HEARTCARE STAFF: - Please ensure there is not already an duplicate clearance open for this procedure. - Under Visit Info/Reason for Call, type in Other and utilize the format Clearance MM/DD/YY or Clearance TBD. Do not use dashes or single digits. - If request is for dental extraction, please clarify the # of teeth to be extracted.  Request for surgical clearance:  1. What type of surgery is being performed? Reverse Colostomy   2. When is this surgery scheduled? This week   3. What type of clearance is required (medical clearance vs. Pharmacy clearance to hold med vs. Both)? Medical   4. Are there any medications that need to be held prior to surgery and how long? Did not say   5. Practice name and name of physician performing surgery? [facility]   What is the office phone number?  [phone].   What is the office fax number?   8.   Anesthesia type (None, local, MAC, general) ? ?  Jannet Askew 09/30/2020, 11:25 AM  _________________________________________________________________   (provider comments below)

## 2020-09-30 NOTE — Telephone Encounter (Signed)
Call and leave message at [facility] short stay, advised clearance is in Epic

## 2020-09-30 NOTE — Telephone Encounter (Signed)
Patient was felt to be OK for surgery at recent visit with Dr. Harl Bowie. Pre-op covering staff, can you please help get fax number to requesting surgeon's office?  Thank you!

## 2020-10-02 ENCOUNTER — Encounter (HOSPITAL_COMMUNITY): Payer: Self-pay | Admitting: General Surgery

## 2020-10-02 ENCOUNTER — Inpatient Hospital Stay (HOSPITAL_COMMUNITY): Payer: Medicare Other | Admitting: Anesthesiology

## 2020-10-02 ENCOUNTER — Other Ambulatory Visit: Payer: Self-pay

## 2020-10-02 ENCOUNTER — Inpatient Hospital Stay (HOSPITAL_COMMUNITY)
Admission: RE | Admit: 2020-10-02 | Discharge: 2020-10-08 | DRG: 330 | Disposition: A | Discharge status: Skilled Nursing Facility | Payer: Medicare Other | Attending: General Surgery | Admitting: General Surgery

## 2020-10-02 ENCOUNTER — Encounter (HOSPITAL_COMMUNITY)
Admission: RE | Disposition: A | Discharge status: Skilled Nursing Facility | Payer: Self-pay | Source: Home / Self Care | Attending: General Surgery

## 2020-10-02 DIAGNOSIS — Z833 Family history of diabetes mellitus: Secondary | ICD-10-CM | POA: Diagnosis not present

## 2020-10-02 DIAGNOSIS — Z20822 Contact with and (suspected) exposure to covid-19: Secondary | ICD-10-CM | POA: Diagnosis present

## 2020-10-02 DIAGNOSIS — B20 Human immunodeficiency virus [HIV] disease: Secondary | ICD-10-CM | POA: Diagnosis present

## 2020-10-02 DIAGNOSIS — I2510 Atherosclerotic heart disease of native coronary artery without angina pectoris: Secondary | ICD-10-CM | POA: Diagnosis present

## 2020-10-02 DIAGNOSIS — Z433 Encounter for attention to colostomy: Secondary | ICD-10-CM | POA: Diagnosis present

## 2020-10-02 DIAGNOSIS — Z91030 Bee allergy status: Secondary | ICD-10-CM

## 2020-10-02 DIAGNOSIS — Z888 Allergy status to other drugs, medicaments and biological substances status: Secondary | ICD-10-CM

## 2020-10-02 DIAGNOSIS — Z79899 Other long term (current) drug therapy: Secondary | ICD-10-CM | POA: Diagnosis not present

## 2020-10-02 DIAGNOSIS — F32A Depression, unspecified: Secondary | ICD-10-CM | POA: Diagnosis present

## 2020-10-02 DIAGNOSIS — K5790 Diverticulosis of intestine, part unspecified, without perforation or abscess without bleeding: Secondary | ICD-10-CM | POA: Diagnosis present

## 2020-10-02 DIAGNOSIS — K435 Parastomal hernia without obstruction or  gangrene: Secondary | ICD-10-CM | POA: Diagnosis present

## 2020-10-02 DIAGNOSIS — Z933 Colostomy status: Secondary | ICD-10-CM | POA: Diagnosis not present

## 2020-10-02 DIAGNOSIS — I129 Hypertensive chronic kidney disease with stage 1 through stage 4 chronic kidney disease, or unspecified chronic kidney disease: Secondary | ICD-10-CM | POA: Diagnosis present

## 2020-10-02 DIAGNOSIS — G629 Polyneuropathy, unspecified: Secondary | ICD-10-CM | POA: Diagnosis present

## 2020-10-02 DIAGNOSIS — Z800 Family history of malignant neoplasm of digestive organs: Secondary | ICD-10-CM | POA: Diagnosis not present

## 2020-10-02 DIAGNOSIS — K660 Peritoneal adhesions (postprocedural) (postinfection): Secondary | ICD-10-CM | POA: Diagnosis present

## 2020-10-02 DIAGNOSIS — J449 Chronic obstructive pulmonary disease, unspecified: Secondary | ICD-10-CM | POA: Diagnosis present

## 2020-10-02 DIAGNOSIS — N183 Chronic kidney disease, stage 3 unspecified: Secondary | ICD-10-CM | POA: Diagnosis present

## 2020-10-02 HISTORY — PX: COLOSTOMY REVERSAL: SHX5782

## 2020-10-02 SURGERY — COLOSTOMY REVERSAL
Anesthesia: General

## 2020-10-02 MED ORDER — SODIUM CHLORIDE 0.9 % IV SOLN
2.0000 g | INTRAVENOUS | Status: AC
  Administered 2020-10-02: 2 g via INTRAVENOUS

## 2020-10-02 MED ORDER — CHLORHEXIDINE GLUCONATE CLOTH 2 % EX PADS
6.0000 | MEDICATED_PAD | Freq: Every day | CUTANEOUS | Status: DC
  Administered 2020-10-03 – 2020-10-08 (×6): 6 via TOPICAL

## 2020-10-02 MED ORDER — SUCCINYLCHOLINE CHLORIDE 20 MG/ML IJ SOLN
INTRAMUSCULAR | Status: DC | PRN
  Administered 2020-10-02: 120 mg via INTRAVENOUS

## 2020-10-02 MED ORDER — DIPHENHYDRAMINE HCL 12.5 MG/5ML PO ELIX: 12.5000 mg | ORAL_SOLUTION | Freq: Four times a day (QID) | ORAL | Status: DC | PRN

## 2020-10-02 MED ORDER — ALVIMOPAN 12 MG PO CAPS
ORAL_CAPSULE | ORAL | Status: AC
  Filled 2020-10-02: qty 1

## 2020-10-02 MED ORDER — DEXAMETHASONE SODIUM PHOSPHATE 10 MG/ML IJ SOLN
INTRAMUSCULAR | Status: AC
  Filled 2020-10-02: qty 1

## 2020-10-02 MED ORDER — FENTANYL CITRATE (PF) 250 MCG/5ML IJ SOLN
INTRAMUSCULAR | Status: AC
  Filled 2020-10-02: qty 5

## 2020-10-02 MED ORDER — PHENYLEPHRINE HCL (PRESSORS) 10 MG/ML IV SOLN
INTRAVENOUS | Status: DC | PRN
  Administered 2020-10-02 (×2): 40 ug via INTRAVENOUS
  Administered 2020-10-02 (×2): 100 ug via INTRAVENOUS

## 2020-10-02 MED ORDER — OXYCODONE HCL 5 MG PO TABS
5.0000 mg | ORAL_TABLET | ORAL | Status: DC | PRN
  Administered 2020-10-02 – 2020-10-03 (×2): 5 mg via ORAL
  Administered 2020-10-03 – 2020-10-07 (×3): 10 mg via ORAL
  Filled 2020-10-02 (×3): qty 2
  Filled 2020-10-02 (×2): qty 1
  Filled 2020-10-02: qty 2

## 2020-10-02 MED ORDER — HYDROMORPHONE HCL 1 MG/ML IJ SOLN: 0.5000 mg | INTRAMUSCULAR | Status: DC | PRN

## 2020-10-02 MED ORDER — DOCUSATE SODIUM 100 MG PO CAPS
100.0000 mg | ORAL_CAPSULE | Freq: Two times a day (BID) | ORAL | Status: DC
  Administered 2020-10-02 – 2020-10-08 (×7): 100 mg via ORAL
  Filled 2020-10-02 (×10): qty 1

## 2020-10-02 MED ORDER — ZOLPIDEM TARTRATE 5 MG PO TABS
5.0000 mg | ORAL_TABLET | Freq: Every evening | ORAL | Status: DC | PRN
  Filled 2020-10-02: qty 1

## 2020-10-02 MED ORDER — THIAMINE HCL 100 MG PO TABS
100.0000 mg | ORAL_TABLET | Freq: Every day | ORAL | Status: DC
  Administered 2020-10-02 – 2020-10-08 (×7): 100 mg via ORAL
  Filled 2020-10-02 (×10): qty 1

## 2020-10-02 MED ORDER — PANTOPRAZOLE SODIUM 40 MG PO TBEC
40.0000 mg | DELAYED_RELEASE_TABLET | Freq: Every day | ORAL | Status: DC
  Administered 2020-10-03 – 2020-10-08 (×6): 40 mg via ORAL
  Filled 2020-10-02 (×6): qty 1

## 2020-10-02 MED ORDER — ENSURE PRE-SURGERY PO LIQD
296.0000 mL | Freq: Once | ORAL | Status: DC
  Filled 2020-10-02: qty 296

## 2020-10-02 MED ORDER — FLUTICASONE PROPIONATE 50 MCG/ACT NA SUSP
2.0000 | Freq: Every day | NASAL | Status: DC
  Administered 2020-10-02: 2 via NASAL
  Filled 2020-10-02 (×2): qty 16

## 2020-10-02 MED ORDER — ACETAMINOPHEN 500 MG PO TABS
1000.0000 mg | ORAL_TABLET | ORAL | Status: AC
  Administered 2020-10-02: 1000 mg via ORAL

## 2020-10-02 MED ORDER — ACETAMINOPHEN 500 MG PO TABS
1000.0000 mg | ORAL_TABLET | Freq: Four times a day (QID) | ORAL | Status: DC
  Administered 2020-10-03 – 2020-10-08 (×16): 1000 mg via ORAL
  Filled 2020-10-02 (×17): qty 2

## 2020-10-02 MED ORDER — FENTANYL CITRATE (PF) 100 MCG/2ML IJ SOLN: 25.0000 ug | INTRAMUSCULAR | Status: DC | PRN

## 2020-10-02 MED ORDER — ALVIMOPAN 12 MG PO CAPS
12.0000 mg | ORAL_CAPSULE | ORAL | Status: AC
  Administered 2020-10-02: 12 mg via ORAL

## 2020-10-02 MED ORDER — CHLORHEXIDINE GLUCONATE 0.12 % MT SOLN
OROMUCOSAL | Status: AC
  Filled 2020-10-02: qty 60

## 2020-10-02 MED ORDER — ROCURONIUM BROMIDE 100 MG/10ML IV SOLN
INTRAVENOUS | Status: DC | PRN
  Administered 2020-10-02: 30 mg via INTRAVENOUS
  Administered 2020-10-02: 70 mg via INTRAVENOUS

## 2020-10-02 MED ORDER — ACETAMINOPHEN 500 MG PO TABS
ORAL_TABLET | ORAL | Status: AC
  Filled 2020-10-02: qty 2

## 2020-10-02 MED ORDER — SIMETHICONE 80 MG PO CHEW: 40.0000 mg | CHEWABLE_TABLET | Freq: Four times a day (QID) | ORAL | Status: DC | PRN

## 2020-10-02 MED ORDER — PHENYLEPHRINE 40 MCG/ML (10ML) SYRINGE FOR IV PUSH (FOR BLOOD PRESSURE SUPPORT)
PREFILLED_SYRINGE | INTRAVENOUS | Status: AC
  Filled 2020-10-02: qty 10

## 2020-10-02 MED ORDER — DIPHENHYDRAMINE HCL 50 MG/ML IJ SOLN: 12.5000 mg | Freq: Four times a day (QID) | INTRAMUSCULAR | Status: DC | PRN

## 2020-10-02 MED ORDER — SODIUM CHLORIDE 0.9 % IV SOLN
2.0000 g | Freq: Two times a day (BID) | INTRAVENOUS | Status: AC
  Administered 2020-10-02 – 2020-10-03 (×3): 2 g via INTRAVENOUS
  Filled 2020-10-02 (×3): qty 2

## 2020-10-02 MED ORDER — LACTATED RINGERS IV SOLN: INTRAVENOUS | Status: DC

## 2020-10-02 MED ORDER — ONDANSETRON HCL 4 MG/2ML IJ SOLN
INTRAMUSCULAR | Status: DC | PRN
  Administered 2020-10-02: 4 mg via INTRAVENOUS

## 2020-10-02 MED ORDER — FLEET ENEMA 7-19 GM/118ML RE ENEM
1.0000 | ENEMA | Freq: Once | RECTAL | Status: AC
  Administered 2020-10-02: 1 via RECTAL

## 2020-10-02 MED ORDER — SUCCINYLCHOLINE CHLORIDE 200 MG/10ML IV SOSY
PREFILLED_SYRINGE | INTRAVENOUS | Status: AC
  Filled 2020-10-02: qty 10

## 2020-10-02 MED ORDER — LORAZEPAM 0.5 MG PO TABS
0.5000 mg | ORAL_TABLET | Freq: Two times a day (BID) | ORAL | Status: DC
  Administered 2020-10-02 – 2020-10-08 (×12): 0.5 mg via ORAL
  Filled 2020-10-02 (×13): qty 1

## 2020-10-02 MED ORDER — ENSURE PRE-SURGERY PO LIQD
592.0000 mL | Freq: Once | ORAL | Status: DC
  Filled 2020-10-02: qty 592

## 2020-10-02 MED ORDER — ENSURE SURGERY PO LIQD
237.0000 mL | Freq: Two times a day (BID) | ORAL | Status: DC
  Administered 2020-10-03 – 2020-10-08 (×7): 237 mL via ORAL
  Filled 2020-10-02 (×14): qty 237

## 2020-10-02 MED ORDER — PROPOFOL 10 MG/ML IV BOLUS
INTRAVENOUS | Status: AC
  Filled 2020-10-02: qty 20

## 2020-10-02 MED ORDER — CHLORHEXIDINE GLUCONATE CLOTH 2 % EX PADS: 6.0000 | MEDICATED_PAD | Freq: Once | CUTANEOUS | Status: DC

## 2020-10-02 MED ORDER — ALBUTEROL SULFATE (2.5 MG/3ML) 0.083% IN NEBU: 3.0000 mL | INHALATION_SOLUTION | Freq: Four times a day (QID) | RESPIRATORY_TRACT | Status: DC | PRN

## 2020-10-02 MED ORDER — MEPERIDINE HCL 50 MG/ML IJ SOLN: 6.2500 mg | INTRAMUSCULAR | Status: DC | PRN

## 2020-10-02 MED ORDER — METOPROLOL TARTRATE 5 MG/5ML IV SOLN
5.0000 mg | Freq: Four times a day (QID) | INTRAVENOUS | Status: DC | PRN
  Filled 2020-10-02: qty 5

## 2020-10-02 MED ORDER — SODIUM CHLORIDE 0.9 % IR SOLN
Status: DC | PRN
  Administered 2020-10-02: 1000 mL

## 2020-10-02 MED ORDER — LIDOCAINE HCL (CARDIAC) PF 100 MG/5ML IV SOSY
PREFILLED_SYRINGE | INTRAVENOUS | Status: DC | PRN
  Administered 2020-10-02: 100 mg via INTRAVENOUS

## 2020-10-02 MED ORDER — DEXAMETHASONE SODIUM PHOSPHATE 4 MG/ML IJ SOLN
INTRAMUSCULAR | Status: DC | PRN
  Administered 2020-10-02: 4 mg via INTRAVENOUS

## 2020-10-02 MED ORDER — ONDANSETRON HCL 4 MG/2ML IJ SOLN: 4.0000 mg | Freq: Once | INTRAMUSCULAR | Status: DC | PRN

## 2020-10-02 MED ORDER — FENTANYL CITRATE (PF) 100 MCG/2ML IJ SOLN
INTRAMUSCULAR | Status: DC | PRN
  Administered 2020-10-02 (×5): 50 ug via INTRAVENOUS

## 2020-10-02 MED ORDER — METOCLOPRAMIDE HCL 5 MG/ML IJ SOLN
INTRAMUSCULAR | Status: DC | PRN
  Administered 2020-10-02: 10 mg via INTRAVENOUS

## 2020-10-02 MED ORDER — ONDANSETRON HCL 4 MG/2ML IJ SOLN
4.0000 mg | Freq: Four times a day (QID) | INTRAMUSCULAR | Status: DC | PRN
  Administered 2020-10-03 – 2020-10-06 (×4): 4 mg via INTRAVENOUS
  Filled 2020-10-02 (×4): qty 2

## 2020-10-02 MED ORDER — HEPARIN SODIUM (PORCINE) 5000 UNIT/ML IJ SOLN
INTRAMUSCULAR | Status: AC
  Filled 2020-10-02: qty 1

## 2020-10-02 MED ORDER — FUROSEMIDE 10 MG/ML IJ SOLN
20.0000 mg | Freq: Once | INTRAMUSCULAR | Status: AC
  Administered 2020-10-02: 20 mg via INTRAVENOUS
  Filled 2020-10-02: qty 4

## 2020-10-02 MED ORDER — LACTATED RINGERS IV SOLN: INTRAVENOUS | Status: DC | PRN

## 2020-10-02 MED ORDER — METOCLOPRAMIDE HCL 5 MG/ML IJ SOLN: 10.0000 mg | Freq: Four times a day (QID) | INTRAMUSCULAR | Status: DC | PRN

## 2020-10-02 MED ORDER — NEBIVOLOL HCL 10 MG PO TABS
5.0000 mg | ORAL_TABLET | Freq: Every day | ORAL | Status: DC
  Administered 2020-10-03 – 2020-10-08 (×6): 5 mg via ORAL
  Filled 2020-10-02 (×6): qty 1

## 2020-10-02 MED ORDER — ONDANSETRON HCL 4 MG/2ML IJ SOLN
INTRAMUSCULAR | Status: AC
  Filled 2020-10-02: qty 2

## 2020-10-02 MED ORDER — ONDANSETRON HCL 4 MG PO TABS: 4.0000 mg | ORAL_TABLET | Freq: Four times a day (QID) | ORAL | Status: DC | PRN

## 2020-10-02 MED ORDER — HEPARIN SODIUM (PORCINE) 5000 UNIT/ML IJ SOLN
5000.0000 [IU] | Freq: Three times a day (TID) | INTRAMUSCULAR | Status: DC
  Administered 2020-10-03 – 2020-10-08 (×17): 5000 [IU] via SUBCUTANEOUS
  Filled 2020-10-02 (×17): qty 1

## 2020-10-02 MED ORDER — METOCLOPRAMIDE HCL 5 MG/ML IJ SOLN
INTRAMUSCULAR | Status: AC
  Filled 2020-10-02: qty 2

## 2020-10-02 MED ORDER — ABACAVIR-DOLUTEGRAVIR-LAMIVUD 600-50-300 MG PO TABS
1.0000 | ORAL_TABLET | Freq: Every day | ORAL | Status: DC
  Administered 2020-10-03 – 2020-10-08 (×6): 1 via ORAL
  Filled 2020-10-02 (×7): qty 1

## 2020-10-02 MED ORDER — LACTATED RINGERS IV SOLN: Freq: Once | INTRAVENOUS | Status: AC

## 2020-10-02 MED ORDER — KETAMINE HCL 10 MG/ML IJ SOLN
INTRAMUSCULAR | Status: DC | PRN
  Administered 2020-10-02: 20 mg via INTRAVENOUS

## 2020-10-02 MED ORDER — SUGAMMADEX SODIUM 200 MG/2ML IV SOLN
INTRAVENOUS | Status: DC | PRN
  Administered 2020-10-02: 200 mg via INTRAVENOUS

## 2020-10-02 MED ORDER — PROPOFOL 10 MG/ML IV BOLUS
INTRAVENOUS | Status: DC | PRN
  Administered 2020-10-02: 100 mg via INTRAVENOUS

## 2020-10-02 MED ORDER — HEPARIN SODIUM (PORCINE) 5000 UNIT/ML IJ SOLN
5000.0000 [IU] | Freq: Once | INTRAMUSCULAR | Status: AC
  Administered 2020-10-02: 5000 [IU] via SUBCUTANEOUS

## 2020-10-02 MED ORDER — ALUM & MAG HYDROXIDE-SIMETH 200-200-20 MG/5ML PO SUSP: 30.0000 mL | Freq: Four times a day (QID) | ORAL | Status: DC | PRN

## 2020-10-02 MED ORDER — ORAL CARE MOUTH RINSE: 15.0000 mL | Freq: Once | OROMUCOSAL | Status: AC

## 2020-10-02 MED ORDER — CHLORHEXIDINE GLUCONATE 0.12 % MT SOLN
15.0000 mL | Freq: Once | OROMUCOSAL | Status: AC
  Administered 2020-10-02: 15 mL via OROMUCOSAL

## 2020-10-02 MED ORDER — ALVIMOPAN 12 MG PO CAPS
12.0000 mg | ORAL_CAPSULE | Freq: Two times a day (BID) | ORAL | Status: DC
  Administered 2020-10-03 – 2020-10-04 (×3): 12 mg via ORAL
  Filled 2020-10-02 (×5): qty 1

## 2020-10-02 MED ORDER — LIDOCAINE HCL (PF) 2 % IJ SOLN
INTRAMUSCULAR | Status: AC
  Filled 2020-10-02: qty 5

## 2020-10-02 MED ORDER — POLYVINYL ALCOHOL 1.4 % OP SOLN
1.0000 [drp] | Freq: Every day | OPHTHALMIC | Status: DC
  Administered 2020-10-02: 1 [drp] via OPHTHALMIC
  Filled 2020-10-02: qty 15

## 2020-10-02 MED ORDER — KETAMINE HCL 50 MG/5ML IJ SOSY
PREFILLED_SYRINGE | INTRAMUSCULAR | Status: AC
  Filled 2020-10-02: qty 5

## 2020-10-02 MED ORDER — ROCURONIUM BROMIDE 10 MG/ML (PF) SYRINGE
PREFILLED_SYRINGE | INTRAVENOUS | Status: AC
  Filled 2020-10-02: qty 10

## 2020-10-02 MED ORDER — IPRATROPIUM-ALBUTEROL 0.5-2.5 (3) MG/3ML IN SOLN: 3.0000 mL | Freq: Four times a day (QID) | RESPIRATORY_TRACT | Status: DC | PRN

## 2020-10-02 MED ORDER — SODIUM CHLORIDE 0.9 % IV SOLN
INTRAVENOUS | Status: AC
  Filled 2020-10-02: qty 2

## 2020-10-02 SURGICAL SUPPLY — 53 items
CLOTH BEACON ORANGE TIMEOUT ST (SAFETY) ×3 IMPLANT
COVER LIGHT HANDLE STERIS (MISCELLANEOUS) ×6 IMPLANT
COVER WAND RF STERILE (DRAPES) ×3 IMPLANT
DRSG OPSITE POSTOP 4X10 (GAUZE/BANDAGES/DRESSINGS) ×6 IMPLANT
DRSG TEGADERM 4X4.75 (GAUZE/BANDAGES/DRESSINGS) ×3 IMPLANT
DRSG TELFA 3X8 NADH (GAUZE/BANDAGES/DRESSINGS) ×3 IMPLANT
ELECT REM PT RETURN 9FT ADLT (ELECTROSURGICAL) ×3
ELECTRODE REM PT RTRN 9FT ADLT (ELECTROSURGICAL) ×1 IMPLANT
GAUZE SPONGE 4X4 12PLY STRL (GAUZE/BANDAGES/DRESSINGS) ×3 IMPLANT
GLOVE BIO SURGEON STRL SZ 6.5 (GLOVE) ×4 IMPLANT
GLOVE BIO SURGEON STRL SZ8 (GLOVE) ×3 IMPLANT
GLOVE BIO SURGEONS STRL SZ 6.5 (GLOVE) ×2
GLOVE BIOGEL M 6.5 STRL (GLOVE) ×3 IMPLANT
GLOVE BIOGEL PI IND STRL 6.5 (GLOVE) ×3 IMPLANT
GLOVE BIOGEL PI IND STRL 7.0 (GLOVE) ×4 IMPLANT
GLOVE BIOGEL PI IND STRL 7.5 (GLOVE) ×3 IMPLANT
GLOVE BIOGEL PI INDICATOR 6.5 (GLOVE) ×6
GLOVE BIOGEL PI INDICATOR 7.0 (GLOVE) ×8
GLOVE BIOGEL PI INDICATOR 7.5 (GLOVE) ×6
GLOVE SURG SS PI 7.5 STRL IVOR (GLOVE) ×6 IMPLANT
GOWN STRL REUS W/TWL LRG LVL3 (GOWN DISPOSABLE) ×18 IMPLANT
INST SET MAJOR GENERAL (KITS) ×3 IMPLANT
KIT TURNOVER KIT A (KITS) ×3 IMPLANT
LIGASURE IMPACT 36 18CM CVD LR (INSTRUMENTS) ×3 IMPLANT
MANIFOLD NEPTUNE II (INSTRUMENTS) ×3 IMPLANT
NEEDLE HYPO 18GX1.5 BLUNT FILL (NEEDLE) ×3 IMPLANT
NS IRRIG 1000ML POUR BTL (IV SOLUTION) ×9 IMPLANT
PACK COLON (CUSTOM PROCEDURE TRAY) ×3 IMPLANT
PAD ARMBOARD 7.5X6 YLW CONV (MISCELLANEOUS) ×3 IMPLANT
PENCIL SMOKE EVACUATOR COATED (MISCELLANEOUS) ×3 IMPLANT
RELOAD LINEAR CUT PROX 55 BLUE (ENDOMECHANICALS) IMPLANT
RELOAD PROXIMATE 75MM BLUE (ENDOMECHANICALS) ×6 IMPLANT
RETRACTOR WND ALEXIS-O 25 LRG (MISCELLANEOUS) ×2 IMPLANT
RTRCTR WOUND ALEXIS O 25CM LRG (MISCELLANEOUS) ×6
SHEET LAVH (DRAPES) ×3 IMPLANT
SPONGE LAP 18X18 RF (DISPOSABLE) ×6 IMPLANT
STAPLER AUT SUT 4.8 EEAXL 31 (STAPLE) IMPLANT
STAPLER CIRC CVD 29MM 37CM (STAPLE) IMPLANT
STAPLER GUN LINEAR PROX 60 (STAPLE) ×3 IMPLANT
STAPLER PROXIMATE 55 BLUE (STAPLE) IMPLANT
STAPLER PROXIMATE 75MM BLUE (STAPLE) ×3 IMPLANT
STAPLER VISISTAT (STAPLE) ×3 IMPLANT
SUT PDS AB CT VIOLET #0 27IN (SUTURE) ×6 IMPLANT
SUT PROLENE 2 0 SH 30 (SUTURE) IMPLANT
SUT SILK 0 FSL (SUTURE) ×3 IMPLANT
SUT SILK 3 0 SH CR/8 (SUTURE) ×3 IMPLANT
SUT VIC AB 0 CT1 27 (SUTURE) ×6
SUT VIC AB 0 CT1 27XCR 8 STRN (SUTURE) ×2 IMPLANT
SYR BULB IRRIG 60ML STRL (SYRINGE) IMPLANT
TOWEL SURG RFD BLUE STRL DISP (DISPOSABLE) ×3 IMPLANT
TRAY FOLEY W/BAG SLVR 16FR (SET/KITS/TRAYS/PACK) ×3
TRAY FOLEY W/BAG SLVR 16FR ST (SET/KITS/TRAYS/PACK) ×1 IMPLANT
YANKAUER SUCT BULB TIP 10FT TU (MISCELLANEOUS) ×3 IMPLANT

## 2020-10-02 NOTE — Anesthesia Preprocedure Evaluation (Signed)
Anesthesia Evaluation  Patient identified by MRN, date of birth, ID band Patient awake    Reviewed: Allergy & Precautions, NPO status , Patient's Chart, lab work & pertinent test results  History of Anesthesia Complications (+) history of anesthetic complications (cardiac arrest during hysterectomy)  Airway Mallampati: II  TM Distance: >3 FB Neck ROM: Full    Dental  (+) Edentulous Upper, Edentulous Lower   Pulmonary pneumonia, resolved, COPD,    Pulmonary exam normal breath sounds clear to auscultation       Cardiovascular Exercise Tolerance: Poor hypertension, Pt. on medications  Rhythm:Regular Rate:Bradycardia  28-May-2020 02:53:09 [facility] ROUTINE RECORD Sinus bradycardia Rate slower Confirmed by Ezequiel Essex [phone]) on 05/28/2020 3:03:09 AM   Neuro/Psych PSYCHIATRIC DISORDERS Anxiety Depression Schizophrenia  Neuromuscular disease    GI/Hepatic hiatal hernia, PUD,   Endo/Other  Hypothyroidism   Renal/GU Renal InsufficiencyRenal disease (creatinine 1.4)     Musculoskeletal  (+) Arthritis ,   Abdominal   Peds  Hematology  (+) HIV,   Anesthesia Other Findings   Reproductive/Obstetrics                            Anesthesia Physical Anesthesia Plan  ASA: III  Anesthesia Plan: General   Post-op Pain Management:    Induction: Intravenous  PONV Risk Score and Plan: Ondansetron and Dexamethasone  Airway Management Planned: Oral ETT  Additional Equipment:   Intra-op Plan:   Post-operative Plan: Extubation in OR  Informed Consent: I have reviewed the patients History and Physical, chart, labs and discussed the procedure including the risks, benefits and alternatives for the proposed anesthesia with the patient or authorized representative who has indicated his/her understanding and acceptance.       Plan Discussed with: CRNA and Surgeon  Anesthesia  Plan Comments:         Anesthesia Quick Evaluation

## 2020-10-02 NOTE — Transfer of Care (Signed)
Immediate Anesthesia Transfer of Care Note  Patient: Garnet Koyanagi Monnig  Procedure(s) Performed: COLOSTOMY REVERSAL (N/A )  Patient Location: PACU  Anesthesia Type:General  Level of Consciousness: awake, oriented, drowsy and patient cooperative  Airway & Oxygen Therapy: Patient Spontanous Breathing and Patient connected to nasal cannula oxygen  Post-op Assessment: Report given to RN, Post -op Vital signs reviewed and stable and Patient moving all extremities  Post vital signs: Reviewed and stable  Last Vitals:  Vitals Value Taken Time  BP 166/70 10/02/20 1100  Temp    Pulse    Resp 15 10/02/20 1102  SpO2    Vitals shown include unvalidated device data.  Last Pain:  Vitals:   10/02/20 0821  TempSrc: Oral  PainSc: 0-No pain      Patients Stated Pain Goal: 7 (57/50/51 8335)  Complications: No complications documented.

## 2020-10-02 NOTE — Progress Notes (Signed)
[facility]  Spoke with patient's daughter in law and notified surgery completed.  Daughter in law said she would bring clothes tomorrow and to have patient call [facility] Song when she wakes up.   Tylenol scheduled PRN narcotics Home inhalers Telemetry, home beta blocker ordered  Clear diet  Foley for now Labs in AM SCDs, heparin Home HIV meds ordered   Curlene Labrum, MD [facility] [place], [place] [id number] [phone] (office)

## 2020-10-02 NOTE — Interval H&P Note (Signed)
History and Physical Interval Note:  10/02/2020 8:03 AM  [name]  has presented today for surgery, with the diagnosis of Colostomy in place Stercoral ulcer of large intestine.  The various methods of treatment have been discussed with the patient and family. After consideration of risks, benefits and other options for treatment, the patient has consented to  Procedure(s): COLOSTOMY REVERSAL (N/A) as a surgical intervention.  The patient's history has been reviewed, patient examined, no change in status, stable for surgery.  I have reviewed the patient's chart and labs.  Questions were answered to the patient's satisfaction.    No changes. No questions.  Virl Cagey

## 2020-10-02 NOTE — Anesthesia Postprocedure Evaluation (Signed)
Anesthesia Post Note  Patient: [name]  Procedure(s) Performed: COLOSTOMY REVERSAL (N/A )  Patient location during evaluation: PACU Anesthesia Type: General Level of consciousness: awake, patient cooperative, responds to stimulation and oriented Pain management: pain level controlled Vital Signs Assessment: post-procedure vital signs reviewed and stable Respiratory status: spontaneous breathing, respiratory function stable and nonlabored ventilation Cardiovascular status: blood pressure returned to baseline and stable Postop Assessment: no headache and no backache Anesthetic complications: no   No complications documented.   Last Vitals:  Vitals:   10/02/20 0821  BP: 133/60  Pulse: (!) 47  Resp: 18  Temp: 36.8 C  SpO2: 100%    Last Pain:  Vitals:   10/02/20 0821  TempSrc: Oral  PainSc: 0-No pain                 Tacy Learn

## 2020-10-02 NOTE — Anesthesia Procedure Notes (Signed)
Procedure Name: Intubation Performed by: Tacy Learn, CRNA Pre-anesthesia Checklist: Patient identified, Emergency Drugs available, Suction available, Patient being monitored and Timeout performed Patient Re-evaluated:Patient Re-evaluated prior to induction Oxygen Delivery Method: Circle system utilized Preoxygenation: Pre-oxygenation with 100% oxygen Induction Type: IV induction Laryngoscope Size: 2 Grade View: Grade I Tube size: 6.5 mm Number of attempts: 1 Airway Equipment and Method: Stylet Placement Confirmation: ETT inserted through vocal cords under direct vision,  positive ETCO2,  CO2 detector and breath sounds checked- equal and bilateral Secured at: 21 cm Tube secured with: Tape Dental Injury: Teeth and Oropharynx as per pre-operative assessment

## 2020-10-02 NOTE — Op Note (Signed)
[facility] Operative Note  10/02/20  Preoperative Diagnosis: Colostomy in place, ventral and parastomal hernias    Postoperative Diagnosis: Same   Procedure(s) Performed:  Colostomy reversal with resection of sigmoid colon, primary repair of ventral and parastomal hernias    Surgeon: Lanell Matar. Constance Haw, MD   Assistants: Aviva Signs, MD     Anesthesia: General endotracheal   Anesthesiologist: Denese Killings, MD    Specimens:  Colostomy and Sigmoid colon    Estimated Blood Loss: 50cc   Blood Replacement: None    Complications: None   Wound Class: Clean contaminated    Operative Indications:  [name] is as 79 yo with COPD, CAD, HIV who has a colostomy in place for about 2 years after perforation from what was a stercoral ulcer. She did well with her ostomy and has developed parastomal and ventral hernias and is ready to get this reversed. She has seen cardiology who deemed her acceptable risk and has been on her HIV medication. She had a colonoscopy that revealed no significant pathology.  We discussed the risk of bleeding, infection, anastomotic leak, injury to other organs like the ureter, recurrence of a hernia.   Findings: Redundant sigmoid with diverticula, adhesions of small bowel to left quadrant    Procedure: The patient was taken to the operating room and placed supine. General endotracheal anesthesia was induced. Intravenous antibiotics were administered per protocol.  An orogastric tube positioned to decompress the stomach. A foley catheter was placed and the patient was then placed in lithotomy with all pressure points padded.  The colostomy site was closed with a running 0 silk suture.  The abdomen and perineum were prepared and draped in the usual sterile fashion.   The midline incision was made over her prior scar and carried down to her hernia sac with care. This was entered with scissors, and the remainder of the incision was opened. She  had several fascia defects superiorly that were connected with the incision.  The abdomen was inspected and small bowel was adherent to the left quadrant. This was taken down with sharp scissor dissection. Once the small bowel was freed out of the left quadrant, some minor omental adhesions were taken down around the colostomy and the small bowel that had been in the parastomal hernia was reduced and dissected free from the hernia sac.  A wound protector was placed.  The small bowel was packed into the right upper quadrant.   Attention was then turned to the stump which included the sigmoid colon that had several diverticula but otherwise the colon and rectosigmoid junction were healthy.  Adhesions to the left side pelvic wall were taken down with care to free up the stump further. The peritoneum down into the pelvis was scored with cautery on each side of the stump.    Attention was then turned to freeing up the end colostomy. The wound protector was removed. An elliptical incision was made around the colostomy site that was closed and carried down through to the subcutaneous tissue taking care to not injure the colon or mesentery as the dissection was carried out down to the fascia with cautery. The hernia sac was encountered and excised.  The colostomy was freed and delivered into the abdomen. The end was resected with a linear 75 mm cutting stapler to prevent spillage.  The wound protector was replaced. The splenic flexure had previously been taken down and the transverse colon was redundant. Given the diverticula on the sigmoid colon  a segment of sigmoid colon was taken below the most prominent diverticula with a 75 mm linear cutting stapler.  The two ends were brought side by side in an antimesenteric fashion, and the area was toweled off. Colotomies were made and a 75 mm linear cutting stapler was used to create the common channel.  The common colotomy was closed with a TA 60 stapler. Zero silk suture was  placed in the crotch and over the TA staple line in the lembert fashion.  Omental and epiploic fat were tacked over the staple line. The anastomosis was in proper alignment and not under any tension. Hemostasis was confirmed. Irrigation was performed.   The parastomal hernia was inspected and using interrupted 0 Vicryl the posterior fascia was closed.  The anterior fascia was then inspected and closed in the same fashion with 0 Vicryl interrupted suture.   The entire team then changed gown and gloves and new equipment and over drapes were used per the protocol. The midline was closed with a running 0 PDS suture in the standard fashion, bringing together fascia that had been separated with her ventral hernia.  The skin was closed with staples in the midline and the ostomy site. A honeycomb dressing was placed on the midline and a tefla and tegaderm were placed over the colostomy site.   Dr. Arnoldo Morale was assisting throughout the procedure and was present for the critical portions of the case.   Final inspection revealed acceptable hemostasis. All counts were correct at the end of the case. The patient was awakened from anesthesia and extubated without complication.  The patient went to the PACU in stable condition.   Tanya Labrum, MD [facility] [address] [place], [place] [address] [phone] (office)

## 2020-10-03 ENCOUNTER — Encounter (HOSPITAL_COMMUNITY): Payer: Self-pay | Admitting: General Surgery

## 2020-10-03 LAB — CBC WITH DIFFERENTIAL/PLATELET
Abs Immature Granulocytes: 0.04 K/uL (ref 0.00–0.07)
Basophils Absolute: 0 K/uL (ref 0.0–0.1)
Basophils Relative: 0 %
Eosinophils Absolute: 0 K/uL (ref 0.0–0.5)
Eosinophils Relative: 0 %
HCT: 41 % (ref 36.0–46.0)
Hemoglobin: 14.2 g/dL (ref 12.0–15.0)
Immature Granulocytes: 0 %
Lymphocytes Relative: 18 %
Lymphs Abs: 1.8 K/uL (ref 0.7–4.0)
MCH: 36.4 pg — ABNORMAL HIGH (ref 26.0–34.0)
MCHC: 34.6 g/dL (ref 30.0–36.0)
MCV: 105.1 fL — ABNORMAL HIGH (ref 80.0–100.0)
Monocytes Absolute: 1.4 K/uL — ABNORMAL HIGH (ref 0.1–1.0)
Monocytes Relative: 13 %
Neutro Abs: 7.1 K/uL (ref 1.7–7.7)
Neutrophils Relative %: 69 %
Platelets: 204 K/uL (ref 150–400)
RBC: 3.9 MIL/uL (ref 3.87–5.11)
RDW: 13 % (ref 11.5–15.5)
WBC: 10.3 K/uL (ref 4.0–10.5)
nRBC: 0 % (ref 0.0–0.2)

## 2020-10-03 LAB — BASIC METABOLIC PANEL WITH GFR
Anion gap: 10 (ref 5–15)
BUN: 27 mg/dL — ABNORMAL HIGH (ref 8–23)
CO2: 27 mmol/L (ref 22–32)
Calcium: 9.8 mg/dL (ref 8.9–10.3)
Chloride: 100 mmol/L (ref 98–111)
Creatinine, Ser: 1.66 mg/dL — ABNORMAL HIGH (ref 0.44–1.00)
GFR, Estimated: 31 mL/min — ABNORMAL LOW
Glucose, Bld: 125 mg/dL — ABNORMAL HIGH (ref 70–99)
Potassium: 4.5 mmol/L (ref 3.5–5.1)
Sodium: 137 mmol/L (ref 135–145)

## 2020-10-03 LAB — MAGNESIUM: Magnesium: 1.8 mg/dL (ref 1.7–2.4)

## 2020-10-03 LAB — SURGICAL PATHOLOGY

## 2020-10-03 LAB — PHOSPHORUS: Phosphorus: 3.1 mg/dL (ref 2.5–4.6)

## 2020-10-03 NOTE — Progress Notes (Signed)
500cc output post catheter removal. Pt states no discomfort when urinating.

## 2020-10-03 NOTE — Progress Notes (Signed)
Initial Nutrition Assessment  DOCUMENTATION CODES:   Obesity unspecified  INTERVENTION:  Continue Ensure surgery BID, provides 330 kcal and 18 grams protein  Education provided   Unsure of accuracy of admission weight, will order new weight   NUTRITION DIAGNOSIS:   Inadequate oral intake related to decreased appetite as evidenced by per patient/family report.    GOAL:   Patient will meet greater than or equal to 90% of their needs    MONITOR:   PO intake,Supplement acceptance,Labs,Weight trends,I & O's,Diet advancement,Skin  REASON FOR ASSESSMENT:   Malnutrition Screening Tool    ASSESSMENT:   79 year old female with history of end colostomy (1/20) for pneumoperitoneum related to stercoral ulcer, hernia, COPD, HIV, CKD stage III, CAD, left sided weakness s/p TIA, HTN, peripheral neuropathy presents for colostomy reversal.  Patient adjusting position in bed, reports unable to find a comfortable position and having abdominal soreness. Pt endorses tolerating full liquid lunch, recalls drinking all of her chicken broth and is consuming Ensure surgery supplement BID. Patient says her appetite is "bad" at home, says she hasn't been eating very much lately and drinks 2-3  Boost original (240 kcal, 10 grams protein) daily. She endorses weight loss, states "I've gotten so flabby" as she raises her arm and pulls at loose skin around tricep region. Patient recalls usual wt ~200-205 lbs with wt loss beginning in June. Per chart, on 12/30/19, pt weighed 86.1 kg (189.42 lbs) on 02/27/20 she weighed 82.6 kg (189.42 lbs), on 05/28/20 weights decreased to 77.1 kg (169.62 lbs), on 8/12, 9/20, and 10/05 pt weighed 77.6 kg (170.72 lbs), on 11/09 she weighed 75.3 kg (165.66 lbs), on 11/18 she weighed 74.8 kg (164.56 lbs) and currently pt weighs 81.9 kg  (180.18 lbs) Unsure of accuracy of current weight. Will order daily weights. RD discussed trying to eat small frequent meals/snacks, eating soft foods  that are easily digested, limiting high fat, greasy foods, educated on types of supplements and encouraged changing to more nutrient dense supplement at home.   I/Os: +2621.8 ml since admit UOP: 1825 ml x 24 hrs  NUTRITION - FOCUSED PHYSICAL EXAM:  Flowsheet Row Most Recent Value  Orbital Region Mild depletion  Upper Arm Region No depletion  Thoracic and Lumbar Region No depletion  Buccal Region Mild depletion  Temple Region Mild depletion  Clavicle Bone Region No depletion  Clavicle and Acromion Bone Region No depletion  Scapular Bone Region No depletion  Dorsal Hand Mild depletion  Patellar Region Mild depletion  Anterior Thigh Region No depletion  Posterior Calf Region Unable to assess  [wearing bilateral SCD]  Edema (RD Assessment) None  Hair Reviewed  Eyes Reviewed  Mouth Reviewed  Skin Reviewed  Nails Reviewed       Diet Order:   Diet Order            Diet full liquid Room service appropriate? Yes; Fluid consistency: Thin  Diet effective now                 EDUCATION NEEDS:   Education needs have been addressed  Skin:  Skin Assessment: Skin Integrity Issues: Skin Integrity Issues:: Incisions Incisions: closed;abdomen  Last BM:  12/7  Height:   Ht Readings from Last 1 Encounters:  10/02/20 5\' 2"  (1.575 m)    Weight:   Wt Readings from Last 1 Encounters:  10/03/20 81.9 kg    BMI:  Body mass index is 33.02 kg/m.  Estimated Nutritional Needs:   Kcal:  1900-2100  Protein:  90-105  Fluid:  >/= 1.9 L/day    Lajuan Lines, RD, LDN Clinical Nutrition After Hours/Weekend Pager # in [place]

## 2020-10-03 NOTE — Progress Notes (Signed)
[facility] Progress Note  1 Day Post-Op  Subjective: Doing well. Had some minor nausea but tolerated breakfast. Sore she reports.   Objective: Vital signs in last 24 hours: Temp:  [98.2 F (36.8 C)-99.5 F (37.5 C)] 99.4 F (37.4 C) (12/09 0554) Pulse Rate:  [53-64] 63 (12/09 0554) Resp:  [16-20] 20 (12/09 0554) BP: (144-211)/(54-78) 162/57 (12/09 0554) SpO2:  [97 %-98 %] 98 % (12/09 0554) Weight:  [73 kg-81.9 kg] 81.9 kg (12/09 0500) Last BM Date: 10/01/20  Intake/Output from previous day: 12/08 0701 - 12/09 0700 In: 4546.8 [P.O.:480; I.V.:3866.2; IV Piggyback:200.5] Out: 1925 [Urine:1825; Blood:50] Intake/Output this shift: No intake/output data recorded.  General appearance: alert, cooperative and no distress Resp: normal work of breathing GI: soft, nondistended, appropriately tender, honeycomb c/d/i with no drainage, colostomy site gauze with some drainage  Lab Results:  Recent Labs    10/03/20 0446  WBC 10.3  HGB 14.2  HCT 41.0  PLT 204   BMET Recent Labs    10/03/20 0446  NA 137  K 4.5  CL 100  CO2 27  GLUCOSE 125*  BUN 27*  CREATININE 1.66*  CALCIUM 9.8    Anti-infectives: Anti-infectives (From admission, onward)   Start     Dose/Rate Route Frequency Ordered Stop   10/03/20 1000  abacavir-dolutegravir-lamiVUDine (TRIUMEQ) 600-50-300 MG per tablet 1 tablet        1 tablet Oral Daily 10/02/20 1641     10/02/20 2100  cefoTEtan (CEFOTAN) 2 g in sodium chloride 0.9 % 100 mL IVPB        2 g 200 mL/hr over 30 Minutes Intravenous Every 12 hours 10/02/20 1641 10/04/20 0959   10/02/20 0700  cefoTEtan (CEFOTAN) 2 g in sodium chloride 0.9 % 100 mL IVPB        2 g 200 mL/hr over 30 Minutes Intravenous On call to O.R. 10/02/20 0659 10/02/20 1641      Assessment/Plan: [name] is a 79 yo s/p colostomy reversal and sigmoid colon resection. Doing well. Tolerated clears. Tylenol scheduled, PRN narcotics IS, OOB Ambulate Cardiac  monitor, beta blocker ordered, had some post op A fib last surgery in 2020, will monitor for now Full liquids, entereg until having BMs Labs in AM UOP good, foley out, Cr at baseline  SCDs, heparin sq  Will see if needs PT if has issues with ambulating   LOS: 1 day    Virl Cagey 10/03/2020

## 2020-10-03 NOTE — Progress Notes (Signed)
Pt able to tolerate standing for a total of 2 minutes. Pt refused to take steps due to being afraid of abdomen hurting.

## 2020-10-04 LAB — BASIC METABOLIC PANEL
Anion gap: 9 (ref 5–15)
BUN: 17 mg/dL (ref 8–23)
CO2: 25 mmol/L (ref 22–32)
Calcium: 9.4 mg/dL (ref 8.9–10.3)
Chloride: 105 mmol/L (ref 98–111)
Creatinine, Ser: 1.45 mg/dL — ABNORMAL HIGH (ref 0.44–1.00)
GFR, Estimated: 37 mL/min — ABNORMAL LOW (ref >60)
Glucose, Bld: 117 mg/dL — ABNORMAL HIGH (ref 70–99)
Potassium: 4 mmol/L (ref 3.5–5.1)
Sodium: 139 mmol/L (ref 135–145)

## 2020-10-04 LAB — CBC WITH DIFFERENTIAL/PLATELET
Abs Immature Granulocytes: 0.04 10*3/uL (ref 0.00–0.07)
Basophils Absolute: 0 10*3/uL (ref 0.0–0.1)
Basophils Relative: 0 %
Eosinophils Absolute: 0.2 10*3/uL (ref 0.0–0.5)
Eosinophils Relative: 2 %
HCT: 38.5 % (ref 36.0–46.0)
Hemoglobin: 13 g/dL (ref 12.0–15.0)
Immature Granulocytes: 1 %
Lymphocytes Relative: 22 %
Lymphs Abs: 1.8 10*3/uL (ref 0.7–4.0)
MCH: 35.6 pg — ABNORMAL HIGH (ref 26.0–34.0)
MCHC: 33.8 g/dL (ref 30.0–36.0)
MCV: 105.5 fL — ABNORMAL HIGH (ref 80.0–100.0)
Monocytes Absolute: 0.8 10*3/uL (ref 0.1–1.0)
Monocytes Relative: 10 %
Neutro Abs: 5.4 10*3/uL (ref 1.7–7.7)
Neutrophils Relative %: 65 %
Platelets: 151 10*3/uL (ref 150–400)
RBC: 3.65 MIL/uL — ABNORMAL LOW (ref 3.87–5.11)
RDW: 13 % (ref 11.5–15.5)
WBC: 8.2 10*3/uL (ref 4.0–10.5)
nRBC: 0 % (ref 0.0–0.2)

## 2020-10-04 LAB — TYPE AND SCREEN
ABO/RH(D): A NEG
Antibody Screen: POSITIVE
Unit division: 0
Unit division: 0

## 2020-10-04 LAB — BPAM RBC
Blood Product Expiration Date: 202201092359
Blood Product Expiration Date: 202201092359
Unit Type and Rh: 600
Unit Type and Rh: 600

## 2020-10-04 NOTE — Plan of Care (Signed)
  Problem: Acute Rehab PT Goals(only PT should resolve) Goal: Pt Will Go Supine/Side To Sit Outcome: Progressing Flowsheets (Taken 10/04/2020 1213) Pt will go Supine/Side to Sit: with minimal assist Goal: Patient Will Transfer Sit To/From Stand Outcome: Progressing Flowsheets (Taken 10/04/2020 1213) Patient will transfer sit to/from stand: with minimal assist Goal: Pt Will Transfer Bed To Chair/Chair To Bed Outcome: Progressing Flowsheets (Taken 10/04/2020 1213) Pt will Transfer Bed to Chair/Chair to Bed:  with min assist  with mod assist Goal: Pt Will Ambulate Outcome: Progressing Flowsheets (Taken 10/04/2020 1213) Pt will Ambulate:  25 feet  with minimal assist  with least restrictive assistive device Goal: Pt/caregiver will Perform Home Exercise Program Outcome: Progressing Flowsheets (Taken 10/04/2020 1213) Pt/caregiver will Perform Home Exercise Program:  For increased strengthening  For improved balance  Independently   12:14 PM, 10/04/20 Mearl Latin PT, DPT Physical Therapist at [facility]

## 2020-10-04 NOTE — Evaluation (Signed)
Physical Therapy Evaluation Patient Details Name: [name] MRN: [id number] DOB: 04/14/1941 Today's Date: 10/04/2020   History of Present Illness  [name] is well known to me after a end colostomy 10/2018 for pneumoperitoneum related to a stercoral ulcer. She has done well with her colostomy and has a history of COPD, HIV, CKD, CAD and overall is doing well. she is depressed about the bag and wants it reversed. She has had her colonoscopy and this demonstrated diverticulosis throughout and cecal polyps but nothing concerning. She has been seen by cardiology and no additional workup was recommended. She is feeling good and wants to proceed with reversal as soon as possible.     Clinical Impression  Patient limited for functional mobility as stated below secondary to BLE weakness, fatigue and poor standing balance. Patient requires min/mod assist to pull to seated for transition to sitting EOB. Patient initially requires assist for sitting balance secondary to pain but after several minutes is able to sit independently showing good sitting tolerance. Patient requires mod/max assist to transfer to standing with RW secondary to abdominal pain and weakness. Patient limited to several slow, labored, and unsteady steps at bedside with RW to ambulate to chair requiring assist for weakness. Patient tolerates sitting up in chair at bedside at end of session.  Patient will benefit from continued physical therapy in hospital and recommended venue below to increase strength, balance, endurance for safe ADLs and gait.     Follow Up Recommendations SNF    Equipment Recommendations  None recommended by PT    Recommendations for Other Services       Precautions / Restrictions Precautions Precautions: Fall Precaution Comments: s/p colostomy reversal Required Braces or Orthoses:  (abdominal binder) Restrictions Weight Bearing Restrictions: No      Mobility  Bed Mobility Overal bed mobility:  Needs Assistance Bed Mobility: Supine to Sit     Supine to sit: Min assist;Mod assist;HOB elevated     General bed mobility comments: to transition to seated EOB, slow, labored    Transfers Overall transfer level: Needs assistance Equipment used: Rolling walker (2 wheeled) Transfers: Sit to/from [facility] Sit to Stand: Mod assist;Max assist Stand pivot transfers: Mod assist;Max assist       General transfer comment: requires assist secondary to pain and LE weakness  Ambulation/Gait Ambulation/Gait assistance: Mod assist Gait Distance (Feet): 3 Feet Assistive device: Rolling walker (2 wheeled) Gait Pattern/deviations: Shuffle;Trunk flexed Gait velocity: decreased   General Gait Details: slow, labored, unsteady cadence to chair with RW  Stairs            Wheelchair Mobility    Modified Rankin (Stroke Patients Only)       Balance Overall balance assessment: Needs assistance Sitting-balance support: Bilateral upper extremity supported Sitting balance-Leahy Scale: Fair Sitting balance - Comments: requires support initially for balance, sits without support after several minutes   Standing balance support: Bilateral upper extremity supported Standing balance-Leahy Scale: Poor Standing balance comment: with RW                             Pertinent Vitals/Pain Pain Assessment: 0-10 Pain Score: 8  Pain Location: stomach Pain Descriptors / Indicators: Sore;Sharp Pain Intervention(s): Limited activity within patient's tolerance;Monitored during session;Repositioned    Home Living Family/patient expects to be discharged to:: Private residence Living Arrangements: Other (Comment) (roommate) Available Help at Discharge: Available 24 hours/day Type of Home: Apartment Home Access: Stairs to  enter Entrance Stairs-Rails: Right;Left;Can reach both Entrance Stairs-Number of Steps: 4 Home Layout: One level Home Equipment: Walker - 2  wheels;Cane - single point;Bedside commode;Shower seat      Prior Function Level of Independence: Independent with assistive device(s)         Comments: Patient states independent with basic ADL most of the time, household ambulator with SPC or RW     Hand Dominance        Extremity/Trunk Assessment   Upper Extremity Assessment Upper Extremity Assessment: Generalized weakness    Lower Extremity Assessment Lower Extremity Assessment: Generalized weakness    Cervical / Trunk Assessment Cervical / Trunk Assessment: Normal  Communication   Communication: No difficulties  Cognition Arousal/Alertness: Awake/alert Behavior During Therapy: WFL for tasks assessed/performed Overall Cognitive Status: Within Functional Limits for tasks assessed                                        General Comments      Exercises     Assessment/Plan    PT Assessment Patient needs continued PT services  PT Problem List Decreased strength;Decreased mobility;Decreased activity tolerance;Decreased balance;Pain       PT Treatment Interventions DME instruction;Therapeutic exercise;Gait training;Balance training;Stair training;Neuromuscular re-education;Functional mobility training;Therapeutic activities;Patient/family education    PT Goals (Current goals can be found in the Care Plan section)  Acute Rehab PT Goals Patient Stated Goal: Return home PT Goal Formulation: With patient Time For Goal Achievement: 10/18/20 Potential to Achieve Goals: Good    Frequency Min 3X/week   Barriers to discharge        Co-evaluation               AM-PAC PT "6 Clicks" Mobility  Outcome Measure Help needed turning from your back to your side while in a flat bed without using bedrails?: A Little Help needed moving from lying on your back to sitting on the side of a flat bed without using bedrails?: A Lot Help needed moving to and from a bed to a chair (including a wheelchair)?:  A Lot Help needed standing up from a chair using your arms (e.g., wheelchair or bedside chair)?: A Lot Help needed to walk in hospital room?: A Lot Help needed climbing 3-5 steps with a railing? : Total 6 Click Score: 12    End of Session Equipment Utilized During Treatment: Gait belt Activity Tolerance: Patient tolerated treatment well;Patient limited by fatigue;Patient limited by pain Patient left: in chair;with call bell/phone within reach Nurse Communication: Mobility status PT Visit Diagnosis: Unsteadiness on feet (R26.81);Other abnormalities of gait and mobility (R26.89);Muscle weakness (generalized) (M62.81)    Time: 1245-8099 PT Time Calculation (min) (ACUTE ONLY): 30 min   Charges:   PT Evaluation $PT Eval Moderate Complexity: 1 Mod PT Treatments $Therapeutic Activity: 23-37 mins        12:11 PM, 10/04/20 Mearl Latin PT, DPT Physical Therapist at [facility]

## 2020-10-04 NOTE — Progress Notes (Signed)
[facility] Progress Note  2 Days Post-Op  Subjective: Some nausea. Wanting to take roxicodone for pain. Is on pain contract and takes 10mg  BID normally. Has not had roxi more than twice a day yet.  No BM but has had some gas. On full liquid diet.   Objective: Vital signs in last 24 hours: Temp:  [97.6 F (36.4 C)-98.8 F (37.1 C)] 98.8 F (37.1 C) (12/10 0557) Pulse Rate:  [69-74] 72 (12/10 0557) Resp:  [18-19] 18 (12/10 0557) BP: (159-183)/(57-81) 167/79 (12/10 0557) SpO2:  [91 %-97 %] 91 % (12/10 0557) Weight:  [77.4 kg-78.6 kg] 77.4 kg (12/10 0500) Last BM Date: 10/01/20  Intake/Output from previous day: 12/09 0701 - 12/10 0700 In: 1700 [P.O.:600; I.V.:1000; IV Piggyback:100] Out: 2050 [Urine:2050] Intake/Output this shift: Total I/O In: -  Out: 250 [Urine:250]  General appearance: alert, cooperative and no distress Resp: normal work of breathing GI: soft, nondistended, ostomy site c/d/i with staples, honeycomb in place with midline c/d/i with staples   Lab Results:  Recent Labs    10/03/20 0446 10/04/20 0506  WBC 10.3 8.2  HGB 14.2 13.0  HCT 41.0 38.5  PLT 204 151   BMET Recent Labs    10/03/20 0446 10/04/20 0506  NA 137 139  K 4.5 4.0  CL 100 105  CO2 27 25  GLUCOSE 125* 117*  BUN 27* 17  CREATININE 1.66* 1.45*  CALCIUM 9.8 9.4   PT/INR No results for input(s): LABPROT, INR in the last 72 hours.  Studies/Results: No results found.  Anti-infectives: Anti-infectives (From admission, onward)   Start     Dose/Rate Route Frequency Ordered Stop   10/03/20 1000  abacavir-dolutegravir-lamiVUDine (TRIUMEQ) 600-50-300 MG per tablet 1 tablet        1 tablet Oral Daily 10/02/20 1641     10/02/20 2100  cefoTEtan (CEFOTAN) 2 g in sodium chloride 0.9 % 100 mL IVPB        2 g 200 mL/hr over 30 Minutes Intravenous Every 12 hours 10/02/20 1641 10/03/20 2230   10/02/20 0700  cefoTEtan (CEFOTAN) 2 g in sodium chloride 0.9 % 100 mL IVPB         2 g 200 mL/hr over 30 Minutes Intravenous On call to O.R. 10/02/20 0659 10/02/20 1641      Assessment/Plan: Ms. [name] is a 79 yo s/p colostomy reversal and sigmoid colon resection. Doing well.  Tylenol scheduled, PRN narcotics, on home roxicodone bid for back pain IS, OOB Ambulate Cardiac monitor, beta blocker ordered, had some post op A fib last surgery in 2020, will monitor for now  Full liquids, entereg until having BMs Labs looking good, will repeat CBC with H&H dropping some, no signs of active bleeding  UOP good, Cr baseline  SCDs, heparin sq  PT ordered for mobility  Dr. Arnoldo Morale seeing over the weekend.   LOS: 1 day   LOS: 2 days    Virl Cagey 10/04/2020

## 2020-10-04 NOTE — Care Management Important Message (Signed)
Important Message  Patient Details  Name: [name] MRN: [id number] Date of Birth: 1941-03-08   Medicare Important Message Given:  Yes     [name] 10/04/2020, 4:01 PM

## 2020-10-05 LAB — CBC WITH DIFFERENTIAL/PLATELET
Abs Immature Granulocytes: 0.04 10*3/uL (ref 0.00–0.07)
Basophils Absolute: 0.1 10*3/uL (ref 0.0–0.1)
Basophils Relative: 1 %
Eosinophils Absolute: 0.3 10*3/uL (ref 0.0–0.5)
Eosinophils Relative: 3 %
HCT: 37.4 % (ref 36.0–46.0)
Hemoglobin: 12.4 g/dL (ref 12.0–15.0)
Immature Granulocytes: 1 %
Lymphocytes Relative: 21 %
Lymphs Abs: 1.7 10*3/uL (ref 0.7–4.0)
MCH: 35.5 pg — ABNORMAL HIGH (ref 26.0–34.0)
MCHC: 33.2 g/dL (ref 30.0–36.0)
MCV: 107.2 fL — ABNORMAL HIGH (ref 80.0–100.0)
Monocytes Absolute: 0.8 10*3/uL (ref 0.1–1.0)
Monocytes Relative: 10 %
Neutro Abs: 5.5 10*3/uL (ref 1.7–7.7)
Neutrophils Relative %: 64 %
Platelets: 165 10*3/uL (ref 150–400)
RBC: 3.49 MIL/uL — ABNORMAL LOW (ref 3.87–5.11)
RDW: 13.1 % (ref 11.5–15.5)
WBC: 8.4 10*3/uL (ref 4.0–10.5)
nRBC: 0 % (ref 0.0–0.2)

## 2020-10-05 LAB — BASIC METABOLIC PANEL
Anion gap: 8 (ref 5–15)
BUN: 13 mg/dL (ref 8–23)
CO2: 26 mmol/L (ref 22–32)
Calcium: 9.3 mg/dL (ref 8.9–10.3)
Chloride: 105 mmol/L (ref 98–111)
Creatinine, Ser: 1.17 mg/dL — ABNORMAL HIGH (ref 0.44–1.00)
GFR, Estimated: 47 mL/min — ABNORMAL LOW (ref >60)
Glucose, Bld: 106 mg/dL — ABNORMAL HIGH (ref 70–99)
Potassium: 4 mmol/L (ref 3.5–5.1)
Sodium: 139 mmol/L (ref 135–145)

## 2020-10-05 NOTE — Progress Notes (Signed)
3 Days Post-Op  Subjective: Patient still with mild incisional pain.  She has had 3 bowel movements since yesterday.  She is tolerating her full liquid diet well.  Objective: Vital signs in last 24 hours: Temp:  [98.4 F (36.9 C)-98.9 F (37.2 C)] 98.4 F (36.9 C) (12/11 0436) Pulse Rate:  [66-67] 66 (12/11 0436) Resp:  [18] 18 (12/11 0436) BP: (153-176)/(66-80) 176/80 (12/11 0436) SpO2:  [93 %-98 %] 98 % (12/11 0436) Last BM Date: 10/01/20  Intake/Output from previous day: 12/10 0701 - 12/11 0700 In: 500.8 [P.O.:500; I.V.:0.8] Out: 1550 [Urine:1550] Intake/Output this shift: No intake/output data recorded.  General appearance: alert, cooperative and no distress Resp: clear to auscultation bilaterally Cardio: irregularly irregular rhythm GI: Soft, incisions healing well.  Bowel sounds active.  Lab Results:  Recent Labs    10/04/20 0506 10/05/20 0443  WBC 8.2 8.4  HGB 13.0 12.4  HCT 38.5 37.4  PLT 151 165   BMET Recent Labs    10/04/20 0506 10/05/20 0443  NA 139 139  K 4.0 4.0  CL 105 105  CO2 25 26  GLUCOSE 117* 106*  BUN 17 13  CREATININE 1.45* 1.17*  CALCIUM 9.4 9.3   PT/INR No results for input(s): LABPROT, INR in the last 72 hours.  Studies/Results: No results found.  Anti-infectives: Anti-infectives (From admission, onward)   Start     Dose/Rate Route Frequency Ordered Stop   10/03/20 1000  abacavir-dolutegravir-lamiVUDine (TRIUMEQ) 600-50-300 MG per tablet 1 tablet        1 tablet Oral Daily 10/02/20 1641     10/02/20 2100  cefoTEtan (CEFOTAN) 2 g in sodium chloride 0.9 % 100 mL IVPB        2 g 200 mL/hr over 30 Minutes Intravenous Every 12 hours 10/02/20 1641 10/03/20 2230   10/02/20 0700  cefoTEtan (CEFOTAN) 2 g in sodium chloride 0.9 % 100 mL IVPB        2 g 200 mL/hr over 30 Minutes Intravenous On call to O.R. 10/02/20 0659 10/02/20 1641      Assessment/Plan: s/p Procedure(s): COLOSTOMY REVERSAL Impression: Stable on postoperative  day 3.  Bowel function has returned.  Will advance to soft diet.  Will stop Entereg.  Hematocrit stable.  Patient is deconditioned and will encourage sitting in the chair and ambulation.  LOS: 3 days    Aviva Signs 10/05/2020

## 2020-10-05 NOTE — TOC Initial Note (Addendum)
Transition of Care [facility]) - Initial/Assessment Note   Patient Details  Name: [name] MRN: [id number] Date of Birth: [birth year]  Transition of Care [facility]) CM/SW Contact:    Sherie Don, LCSW Phone Number: 10/05/2020, 10:50 AM  Clinical Narrative: Patient is a 79 year old female who was admitted for colostomy in place. PT evaluation recommended SNF. CSW met with patient to complete assessment and discuss SNF. Per patient, she resides in an apartment with a roommate that assists with ADLs as needed. Patient's current DME includes a cane, walker, shower chair, and BSC. Patient reported she was providing her own transportation pre-admission. CSW asked about SNF, but patient declined as she would prefer to return home. Patient is agreeable to [facility] services or OP PT if patient is not accepted by a [facility] agency. TOC to follow.  Addendum: CSW made [facility] referral to [facility], which was declined. CSW made referral to [place] with [facility] that was accepted.  Expected Discharge Plan: [place] Barriers to Discharge: Continued Medical Work up  Patient Goals and CMS Choice Patient states their goals for this hospitalization and ongoing recovery are:: Go home CMS Medicare.gov Compare Post Acute Care list provided to:: Patient Choice offered to / list presented to : Patient  Expected Discharge Plan and Services Expected Discharge Plan: [place] In-house Referral: Clinical Social Work Discharge Planning Services: NA Post Acute Care Choice: Home Health Living arrangements for the past 2 months: Apartment              DME Arranged: N/A DME Agency: NA  Prior Living Arrangements/Services Living arrangements for the past 2 months: Apartment Lives with:: Roommate Patient language and need for interpreter reviewed:: Yes Do you feel safe going back to the place where you live?: Yes      Need for Family Participation in Patient Care: No (Comment) Care giver support system  in place?: Yes (comment) Current home services: DME (Cane, walker, shower chair, BSC) Criminal Activity/Legal Involvement Pertinent to Current Situation/Hospitalization: No - Comment as needed  Activities of Daily Living Home Assistive Devices/Equipment: None ADL Screening (condition at time of admission) Patient's cognitive ability adequate to safely complete daily activities?: Yes Is the patient deaf or have difficulty hearing?: No Does the patient have difficulty seeing, even when wearing glasses/contacts?: Yes Does the patient have difficulty concentrating, remembering, or making decisions?: No Patient able to express need for assistance with ADLs?: Yes Does the patient have difficulty dressing or bathing?: Yes Independently performs ADLs?: Yes (appropriate for developmental age) Does the patient have difficulty walking or climbing stairs?: No Weakness of Legs: None Weakness of Arms/Hands: None  Permission Sought/Granted Permission sought to share information with : Other (comment) Permission granted to share info w AGENCY: [place] agencies  Emotional Assessment Appearance:: Appears stated age Attitude/Demeanor/Rapport: Engaged Affect (typically observed): Accepting Orientation: : Oriented to Self,Oriented to Place,Oriented to  Time,Oriented to Situation Alcohol / Substance Use: Not Applicable Psych Involvement: No (comment)  Admission diagnosis:  Colostomy in place [facility]) [Z93.3] Patient Active Problem List   Diagnosis Date Noted  . Mass of left side of neck 09/12/2020  . Colostomy in place [facility]) 09/12/2020  . Housing problems 06/06/2020  . Healthcare maintenance 06/06/2020  . Abdominal wall bulge 04/22/2020  . Acute renal failure superimposed on stage 3b chronic kidney disease ([place])   . History of colonic polyps 07/06/2019  . History of partial colectomy 07/06/2019  . Seasonal allergies 03/27/2019  . Dysuria 03/27/2019  .  Obesity (BMI 30-39.9) 11/12/2018  . Constipation  due to opioid therapy 11/12/2018  . Lactic acidosis 11/12/2018  . Large bowel perforation ([place]) 11/12/2018  . Peritonitis ([place]) 11/12/2018  . Stercoral ulcer of large intestine 11/12/2018  . Pneumoperitoneum   . COPD (chronic obstructive pulmonary disease) ([place]) 12/17/2017  . Hypothyroidism 12/17/2017  . Steroid-induced psychosis, with hallucinations ([place])   . UTI (urinary tract infection) 03/03/2017  . Nausea 03/03/2017  . Weight loss 03/03/2017  . CAP (community acquired pneumonia) 07/22/2016  . CKD (chronic kidney disease) stage 3, GFR 30-59 ml/min (HCC) 05/04/2016  . Elevated LFTs 05/04/2016  . 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 Breath shortness 06/19/2014  . Syncope 05/31/2014  . Rib fracture 05/31/2014  . Weight gain 03/21/2014  . Cervical spondylosis 08/15/2013  . Neck pain on left side 05/24/2013  . Unspecified vitamin D deficiency 12/14/2012  . Vitamin D toxicity 12/14/2012  . Hemorrhoid 09/01/2012  . Myalgia 02/25/2012  . HTN (hypertension) 11/25/2011  . Cramps, muscle, general 08/18/2011  . RECTAL BLEEDING 11/03/2010  . DYSPHAGIA 11/03/2010  . FATIGUE 08/29/2010  . MUSCLE PAIN 06/04/2010  . Cystitis 05/12/2010  . RENAL INSUFFICIENCY 12/18/2008  . DEPRESSION 09/04/2008  . IRRITABLE BOWEL SYNDROME 05/22/2008  . HERNIATED CERVICAL DISC 10/05/2007  . HERNIATED LUMBAR DISC 10/05/2007  . History of urinary tract infection 09/20/2007  . HIV-1 associated autonomic neuropathy ([place]) 03/16/2007  . ELBOW PAIN 03/16/2007  . ISCHEMIC COLITIS 12/27/2006  . RENAL FAILURE NOS 12/27/2006  . HIV infection ([place]) 08/27/2006   PCP:  Wannetta Sender, FNP Pharmacy:   [place], [place] [address] [place] [address] Phone: [phone] Fax: [place], [place] [address] Phone: [phone] Fax: [phone]  Readmission Risk  Interventions No flowsheet data found.

## 2020-10-06 NOTE — Plan of Care (Signed)

## 2020-10-06 NOTE — Progress Notes (Signed)
4 Days Post-Op  Subjective: Patient having multiple bowel movements.  Still feels weak.  No significant incisional pain noted.  Objective: Vital signs in last 24 hours: Temp:  [98 F (36.7 C)-98.8 F (37.1 C)] 98.8 F (37.1 C) (12/12 0423) Pulse Rate:  [64-67] 67 (12/12 0423) Resp:  [16-18] 18 (12/12 0423) BP: (144-197)/(65-78) 197/78 (12/12 0423) SpO2:  [93 %-96 %] 94 % (12/12 0423) Last BM Date: 10/05/20  Intake/Output from previous day: 12/11 0701 - 12/12 0700 In: 360 [P.O.:360] Out: -  Intake/Output this shift: No intake/output data recorded.  General appearance: alert, cooperative and no distress Resp: clear to auscultation bilaterally Cardio: regular rate and rhythm, S1, S2 normal, no murmur, click, rub or gallop GI: Soft, incisions healing well.  No erythema or drainage noted.  Bowel sounds active.  Lab Results:  Recent Labs    10/04/20 0506 10/05/20 0443  WBC 8.2 8.4  HGB 13.0 12.4  HCT 38.5 37.4  PLT 151 165   BMET Recent Labs    10/04/20 0506 10/05/20 0443  NA 139 139  K 4.0 4.0  CL 105 105  CO2 25 26  GLUCOSE 117* 106*  BUN 17 13  CREATININE 1.45* 1.17*  CALCIUM 9.4 9.3   PT/INR No results for input(s): LABPROT, INR in the last 72 hours.  Studies/Results: No results found.  Anti-infectives: Anti-infectives (From admission, onward)   Start     Dose/Rate Route Frequency Ordered Stop   10/03/20 1000  abacavir-dolutegravir-lamiVUDine (TRIUMEQ) 600-50-300 MG per tablet 1 tablet        1 tablet Oral Daily 10/02/20 1641     10/02/20 2100  cefoTEtan (CEFOTAN) 2 g in sodium chloride 0.9 % 100 mL IVPB        2 g 200 mL/hr over 30 Minutes Intravenous Every 12 hours 10/02/20 1641 10/03/20 2230   10/02/20 0700  cefoTEtan (CEFOTAN) 2 g in sodium chloride 0.9 % 100 mL IVPB        2 g 200 mL/hr over 30 Minutes Intravenous On call to O.R. 10/02/20 0659 10/02/20 1641      Assessment/Plan: s/p Procedure(s): COLOSTOMY REVERSAL Impression: Stable on  postoperative day 4.  Bowel function has returned.  Patient still somewhat deconditioned and weak.  We will continue ambulation.  Anticipate discharge in next 24 to 48 hours.  We will stop cardiac telemetry.  LOS: 4 days    Aviva Signs 10/06/2020

## 2020-10-07 LAB — SARS CORONAVIRUS 2 BY RT PCR (HOSPITAL ORDER, PERFORMED IN ~~LOC~~ HOSPITAL LAB): SARS Coronavirus 2: NEGATIVE

## 2020-10-07 NOTE — TOC Progression Note (Signed)
Transition of Care [facility]) - Progression Note   Patient Details  Name: [name] MRN: [id number] Date of Birth: January 26, 1941  Transition of Care [facility]) CM/SW [place], LCSW Phone Number: 10/07/2020, 12:44 PM  Clinical Narrative: Patient now agreeable to SNF and is requesting [facility]. CSW spoke with patient and patient reported she is not vaccinated for COVID. Per Marianna Fuss at [facility], they would not accept the patient for SNF even if she received her first dose in the hospital. Patient agreeable to being referred out to other SNFs. FL2 completed and PASRR received. Patient faxed out for SNF. CSW started insurance authorization with Bernadene Bell; reference ID is: [id number]. Clinicals faxed to [facility]. TOC awaiting bed offers.  Expected Discharge Plan: [place] Barriers to Discharge: Continued Medical Work up  Expected Discharge Plan and Services Expected Discharge Plan: [place] In-house Referral: Clinical Social Work Discharge Planning Services: NA Post Acute Care Choice: Home Health Living arrangements for the past 2 months: Apartment             DME Arranged: N/A DME Agency: NA  Readmission Risk Interventions No flowsheet data found.

## 2020-10-07 NOTE — NC FL2 (Signed)
[place] LEVEL OF CARE SCREENING TOOL     IDENTIFICATION  Patient Name: [name] Birthdate: Oct 05, 1941 Sex: adult Admission Date (Current Location): 10/02/2020  [facility] and [place] Number:  [employer] and Address:  [place] [address], [place]      Provider Number: [id number]  Attending Physician Name and Address:  Virl Cagey, MD  Relative Name and Phone Number:  Heloise Ochoa (son) Ph: [phone]    Current Level of Care: Hospital Recommended Level of Care: [place] Prior Approval Number:    Date Approved/Denied:   PASRR Number: [id number]  Discharge Plan: SNF    Current Diagnoses: Patient Active Problem List   Diagnosis Date Noted  . Mass of left side of neck 09/12/2020  . Colostomy in place [facility]) 09/12/2020  . Housing problems 06/06/2020  . Healthcare maintenance 06/06/2020  . Abdominal wall bulge 04/22/2020  . Acute renal failure superimposed on stage 3b chronic kidney disease ([place])   . History of colonic polyps 07/06/2019  . History of partial colectomy 07/06/2019  . Seasonal allergies 03/27/2019  . Dysuria 03/27/2019  . Obesity (BMI 30-39.9) 11/12/2018  . Constipation due to opioid therapy 11/12/2018  . Lactic acidosis 11/12/2018  . Large bowel perforation ([place]) 11/12/2018  . Peritonitis ([place]) 11/12/2018  . Stercoral ulcer of large intestine 11/12/2018  . Pneumoperitoneum   . COPD (chronic obstructive pulmonary disease) ([place]) 12/17/2017  . Hypothyroidism 12/17/2017  . Steroid-induced psychosis, with hallucinations ([place])   . UTI (urinary tract infection) 03/03/2017  . Nausea 03/03/2017  . Weight loss 03/03/2017  . CAP (community acquired pneumonia) 07/22/2016  . CKD (chronic kidney disease) stage 3, GFR 30-59 ml/min (HCC) 05/04/2016  . Elevated LFTs 05/04/2016  . 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 Breath shortness 06/19/2014  . Syncope 05/31/2014  . Rib  fracture 05/31/2014  . Weight gain 03/21/2014  . Cervical spondylosis 08/15/2013  . Neck pain on left side 05/24/2013  . Unspecified vitamin D deficiency 12/14/2012  . Vitamin D toxicity 12/14/2012  . Hemorrhoid 09/01/2012  . Myalgia 02/25/2012  . HTN (hypertension) 11/25/2011  . Cramps, muscle, general 08/18/2011  . RECTAL BLEEDING 11/03/2010  . DYSPHAGIA 11/03/2010  . FATIGUE 08/29/2010  . MUSCLE PAIN 06/04/2010  . Cystitis 05/12/2010  . RENAL INSUFFICIENCY 12/18/2008  . DEPRESSION 09/04/2008  . IRRITABLE BOWEL SYNDROME 05/22/2008  . HERNIATED CERVICAL DISC 10/05/2007  . HERNIATED LUMBAR DISC 10/05/2007  . History of urinary tract infection 09/20/2007  . HIV-1 associated autonomic neuropathy ([place]) 03/16/2007  . ELBOW PAIN 03/16/2007  . ISCHEMIC COLITIS 12/27/2006  . RENAL FAILURE NOS 12/27/2006  . HIV infection ([place]) 08/27/2006    Orientation RESPIRATION BLADDER Height & Weight     Self,Time,Situation,Place  Normal Incontinent Weight: 170 lb 10.2 oz (77.4 kg) Height:  5\' 2"  (157.5 cm)  BEHAVIORAL SYMPTOMS/MOOD NEUROLOGICAL BOWEL NUTRITION STATUS      Incontinent Diet (Soft diet)  AMBULATORY STATUS COMMUNICATION OF NEEDS Skin   Extensive Assist Verbally Surgical wounds,Other (Comment) (Surgical incision from colostomy; ecchymosis: left knee and arm)                       Personal Care Assistance Level of Assistance  Bathing,Feeding,Dressing Bathing Assistance: Maximum assistance Feeding assistance: Independent Dressing Assistance: Limited assistance     Functional Limitations Info  Sight,Hearing,Speech Sight Info: Adequate Hearing Info: Adequate Speech Info: Adequate    SPECIAL CARE FACTORS FREQUENCY  PT (By licensed  PT)     PT Frequency: 5x/s/week              Contractures Contractures Info: Not present    Additional Factors Info  Code Status,Allergies,Psychotropic Code Status Info: Full Allergies Info: Bee Venom; Promethazine; Tenofovir  Disoproxil Fumarate (Tenofovir Disoproxil Fumarate); Compazine (Prochlorperazine Edisylate); Haloperidol Lactate; Sulfa Antibiotics; Brompheniramine-acetaminophen; Chlorcyclizine; Chlorpromazine; Codeine   Navane (Thiothixene); Prednisone; Promethazine Hcl; Propoxyphene N-acetaminophen; Morphine Psychotropic Info: Ativan         Current Medications (10/07/2020):  This is the current hospital active medication list Current Facility-Administered Medications  Medication Dose Route Frequency Provider Last Rate Last Admin  . abacavir-dolutegravir-lamiVUDine (TRIUMEQ) 546-27-035 MG per tablet 1 tablet  1 tablet Oral Daily Virl Cagey, MD   1 tablet at 10/07/20 1108  . acetaminophen (TYLENOL) tablet 1,000 mg  1,000 mg Oral Q6H Virl Cagey, MD   1,000 mg at 10/07/20 0538  . albuterol (PROVENTIL) (2.5 MG/3ML) 0.083% nebulizer solution 3 mL  3 mL Nebulization Q6H PRN Virl Cagey, MD      . alum & mag hydroxide-simeth (MAALOX/MYLANTA) 200-200-20 MG/5ML suspension 30 mL  30 mL Oral Q6H PRN Virl Cagey, MD      . Chlorhexidine Gluconate Cloth 2 % PADS 6 each  6 each Topical Daily Virl Cagey, MD   6 each at 10/07/20 1109  . diphenhydrAMINE (BENADRYL) 12.5 MG/5ML elixir 12.5 mg  12.5 mg Oral Q6H PRN Virl Cagey, MD       Or  . diphenhydrAMINE (BENADRYL) injection 12.5 mg  12.5 mg Intravenous Q6H PRN Virl Cagey, MD      . docusate sodium (COLACE) capsule 100 mg  100 mg Oral BID Virl Cagey, MD   100 mg at 10/05/20 0845  . feeding supplement (ENSURE SURGERY) liquid 237 mL  237 mL Oral BID BM Virl Cagey, MD   237 mL at 10/06/20 0953  . fluticasone (FLONASE) 50 MCG/ACT nasal spray 2 spray  2 spray Each Nare QHS Virl Cagey, MD   2 spray at 10/02/20 2130  . heparin injection 5,000 Units  5,000 Units Subcutaneous Q8H Virl Cagey, MD   5,000 Units at 10/07/20 [phone]  . HYDROmorphone (DILAUDID) injection 0.5 mg  0.5 mg Intravenous Q3H PRN  Virl Cagey, MD      . ipratropium-albuterol (DUONEB) 0.5-2.5 (3) MG/3ML nebulizer solution 3 mL  3 mL Inhalation Q6H PRN Virl Cagey, MD      . LORazepam (ATIVAN) tablet 0.5 mg  0.5 mg Oral BID Virl Cagey, MD   0.5 mg at 10/07/20 1107  . metoCLOPramide (REGLAN) injection 10 mg  10 mg Intravenous Q6H PRN Virl Cagey, MD      . metoprolol tartrate (LOPRESSOR) injection 5 mg  5 mg Intravenous Q6H PRN Virl Cagey, MD      . nebivolol (BYSTOLIC) tablet 5 mg  5 mg Oral Daily Virl Cagey, MD   5 mg at 10/07/20 1108  . ondansetron (ZOFRAN) tablet 4 mg  4 mg Oral Q6H PRN Virl Cagey, MD       Or  . ondansetron [facility]) injection 4 mg  4 mg Intravenous Q6H PRN Virl Cagey, MD   4 mg at 10/06/20 1942  . oxyCODONE (Oxy IR/ROXICODONE) immediate release tablet 5-10 mg  5-10 mg Oral Q4H PRN Virl Cagey, MD   10 mg at 10/05/20 1157  . pantoprazole (PROTONIX) EC tablet 40 mg  40 mg Oral QAC breakfast Virl Cagey, MD   40 mg at 10/07/20 1107  . polyvinyl alcohol (LIQUIFILM TEARS) 1.4 % ophthalmic solution 1 drop  1 drop Both Eyes Daily Virl Cagey, MD   1 drop at 10/02/20 1743  . simethicone (MYLICON) chewable tablet 40 mg  40 mg Oral Q6H PRN Virl Cagey, MD      . thiamine tablet 100 mg  100 mg Oral Daily Virl Cagey, MD   100 mg at 10/07/20 1107  . zolpidem (AMBIEN) tablet 5 mg  5 mg Oral QHS PRN Virl Cagey, MD         Discharge Medications: Please see discharge summary for a list of discharge medications.  Relevant Imaging Results:  Relevant Lab Results:   Additional Information SSN: [id number]  Sherie Don, LCSW

## 2020-10-07 NOTE — TOC Progression Note (Signed)
Transition of Care [facility]) - Progression Note    Patient Details  Name: [name] MRN: [id number] Date of Birth: [birth year]  Transition of Care [facility]) CM/SW [place], [place] Phone Number: 10/07/2020, 6:18 PM  Clinical Narrative:    CSW visited pt in room to review bed offers from [place] and [place]. Pt has chosen bed at [facility] for SNF. CSW made call to [facility] to update on pts bed decision. Josem Kaufmann has been approved, start date 10/08/2020 and next review date will be 10/10/2020. The care coordinator is Lanier Ensign. Clinicals for continued stay can be faxed to [phone]. The reference ID# is [id number]. TOC to follow.    Expected Discharge Plan: [place] Barriers to Discharge: Continued Medical Work up  Expected Discharge Plan and Services Expected Discharge Plan: [place] In-house Referral: Clinical Social Work Discharge Planning Services: NA Post Acute Care Choice: Home Health Living arrangements for the past 2 months: Apartment                 DME Arranged: N/A DME Agency: NA                   Social Determinants of Health (SDOH) Interventions    Readmission Risk Interventions No flowsheet data found.

## 2020-10-07 NOTE — Progress Notes (Signed)
[facility] Progress Note  5 Days Post-Op  Subjective: Having sore abdomen inferior. Taking 2 people to get out of chair. Refused SNF but now is reconsidering. Understands she will get more rehab/ PT with SNF placement. Only has boyfriend at home to help her.  Having flatus and BM. Tolerating diet with some minor nausea.   Objective: Vital signs in last 24 hours: Temp:  [98.4 F (36.9 C)-98.6 F (37 C)] 98.4 F (36.9 C) (12/12 2152) Pulse Rate:  [58-62] 59 (12/12 2152) Resp:  [20] 20 (12/12 2152) BP: (146-181)/(75-80) 180/75 (12/12 2152) SpO2:  [96 %-98 %] 96 % (12/12 2152) Last BM Date: 10/06/20  Intake/Output from previous day: 12/12 0701 - 12/13 0700 In: 503.3 [P.O.:240; I.V.:263.3] Out: -  Intake/Output this shift: No intake/output data recorded.  General appearance: alert, cooperative and no distress Resp: normal work of breathing GI: soft, nondistended, staples c/d/i with no erythema or drainage, binder replaced Extremities: extremities normal, atraumatic, no cyanosis or edema  Lab Results:  Recent Labs    10/05/20 0443  WBC 8.4  HGB 12.4  HCT 37.4  PLT 165   BMET Recent Labs    10/05/20 0443  NA 139  K 4.0  CL 105  CO2 26  GLUCOSE 106*  BUN 13  CREATININE 1.17*  CALCIUM 9.3   PT/INR No results for input(s): LABPROT, INR in the last 72 hours.  Studies/Results: No results found.  Anti-infectives: Anti-infectives (From admission, onward)   Start     Dose/Rate Route Frequency Ordered Stop   10/03/20 1000  abacavir-dolutegravir-lamiVUDine (TRIUMEQ) 600-50-300 MG per tablet 1 tablet        1 tablet Oral Daily 10/02/20 1641     10/02/20 2100  cefoTEtan (CEFOTAN) 2 g in sodium chloride 0.9 % 100 mL IVPB        2 g 200 mL/hr over 30 Minutes Intravenous Every 12 hours 10/02/20 1641 10/03/20 2230   10/02/20 0700  cefoTEtan (CEFOTAN) 2 g in sodium chloride 0.9 % 100 mL IVPB        2 g 200 mL/hr over 30 Minutes Intravenous On call to  O.R. 10/02/20 0659 10/02/20 1641      Assessment/Plan: Ms. Carrell is a 43 s/p Colostomy reversal. Doing well overall and having bowel function and eating a diet. But is very weak. She now agrees to a SNF for PT /rehab.  Tylenol and PRN narcotics for pain but not taking much IS, OOB Working with [place] [facility] Will try to get placed  Called and updated Nellie, and left Daryl and Pam and message about rehab placement  SCDs, heparin sq Ok to shower with assistance    LOS: 5 days    Virl Cagey 10/07/2020

## 2020-10-08 MED ORDER — ACETAMINOPHEN 500 MG PO TABS: 1000.0000 mg | ORAL_TABLET | Freq: Four times a day (QID) | ORAL | 0 refills | Status: DC | PRN

## 2020-10-08 MED ORDER — PSYLLIUM 95 % PO PACK
1.0000 | PACK | Freq: Every day | ORAL | Status: DC
  Administered 2020-10-08: 09:00:00 1 via ORAL
  Filled 2020-10-08: qty 1

## 2020-10-08 MED ORDER — PSYLLIUM 95 % PO PACK
1.0000 | PACK | Freq: Every day | ORAL | 2 refills | Status: DC
Start: 2020-10-09 — End: 2021-06-26

## 2020-10-08 MED ORDER — OXYCODONE HCL 10 MG PO TABS: 10.0000 mg | ORAL_TABLET | Freq: Two times a day (BID) | ORAL | 0 refills | Status: AC | PRN

## 2020-10-08 NOTE — TOC Transition Note (Signed)
Transition of Care [facility]) - CM/SW Discharge Note  Patient Details  Name: [name] MRN: [id number] Date of Birth: November 06, 1940  Transition of Care [facility]) CM/SW Contact:  Sherie Don, LCSW Phone Number: 10/08/2020, 10:53 AM  Clinical Narrative: Patient has insurance authorization for SNF. CSW spoke with Jackelyn Poling at [facility] and quarantine bed is available. Patient will go to room B18 bed 1. COVID test is negative. Discharge summary, discharge orders, FL2, and PT notes faxed to [facility] EMS scheduled; medical necessity form completed and provided to RN station. Patient notified of discharge. Voicemail left for son. TOC signing off.  Final next level of care: Skilled Nursing Facility Barriers to Discharge: Barriers Resolved  Patient Goals and CMS Choice Patient states their goals for this hospitalization and ongoing recovery are:: Go to rehab CMS Medicare.gov Compare Post Acute Care list provided to:: Patient Choice offered to / list presented to : Patient  Discharge Placement PASRR number recieved: 10/07/20         Patient chooses bed at: [facility] Patient to be transferred to facility by: [facility] Name of family member notified: Heloise Ochoa (son) Patient and family notified of of transfer: 10/08/20  Discharge Plan and Services In-house Referral: Clinical Social Work Discharge Planning Services: NA Post Acute Care Choice: Home Health          DME Arranged: N/A DME Agency: NA HH Arranged: PT [place] Agency: [place] Date [facility] Agency Contacted: 10/08/20 Representative spoke with at [place]: Georgina Snell  Readmission Risk Interventions No flowsheet data found.

## 2020-10-08 NOTE — Care Management Important Message (Signed)
Important Message  Patient Details  Name: [name] MRN: [id number] Date of Birth: 09/02/1941   Medicare Important Message Given:  Yes     [name] 10/08/2020, 11:42 AM

## 2020-10-08 NOTE — Discharge Summary (Addendum)
Physician Discharge Summary  Patient ID: [name] MRN: [id number] DOB/AGE: 1941/02/06 79 y.o.  Admit date: 10/02/2020 Discharge date: 10/08/2020  Admission Diagnoses: Colostomy in place   Discharge Diagnoses:  Principal Problem:   Colostomy in place [facility])   Discharged Condition: fair  Hospital Course: [name] is a very sweet 79 yo who had a colostomy in place for the past 2 years from a stercoral ulcer. She did well and had a colonoscopy in preparation for reversal. She was risk stratified by cardiology. She was taken back for colostomy reversal and had some additional sigmoid colon resected. Post operatively she did well but was weak and need assistance ambulating and getting in and out of bed to the chair. Given this PT recommended SNF for further rehab. She was tolerating a diet, having regular BMs, and having adequate pain control on her home twice daily roxicodone and tylenol for pain.    The SNF could not give her the chronic narcotic until tomorrow evening unless I prescribed some, so I wrote for 2 days of her Roxicodone 10mg  BID.   Consults: Physical Therapy  Significant Diagnostic Studies:  COVID test- 10/07/2020 negative  Results for [name], [name] (MRN [id number]) as of 10/08/2020 10:59  Ref. Range 10/05/2020 04:43  Sodium Latest Ref Range: 135 - 145 mmol/L 139  Potassium Latest Ref Range: 3.5 - 5.1 mmol/L 4.0  Chloride Latest Ref Range: 98 - 111 mmol/L 105  CO2 Latest Ref Range: 22 - 32 mmol/L 26  Glucose Latest Ref Range: 70 - 99 mg/dL 106 (H)  BUN Latest Ref Range: 8 - 23 mg/dL 13  Creatinine Latest Ref Range: 0.44 - 1.00 mg/dL 1.17 (H)  Calcium Latest Ref Range: 8.9 - 10.3 mg/dL 9.3  Anion gap Latest Ref Range: 5 - 15  8  GFR, Estimated Latest Ref Range: >60 mL/min 47 (L)  WBC Latest Ref Range: 4.0 - 10.5 K/uL 8.4  RBC Latest Ref Range: 3.87 - 5.11 MIL/uL 3.49 (L)  Hemoglobin Latest Ref Range: 12.0 - 15.0 g/dL 12.4  HCT Latest Ref Range: 36.0 - 46.0  % 37.4  MCV Latest Ref Range: 80.0 - 100.0 fL 107.2 (H)  MCH Latest Ref Range: 26.0 - 34.0 pg 35.5 (H)  MCHC Latest Ref Range: 30.0 - 36.0 g/dL 33.2  RDW Latest Ref Range: 11.5 - 15.5 % 13.1  Platelets Latest Ref Range: 150 - 400 K/uL 165  nRBC Latest Ref Range: 0.0 - 0.2 % 0.0  Neutrophils Latest Units: % 64  Lymphocytes Latest Units: % 21  Monocytes Relative Latest Units: % 10  Eosinophil Latest Units: % 3  Basophil Latest Units: % 1  Immature Granulocytes Latest Units: % 1  NEUT# Latest Ref Range: 1.7 - 7.7 K/uL 5.5  Lymphocyte # Latest Ref Range: 0.7 - 4.0 K/uL 1.7  Monocyte # Latest Ref Range: 0.1 - 1.0 K/uL 0.8  Eosinophils Absolute Latest Ref Range: 0.0 - 0.5 K/uL 0.3  Basophils Absolute Latest Ref Range: 0.0 - 0.1 K/uL 0.1  Abs Immature Granulocytes Latest Ref Range: 0.00 - 0.07 K/uL 0.04   Treatments: 10/02/20- Colostomy reversal   Pathology: FINAL MICROSCOPIC DIAGNOSIS:   A. OSTOMY, COLOSTOMY REVERSAL:  - Ostomy site.   B. COLON, SIGMOID, RESECTION:  - Diverticulosis.   Discharge Exam: Blood pressure 133/86, pulse 65, temperature 98.8 F (37.1 C), temperature source Oral, resp. rate 18, height 5\' 2"  (1.575 m), weight 77.4 kg, SpO2 96 %. General appearance: alert, cooperative and no distress Resp: normal work  of breathing GI: soft, nondistended, appropriately tender, staples c/d/i without erythema or drainage Extremities: extremities normal, atraumatic, no cyanosis or edema  Disposition: Discharge disposition: 03-Skilled Nursing Facility       Discharge Instructions    Discharge instructions   Complete by: As directed    Discharge Open Abdominal Surgery Instructions:  Common Complaints: Pain at the incision site is common. This will improve with time. Take your pain medications as described below. Some nausea is common and poor appetite. The main goal is to stay hydrated the first few days after surgery.   Diet/ Activity: Diet as tolerated. You have  started and tolerated a diet in the hospital, and should continue to increase what you are able to eat.   You may not have a large appetite, but it is important to stay hydrated. Drink 64 ounces of water a day. Your appetite will return with time.  Keep a dry dressing in place over your staples daily or as needed. Some minor pink/ blood tinged drainage is expected. This will stop in a few days after surgery.  Shower per your regular routine daily.  Do not take hot showers. Take warm showers that are less than 10 minutes. Pat the incision dry. Wear an abdominal binder daily with activity. You do not have to wear this while sleeping or sitting.  Rest and listen to your body, but do not remain in bed all day.  Walk everyday for at least 15-20 minutes. Deep cough and move around every 1-2 hours in the first few days after surgery.  Do not lift > 10 lbs, perform excessive bending, pushing, pulling, squatting for 6-8 weeks after surgery.  The activity restrictions and the abdominal binder are to prevent hernia formation at your incision while you are healing.  Do not place lotions or balms on your incision unless instructed to specifically by [name].   Pain Expectations and Narcotics: -After surgery you will have pain associated with your incisions and this is normal. The pain is muscular and nerve pain, and will get better with time. -You are encouraged and expected to take non narcotic medications like tylenol and ibuprofen (when able) to treat pain as multiple modalities can aid with pain treatment. -Narcotics are only used when pain is severe or there is breakthrough pain. -You are not expected to have a pain score of 0 after surgery, as we cannot prevent pain. A pain score of 3-4 that allows you to be functional, move, walk, and tolerate some activity is the goal. The pain will continue to improve over the days after surgery and is dependent on your surgery. -Due to [place] law, we are only able to  give a certain amount of pain medication to treat post operative pain, and we only give additional narcotics on a patient by patient basis.  -You have a pain contract so we will not be prescribing more pain medication. -Having appropriate expectations of pain and knowledge of pain management with non narcotics is important as we do not want anyone to become addicted to narcotic pain medication.  -Using ice packs in the first 48 hours and heating pads after 48 hours, wearing an abdominal binder (when recommended), and using over the counter medications are all ways to help with pain management.   -Simple acts like meditation and mindfulness practices after surgery can also help with pain control and research has proven the benefit of these practices.  Medication: Take tylenol and ibuprofen as needed for pain control,  alternating every 4-6 hours.  Example:  Tylenol 1000mg  @ 6am, 12noon, 6pm, 91midnight (Do not exceed 4000mg  of tylenol a day). Ibuprofen 800mg  @ 9am, 3pm, 9pm, 3am (Do not exceed 3600mg  of ibuprofen a day).  Take Roxicodone for breakthrough pain as you are prescribed by the pain clinic.  Take Colace for constipation related to narcotic pain medication. If you do not have a bowel movement in 2 days, take Miralax over the counter.  Drink plenty of water to also prevent constipation.   Contact Information: If you have questions or concerns, please call our office, [phone], Monday- Thursday 8AM-5PM and Friday 8AM-12Noon.  If it is after hours or on the weekend, please call [facility] Main Number, [phone], and ask to speak to the surgeon on call for [name] at [facility].   Increase activity slowly   Complete by: As directed      Allergies as of 10/08/2020      Reactions   Bee Venom Anaphylaxis   Promethazine Swelling   Tenofovir Disoproxil Fumarate [tenofovir Disoproxil Fumarate] Other (See Comments)   Renal failure, (08/27/2006)   Compazine  [prochlorperazine Edisylate]  Nausea And Vomiting   Haloperidol Lactate Other (See Comments)   Reaction is muscle tension, causes severe spasms in face and neck. Forces eyes to roll in the back of the head   Sulfa Antibiotics Diarrhea   Brompheniramine-acetaminophen    Chlorcyclizine    Chlorpromazine    Codeine Itching, Nausea And Vomiting   Navane [thiothixene] Other (See Comments), Nausea And Vomiting   Same muscle spasm reaction as haldol   Prednisone    Brief mild psychosis and agitation   Promethazine Hcl Other (See Comments)   Propoxyphene N-acetaminophen Itching, Nausea And Vomiting   Morphine Itching, Swelling      Medication List    TAKE these medications   acetaminophen 500 MG tablet Commonly known as: TYLENOL Take 2 tablets (1,000 mg total) by mouth every 6 (six) hours as needed for mild pain or moderate pain.   albuterol 108 (90 Base) MCG/ACT inhaler Commonly known as: VENTOLIN HFA Inhale 1-2 puffs into the lungs every 6 (six) hours as needed for wheezing or shortness of breath.   Benadryl 25 mg capsule Generic drug: diphenhydrAMINE Take 25 mg by mouth daily as needed for itching (itching from codeine).   docusate sodium 100 MG capsule Commonly known as: COLACE Take 1 capsule (100 mg total) by mouth 2 (two) times daily.   DULoxetine 20 MG capsule Commonly known as: CYMBALTA Take 20 mg by mouth at bedtime.   Fish Oil Burp-Less 1000 MG Caps Take 1 capsule by mouth daily.   fluticasone 50 MCG/ACT nasal spray Commonly known as: FLONASE 2 SPRAYS INTO BOTH NOSTRILS AT BEDTIME. What changed: See the new instructions.   furosemide 40 MG tablet Commonly known as: LASIX Take 1 tablet (40 mg total) by mouth daily as needed for fluid. Hold until follow up with PCP   ipratropium-albuterol 0.5-2.5 (3) MG/3ML Soln Commonly known as: DUONEB Inhale 3 mLs into the lungs every 6 (six) hours as needed (shortness of breath).   LORazepam 0.5 MG tablet Commonly known as: ATIVAN Take 0.5 mg by mouth  2 (two) times daily.   magnesium 30 MG tablet Take 30 mg by mouth at bedtime.   nebivolol 5 MG tablet Commonly known as: Bystolic Take 1 tablet (5 mg total) by mouth daily.   ondansetron 8 MG tablet Commonly known as: ZOFRAN Take 8 mg by mouth daily as  needed for nausea or vomiting.   ONE-A-DAY 55 PLUS PO Take 1 tablet by mouth daily.   Oxycodone HCl 10 MG Tabs Take 1 tablet (10 mg total) by mouth 2 (two) times daily as needed for up to 2 days (for pain).   pantoprazole 40 MG tablet Commonly known as: Protonix Take 1 tablet (40 mg total) by mouth daily before breakfast.   polyvinyl alcohol 1.4 % ophthalmic solution Commonly known as: LIQUIFILM TEARS Place 1 drop into both eyes daily.   psyllium 95 % Pack Commonly known as: HYDROCIL/METAMUCIL Take 1 packet by mouth daily. Start taking on: October 09, 2020   thiamine 100 MG tablet Commonly known as: Vitamin B-1 Take 100 mg by mouth daily.   Triumeq 600-50-300 MG tablet Generic drug: abacavir-dolutegravir-lamiVUDine Take 1 tablet by mouth daily.   Vitamin D3 250 MCG (10000 UT) Tabs Take 1 tablet by mouth daily.       Contact information for follow-up providers    [facility] Follow up.   Why: HHPT       Virl Cagey, MD Follow up on 10/15/2020.   Specialty: General Surgery Why: 10:15 for staple removal  Contact information: [address] Linna Hoff [facility] [address] [phone]            Contact information for after-discharge care    [facility] .   Service: Skilled Nursing Contact information: [address] [place] [phone]                 Future Appointments  Date Time Provider [place]  10/15/2020 10:15 AM Virl Cagey, MD [facility] None  12/17/2020  3:30 PM [name], Lavell Islam, MD [facility]  03/03/2021  2:40 PM Branch, Alphonse Guild, MD [facility]     Signed: Virl Cagey 10/08/2020, 11:50 AM

## 2020-10-08 NOTE — Discharge Instructions (Signed)
Colostomy Reversal Surgery, Care After This sheet gives you information about how to care for yourself after your procedure. Your health care provider may also give you more specific instructions. If you have problems or questions, contact your health care provider. What can I expect after the procedure? After the procedure, it is common to have:  Pain and discomfort in your abdomen, especially near your incision.  Loose stools (diarrhea).  Decreased appetite.  Constipation. Follow these instructions at home: Activity   Do not lift anything that is heavier than 10 lb (4.5 kg), or the limit that you are told, until your health care provider says that it is safe.  Return to your normal activities as told by your health care provider. Ask your health care provider what activities are safe for you.  Avoid sitting for a long time without moving. Get up to take short walks every 1-2 hours. This is important to improve blood flow and breathing. Ask for help if you feel weak or unsteady.  Avoid strenuous activity, contact sports, and abdominal exercises for 4 weeks or as long as told by your health care provider. Incision care   Follow instructions from your health care provider about how to take care of your incision. Make sure you: ? Wash your hands with soap and water before and after you change your bandage (dressing). If soap and water are not available, use hand sanitizer. ? Change your dressing as told by your health care provider. ? Leave stitches (sutures), skin glue, or adhesive strips in place. These skin closures may need to stay in place for 2 weeks or longer. If adhesive strip edges start to loosen and curl up, you may trim the loose edges. Do not remove adhesive strips completely unless your health care provider tells you to do that.  Keep the incision area clean and dry.  Check your incision area every day for signs of infection. Check for: ? More redness, swelling, or  pain. ? More fluid or blood. ? Warmth. ? Pus or a bad smell. Bathing  Do not take baths, swim, or use a hot tub until your health care provider approves.  You may shower.  If your health care provider approves bathing and showering, cover the dressing with a watertight covering to protect it from water. Do not let the dressing get wet.  Keep the dressing dry until your health care provider says it can be removed. Driving  Do not drive for 24 hours if you were given a sedative during your procedure. Follow other driving restrictions as told by your health care provider.  Do not drive or use heavy machinery while taking prescription pain medicine. Eating and drinking  Follow instructions from your health care provider about eating or drinking restrictions. This may include: ? What to eat and drink. You may be told to start eating a bland diet. Over time, you may slowly resume a more normal, healthy diet. ? How much to eat and drink. You should eat small meals often and stop eating when you feel full.  Take nutrition supplements as told by your health care provider or dietitian. General instructions  Take over-the-counter and prescription medicines only as told by your health care provider.  Take steps to treat diarrhea or constipation as told by your health care provider. Your health care provider may recommend that you: ? Drink enough fluid to keep your urine pale yellow. Avoid fluids that contain a lot of sugar or caffeine, such as energy  drinks, sports drinks, and soda. ? Eat bland, easy-to-digest foods in small amounts as you are able. These foods include bananas, applesauce, rice, lean meats, toast, and crackers. ? Take over-the-counter or prescription medicines. ? Limit foods that are high in fat and processed sugars, such as fried or sweet foods.  Do not use any products that contain nicotine or tobacco, such as cigarettes, e-cigarettes, and chewing tobacco. These can delay  incision healing after surgery. If you need help quitting, ask your health care provider.  Keep all follow-up visits as told by your health care provider. This is important. Contact a health care provider if:  You have more redness, swelling, or pain at the site of your incision.  You have more fluid or blood coming from your incision.  Your incision feels warm to the touch.  You have pus or a bad smell coming from your incision.  You have a fever.  Your incision breaks open.  You feel nauseous.  You are not able to have a bowel movement (are constipated).  Your diarrhea gets worse.  You have pain that is not controlled with medicine. Get help right away if you have:  Abdominal pain that does not go away or becomes severe.  Frequent vomiting and you are not able to eat or drink.  Difficulty breathing. Summary  After colostomy reversal surgery, it is common to have abdominal pain, decreased appetite, diarrhea, or constipation.  Follow instructions from your health care provider about how to take care of your incision. Do not let the dressing get wet.  Take over-the-counter and prescription medicines only as told by your health care provider.  Contact your health care provider if you are not able to have a bowel movement (are constipated).  Keep all follow-up visits as told by your health care provider. This is important. This information is not intended to replace advice given to you by your health care provider. Make sure you discuss any questions you have with your health care provider. Document Revised: 03/30/2018 Document Reviewed: 03/30/2018 Elsevier Patient Education  [place].  Discharge Open Abdominal Surgery Instructions:  Common Complaints: Pain at the incision site is common. This will improve with time. Take your pain medications as described below. Some nausea is common and poor appetite. The main goal is to stay hydrated the first few days  after surgery.   Diet/ Activity: Diet as tolerated. You have started and tolerated a diet in the hospital, and should continue to increase what you are able to eat.   You may not have a large appetite, but it is important to stay hydrated. Drink 64 ounces of water a day. Your appetite will return with time.  Keep a dry dressing in place over your staples daily or as needed. Some minor pink/ blood tinged drainage is expected. This will stop in a few days after surgery.  Shower per your regular routine daily.  Do not take hot showers. Take warm showers that are less than 10 minutes. Pat the incision dry. Wear an abdominal binder daily with activity. You do not have to wear this while sleeping or sitting.  Rest and listen to your body, but do not remain in bed all day.  Walk everyday for at least 15-20 minutes. Deep cough and move around every 1-2 hours in the first few days after surgery.  Do not lift > 10 lbs, perform excessive bending, pushing, pulling, squatting for 6-8 weeks after surgery.  The activity restrictions and the  abdominal binder are to prevent hernia formation at your incision while you are healing.  Do not place lotions or balms on your incision unless instructed to specifically by Dr. Constance Haw.   Pain Expectations and Narcotics: -After surgery you will have pain associated with your incisions and this is normal. The pain is muscular and nerve pain, and will get better with time. -You are encouraged and expected to take non narcotic medications like tylenol and ibuprofen (when able) to treat pain as multiple modalities can aid with pain treatment. -Narcotics are only used when pain is severe or there is breakthrough pain. -You are not expected to have a pain score of 0 after surgery, as we cannot prevent pain. A pain score of 3-4 that allows you to be functional, move, walk, and tolerate some activity is the goal. The pain will continue to improve over the days after surgery and is  dependent on your surgery. -Due to [place] law, we are only able to give a certain amount of pain medication to treat post operative pain, and we only give additional narcotics on a patient by patient basis.  -You have a pain contract so we will not be prescribing more pain medication. -Having appropriate expectations of pain and knowledge of pain management with non narcotics is important as we do not want anyone to become addicted to narcotic pain medication.  -Using ice packs in the first 48 hours and heating pads after 48 hours, wearing an abdominal binder (when recommended), and using over the counter medications are all ways to help with pain management.   -Simple acts like meditation and mindfulness practices after surgery can also help with pain control and research has proven the benefit of these practices.  Medication: Take tylenol and ibuprofen as needed for pain control, alternating every 4-6 hours.  Example:  Tylenol 1000mg  @ 6am, 12noon, 6pm, 80midnight (Do not exceed 4000mg  of tylenol a day). Ibuprofen 800mg  @ 9am, 3pm, 9pm, 3am (Do not exceed 3600mg  of ibuprofen a day).  Take Roxicodone for breakthrough pain as you are prescribed by the pain clinic.  Take Colace for constipation related to narcotic pain medication. If you do not have a bowel movement in 2 days, take Miralax over the counter.  Drink plenty of water to also prevent constipation.   Contact Information: If you have questions or concerns, please call our office, [phone], Monday- Thursday 8AM-5PM and Friday 8AM-12Noon.  If it is after hours or on the weekend, please call [facility] Main Number, [phone], and ask to speak to the surgeon on call for Dr. Constance Haw at [facility].

## 2020-10-08 NOTE — Progress Notes (Signed)
Report given to Shanon Brow at [place] center. No further questions at this time.

## 2020-10-15 ENCOUNTER — Ambulatory Visit: Payer: Medicare Other | Admitting: General Surgery

## 2020-10-15 ENCOUNTER — Other Ambulatory Visit: Payer: Self-pay

## 2020-10-15 ENCOUNTER — Encounter: Payer: Self-pay | Admitting: General Surgery

## 2020-10-15 ENCOUNTER — Ambulatory Visit (INDEPENDENT_AMBULATORY_CARE_PROVIDER_SITE_OTHER): Payer: Medicare Other | Admitting: General Surgery

## 2020-10-15 VITALS — BP 155/83 | HR 62 | Temp 98.0°F | Resp 14 | Ht 62.0 in | Wt 163.0 lb

## 2020-10-15 DIAGNOSIS — Z9889 Other specified postprocedural states: Secondary | ICD-10-CM

## 2020-10-15 DIAGNOSIS — B372 Candidiasis of skin and nail: Secondary | ICD-10-CM

## 2020-10-15 MED ORDER — NYSTATIN 100000 UNIT/GM EX POWD: 1.0000 "application " | Freq: Three times a day (TID) | CUTANEOUS | 1 refills | Status: DC

## 2020-10-15 NOTE — Progress Notes (Signed)
[facility] Note   HPI:  79 y.o. Adult presents to clinic for post-op follow-up evaluation after colostomy reversal. She only stayed at rehab 1 night. She is home now doing PT she says. Doing better. Having Bms and eating better. Getting stronger. Feels like she is rash between legs.  Minor bleeding with BM.   Review of Systems:  No fever or chills Tolerating diet Having minor bleeding Rash in legs  All other review of systems: otherwise negative   Vital Signs:  BP (!) 155/83   Pulse 62   Temp 98 F (36.7 C) (Oral)   Resp 14   Ht 5\' 2"  (1.575 m)   Wt 163 lb (73.9 kg)   SpO2 94%   BMI 29.81 kg/m    Physical Exam:  Physical Exam Vitals reviewed.  Constitutional:      Appearance: Normal appearance.  Cardiovascular:     Rate and Rhythm: Normal rate.  Pulmonary:     Effort: Pulmonary effort is normal.  Abdominal:     General: There is no distension.     Palpations: Abdomen is soft.     Tenderness: There is no abdominal tenderness.     Comments: Staples removed, no erythema or drainage, healing incisions, steri strips placed, no signs of hernia  Genitourinary:    Comments: Yeast like rash in perineum, small external hemorrhoid Neurological:     Mental Status: She is alert.     Assessment:  79 y.o. yo Adult s/p colostomy reversal improving and having Bms. Has a yeast infection from moisture in the area. Otherwise getting better.   Plan:  No heavy lifting > 10 lbs, excessive bending, pushing, pulling, or squatting for 6-8 weeks after surgery.  Diet as tolerated. Tylenol and ibuprofen for pain. Ok to drive if can stir/ slam on breaks and back to baseline with regard to narcotics.  Will check on in a few months. Call with questions or concerns. Nystatin powder sent to pharmacy for yeast   Future Appointments  Date Time Provider [place]  12/17/2020  3:30 PM [name], Lavell Islam, MD [facility]  12/19/2020  2:45 PM Virl Cagey, MD  [facility] None  03/03/2021  2:40 PM Branch, Alphonse Guild, MD [facility]     All of the above recommendations were discussed with the patient and patient's family, and all of patient's and family's questions were answered to their expressed satisfaction.  Curlene Labrum, MD [facility] [address] [place], [place] [address] [phone] (office)

## 2020-10-15 NOTE — Patient Instructions (Addendum)
No heavy lifting > 10 lbs, excessive bending, pushing, pulling, or squatting for 6-8 weeks after surgery.  Diet as tolerated. Tylenol and ibuprofen for pain. Ok to drive if can stir/ slam on breaks and back to baseline with regard to narcotics.    Skin Yeast Infection  A skin yeast infection is a condition in which there is an overgrowth of yeast (candida) that normally lives on the skin. This condition usually occurs in areas of the skin that are constantly warm and moist, such as the armpits or the groin. What are the causes? This condition is caused by a change in the normal balance of the yeast and bacteria that live on the skin. What increases the risk? You are more likely to develop this condition if you:  Are obese.  Are pregnant.  Take birth control pills.  Have diabetes.  Take antibiotic medicines.  Take steroid medicines.  Are malnourished.  Have a weak body defense system (immune system).  Are 65 years of age or older.  Wear tight clothing. What are the signs or symptoms? The most common symptom of this condition is itchiness in the affected area. Other symptoms include:  Red, swollen area of the skin.  Bumps on the skin. How is this diagnosed?  This condition is diagnosed with a medical history and physical exam.  Your health care provider may check for yeast by taking light scrapings of the skin to be viewed under a microscope. How is this treated? This condition is treated with medicine. Medicines may be prescribed or be available over the counter. The medicines may be:  Taken by mouth (orally).  Applied as a cream or powder to your skin. Follow these instructions at home:   Take or apply over-the-counter and prescription medicines only as told by your health care provider.  Maintain a healthy weight. If you need help losing weight, talk with your health care provider.  Keep your skin clean and dry.  If you have diabetes, keep your blood sugar  under control.  Keep all follow-up visits as told by your health care provider. This is important. Contact a health care provider if:  Your symptoms go away and then return.  Your symptoms do not get better with treatment.  Your symptoms get worse.  Your rash spreads.  You have a fever or chills.  You have new symptoms.  You have new warmth or redness of your skin. Summary  A skin yeast infection is a condition in which there is an overgrowth of yeast (candida) that normally lives on the skin. This condition is caused by a change in the normal balance of the yeast and bacteria that live on the skin.  Take or apply over-the-counter and prescription medicines only as told by your health care provider.  Keep your skin clean and dry.  Contact a health care provider if your symptoms do not get better with treatment. This information is not intended to replace advice given to you by your health care provider. Make sure you discuss any questions you have with your health care provider. Document Revised: 03/01/2018 Document Reviewed: 03/01/2018 Elsevier Patient Education  [place].

## 2020-11-13 ENCOUNTER — Telehealth: Payer: Self-pay | Admitting: Family Medicine

## 2020-11-13 NOTE — Telephone Encounter (Signed)
Pt called states that she is having some pain in her lower abdomen with some nausea.   CB# [phone]  (your next available is 11/26/2020)

## 2020-11-14 NOTE — Telephone Encounter (Signed)
[name] to see in February. If pain and nausea worsen will need to go to the hospital as she could have a hernia given her prior colostomy site.  Curlene Labrum, MD [facility] [address] [place], [place] [address] [phone] (office)

## 2020-11-18 NOTE — Telephone Encounter (Signed)
Pt was call on 11/15/2020 and informed of provider recommendations and apt was made.

## 2020-11-21 ENCOUNTER — Ambulatory Visit (INDEPENDENT_AMBULATORY_CARE_PROVIDER_SITE_OTHER): Payer: Self-pay | Admitting: General Surgery

## 2020-11-21 ENCOUNTER — Other Ambulatory Visit: Payer: Self-pay

## 2020-11-21 ENCOUNTER — Encounter: Payer: Self-pay | Admitting: General Surgery

## 2020-11-21 VITALS — BP 137/78 | HR 55 | Temp 98.3°F | Resp 14 | Ht 62.0 in | Wt 163.0 lb

## 2020-11-21 DIAGNOSIS — B372 Candidiasis of skin and nail: Secondary | ICD-10-CM | POA: Insufficient documentation

## 2020-11-21 DIAGNOSIS — N390 Urinary tract infection, site not specified: Secondary | ICD-10-CM

## 2020-11-21 DIAGNOSIS — R32 Unspecified urinary incontinence: Secondary | ICD-10-CM | POA: Insufficient documentation

## 2020-11-21 MED ORDER — FLUCONAZOLE 100 MG PO TABS: 100.0000 mg | ORAL_TABLET | Freq: Every day | ORAL | 0 refills | Status: AC

## 2020-11-21 NOTE — Progress Notes (Signed)
[facility] Note   HPI:  80 y.o. Adult presents to clinic for post-op follow-up evaluation after colostomy reversal. Last visit she had some yeast in her vaginal area. Taking metamucil twice daily.   Review of Systems:  She reports leakage of urine and having some leakage of stool  Burning with urination Frequency with urination All other review of systems: otherwise negative   Vital Signs:  BP 137/78   Pulse (!) 55   Temp 98.3 F (36.8 C) (Other (Comment))   Resp 14   Ht 5\' 2"  (1.575 m)   Wt 163 lb (73.9 kg)   SpO2 98%   BMI 29.81 kg/m    Physical Exam:  Physical Exam Vitals reviewed. Exam conducted with a chaperone present.  Cardiovascular:     Rate and Rhythm: Normal rate.  Pulmonary:     Effort: Pulmonary effort is normal.  Abdominal:     General: There is no distension.     Palpations: Abdomen is soft.     Tenderness: There is abdominal tenderness.     Comments: Midline with yeast in the incision from moisture, no drainage   Genitourinary:    Labia:        Right: No rash.        Left: No rash.      Urethra: Prolapse and urethral pain present. No urethral lesion.     Vagina: Prolapsed vaginal walls present.     Rectum: No tenderness. Abnormal anal tone.     Comments: Some degree of prolapse seen of the vagina anteriorly, decreased tone of the rectum, poor squeeze, no fistula or scarring noted between rectum and vagina, no stool in the vagina Neurological:     Mental Status: She is alert.     Assessment:  80 y.o. yo Adult with recent colostomy reversal and some leakage of urine and stool. She is taking metamucil so I told her to hold on this and try to slow it down. We may have to give her imodium if she continues to have looser stools. She is not having evidence or symptoms of a fistula at this time.  She does have some degree of prolapse and says she has urinary incontinence. Also having some UTI symptoms.   Plan:  Take metamucil once daily  or every other day. Call if stools are still too loose and not improving.   Take the diflucan for the yeast infection in the abdomen.  Get your urine checked so we can prescribe antibiotics if needed.  Will find out who to refer you to for the urine issues, likely Uro-gynecology   Future Appointments  Date Time Provider [place]  11/21/2020  2:45 PM Virl Cagey, MD [facility] None  12/17/2020  3:30 PM [name], Lavell Islam, MD [facility]  12/19/2020  2:45 PM Virl Cagey, MD [facility] None  12/26/2020  1:00 PM Virl Cagey, MD [facility] None  03/03/2021  2:40 PM Branch, Alphonse Guild, MD [facility]    Curlene Labrum, MD [facility] [address] [place], [place] [address] [phone] (office)

## 2020-11-21 NOTE — Patient Instructions (Addendum)
Take metamucil once daily or every other day. Call if stools are still too loose and not improving.   Take the diflucan for the yeast infection in the abdomen.  Get your urine checked so we can prescribe antibiotics if needed.  Will find out who to refer you to for the urine issues.

## 2020-11-22 ENCOUNTER — Telehealth: Payer: Self-pay | Admitting: Family Medicine

## 2020-11-22 LAB — MICROSCOPIC EXAMINATION
Casts: NONE SEEN /lpf
Epithelial Cells (non renal): >10 /hpf — AB (ref 0–10)
RBC, Urine: NONE SEEN /hpf (ref 0–2)
WBC, UA: >30 /hpf — AB (ref 0–5)

## 2020-11-22 LAB — URINALYSIS, ROUTINE W REFLEX MICROSCOPIC
Bilirubin, UA: NEGATIVE
Glucose, UA: NEGATIVE
Nitrite, UA: NEGATIVE
RBC, UA: NEGATIVE
Specific Gravity, UA: 1.027 (ref 1.005–1.030)
Urobilinogen, Ur: 0.2 mg/dL (ref 0.2–1.0)
pH, UA: 5.5 (ref 5.0–7.5)

## 2020-11-22 MED ORDER — CIPROFLOXACIN HCL 500 MG PO TABS: 500.0000 mg | ORAL_TABLET | Freq: Two times a day (BID) | ORAL | 0 refills | Status: DC

## 2020-11-22 NOTE — Telephone Encounter (Signed)
Received results from ua and it was positive for UTI - called [facility] and added culture. Per Dr. Constance Haw pt needs to be on antibiotic - Cipro 500mg  BID x 5 days. Pt aware and med sent to pharm.

## 2020-11-27 ENCOUNTER — Other Ambulatory Visit: Payer: Self-pay | Admitting: Family Medicine

## 2020-11-27 DIAGNOSIS — R32 Unspecified urinary incontinence: Secondary | ICD-10-CM

## 2020-11-27 LAB — URINE CULTURE

## 2020-11-27 LAB — SPECIMEN STATUS REPORT

## 2020-11-27 NOTE — Telephone Encounter (Signed)
Culture came back resistant to Cipro - per Dr. Constance Haw switch to Augmentin bid x 10 days. Tried to call pt. No answer and no vm on home number. Cell number person states that is not her number.

## 2020-11-28 NOTE — Telephone Encounter (Signed)
Called pt - [place]

## 2020-12-03 NOTE — Telephone Encounter (Signed)
Tried to call patient again [facility] - will send mychart message

## 2020-12-17 ENCOUNTER — Ambulatory Visit: Payer: Medicare Other | Admitting: Infectious Disease

## 2020-12-19 ENCOUNTER — Ambulatory Visit: Payer: Medicare Other | Admitting: General Surgery

## 2020-12-21 ENCOUNTER — Other Ambulatory Visit: Payer: Self-pay

## 2020-12-21 ENCOUNTER — Encounter (HOSPITAL_COMMUNITY): Payer: Self-pay

## 2020-12-21 ENCOUNTER — Emergency Department (HOSPITAL_COMMUNITY)
Admission: EM | Admit: 2020-12-21 | Discharge: 2020-12-21 | Disposition: A | Discharge status: Home / Self Care | Payer: Medicare Other | Attending: Emergency Medicine | Admitting: Emergency Medicine

## 2020-12-21 DIAGNOSIS — Z046 Encounter for general psychiatric examination, requested by authority: Secondary | ICD-10-CM | POA: Diagnosis present

## 2020-12-21 DIAGNOSIS — I129 Hypertensive chronic kidney disease with stage 1 through stage 4 chronic kidney disease, or unspecified chronic kidney disease: Secondary | ICD-10-CM | POA: Insufficient documentation

## 2020-12-21 DIAGNOSIS — Z79899 Other long term (current) drug therapy: Secondary | ICD-10-CM | POA: Diagnosis not present

## 2020-12-21 DIAGNOSIS — F209 Schizophrenia, unspecified: Secondary | ICD-10-CM | POA: Insufficient documentation

## 2020-12-21 DIAGNOSIS — N183 Chronic kidney disease, stage 3 unspecified: Secondary | ICD-10-CM | POA: Diagnosis not present

## 2020-12-21 DIAGNOSIS — J449 Chronic obstructive pulmonary disease, unspecified: Secondary | ICD-10-CM | POA: Insufficient documentation

## 2020-12-21 DIAGNOSIS — E039 Hypothyroidism, unspecified: Secondary | ICD-10-CM | POA: Insufficient documentation

## 2020-12-21 LAB — URINALYSIS, ROUTINE W REFLEX MICROSCOPIC
Bilirubin Urine: NEGATIVE
Glucose, UA: NEGATIVE mg/dL
Hgb urine dipstick: NEGATIVE
Ketones, ur: NEGATIVE mg/dL
Leukocytes,Ua: NEGATIVE
Nitrite: NEGATIVE
Protein, ur: 100 mg/dL — AB
Specific Gravity, Urine: 1.014 (ref 1.005–1.030)
pH: 6 (ref 5.0–8.0)

## 2020-12-21 NOTE — ED Triage Notes (Signed)
Pt arrives via [place] [employer] under IVC. Per IVC paperwork, Pt hearing voices of people down the road from her named Peanut, Moon, and Milbert Coulter who are threatening kill her and call her names. Pt recently discharged from [facility]. Pt recently recovering from UTI. Pt also verbalizing having sexual frustration with her roommate during Triage.

## 2020-12-21 NOTE — Discharge Instructions (Addendum)
Please continue to take the antibiotics that you were prescribed for your urinary tract infection.  The urine sample today looks clean without any obvious signs of infection but you need to finish the complete medication prescription  You may continue to follow-up in the outpatient setting for your schizophrenia  Since you are not a danger to yourself or others we cannot uphold the involuntary commitment, you need to follow-up with your psychiatrist in the clinic.  Please call first thing in the morning to make an appointment  Emergency department for severe or worsening symptoms

## 2020-12-21 NOTE — ED Provider Notes (Signed)
[facility] EMERGENCY DEPARTMENT Provider Note   CSN: [id number] Arrival date & time: 12/21/20  1943     History Chief Complaint  Patient presents with  . Medical Clearance    [name] is a 80 y.o. adult.  HPI   Pt has a hx of schizophrenia - has been recently having increased hallucinations - and was seen at OSH at [facility] with same and had UTI, was admitted to hospital - given, ativan, geodon and seroquel at times and noted to be improved -  Here under IVC because of increased agitation - having hallucinations talking about hearing voices or people that are threatening to kill her - and call her names - taking Augmentin for UTI.  Released in last week from [facility].    Pt has difficulty answering questions due to psychosis - level 5 caveat applies   Past Medical History:  Diagnosis Date  . Anxiety   . Arthritis   . Bronchitis   . Bronchitis   . CKD (chronic kidney disease) stage 3, GFR 30-59 ml/min (HCC) 05/04/2016  . Complication of anesthesia    pt states she had a cardiac arrest during hysterectomy 1985.  Pt had several surgies since then that were uneventful  . COPD (chronic obstructive pulmonary disease) ([place])   . Depression   . Dysuria 03/27/2019  . Elevated LFTs 05/04/2016  . History of TIAs 1981   left side weakness  . HIV (human immunodeficiency virus infection) ([place])   . Hypertension   . Kidney disease related to HIV infection [facility])    pt states she has to have her creatinine level checked often   . Nausea 03/03/2017  . Peripheral neuropathy    bilateral feet  . Seasonal allergies 03/27/2019  . Sinusitis 05/06/2018  . UTI (urinary tract infection) 03/03/2017  . Weight loss 03/03/2017    Patient Active Problem List   Diagnosis Date Noted  . Incontinence of urine in female 11/21/2020  . Skin yeast infection 11/21/2020  . Mass of left side of neck 09/12/2020  . Colostomy in place [facility]) 09/12/2020  . Housing problems 06/06/2020  . Healthcare maintenance  06/06/2020  . Abdominal wall bulge 04/22/2020  . Acute renal failure superimposed on stage 3b chronic kidney disease ([place])   . History of colonic polyps 07/06/2019  . History of partial colectomy 07/06/2019  . Seasonal allergies 03/27/2019  . Dysuria 03/27/2019  . Obesity (BMI 30-39.9) 11/12/2018  . Constipation due to opioid therapy 11/12/2018  . Lactic acidosis 11/12/2018  . Large bowel perforation ([place]) 11/12/2018  . Peritonitis ([place]) 11/12/2018  . Stercoral ulcer of large intestine 11/12/2018  . Pneumoperitoneum   . COPD (chronic obstructive pulmonary disease) ([place]) 12/17/2017  . Hypothyroidism 12/17/2017  . Steroid-induced psychosis, with hallucinations ([place])   . UTI (urinary tract infection) 03/03/2017  . Nausea 03/03/2017  . Weight loss 03/03/2017  . CAP (community acquired pneumonia) 07/22/2016  . CKD (chronic kidney disease) stage 3, GFR 30-59 ml/min (HCC) 05/04/2016  . Elevated LFTs 05/04/2016  . 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 Breath shortness 06/19/2014  . Syncope 05/31/2014  . Rib fracture 05/31/2014  . Weight gain 03/21/2014  . Cervical spondylosis 08/15/2013  . Neck pain on left side 05/24/2013  . Unspecified vitamin D deficiency 12/14/2012  . Vitamin D toxicity 12/14/2012  . Hemorrhoid 09/01/2012  . Myalgia 02/25/2012  . HTN (hypertension) 11/25/2011  . Cramps, muscle, general 08/18/2011  . RECTAL BLEEDING 11/03/2010  . DYSPHAGIA 11/03/2010  . FATIGUE 08/29/2010  .  MUSCLE PAIN 06/04/2010  . Cystitis 05/12/2010  . RENAL INSUFFICIENCY 12/18/2008  . DEPRESSION 09/04/2008  . IRRITABLE BOWEL SYNDROME 05/22/2008  . HERNIATED CERVICAL DISC 10/05/2007  . HERNIATED LUMBAR DISC 10/05/2007  . History of urinary tract infection 09/20/2007  . HIV-1 associated autonomic neuropathy ([place]) 03/16/2007  . ELBOW PAIN 03/16/2007  . ISCHEMIC COLITIS 12/27/2006  . RENAL FAILURE NOS 12/27/2006  . HIV infection ([place]) 08/27/2006    Past Surgical History:  Procedure  Laterality Date  . ABDOMINAL HYSTERECTOMY  1985  . BACK SURGERY    . BIOPSY  07/15/2020   Procedure: BIOPSY;  Surgeon: Daneil Dolin, MD;  Location: [facility] ENDO SU[facility];  Service: Endoscopy;;  . CATARACT EXTRACTION, BILATERAL    . CHOLECYSTECTOMY    . COLONOSCOPY  08/10/2002   NUR: Normal colonoscopy except for some hemorrhoids and some erythema at the dentate line  . COLONOSCOPY  12/10/2006   Edwards:Diffuse colitis in the transverse and descending colon/This is not entirely typical of ischemic colitis or her HIV positive/ disease.  We need to be concerned about other causes  . COLONOSCOPY  01/19/2011   RMR: Internal and external hemorrhoids, likely source of hematochezia anal papilla, otherwise normal rectal mucosa/ Left-sided diverticula.  Cecal polyp with status post cold snare polypectomy.  Remainder of colonic mucosa appeared normal. Pathology with tubular adenoma. Repeat in 2017  . COLONOSCOPY WITH PROPOFOL N/A 07/15/2020   Procedure: COLONOSCOPY WITH PROPOFOL;  Surgeon: Daneil Dolin, MD;  Location: [facility] ENDO SUITE;  Service: Endoscopy;  Laterality: N/A;  8:15AM TCS via Ostomy  . COLOSTOMY N/A 11/12/2018   Procedure: COLOSTOMY;  Surgeon: Virl Cagey, MD;  Location: [facility] ORS;  Service: General;  Laterality: N/A;  . COLOSTOMY REVERSAL N/A 10/02/2020   Procedure: COLOSTOMY REVERSAL;  Surgeon: Virl Cagey, MD;  Location: [facility] ORS;  Service: General;  Laterality: N/A;  . ESOPHAGOGASTRODUODENOSCOPY  08/10/2002     NUR: Mild changes of reflux esophagitis limited to gastroesophageal junction  No evidence of ring, stricture, or esophageal candidiasis dysphagia/ Gastritis, possibly H. pylori induced  . ESOPHAGOGASTRODUODENOSCOPY  12/25/2002   NUR: Erosive antral gastritis.  The degree of gastritis is more significant than her last exam/ Normal examination of the esophagus/ Esophageal dilatation performed by passing 44 and [address] [place]  . ESOPHAGOGASTRODUODENOSCOPY  11/13/2010   RMR:  Circumferential distal esophageal erosions with soft peptic stricture, consistent with erosive reflux esophagitis or stricture  formation, status post dilation as described above/  Small hiatal hernia/ Tiny antral erosions, otherwise normal stomach D1 and D2  . ESOPHAGOGASTRODUODENOSCOPY (EGD) WITH PROPOFOL N/A 07/15/2020   Procedure: ESOPHAGOGASTRODUODENOSCOPY (EGD) WITH PROPOFOL;  Surgeon: Daneil Dolin, MD;  Location: [facility];  Service: Endoscopy;  Laterality: N/A;  . FLEXIBLE SIGMOIDOSCOPY N/A 07/15/2020   Procedure: FLEXIBLE SIGMOIDOSCOPY;  Surgeon: Daneil Dolin, MD;  Location: [facility] END[facility] SU[facility];  Service: Endoscopy;  Laterality: N/A;  . HEMORRHOID SURGERY  09/02/2012   Procedure: HEMORRHOIDECTOMY;  Surgeon: Jamesetta So, MD;  Location: [facility] ORS;  Service: General;  Laterality: N/A;  . LAPAROTOMY N/A 11/12/2018   Procedure: EXPLORATORY LAPAROTOMY;  Surgeon: Virl Cagey, MD;  Location: [facility] ORS;  Service: General;  Laterality: N/A;  Venia Minks DILATION N/A 07/15/2020   Procedure: Keturah Shavers;  Surgeon: Daneil Dolin, MD;  Location: [facility];  Service: Endoscopy;  Laterality: N/A;  . PARTIAL COLECTOMY N/A 11/12/2018   Procedure: PARTIAL COLECTOMY;  Surgeon: Virl Cagey, MD;  Location: [facility] ORS;  Service: General;  Laterality: N/A;  . POLYPECTOMY  07/15/2020   Procedure: POLYPECTOMY;  Surgeon: Daneil Dolin, MD;  Location: [facility];  Service: Endoscopy;;  . TUBAL LIGATION       OB History    Gravida  4   Para  3   Term  2   Preterm  1   AB  1   Living  3     SAB  1   IAB      Ectopic      Multiple      Live Births  3           Family History  Problem Relation Age of Onset  . Diabetes Mother   . Diabetes Brother   . Colon cancer Brother        Diagnosed > age 75    Social History   Tobacco Use  . Smoking status: Never Smoker  . Smokeless tobacco: Never Used  Vaping Use  . Vaping Use: Never used  Substance Use Topics  .  Alcohol use: No  . Drug use: No    Home Medications Prior to Admission medications   Medication Sig Start Date End Date Taking? Authorizing Provider  abacavir-dolutegravir-lamiVUDine (TRIUMEQ) 600-50-300 MG tablet Take 1 tablet by mouth daily. 06/06/20   Golden Circle, FNP  acetaminophen (TYLENOL) 500 MG tablet Take 2 tablets (1,000 mg total) by mouth every 6 (six) hours as needed for mild pain or moderate pain. 10/08/20   Virl Cagey, MD  albuterol (PROVENTIL HFA;VENTOLIN HFA) 108 (90 Base) MCG/ACT inhaler Inhale 1-2 puffs into the lungs every 6 (six) hours as needed for wheezing or shortness of breath. 04/02/17   Nat Christen, MD  Cholecalciferol (VITAMIN D3) 10000 units TABS Take 1 tablet by mouth daily.    [provider]  ciprofloxacin (CIPRO) 500 MG tablet Take 1 tablet (500 mg total) by mouth 2 (two) times daily. 11/22/20   Virl Cagey, MD  diphenhydrAMINE (BENADRYL) 25 mg capsule Take 25 mg by mouth daily as needed for itching (itching from codeine).     [provider]  docusate sodium (COLACE) 100 MG capsule Take 1 capsule (100 mg total) by mouth 2 (two) times daily. 11/21/18   Virl Cagey, MD  DULoxetine (CYMBALTA) 20 MG capsule Take 20 mg by mouth at bedtime.    [provider]  fluticasone (FLONASE) 50 MCG/ACT nasal spray 2 SPRAYS INTO BOTH NOSTRILS AT BEDTIME. Patient taking differently: Place 2 sprays into both nostrils at bedtime. 05/22/19   Truman Hayward, MD  furosemide (LASIX) 40 MG tablet Take 1 tablet (40 mg total) by mouth daily as needed for fluid. Hold until follow up with PCP 12/31/19   Barton Dubois, MD  ipratropium-albuterol (DUONEB) 0.5-2.5 (3) MG/3ML SOLN Inhale 3 mLs into the lungs every 6 (six) hours as needed (shortness of breath).  04/02/17   [provider]  LORazepam (ATIVAN) 0.5 MG tablet Take 0.5 mg by mouth 2 (two) times daily.  11/01/18   [provider]  magnesium 30 MG tablet Take 30 mg by  mouth at bedtime.     [provider]  Multiple Vitamin (ONE-A-DAY 55 PLUS PO) Take 1 tablet by mouth daily.    [provider]  nebivolol (BYSTOLIC) 5 MG tablet Take 1 tablet (5 mg total) by mouth daily. 02/27/20   Arnoldo Lenis, MD  nystatin (MYCOSTATIN/NYSTOP) powder Apply 1 application topically 3 (three) times daily.  Apply to groin region until redness and irritation improving 10/15/20   Virl Cagey, MD  Omega-3 Fatty Acids (FISH OIL BURP-LESS) 1000 MG CAPS Take 1 capsule by mouth daily.    [provider]  ondansetron (ZOFRAN) 8 MG tablet Take 8 mg by mouth daily as needed for nausea or vomiting.     [provider]  pantoprazole (PROTONIX) 40 MG tablet Take 1 tablet (40 mg total) by mouth daily before breakfast. 07/16/20 07/16/21  Erenest Rasher, PA-C  polyvinyl alcohol (LIQUIFILM TEARS) 1.4 % ophthalmic solution Place 1 drop into both eyes daily.    [provider]  psyllium (HYDROCIL/METAMUCIL) 95 % PACK Take 1 packet by mouth daily. 10/09/20   Virl Cagey, MD  thiamine (VITAMIN B-1) 100 MG tablet Take 100 mg by mouth daily.    [provider]    Allergies    Bee venom, Promethazine, Tenofovir disoproxil fumarate [tenofovir disoproxil fumarate], Compazine  [prochlorperazine edisylate], Haloperidol lactate, Sulfa antibiotics, Brompheniramine-acetaminophen, Chlorcyclizine, Chlorpromazine, Codeine, Navane [thiothixene], Prednisone, Promethazine hcl, Propoxyphene n-acetaminophen, and Morphine  Review of Systems   Review of Systems  Unable to perform ROS: Psychiatric disorder    Physical Exam Updated Vital Signs BP (!) 151/92 (BP Location: Right Arm)   Pulse (!) 55   Temp 98 F (36.7 C) (Oral)   Resp 18   Ht 1.575 m (5\' 2" )   Wt 74.8 kg   SpO2 94%   BMI 30.18 kg/m   Physical Exam Vitals and nursing note reviewed.  Constitutional:      General: She is not in acute distress.    Appearance: She is  well-developed and well-nourished.  HENT:     Head: Normocephalic and atraumatic.     Mouth/Throat:     Mouth: Oropharynx is clear and moist.     Pharynx: No oropharyngeal exudate.  Eyes:     General: No scleral icterus.       Right eye: No discharge.        Left eye: No discharge.     Extraocular Movements: EOM normal.     Conjunctiva/sclera: Conjunctivae normal.     Pupils: Pupils are equal, round, and reactive to light.  Neck:     Thyroid: No thyromegaly.     Vascular: No JVD.  Cardiovascular:     Rate and Rhythm: Normal rate and regular rhythm.     Pulses: Intact distal pulses.     Heart sounds: Normal heart sounds. No murmur heard. No friction rub. No gallop.   Pulmonary:     Effort: Pulmonary effort is normal. No respiratory distress.     Breath sounds: Normal breath sounds. No wheezing or rales.  Abdominal:     General: Bowel sounds are normal. There is no distension.     Palpations: Abdomen is soft. There is no mass.     Tenderness: There is no abdominal tenderness.  Musculoskeletal:        General: No tenderness or edema. Normal range of motion.     Cervical back: Normal range of motion and neck supple.  Lymphadenopathy:     Cervical: No cervical adenopathy.  Skin:    General: Skin is warm and dry.     Findings: No erythema or rash.  Neurological:     Mental Status: She is alert.     Coordination: Coordination normal.  Psychiatric:        Mood and Affect: Mood and affect normal.     Comments: No active  hallucinations - states she hears voices from time to time - she has no suicidal thoughts - she is not depressed - she has a normal affect.     ED Results / Procedures / Treatments   Labs (all labs ordered are listed, but only abnormal results are displayed) Labs Reviewed  URINALYSIS, ROUTINE W REFLEX MICROSCOPIC - Abnormal; Notable for the following components:      Result Value   Protein, ur 100 (*)    Bacteria, UA RARE (*)    All other components within  normal limits    EKG None  Radiology No results found.  Procedures Procedures   Medications Ordered in ED Medications - No data to display  ED Course  I have reviewed the triage vital signs and the nursing notes.  Pertinent labs & imaging results that were available during my care of the patient were reviewed by me and considered in my medical decision making (see chart for details).    MDM Rules/Calculators/A&P                          I have called and left a message with [name], this is the daughter-in-law who took out the IVC papers.  At this time the patient is not dangerous to her self or others.  In fact in the IVC papers there is no comment on the patient being dangerous to her self or others either.  The patient does not feel threatened, she does not feel unsafe, she does not know why she is here.  She is not responding to internal stimuli at this time, her vital signs are reassuring, will check a urinalysis to make sure that it is cleared up.  The patient continues to be very calm, pleasant, engages in conversation very easily and has no evidence of decompensated schizophrenia, she is not actively hallucinating, she is not a danger to herself or others.  Urinalysis is clean, patient stable for discharge, overturned IVC  Final Clinical Impression(s) / ED Diagnoses Final diagnoses:  Schizophrenia, unspecified type [facility]    Rx / [place] Orders ED Discharge Orders    None       Noemi Chapel, MD 12/21/20 2209

## 2020-12-23 ENCOUNTER — Other Ambulatory Visit: Payer: Self-pay

## 2020-12-23 ENCOUNTER — Emergency Department (HOSPITAL_COMMUNITY)
Admission: EM | Admit: 2020-12-23 | Discharge: 2020-12-27 | Payer: Medicare Other | Attending: Emergency Medicine | Admitting: Emergency Medicine

## 2020-12-23 ENCOUNTER — Emergency Department (HOSPITAL_COMMUNITY): Payer: Medicare Other

## 2020-12-23 ENCOUNTER — Encounter (HOSPITAL_COMMUNITY): Payer: Self-pay | Admitting: Emergency Medicine

## 2020-12-23 DIAGNOSIS — F29 Unspecified psychosis not due to a substance or known physiological condition: Secondary | ICD-10-CM | POA: Diagnosis not present

## 2020-12-23 DIAGNOSIS — Z79899 Other long term (current) drug therapy: Secondary | ICD-10-CM | POA: Insufficient documentation

## 2020-12-23 DIAGNOSIS — Z7951 Long term (current) use of inhaled steroids: Secondary | ICD-10-CM | POA: Insufficient documentation

## 2020-12-23 DIAGNOSIS — I129 Hypertensive chronic kidney disease with stage 1 through stage 4 chronic kidney disease, or unspecified chronic kidney disease: Secondary | ICD-10-CM | POA: Diagnosis not present

## 2020-12-23 DIAGNOSIS — Z21 Asymptomatic human immunodeficiency virus [HIV] infection status: Secondary | ICD-10-CM | POA: Insufficient documentation

## 2020-12-23 DIAGNOSIS — Z20822 Contact with and (suspected) exposure to covid-19: Secondary | ICD-10-CM | POA: Insufficient documentation

## 2020-12-23 DIAGNOSIS — J449 Chronic obstructive pulmonary disease, unspecified: Secondary | ICD-10-CM | POA: Insufficient documentation

## 2020-12-23 DIAGNOSIS — N183 Chronic kidney disease, stage 3 unspecified: Secondary | ICD-10-CM | POA: Diagnosis not present

## 2020-12-23 DIAGNOSIS — R443 Hallucinations, unspecified: Secondary | ICD-10-CM

## 2020-12-23 DIAGNOSIS — Z046 Encounter for general psychiatric examination, requested by authority: Secondary | ICD-10-CM | POA: Diagnosis present

## 2020-12-23 LAB — CBC WITH DIFFERENTIAL/PLATELET
Abs Immature Granulocytes: 0.02 10*3/uL (ref 0.00–0.07)
Basophils Absolute: 0 10*3/uL (ref 0.0–0.1)
Basophils Relative: 1 %
Eosinophils Absolute: 0.1 10*3/uL (ref 0.0–0.5)
Eosinophils Relative: 2 %
HCT: 40.3 % (ref 36.0–46.0)
Hemoglobin: 13.5 g/dL (ref 12.0–15.0)
Immature Granulocytes: 0 %
Lymphocytes Relative: 38 %
Lymphs Abs: 2.3 10*3/uL (ref 0.7–4.0)
MCH: 34.4 pg — ABNORMAL HIGH (ref 26.0–34.0)
MCHC: 33.5 g/dL (ref 30.0–36.0)
MCV: 102.5 fL — ABNORMAL HIGH (ref 80.0–100.0)
Monocytes Absolute: 0.5 10*3/uL (ref 0.1–1.0)
Monocytes Relative: 8 %
Neutro Abs: 3 10*3/uL (ref 1.7–7.7)
Neutrophils Relative %: 51 %
Platelets: 217 10*3/uL (ref 150–400)
RBC: 3.93 MIL/uL (ref 3.87–5.11)
RDW: 11.8 % (ref 11.5–15.5)
WBC: 5.9 10*3/uL (ref 4.0–10.5)
nRBC: 0 % (ref 0.0–0.2)

## 2020-12-23 LAB — COMPREHENSIVE METABOLIC PANEL
ALT: 18 U/L (ref 0–44)
AST: 18 U/L (ref 15–41)
Albumin: 4.1 g/dL (ref 3.5–5.0)
Alkaline Phosphatase: 62 U/L (ref 38–126)
Anion gap: 9 (ref 5–15)
BUN: 30 mg/dL — ABNORMAL HIGH (ref 8–23)
CO2: 26 mmol/L (ref 22–32)
Calcium: 10.5 mg/dL — ABNORMAL HIGH (ref 8.9–10.3)
Chloride: 104 mmol/L (ref 98–111)
Creatinine, Ser: 1.4 mg/dL — ABNORMAL HIGH (ref 0.44–1.00)
GFR, Estimated: 38 mL/min — ABNORMAL LOW (ref >60)
Glucose, Bld: 144 mg/dL — ABNORMAL HIGH (ref 70–99)
Potassium: 4 mmol/L (ref 3.5–5.1)
Sodium: 139 mmol/L (ref 135–145)
Total Bilirubin: 0.6 mg/dL (ref 0.3–1.2)
Total Protein: 7 g/dL (ref 6.5–8.1)

## 2020-12-23 LAB — URINALYSIS, ROUTINE W REFLEX MICROSCOPIC
Bacteria, UA: NONE SEEN
Bilirubin Urine: NEGATIVE
Glucose, UA: NEGATIVE mg/dL
Hgb urine dipstick: NEGATIVE
Ketones, ur: NEGATIVE mg/dL
Leukocytes,Ua: NEGATIVE
Nitrite: NEGATIVE
Protein, ur: 30 mg/dL — AB
Specific Gravity, Urine: 1.01 (ref 1.005–1.030)
pH: 6 (ref 5.0–8.0)

## 2020-12-23 LAB — RESP PANEL BY RT-PCR (FLU A&B, COVID) ARPGX2
Influenza A by PCR: NEGATIVE
Influenza B by PCR: NEGATIVE
SARS Coronavirus 2 by RT PCR: NEGATIVE

## 2020-12-23 LAB — RAPID URINE DRUG SCREEN, HOSP PERFORMED
Amphetamines: NOT DETECTED
Barbiturates: NOT DETECTED
Benzodiazepines: NOT DETECTED
Cocaine: NOT DETECTED
Opiates: NOT DETECTED
Tetrahydrocannabinol: NOT DETECTED

## 2020-12-23 LAB — ETHANOL: Alcohol, Ethyl (B): <10 mg/dL (ref <10)

## 2020-12-23 LAB — AMMONIA: Ammonia: 11 umol/L (ref 9–35)

## 2020-12-23 MED ORDER — DOCUSATE SODIUM 100 MG PO CAPS
100.0000 mg | ORAL_CAPSULE | Freq: Two times a day (BID) | ORAL | Status: DC
  Administered 2020-12-23 – 2020-12-26 (×7): 100 mg via ORAL
  Filled 2020-12-23 (×7): qty 1

## 2020-12-23 MED ORDER — FLUTICASONE PROPIONATE 50 MCG/ACT NA SUSP
2.0000 | Freq: Every day | NASAL | Status: DC
  Administered 2020-12-23 – 2020-12-26 (×3): 2 via NASAL
  Filled 2020-12-23 (×2): qty 16

## 2020-12-23 MED ORDER — DULOXETINE HCL 20 MG PO CPEP
20.0000 mg | ORAL_CAPSULE | Freq: Every day | ORAL | Status: DC
  Administered 2020-12-23 – 2020-12-26 (×4): 20 mg via ORAL
  Filled 2020-12-23 (×4): qty 1

## 2020-12-23 MED ORDER — NEBIVOLOL HCL 2.5 MG PO TABS
5.0000 mg | ORAL_TABLET | Freq: Every day | ORAL | Status: DC
  Administered 2020-12-24 – 2020-12-26 (×3): 5 mg via ORAL
  Filled 2020-12-23 (×2): qty 2
  Filled 2020-12-23 (×2): qty 1
  Filled 2020-12-23 (×4): qty 2

## 2020-12-23 MED ORDER — QUETIAPINE FUMARATE 25 MG PO TABS
25.0000 mg | ORAL_TABLET | Freq: Every day | ORAL | Status: DC
  Administered 2020-12-23: 25 mg via ORAL
  Filled 2020-12-23: qty 1

## 2020-12-23 MED ORDER — THIAMINE HCL 100 MG PO TABS
100.0000 mg | ORAL_TABLET | Freq: Every day | ORAL | Status: DC
  Administered 2020-12-24 – 2020-12-26 (×3): 100 mg via ORAL
  Filled 2020-12-23 (×5): qty 1

## 2020-12-23 MED ORDER — LOSARTAN POTASSIUM 25 MG PO TABS
50.0000 mg | ORAL_TABLET | Freq: Every day | ORAL | Status: DC
  Administered 2020-12-23 – 2020-12-26 (×4): 50 mg via ORAL
  Filled 2020-12-23 (×4): qty 2

## 2020-12-23 MED ORDER — ABACAVIR-DOLUTEGRAVIR-LAMIVUD 600-50-300 MG PO TABS
1.0000 | ORAL_TABLET | Freq: Every day | ORAL | Status: DC
  Administered 2020-12-24 – 2020-12-26 (×3): 1 via ORAL
  Filled 2020-12-23 (×8): qty 1

## 2020-12-23 MED ORDER — PANTOPRAZOLE SODIUM 40 MG PO TBEC
40.0000 mg | DELAYED_RELEASE_TABLET | Freq: Every day | ORAL | Status: DC
  Administered 2020-12-24 – 2020-12-26 (×3): 40 mg via ORAL
  Filled 2020-12-23 (×3): qty 1

## 2020-12-23 NOTE — ED Provider Notes (Signed)
  Provider Note MRN:  [id number]  Arrival date & time: 12/24/20    ED Course and Medical Decision Making  Assumed care from PA Norwood [name] at shift change.  Worsening hallucinations, history of psychosis, shooting a gun in the air earlier today, IVC paperwork is complete, awaiting HIV lab reassessment.  Suspect will need Geri psych admission.  7 AM update: Still awaiting HIV labs, suspect they are send outs.  At this point I do not feel they would change the disposition, signed out to default provider.  Medically cleared.  Procedures  Final Clinical Impressions(s) / ED Diagnoses     ICD-10-CM   1. Hallucinations  R44.3     ED Discharge Orders    None      Discharge Instructions   None     Barth Kirks. [place]onia Small, [place] [email or web address]    Maudie Flakes, MD 12/24/20 [phone]

## 2020-12-23 NOTE — ED Notes (Signed)
EKG done by Colletta [place]., NT but it isn't crossing over to Epic at this time.

## 2020-12-23 NOTE — ED Notes (Signed)
Blood being collected at this time

## 2020-12-23 NOTE — ED Triage Notes (Signed)
Pt states pt was recently discharged from [facility], [facility]. Pt states she went to PCP and was told to be come to ED for eval and have medications changed.   Pt endorses auditory and visual hallucinations and HI thoughts towards neighbors.  Pt states " I hear voices that curse me out" denies SI at this time.
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
## 2020-12-23 NOTE — ED Notes (Signed)
Patient transported to CT 

## 2020-12-23 NOTE — ED Notes (Signed)
Pt is starting to hallucinate, she is seeing a man in the room and stating that he was talking about her, pt is also talking to her self

## 2020-12-24 LAB — T-HELPER CELLS (CD4) COUNT (NOT AT ARMC)
CD4 % Helper T Cell: 30 % — ABNORMAL LOW (ref 33–65)
CD4 T Cell Abs: 687 /uL (ref 400–1790)

## 2020-12-24 LAB — RPR: RPR Ser Ql: NONREACTIVE

## 2020-12-24 MED ORDER — LORAZEPAM 1 MG PO TABS
1.0000 mg | ORAL_TABLET | Freq: Once | ORAL | Status: AC
  Administered 2020-12-24: 1 mg via ORAL
  Filled 2020-12-24: qty 1

## 2020-12-24 MED ORDER — QUETIAPINE FUMARATE 25 MG PO TABS
25.0000 mg | ORAL_TABLET | Freq: Every morning | ORAL | Status: DC
  Administered 2020-12-25 – 2020-12-26 (×2): 25 mg via ORAL
  Filled 2020-12-24 (×2): qty 1

## 2020-12-24 MED ORDER — QUETIAPINE FUMARATE 25 MG PO TABS
50.0000 mg | ORAL_TABLET | Freq: Every day | ORAL | Status: DC
  Administered 2020-12-24 – 2020-12-26 (×3): 50 mg via ORAL
  Filled 2020-12-24 (×4): qty 2

## 2020-12-24 NOTE — Care Management (Signed)
[facility] is at capacity, per [facility].

## 2020-12-24 NOTE — ED Notes (Addendum)
Pt is crying loudly in and saying that 'someone' told her "[facility] died [facility]". Pt is being redirected. Will continue to monitor pt.

## 2020-12-24 NOTE — BH Assessment (Signed)
Margorie John, PA, patient meets inpatient criteria. Geropsychiatry is recommended. Disposition SW will secure placement in the AM.

## 2020-12-24 NOTE — Progress Notes (Signed)
Pt. meets criteria for inpatient treatment per Margorie John, PA. Referred out to the following hospitals:   [facility] Provider Address Phone Fax  [facility]  [address] [place] [address] [phone]  [place], [place] [address] [phone] [facility]  [address] [place] [address] [phone] [facility]  [place], [place] [address] [phone]  [facility]  [address] [place], [place] [address] [phone] [facility]  [address] [place]  [address] [phone]  [facility]  [address] [place] [address] [phone] [place]  [address] [place] [address] [phone]     Disposition CSW will continue to follow for placement    Signed:  Durenda Hurt, MSW, [place], LCASA 12/24/2020 11:58 AM

## 2020-12-24 NOTE — ED Notes (Signed)
Pt being assessed by TTS
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
## 2020-12-25 LAB — RESP PANEL BY RT-PCR (FLU A&B, COVID) ARPGX2
Influenza A by PCR: NEGATIVE
Influenza B by PCR: NEGATIVE
SARS Coronavirus 2 by RT PCR: NEGATIVE

## 2020-12-25 LAB — HIV-1 RNA QUANT-NO REFLEX-BLD
HIV 1 RNA Quant: <20 copies/mL
LOG10 HIV-1 RNA: UNDETERMINED log10copy/mL

## 2020-12-25 NOTE — Progress Notes (Signed)
Pt. meets criteria for inpatient treatment per Margorie John, NP. Referred out to [facility] inpatient geriatric psychiatry unit in addition to facilities noted in prior note.    Disposition CSW will continue to follow for placement.  Signed:  Durenda Hurt, MSW, [place], LCASA 12/25/2020 1:53 PM

## 2020-12-25 NOTE — ED Notes (Signed)
Faxed pts. IVC paperwork to A[facility] at [facility].

## 2020-12-25 NOTE — ED Notes (Signed)
IVC papers faxed to [facility] at [facility]
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
## 2020-12-25 NOTE — Care Management (Signed)
Per [name], patient is being reviewed for placement.  [name] is requesting a copy of the IVC and a new covid test completed and faxed to [phone].    Writer informed the RN of the need for the new COVID test.

## 2020-12-26 ENCOUNTER — Emergency Department (HOSPITAL_COMMUNITY): Payer: Medicare Other

## 2020-12-26 ENCOUNTER — Ambulatory Visit: Payer: Medicare Other | Admitting: General Surgery

## 2020-12-26 NOTE — BH Assessment (Addendum)
Patient is tentatively accepted to [facility] pending Chest Xray, EKG, and COVID. Items have been faxed to the facility.   Also informed that the Finding and Custody Form (Section B) has not been signed. Boykin Nearing is asking for this to be corrected and re faxed to [phone]. [facility] nursing Urban Gibson, RN) provided updates via secure chat.  Also, discussed with nursing secretary  Marjorie Smolder) who agreed to follow up.

## 2020-12-26 NOTE — ED Notes (Signed)
IVC papers faxed to [facility]

## 2020-12-26 NOTE — BH Assessment (Addendum)
@  [place] at [facility] states that patient is under review. Awaiting the physician to review the referral packet.

## 2020-12-26 NOTE — BH Assessment (Signed)
Patient remains under review for admission to [facility], per Intake Nurse Caryl Pina, RN) at [facility].

## 2020-12-26 NOTE — ED Notes (Signed)
Patient eating lunch - notified patient that when she finished lunch we needed an EKG  Radiology came and stated they would be back in 10-15 minutes to do portable chest xray

## 2020-12-26 NOTE — BH Assessment (Addendum)
@  1223, Patient is tentatively accepted to [facility] pending Chest Xray, EKG, and COVID.

## 2020-12-26 NOTE — BH Assessment (Addendum)
Informed by [place] that IVC papers were corrected and re-faxed to [facility].   Disposition Counselor called [facility] to confirm that they have everything needed to accept patient. Informed by [place] that the Intake Nurse has left the night and will return tomorrow.   Disposition Counselor/LCSW advised to follow up in the am regarding patient's acceptance.

## 2020-12-27 DIAGNOSIS — J449 Chronic obstructive pulmonary disease, unspecified: Secondary | ICD-10-CM | POA: Insufficient documentation

## 2020-12-27 MED ORDER — LORAZEPAM 1 MG PO TABS
2.0000 mg | ORAL_TABLET | Freq: Once | ORAL | Status: AC
  Administered 2020-12-27: 2 mg via ORAL
  Filled 2020-12-27: qty 2

## 2020-12-27 MED ORDER — LORAZEPAM 2 MG/ML IJ SOLN: 2.0000 mg | Freq: Once | INTRAMUSCULAR | Status: DC

## 2020-12-27 NOTE — ED Notes (Signed)
Report called to [place] at [place]

## 2020-12-27 NOTE — ED Notes (Signed)
Handoff given to PD.  Pt assisted to PD car with staff.  Belongings sent with pt.

## 2020-12-27 NOTE — ED Notes (Signed)
Pt assisted to [facility] via WC. °

## 2020-12-27 NOTE — ED Notes (Signed)
[facility] called to set up transport at this time.

## 2020-12-27 NOTE — ED Provider Notes (Addendum)
Emergency Medicine Observation Re-evaluation Note  [name] is a 80 y.o. adult, seen on rounds today.  Pt initially presented to the ED for complaints of Hallucinations Currently, the patient is resting comfortably.  Physical Exam  BP (!) 124/56 (BP Location: Right Arm)   Pulse (!) 57   Temp 98.3 F (36.8 C) (Oral)   Resp 20   Ht 1.575 m (5\' 2" )   Wt 74.4 kg   SpO2 100%   BMI 30.00 kg/m  Physical Exam General: resting. Cardiac: regular rate Lungs: breathing comfortably Psych: resting, calm.   ED Course / MDM   I have reviewed the labs performed to date as well as medications administered while in observation.  Recent changes in the last 24 hours include stabilization on meds, [place] team reassessment, placement efforts.   Plan  Current plan is for [facility] placement, possibly to [facility].     Lajean Saver, MD 12/27/20 [phone]  Patient accepted to [facility], Dr Jamse Mead. Pt appears stable for transfer.      Lajean Saver, MD 12/27/20 [phone]

## 2020-12-27 NOTE — Discharge Instructions (Addendum)
It was our pleasure to provide your ER care today - we hope that you feel better.  Transfer to [facility].

## 2020-12-27 NOTE — Progress Notes (Signed)
Pt accepted to [place] inpatient criteria per Margorie John, PA.   Dr.Lekowuwa is the attending provider.    Call report to [phone]    Ala Bent, RN @ [facility] notified via secure chat.     Pt is IVC.    Pt may be transported by [facility]   Pt scheduled  to arrive at [place] at earliest convenience.   Signed:  Durenda Hurt, MSW, [place], LCASA 12/27/2020 7:50 AM

## 2021-01-03 ENCOUNTER — Other Ambulatory Visit: Payer: Self-pay | Admitting: Family

## 2021-01-03 DIAGNOSIS — Z21 Asymptomatic human immunodeficiency virus [HIV] infection status: Secondary | ICD-10-CM

## 2021-02-05 DIAGNOSIS — R1312 Dysphagia, oropharyngeal phase: Secondary | ICD-10-CM | POA: Insufficient documentation

## 2021-02-12 ENCOUNTER — Telehealth: Payer: Self-pay

## 2021-02-12 ENCOUNTER — Other Ambulatory Visit: Payer: Self-pay

## 2021-02-12 DIAGNOSIS — Z21 Asymptomatic human immunodeficiency virus [HIV] infection status: Secondary | ICD-10-CM

## 2021-02-12 MED ORDER — TRIUMEQ 600-50-300 MG PO TABS: 1.0000 | ORAL_TABLET | Freq: Every day | ORAL | 0 refills | Status: DC

## 2021-02-12 NOTE — Telephone Encounter (Signed)
Yes, that is fine. 

## 2021-02-12 NOTE — Telephone Encounter (Signed)
Patient's sister [name] called in regards to the patient needing refills on her Triumeq. Patient's sister states patient is currently in a SNF [facility]) in [place]. Patient will be there for the next month for rehab and her sister is not sure when she will be able to come in for a follow up. I have spoke to the nurse April Carey with the facility and she stated it would be ok to fax the RX to her them Attn April. Fax number is [phone].  [name] can you sign RX? [name] [name]

## 2021-02-13 NOTE — Telephone Encounter (Signed)
I spoke to the SNF to confirm that they received the patient RX that was faxed. They have received the RX and will get the medication to the patient Per the SNF patient should be getting discharged from the facility in the next 2 weeks. Patient's sister also advised once the patient is home and settled to call the office to get her scheduled for a follow up.  [name] T Brooks Sailors

## 2021-03-03 ENCOUNTER — Ambulatory Visit: Payer: Medicare Other | Admitting: Cardiology

## 2021-03-03 NOTE — Progress Notes (Deleted)
Clinical Summary [name] is a 80 y.o.adult  seen today for follow up of the following medical problems.   1. Afib, postoperative - new diagnosis during Jan 2020 admission - occurred postop after abdominal surgery - short duration of afib <6 hrs. Was not committed to anticoag given isolated episode postop.CHADS2Vasc score is 98 (age x2, gender, TIA x 2, HTN.     - occasional palpitations, lasts just a few seconds - did not lower her bystolic to 5mg  as discussed last visit, at that time had low HRs in the 40s  2. HTN - has not taken meds yet today   3. Preoperative evaluation  considering colostomy reversal - can walk a flight of stairs without symptoms   Past Medical History:  Diagnosis Date  . Anxiety   . Arthritis   . Bronchitis   . Bronchitis   . CKD (chronic kidney disease) stage 3, GFR 30-59 ml/min (HCC) 05/04/2016  . Complication of anesthesia    pt states she had a cardiac arrest during hysterectomy 1985.  Pt had several surgies since then that were uneventful  . COPD (chronic obstructive pulmonary disease) ([place])   . Depression   . Dysuria 03/27/2019  . Elevated LFTs 05/04/2016  . History of TIAs 1981   left side weakness  . HIV (human immunodeficiency virus infection) ([place])   . Hypertension   . Kidney disease related to HIV infection [facility])    pt states she has to have her creatinine level checked often   . Nausea 03/03/2017  . Peripheral neuropathy    bilateral feet  . Seasonal allergies 03/27/2019  . Sinusitis 05/06/2018  . UTI (urinary tract infection) 03/03/2017  . Weight loss 03/03/2017     Allergies  Allergen Reactions  . Bee Venom Anaphylaxis  . Promethazine Swelling  . Tenofovir Disoproxil Fumarate [Tenofovir Disoproxil Fumarate] Other (See Comments)    Renal failure, (08/27/2006)  . Compazine  [Prochlorperazine Edisylate] Nausea And Vomiting  . Haloperidol Lactate Other (See Comments)    Reaction is muscle tension, causes severe spasms  in face and neck. Forces eyes to roll in the back of the head  . Sulfa Antibiotics Diarrhea  . Brompheniramine-Acetaminophen   . Chlorcyclizine   . Chlorpromazine   . Codeine Itching and Nausea And Vomiting  . Navane [Thiothixene] Other (See Comments) and Nausea And Vomiting    Same muscle spasm reaction as haldol  . Prednisone     Brief mild psychosis and agitation  . Promethazine Hcl Other (See Comments)  . Propoxyphene N-Acetaminophen Itching and Nausea And Vomiting  . Morphine Itching and Swelling     Current Outpatient Medications  Medication Sig Dispense Refill  . abacavir-dolutegravir-lamiVUDine (TRIUMEQ) 600-50-300 MG tablet Take 1 tablet by mouth daily. 30 tablet 0  . acetaminophen (TYLENOL) 500 MG tablet Take 2 tablets (1,000 mg total) by mouth every 6 (six) hours as needed for mild pain or moderate pain. 30 tablet 0  . albuterol (PROVENTIL HFA;VENTOLIN HFA) 108 (90 Base) MCG/ACT inhaler Inhale 1-2 puffs into the lungs every 6 (six) hours as needed for wheezing or shortness of breath. 1 Inhaler 0  . Cholecalciferol (VITAMIN D3) 10000 units TABS Take 1 tablet by mouth daily. (Patient not taking: No sig reported)    . diphenhydrAMINE (BENADRYL) 25 mg capsule Take 25 mg by mouth daily as needed for itching (itching from codeine).     Marland Kitchen docusate sodium (COLACE) 100 MG capsule Take 1 capsule (100 mg total) by  mouth 2 (two) times daily. 10 capsule 0  . DULoxetine (CYMBALTA) 20 MG capsule Take 20 mg by mouth at bedtime.    . fluticasone (FLONASE) 50 MCG/ACT nasal spray 2 SPRAYS INTO BOTH NOSTRILS AT BEDTIME. (Patient taking differently: Place 2 sprays into both nostrils at bedtime.) 16 g 5  . furosemide (LASIX) 40 MG tablet Take 1 tablet (40 mg total) by mouth daily as needed for fluid. Hold until follow up with PCP    . ipratropium-albuterol (DUONEB) 0.5-2.5 (3) MG/3ML SOLN Inhale 3 mLs into the lungs every 6 (six) hours as needed (shortness of breath).     . LORazepam (ATIVAN) 0.5 MG  tablet Take 0.5 mg by mouth 2 (two) times daily.     Marland Kitchen losartan (COZAAR) 50 MG tablet Take 50 mg by mouth daily.    . magnesium 30 MG tablet Take 30 mg by mouth at bedtime.     . Multiple Vitamin (ONE-A-DAY 55 PLUS PO) Take 1 tablet by mouth daily.    . nebivolol (BYSTOLIC) 5 MG tablet Take 1 tablet (5 mg total) by mouth daily. 90 tablet 1  . nystatin (MYCOSTATIN/NYSTOP) powder Apply 1 application topically 3 (three) times daily. Apply to groin region until redness and irritation improving 15 g 1  . Omega-3 Fatty Acids (FISH OIL BURP-LESS) 1000 MG CAPS Take 1 capsule by mouth daily. (Patient not taking: No sig reported)    . ondansetron (ZOFRAN) 8 MG tablet Take 8 mg by mouth daily as needed for nausea or vomiting.  (Patient not taking: No sig reported)    . pantoprazole (PROTONIX) 40 MG tablet Take 1 tablet (40 mg total) by mouth daily before breakfast. 30 tablet 5  . polyvinyl alcohol (LIQUIFILM TEARS) 1.4 % ophthalmic solution Place 1 drop into both eyes daily.    . psyllium (HYDROCIL/METAMUCIL) 95 % PACK Take 1 packet by mouth daily. 240 each 2  . thiamine (VITAMIN B-1) 100 MG tablet Take 100 mg by mouth daily.    Marland Kitchen XTAMPZA ER 13.5 MG C12A Take 1 capsule by mouth 2 (two) times daily. (Patient not taking: Reported on 12/23/2020)     No current facility-administered medications for this visit.     Past Surgical History:  Procedure Laterality Date  . ABDOMINAL HYSTERECTOMY  1985  . BACK SURGERY    . BIOPSY  07/15/2020   Procedure: BIOPSY;  Surgeon: Daneil Dolin, MD;  [facility];  Service: Endoscopy;;  . CATARACT EXTRACTION, BILATERAL    . CHOLECYSTECTOMY    . COLONOSCOPY  08/10/2002   NUR: Normal colonoscopy except for some hemorrhoids and some erythema at the dentate line  . COLONOSCOPY  12/10/2006   Edwards:Diffuse colitis in the transverse and descending colon/This is not entirely typical of ischemic colitis or her HIV positive/ disease.  We need to be concerned about  other causes  . COLONOSCOPY  01/19/2011   RMR: Internal and external hemorrhoids, likely source of hematochezia anal papilla, otherwise normal rectal mucosa/ Left-sided diverticula.  Cecal polyp with status post cold snare polypectomy.  Remainder of colonic mucosa appeared normal. Pathology with tubular adenoma. Repeat in 2017  . COLONOSCOPY WITH PROPOFOL N/A 07/15/2020   Procedure: COLONOSCOPY WITH PROPOFOL;  Surgeon: Daneil Dolin, MD;  [facility]: [facility];  Service: Endoscopy;  Laterality: N/A;  8:15AM TCS via Ostomy  . COLOSTOMY N/A 11/12/2018   Procedure: COLOSTOMY;  Surgeon: Virl Cagey, MD;  Location: [facility];  Service: General;  Laterality: N/A;  .  COLOSTOMY REVERSAL N/A 10/02/2020   Procedure: COLOSTOMY REVERSAL;  Surgeon: Virl Cagey, MD;  Location: [facility];  Service: General;  Laterality: N/A;  . ESOPHAGOGASTRODUODENOSCOPY  08/10/2002     NUR: Mild changes of reflux esophagitis limited to gastroesophageal junction  No evidence of ring, stricture, or esophageal candidiasis dysphagia/ Gastritis, possibly H. pylori induced  . ESOPHAGOGASTRODUODENOSCOPY  12/25/2002   NUR: Erosive antral gastritis.  The degree of gastritis is more significant than her last exam/ Normal examination of the esophagus/ Esophageal dilatation performed by passing 66 and [address] [place]  . ESOPHAGOGASTRODUODENOSCOPY  11/13/2010   RMR: Circumferential distal esophageal erosions with soft peptic stricture, consistent with erosive reflux esophagitis or stricture  formation, status post dilation as described above/  Small hiatal hernia/ Tiny antral erosions, otherwise normal stomach D1 and D2  . ESOPHAGOGASTRODUODENOSCOPY (EGD) WITH PROPOFOL N/A 07/15/2020   Procedure: ESOPHAGOGASTRODUODENOSCOPY (EGD) WITH PROPOFOL;  Surgeon: Daneil Dolin, MD;  [facility];  Service: Endoscopy;  Laterality: N/A;  . FLEXIBLE SIGMOIDOSCOPY N/A 07/15/2020   Procedure: FLEXIBLE SIGMOIDOSCOPY;  Surgeon: Daneil Dolin, MD;  [facility];  Service: Endoscopy;  Laterality: N/A;  . HEMORRHOID SURGERY  09/02/2012   Procedure: HEMORRHOIDECTOMY;  Surgeon: Jamesetta So, MD;  Location: [facility];  Service: General;  Laterality: N/A;  . LAPAROTOMY N/A 11/12/2018   Procedure: EXPLORATORY LAPAROTOMY;  Surgeon: Virl Cagey, MD;  Location: [facility];  Service: General;  Laterality: N/A;  Venia Minks DILATION N/A 07/15/2020   Procedure: Keturah Shavers;  Surgeon: Daneil Dolin, MD;  [facility];  Service: Endoscopy;  Laterality: N/A;  . PARTIAL COLECTOMY N/A 11/12/2018   Procedure: PARTIAL COLECTOMY;  Surgeon: Virl Cagey, MD;  Location: [facility];  Service: General;  Laterality: N/A;  . POLYPECTOMY  07/15/2020   Procedure: POLYPECTOMY;  Surgeon: Daneil Dolin, MD;  [facility];  Service: Endoscopy;;  . TUBAL LIGATION       Allergies  Allergen Reactions  . Bee Venom Anaphylaxis  . Promethazine Swelling  . Tenofovir Disoproxil Fumarate [Tenofovir Disoproxil Fumarate] Other (See Comments)    Renal failure, (08/27/2006)  . Compazine  [Prochlorperazine Edisylate] Nausea And Vomiting  . Haloperidol Lactate Other (See Comments)    Reaction is muscle tension, causes severe spasms in face and neck. Forces eyes to roll in the back of the head  . Sulfa Antibiotics Diarrhea  . Brompheniramine-Acetaminophen   . Chlorcyclizine   . Chlorpromazine   . Codeine Itching and Nausea And Vomiting  . Navane [Thiothixene] Other (See Comments) and Nausea And Vomiting    Same muscle spasm reaction as haldol  . Prednisone     Brief mild psychosis and agitation  . Promethazine Hcl Other (See Comments)  . Propoxyphene N-Acetaminophen Itching and Nausea And Vomiting  . Morphine Itching and Swelling      Family History  Problem Relation Age of Onset  . Diabetes Mother   . Diabetes Brother   . Colon cancer Brother        Diagnosed > age 80     Social History Ms. Monfils reports  that she has never smoked. She has never used smokeless tobacco. Ms. Loomer reports no history of alcohol use.   Review of Systems CONSTITUTIONAL: No weight loss, fever, chills, weakness or fatigue.  HEENT: Eyes: No visual loss, blurred vision, double vision or yellow sclerae.No hearing loss, sneezing, congestion, runny nose or sore throat.  SKIN: No rash or  itching.  CARDIOVASCULAR:  RESPIRATORY: No shortness of breath, cough or sputum.  GASTROINTESTINAL: No anorexia, nausea, vomiting or diarrhea. No abdominal pain or blood.  GENITOURINARY: No burning on urination, no polyuria NEUROLOGICAL: No headache, dizziness, syncope, paralysis, ataxia, numbness or tingling in the extremities. No change in bowel or bladder control.  MUSCULOSKELETAL: No muscle, back pain, joint pain or stiffness.  LYMPHATICS: No enlarged nodes. No history of splenectomy.  PSYCHIATRIC: No history of depression or anxiety.  ENDOCRINOLOGIC: No reports of sweating, cold or heat intolerance. No polyuria or polydipsia.  Marland Kitchen   Physical Examination There were no vitals filed for this visit. There were no vitals filed for this visit.  Gen: resting comfortably, no acute distress HEENT: no scleral icterus, pupils equal round and reactive, no palptable cervical adenopathy,  CV Resp: Clear to auscultation bilaterally GI: abdomen is soft, non-tender, non-distended, normal bowel sounds, no hepatosplenomegaly MSK: extremities are warm, no edema.  Skin: warm, no rash Neuro:  no focal deficits Psych: appropriate affect   Diagnostic Studies  Jan 2020 echo Study Conclusions  - Left ventricle: The cavity size was normal. Wall thickness was increased in a pattern of moderate LVH. Systolic function was normal. The estimated ejection fraction was in the range of 60% to 65%. Wall motion was normal; there were no regional wall motion abnormalities. Doppler parameters are consistent with abnormal left ventricular  relaxation (grade 1 diastolic dysfunction). - Aortic valve: Mildly calcified annulus. Trileaflet; mildly thickened leaflets. Valve area (VTI): 2.67 cm^2. Valve area (Vmax): 2.36 cm^2. - Mitral valve: Mildly calcified annulus. Mildly thickened leaflets   Assessment and Plan  1. Postop Afib - isolated episode postop short in duration, no recurrence. She was very symptomatic when occurred in the hospital, no similar symptoms since. - continue to monitor. Lower her bystolic to 5mg  daily due to low HRs  2. HTN -above goal but has not taken meds yet today   3. Preoperative evaluation - recommend proceeding with abdominal surgery as planned, no additional cardiac workup indicated - tolerates greater than 4METs without symptoms.       Arnoldo Lenis, M.D., F.A.C.C.

## 2021-03-04 ENCOUNTER — Encounter: Payer: Self-pay | Admitting: Cardiology

## 2021-03-06 ENCOUNTER — Ambulatory Visit: Payer: Medicare Other | Admitting: Infectious Disease

## 2021-03-12 ENCOUNTER — Encounter: Payer: Self-pay | Admitting: Infectious Disease

## 2021-03-12 ENCOUNTER — Other Ambulatory Visit: Payer: Self-pay

## 2021-03-12 ENCOUNTER — Ambulatory Visit (INDEPENDENT_AMBULATORY_CARE_PROVIDER_SITE_OTHER): Payer: Medicare Other | Admitting: Infectious Disease

## 2021-03-12 VITALS — BP 110/70 | HR 62 | Temp 98.7°F

## 2021-03-12 DIAGNOSIS — N179 Acute kidney failure, unspecified: Secondary | ICD-10-CM

## 2021-03-12 DIAGNOSIS — N1832 Chronic kidney disease, stage 3b: Secondary | ICD-10-CM | POA: Diagnosis not present

## 2021-03-12 DIAGNOSIS — J449 Chronic obstructive pulmonary disease, unspecified: Secondary | ICD-10-CM | POA: Diagnosis not present

## 2021-03-12 DIAGNOSIS — E669 Obesity, unspecified: Secondary | ICD-10-CM

## 2021-03-12 DIAGNOSIS — F321 Major depressive disorder, single episode, moderate: Secondary | ICD-10-CM

## 2021-03-12 DIAGNOSIS — Z599 Problem related to housing and economic circumstances, unspecified: Secondary | ICD-10-CM

## 2021-03-12 DIAGNOSIS — B20 Human immunodeficiency virus [HIV] disease: Secondary | ICD-10-CM | POA: Diagnosis not present

## 2021-03-12 DIAGNOSIS — G909 Disorder of the autonomic nervous system, unspecified: Secondary | ICD-10-CM

## 2021-03-12 DIAGNOSIS — Z21 Asymptomatic human immunodeficiency virus [HIV] infection status: Secondary | ICD-10-CM

## 2021-03-12 HISTORY — DX: Major depressive disorder, single episode, moderate: F32.1

## 2021-03-12 MED ORDER — TRIUMEQ 600-50-300 MG PO TABS: 1.0000 | ORAL_TABLET | Freq: Every day | ORAL | 11 refills | Status: DC

## 2021-03-12 NOTE — Progress Notes (Signed)
Subjective:   Chief complaints: She is complaining of sinus headaches as she did last year when I saw her also the end of the visit she complained of dysuria and itching  :  Patient ID: [name], adult    DOB: 1940/12/29, 80 y.o.   MRN: [id number]  HPI 80 year-old Caucasian female with HIV currently well controlled on TRIUMEQ  Her viral load was undetectable and cd4 count healthy.  [name] unfortunately has been suffering from severe depression at one point in time with auditory hallucinations that she had not had in the past she was hospitalized for this and has been spending time inpatient behavioral health.  She more recently moved in with her daughter who is helping take care of her she is barely able to move and is largely wheelchair-bound at present.  This makes her ability and wished to move into independent housing not possible.        Past Medical History:  Diagnosis Date  . Anxiety   . Arthritis   . Bronchitis   . Bronchitis   . CKD (chronic kidney disease) stage 3, GFR 30-59 ml/min (HCC) 05/04/2016  . Complication of anesthesia    pt states she had a cardiac arrest during hysterectomy 1985.  Pt had several surgies since then that were uneventful  . COPD (chronic obstructive pulmonary disease) ([place])   . Depression   . Dysuria 03/27/2019  . Elevated LFTs 05/04/2016  . History of TIAs 1981   left side weakness  . HIV (human immunodeficiency virus infection) ([place])   . Hypertension   . Kidney disease related to HIV infection [facility])    pt states she has to have her creatinine level checked often   . Nausea 03/03/2017  . Peripheral neuropathy    bilateral feet  . Seasonal allergies 03/27/2019  . Sinusitis 05/06/2018  . UTI (urinary tract infection) 03/03/2017  . Weight loss 03/03/2017    Past Surgical History:  Procedure Laterality Date  . ABDOMINAL HYSTERECTOMY  1985  . BACK SURGERY    . BIOPSY  07/15/2020   Procedure: BIOPSY;  Surgeon: Daneil Dolin, MD;  [facility]  [facility];  Service: Endoscopy;;  . CATARACT EXTRACTION, BILATERAL    . CHOLECYSTECTOMY    . COLONOSCOPY  08/10/2002   NUR: Normal colonoscopy except for some hemorrhoids and some erythema at the dentate line  . COLONOSCOPY  12/10/2006   Edwards:Diffuse colitis in the transverse and descending colon/This is not entirely typical of ischemic colitis or her HIV positive/ disease.  We need to be concerned about other causes  . COLONOSCOPY  01/19/2011   RMR: Internal and external hemorrhoids, likely source of hematochezia anal papilla, otherwise normal rectal mucosa/ Left-sided diverticula.  Cecal polyp with status post cold snare polypectomy.  Remainder of colonic mucosa appeared normal. Pathology with tubular adenoma. Repeat in 2017  . COLONOSCOPY WITH PROPOFOL N/A 07/15/2020   Procedure: COLONOSCOPY WITH PROPOFOL;  Surgeon: Daneil Dolin, MD;  Location: [facility];  Service: Endoscopy;  Laterality: N/A;  8:15AM TCS via Ostomy  . COLOSTOMY N/A 11/12/2018   Procedure: COLOSTOMY;  Surgeon: Virl Cagey, MD;  Location: [facility] OR[facility];  Service: General;  Laterality: N/A;  . COLOSTOMY REVERSAL N/A 10/02/2020   Procedure: COLOSTOMY REVERSAL;  Surgeon: Virl Cagey, MD;  Location: [facility];  Service: General;  Laterality: N/A;  . ESOPHAGOGASTRODUODENOSCOPY  08/10/2002     NUR: Mild changes of reflux esophagitis limited to gastroesophageal junction  No evidence  of ring, stricture, or esophageal candidiasis dysphagia/ Gastritis, possibly H. pylori induced  . ESOPHAGOGASTRODUODENOSCOPY  12/25/2002   NUR: Erosive antral gastritis.  The degree of gastritis is more significant than her last exam/ Normal examination of the esophagus/ Esophageal dilatation performed by passing 66 and 20 [place]  . ESOPHAGOGASTRODUODENOSCOPY  11/13/2010   RMR: Circumferential distal esophageal erosions with soft peptic stricture, consistent with erosive reflux esophagitis or stricture  formation, status post  dilation as described above/  Small hiatal hernia/ Tiny antral erosions, otherwise normal stomach D1 and D2  . ESOPHAGOGASTRODUODENOSCOPY (EGD) WITH PROPOFOL N/A 07/15/2020   Procedure: ESOPHAGOGASTRODUODENOSCOPY (EGD) WITH PROPOFOL;  Surgeon: Daneil Dolin, MD;  [facility];  Service: Endoscopy;  Laterality: N/A;  . FLEXIBLE SIGMOIDOSCOPY N/A 07/15/2020   Procedure: FLEXIBLE SIGMOIDOSCOPY;  Surgeon: Daneil Dolin, MD;  [facility];  Service: Endoscopy;  Laterality: N/A;  . HEMORRHOID SURGERY  09/02/2012   Procedure: HEMORRHOIDECTOMY;  Surgeon: Jamesetta So, MD;  Location: [facility];  Service: General;  Laterality: N/A;  . LAPAROTOMY N/A 11/12/2018   Procedure: EXPLORATORY LAPAROTOMY;  Surgeon: Virl Cagey, MD;  Location: [facility];  Service: General;  Laterality: N/A;  Venia Minks DILATION N/A 07/15/2020   Procedure: Keturah Shavers;  Surgeon: Daneil Dolin, MD;  [facility];  Service: Endoscopy;  Laterality: N/A;  . PARTIAL COLECTOMY N/A 11/12/2018   Procedure: PARTIAL COLECTOMY;  Surgeon: Virl Cagey, MD;  Location: [facility];  Service: General;  Laterality: N/A;  . POLYPECTOMY  07/15/2020   Procedure: POLYPECTOMY;  Surgeon: Daneil Dolin, MD;  [facility];  Service: Endoscopy;;  . TUBAL LIGATION      Family History  Problem Relation Age of Onset  . Diabetes Mother   . Diabetes Brother   . Colon cancer Brother        Diagnosed > age 41      Social History   Socioeconomic History  . Marital status: Widowed    Spouse name: Not on file  . Number of children: 3  . Years of education: Not on file  . Highest education level: Not on file  Occupational History  . Occupation: retired    Fish farm manager: RETIRED  Tobacco Use  . Smoking status: Never Smoker  . Smokeless tobacco: Never Used  Vaping Use  . Vaping Use: Never used  Substance and Sexual Activity  . Alcohol use: No  . Drug use: No  . Sexual activity: Not Currently     Comment: declined condoms  Other Topics Concern  . Not on file  Social History Narrative  . Not on file   Social Determinants of Health   Financial Resource Strain: Not on file  Food Insecurity: Not on file  Transportation Needs: Not on file  Physical Activity: Not on file  Stress: Not on file  Social Connections: Not on file    Allergies  Allergen Reactions  . Bee Venom Anaphylaxis  . Promethazine Swelling  . Tenofovir Disoproxil Fumarate [Tenofovir Disoproxil Fumarate] Other (See Comments)    Renal failure, (08/27/2006)  . Compazine  [Prochlorperazine Edisylate] Nausea And Vomiting  . Haloperidol Lactate Other (See Comments)    Reaction is muscle tension, causes severe spasms in face and neck. Forces eyes to roll in the back of the head  . Sulfa Antibiotics Diarrhea  . Brompheniramine-Acetaminophen   . Chlorcyclizine   . Chlorpromazine   . Codeine Itching and Nausea And Vomiting  . Navane [  Thiothixene] Other (See Comments) and Nausea And Vomiting    Same muscle spasm reaction as haldol  . Prednisone     Brief mild psychosis and agitation  . Promethazine Hcl Other (See Comments)  . Propoxyphene N-Acetaminophen Itching and Nausea And Vomiting  . Morphine Itching and Swelling     Current Outpatient Medications:  .  abacavir-dolutegravir-lamiVUDine (TRIUMEQ) 600-50-300 MG tablet, Take 1 tablet by mouth daily., Disp: 30 tablet, Rfl: 0 .  acetaminophen (TYLENOL) 500 MG tablet, Take 2 tablets (1,000 mg total) by mouth every 6 (six) hours as needed for mild pain or moderate pain., Disp: 30 tablet, Rfl: 0 .  albuterol (PROVENTIL HFA;VENTOLIN HFA) 108 (90 Base) MCG/ACT inhaler, Inhale 1-2 puffs into the lungs every 6 (six) hours as needed for wheezing or shortness of breath., Disp: 1 Inhaler, Rfl: 0 .  diphenhydrAMINE (BENADRYL) 25 mg capsule, Take 25 mg by mouth daily as needed for itching (itching from codeine). , Disp: , Rfl:  .  docusate sodium (COLACE) 100 MG capsule, Take 1  capsule (100 mg total) by mouth 2 (two) times daily., Disp: 10 capsule, Rfl: 0 .  DULoxetine (CYMBALTA) 20 MG capsule, Take 20 mg by mouth at bedtime., Disp: , Rfl:  .  fluticasone (FLONASE) 50 MCG/ACT nasal spray, 2 SPRAYS INTO BOTH NOSTRILS AT BEDTIME. (Patient taking differently: Place 2 sprays into both nostrils at bedtime.), Disp: 16 g, Rfl: 5 .  furosemide (LASIX) 40 MG tablet, Take 1 tablet (40 mg total) by mouth daily as needed for fluid. Hold until follow up with PCP, Disp: , Rfl:  .  ipratropium-albuterol (DUONEB) 0.5-2.5 (3) MG/3ML SOLN, Inhale 3 mLs into the lungs every 6 (six) hours as needed (shortness of breath). , Disp: , Rfl:  .  LORazepam (ATIVAN) 0.5 MG tablet, Take 0.5 mg by mouth 2 (two) times daily. , Disp: , Rfl:  .  losartan (COZAAR) 50 MG tablet, Take 50 mg by mouth daily., Disp: , Rfl:  .  magnesium 30 MG tablet, Take 30 mg by mouth at bedtime. , Disp: , Rfl:  .  Multiple Vitamin (ONE-A-DAY 55 PLUS PO), Take 1 tablet by mouth daily., Disp: , Rfl:  .  nebivolol (BYSTOLIC) 5 MG tablet, Take 1 tablet (5 mg total) by mouth daily., Disp: 90 tablet, Rfl: 1 .  nystatin (MYCOSTATIN/NYSTOP) powder, Apply 1 application topically 3 (three) times daily. Apply to groin region until redness and irritation improving, Disp: 15 g, Rfl: 1 .  pantoprazole (PROTONIX) 40 MG tablet, Take 1 tablet (40 mg total) by mouth daily before breakfast., Disp: 30 tablet, Rfl: 5 .  polyvinyl alcohol (LIQUIFILM TEARS) 1.4 % ophthalmic solution, Place 1 drop into both eyes daily., Disp: , Rfl:  .  psyllium (HYDROCIL/METAMUCIL) 95 % PACK, Take 1 packet by mouth daily., Disp: 240 each, Rfl: 2 .  thiamine (VITAMIN B-1) 100 MG tablet, Take 100 mg by mouth daily., Disp: , Rfl:  .  Cholecalciferol (VITAMIN D3) 10000 units TABS, Take 1 tablet by mouth daily. (Patient not taking: No sig reported), Disp: , Rfl:  .  Omega-3 Fatty Acids (FISH OIL BURP-LESS) 1000 MG CAPS, Take 1 capsule by mouth daily. (Patient not  taking: No sig reported), Disp: , Rfl:  .  ondansetron (ZOFRAN) 8 MG tablet, Take 8 mg by mouth daily as needed for nausea or vomiting.  (Patient not taking: No sig reported), Disp: , Rfl:  .  XTAMPZA ER 13.5 MG C12A, Take 1 capsule by mouth 2 (two) times  daily. (Patient not taking: No sig reported), Disp: , Rfl:   SReview of Systems  Constitutional: Negative for appetite change and diaphoresis.  HENT: Positive for sinus pain. Negative for congestion, postnasal drip, rhinorrhea, sinus pressure, sneezing and trouble swallowing.   Respiratory: Negative for chest tightness and stridor.   Cardiovascular: Negative for palpitations and leg swelling.  Gastrointestinal: Negative for abdominal distention, anal bleeding, blood in stool, constipation, diarrhea and nausea.  Genitourinary: Negative for difficulty urinating, dysuria, flank pain, hematuria and urgency.  Musculoskeletal: Negative for gait problem and joint swelling.  Skin: Negative for color change, pallor and wound.  Neurological: Negative for tremors and light-headedness.  Hematological: Negative for adenopathy. Does not bruise/bleed easily.  Psychiatric/Behavioral: Positive for decreased concentration, dysphoric mood and hallucinations. Negative for agitation, behavioral problems, confusion, self-injury and sleep disturbance. The patient is nervous/anxious.        Objective:   Physical Exam Constitutional:      General: She is not in acute distress.    Appearance: She is well-developed. She is not diaphoretic.  HENT:     Head: Normocephalic and atraumatic.     Nose: Mucosal edema present.     Mouth/Throat:     Pharynx: No oropharyngeal exudate.  Eyes:     General: No scleral icterus.    Conjunctiva/sclera: Conjunctivae normal.  Cardiovascular:     Rate and Rhythm: Normal rate and regular rhythm.  Pulmonary:     Effort: Pulmonary effort is normal. No respiratory distress.     Breath sounds: No wheezing.  Abdominal:      General: There is no distension.  Musculoskeletal:        General: No tenderness.     Cervical back: Normal range of motion and neck supple.  Skin:    General: Skin is warm and dry.     Coloration: Skin is not pale.     Findings: No erythema or rash.  Neurological:     Mental Status: She is alert and oriented to person, place, and time.     Motor: No abnormal muscle tone.     Coordination: Coordination normal.  Psychiatric:        Behavior: Behavior normal.        Thought Content: Thought content normal.        Judgment: Judgment normal.           Assessment & Plan:    HIV: Checking labs today and will continue Triumeq for now  Severe  depression with recent problems with auditory hallucinations: Engaged in care I will have her come back and check in with [place] in a few months   CKD: relatively stable  I spent greater than 40 minutes with the patient including greater than 50% of time in face to face counsel of the patient review of pertinent radiographic data review of records laboratory and microbiological data and in coordination of her care.

## 2021-03-13 LAB — T-HELPER CELL (CD4) - (RCID CLINIC ONLY)
CD4 % Helper T Cell: 25 % — ABNORMAL LOW (ref 33–65)
CD4 T Cell Abs: 637 /uL (ref 400–1790)

## 2021-03-16 LAB — CBC WITH DIFFERENTIAL/PLATELET
Absolute Monocytes: 765 cells/uL (ref 200–950)
Basophils Absolute: 30 cells/uL (ref 0–200)
Basophils Relative: 0.4 %
Eosinophils Absolute: 120 cells/uL (ref 15–500)
Eosinophils Relative: 1.6 %
HCT: 39.9 % (ref 35.0–45.0)
Hemoglobin: 13.8 g/dL (ref 11.7–15.5)
Lymphs Abs: 2528 cells/uL (ref 850–3900)
MCH: 34.3 pg — ABNORMAL HIGH (ref 27.0–33.0)
MCHC: 34.6 g/dL (ref 32.0–36.0)
MCV: 99.3 fL (ref 80.0–100.0)
MPV: 9.7 fL (ref 7.5–12.5)
Monocytes Relative: 10.2 %
Neutro Abs: 4058 cells/uL (ref 1500–7800)
Neutrophils Relative %: 54.1 %
Platelets: 152 10*3/uL (ref 140–400)
RBC: 4.02 10*6/uL (ref 3.80–5.10)
RDW: 14.3 % (ref 11.0–15.0)
Total Lymphocyte: 33.7 %
WBC: 7.5 10*3/uL (ref 3.8–10.8)

## 2021-03-16 LAB — COMPLETE METABOLIC PANEL WITH GFR
AG Ratio: 1.8 (calc) (ref 1.0–2.5)
ALT: 16 U/L (ref 6–29)
AST: 18 U/L (ref 10–35)
Albumin: 4.3 g/dL (ref 3.6–5.1)
Alkaline phosphatase (APISO): 103 U/L (ref 37–153)
BUN/Creatinine Ratio: 25 (calc) — ABNORMAL HIGH (ref 6–22)
BUN: 31 mg/dL — ABNORMAL HIGH (ref 7–25)
CO2: 26 mmol/L (ref 20–32)
Calcium: 10.7 mg/dL — ABNORMAL HIGH (ref 8.6–10.4)
Chloride: 103 mmol/L (ref 98–110)
Creat: 1.26 mg/dL — ABNORMAL HIGH (ref 0.60–0.88)
GFR, Est African American: 47 mL/min/{1.73_m2} — ABNORMAL LOW (ref >=60)
GFR, Est Non African American: 40 mL/min/{1.73_m2} — ABNORMAL LOW (ref >=60)
Globulin: 2.4 g/dL (calc) (ref 1.9–3.7)
Glucose, Bld: 93 mg/dL (ref 65–99)
Potassium: 4.9 mmol/L (ref 3.5–5.3)
Sodium: 138 mmol/L (ref 135–146)
Total Bilirubin: 0.7 mg/dL (ref 0.2–1.2)
Total Protein: 6.7 g/dL (ref 6.1–8.1)

## 2021-03-16 LAB — RPR: RPR Ser Ql: NONREACTIVE

## 2021-03-16 LAB — LIPID PANEL
Cholesterol: 233 mg/dL — ABNORMAL HIGH (ref <200)
HDL: 35 mg/dL — ABNORMAL LOW (ref >=50)
LDL Cholesterol (Calc): 146 mg/dL (calc) — ABNORMAL HIGH
Non-HDL Cholesterol (Calc): 198 mg/dL (calc) — ABNORMAL HIGH (ref <130)
Total CHOL/HDL Ratio: 6.7 (calc) — ABNORMAL HIGH (ref <5.0)
Triglycerides: 336 mg/dL — ABNORMAL HIGH (ref <150)

## 2021-03-16 LAB — HIV-1 RNA QUANT-NO REFLEX-BLD
HIV 1 RNA Quant: NOT DETECTED Copies/mL
HIV-1 RNA Quant, Log: NOT DETECTED Log cps/mL

## 2021-03-25 ENCOUNTER — Other Ambulatory Visit: Payer: Self-pay | Admitting: Family

## 2021-03-25 DIAGNOSIS — Z21 Asymptomatic human immunodeficiency virus [HIV] infection status: Secondary | ICD-10-CM

## 2021-05-09 ENCOUNTER — Inpatient Hospital Stay (HOSPITAL_COMMUNITY)
Admission: EM | Admit: 2021-05-09 | Discharge: 2021-05-17 | DRG: 975 | Disposition: A | Discharge status: Skilled Nursing Facility | Payer: Medicare Other | Attending: Family Medicine | Admitting: Family Medicine

## 2021-05-09 ENCOUNTER — Emergency Department (HOSPITAL_COMMUNITY): Payer: Medicare Other

## 2021-05-09 ENCOUNTER — Encounter (HOSPITAL_COMMUNITY): Payer: Self-pay | Admitting: *Deleted

## 2021-05-09 DIAGNOSIS — B20 Human immunodeficiency virus [HIV] disease: Secondary | ICD-10-CM

## 2021-05-09 DIAGNOSIS — R718 Other abnormality of red blood cells: Secondary | ICD-10-CM

## 2021-05-09 DIAGNOSIS — N1831 Chronic kidney disease, stage 3a: Secondary | ICD-10-CM | POA: Diagnosis present

## 2021-05-09 DIAGNOSIS — Z7401 Bed confinement status: Secondary | ICD-10-CM

## 2021-05-09 DIAGNOSIS — R262 Difficulty in walking, not elsewhere classified: Secondary | ICD-10-CM | POA: Diagnosis present

## 2021-05-09 DIAGNOSIS — Z20822 Contact with and (suspected) exposure to covid-19: Secondary | ICD-10-CM | POA: Diagnosis present

## 2021-05-09 DIAGNOSIS — J189 Pneumonia, unspecified organism: Principal | ICD-10-CM

## 2021-05-09 DIAGNOSIS — R531 Weakness: Secondary | ICD-10-CM | POA: Diagnosis not present

## 2021-05-09 DIAGNOSIS — R1314 Dysphagia, pharyngoesophageal phase: Secondary | ICD-10-CM | POA: Diagnosis present

## 2021-05-09 DIAGNOSIS — R54 Age-related physical debility: Secondary | ICD-10-CM | POA: Diagnosis present

## 2021-05-09 DIAGNOSIS — Z882 Allergy status to sulfonamides status: Secondary | ICD-10-CM

## 2021-05-09 DIAGNOSIS — Z885 Allergy status to narcotic agent status: Secondary | ICD-10-CM | POA: Diagnosis not present

## 2021-05-09 DIAGNOSIS — F419 Anxiety disorder, unspecified: Secondary | ICD-10-CM | POA: Diagnosis present

## 2021-05-09 DIAGNOSIS — Z21 Asymptomatic human immunodeficiency virus [HIV] infection status: Secondary | ICD-10-CM | POA: Diagnosis present

## 2021-05-09 DIAGNOSIS — I129 Hypertensive chronic kidney disease with stage 1 through stage 4 chronic kidney disease, or unspecified chronic kidney disease: Secondary | ICD-10-CM | POA: Diagnosis present

## 2021-05-09 DIAGNOSIS — Z800 Family history of malignant neoplasm of digestive organs: Secondary | ICD-10-CM | POA: Diagnosis not present

## 2021-05-09 DIAGNOSIS — I10 Essential (primary) hypertension: Secondary | ICD-10-CM | POA: Diagnosis not present

## 2021-05-09 DIAGNOSIS — N1832 Chronic kidney disease, stage 3b: Secondary | ICD-10-CM

## 2021-05-09 DIAGNOSIS — R001 Bradycardia, unspecified: Secondary | ICD-10-CM | POA: Diagnosis present

## 2021-05-09 DIAGNOSIS — M1990 Unspecified osteoarthritis, unspecified site: Secondary | ICD-10-CM | POA: Diagnosis present

## 2021-05-09 DIAGNOSIS — J449 Chronic obstructive pulmonary disease, unspecified: Secondary | ICD-10-CM | POA: Diagnosis not present

## 2021-05-09 DIAGNOSIS — Z79899 Other long term (current) drug therapy: Secondary | ICD-10-CM | POA: Diagnosis not present

## 2021-05-09 DIAGNOSIS — Z888 Allergy status to other drugs, medicaments and biological substances status: Secondary | ICD-10-CM | POA: Diagnosis not present

## 2021-05-09 DIAGNOSIS — G629 Polyneuropathy, unspecified: Secondary | ICD-10-CM | POA: Diagnosis present

## 2021-05-09 DIAGNOSIS — Z91030 Bee allergy status: Secondary | ICD-10-CM | POA: Diagnosis not present

## 2021-05-09 DIAGNOSIS — Z833 Family history of diabetes mellitus: Secondary | ICD-10-CM

## 2021-05-09 DIAGNOSIS — R1319 Other dysphagia: Secondary | ICD-10-CM

## 2021-05-09 DIAGNOSIS — N183 Chronic kidney disease, stage 3 unspecified: Secondary | ICD-10-CM | POA: Diagnosis present

## 2021-05-09 DIAGNOSIS — J440 Chronic obstructive pulmonary disease with acute lower respiratory infection: Secondary | ICD-10-CM | POA: Diagnosis present

## 2021-05-09 DIAGNOSIS — K219 Gastro-esophageal reflux disease without esophagitis: Secondary | ICD-10-CM | POA: Diagnosis present

## 2021-05-09 DIAGNOSIS — F209 Schizophrenia, unspecified: Secondary | ICD-10-CM | POA: Diagnosis present

## 2021-05-09 LAB — COMPREHENSIVE METABOLIC PANEL
ALT: 19 U/L (ref 0–44)
AST: 19 U/L (ref 15–41)
Albumin: 4.5 g/dL (ref 3.5–5.0)
Alkaline Phosphatase: 97 U/L (ref 38–126)
Anion gap: 6 (ref 5–15)
BUN: 27 mg/dL — ABNORMAL HIGH (ref 8–23)
CO2: 27 mmol/L (ref 22–32)
Calcium: 10.4 mg/dL — ABNORMAL HIGH (ref 8.9–10.3)
Chloride: 104 mmol/L (ref 98–111)
Creatinine, Ser: 1.28 mg/dL — ABNORMAL HIGH (ref 0.44–1.00)
GFR, Estimated: 42 mL/min — ABNORMAL LOW (ref >60)
Glucose, Bld: 101 mg/dL — ABNORMAL HIGH (ref 70–99)
Potassium: 4.5 mmol/L (ref 3.5–5.1)
Sodium: 137 mmol/L (ref 135–145)
Total Bilirubin: 0.6 mg/dL (ref 0.3–1.2)
Total Protein: 7.5 g/dL (ref 6.5–8.1)

## 2021-05-09 LAB — CBC WITH DIFFERENTIAL/PLATELET
Abs Immature Granulocytes: 0.01 10*3/uL (ref 0.00–0.07)
Basophils Absolute: 0 10*3/uL (ref 0.0–0.1)
Basophils Relative: 0 %
Eosinophils Absolute: 0.2 10*3/uL (ref 0.0–0.5)
Eosinophils Relative: 2 %
HCT: 42.8 % (ref 36.0–46.0)
Hemoglobin: 14.9 g/dL (ref 12.0–15.0)
Immature Granulocytes: 0 %
Lymphocytes Relative: 37 %
Lymphs Abs: 2.7 10*3/uL (ref 0.7–4.0)
MCH: 36.6 pg — ABNORMAL HIGH (ref 26.0–34.0)
MCHC: 34.8 g/dL (ref 30.0–36.0)
MCV: 105.2 fL — ABNORMAL HIGH (ref 80.0–100.0)
Monocytes Absolute: 0.7 10*3/uL (ref 0.1–1.0)
Monocytes Relative: 9 %
Neutro Abs: 3.7 10*3/uL (ref 1.7–7.7)
Neutrophils Relative %: 52 %
Platelets: 237 10*3/uL (ref 150–400)
RBC: 4.07 MIL/uL (ref 3.87–5.11)
RDW: 13.6 % (ref 11.5–15.5)
WBC: 7.2 10*3/uL (ref 4.0–10.5)
nRBC: 0 % (ref 0.0–0.2)

## 2021-05-09 LAB — RESP PANEL BY RT-PCR (FLU A&B, COVID) ARPGX2
Influenza A by PCR: NEGATIVE
Influenza B by PCR: NEGATIVE
SARS Coronavirus 2 by RT PCR: NEGATIVE

## 2021-05-09 MED ORDER — METRONIDAZOLE 500 MG/100ML IV SOLN
500.0000 mg | Freq: Three times a day (TID) | INTRAVENOUS | Status: DC
  Administered 2021-05-09 – 2021-05-14 (×16): 500 mg via INTRAVENOUS
  Filled 2021-05-09 (×17): qty 100

## 2021-05-09 MED ORDER — IOHEXOL 350 MG/ML SOLN: 75.0000 mL | Freq: Once | INTRAVENOUS | Status: DC | PRN

## 2021-05-09 MED ORDER — HEPARIN SODIUM (PORCINE) 5000 UNIT/ML IJ SOLN
5000.0000 [IU] | Freq: Three times a day (TID) | INTRAMUSCULAR | Status: DC
  Administered 2021-05-10 – 2021-05-17 (×23): 5000 [IU] via SUBCUTANEOUS
  Filled 2021-05-09 (×24): qty 1

## 2021-05-09 MED ORDER — IOHEXOL 300 MG/ML  SOLN
75.0000 mL | Freq: Once | INTRAMUSCULAR | Status: AC | PRN
  Administered 2021-05-09: 64 mL via INTRAVENOUS

## 2021-05-09 MED ORDER — SODIUM CHLORIDE 0.9 % IV SOLN
1.0000 g | Freq: Once | INTRAVENOUS | Status: AC
  Administered 2021-05-09: 1 g via INTRAVENOUS
  Filled 2021-05-09: qty 10

## 2021-05-09 MED ORDER — SODIUM CHLORIDE 0.9 % IV SOLN
500.0000 mg | Freq: Once | INTRAVENOUS | Status: AC
  Administered 2021-05-10: 500 mg via INTRAVENOUS
  Filled 2021-05-09: qty 500

## 2021-05-09 NOTE — ED Notes (Signed)
Pt resting comfortably, NAD, states all needs met at this time

## 2021-05-09 NOTE — ED Notes (Signed)
Patient transported to CT 

## 2021-05-09 NOTE — ED Provider Notes (Signed)
Emergency Medicine Provider Triage Evaluation Note  [name] , a 80 y.o. adult  was evaluated in triage.  Pt complains of cough for 3 weeks   Review of Systems  Positive: cough Negative: fever  Physical Exam  BP (!) 157/76 (BP Location: Right Arm)   Pulse (!) 58   Temp 98.3 F (36.8 C) (Oral)   Resp 18   SpO2 100%  Gen:   Awake, no distress   Resp:  Normal effort  MSK:   Moves extremities without difficulty  Other:    Medical Decision Making  Medically screening exam initiated at 4:24 PM.  Appropriate orders placed.  [name] was informed that the remainder of the evaluation will be completed by another provider, this initial triage assessment does not replace that evaluation, and the importance of remaining in the ED until their evaluation is complete.     Fransico Meadow, [place] 05/09/21 1624    Truddie Hidden, MD 05/09/21 [phone]

## 2021-05-09 NOTE — ED Notes (Signed)
Family updated at this time.

## 2021-05-09 NOTE — ED Notes (Signed)
Attempted to call report, UC trying to find RN Neil-phone off.

## 2021-05-09 NOTE — ED Provider Notes (Signed)
[facility] EMERGENCY DEPARTMENT Provider Note  CSN: [id number] Arrival date & time: 05/09/21 1511    History Chief Complaint  Patient presents with   Cough    [name] is a 80 y.o. adult with history of multiple medical problems currently living at home with a roommate. She is largely bed-bound due to general weakness, neuropathy has had a cough for about a month, productive of clear sputum, no fever. No CP, SOB. She has been on two different antibiotics although she doesn't know the names. She does sometimes get choked on her food/drink. She reports her PCP is in the process of getting her placed at [facility] to 'get her strength back' but that may still be several days. She reports her knee hurts but that is also chronic.    Past Medical History:  Diagnosis Date   Anxiety    Arthritis    Bronchitis    Bronchitis    CKD (chronic kidney disease) stage 3, GFR 30-59 ml/min ([place]) 04/21/349   Complication of anesthesia    pt states she had a cardiac arrest during hysterectomy 1985.  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 [facility])    pt states she has to have her creatinine level checked often    Moderate single current episode of major depressive disorder ([place]) 03/12/2021   Nausea 03/03/2017   Peripheral neuropathy    bilateral feet   Seasonal allergies 03/27/2019   Sinusitis 05/06/2018   UTI (urinary tract infection) 03/03/2017   Weight loss 03/03/2017    Past Surgical History:  Procedure Laterality Date   ABDOMINAL HYSTERECTOMY  1985   BACK SURGERY     BIOPSY  07/15/2020   Procedure: BIOPSY;  Surgeon: Daneil Dolin, MD;  Location: [facility] ENDO [facility];  Service: Endoscopy;;   CATARACT  EXTRACTION, BILATERAL     CHOLECYSTECTOMY     COLONOSCOPY  08/10/2002   NUR: Normal colonoscopy except for some hemorrhoids and some erythema at the dentate line   COLONOSCOPY  12/10/2006   [place]:Diffuse colitis in the transverse and descending colon/This is not entirely typical of ischemic colitis or her HIV positive/ disease.  We need to be concerned about other causes   COLONOSCOPY  01/19/2011   RMR: Internal and external hemorrhoids, likely source of hematochezia anal papilla, otherwise normal rectal mucosa/ Left-sided diverticula.  Cecal polyp with status post cold snare polypectomy.  Remainder of colonic mucosa appeared normal. Pathology with tubular adenoma. Repeat in 2017   COLONOSCOPY WITH PROPOFOL N/A 07/15/2020   Procedure: COLONOSCOPY WITH PROPOFOL;  Surgeon: Daneil Dolin, MD;  Location: [facility] END[facility] SU[facility];  Service: Endoscopy;  Laterality: N/A;  8:15AM TCS via Ostomy   COLOSTOMY N/A 11/12/2018   Procedure: COLOSTOMY;  Surgeon: Virl Cagey, MD;  Location: [facility] ORS;  Service: General;  Laterality: N/A;   COLOSTOMY REVERSAL N/A 10/02/2020   Procedure: COLOSTOMY REVERSAL;  Surgeon: Virl Cagey, MD;  Location: [facility] ORS;  Service: General;  Laterality: N/A;   ESOPHAGOGASTRODUODENOSCOPY  08/10/2002     NUR: Mild changes of reflux esophagitis limited to gastroesophageal junction  No evidence of ring, stricture, or esophageal candidiasis dysphagia/ Gastritis, possibly H.  pylori induced   ESOPHAGOGASTRODUODENOSCOPY  12/25/2002   NUR: Erosive antral gastritis.  The degree of gastritis is more significant than her last exam/ Normal examination of the esophagus/ Esophageal dilatation performed by passing 30 and [address] [place]   ESOPHAGOGASTRODUODENOSCOPY  11/13/2010   RMR: Circumferential distal esophageal erosions with soft peptic stricture, consistent with erosive reflux esophagitis or stricture  formation, status post dilation as described above/  Small hiatal hernia/ Tiny antral  erosions, otherwise normal stomach D1 and D2   ESOPHAGOGASTRODUODENOSCOPY (EGD) WITH PROPOFOL N/A 07/15/2020   Procedure: ESOPHAGOGASTRODUODENOSCOPY (EGD) WITH PROPOFOL;  Surgeon: Daneil Dolin, MD;  Location[facility];  Service: Endoscopy;  Laterality: N/A;   FLEXIBLE SIGMOIDOSCOPY N/A 07/15/2020   Procedure: FLEXIBLE SIGMOIDOSCOPY;  Surgeon: Daneil Dolin, MD;  Location: [facility];  Service: Endoscopy;  Laterality: N/A;   HEMORRHOID SURGERY  09/02/2012   Procedure: HEMORRHOIDECTOMY;  Surgeon: Jamesetta So, MD;  Location: [facility] ORS;  Service: General;  Laterality: N/A;   LAPAROTOMY N/A 11/12/2018   Procedure: EXPLORATORY LAPAROTOMY;  Surgeon: Virl Cagey, MD;  Location: [facility] ORS;  Service: General;  Laterality: N/A;   MALONEY DILATION N/A 07/15/2020   Procedure: Keturah Shavers;  Surgeon: Daneil Dolin, MD;  Location: [facility];  Service: Endoscopy;  Laterality: N/A;   PARTIAL COLECTOMY N/A 11/12/2018   Procedure: PARTIAL COLECTOMY;  Surgeon: Virl Cagey, MD;  Location: [facility] ORS;  Service: General;  Laterality: N/A;   POLYPECTOMY  07/15/2020   Procedure: POLYPECTOMY;  Surgeon: Daneil Dolin, MD;  Location: [facility];  Service: Endoscopy;;   TUBAL LIGATION      Family History  Problem Relation Age of Onset   Diabetes Mother    Diabetes Brother    Colon cancer Brother        Diagnosed > age 72    Social History   Tobacco Use   Smoking status: Never   Smokeless tobacco: Never  Vaping Use   Vaping Use: Never used  Substance Use Topics   Alcohol use: No   Drug use: No     Home Medications Prior to Admission medications   Medication Sig Start Date End Date Taking? Authorizing Provider  abacavir-dolutegravir-lamiVUDine (TRIUMEQ) 600-50-300 MG tablet Take 1 tablet by mouth daily. 03/12/21   Truman Hayward, MD  acetaminophen (TYLENOL) 500 MG tablet Take 2 tablets (1,000 mg total) by mouth every 6 (six) hours as needed for mild pain or moderate pain.  10/08/20   Virl Cagey, MD  albuterol (PROVENTIL HFA;VENTOLIN HFA) 108 (90 Base) MCG/ACT inhaler Inhale 1-2 puffs into the lungs every 6 (six) hours as needed for wheezing or shortness of breath. 04/02/17   Nat Christen, MD  carvedilol (COREG) 6.25 MG tablet Take 6.25 mg by mouth 2 (two) times daily. 04/23/21   [provider]  Cholecalciferol (VITAMIN D3) 10000 units TABS Take 1 tablet by mouth daily. Patient not taking: No sig reported    [provider]  diphenhydrAMINE (BENADRYL) 25 mg capsule Take 25 mg by mouth daily as needed for itching (itching from codeine).     [provider]  docusate sodium (COLACE) 100 MG capsule Take 1 capsule (100 mg total) by mouth 2 (two) times daily. 11/21/18   Virl Cagey, MD  DULoxetine (CYMBALTA) 20 MG capsule Take 20 mg by mouth at bedtime.    [provider]  fluticasone (FLONASE) 50 MCG/ACT nasal spray 2 SPRAYS INTO BOTH NOSTRILS AT BEDTIME.  Patient taking differently: Place 2 sprays into both nostrils at bedtime. 05/22/19   Truman Hayward, MD  furosemide (LASIX) 40 MG tablet Take 1 tablet (40 mg total) by mouth daily as needed for fluid. Hold until follow up with PCP 12/31/19   Barton Dubois, MD  ipratropium-albuterol (DUONEB) 0.5-2.5 (3) MG/3ML SOLN Inhale 3 mLs into the lungs every 6 (six) hours as needed (shortness of breath).  04/02/17   [provider]  LORazepam (ATIVAN) 0.5 MG tablet Take 0.5 mg by mouth 2 (two) times daily.  11/01/18   [provider]  losartan (COZAAR) 50 MG tablet Take 50 mg by mouth daily. 09/10/20   [provider]  magnesium 30 MG tablet Take 30 mg by mouth at bedtime.     [provider]  Multiple Vitamin (ONE-A-DAY 55 PLUS PO) Take 1 tablet by mouth daily.    [provider]  nebivolol (BYSTOLIC) 5 MG tablet Take 1 tablet (5 mg total) by mouth daily. 02/27/20   Arnoldo Lenis, MD  nystatin (MYCOSTATIN/NYSTOP) powder Apply 1  application topically 3 (three) times daily. Apply to groin region until redness and irritation improving 10/15/20   Virl Cagey, MD  Omega-3 Fatty Acids (FISH OIL BURP-LESS) 1000 MG CAPS Take 1 capsule by mouth daily. Patient not taking: No sig reported    [provider]  ondansetron (ZOFRAN) 8 MG tablet Take 8 mg by mouth daily as needed for nausea or vomiting.  Patient not taking: No sig reported    [provider]  pantoprazole (PROTONIX) 40 MG tablet Take 1 tablet (40 mg total) by mouth daily before breakfast. 07/16/20 07/16/21  Erenest Rasher, PA-C  polyvinyl alcohol (LIQUIFILM TEARS) 1.4 % ophthalmic solution Place 1 drop into both eyes daily.    [provider]  psyllium (HYDROCIL/METAMUCIL) 95 % PACK Take 1 packet by mouth daily. 10/09/20   Virl Cagey, MD  thiamine (VITAMIN B-1) 100 MG tablet Take 100 mg by mouth daily.    [provider]  XTAMPZA ER 13.5 MG C12A Take 1 capsule by mouth 2 (two) times daily. Patient not taking: No sig reported 08/16/20   [provider]     Allergies    Bee venom, Promethazine, Tenofovir disoproxil fumarate [tenofovir disoproxil fumarate], Compazine  [prochlorperazine edisylate], Haloperidol lactate, Sulfa antibiotics, Brompheniramine-acetaminophen, Chlorcyclizine, Chlorpromazine, Codeine, Navane [thiothixene], Prednisone, Promethazine hcl, Propoxyphene n-acetaminophen, and Morphine   Review of Systems   Review of Systems A comprehensive review of systems was completed and negative except as noted in HPI.    Physical Exam BP (!) 170/78   Pulse 62   Temp 98.3 F (36.8 C) (Oral)   Resp 19   SpO2 98%   Physical Exam Vitals and nursing note reviewed.  Constitutional:      Appearance: Normal appearance.  HENT:     Head: Normocephalic and atraumatic.     Nose: Nose normal.     Mouth/Throat:     Mouth: Mucous membranes are moist.  Eyes:     Extraocular Movements: Extraocular  movements intact.     Conjunctiva/sclera: Conjunctivae normal.  Cardiovascular:     Rate and Rhythm: Normal rate.  Pulmonary:     Effort: Pulmonary effort is normal.     Breath sounds: Rhonchi present. No wheezing or rales.  Abdominal:     General: Abdomen is flat.     Palpations: Abdomen is soft.     Tenderness: There is no abdominal tenderness.  Musculoskeletal:        General: No swelling. Normal range of motion.     Cervical back: Neck supple.  Skin:    General: Skin is warm and dry.  Neurological:     General: No focal deficit present.     Mental Status: She is alert and oriented to person, place, and time.  Psychiatric:     Comments: Flat affect     ED Results / Procedures / Treatments   Labs (all labs ordered are listed, but only abnormal results are displayed) Labs Reviewed  CBC WITH DIFFERENTIAL/PLATELET - Abnormal; Notable for the following components:      Result Value   MCV 105.2 (*)    MCH 36.6 (*)    All other components within normal limits  COMPREHENSIVE METABOLIC PANEL - Abnormal; Notable for the following components:   Glucose, Bld 101 (*)    BUN 27 (*)    Creatinine, Ser 1.28 (*)    Calcium 10.4 (*)    GFR, Estimated 42 (*)    All other components within normal limits  RESP PANEL BY RT-PCR (FLU A&B, COVID) [id number]    EKG None  Radiology DG Chest 2 View  Result Date: 05/09/2021 CLINICAL DATA:  Cough EXAM: CHEST - 2 VIEW COMPARISON:  12/26/2020, 12/23/2020 FINDINGS: A rounded density has developed within the right hilar region possibly representing vascular shadow, hilar adenopathy, or a perihilar pulmonary nodule. The lungs are otherwise clear. No pneumothorax or pleural effusion. Cardiac size within normal limits. Pulmonary vascularity is normal. No acute bone abnormality. IMPRESSION: No radiographic evidence of acute cardiopulmonary disease. Interval development of a rounded opacity within the right hilar region. See differential considerations  above. This could be better assessed with dedicated contrast enhanced CT examination of the chest. Alternatively, follow-up chest radiograph in 4-6 weeks may helpful in documenting resolution. Electronically Signed   By: Fidela Salisbury MD   On: 05/09/2021 17:50   CT Chest W Contrast  Result Date: 05/09/2021 CLINICAL DATA:  Weakness and cough for 3 weeks, wheezing, abnormal chest x-ray at right hilum EXAM: CT CHEST WITH CONTRAST TECHNIQUE: Multidetector CT imaging of the chest was performed during intravenous contrast administration. CONTRAST:  37mL OMNIPAQUE IOHEXOL 300 MG/ML  SOLN COMPARISON:  05/09/2021 FINDINGS: Cardiovascular: The heart is unremarkable without pericardial effusion. Prominent atherosclerosis of the distal aortic arch and descending thoracic aorta. No evidence of aneurysm or dissection. Mediastinum/Nodes: No enlarged mediastinal, hilar, or axillary lymph nodes. Thyroid gland, trachea, and esophagus demonstrate no significant findings. Lungs/Pleura: There are patchy bilateral areas of ground-glass airspace disease most pronounced within the suprahilar regions, which could reflect early edema or atypical viral infection such as COVID-19. No effusion or pneumothorax. The central airways are patent. Upper Abdomen: No acute abnormality. Musculoskeletal: No acute or destructive bony lesions. Reconstructed images demonstrate no additional findings. IMPRESSION: 1. Patchy upper lobe predominant ground-glass airspace disease consistent with multifocal infection or early edema. 2. No abnormality at the right hilum to correspond to the chest x-ray finding. The rounded density at the right hilum on chest x-ray likely reflects a pulmonary vessel seen on end or clothing artifact. 3.  Aortic Atherosclerosis (ICD10-I70.0). Electronically Signed   By: Randa Ngo M.D.   On: 05/09/2021 21:08    Procedures Procedures  Medications Ordered in the ED Medications  metroNIDAZOLE (FLAGYL) IVPB 500 mg (500 mg  Intravenous New Bag/Given 05/09/21 2307)  azithromycin (ZITHROMAX) 500 mg in sodium chloride 0.9 % 250 mL IVPB (has no administration in  time range)  iohexol (OMNIPAQUE) 300 MG/ML solution 75 mL (64 mLs Intravenous Contrast Given 05/09/21 2059)  cefTRIAXone (ROCEPHIN) 1 g in sodium chloride 0.9 % 100 mL IVPB (1 g Intravenous New Bag/Given 05/09/21 2236)     MDM Rules/Calculators/A&P MDM Patient with multiple chronic complaints. She states she was advised by her doctor to come to the ED for evaluation of her cough. CBC is normal, BMP is at baseline. CXR pending.   ED Course  I have reviewed the triage vital signs and the nursing notes.  Pertinent labs & imaging results that were available during my care of the patient were reviewed by me and considered in my medical decision making (see chart for details).  Clinical Course as of 05/09/21 2337  Fri May 09, 2021  1830 CXR concerning for new rounded opacity, recommends CT with contrast.  [CS]  2142 CT neg for mass but does show multifocal upper lung infiltrate. Concerning for aspiration given her history. Will begin Rocephin/Flagyl to cover and discuss with hospitalist.  [CS]  2209 Spoke with Dr. Josephine Cables, Hospitalist, who will evaluate for admission.  [CS]    Clinical Course User Index [CS] Truddie Hidden, MD    Final Clinical Impression(s) / ED Diagnoses Final diagnoses:  Community acquired pneumonia, unspecified laterality    Rx / DC Orders ED Discharge Orders     None        Truddie Hidden, MD 05/09/21 2337

## 2021-05-09 NOTE — ED Notes (Signed)
Family updated as to patient's status.

## 2021-05-09 NOTE — ED Notes (Signed)
Report to [facility]

## 2021-05-09 NOTE — ED Triage Notes (Signed)
C/o weakness with cough for 3 weeks

## 2021-05-09 NOTE — ED Notes (Signed)
Pt changed to a dry brief, and pure wick applied.

## 2021-05-09 NOTE — ED Notes (Signed)
Pt return from CT.

## 2021-05-09 NOTE — H&P (Signed)
History and Physical  [name] ZOX:[id number] DOB: [birth year] DOA: 05/09/2021  Referring physician: Truddie Hidden, MD PCP: Wannetta Sender, FNP  Patient coming from: Home  Chief Complaint: Cough  HPI: [name] is an 80 y.o. adult with medical history significant for peripheral neuropathy, DDD, depression, COPD, CKD stage IIIB, hypertension, HIV infection presents to the emergency department due to 4-week onset of cough, sore throat, subjective fever, intermittent diaphoresis and generalized weakness to the extent that patient has difficulty in being able to ambulate, she states that she has been mostly bedbound/recliner bound since onset of symptoms about 4 weeks ago.  Patient states that she was given 2 different antibiotics, but she was unable to remember the names.  She endorsed occasional choking on food/drink.  Patient states that her PCP was trying to place at [facility] for rehab so as to get her strength back.  Patient lives with a roommate.  ED Course: In the emergency department, she was intermittently bradycardic.  BP was 190/74, other vital signs are within normal range.  Work-up in the ED showed MCV of 105.13F, BUN to creatinine was 27/1.28 (baseline creatinine at 1.2-1.7).  Influenza A, B, SARS coronavirus 2 was negative. Chest x-ray showed no radiographic evidence of acute cardiopulmonary disease CT of chest with contrast showed Patchy upper lobe predominant ground-glass airspace disease consistent with multifocal infection or early edema. She was empirically started on IV ceftriaxone and azithromycin for presumed community-acquired pneumonia.  Hospitalist was asked to admit patient for further evaluation and management.  Review of Systems: Constitutional: Positive for subjective fever.  Negative for chills HENT: Negative for ear pain and sore throat.   Eyes: Negative for pain and visual disturbance.  Respiratory: Positive for cough, chest congestion  and shortness of breath.   Cardiovascular: Negative for chest pain and palpitations.  Gastrointestinal: Negative for abdominal pain and vomiting.  Endocrine: Negative for polyphagia and polyuria.  Genitourinary: Negative for decreased urine volume, dysuria, enuresis Musculoskeletal: Negative for arthralgias and back pain.  Skin: Negative for color change and rash.  Allergic/Immunologic: Negative for immunocompromised state.  Neurological: Positive for weakness.  Negative for tremors, syncope, speech difficulty Hematological: Does not bruise/bleed easily.  All other systems reviewed and are negative   Past Medical History:  Diagnosis Date   Anxiety    Arthritis    Bronchitis    Bronchitis    CKD (chronic kidney disease) stage 3, GFR 30-59 ml/min (HCC) 06/05/9146   Complication of anesthesia    pt states she had a cardiac arrest during hysterectomy 1985.  Pt had several surgies since then that were uneventful   COPD (chronic obstructive pulmonary disease) ([place])    Depression    Dysuria 03/27/2019   Elevated LFTs 05/04/2016   History of TIAs 1981   left side weakness   HIV (human immunodeficiency virus infection) ([place])    Hypertension    Kidney disease related to HIV infection [facility])    pt states she has to have her creatinine level checked often    Moderate single current episode of major depressive disorder ([place]) 03/12/2021   Nausea 03/03/2017   Peripheral neuropathy    bilateral feet   Seasonal allergies 03/27/2019   Sinusitis 05/06/2018   UTI (urinary tract infection) 03/03/2017   Weight loss 03/03/2017   Past Surgical History:  Procedure Laterality Date   ABDOMINAL HYSTERECTOMY  1985   BACK SURGERY     BIOPSY  07/15/2020   Procedure: BIOPSY;  Surgeon:  Daneil Dolin, MD;  [facility];  Service: Endoscopy;;   CATARACT EXTRACTION, BILATERAL     CHOLECYSTECTOMY     COLONOSCOPY  08/10/2002   NUR: Normal colonoscopy except for some hemorrhoids and some erythema at the  dentate line   COLONOSCOPY  12/10/2006   Edwards:Diffuse colitis in the transverse and descending colon/This is not entirely typical of ischemic colitis or her HIV positive/ disease.  We need to be concerned about other causes   COLONOSCOPY  01/19/2011   RMR: Internal and external hemorrhoids, likely source of hematochezia anal papilla, otherwise normal rectal mucosa/ Left-sided diverticula.  Cecal polyp with status post cold snare polypectomy.  Remainder of colonic mucosa appeared normal. Pathology with tubular adenoma. Repeat in 2017   COLONOSCOPY WITH PROPOFOL N/A 07/15/2020   Procedure: COLONOSCOPY WITH PROPOFOL;  Surgeon: Daneil Dolin, MD;  Location: [facility];  Service: Endoscopy;  Laterality: N/A;  8:15AM TCS via Ostomy   COLOSTOMY N/A 11/12/2018   Procedure: COLOSTOMY;  Surgeon: Virl Cagey, MD;  Location: [facility] ORS;  Service: General;  Laterality: N/A;   COLOSTOMY REVERSAL N/A 10/02/2020   Procedure: COLOSTOMY REVERSAL;  Surgeon: Virl Cagey, MD;  Location: [facility] ORS;  Service: General;  Laterality: N/A;   ESOPHAGOGASTRODUODENOSCOPY  08/10/2002     NUR: Mild changes of reflux esophagitis limited to gastroesophageal junction  No evidence of ring, stricture, or esophageal candidiasis dysphagia/ Gastritis, possibly H. pylori induced   ESOPHAGOGASTRODUODENOSCOPY  12/25/2002   NUR: Erosive antral gastritis.  The degree of gastritis is more significant than her last exam/ Normal examination of the esophagus/ Esophageal dilatation performed by passing 59 and [address] [place]   ESOPHAGOGASTRODUODENOSCOPY  11/13/2010   RMR: Circumferential distal esophageal erosions with soft peptic stricture, consistent with erosive reflux esophagitis or stricture  formation, status post dilation as described above/  Small hiatal hernia/ Tiny antral erosions, otherwise normal stomach D1 and D2   ESOPHAGOGASTRODUODENOSCOPY (EGD) WITH PROPOFOL N/A 07/15/2020   Procedure: ESOPHAGOGASTRODUODENOSCOPY (EGD)  WITH PROPOFOL;  Surgeon: Daneil Dolin, MD;  [facility];  Service: Endoscopy;  Laterality: N/A;   FLEXIBLE SIGMOIDOSCOPY N/A 07/15/2020   Procedure: FLEXIBLE SIGMOIDOSCOPY;  Surgeon: Daneil Dolin, MD;  [facility];  Service: Endoscopy;  Laterality: N/A;   HEMORRHOID SURGERY  09/02/2012   Procedure: HEMORRHOIDECTOMY;  Surgeon: Jamesetta So, MD;  Location: [facility];  Service: General;  Laterality: N/A;   LAPAROTOMY N/A 11/12/2018   Procedure: EXPLORATORY LAPAROTOMY;  Surgeon: Virl Cagey, MD;  Location: [facility];  Service: General;  Laterality: N/A;   MALONEY DILATION N/A 07/15/2020   Procedure: Keturah Shavers;  Surgeon: Daneil Dolin, MD;  [facility];  Service: Endoscopy;  Laterality: N/A;   PARTIAL COLECTOMY N/A 11/12/2018   Procedure: PARTIAL COLECTOMY;  Surgeon: Virl Cagey, MD;  Location: [facility];  Service: General;  Laterality: N/A;   POLYPECTOMY  07/15/2020   Procedure: POLYPECTOMY;  Surgeon: Daneil Dolin, MD;  [facility];  Service: Endoscopy;;   TUBAL LIGATION      Social History:  reports that she has never smoked. She has never used smokeless tobacco. She reports that she does not drink alcohol and does not use drugs.   Allergies  Allergen Reactions   Bee Venom Anaphylaxis   Promethazine Swelling   Tenofovir Disoproxil Fumarate [Tenofovir Disoproxil Fumarate] Other (See Comments)    Renal failure, (08/27/2006)   Compazine  [Prochlorperazine Edisylate] Nausea And  Vomiting   Haloperidol Lactate Other (See Comments)    Reaction is muscle tension, causes severe spasms in face and neck. Forces eyes to roll in the back of the head   Sulfa Antibiotics Diarrhea   Brompheniramine-Acetaminophen    Chlorcyclizine    Chlorpromazine    Codeine Itching and Nausea And Vomiting   Navane [Thiothixene] Other (See Comments) and Nausea And Vomiting    Same muscle spasm reaction as haldol   Prednisone     Brief mild psychosis  and agitation   Promethazine Hcl Other (See Comments)   Propoxyphene N-Acetaminophen Itching and Nausea And Vomiting   Morphine Itching and Swelling    Family History  Problem Relation Age of Onset   Diabetes Mother    Diabetes Brother    Colon cancer Brother        Diagnosed > age 29     Prior to Admission medications   Medication Sig Start Date End Date Taking? Authorizing Provider  abacavir-dolutegravir-lamiVUDine (TRIUMEQ) 600-50-300 MG tablet Take 1 tablet by mouth daily. 03/12/21   Truman Hayward, MD  acetaminophen (TYLENOL) 500 MG tablet Take 2 tablets (1,000 mg total) by mouth every 6 (six) hours as needed for mild pain or moderate pain. 10/08/20   Virl Cagey, MD  albuterol (PROVENTIL HFA;VENTOLIN HFA) 108 (90 Base) MCG/ACT inhaler Inhale 1-2 puffs into the lungs every 6 (six) hours as needed for wheezing or shortness of breath. 04/02/17   Nat Christen, MD  carvedilol (COREG) 6.25 MG tablet Take 6.25 mg by mouth 2 (two) times daily. 04/23/21   [provider]  Cholecalciferol (VITAMIN D3) 10000 units TABS Take 1 tablet by mouth daily. Patient not taking: No sig reported    [provider]  diphenhydrAMINE (BENADRYL) 25 mg capsule Take 25 mg by mouth daily as needed for itching (itching from codeine).     [provider]  docusate sodium (COLACE) 100 MG capsule Take 1 capsule (100 mg total) by mouth 2 (two) times daily. 11/21/18   Virl Cagey, MD  DULoxetine (CYMBALTA) 20 MG capsule Take 20 mg by mouth at bedtime.    [provider]  fluticasone (FLONASE) 50 MCG/ACT nasal spray 2 SPRAYS INTO BOTH NOSTRILS AT BEDTIME. Patient taking differently: Place 2 sprays into both nostrils at bedtime. 05/22/19   Truman Hayward, MD  furosemide (LASIX) 40 MG tablet Take 1 tablet (40 mg total) by mouth daily as needed for fluid. Hold until follow up with PCP 12/31/19   Barton Dubois, MD  ipratropium-albuterol (DUONEB) 0.5-2.5 (3) MG/3ML SOLN  Inhale 3 mLs into the lungs every 6 (six) hours as needed (shortness of breath).  04/02/17   [provider]  LORazepam (ATIVAN) 0.5 MG tablet Take 0.5 mg by mouth 2 (two) times daily.  11/01/18   [provider]  losartan (COZAAR) 50 MG tablet Take 50 mg by mouth daily. 09/10/20   [provider]  magnesium 30 MG tablet Take 30 mg by mouth at bedtime.     [provider]  Multiple Vitamin (ONE-A-DAY 55 PLUS PO) Take 1 tablet by mouth daily.    [provider]  nebivolol (BYSTOLIC) 5 MG tablet Take 1 tablet (5 mg total) by mouth daily. 02/27/20   Arnoldo Lenis, MD  nystatin (MYCOSTATIN/NYSTOP) powder Apply 1 application topically 3 (three) times daily. Apply to groin region until redness and irritation improving 10/15/20   Virl Cagey, MD  Omega-3 Fatty Acids (FISH OIL  BURP-LESS) 1000 MG CAPS Take 1 capsule by mouth daily. Patient not taking: No sig reported    [provider]  ondansetron (ZOFRAN) 8 MG tablet Take 8 mg by mouth daily as needed for nausea or vomiting.  Patient not taking: No sig reported    [provider]  pantoprazole (PROTONIX) 40 MG tablet Take 1 tablet (40 mg total) by mouth daily before breakfast. 07/16/20 07/16/21  Erenest Rasher, PA-C  polyvinyl alcohol (LIQUIFILM TEARS) 1.4 % ophthalmic solution Place 1 drop into both eyes daily.    [provider]  psyllium (HYDROCIL/METAMUCIL) 95 % PACK Take 1 packet by mouth daily. 10/09/20   Virl Cagey, MD  thiamine (VITAMIN B-1) 100 MG tablet Take 100 mg by mouth daily.    [provider]  XTAMPZA ER 13.5 MG C12A Take 1 capsule by mouth 2 (two) times daily. Patient not taking: No sig reported 08/16/20   [provider]    Physical Exam: BP (!) 175/86 (BP Location: Right Arm)   Pulse 62   Temp 98.3 F (36.8 C) (Oral)   Resp 20   SpO2 98%   General: 80 y.o. year-old adult well developed and well nourished in no acute  distress.  Alert and oriented x3. HEENT: NCAT, EOMI Neck: Supple, trachea medial Cardiovascular: Regular rate and rhythm with no rubs or gallops.  No thyromegaly or JVD noted.  No lower extremity edema. 2/4 pulses in all 4 extremities. Respiratory: Diffuse rhonchi.  No wheezes or rales.  Abdomen: Soft, nontender nondistended with normal bowel sounds x4 quadrants. Muskuloskeletal: No cyanosis, clubbing or edema noted bilaterally Neuro: CN II-XII intact, strength 5/5 x 4, sensation, reflexes intact Skin: No ulcerative lesions noted or rashes Psychiatry:  Mood is appropriate for condition and setting          Labs on Admission:  Basic Metabolic Panel: Recent Labs  Lab 05/09/21 1633  NA 137  K 4.5  CL 104  CO2 27  GLUCOSE 101*  BUN 27*  CREATININE 1.28*  CALCIUM 10.4*   Liver Function Tests: Recent Labs  Lab 05/09/21 1633  AST 19  ALT 19  ALKPHOS 97  BILITOT 0.6  PROT 7.5  ALBUMIN 4.5   No results for input(s): LIPASE, AMYLASE in the last 168 hours. No results for input(s): AMMONIA in the last 168 hours. CBC: Recent Labs  Lab 05/09/21 1633  WBC 7.2  NEUTROABS 3.7  HGB 14.9  HCT 42.8  MCV 105.2*  PLT 237   Cardiac Enzymes: No results for input(s): CKTOTAL, CKMB, CKMBINDEX, TROPONINI in the last 168 hours.  BNP (last 3 results) No results for input(s): BNP in the last 8760 hours.  ProBNP (last 3 results) No results for input(s): PROBNP in the last 8760 hours.  CBG: No results for input(s): GLUCAP in the last 168 hours.  Radiological Exams on Admission: DG Chest 2 View  Result Date: 05/09/2021 CLINICAL DATA:  Cough EXAM: CHEST - 2 VIEW COMPARISON:  12/26/2020, 12/23/2020 FINDINGS: A rounded density has developed within the right hilar region possibly representing vascular shadow, hilar adenopathy, or a perihilar pulmonary nodule. The lungs are otherwise clear. No pneumothorax or pleural effusion. Cardiac size within normal limits. Pulmonary vascularity is  normal. No acute bone abnormality. IMPRESSION: No radiographic evidence of acute cardiopulmonary disease. Interval development of a rounded opacity within the right hilar region. See differential considerations above. This could be better assessed with dedicated contrast enhanced CT examination of the chest. Alternatively, follow-up chest  radiograph in 4-6 weeks may helpful in documenting resolution. Electronically Signed   By: Fidela Salisbury MD   On: 05/09/2021 17:50   CT Chest W Contrast  Result Date: 05/09/2021 CLINICAL DATA:  Weakness and cough for 3 weeks, wheezing, abnormal chest x-ray at right hilum EXAM: CT CHEST WITH CONTRAST TECHNIQUE: Multidetector CT imaging of the chest was performed during intravenous contrast administration. CONTRAST:  48mL OMNIPAQUE IOHEXOL 300 MG/ML  SOLN COMPARISON:  05/09/2021 FINDINGS: Cardiovascular: The heart is unremarkable without pericardial effusion. Prominent atherosclerosis of the distal aortic arch and descending thoracic aorta. No evidence of aneurysm or dissection. Mediastinum/Nodes: No enlarged mediastinal, hilar, or axillary lymph nodes. Thyroid gland, trachea, and esophagus demonstrate no significant findings. Lungs/Pleura: There are patchy bilateral areas of ground-glass airspace disease most pronounced within the suprahilar regions, which could reflect early edema or atypical viral infection such as COVID-19. No effusion or pneumothorax. The central airways are patent. Upper Abdomen: No acute abnormality. Musculoskeletal: No acute or destructive bony lesions. Reconstructed images demonstrate no additional findings. IMPRESSION: 1. Patchy upper lobe predominant ground-glass airspace disease consistent with multifocal infection or early edema. 2. No abnormality at the right hilum to correspond to the chest x-ray finding. The rounded density at the right hilum on chest x-ray likely reflects a pulmonary vessel seen on end or clothing artifact. 3.  Aortic  Atherosclerosis (ICD10-I70.0). Electronically Signed   By: Randa Ngo M.D.   On: 05/09/2021 21:08    EKG: I independently viewed the EKG done and my findings are as followed: EKG was not done in the ED  Assessment/Plan Present on Admission:  CAP (community acquired pneumonia)  CKD (chronic kidney disease) stage 3, GFR 30-59 ml/min (HCC)  COPD (chronic obstructive pulmonary disease) (HCC)  HIV disease ([place])  Principal Problem:   CAP (community acquired pneumonia) Active Problems:   HIV disease ([place])   Esophageal dysphagia   Essential hypertension   CKD (chronic kidney disease) stage 3, GFR 30-59 ml/min (HCC)   COPD (chronic obstructive pulmonary disease) (HCC)   Generalized weakness   Elevated MCV  Presumed CAP POA s/p failure of outpatient treatment CT chest was suggestive of multifocal pneumonia Patient has tried 2 rounds of outpatient antibiotics without improvement Patient was started on IV ceftriaxone and azithromycin, we shall continue with same at this time with plan to de-escalate or discontinue based on blood culture, sputum culture, urine Legionella, strep pneumo and procalcitonin Continue Mucinex, incentive spirometry, flutter valve  Generalized weakness with difficulty in ambulation Possibly secondary to above Continue fall precaution and neurochecks Continue PT/OT eval and treat  COPD Continue albuterol  GERD Continue on Protonix  History of esophageal dysphagia Bedside swallow eval prior to oral intake Consider SLP if patient fails bedside swallow eval Patient may require outpatient gastroenterology follow-up  Elevated MCV MCV 105.2; vitamin B12 and folate levels will be checked  Hypercalcemia Ca 10.4; continue IV hydration Continue to monitor calcium levels with morning labs  Essential hypertension Continue losartan and Bystolic  CKD stage IIIb BUN to creatinine was 27/1.28 (baseline creatinine at 1.2-1.7) Renally adjust medications, avoid  nephrotoxic agents/dehydration/hypotension  HIV disease Continue Triumeq   DVT prophylaxis: Heparin subcu  Code Status: Full code  Family Communication: None at bedside  Disposition Plan:  Patient is from:                        home Anticipated DC to:  SNF or family members home Anticipated DC date:               2-3 days Anticipated DC barriers:         patient requires inpatient management due to presumed Which has failed outpatient treatment  Consults called: None  Admission status: Inpatient    Bernadette Hoit MD [facility]  05/10/2021, 12:50 AM

## 2021-05-10 ENCOUNTER — Other Ambulatory Visit: Payer: Self-pay

## 2021-05-10 DIAGNOSIS — R718 Other abnormality of red blood cells: Secondary | ICD-10-CM

## 2021-05-10 DIAGNOSIS — J189 Pneumonia, unspecified organism: Secondary | ICD-10-CM | POA: Diagnosis not present

## 2021-05-10 DIAGNOSIS — R531 Weakness: Secondary | ICD-10-CM

## 2021-05-10 LAB — FOLATE: Folate: 15.1 ng/mL (ref >5.9)

## 2021-05-10 LAB — EXPECTORATED SPUTUM ASSESSMENT W GRAM STAIN, RFLX TO RESP C

## 2021-05-10 LAB — COMPREHENSIVE METABOLIC PANEL
ALT: 18 U/L (ref 0–44)
AST: 20 U/L (ref 15–41)
Albumin: 4 g/dL (ref 3.5–5.0)
Alkaline Phosphatase: 88 U/L (ref 38–126)
Anion gap: 7 (ref 5–15)
BUN: 23 mg/dL (ref 8–23)
CO2: 25 mmol/L (ref 22–32)
Calcium: 10.1 mg/dL (ref 8.9–10.3)
Chloride: 106 mmol/L (ref 98–111)
Creatinine, Ser: 1.17 mg/dL — ABNORMAL HIGH (ref 0.44–1.00)
GFR, Estimated: 47 mL/min — ABNORMAL LOW (ref >60)
Glucose, Bld: 103 mg/dL — ABNORMAL HIGH (ref 70–99)
Potassium: 4.4 mmol/L (ref 3.5–5.1)
Sodium: 138 mmol/L (ref 135–145)
Total Bilirubin: 0.8 mg/dL (ref 0.3–1.2)
Total Protein: 6.9 g/dL (ref 6.5–8.1)

## 2021-05-10 LAB — CBC
HCT: 41.2 % (ref 36.0–46.0)
Hemoglobin: 14.2 g/dL (ref 12.0–15.0)
MCH: 35.9 pg — ABNORMAL HIGH (ref 26.0–34.0)
MCHC: 34.5 g/dL (ref 30.0–36.0)
MCV: 104 fL — ABNORMAL HIGH (ref 80.0–100.0)
Platelets: 199 10*3/uL (ref 150–400)
RBC: 3.96 MIL/uL (ref 3.87–5.11)
RDW: 13.2 % (ref 11.5–15.5)
WBC: 9.4 10*3/uL (ref 4.0–10.5)
nRBC: 0 % (ref 0.0–0.2)

## 2021-05-10 LAB — PROCALCITONIN: Procalcitonin: <0.1 ng/mL

## 2021-05-10 LAB — PROTIME-INR
INR: 1.1 (ref 0.8–1.2)
Prothrombin Time: 14.1 seconds (ref 11.4–15.2)

## 2021-05-10 LAB — C DIFFICILE QUICK SCREEN W PCR REFLEX
C Diff antigen: NEGATIVE
C Diff interpretation: NOT DETECTED
C Diff toxin: NEGATIVE

## 2021-05-10 LAB — PHOSPHORUS: Phosphorus: 2.9 mg/dL (ref 2.5–4.6)

## 2021-05-10 LAB — MAGNESIUM: Magnesium: 2 mg/dL (ref 1.7–2.4)

## 2021-05-10 LAB — APTT: aPTT: 31 seconds (ref 24–36)

## 2021-05-10 LAB — VITAMIN B12: Vitamin B-12: 729 pg/mL (ref 180–914)

## 2021-05-10 MED ORDER — ENSURE ENLIVE PO LIQD
237.0000 mL | Freq: Two times a day (BID) | ORAL | Status: DC
  Administered 2021-05-10 – 2021-05-16 (×13): 237 mL via ORAL

## 2021-05-10 MED ORDER — SODIUM CHLORIDE 0.9 % IV SOLN
500.0000 mg | INTRAVENOUS | Status: AC
  Administered 2021-05-10 – 2021-05-13 (×4): 500 mg via INTRAVENOUS
  Filled 2021-05-10 (×4): qty 500

## 2021-05-10 MED ORDER — MELATONIN 3 MG PO TABS
6.0000 mg | ORAL_TABLET | Freq: Once | ORAL | Status: AC
  Administered 2021-05-10: 6 mg via ORAL
  Filled 2021-05-10: qty 2

## 2021-05-10 MED ORDER — ABACAVIR-DOLUTEGRAVIR-LAMIVUD 600-50-300 MG PO TABS
1.0000 | ORAL_TABLET | Freq: Every day | ORAL | Status: DC
  Administered 2021-05-10 – 2021-05-17 (×7): 1 via ORAL
  Filled 2021-05-10 (×12): qty 1

## 2021-05-10 MED ORDER — DM-GUAIFENESIN ER 30-600 MG PO TB12
1.0000 | ORAL_TABLET | Freq: Two times a day (BID) | ORAL | Status: DC
  Administered 2021-05-10 – 2021-05-14 (×9): 1 via ORAL
  Filled 2021-05-10 (×9): qty 1

## 2021-05-10 MED ORDER — ALBUTEROL SULFATE (2.5 MG/3ML) 0.083% IN NEBU: 2.5000 mg | INHALATION_SOLUTION | Freq: Four times a day (QID) | RESPIRATORY_TRACT | Status: DC | PRN

## 2021-05-10 MED ORDER — LOSARTAN POTASSIUM 50 MG PO TABS
50.0000 mg | ORAL_TABLET | Freq: Every day | ORAL | Status: DC
  Administered 2021-05-10 – 2021-05-11 (×2): 50 mg via ORAL
  Filled 2021-05-10 (×2): qty 1

## 2021-05-10 MED ORDER — SODIUM CHLORIDE 0.9 % IV SOLN: Freq: Once | INTRAVENOUS | Status: AC

## 2021-05-10 MED ORDER — SODIUM CHLORIDE 0.9 % IV SOLN
1.0000 g | INTRAVENOUS | Status: DC
  Administered 2021-05-10 – 2021-05-14 (×5): 1 g via INTRAVENOUS
  Filled 2021-05-10 (×5): qty 10

## 2021-05-10 MED ORDER — PANTOPRAZOLE SODIUM 40 MG PO TBEC
40.0000 mg | DELAYED_RELEASE_TABLET | Freq: Every day | ORAL | Status: DC
  Administered 2021-05-10 – 2021-05-17 (×8): 40 mg via ORAL
  Filled 2021-05-10 (×9): qty 1

## 2021-05-10 MED ORDER — NEBIVOLOL HCL 10 MG PO TABS
5.0000 mg | ORAL_TABLET | Freq: Every day | ORAL | Status: DC
  Administered 2021-05-10 – 2021-05-11 (×2): 5 mg via ORAL
  Filled 2021-05-10 (×2): qty 1

## 2021-05-10 NOTE — Evaluation (Signed)
Physical Therapy Evaluation Patient Details Name: [name] MRN: [id number] DOB: 1941-06-13 Today's Date: 05/10/2021   History of Present Illness  [name] is an 80 y.o. adult with medical history significant for peripheral neuropathy, DDD, depression, COPD, CKD stage IIIB, hypertension, HIV infection presents to the emergency department due to 4-week onset of cough, sore throat, subjective fever, intermittent diaphoresis and generalized weakness to the extent that patient has difficulty in being able to ambulate, she states that she has been mostly bedbound/recliner bound since onset of symptoms about 4 weeks ago.  Patient states that she was given 2 different antibiotics, but she was unable to remember the names.  She endorsed occasional choking on food/drink.  Patient states that her PCP was trying to place at [facility] for rehab so as to get her strength back.  Patient lives with a roommate  Clinical Impression  Pt has not stood/ambulated in 4 weeks; pt is not safe to be at home and will benefit from SNF to improve mobility.    Follow Up Recommendations SNF    Equipment Recommendations  None recommended by PT    Recommendations for Other Services       Precautions / Restrictions Precautions Precautions: Fall Restrictions Weight Bearing Restrictions: No      Mobility  Bed Mobility Overal bed mobility: Needs Assistance Bed Mobility: Rolling;Sidelying to Sit;Sit to Sidelying Rolling: Min assist Sidelying to sit: Mod assist     Sit to sidelying: Min assist      Transfers Overall transfer level: Needs assistance Equipment used: Rolling walker (2 wheeled) Transfers: Sit to/from Stand Sit to Stand: Max assist            Ambulation/Gait   Gait Distance (Feet): 0 Feet                 Pertinent Vitals/Pain Pain Assessment: No/denies pain             Hand Dominance        Extremity/Trunk Assessment        Lower Extremity  Assessment Lower Extremity Assessment: Generalized weakness       Communication      Cognition Arousal/Alertness: Awake/alert Behavior During Therapy: WFL for tasks assessed/performed Overall Cognitive Status: Within Functional Limits for tasks assessed                                                Assessment/Plan    PT Assessment Patient needs continued PT services  PT Problem List Decreased range of motion;Decreased activity tolerance;Decreased balance;Decreased strength;Decreased mobility       PT Treatment Interventions Therapeutic exercise;Therapeutic activities;Functional mobility training;Gait training    PT Goals (Current goals can be found in the Care Plan section)       Frequency Min 4X/week   Barriers to discharge    mobility                    Help needed turning from your back to your side while in a flat bed without using bedrails?: A Little Help needed moving from lying on your back to sitting on the side of a flat bed without using bedrails?: A Lot Help needed moving to and from a bed to a chair (including a wheelchair)?: A Little Help needed standing up from a chair using your arms (e.g., wheelchair or  bedside chair)?: A Lot Help needed to walk in hospital room?: Total Help needed climbing 3-5 steps with a railing? : Total 6 Click Score: 12     Equipment Utilized During Treatment: Gait belt Activity Tolerance: Patient limited by fatigue Patient left: with call bell/phone within reach;in bed   PT Visit Diagnosis: Unsteadiness on feet (R26.81);Muscle weakness (generalized) (M62.81);Difficulty in walking, not elsewhere classified (R26.2)    Time: 8367-2550 PT Time Calculation (min) (ACUTE ONLY): 22 min   Charges:   PT Evaluation $PT Eval Low Complexity: [place], PT CLT [phone]  05/10/2021, 1:45 PM

## 2021-05-10 NOTE — Evaluation (Signed)
Clinical/Bedside Swallow Evaluation Patient Details  Name: [name] MRN: [id number] Date of Birth: 1941/05/31  Today's Date: 05/10/2021 Time: SLP Start Time (ACUTE ONLY): 1758 SLP Stop Time (ACUTE ONLY): 1819 SLP Time Calculation (min) (ACUTE ONLY): 21 min  Past Medical History:  Past Medical History:  Diagnosis Date   Anxiety    Arthritis    Bronchitis    Bronchitis    CKD (chronic kidney disease) stage 3, GFR 30-59 ml/min ([place]) 5/42/7062   Complication of anesthesia    pt states she had a cardiac arrest during hysterectomy 1985.  Pt had several surgies since then that were uneventful   COPD (chronic obstructive pulmonary disease) ([place])    Depression    Dysuria 03/27/2019   Elevated LFTs 05/04/2016   History of TIAs 1981   left side weakness   HIV (human immunodeficiency virus infection) ([place])    Hypertension    Kidney disease related to HIV infection [facility])    pt states she has to have her creatinine level checked often    Moderate single current episode of major depressive disorder ([place]) 03/12/2021   Nausea 03/03/2017   Peripheral neuropathy    bilateral feet   Seasonal allergies 03/27/2019   Sinusitis 05/06/2018   UTI (urinary tract infection) 03/03/2017   Weight loss 03/03/2017   Past Surgical History:  Past Surgical History:  Procedure Laterality Date   ABDOMINAL HYSTERECTOMY  1985   BACK SURGERY     BIOPSY  07/15/2020   Procedure: BIOPSY;  Surgeon: Daneil Dolin, MD;  [facility];  Service: Endoscopy;;   CATARACT EXTRACTION, BILATERAL     CHOLECYSTECTOMY     COLONOSCOPY  08/10/2002   NUR: Normal colonoscopy except for some hemorrhoids and some erythema at the dentate line   COLONOSCOPY  12/10/2006   Edwards:Diffuse colitis in the transverse and descending colon/This is not entirely typical of ischemic colitis or her HIV positive/ disease.  We need to be concerned about other causes   COLONOSCOPY  01/19/2011   RMR: Internal and external hemorrhoids,  likely source of hematochezia anal papilla, otherwise normal rectal mucosa/ Left-sided diverticula.  Cecal polyp with status post cold snare polypectomy.  Remainder of colonic mucosa appeared normal. Pathology with tubular adenoma. Repeat in 2017   COLONOSCOPY WITH PROPOFOL N/A 07/15/2020   Procedure: COLONOSCOPY WITH PROPOFOL;  Surgeon: Daneil Dolin, MD;  Location: [facility];  Service: Endoscopy;  Laterality: N/A;  8:15AM TCS via Ostomy   COLOSTOMY N/A 11/12/2018   Procedure: COLOSTOMY;  Surgeon: Virl Cagey, MD;  Location: [facility] ORS;  Service: General;  Laterality: N/A;   COLOSTOMY REVERSAL N/A 10/02/2020   Procedure: COLOSTOMY REVERSAL;  Surgeon: Virl Cagey, MD;  Location: [facility] ORS;  Service: General;  Laterality: N/A;   ESOPHAGOGASTRODUODENOSCOPY  08/10/2002     NUR: Mild changes of reflux esophagitis limited to gastroesophageal junction  No evidence of ring, stricture, or esophageal candidiasis dysphagia/ Gastritis, possibly H. pylori induced   ESOPHAGOGASTRODUODENOSCOPY  12/25/2002   NUR: Erosive antral gastritis.  The degree of gastritis is more significant than her last exam/ Normal examination of the esophagus/ Esophageal dilatation performed by passing 37 and [address] [place]   ESOPHAGOGASTRODUODENOSCOPY  11/13/2010   RMR: Circumferential distal esophageal erosions with soft peptic stricture, consistent with erosive reflux esophagitis or stricture  formation, status post dilation as described above/  Small hiatal hernia/ Tiny antral erosions, otherwise normal stomach D1 and D2   ESOPHAGOGASTRODUODENOSCOPY (EGD) WITH PROPOFOL N/A  07/15/2020   Procedure: ESOPHAGOGASTRODUODENOSCOPY (EGD) WITH PROPOFOL;  Surgeon: Daneil Dolin, MD;  [facility];  Service: Endoscopy;  Laterality: N/A;   FLEXIBLE SIGMOIDOSCOPY N/A 07/15/2020   Procedure: FLEXIBLE SIGMOIDOSCOPY;  Surgeon: Daneil Dolin, MD;  [facility];  Service: Endoscopy;  Laterality: N/A;   HEMORRHOID  SURGERY  09/02/2012   Procedure: HEMORRHOIDECTOMY;  Surgeon: Jamesetta So, MD;  Location: [facility];  Service: General;  Laterality: N/A;   LAPAROTOMY N/A 11/12/2018   Procedure: EXPLORATORY LAPAROTOMY;  Surgeon: Virl Cagey, MD;  Location: [facility];  Service: General;  Laterality: N/A;   MALONEY DILATION N/A 07/15/2020   Procedure: Keturah Shavers;  Surgeon: Daneil Dolin, MD;  [facility];  Service: Endoscopy;  Laterality: N/A;   PARTIAL COLECTOMY N/A 11/12/2018   Procedure: PARTIAL COLECTOMY;  Surgeon: Virl Cagey, MD;  Location: [facility] OR[facility];  Service: General;  Laterality: N/A;   POLYPECTOMY  07/15/2020   Procedure: POLYPECTOMY;  Surgeon: Daneil Dolin, MD;  [facility];  Service: Endoscopy;;   TUBAL LIGATION     HPI:  [name] is an 80 y.o. adult with medical history significant for peripheral neuropathy, DDD, depression, COPD, CKD stage IIIB, hypertension, HIV infection presents to the emergency department due to 4-week onset of cough, sore throat, subjective fever, intermittent diaphoresis and generalized weakness to the extent that patient has difficulty in being able to ambulate, she states that she has been mostly bedbound/recliner bound since onset of symptoms about 4 weeks ago.  Patient states that she was given 2 different antibiotics, but she was unable to remember the names.  She endorsed occasional choking on food/drink.  Patient states that her PCP was trying to place at [facility] for rehab so as to get her strength back.  Patient lives with a roommate. BSE requested.   Assessment / Plan / Recommendation Clinical Impression  Clinical swallow evaluation completed at bedside. Pt stated that she needs softer foods because her dentures are at home (regular textures on her tray). She also said she had a swallow study in [place] a few years ago. Pt with slight right facial asymmetry and reduced velar elevation on the right, which she says is from a  previous stroke. Pt assessed with thin water, puree, and mech soft textures. Pt without overt signs or symptoms of aspiration, however does present with prolonged oral prep and impaired mastication due to sparse dentition. Recommend D3/mech soft and thin liquids with standard aspiration and reflux precautions. SLP will follow during acute stay for diet tolerance. SLP Visit Diagnosis: Dysphagia, unspecified (R13.10)    Aspiration Risk  Mild aspiration risk    Diet Recommendation Dysphagia 3 (Mech soft);Thin liquid   Liquid Administration via: Cup;Straw Medication Administration: Whole meds with liquid Supervision: Patient able to self feed Compensations: Slow rate;Small sips/bites Postural Changes: Seated upright at 90 degrees;Remain upright for at least 30 minutes after po intake    Other  Recommendations Oral Care Recommendations: Oral care BID Other Recommendations: Clarify dietary restrictions   Follow up Recommendations None      Frequency and Duration min 2x/week  1 week       Prognosis Prognosis for Safe Diet Advancement: Good      Swallow Study   General Date of Onset: 05/10/21 HPI: [name] is an 80 y.o. adult with medical history significant for peripheral neuropathy, DDD, depression, COPD, CKD stage IIIB, hypertension, HIV infection presents to the emergency  department due to 4-week onset of cough, sore throat, subjective fever, intermittent diaphoresis and generalized weakness to the extent that patient has difficulty in being able to ambulate, she states that she has been mostly bedbound/recliner bound since onset of symptoms about 4 weeks ago.  Patient states that she was given 2 different antibiotics, but she was unable to remember the names.  She endorsed occasional choking on food/drink.  Patient states that her PCP was trying to place at [facility] for rehab so as to get her strength back.  Patient lives with a roommate. BSE requested. Type of Study: Bedside  Swallow Evaluation Previous Swallow Assessment:  (Pt reports she had a swallow study in [place] a few years ago) Diet Prior to this Study: Regular;Thin liquids Temperature Spikes Noted: No Respiratory Status: Room air History of Recent Intubation: No Behavior/Cognition: Alert;Cooperative;Pleasant mood Oral Cavity Assessment: Within Functional Limits Oral Care Completed by SLP: Recent completion by staff Oral Cavity - Dentition: Missing dentition (Pt has dentures at home) Vision: Functional for self-feeding Self-Feeding Abilities: Able to feed self Patient Positioning: Upright in bed Baseline Vocal Quality: Normal;Low vocal intensity Volitional Cough: Strong;Congested Volitional Swallow: Able to elicit    Oral/Motor/Sensory Function Overall Oral Motor/Sensory Function: Mild impairment Facial ROM: Reduced right Lingual ROM: Within Functional Limits Lingual Symmetry: Within Functional Limits Lingual Strength: Within Functional Limits Velum: Impaired right Mandible: Within Functional Limits   Ice Chips Ice chips: Within functional limits Presentation: Spoon   Thin Liquid Thin Liquid: Within functional limits Presentation: Cup;Self Fed;Straw    Nectar Thick Nectar Thick Liquid: Not tested   Honey Thick Honey Thick Liquid: Not tested   Puree Puree: Within functional limits Presentation: Spoon   Solid     Solid: Impaired Presentation: Spoon Oral Phase Impairments: Impaired mastication (poor dentition) Oral Phase Functional Implications: Prolonged oral transit;Impaired mastication     Thank you,  Genene Churn, [place]  Arash Karstens 05/10/2021,6:21 PM

## 2021-05-10 NOTE — Plan of Care (Signed)
  Problem: Acute Rehab PT Goals(only PT should resolve) Goal: Pt Will Go Supine/Side To Sit Flowsheets (Taken 05/10/2021 1343) Pt will go Supine/Side to Sit: with minimal assist Goal: Pt Will Transfer Bed To Chair/Chair To Bed Flowsheets (Taken 05/10/2021 1343) Pt will Transfer Bed to Chair/Chair to Bed: with mod assist Goal: Pt Will Ambulate Flowsheets (Taken 05/10/2021 1343) Pt will Ambulate:  10 feet  with moderate assist  with rolling walker

## 2021-05-10 NOTE — Progress Notes (Signed)
PROGRESS NOTE    [name]  WUX:[id number] DOB: 1940/10/30 DOA: 05/09/2021 PCP: Wannetta Sender, FNP     Brief Narrative:  [name] is an 80 y.o. adult with medical history significant for peripheral neuropathy, DDD, depression, COPD, CKD stage IIIB, hypertension, HIV infection presents to the emergency department due to 4-week onset of cough, sore throat, subjective fever, intermittent diaphoresis and generalized weakness to the extent that patient has difficulty in being able to ambulate, she states that she has been mostly bedbound/recliner bound since onset of symptoms about 4 weeks ago.  Patient states that she was given 2 different antibiotics, but she was unable to remember the names.  She endorsed occasional choking on food/drink.  Patient states that her PCP was trying to place at [facility] for rehab so as to get her strength back.  Patient lives with a roommate.  New events last 24 hours / Subjective: Complains of productive cough of yellow sputum.  Intermittent fevers at home.  Continues to be very weak overall.  States that she lives with a roommate that she has known for over 20 years.   Assessment & Plan:   Principal Problem:   CAP (community acquired pneumonia) Active Problems:   HIV disease ([place])   Esophageal dysphagia   Essential hypertension   CKD (chronic kidney disease) stage 3, GFR 30-59 ml/min ([facility])   COPD (chronic obstructive pulmonary disease) ([facility])   Generalized weakness   Elevated MCV   Community-acquired pneumonia -Failed outpatient treatment -CT chest with suggestion of bilateral upper lobe infiltrates, multifocal pneumonia -Continue ceftriaxone, azithromycin, flagyl  -Sputum culture pending  Generalized weakness, frailty -PT OT evaluation  Dysphagia -SLP evaluation  Hypertension -Continue losartan, Bystolic  CKD stage IIIb -Baseline creatinine 1.2-1.7 -Stable  HIV -Followed by Dr. [name] -Continue Triumeq    History of schizophrenia -Has had visual and auditory hallucinations earlier this year, went to [facility] geripsych March 2022   DVT prophylaxis:  heparin injection 5,000 Units Start: 05/10/21 0600 SCDs Start: 05/09/21 2352  Code Status:     Code Status Orders  (From admission, onward)           Start     Ordered   05/09/21 2352  Full code  Continuous        05/09/21 2352           Code Status History     Date Active Date Inactive Code Status Order ID Comments User Context   12/23/2020 1653 12/27/2020 1359 Full Code [id number]  Lorayne Bender PA-C ED   10/02/2020 1641 10/08/2020 2152 Full Code [id number]  Virl Cagey, MD Inpatient   12/30/2019 0050 12/31/2019 1743 Full Code [id number]  Oswald Hillock, MD Inpatient   11/12/2018 1941 11/21/2018 1921 Full Code [id number]  Virl Cagey, MD Inpatient   12/17/2017 0617 12/19/2017 1924 Full Code [id number]  Vianne Bulls, MD ED   03/30/2017 0209 03/30/2017 1802 Full Code [id number]  Oswald Hillock, MD Inpatient   07/22/2016 1601 07/23/2016 1738 Full Code [id number]  Samuella Cota, MD Inpatient   05/31/2014 1714 06/02/2014 2142 Full Code [id number]  Radene Gunning, NP Inpatient      Advance Directive Documentation    Flowsheet Row Most Recent Value  Type of Advance Directive Living will  Pre-existing out of facility DNR order (yellow form or pink MOST form) --  "MOST" Form in Place? --      Family Communication: None at bedside Disposition  Plan:  Status is: Inpatient  Remains inpatient appropriate because:IV treatments appropriate due to intensity of illness or inability to take PO  Dispo: The patient is from: Home              Anticipated d/c is to:  TBD              Patient currently is not medically stable to d/c.   Difficult to place patient No    Antimicrobials:  Anti-infectives (From admission, onward)    Start     Dose/Rate Route Frequency Ordered Stop   05/10/21 1000  abacavir-dolutegravir-lamiVUDine  (TRIUMEQ) 600-50-300 MG per tablet 1 tablet        1 tablet Oral Daily 05/10/21 0439     05/10/21 0900  cefTRIAXone (ROCEPHIN) 1 g in sodium chloride 0.9 % 100 mL IVPB        1 g 200 mL/hr over 30 Minutes Intravenous Every 24 hours 05/10/21 0228 05/16/21 0859   05/10/21 0900  azithromycin (ZITHROMAX) 500 mg in sodium chloride 0.9 % 250 mL IVPB        500 mg 250 mL/hr over 60 Minutes Intravenous Every 24 hours 05/10/21 0228 05/14/21 0859   05/09/21 2215  azithromycin (ZITHROMAX) 500 mg in sodium chloride 0.9 % 250 mL IVPB        500 mg 250 mL/hr over 60 Minutes Intravenous  Once 05/09/21 2210 05/10/21 0102   05/09/21 2145  cefTRIAXone (ROCEPHIN) 1 g in sodium chloride 0.9 % 100 mL IVPB        1 g 200 mL/hr over 30 Minutes Intravenous  Once 05/09/21 2143 05/09/21 2306   05/09/21 2145  metroNIDAZOLE (FLAGYL) IVPB 500 mg        500 mg 100 mL/hr over 60 Minutes Intravenous Every 8 hours 05/09/21 2143          Objective: Vitals:   05/09/21 2352 05/09/21 2352 05/10/21 0100 05/10/21 0413  BP:  (!) 175/86  (!) 172/88  Pulse:  62  63  Resp:  20  20  Temp:  98.3 F (36.8 C)  97.7 F (36.5 C)  TempSrc:  Oral  Oral  SpO2: 98% 98%  100%  Weight:   74.7 kg   Height:   5\' 2"  (1.575 m)     Intake/Output Summary (Last 24 hours) at 05/10/2021 1129 Last data filed at 05/10/2021 0600 Gross per 24 hour  Intake 93.67 ml  Output --  Net 93.67 ml   Filed Weights   05/10/21 0100  Weight: 74.7 kg    Examination:  General exam: Appears calm and comfortable  Respiratory system: Mild rhonchi. Respiratory effort normal. No respiratory distress. No conversational dyspnea.  Remains on room air Cardiovascular system: S1 & S2 heard, RRR. No murmurs. No pedal edema. Gastrointestinal system: Abdomen is nondistended, soft and nontender. Normal bowel sounds heard. Central nervous system: Alert and oriented. No focal neurological deficits.  Extremities: Symmetric in appearance  Skin: No rashes, lesions  or ulcers on exposed skin  Psychiatry: Mood & affect appropriate.   Data Reviewed: I have personally reviewed following labs and imaging studies  CBC: Recent Labs  Lab 05/09/21 1633 05/10/21 0340  WBC 7.2 9.4  NEUTROABS 3.7  --   HGB 14.9 14.2  HCT 42.8 41.2  MCV 105.2* 104.0*  PLT 237 616   Basic Metabolic Panel: Recent Labs  Lab 05/09/21 1633 05/10/21 0340  NA 137 138  K 4.5 4.4  CL 104 106  CO2 27 25  GLUCOSE 101* 103*  BUN 27* 23  CREATININE 1.28* 1.17*  CALCIUM 10.4* 10.1  MG  --  2.0  PHOS  --  2.9   GFR: Estimated Creatinine Clearance (by C-G formula based on SCr of 1.17 mg/dL (H)) Female: 36.3 mL/min (A) Female: 44.6 mL/min (A) Liver Function Tests: Recent Labs  Lab 05/09/21 1633 05/10/21 0340  AST 19 20  ALT 19 18  ALKPHOS 97 88  BILITOT 0.6 0.8  PROT 7.5 6.9  ALBUMIN 4.5 4.0   No results for input(s): LIPASE, AMYLASE in the last 168 hours. No results for input(s): AMMONIA in the last 168 hours. Coagulation Profile: Recent Labs  Lab 05/10/21 0340  INR 1.1   Cardiac Enzymes: No results for input(s): CKTOTAL, CKMB, CKMBINDEX, TROPONINI in the last 168 hours. BNP (last 3 results) No results for input(s): PROBNP in the last 8760 hours. HbA1C: No results for input(s): HGBA1C in the last 72 hours. CBG: No results for input(s): GLUCAP in the last 168 hours. Lipid Profile: No results for input(s): CHOL, HDL, LDLCALC, TRIG, CHOLHDL, LDLDIRECT in the last 72 hours. Thyroid Function Tests: No results for input(s): TSH, T4TOTAL, FREET4, T3FREE, THYROIDAB in the last 72 hours. Anemia Panel: Recent Labs    05/10/21 0341  VITAMINB12 729  FOLATE 15.1   Sepsis Labs: Recent Labs  Lab 05/10/21 0340  PROCALCITON <0.10    Recent Results (from the past 240 hour(s))  Resp Panel by RT-PCR (Flu A&B, Covid) Nasopharyngeal Swab     Status: None   Collection Time: 05/09/21 10:00 PM   Specimen: Nasopharyngeal Swab; Nasopharyngeal(NP) swabs in vial  transport medium  Result Value Ref Range Status   SARS Coronavirus 2 by RT PCR NEGATIVE NEGATIVE Final    Comment: (NOTE) SARS-CoV-2 target nucleic acids are NOT DETECTED.  The SARS-CoV-2 RNA is generally detectable in upper respiratory specimens during the acute phase of infection. The lowest concentration of SARS-CoV-2 viral copies this assay can detect is 138 copies/mL. A negative result does not preclude SARS-Cov-2 infection and should not be used as the sole basis for treatment or other patient management decisions. A negative result may occur with  improper specimen collection/handling, submission of specimen other than nasopharyngeal swab, presence of viral mutation(s) within the areas targeted by this assay, and inadequate number of viral copies(<138 copies/mL). A negative result must be combined with clinical observations, patient history, and epidemiological information. The expected result is Negative.  Fact Sheet for Patients:  EntrepreneurPulse.com.au  Fact Sheet for Healthcare Providers:  IncredibleEmployment.be  This test is no t yet approved or cleared by the [place] FDA and  has been authorized for detection and/or diagnosis of SARS-CoV-2 by FDA under an Emergency Use Authorization (EUA). This EUA will remain  in effect (meaning this test can be used) for the duration of the COVID-19 declaration under Section 564(b)(1) of the Act, 21 U.S.C.section 360bbb-3(b)(1), unless the authorization is terminated  or revoked sooner.       Influenza A by PCR NEGATIVE NEGATIVE Final   Influenza B by PCR NEGATIVE NEGATIVE Final    Comment: (NOTE) The Xpert Xpress SARS-CoV-2/FLU/RSV plus assay is intended as an aid in the diagnosis of influenza from Nasopharyngeal swab specimens and should not be used as a sole basis for treatment. Nasal washings and aspirates are unacceptable for Xpert Xpress SARS-CoV-2/FLU/RSV testing.  Fact  Sheet for Patients: EntrepreneurPulse.com.au  Fact Sheet for Healthcare Providers: IncredibleEmployment.be  This test is not yet approved or cleared by the [place]  States FDA and has been authorized for detection and/or diagnosis of SARS-CoV-2 by FDA under an Emergency Use Authorization (EUA). This EUA will remain in effect (meaning this test can be used) for the duration of the COVID-19 declaration under Section 564(b)(1) of the Act, 21 U.S.C. section 360bbb-3(b)(1), unless the authorization is terminated or revoked.  Performed at [facility], [address] [place], [place] [address]       Radiology Studies: DG Chest 2 View  Result Date: 05/09/2021 CLINICAL DATA:  Cough EXAM: CHEST - 2 VIEW COMPARISON:  12/26/2020, 12/23/2020 FINDINGS: A rounded density has developed within the right hilar region possibly representing vascular shadow, hilar adenopathy, or a perihilar pulmonary nodule. The lungs are otherwise clear. No pneumothorax or pleural effusion. Cardiac size within normal limits. Pulmonary vascularity is normal. No acute bone abnormality. IMPRESSION: No radiographic evidence of acute cardiopulmonary disease. Interval development of a rounded opacity within the right hilar region. See differential considerations above. This could be better assessed with dedicated contrast enhanced CT examination of the chest. Alternatively, follow-up chest radiograph in 4-6 weeks may helpful in documenting resolution. Electronically Signed   By: Fidela Salisbury MD   On: 05/09/2021 17:50   CT Chest W Contrast  Result Date: 05/09/2021 CLINICAL DATA:  Weakness and cough for 3 weeks, wheezing, abnormal chest x-ray at right hilum EXAM: CT CHEST WITH CONTRAST TECHNIQUE: Multidetector CT imaging of the chest was performed during intravenous contrast administration. CONTRAST:  72mL OMNIPAQUE IOHEXOL 300 MG/ML  SOLN COMPARISON:  05/09/2021 FINDINGS: Cardiovascular: The heart  is unremarkable without pericardial effusion. Prominent atherosclerosis of the distal aortic arch and descending thoracic aorta. No evidence of aneurysm or dissection. Mediastinum/Nodes: No enlarged mediastinal, hilar, or axillary lymph nodes. Thyroid gland, trachea, and esophagus demonstrate no significant findings. Lungs/Pleura: There are patchy bilateral areas of ground-glass airspace disease most pronounced within the suprahilar regions, which could reflect early edema or atypical viral infection such as COVID-19. No effusion or pneumothorax. The central airways are patent. Upper Abdomen: No acute abnormality. Musculoskeletal: No acute or destructive bony lesions. Reconstructed images demonstrate no additional findings. IMPRESSION: 1. Patchy upper lobe predominant ground-glass airspace disease consistent with multifocal infection or early edema. 2. No abnormality at the right hilum to correspond to the chest x-ray finding. The rounded density at the right hilum on chest x-ray likely reflects a pulmonary vessel seen on end or clothing artifact. 3.  Aortic Atherosclerosis (ICD10-I70.0). Electronically Signed   By: Randa Ngo M.D.   On: 05/09/2021 21:08      Scheduled Meds:  abacavir-dolutegravir-lamiVUDine  1 tablet Oral Daily   dextromethorphan-guaiFENesin  1 tablet Oral BID   feeding supplement  237 mL Oral BID BM   heparin  5,000 Units Subcutaneous Q8H   losartan  50 mg Oral Daily   melatonin  6 mg Oral Once   nebivolol  5 mg Oral Daily   pantoprazole  40 mg Oral QAC breakfast   Continuous Infusions:  azithromycin 500 mg (05/10/21 0900)   cefTRIAXone (ROCEPHIN)  IV 1 g (05/10/21 0901)   metronidazole 500 mg (05/10/21 0547)     LOS: 1 day      Time spent: 25 minutes   Dessa Phi, DO [facility] 05/10/2021, 11:29 AM   Available via Epic secure chat 7am-7pm After these hours, please refer to coverage provider listed on [email or web address]

## 2021-05-10 NOTE — Plan of Care (Signed)
Pt admitted for PNA, A&Ox4. BP (!) 175/86 (BP Location: Right Arm)   Pulse 62   Temp 98.3 F (36.8 C) (Oral)   Resp 20   SpO2 98%  No c/o pain. Pt requesting sleep medicine. Inquired with Dr. Josephine Cables, waiting orders. Pt oriented to room. Call bell near by, bed in lowest position. Pt not on tele/iv fluids at this time. No c/o pain or needs voiced at this time. No skin issues. Pt on purewick due to incontenece.  Milta Deiters J.12:44 AM 05/10/21

## 2021-05-11 DIAGNOSIS — J189 Pneumonia, unspecified organism: Secondary | ICD-10-CM | POA: Diagnosis not present

## 2021-05-11 LAB — CBC
HCT: 45.4 % (ref 36.0–46.0)
Hemoglobin: 15.5 g/dL — ABNORMAL HIGH (ref 12.0–15.0)
MCH: 35.6 pg — ABNORMAL HIGH (ref 26.0–34.0)
MCHC: 34.1 g/dL (ref 30.0–36.0)
MCV: 104.1 fL — ABNORMAL HIGH (ref 80.0–100.0)
Platelets: 150 10*3/uL (ref 150–400)
RBC: 4.36 MIL/uL (ref 3.87–5.11)
RDW: 13.3 % (ref 11.5–15.5)
WBC: 6.6 10*3/uL (ref 4.0–10.5)
nRBC: 0 % (ref 0.0–0.2)

## 2021-05-11 LAB — BASIC METABOLIC PANEL
Anion gap: 8 (ref 5–15)
BUN: 19 mg/dL (ref 8–23)
CO2: 26 mmol/L (ref 22–32)
Calcium: 10.5 mg/dL — ABNORMAL HIGH (ref 8.9–10.3)
Chloride: 106 mmol/L (ref 98–111)
Creatinine, Ser: 1.15 mg/dL — ABNORMAL HIGH (ref 0.44–1.00)
GFR, Estimated: 48 mL/min — ABNORMAL LOW (ref >60)
Glucose, Bld: 96 mg/dL (ref 70–99)
Potassium: 4.4 mmol/L (ref 3.5–5.1)
Sodium: 140 mmol/L (ref 135–145)

## 2021-05-11 MED ORDER — HALOPERIDOL 5 MG PO TABS
10.0000 mg | ORAL_TABLET | Freq: Every day | ORAL | Status: DC
  Administered 2021-05-11: 10 mg via ORAL
  Filled 2021-05-11: qty 2

## 2021-05-11 MED ORDER — DULOXETINE HCL 60 MG PO CPEP
60.0000 mg | ORAL_CAPSULE | Freq: Every day | ORAL | Status: DC
  Administered 2021-05-11 – 2021-05-17 (×7): 60 mg via ORAL
  Filled 2021-05-11 (×8): qty 1

## 2021-05-11 MED ORDER — THIAMINE HCL 100 MG PO TABS
100.0000 mg | ORAL_TABLET | Freq: Every day | ORAL | Status: DC
  Administered 2021-05-11 – 2021-05-17 (×7): 100 mg via ORAL
  Filled 2021-05-11 (×6): qty 1

## 2021-05-11 MED ORDER — ACETAMINOPHEN 325 MG PO TABS
650.0000 mg | ORAL_TABLET | Freq: Four times a day (QID) | ORAL | Status: DC | PRN
  Administered 2021-05-11 – 2021-05-13 (×3): 650 mg via ORAL
  Filled 2021-05-11 (×3): qty 2

## 2021-05-11 MED ORDER — CLONAZEPAM 0.5 MG PO TABS
1.0000 mg | ORAL_TABLET | Freq: Two times a day (BID) | ORAL | Status: DC | PRN
  Administered 2021-05-11 – 2021-05-15 (×2): 1 mg via ORAL
  Filled 2021-05-11 (×2): qty 2

## 2021-05-11 MED ORDER — PRAZOSIN HCL 1 MG PO CAPS
1.0000 mg | ORAL_CAPSULE | Freq: Every day | ORAL | Status: DC
  Administered 2021-05-12: 1 mg via ORAL
  Filled 2021-05-11 (×2): qty 1

## 2021-05-11 MED ORDER — ADULT MULTIVITAMIN W/MINERALS CH
1.0000 | ORAL_TABLET | Freq: Every day | ORAL | Status: DC
  Administered 2021-05-11 – 2021-05-17 (×7): 1 via ORAL
  Filled 2021-05-11 (×8): qty 1

## 2021-05-11 MED ORDER — LOSARTAN POTASSIUM 25 MG PO TABS: 12.5000 mg | ORAL_TABLET | Freq: Every day | ORAL | Status: DC

## 2021-05-11 MED ORDER — OXYCODONE HCL ER 10 MG PO T12A
10.0000 mg | EXTENDED_RELEASE_TABLET | Freq: Two times a day (BID) | ORAL | Status: DC
  Administered 2021-05-11 – 2021-05-17 (×13): 10 mg via ORAL
  Filled 2021-05-11 (×14): qty 1

## 2021-05-11 MED ORDER — CARVEDILOL 3.125 MG PO TABS: 6.2500 mg | ORAL_TABLET | Freq: Two times a day (BID) | ORAL | Status: DC

## 2021-05-11 NOTE — [place] FL2 (Signed)
[place] LEVEL OF CARE SCREENING TOOL     IDENTIFICATION  Patient Name: [name] Birthdate: April 09, 1941 Sex: adult Admission Date (Current Location): 05/09/2021  [facility] and [place] Number:  [employer] and Address:  [place] [address] [place]      Provider Number: [id number]  Attending Physician Name and Address:  Dessa Phi, DO  Relative Name and Phone Number:  Heloise Ochoa [facility])   [phone]    Current Level of Care: Hospital Recommended Level of Care: [place] Prior Approval Number:    Date Approved/Denied: 02/03/21 PASRR Number: [id number]  Discharge Plan:      Current Diagnoses: Patient Active Problem List   Diagnosis Date Noted   Generalized weakness 05/10/2021   Elevated MCV 05/10/2021   Moderate single current episode of major depressive disorder ([place]) 03/12/2021   Incontinence of urine in female 11/21/2020   Skin yeast infection 11/21/2020   Mass of left side of neck 09/12/2020   Colostomy in place [facility]) 09/12/2020   Housing problems 06/06/2020   Healthcare maintenance 06/06/2020   Abdominal wall bulge 04/22/2020   Acute renal failure superimposed on stage 3b chronic kidney disease ([place])    History of colonic polyps 07/06/2019   History of partial colectomy 07/06/2019   Seasonal allergies 03/27/2019   Dysuria 03/27/2019   Obesity (BMI 30-39.9) 11/12/2018   Constipation due to opioid therapy 11/12/2018   Lactic acidosis 11/12/2018   Large bowel perforation ([place]) 11/12/2018   Peritonitis ([place]) 11/12/2018   Stercoral ulcer of large intestine 11/12/2018   Pneumoperitoneum    COPD (chronic obstructive pulmonary disease) ([place]) 12/17/2017   Hypothyroidism 12/17/2017   Steroid-induced psychosis, with hallucinations ([place])    UTI (urinary tract infection) 03/03/2017   Nausea 03/03/2017   Weight loss 03/03/2017   CAP (community acquired pneumonia) 07/22/2016   CKD  (chronic kidney disease) stage 3, GFR 30-59 ml/min (HCC) 05/04/2016   Elevated LFTs 05/04/2016   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   Breath shortness 06/19/2014   Syncope 05/31/2014   Rib fracture 05/31/2014   Weight gain 03/21/2014   Cervical spondylosis 08/15/2013   Neck pain on left side 05/24/2013   Unspecified vitamin D deficiency 12/14/2012   Vitamin D toxicity 12/14/2012   Hemorrhoid 09/01/2012   Myalgia 02/25/2012   Essential hypertension 11/25/2011   Cramps, muscle, general 08/18/2011   RECTAL BLEEDING 11/03/2010   Esophageal dysphagia 11/03/2010   FATIGUE 08/29/2010   MUSCLE PAIN 06/04/2010   Cystitis 05/12/2010   RENAL INSUFFICIENCY 12/18/2008   DEPRESSION 09/04/2008   IRRITABLE BOWEL SYNDROME 05/22/2008   HERNIATED CERVICAL DISC 10/05/2007   HERNIATED LUMBAR DISC 10/05/2007   History of urinary tract infection 09/20/2007   HIV-1 associated autonomic neuropathy ([place]) 03/16/2007   ELBOW PAIN 03/16/2007   ISCHEMIC COLITIS 12/27/2006   RENAL FAILURE NOS 12/27/2006   HIV disease ([place]) 08/27/2006    Orientation RESPIRATION BLADDER Height & Weight     Self, Time, Situation, Place  Normal Incontinent Weight: 156 lb 1.4 oz (70.8 kg) Height:  5\' 2"  (157.5 cm)  BEHAVIORAL SYMPTOMS/MOOD NEUROLOGICAL BOWEL NUTRITION STATUS      Incontinent Diet (DIET DYS 3 Room service appropriate? Yes; Fluid consistency: Thin)  AMBULATORY STATUS COMMUNICATION OF NEEDS Skin   Extensive Assist Verbally Normal                       Personal Care Assistance Level of Assistance  Bathing, Feeding, Dressing Bathing  Assistance: Maximum assistance Feeding assistance: Independent Dressing Assistance: Maximum assistance     Functional Limitations Info  Sight, Hearing, Speech Sight Info: Adequate Hearing Info: Adequate Speech Info: Adequate    SPECIAL CARE FACTORS FREQUENCY  PT (By licensed PT)     PT Frequency: 5x              Contractures Contractures Info: Not present     Additional Factors Info  Code Status, Allergies, Psychotropic Code Status Info: Full Allergies Info: Bee Venom  Promethazine  Tenofovir Disoproxil Fumarate (tenofovir Disoproxil Fumarate)  Compazine  (prochlorperazine Edisylate)  Haloperidol Lactate  Sulfa Antibiotics  Brompheniramine-acetaminophen  Chlorcyclizine  Chlorpromazine  Codeine  Navane (thiothixene)  Prednisone  Promethazine Hcl  Propoxyphene N-acetaminophen  Morphine Psychotropic Info: Clonazepam (Klonopin), Duloxetine (Cymbalta), Haloperidol (Haldol), LORazepam, ondansetron,         Current Medications (05/11/2021):  This is the current hospital active medication list Current Facility-Administered Medications  Medication Dose Route Frequency Provider Last Rate Last Admin   abacavir-dolutegravir-lamiVUDine (TRIUMEQ) 600-50-300 MG per tablet 1 tablet  1 tablet Oral Daily Adefeso, Oladapo, DO   1 tablet at 05/11/21 0906   acetaminophen (TYLENOL) tablet 650 mg  650 mg Oral Q6H PRN Dessa Phi, DO   650 mg at 05/11/21 1002   albuterol (PROVENTIL) (2.5 MG/3ML) 0.083% nebulizer solution 2.5 mg  2.5 mg Inhalation Q6H PRN Adefeso, Oladapo, DO       azithromycin (ZITHROMAX) 500 mg in sodium chloride 0.9 % 250 mL IVPB  500 mg Intravenous Q24H Adefeso, Oladapo, DO 250 mL/hr at 05/11/21 0909 500 mg at 05/11/21 0909   cefTRIAXone (ROCEPHIN) 1 g in sodium chloride 0.9 % 100 mL IVPB  1 g Intravenous Q24H Adefeso, Oladapo, DO 200 mL/hr at 05/11/21 0907 1 g at 05/11/21 0907   clonazePAM (KLONOPIN) tablet 1 mg  1 mg Oral BID PRN Dessa Phi, DO   1 mg at 05/11/21 1337   dextromethorphan-guaiFENesin (MUCINEX DM) 30-600 MG per 12 hr tablet 1 tablet  1 tablet Oral BID Adefeso, Oladapo, DO   1 tablet at 05/11/21 0906   DULoxetine (CYMBALTA) DR capsule 60 mg  60 mg Oral Daily Dessa Phi, DO   60 mg at 05/11/21 1337   feeding supplement (ENSURE ENLIVE / ENSURE PLUS) liquid 237 mL  237 mL Oral BID BM Adefeso, Oladapo, DO   237 mL at 05/11/21  1338   haloperidol (HALDOL) tablet 10 mg  10 mg Oral QHS Dessa Phi, DO       heparin injection 5,000 Units  5,000 Units Subcutaneous Q8H Adefeso, Oladapo, DO   5,000 Units at 05/11/21 1304   losartan (COZAAR) tablet 50 mg  50 mg Oral Daily Adefeso, Oladapo, DO   50 mg at 05/11/21 0906   metroNIDAZOLE (FLAGYL) IVPB 500 mg  500 mg Intravenous Q8H Adefeso, Oladapo, DO 100 mL/hr at 05/11/21 1304 500 mg at 05/11/21 1304   multivitamin with minerals tablet 1 tablet  1 tablet Oral Daily Dessa Phi, DO   1 tablet at 05/11/21 1337   nebivolol (BYSTOLIC) tablet 5 mg  5 mg Oral Daily Adefeso, Oladapo, DO   5 mg at 05/11/21 0905   oxyCODONE (OXYCONTIN) 12 hr tablet 10 mg  10 mg Oral BID Dessa Phi, DO   10 mg at 05/11/21 1337   pantoprazole (PROTONIX) EC tablet 40 mg  40 mg Oral QAC breakfast Adefeso, Oladapo, DO   40 mg at 05/11/21 0906   prazosin (MINIPRESS) capsule 1 mg  1 mg  Oral QHS Dessa Phi, DO       thiamine tablet 100 mg  100 mg Oral Daily Dessa Phi, DO   100 mg at 05/11/21 1337     Discharge Medications: Please see discharge summary for a list of discharge medications.  Relevant Imaging Results:  Relevant Lab Results:   Additional Information Pt SSN: [id number]  Natasha Bence, LCSW

## 2021-05-11 NOTE — TOC Initial Note (Signed)
Transition of Care [facility]) - Initial/Assessment Note    Patient Details  Name: [name] MRN: [id number] Date of Birth: [birth year]  Transition [facility]) CM/SW Contact:    Natasha Bence, LCSW Phone Number: 05/11/2021, 3:22 PM  Clinical Narrative:                 Patient is a 80 year old female admitted for CAP (community acquired pneumonia). CSW observed patient's high readmission score and conducted readmission risk assessment. CSW also conducted intial assessment. Patient reported that she lives at home with her roommate and recieves help with her ADL's from her roommate and daughter in law. Patient reported at baseline she can ambulate with a cane and a walker. Patient has had 2 Covid 19 vaccines, but has not had a booster. CSW discussed PT's recommendation for SNF. Patient agreeable to local SNF referrals. CSW completed FL2 and faxed out patient. CSW also started [place]. TOC to follow.  Expected Discharge Plan: Skilled Nursing Facility Barriers to Discharge: Barriers Resolved   Patient Goals and CMS Choice Patient states their goals for this hospitalization and ongoing recovery are:: rehab with SNF CMS Medicare.gov Compare Post Acute Care list provided to:: Patient Choice offered to / list presented to : Patient  Expected Discharge Plan and Services Expected Discharge Plan: [place]                                              Prior Living Arrangements/Services   Lives with:: Self, Friends Patient language and need for interpreter reviewed:: Yes Do you feel safe going back to the place where you live?: Yes      Need for Family Participation in Patient Care: Yes (Comment) Care giver support system in place?: Yes (comment)   Criminal Activity/Legal Involvement Pertinent to Current Situation/Hospitalization: No - Comment as needed  Activities of Daily Living Home Assistive Devices/Equipment: Cane (specify quad or straight), Walker (specify  type), Bedside commode/3-in-1, Eyeglasses, Built-in shower seat ADL Screening (condition at time of admission) Patient's cognitive ability adequate to safely complete daily activities?: Yes Is the patient deaf or have difficulty hearing?: No Does the patient have difficulty seeing, even when wearing glasses/contacts?: Yes Does the patient have difficulty concentrating, remembering, or making decisions?: No Patient able to express need for assistance with ADLs?: Yes Does the patient have difficulty dressing or bathing?: Yes Independently performs ADLs?: No Communication: Independent Dressing (OT): Needs assistance Is this a change from baseline?: Pre-admission baseline Grooming: Needs assistance Is this a change from baseline?: Pre-admission baseline Feeding: Independent Bathing: Needs assistance Is this a change from baseline?: Pre-admission baseline Toileting: Needs assistance Is this a change from baseline?: Pre-admission baseline In/Out Bed: Independent Walks in Home: Independent Does the patient have difficulty walking or climbing stairs?: Yes Weakness of Legs: Both Weakness of Arms/Hands: Both  Permission Sought/Granted Permission sought to share information with : Family Supports Permission granted to share information with : Yes, Verbal Permission Granted     Permission granted to share info w AGENCY: Local SNFs        Emotional Assessment     Affect (typically observed): Accepting, Adaptable, Appropriate Orientation: : Oriented to Self, Oriented to Situation, Oriented to Place, Oriented to  Time Alcohol / Substance Use: Not Applicable Psych Involvement: No (comment)  Admission diagnosis:  CAP (community acquired pneumonia) [J18.9] Community acquired pneumonia,  unspecified laterality [J18.9] Patient Active Problem List   Diagnosis Date Noted   Generalized weakness 05/10/2021   Elevated MCV 05/10/2021   Moderate single current episode of major depressive disorder  ([place]) 03/12/2021   Incontinence of urine in female 11/21/2020   Skin yeast infection 11/21/2020   Mass of left side of neck 09/12/2020   Colostomy in place [facility]) 09/12/2020   Housing problems 06/06/2020   Healthcare maintenance 06/06/2020   Abdominal wall bulge 04/22/2020   Acute renal failure superimposed on stage 3b chronic kidney disease ([place])    History of colonic polyps 07/06/2019   History of partial colectomy 07/06/2019   Seasonal allergies 03/27/2019   Dysuria 03/27/2019   Obesity (BMI 30-39.9) 11/12/2018   Constipation due to opioid therapy 11/12/2018   Lactic acidosis 11/12/2018   Large bowel perforation ([place]) 11/12/2018   Peritonitis ([place]) 11/12/2018   Stercoral ulcer of large intestine 11/12/2018   Pneumoperitoneum    COPD (chronic obstructive pulmonary disease) ([place]) 12/17/2017   Hypothyroidism 12/17/2017   Steroid-induced psychosis, with hallucinations ([place])    UTI (urinary tract infection) 03/03/2017   Nausea 03/03/2017   Weight loss 03/03/2017   CAP (community acquired pneumonia) 07/22/2016   CKD (chronic kidney disease) stage 3, GFR 30-59 ml/min (HCC) 05/04/2016   Elevated LFTs 05/04/2016   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   Breath shortness 06/19/2014   Syncope 05/31/2014   Rib fracture 05/31/2014   Weight gain 03/21/2014   Cervical spondylosis 08/15/2013   Neck pain on left side 05/24/2013   Unspecified vitamin D deficiency 12/14/2012   Vitamin D toxicity 12/14/2012   Hemorrhoid 09/01/2012   Myalgia 02/25/2012   Essential hypertension 11/25/2011   Cramps, muscle, general 08/18/2011   RECTAL BLEEDING 11/03/2010   Esophageal dysphagia 11/03/2010   FATIGUE 08/29/2010   MUSCLE PAIN 06/04/2010   Cystitis 05/12/2010   RENAL INSUFFICIENCY 12/18/2008   DEPRESSION 09/04/2008   IRRITABLE BOWEL SYNDROME 05/22/2008   HERNIATED CERVICAL DISC 10/05/2007   HERNIATED LUMBAR DISC 10/05/2007   History of urinary tract infection 09/20/2007   HIV-1 associated  autonomic neuropathy ([place]) 03/16/2007   ELBOW PAIN 03/16/2007   ISCHEMIC COLITIS 12/27/2006   RENAL FAILURE NOS 12/27/2006   HIV disease ([place]) 08/27/2006   PCP:  Wannetta Sender, FNP Pharmacy:   [place], [address] [place] [address] Phone: [phone] Fax: [place] ([place]) - [place], [place] [address] Phone: [phone] Fax: [phone]     Social Determinants of Health (SDOH) Interventions    Readmission Risk Interventions Readmission Risk Prevention Plan 05/11/2021  Transportation Screening Complete  PCP or Specialist Appt within 3-5 Days Complete  HRI or Home Care Consult Complete  Social Work Consult for [place] Planning/Counseling Complete  Palliative Care Screening Not Applicable  Medication Review [employer]) Complete  Some recent data might be hidden

## 2021-05-11 NOTE — Progress Notes (Signed)
PROGRESS NOTE    [name]  YTK:[id number] DOB: 1941-08-12 DOA: 05/09/2021 PCP: Wannetta Sender, FNP     Brief Narrative:  [name] is an 80 y.o. adult with medical history significant for peripheral neuropathy, DDD, depression, COPD, CKD stage IIIB, hypertension, HIV infection presents to the emergency department due to 4-week onset of cough, sore throat, subjective fever, intermittent diaphoresis and generalized weakness to the extent that patient has difficulty in being able to ambulate, she states that she has been mostly bedbound/recliner bound since onset of symptoms about 4 weeks ago.  Patient states that she was given 2 different antibiotics, but she was unable to remember the names.  She endorsed occasional choking on food/drink.  Patient states that her PCP was trying to place at [facility] for rehab so as to get her strength back.  Patient lives with a roommate.  New events last 24 hours / Subjective: States that she continues to feel sick.  Diarrhea has now resolved.  Still having productive cough  Assessment & Plan:   Principal Problem:   CAP (community acquired pneumonia) Active Problems:   HIV disease ([place])   Esophageal dysphagia   Essential hypertension   CKD (chronic kidney disease) stage 3, GFR 30-59 ml/min (HCC)   COPD (chronic obstructive pulmonary disease) (HCC)   Generalized weakness   Elevated MCV   Community-acquired pneumonia -Failed outpatient treatment -CT chest with suggestion of bilateral upper lobe infiltrates, multifocal pneumonia -Continue ceftriaxone, azithromycin, flagyl  -Sputum culture pending  Generalized weakness, frailty -PT OT evaluation recommending SNF placement   Dysphagia -SLP evaluation recommending dysphagia 3 diet   Hypertension -Continue losartan, Bystolic  CKD stage IIIa -Baseline creatinine 1.2-1.7 -Stable  HIV -Followed by Dr. [name] -Continue Triumeq   History of schizophrenia -Has had  visual and auditory hallucinations earlier this year, went to [facility] geripsych March 2022   Diarrhea -C. difficile negative -Resolved  DVT prophylaxis:  heparin injection 5,000 Units Start: 05/10/21 0600 SCDs Start: 05/09/21 2352  Code Status:     Code Status Orders  (From admission, onward)           Start     Ordered   05/09/21 2352  Full code  Continuous        05/09/21 2352           Code Status History     Date Active Date Inactive Code Status Order ID Comments User Context   12/23/2020 1653 12/27/2020 1359 Full Code [id number]  Layla Maw ED   10/02/2020 1641 10/08/2020 2152 Full Code [id number]  Virl Cagey, MD Inpatient   12/30/2019 0050 12/31/2019 1743 Full Code [id number]  Oswald Hillock, MD Inpatient   11/12/2018 1941 11/21/2018 1921 Full Code [id number]  Virl Cagey, MD Inpatient   12/17/2017 0617 12/19/2017 1924 Full Code [id number]  Vianne Bulls, MD ED   03/30/2017 0209 03/30/2017 1802 Full Code [id number]  Oswald Hillock, MD Inpatient   07/22/2016 1601 07/23/2016 1738 Full Code [id number]  Samuella Cota, MD Inpatient   05/31/2014 1714 06/02/2014 2142 Full Code [id number]  Radene Gunning, NP Inpatient      Advance Directive Documentation    Flowsheet Row Most Recent Value  Type of Advance Directive Living will  Pre-existing out of facility DNR order (yellow form or pink MOST form) --  "MOST" Form in Place? --      Family Communication: None at bedside Disposition Plan:  Status is:  Inpatient  Remains inpatient appropriate because:Unsafe d/c plan and IV treatments appropriate due to intensity of illness or inability to take PO  Dispo: The patient is from: Home              Anticipated d/c is to: SNF              Patient currently is not medically stable to d/c.   Difficult to place patient No    Antimicrobials:  Anti-infectives (From admission, onward)    Start     Dose/Rate Route Frequency Ordered Stop   05/10/21 1000   abacavir-dolutegravir-lamiVUDine (TRIUMEQ) 600-50-300 MG per tablet 1 tablet        1 tablet Oral Daily 05/10/21 0439     05/10/21 0900  cefTRIAXone (ROCEPHIN) 1 g in sodium chloride 0.9 % 100 mL IVPB        1 g 200 mL/hr over 30 Minutes Intravenous Every 24 hours 05/10/21 0228 05/16/21 0859   05/10/21 0900  azithromycin (ZITHROMAX) 500 mg in sodium chloride 0.9 % 250 mL IVPB        500 mg 250 mL/hr over 60 Minutes Intravenous Every 24 hours 05/10/21 0228 05/14/21 0859   05/09/21 2215  azithromycin (ZITHROMAX) 500 mg in sodium chloride 0.9 % 250 mL IVPB        500 mg 250 mL/hr over 60 Minutes Intravenous  Once 05/09/21 2210 05/10/21 0102   05/09/21 2145  cefTRIAXone (ROCEPHIN) 1 g in sodium chloride 0.9 % 100 mL IVPB        1 g 200 mL/hr over 30 Minutes Intravenous  Once 05/09/21 2143 05/09/21 2306   05/09/21 2145  metroNIDAZOLE (FLAGYL) IVPB 500 mg        500 mg 100 mL/hr over 60 Minutes Intravenous Every 8 hours 05/09/21 2143          Objective: Vitals:   05/10/21 0413 05/10/21 1623 05/10/21 2351 05/11/21 0500  BP: (!) 172/88 (!) 155/80 (!) 194/83 (!) 172/80  Pulse: 63 62 61 62  Resp: 20 17 16 16   Temp: 97.7 F (36.5 C) 98.2 F (36.8 C) 98 F (36.7 C) 98.4 F (36.9 C)  TempSrc: Oral Oral Oral Oral  SpO2: 100% 96% 95% 96%  Weight:      Height:        Intake/Output Summary (Last 24 hours) at 05/11/2021 1143 Last data filed at 05/11/2021 0900 Gross per 24 hour  Intake 480 ml  Output 800 ml  Net -320 ml    Filed Weights   05/10/21 0100  Weight: 74.7 kg   Examination: General exam: Appears calm and comfortable  Respiratory system: Clear to auscultation. Respiratory effort normal.  On room air Cardiovascular system: S1 & S2 heard, RRR. No pedal edema. Gastrointestinal system: Abdomen is nondistended, soft and nontender. Normal bowel sounds heard. Central nervous system: Alert and oriented. Non focal exam. Speech clear  Extremities: Symmetric in appearance  bilaterally  Skin: No rashes, lesions or ulcers on exposed skin  Psychiatry:  Mood & affect appropriate.    Data Reviewed: I have personally reviewed following labs and imaging studies  CBC: Recent Labs  Lab 05/09/21 1633 05/10/21 0340 05/11/21 0600  WBC 7.2 9.4 6.6  NEUTROABS 3.7  --   --   HGB 14.9 14.2 15.5*  HCT 42.8 41.2 45.4  MCV 105.2* 104.0* 104.1*  PLT 237 199 623    Basic Metabolic Panel: Recent Labs  Lab 05/09/21 1633 05/10/21 0340 05/11/21 0600  NA 137 138  140  K 4.5 4.4 4.4  CL 104 106 106  CO2 27 25 26   GLUCOSE 101* 103* 96  BUN 27* 23 19  CREATININE 1.28* 1.17* 1.15*  CALCIUM 10.4* 10.1 10.5*  MG  --  2.0  --   PHOS  --  2.9  --     GFR: Estimated Creatinine Clearance (by C-G formula based on SCr of 1.15 mg/dL (H)) Female: 36.9 mL/min (A) Female: 45.4 mL/min (A) Liver Function Tests: Recent Labs  Lab 05/09/21 1633 05/10/21 0340  AST 19 20  ALT 19 18  ALKPHOS 97 88  BILITOT 0.6 0.8  PROT 7.5 6.9  ALBUMIN 4.5 4.0    No results for input(s): LIPASE, AMYLASE in the last 168 hours. No results for input(s): AMMONIA in the last 168 hours. Coagulation Profile: Recent Labs  Lab 05/10/21 0340  INR 1.1    Cardiac Enzymes: No results for input(s): CKTOTAL, CKMB, CKMBINDEX, TROPONINI in the last 168 hours. BNP (last 3 results) No results for input(s): PROBNP in the last 8760 hours. HbA1C: No results for input(s): HGBA1C in the last 72 hours. CBG: No results for input(s): GLUCAP in the last 168 hours. Lipid Profile: No results for input(s): CHOL, HDL, LDLCALC, TRIG, CHOLHDL, LDLDIRECT in the last 72 hours. Thyroid Function Tests: No results for input(s): TSH, T4TOTAL, FREET4, T3FREE, THYROIDAB in the last 72 hours. Anemia Panel: Recent Labs    05/10/21 0341  VITAMINB12 729  FOLATE 15.1    Sepsis Labs: Recent Labs  Lab 05/10/21 0340  PROCALCITON <0.10     Recent Results (from the past 240 hour(s))  Resp Panel by RT-PCR (Flu  A&B, Covid) Nasopharyngeal Swab     Status: None   Collection Time: 05/09/21 10:00 PM   Specimen: Nasopharyngeal Swab; Nasopharyngeal(NP) swabs in vial transport medium  Result Value Ref Range Status   SARS Coronavirus 2 by RT PCR NEGATIVE NEGATIVE Final    Comment: (NOTE) SARS-CoV-2 target nucleic acids are NOT DETECTED.  The SARS-CoV-2 RNA is generally detectable in upper respiratory specimens during the acute phase of infection. The lowest concentration of SARS-CoV-2 viral copies this assay can detect is 138 copies/mL. A negative result does not preclude SARS-Cov-2 infection and should not be used as the sole basis for treatment or other patient management decisions. A negative result may occur with  improper specimen collection/handling, submission of specimen other than nasopharyngeal swab, presence of viral mutation(s) within the areas targeted by this assay, and inadequate number of viral copies(<138 copies/mL). A negative result must be combined with clinical observations, patient history, and epidemiological information. The expected result is Negative.  Fact Sheet for Patients:  EntrepreneurPulse.com.au  Fact Sheet for Healthcare Providers:  IncredibleEmployment.be  This test is no t yet approved or cleared by the [place] FDA and  has been authorized for detection and/or diagnosis of SARS-CoV-2 by FDA under an Emergency Use Authorization (EUA). This EUA will remain  in effect (meaning this test can be used) for the duration of the COVID-19 declaration under Section 564(b)(1) of the Act, 21 U.S.C.section 360bbb-3(b)(1), unless the authorization is terminated  or revoked sooner.       Influenza A by PCR NEGATIVE NEGATIVE Final   Influenza B by PCR NEGATIVE NEGATIVE Final    Comment: (NOTE) The Xpert Xpress SARS-CoV-2/FLU/RSV plus assay is intended as an aid in the diagnosis of influenza from Nasopharyngeal swab specimens  and should not be used as a sole basis for treatment. Nasal washings and aspirates  are unacceptable for Xpert Xpress SARS-CoV-2/FLU/RSV testing.  Fact Sheet for Patients: EntrepreneurPulse.com.au  Fact Sheet for Healthcare Providers: IncredibleEmployment.be  This test is not yet approved or cleared by the [place] FDA and has been authorized for detection and/or diagnosis of SARS-CoV-2 by FDA under an Emergency Use Authorization (EUA). This EUA will remain in effect (meaning this test can be used) for the duration of the COVID-19 declaration under Section 564(b)(1) of the Act, 21 U.S.C. section 360bbb-3(b)(1), unless the authorization is terminated or revoked.  Performed at [facility], [address], [place], [place] [address]   Expectorated Sputum Assessment w Gram Stain, Rflx to Resp Cult     Status: None   Collection Time: 05/10/21  2:26 AM   Specimen: Expectorated Sputum  Result Value Ref Range Status   Specimen Description   Final    EXPECTORATED SPUTUM Performed at [facility], [address] [place], [place] [address]    Special Requests   Final    NONE Performed at [facility], [address] [place], [place] [address]    Sputum evaluation   Final    THIS SPECIMEN IS ACCEPTABLE FOR SPUTUM CULTURE Performed at [facility], [place] [address] [place], [place] [address]    Report Status 05/10/2021 FINAL  Final  Culture, Respiratory w Gram Stain     Status: None (Preliminary result)   Collection Time: 05/10/21  2:26 AM  Result Value Ref Range Status   Specimen Description EXPECTORATED SPUTUM  Final   Special Requests NONE Reflexed from [phone]  Final   Gram Stain   Final    FEW WBC PRESENT,BOTH PMN AND MONONUCLEAR FEW GRAM NEGATIVE RODS FEW GRAM VARIABLE ROD RARE GRAM POSITIVE COCCI Performed at [facility], [place] [address] [place], [place] [address]    Culture PENDING  Incomplete   Report Status PENDING   Incomplete  Culture, blood (Routine X 2) w Reflex to ID Panel     Status: None (Preliminary result)   Collection Time: 05/10/21  3:40 AM   Specimen: Right Antecubital; Blood  Result Value Ref Range Status   Specimen Description RIGHT ANTECUBITAL  Final   Special Requests   Final    BOTTLES DRAWN AEROBIC AND ANAEROBIC Blood Culture adequate volume   Culture   Final    NO GROWTH 1 DAY Performed at [facility], [address], [place], [place] [address]    Report Status PENDING  Incomplete  Culture, blood (Routine X 2) w Reflex to ID Panel     Status: None (Preliminary result)   Collection Time: 05/10/21  3:48 AM   Specimen: BLOOD RIGHT HAND  Result Value Ref Range Status   Specimen Description BLOOD RIGHT HAND  Final   Special Requests   Final    BOTTLES DRAWN AEROBIC ONLY Blood Culture adequate volume   Culture   Final    NO GROWTH 1 DAY Performed at [facility], [address], [place], [place] [address]    Report Status PENDING  Incomplete  C Difficile Quick Screen w PCR reflex     Status: None   Collection Time: 05/10/21 11:56 AM   Specimen: STOOL  Result Value Ref Range Status   C Diff antigen NEGATIVE NEGATIVE Final   C Diff toxin NEGATIVE NEGATIVE Final   C Diff interpretation No C. difficile detected.  Final    Comment: Performed at [facility], [address], [place], [place] [address]  Radiology Studies: DG Chest 2 View  Result Date: 05/09/2021 CLINICAL DATA:  Cough EXAM: CHEST - 2 VIEW COMPARISON:  12/26/2020, 12/23/2020 FINDINGS: A rounded density has developed within the right hilar region possibly representing vascular shadow, hilar adenopathy, or a perihilar pulmonary nodule. The lungs are otherwise clear. No pneumothorax or pleural effusion. Cardiac size within normal limits. Pulmonary vascularity is normal. No acute bone abnormality. IMPRESSION: No radiographic evidence of acute cardiopulmonary disease. Interval development of a rounded opacity within the  right hilar region. See differential considerations above. This could be better assessed with dedicated contrast enhanced CT examination of the chest. Alternatively, follow-up chest radiograph in 4-6 weeks may helpful in documenting resolution. Electronically Signed   By: Fidela Salisbury MD   On: 05/09/2021 17:50   CT Chest W Contrast  Result Date: 05/09/2021 CLINICAL DATA:  Weakness and cough for 3 weeks, wheezing, abnormal chest x-ray at right hilum EXAM: CT CHEST WITH CONTRAST TECHNIQUE: Multidetector CT imaging of the chest was performed during intravenous contrast administration. CONTRAST:  32mL OMNIPAQUE IOHEXOL 300 MG/ML  SOLN COMPARISON:  05/09/2021 FINDINGS: Cardiovascular: The heart is unremarkable without pericardial effusion. Prominent atherosclerosis of the distal aortic arch and descending thoracic aorta. No evidence of aneurysm or dissection. Mediastinum/Nodes: No enlarged mediastinal, hilar, or axillary lymph nodes. Thyroid gland, trachea, and esophagus demonstrate no significant findings. Lungs/Pleura: There are patchy bilateral areas of ground-glass airspace disease most pronounced within the suprahilar regions, which could reflect early edema or atypical viral infection such as COVID-19. No effusion or pneumothorax. The central airways are patent. Upper Abdomen: No acute abnormality. Musculoskeletal: No acute or destructive bony lesions. Reconstructed images demonstrate no additional findings. IMPRESSION: 1. Patchy upper lobe predominant ground-glass airspace disease consistent with multifocal infection or early edema. 2. No abnormality at the right hilum to correspond to the chest x-ray finding. The rounded density at the right hilum on chest x-ray likely reflects a pulmonary vessel seen on end or clothing artifact. 3.  Aortic Atherosclerosis (ICD10-I70.0). Electronically Signed   By: Randa Ngo M.D.   On: 05/09/2021 21:08      Scheduled Meds:  abacavir-dolutegravir-lamiVUDine  1  tablet Oral Daily   dextromethorphan-guaiFENesin  1 tablet Oral BID   feeding supplement  237 mL Oral BID BM   heparin  5,000 Units Subcutaneous Q8H   losartan  50 mg Oral Daily   nebivolol  5 mg Oral Daily   pantoprazole  40 mg Oral QAC breakfast   Continuous Infusions:  azithromycin 500 mg (05/11/21 0909)   cefTRIAXone (ROCEPHIN)  IV 1 g (05/11/21 0907)   metronidazole 500 mg (05/11/21 0510)     LOS: 2 days      Time spent: 20 minutes   Dessa Phi, DO [facility] 05/11/2021, 11:43 AM   Available via Epic secure chat 7am-7pm After these hours, please refer to coverage provider listed on [email or web address]

## 2021-05-11 NOTE — Progress Notes (Signed)
Initial Nutrition Assessment  DOCUMENTATION CODES:  Not applicable  INTERVENTION:  Continue current diet as ordered, encourage PO intake Continue Ensure Enlive po BID, each supplement provides 350 kcal and 20 grams of protein MVI with minerals daily Request new measured weight  NUTRITION DIAGNOSIS:  Biting/chewing difficulty related to  (poor dentition) as evidenced by  (need for DYS 3 diet and dentures not present during hospitalization).  GOAL:  Patient will meet greater than or equal to 90% of their needs  MONITOR:  PO intake, Supplement acceptance, Labs  REASON FOR ASSESSMENT:  Malnutrition Screening Tool    ASSESSMENT:  80 y.o. adult with medical history of depression/anxiety, COPD, CKD3, HTN, HIV infection, and hx TIAs, presented to ED with 4-weeks of cough, and generalized weakness to the extent that she has been mostly bedbound/recliner bound.  Imaging in ED suggestive of PNA. Pt reports she was on outpatient antibiotics without improvement in her symptoms.  Pt reported occasional choking on her food, SLP consulted and placed on DYS 3 diet 7/17 due to lack of dentition. Wears dentures at baseline, but reports they are at home.  Attempted to call pt on room phone, unable to discuss recent nutrition hx at this time as pt answered but stated it was the wrong number. Reviewed weight hx, appears stable overall for quite some time with ~3% weight loss over the last 7 months which is not severe. Intake this admission is fair. Provider previously ordered ensure BID, will continue current nutrition interventions as ordered and follow-up with pt for in-person assessment.  Noted OT/PT recommend SNF at dc.   Average Meal Intake: 7/15-7/17: 63% intake x 2 recorded meals  Nutritionally Relevant Medications: Scheduled Meds:  abacavir-dolutegravir-lamiVUDine  1 tablet Oral Daily   feeding supplement  237 mL Oral BID BM   pantoprazole  40 mg Oral QAC breakfast   Continuous  Infusions:  azithromycin 500 mg (05/11/21 0909)   cefTRIAXone (ROCEPHIN)  IV 1 g (05/11/21 0907)   metronidazole 500 mg (05/11/21 0510)   Labs Reviewed: Creatinine 1.15 Calcium 10.5  NUTRITION - FOCUSED PHYSICAL EXAM: Defer to in-person assessment  Diet Order:   Diet Order             DIET DYS 3 Room service appropriate? Yes; Fluid consistency: Thin  Diet effective now                   EDUCATION NEEDS:  No education needs have been identified at this time  Skin:  Skin Assessment: Reviewed RN Assessment  Last BM:  7/17 - type 7  Height:  Ht Readings from Last 1 Encounters:  05/10/21 5\' 2"  (1.575 m)    Weight:  Wt Readings from Last 1 Encounters:  05/10/21 74.7 kg    Ideal Body Weight:  50 kg  BMI:  Body mass index is 30.12 kg/m.  Estimated Nutritional Needs:  Kcal:  1500-1700 kcal/d Protein:  75-85 g/d Fluid:  >1500 mL/d  Ranell Patrick, RD, LDN Clinical Dietitian Pager on [place]

## 2021-05-12 DIAGNOSIS — J189 Pneumonia, unspecified organism: Secondary | ICD-10-CM | POA: Diagnosis not present

## 2021-05-12 MED ORDER — LOSARTAN POTASSIUM 25 MG PO TABS
12.5000 mg | ORAL_TABLET | Freq: Every day | ORAL | Status: DC
  Administered 2021-05-12 – 2021-05-13 (×2): 12.5 mg via ORAL
  Filled 2021-05-12 (×3): qty 1

## 2021-05-12 MED ORDER — CLONAZEPAM 1 MG PO TABS: 1.0000 mg | ORAL_TABLET | Freq: Two times a day (BID) | ORAL | 0 refills | Status: DC | PRN

## 2021-05-12 MED ORDER — XTAMPZA ER 13.5 MG PO C12A: 1.0000 | EXTENDED_RELEASE_CAPSULE | Freq: Two times a day (BID) | ORAL | 0 refills | Status: DC

## 2021-05-12 MED ORDER — CARVEDILOL 3.125 MG PO TABS
6.2500 mg | ORAL_TABLET | Freq: Two times a day (BID) | ORAL | Status: DC
  Administered 2021-05-12 – 2021-05-14 (×5): 6.25 mg via ORAL
  Filled 2021-05-12 (×5): qty 2

## 2021-05-12 MED ORDER — AMOXICILLIN-POT CLAVULANATE 875-125 MG PO TABS: 1.0000 | ORAL_TABLET | Freq: Two times a day (BID) | ORAL | 0 refills | Status: AC

## 2021-05-12 MED ORDER — AMOXICILLIN-POT CLAVULANATE 875-125 MG PO TABS: 1.0000 | ORAL_TABLET | Freq: Two times a day (BID) | ORAL | 0 refills | Status: DC

## 2021-05-12 NOTE — TOC Progression Note (Signed)
Transition of Care [facility]) - Progression Note    Patient Details  Name: [name] MRN: [id number] Date of Birth: Aug 10, 1941  Transition of [facility]) CM/SW [place], [place] Phone Number: 05/12/2021, 4:13 PM  Clinical Narrative:    CSW awaiting answer from [facility] on if pt has a bed offer. Pt refuses [facility]. Pt has insurance auth approved and is ready if [facility] can accept. TOC to follow.   Expected Discharge Plan: Skilled Nursing Facility Barriers to Discharge: Barriers Resolved  Expected Discharge Plan and Services Expected Discharge Plan: [facility]         Expected Discharge Date: 05/12/21                                     Social Determinants of Health (SDOH) Interventions    Readmission Risk Interventions Readmission Risk Prevention Plan 05/11/2021  Transportation Screening Complete  PCP or Specialist Appt within 3-5 Days Complete  HRI or [place] Complete  Social Work Consult for [place] Planning/Counseling Complete  Palliative Care Screening Not Applicable  Medication Review Press photographer) Complete  Some recent data might be hidden

## 2021-05-12 NOTE — Evaluation (Signed)
Occupational Therapy Evaluation Patient Details Name: [name] MRN: [id number] DOB: Aug 12, 1941 Today's Date: 05/12/2021    History of Present Illness [name] is an 80 y.o. adult with medical history significant for peripheral neuropathy, DDD, depression, COPD, CKD stage IIIB, hypertension, HIV infection presents to the emergency department due to 4-week onset of cough, sore throat, subjective fever, intermittent diaphoresis and generalized weakness to the extent that patient has difficulty in being able to ambulate, she states that she has been mostly bedbound/recliner bound since onset of symptoms about 4 weeks ago.  Patient states that she was given 2 different antibiotics, but she was unable to remember the names.  She endorsed occasional choking on food/drink.  Patient states that her PCP was trying to place at [facility] for rehab so as to get her strength back.  Patient lives with a roommate   Clinical Impression   Pt agreeable to OT evaluation. Pt requires mod to max A for sit to stand using RW and mod A for bed mobility. Pt demonstrates R UE ROM deficits and L UE weakness. Pt will benefit from continued OT services in the hospital and recommended venue below to increase strength, balance, ROM, and endurance for safe ADL's.      Follow Up Recommendations  SNF    Equipment Recommendations  None recommended by OT           Precautions / Restrictions Restrictions Weight Bearing Restrictions: No      Mobility Bed Mobility Overal bed mobility: Needs Assistance Bed Mobility: Rolling;Sidelying to Sit;Sit to Supine Rolling: Min assist Sidelying to sit: Mod assist   Sit to supine: Mod assist   General bed mobility comments: slow labored movement    Transfers Overall transfer level: Needs assistance Equipment used: Rolling walker (2 wheeled) Transfers: Sit to/from Stand Sit to Stand: Mod assist;Max assist         General transfer comment: use of RW; able  to laterally step to R side for ~2 steps with extended time and labored movement.    Balance Overall balance assessment: Needs assistance Sitting-balance support: Feet supported;No upper extremity supported Sitting balance-Leahy Scale: Good Sitting balance - Comments: fair to good at EOB   Standing balance support: Bilateral upper extremity supported;During functional activity Standing balance-Leahy Scale: Poor Standing balance comment: using RW                           ADL either performed or assessed with clinical judgement   ADL Overall ADL's : Needs assistance/impaired                     Lower Body Dressing: Total assistance;Bed level Lower Body Dressing Details (indicate cue type and reason): donning socks Toilet Transfer: Moderate assistance;Maximal assistance;RW Toilet Transfer Details (indicate cue type and reason): Partially simulated via sit to stand from EOB.                 Vision Baseline Vision/History: Wears glasses Wears Glasses: Reading only Patient Visual Report: No change from baseline                  Pertinent Vitals/Pain Pain Assessment: Faces Faces Pain Scale: Hurts little more Pain Location: R shoulder during P/ROM Pain Descriptors / Indicators: Grimacing Pain Intervention(s): Limited activity within patient's tolerance;Monitored during session;Repositioned     Hand Dominance Right   Extremity/Trunk Assessment Upper Extremity Assessment Upper Extremity Assessment: RUE deficits/detail;LUE deficits/detail RUE  Deficits / Details: A/ROM ~90* shoulder flexion and abduction. P/ROM of shoulder ~165 to 170* with pain. R elbow flexion 4/5 MMT, elbow extension 3+/3. 3+/5 grip strength. Pt reports decreased use of R hand recently. RUE Coordination: decreased fine motor (Slow labored fine motor movements.) LUE Deficits / Details: WFL A/ROM. 3+/5 shoulder flexion. 4/5 elbow and grip MMT.   Lower Extremity Assessment Lower  Extremity Assessment: Defer to PT evaluation   Cervical / Trunk Assessment Cervical / Trunk Assessment: Normal   Communication Communication Communication: No difficulties   Cognition Arousal/Alertness: Awake/alert Behavior During Therapy: WFL for tasks assessed/performed Overall Cognitive Status: Within Functional Limits for tasks assessed                                                      Home Living Family/patient expects to be discharged to:: Private residence Living Arrangements: Non-relatives/Friends Available Help at Discharge: Available 24 hours/day;Family;Friend(s) (Roommate available 24/7; step-daughter PRN) Type of Home: Apartment Home Access: Stairs to enter [employer] of Steps: 4 Entrance Stairs-Rails: Right;Left;Can reach both Home Layout: One level     Bathroom Shower/Tub: Teacher, early years/pre: Handicapped height Bathroom Accessibility: Yes   Home Equipment: Environmental consultant - 2 wheels;Cane - single point;Bedside commode;Shower seat;Cane - quad          Prior Functioning/Environment Level of Independence: Needs assistance  Gait / Transfers Assistance Needed: Household ambulator with RW or cane until last 4 weeks when pt was bedbound. ADL's / Homemaking Assistance Needed: Assit from roommate and famil            OT Problem List: Decreased strength;Decreased range of motion;Impaired balance (sitting and/or standing);Decreased coordination      OT Treatment/Interventions: Self-care/ADL training;Therapeutic exercise;Patient/family education;Balance training;Therapeutic activities    OT Goals(Current goals can be found in the care plan section) Acute Rehab OT Goals Patient Stated Goal: return home OT Goal Formulation: With patient Time For Goal Achievement: 05/26/21 Potential to Achieve Goals: Good  OT Frequency: Min 2X/week   Barriers to D/C:                                           End of  Session Equipment Utilized During Treatment: Rolling walker;Gait belt  Activity Tolerance: Patient tolerated treatment well Patient left: in bed;with call bell/phone within reach  OT Visit Diagnosis: Unsteadiness on feet (R26.81);Muscle weakness (generalized) (M62.81)                Time: 3419-6222 OT Time Calculation (min): 28 min Charges:  OT General Charges $OT Visit: 1 Visit OT Evaluation $OT Eval Low Complexity: 1 Low  Mariachristina Holle OT, MOT  Larey Seat 05/12/2021, 9:53 AM

## 2021-05-12 NOTE — Plan of Care (Signed)
  Problem: Acute Rehab OT Goals (only OT should resolve) Goal: Pt. Will Perform Grooming Flowsheets (Taken 05/12/2021 0955) Pt Will Perform Grooming:  sitting  with modified independence  with adaptive equipment Goal: Pt. Will Perform Upper Body Dressing Flowsheets (Taken 05/12/2021 0955) Pt Will Perform Upper Body Dressing:  with modified independence  sitting Goal: Pt. Will Perform Lower Body Dressing Flowsheets (Taken 05/12/2021 0955) Pt Will Perform Lower Body Dressing:  with min assist  sitting/lateral leans  sit to/from stand  with adaptive equipment Goal: Pt. Will Transfer To Toilet Flowsheets (Taken 05/12/2021 0955) Pt Will Transfer to Toilet:  with min assist  with min guard assist  stand pivot transfer  bedside commode Goal: Pt/Caregiver Will Perform Home Exercise Program Flowsheets (Taken 05/12/2021 0955) Pt/caregiver will Perform Home Exercise Program:  Increased ROM  Increased strength  Right Upper extremity  Left upper extremity  With Supervision  [name] OT, MOT

## 2021-05-12 NOTE — Plan of Care (Signed)
  Problem: Education: Goal: Knowledge of General Education information will improve Description Including pain rating scale, medication(s)/side effects and non-pharmacologic comfort measures Outcome: Progressing   Problem: Health Behavior/Discharge Planning: Goal: Ability to manage health-related needs will improve Outcome: Progressing   

## 2021-05-12 NOTE — Plan of Care (Signed)

## 2021-05-12 NOTE — Discharge Summary (Addendum)
Physician Discharge Summary  [name] EZM:[id number] DOB: 05-27-1941 DOA: 05/09/2021  PCP: Wannetta Sender, FNP  Admit date: 05/09/2021 Discharge date: 05/13/2021  Admitted From: Home Disposition:  SNF   Recommendations for Outpatient Follow-up:  Follow up with PCP in 1 week  Discharge Condition: Stable CODE STATUS: Full  Diet recommendation:  Diet Orders (From admission, onward)     Start     Ordered   05/10/21 1806  DIET DYS 3 Room service appropriate? Yes; Fluid consistency: Thin  Diet effective now       Question Answer Comment  Room service appropriate? Yes   Fluid consistency: Thin      05/10/21 1805           Brief/Interim Summary: [name] is an 80 y.o. adult with medical history significant for peripheral neuropathy, DDD, depression, COPD, CKD stage IIIB, hypertension, HIV infection presents to the emergency department due to 4-week onset of cough, sore throat, subjective fever, intermittent diaphoresis and generalized weakness to the extent that patient has difficulty in being able to ambulate, she states that she has been mostly bedbound/recliner bound since onset of symptoms about 4 weeks ago.  Patient states that she was given 2 different antibiotics, but she was unable to remember the names.  She endorsed occasional choking on food/drink.  Patient states that her PCP was trying to place at [facility] for rehab so as to get her strength back.  Patient lives with a roommate.  She was treated with IV antibiotics, sputum culture with normal flora. Patient was evaluated by SLP, PT, OT during hospitalization.   Discharge Diagnoses:  Principal Problem:   CAP (community acquired pneumonia) Active Problems:   HIV disease ([place])   Esophageal dysphagia   Essential hypertension   CKD (chronic kidney disease) stage 3, GFR 30-59 ml/min (HCC)   COPD (chronic obstructive pulmonary disease) (HCC)   Generalized weakness   Elevated  MCV  Community-acquired pneumonia -Failed outpatient treatment -CT chest with suggestion of bilateral upper lobe infiltrates, multifocal pneumonia -Continue ceftriaxone, azithromycin, flagyl --> Augmentin on discharge  -Sputum culture with normal flora    Generalized weakness, frailty -PT OT evaluation recommending SNF placement   Dysphagia -SLP evaluation recommending dysphagia 3 diet   Hypertension -Continue losartan, coreg  CKD stage IIIa -Baseline creatinine 1.2-1.7 -Stable   HIV -Followed by Dr. [name] -Continue Triumeq   History of schizophrenia -Has had visual and auditory hallucinations earlier this year, went to [facility] geripsych March 2022    Diarrhea -C. difficile negative -Resolved      Discharge Instructions  Discharge Instructions     Increase activity slowly   Complete by: As directed       Allergies as of 05/12/2021       Reactions   Bee Venom Anaphylaxis   Promethazine Swelling   Tenofovir Disoproxil Fumarate [tenofovir Disoproxil Fumarate] Other (See Comments)   Renal failure, (08/27/2006)   Compazine  [prochlorperazine Edisylate] Nausea And Vomiting   Haloperidol Lactate Other (See Comments)   Reaction is muscle tension, causes severe spasms in face and neck. Forces eyes to roll in the back of the head   Sulfa Antibiotics Diarrhea   Brompheniramine-acetaminophen    Chlorcyclizine    Chlorpromazine    Codeine Itching, Nausea And Vomiting   Navane [thiothixene] Other (See Comments), Nausea And Vomiting   Same muscle spasm reaction as haldol   Prednisone    Brief mild psychosis and agitation   Promethazine Hcl Other (  See Comments)   Propoxyphene N-acetaminophen Itching, Nausea And Vomiting   Morphine Itching, Swelling        Medication List     STOP taking these medications    LORazepam 0.5 MG tablet Commonly known as: ATIVAN   nebivolol 5 MG tablet Commonly known as: Bystolic   prazosin 1 MG capsule Commonly known  as: MINIPRESS       TAKE these medications    acetaminophen 500 MG tablet Commonly known as: TYLENOL Take 2 tablets (1,000 mg total) by mouth every 6 (six) hours as needed for mild pain or moderate pain.   albuterol 108 (90 Base) MCG/ACT inhaler Commonly known as: VENTOLIN HFA Inhale 1-2 puffs into the lungs every 6 (six) hours as needed for wheezing or shortness of breath.   Allergy Relief 10 MG tablet Generic drug: loratadine Take 10 mg by mouth daily.   amoxicillin-clavulanate 875-125 MG tablet Commonly known as: Augmentin Take 1 tablet by mouth 2 (two) times daily for 5 days.   carvedilol 6.25 MG tablet Commonly known as: COREG Take 6.25 mg by mouth 2 (two) times daily.   clonazePAM 1 MG tablet Commonly known as: KLONOPIN Take 1 tablet (1 mg total) by mouth 2 (two) times daily as needed for anxiety.   diphenhydrAMINE 25 mg capsule Commonly known as: BENADRYL Take 25 mg by mouth daily as needed for itching (itching from codeine).   docusate sodium 100 MG capsule Commonly known as: COLACE Take 1 capsule (100 mg total) by mouth 2 (two) times daily.   DULoxetine 60 MG capsule Commonly known as: CYMBALTA Take 60 mg by mouth daily. What changed: Another medication with the same name was removed. Continue taking this medication, and follow the directions you see here.   Fish Oil Burp-Less 1000 MG Caps Take 1 capsule by mouth daily.   fluticasone 50 MCG/ACT nasal spray Commonly known as: FLONASE 2 SPRAYS INTO BOTH NOSTRILS AT BEDTIME. What changed: See the new instructions.   furosemide 40 MG tablet Commonly known as: LASIX Take 1 tablet (40 mg total) by mouth daily as needed for fluid. Hold until follow up with PCP   haloperidol 10 MG tablet Commonly known as: HALDOL Take 10 mg by mouth at bedtime.   ipratropium-albuterol 0.5-2.5 (3) MG/3ML Soln Commonly known as: DUONEB Inhale 3 mLs into the lungs every 6 (six) hours as needed (shortness of breath).    losartan 25 MG tablet Commonly known as: COZAAR Take 12.5 mg by mouth daily. What changed: Another medication with the same name was removed. Continue taking this medication, and follow the directions you see here.   magnesium 30 MG tablet Take 30 mg by mouth at bedtime.   nystatin powder Commonly known as: MYCOSTATIN/NYSTOP Apply 1 application topically 3 (three) times daily. Apply to groin region until redness and irritation improving   ondansetron 8 MG tablet Commonly known as: ZOFRAN Take 8 mg by mouth daily as needed for nausea or vomiting.   ONE-A-DAY 55 PLUS PO Take 1 tablet by mouth daily.   pantoprazole 40 MG tablet Commonly known as: Protonix Take 1 tablet (40 mg total) by mouth daily before breakfast.   polyvinyl alcohol 1.4 % ophthalmic solution Commonly known as: LIQUIFILM TEARS Place 1 drop into both eyes daily.   psyllium 95 % Pack Commonly known as: HYDROCIL/METAMUCIL Take 1 packet by mouth daily.   thiamine 100 MG tablet Commonly known as: Vitamin B-1 Take 100 mg by mouth daily.   Triumeq 600-50-300 MG  tablet Generic drug: abacavir-dolutegravir-lamiVUDine Take 1 tablet by mouth daily.   Vitamin D3 250 MCG (10000 UT) Tabs Take 1 tablet by mouth daily.   Xtampza ER 13.5 MG C12a Generic drug: oxyCODONE ER Take 1 capsule by mouth 2 (two) times daily.        Follow-up Information     Wannetta Sender, FNP. Schedule an appointment as soon as possible for a visit in 1 week(s).   Specialty: Family Medicine Contact information: [facility] [address] [place] [address] [phone]                Allergies  Allergen Reactions   Bee Venom Anaphylaxis   Promethazine Swelling   Tenofovir Disoproxil Fumarate [Tenofovir Disoproxil Fumarate] Other (See Comments)    Renal failure, (08/27/2006)   Compazine  [Prochlorperazine Edisylate] Nausea And Vomiting   Haloperidol Lactate Other (See  Comments)    Reaction is muscle tension, causes severe spasms in face and neck. Forces eyes to roll in the back of the head   Sulfa Antibiotics Diarrhea   Brompheniramine-Acetaminophen    Chlorcyclizine    Chlorpromazine    Codeine Itching and Nausea And Vomiting   Navane [Thiothixene] Other (See Comments) and Nausea And Vomiting    Same muscle spasm reaction as haldol   Prednisone     Brief mild psychosis and agitation   Promethazine Hcl Other (See Comments)   Propoxyphene N-Acetaminophen Itching and Nausea And Vomiting   Morphine Itching and Swelling    Consultations: None    Procedures/Studies: DG Chest 2 View  Result Date: 05/09/2021 CLINICAL DATA:  Cough EXAM: CHEST - 2 VIEW COMPARISON:  12/26/2020, 12/23/2020 FINDINGS: A rounded density has developed within the right hilar region possibly representing vascular shadow, hilar adenopathy, or a perihilar pulmonary nodule. The lungs are otherwise clear. No pneumothorax or pleural effusion. Cardiac size within normal limits. Pulmonary vascularity is normal. No acute bone abnormality. IMPRESSION: No radiographic evidence of acute cardiopulmonary disease. Interval development of a rounded opacity within the right hilar region. See differential considerations above. This could be better assessed with dedicated contrast enhanced CT examination of the chest. Alternatively, follow-up chest radiograph in 4-6 weeks may helpful in documenting resolution. Electronically Signed   By: Fidela Salisbury MD   On: 05/09/2021 17:50   CT Chest W Contrast  Result Date: 05/09/2021 CLINICAL DATA:  Weakness and cough for 3 weeks, wheezing, abnormal chest x-ray at right hilum EXAM: CT CHEST WITH CONTRAST TECHNIQUE: Multidetector CT imaging of the chest was performed during intravenous contrast administration. CONTRAST:  22mL OMNIPAQUE IOHEXOL 300 MG/ML  SOLN COMPARISON:  05/09/2021 FINDINGS: Cardiovascular: The heart is unremarkable without pericardial effusion.  Prominent atherosclerosis of the distal aortic arch and descending thoracic aorta. No evidence of aneurysm or dissection. Mediastinum/Nodes: No enlarged mediastinal, hilar, or axillary lymph nodes. Thyroid gland, trachea, and esophagus demonstrate no significant findings. Lungs/Pleura: There are patchy bilateral areas of ground-glass airspace disease most pronounced within the suprahilar regions, which could reflect early edema or atypical viral infection such as COVID-19. No effusion or pneumothorax. The central airways are patent. Upper Abdomen: No acute abnormality. Musculoskeletal: No acute or destructive bony lesions. Reconstructed images demonstrate no additional findings. IMPRESSION: 1. Patchy upper lobe predominant ground-glass airspace disease consistent with multifocal infection or early edema. 2. No abnormality at the right hilum to correspond to the chest x-ray finding. The rounded density at the right hilum on chest x-ray likely reflects  a pulmonary vessel seen on end or clothing artifact. 3.  Aortic Atherosclerosis (ICD10-I70.0). Electronically Signed   By: Randa Ngo M.D.   On: 05/09/2021 21:08       Discharge Exam: Vitals:   05/11/21 2058 05/12/21 0503  BP: (!) 162/73 (!) 95/52  Pulse: (!) 58 63  Resp: 18 18  Temp: 98 F (36.7 C) 98 F (36.7 C)  SpO2: 95% 92%    General: Pt is alert, awake, not in acute distress Cardiovascular: RRR, S1/S2 +, no edema Respiratory: CTA bilaterally, no wheezing, no rhonchi, no respiratory distress, no conversational dyspnea, on room air  Abdominal: Soft, NT, ND, bowel sounds + Extremities: no edema, no cyanosis Psych: Stable     The results of significant diagnostics from this hospitalization (including imaging, microbiology, ancillary and laboratory) are listed below for reference.     Microbiology: Recent Results (from the past 240 hour(s))  Resp Panel by RT-PCR (Flu A&B, Covid) Nasopharyngeal Swab     Status: None   Collection Time:  05/09/21 10:00 PM   Specimen: Nasopharyngeal Swab; Nasopharyngeal(NP) swabs in vial transport medium  Result Value Ref Range Status   SARS Coronavirus 2 by RT PCR NEGATIVE NEGATIVE Final    Comment: (NOTE) SARS-CoV-2 target nucleic acids are NOT DETECTED.  The SARS-CoV-2 RNA is generally detectable in upper respiratory specimens during the acute phase of infection. The lowest concentration of SARS-CoV-2 viral copies this assay can detect is 138 copies/mL. A negative result does not preclude SARS-Cov-2 infection and should not be used as the sole basis for treatment or other patient management decisions. A negative result may occur with  improper specimen collection/handling, submission of specimen other than nasopharyngeal swab, presence of viral mutation(s) within the areas targeted by this assay, and inadequate number of viral copies(<138 copies/mL). A negative result must be combined with clinical observations, patient history, and epidemiological information. The expected result is Negative.  Fact Sheet for Patients:  EntrepreneurPulse.com.au  Fact Sheet for Healthcare Providers:  IncredibleEmployment.be  This test is no t yet approved or cleared by the [place] FDA and  has been authorized for detection and/or diagnosis of SARS-CoV-2 by FDA under an Emergency Use Authorization (EUA). This EUA will remain  in effect (meaning this test can be used) for the duration of the COVID-19 declaration under Section 564(b)(1) of the Act, 21 U.S.C.section 360bbb-3(b)(1), unless the authorization is terminated  or revoked sooner.       Influenza A by PCR NEGATIVE NEGATIVE Final   Influenza B by PCR NEGATIVE NEGATIVE Final    Comment: (NOTE) The Xpert Xpress SARS-CoV-2/FLU/RSV plus assay is intended as an aid in the diagnosis of influenza from Nasopharyngeal swab specimens and should not be used as a sole basis for treatment. Nasal washings  and aspirates are unacceptable for Xpert Xpress SARS-CoV-2/FLU/RSV testing.  Fact Sheet for Patients: EntrepreneurPulse.com.au  Fact Sheet for Healthcare Providers: IncredibleEmployment.be  This test is not yet approved or cleared by the [place] FDA and has been authorized for detection and/or diagnosis of SARS-CoV-2 by FDA under an Emergency Use Authorization (EUA). This EUA will remain in effect (meaning this test can be used) for the duration of the COVID-19 declaration under Section 564(b)(1) of the Act, 21 U.S.C. section 360bbb-3(b)(1), unless the authorization is terminated or revoked.  Performed at [facility], [address] [place],  [address]   Expectorated Sputum Assessment w Gram Stain, Rflx to Resp Cult     Status: None   Collection  Time: 05/10/21  2:26 AM   Specimen: Expectorated Sputum  Result Value Ref Range Status   Specimen Description   Final    EXPECTORATED SPUTUM Performed at [facility], [address] [place], [place] [address]    Special Requests   Final    NONE Performed at [facility], [address], [place], [place] [address]    Sputum evaluation   Final    THIS SPECIMEN IS ACCEPTABLE FOR SPUTUM CULTURE Performed at [facility], [place] [address], [place], [place] [address]    Report Status 05/10/2021 FINAL  Final  Culture, Respiratory w Gram Stain     Status: None (Preliminary result)   Collection Time: 05/10/21  2:26 AM  Result Value Ref Range Status   Specimen Description EXPECTORATED SPUTUM  Final   Special Requests NONE Reflexed from [phone]  Final   Gram Stain   Final    FEW WBC PRESENT,BOTH PMN AND MONONUCLEAR FEW GRAM NEGATIVE RODS FEW GRAM VARIABLE ROD RARE GRAM POSITIVE COCCI    Culture   Final    CULTURE REINCUBATED FOR BETTER GROWTH Performed at [facility], [place] [address] [place], [place] [address]    Report Status PENDING  Incomplete  Culture, blood (Routine X 2)  w Reflex to ID Panel     Status: None (Preliminary result)   Collection Time: 05/10/21  3:40 AM   Specimen: Right Antecubital; Blood  Result Value Ref Range Status   Specimen Description RIGHT ANTECUBITAL  Final   Special Requests   Final    BOTTLES DRAWN AEROBIC AND ANAEROBIC Blood Culture adequate volume   Culture   Final    NO GROWTH 1 DAY Performed at [facility], [address], [place], [place] [address]    Report Status PENDING  Incomplete  Culture, blood (Routine X 2) w Reflex to ID Panel     Status: None (Preliminary result)   Collection Time: 05/10/21  3:48 AM   Specimen: BLOOD RIGHT HAND  Result Value Ref Range Status   Specimen Description BLOOD RIGHT HAND  Final   Special Requests   Final    BOTTLES DRAWN AEROBIC ONLY Blood Culture adequate volume   Culture   Final    NO GROWTH 1 DAY Performed at [facility], [address] [place], [place] [address]    Report Status PENDING  Incomplete  C Difficile Quick Screen w PCR reflex     Status: None   Collection Time: 05/10/21 11:56 AM   Specimen: STOOL  Result Value Ref Range Status   C Diff antigen NEGATIVE NEGATIVE Final   C Diff toxin NEGATIVE NEGATIVE Final   C Diff interpretation No C. difficile detected.  Final    Comment: Performed at [facility] [address] [place], [place] [address]     Labs: BNP (last 3 results) No results for input(s): BNP in the last 8760 hours. Basic Metabolic Panel: Recent Labs  Lab 05/09/21 1633 05/10/21 0340 05/11/21 0600  NA 137 138 140  K 4.5 4.4 4.4  CL 104 106 106  CO2 27 25 26   GLUCOSE 101* 103* 96  BUN 27* 23 19  CREATININE 1.28* 1.17* 1.15*  CALCIUM 10.4* 10.1 10.5*  MG  --  2.0  --   PHOS  --  2.9  --    Liver Function Tests: Recent Labs  Lab 05/09/21 1633 05/10/21 0340  AST 19 20  ALT 19 18  ALKPHOS 97 88  BILITOT 0.6  0.8  PROT 7.5 6.9  ALBUMIN 4.5 4.0   No results for input(s): LIPASE, AMYLASE in the last 168 hours. No results for input(s):  AMMONIA in the last 168 hours. CBC: Recent Labs  Lab 05/09/21 1633 05/10/21 0340 05/11/21 0600  WBC 7.2 9.4 6.6  NEUTROABS 3.7  --   --   HGB 14.9 14.2 15.5*  HCT 42.8 41.2 45.4  MCV 105.2* 104.0* 104.1*  PLT 237 199 150   Cardiac Enzymes: No results for input(s): CKTOTAL, CKMB, CKMBINDEX, TROPONINI in the last 168 hours. BNP: Invalid input(s): POCBNP CBG: No results for input(s): GLUCAP in the last 168 hours. D-Dimer No results for input(s): DDIMER in the last 72 hours. Hgb A1c No results for input(s): HGBA1C in the last 72 hours. Lipid Profile No results for input(s): CHOL, HDL, LDLCALC, TRIG, CHOLHDL, LDLDIRECT in the last 72 hours. Thyroid function studies No results for input(s): TSH, T4TOTAL, T3FREE, THYROIDAB in the last 72 hours.  Invalid input(s): FREET3 Anemia work up Recent Labs    05/10/21 0341  VITAMINB12 729  FOLATE 15.1   Urinalysis    Component Value Date/Time   COLORURINE STRAW (A) 12/23/2020 1621   APPEARANCEUR CLEAR 12/23/2020 1621   APPEARANCEUR Turbid (A) 11/21/2020 1430   LABSPEC 1.010 12/23/2020 1621   PHURINE 6.0 12/23/2020 1621   GLUCOSEU NEGATIVE 12/23/2020 1621   GLUCOSEU NEG mg/dL 09/20/2007 1114   HGBUR NEGATIVE 12/23/2020 1621   HGBUR moderate 06/04/2010 [place] 12/23/2020 1621   BILIRUBINUR Negative 11/21/2020 1430   KETONESUR NEGATIVE 12/23/2020 1621   PROTEINUR 30 (A) 12/23/2020 1621   UROBILINOGEN 1.0 05/31/2014 0933   NITRITE NEGATIVE 12/23/2020 1621   LEUKOCYTESUR NEGATIVE 12/23/2020 1621   Sepsis Labs Invalid input(s): PROCALCITONIN,  WBC,  LACTICIDVEN Microbiology Recent Results (from the past 240 hour(s))  Resp Panel by RT-PCR (Flu A&B, Covid) Nasopharyngeal Swab     Status: None   Collection Time: 05/09/21 10:00 PM   Specimen: Nasopharyngeal Swab; Nasopharyngeal(NP) swabs in vial transport medium  Result Value Ref Range Status   SARS Coronavirus 2 by RT PCR NEGATIVE NEGATIVE Final    Comment:  (NOTE) SARS-CoV-2 target nucleic acids are NOT DETECTED.  The SARS-CoV-2 RNA is generally detectable in upper respiratory specimens during the acute phase of infection. The lowest concentration of SARS-CoV-2 viral copies this assay can detect is 138 copies/mL. A negative result does not preclude SARS-Cov-2 infection and should not be used as the sole basis for treatment or other patient management decisions. A negative result may occur with  improper specimen collection/handling, submission of specimen other than nasopharyngeal swab, presence of viral mutation(s) within the areas targeted by this assay, and inadequate number of viral copies(<138 copies/mL). A negative result must be combined with clinical observations, patient history, and epidemiological information. The expected result is Negative.  Fact Sheet for Patients:  EntrepreneurPulse.com.au  Fact Sheet for Healthcare Providers:  IncredibleEmployment.be  This test is no t yet approved or cleared by the [place] FDA and  has been authorized for detection and/or diagnosis of SARS-CoV-2 by FDA under an Emergency Use Authorization (EUA). This EUA will remain  in effect (meaning this test can be used) for the duration of the COVID-19 declaration under Section 564(b)(1) of the Act, 21 U.S.C.section 360bbb-3(b)(1), unless the authorization is terminated  or revoked sooner.       Influenza A by PCR NEGATIVE NEGATIVE Final   Influenza B by PCR NEGATIVE NEGATIVE Final    Comment: (  NOTE) The Xpert Xpress SARS-CoV-2/FLU/RSV plus assay is intended as an aid in the diagnosis of influenza from Nasopharyngeal swab specimens and should not be used as a sole basis for treatment. Nasal washings and aspirates are unacceptable for Xpert Xpress SARS-CoV-2/FLU/RSV testing.  Fact Sheet for Patients: EntrepreneurPulse.com.au  Fact Sheet for Healthcare  Providers: IncredibleEmployment.be  This test is not yet approved or cleared by the [place] FDA and has been authorized for detection and/or diagnosis of SARS-CoV-2 by FDA under an Emergency Use Authorization (EUA). This EUA will remain in effect (meaning this test can be used) for the duration of the COVID-19 declaration under Section 564(b)(1) of the Act, 21 U.S.C. section 360bbb-3(b)(1), unless the authorization is terminated or revoked.  Performed at [facility], [address] [place], [place] [address]   Expectorated Sputum Assessment w Gram Stain, Rflx to Resp Cult     Status: None   Collection Time: 05/10/21  2:26 AM   Specimen: Expectorated Sputum  Result Value Ref Range Status   Specimen Description   Final    EXPECTORATED SPUTUM Performed at [facility], [address] [place], [place] [address]    Special Requests   Final    NONE Performed at [facility], [address], [place], [place] [address]    Sputum evaluation   Final    THIS SPECIMEN IS ACCEPTABLE FOR SPUTUM CULTURE Performed at [facility], [place] [address] [place], [place] [address]    Report Status 05/10/2021 FINAL  Final  Culture, Respiratory w Gram Stain     Status: None (Preliminary result)   Collection Time: 05/10/21  2:26 AM  Result Value Ref Range Status   Specimen Description EXPECTORATED SPUTUM  Final   Special Requests NONE Reflexed from [phone]  Final   Gram Stain   Final    FEW WBC PRESENT,BOTH PMN AND MONONUCLEAR FEW GRAM NEGATIVE RODS FEW GRAM VARIABLE ROD RARE GRAM POSITIVE COCCI    Culture   Final    CULTURE REINCUBATED FOR BETTER GROWTH Performed at [facility], [place] [address], [place], [place] [address]    Report Status PENDING  Incomplete  Culture, blood (Routine X 2) w Reflex to ID Panel     Status: None (Preliminary result)   Collection Time: 05/10/21  3:40 AM   Specimen: Right Antecubital; Blood  Result Value Ref Range Status   Specimen  Description RIGHT ANTECUBITAL  Final   Special Requests   Final    BOTTLES DRAWN AEROBIC AND ANAEROBIC Blood Culture adequate volume   Culture   Final    NO GROWTH 1 DAY Performed at [facility], [address] [place], [place] [address]    Report Status PENDING  Incomplete  Culture, blood (Routine X 2) w Reflex to ID Panel     Status: None (Preliminary result)   Collection Time: 05/10/21  3:48 AM   Specimen: BLOOD RIGHT HAND  Result Value Ref Range Status   Specimen Description BLOOD RIGHT HAND  Final   Special Requests   Final    BOTTLES DRAWN AEROBIC ONLY Blood Culture adequate volume   Culture   Final    NO GROWTH 1 DAY Performed at [facility], [address] [place],  [address]    Report Status PENDING  Incomplete  C Difficile Quick Screen w PCR reflex     Status: None   Collection Time: 05/10/21 11:56 AM   Specimen: STOOL  Result Value Ref Range Status  C Diff antigen NEGATIVE NEGATIVE Final   C Diff toxin NEGATIVE NEGATIVE Final   C Diff interpretation No C. difficile detected.  Final    Comment: Performed at [facility], [address] [place],  [address]     Patient was seen and examined on the day of discharge and was found to be in stable condition. Time coordinating discharge: 35 minutes including assessment and coordination of care, as well as examination of the patient.   SIGNED:  Dessa Phi, DO [facility] 05/12/2021, 10:27 AM

## 2021-05-13 LAB — CULTURE, RESPIRATORY W GRAM STAIN: Culture: NORMAL

## 2021-05-13 LAB — RESP PANEL BY RT-PCR (FLU A&B, COVID) ARPGX2
Influenza A by PCR: NEGATIVE
Influenza B by PCR: NEGATIVE
SARS Coronavirus 2 by RT PCR: NEGATIVE

## 2021-05-13 NOTE — Progress Notes (Signed)
Physical Therapy Treatment Patient Details Name: [name] MRN: [id number] DOB: [birth year] Today's Date: 05/13/2021    History of Present Illness [name] is an 80 y.o. adult with medical history significant for peripheral neuropathy, DDD, depression, COPD, CKD stage IIIB, hypertension, HIV infection presents to the emergency department due to 4-week onset of cough, sore throat, subjective fever, intermittent diaphoresis and generalized weakness to the extent that patient has difficulty in being able to ambulate, she states that she has been mostly bedbound/recliner bound since onset of symptoms about 4 weeks ago.  Patient states that she was given 2 different antibiotics, but she was unable to remember the names.  She endorsed occasional choking on food/drink.  Patient states that her PCP was trying to place at [facility] for rehab so as to get her strength back.  Patient lives with a roommate    PT Comments    Patient presents seated in chair (assisted by OT staff) and agreeable for therapy.  Patient demonstrates fair/good return for completing BLE ROM/strengthening exercises with verbal cueing and demonstration, very unsteady on feet and limited to a few side steps mostly due to LLE weakness requiring Mod/max tactile assistance to move.  Patient put back to bed with Mod assist to reposition.  Patient will benefit from continued physical therapy in hospital and recommended venue below to increase strength, balance, endurance for safe ADLs and gait.     Follow Up Recommendations  SNF     Equipment Recommendations  None recommended by PT    Recommendations for Other Services       Precautions / Restrictions Precautions Precautions: Fall Restrictions Weight Bearing Restrictions: No    Mobility  Bed Mobility Overal bed mobility: Needs Assistance Bed Mobility: Sit to Sidelying;Rolling Rolling: Min assist       Sit to sidelying: Mod assist General bed mobility  comments: increased time, labored movement    Transfers Overall transfer level: Needs assistance Equipment used: Rolling walker (2 wheeled) Transfers: Sit to/from [facility] Sit to Stand: Mod assist Stand pivot transfers: Mod assist;Max assist       General transfer comment: diffiuclty advancing LLE due to weakness  Ambulation/Gait Ambulation/Gait assistance: Max assist Gait Distance (Feet): 4 Feet Assistive device: Rolling walker (2 wheeled) Gait Pattern/deviations: Decreased step length - right;Decreased step length - left;Decreased stride length;Decreased stance time - left Gait velocity: decreased   General Gait Details: limited to 4-5 slow labored short side steps requiring Max tactile assistance to move LLE due to weakness and poor standing balance   Stairs             Wheelchair Mobility    Modified Rankin (Stroke Patients Only)       Balance Overall balance assessment: Needs assistance Sitting-balance support: Feet supported;No upper extremity supported Sitting balance-Leahy Scale: Fair Sitting balance - Comments: fair/good seated at EOB   Standing balance support: During functional activity;Bilateral upper extremity supported Standing balance-Leahy Scale: Poor Standing balance comment: using RW                            Cognition Arousal/Alertness: Awake/alert Behavior During Therapy: WFL for tasks assessed/performed Overall Cognitive Status: Within Functional Limits for tasks assessed                                        Exercises General  Exercises - Lower Extremity Ankle Circles/Pumps: Seated;AROM;Strengthening;Both;10 reps Long Arc Quad: Seated;AROM;Strengthening;Both;10 reps Hip Flexion/Marching: Seated;AROM;Strengthening;Both;10 reps    General Comments        Pertinent Vitals/Pain Pain Assessment: No/denies pain    Home Living                      Prior Function             PT Goals (current goals can now be found in the care plan section) Acute Rehab PT Goals Patient Stated Goal: return home Progress towards PT goals: Progressing toward goals    Frequency    Min 3X/week      PT Plan Current plan remains appropriate    Co-evaluation              AM-PAC PT "6 Clicks" Mobility   Outcome Measure  Help needed turning from your back to your side while in a flat bed without using bedrails?: A Little Help needed moving from lying on your back to sitting on the side of a flat bed without using bedrails?: A Lot Help needed moving to and from a bed to a chair (including a wheelchair)?: A Lot Help needed standing up from a chair using your arms (e.g., wheelchair or bedside chair)?: A Lot Help needed to walk in hospital room?: A Lot Help needed climbing 3-5 steps with a railing? : Total 6 Click Score: 12    End of Session   Activity Tolerance: Patient tolerated treatment well;Patient limited by fatigue Patient left: in bed;with call bell/phone within reach;with bed alarm set Nurse Communication: Mobility status PT Visit Diagnosis: Unsteadiness on feet (R26.81);Muscle weakness (generalized) (M62.81);Difficulty in walking, not elsewhere classified (R26.2)     Time: 8413-2440 PT Time Calculation (min) (ACUTE ONLY): 30 min  Charges:  $Therapeutic Exercise: 8-22 mins $Therapeutic Activity: 8-22 mins                     3:19 PM, 05/13/21 Lonell Grandchild, MPT Physical Therapist with [facility] [phone] office [phone] mobile phone

## 2021-05-13 NOTE — Progress Notes (Signed)
RE: [name] Date of Birth: 17-Jan-1941 Date: 05/13/2021  MUST ID: [id number]  To Whom It May Concern:   Please be advised that the above named patient will require a short-term nursing home stay -- anticipated 30 days or less rehabilitation and strengthening. The plan is for return home.

## 2021-05-13 NOTE — Progress Notes (Signed)
RE: [name] Date of Birth: 1941-06-14 Date: 05/13/2021   MUST ID: [id number]   To Whom It May Concern:   Please be advised that the above named patient will require a short-term nursing home stay -- anticipated 30 days or less rehabilitation and strengthening. The plan is for return home.

## 2021-05-13 NOTE — Plan of Care (Signed)
  Problem: Education: Goal: Knowledge of General Education information will improve Description: Including pain rating scale, medication(s)/side effects and non-pharmacologic comfort measures 05/13/2021 1552 by Emmaline Life, RN Outcome: Adequate for Discharge 05/13/2021 0757 by Emmaline Life, RN Outcome: Progressing   Problem: Health Behavior/Discharge Planning: Goal: Ability to manage health-related needs will improve Outcome: Adequate for Discharge   Problem: Clinical Measurements: Goal: Ability to maintain clinical measurements within normal limits will improve Outcome: Adequate for Discharge Goal: Will remain free from infection Outcome: Adequate for Discharge Goal: Diagnostic test results will improve Outcome: Adequate for Discharge Goal: Respiratory complications will improve Outcome: Adequate for Discharge Goal: Cardiovascular complication will be avoided Outcome: Adequate for Discharge   Problem: Activity: Goal: Risk for activity intolerance will decrease Outcome: Adequate for Discharge   Problem: Nutrition: Goal: Adequate nutrition will be maintained Outcome: Adequate for Discharge   Problem: Elimination: Goal: Will not experience complications related to bowel motility Outcome: Adequate for Discharge Goal: Will not experience complications related to urinary retention Outcome: Adequate for Discharge   Problem: Coping: Goal: Level of anxiety will decrease Outcome: Adequate for Discharge   Problem: Pain Managment: Goal: General experience of comfort will improve Outcome: Adequate for Discharge   Problem: Safety: Goal: Ability to remain free from injury will improve Outcome: Adequate for Discharge   Problem: Skin Integrity: Goal: Risk for impaired skin integrity will decrease Outcome: Adequate for Discharge   Problem: Acute Rehab PT Goals(only PT should resolve) Goal: Pt Will Go Supine/Side To Sit Outcome: Adequate for Discharge Goal: Pt Will  Transfer Bed To Chair/Chair To Bed Outcome: Adequate for Discharge Goal: Pt Will Ambulate Outcome: Adequate for Discharge   Problem: Biting/Chewing (Masticatory) Difficulty ([place]-1.2) Goal: Food and/or nutrient delivery Description: Individualized approach for food/nutrient provision. Outcome: Adequate for Discharge   Problem: Acute Rehab OT Goals (only OT should resolve) Goal: Pt. Will Perform Grooming Outcome: Adequate for Discharge Goal: Pt. Will Perform Upper Body Dressing Outcome: Adequate for Discharge Goal: Pt. Will Perform Lower Body Dressing Outcome: Adequate for Discharge Goal: Pt. Will Transfer To Toilet Outcome: Adequate for Discharge Goal: Pt/Caregiver Will Perform Home Exercise Program Outcome: Adequate for Discharge

## 2021-05-13 NOTE — Progress Notes (Signed)
  PROGRESS NOTE  Patient remains stable for discharge to skilled nursing facility placement once bed available.  Patient seen and examined briefly, no change noted.  Discharge summary updated to reflect today's date.   Dessa Phi, DO [facility] 05/13/2021, 8:03 AM  Available via Epic secure chat 7am-7pm After these hours, please refer to coverage provider listed on [email or web address]

## 2021-05-13 NOTE — TOC Transition Note (Signed)
Transition of Care [facility]) - CM/SW Discharge Note   Patient Details  Name: [name] MRN: [id number] Date of Birth: 01/19/1941  Transition of Care [facility]) CM/SW Contact:  Iona Beard, [place] Phone Number: 05/13/2021, 3:25 PM   Clinical Narrative:    CSW spoke with [facility] in admissions at [facility], they have made a bed offer for pt and are willing to accept her today. CSW spoke with pt who as agreeable to this bed offer. CSW updated Pts DIL Ms. Collins that pt would be transferring to facility today. CSW updated RN and Attending DO. Pt will be going to room 205, the number for report is [phone], CSW provided this to pts RN. Discharge summary sent to [place]. Med necessity completed and printed to the 3rd floor nurses station. CSW scheduled EMS to transport pt to facility. TOC signing off.   Final next level of care: Skilled Nursing Facility Barriers to Discharge: Barriers Resolved   Patient Goals and CMS Choice Patient states their goals for this hospitalization and ongoing recovery are:: go to rehab CMS Medicare.gov Compare Post Acute Care list provided to:: Patient Choice offered to / list presented to : Patient  Discharge Placement              Patient chooses bed at: [facility] Patient to be transferred to facility by: EMS Name of family member notified: Ms. Theda Sers (DIL) Patient and family notified of of transfer: 05/13/21  Discharge Plan and Services                                     Social Determinants of Health (SDOH) Interventions     Readmission Risk Interventions Readmission Risk Prevention Plan 05/11/2021  Transportation Screening Complete  PCP or Specialist Appt within 3-5 Days Complete  HRI or Home Care Consult Complete  Social Work Consult for [place] Planning/Counseling Complete  Palliative Care Screening Not Applicable  Medication Review Press photographer) Complete  Some recent data might be hidden

## 2021-05-13 NOTE — Plan of Care (Signed)
  Problem: Education: Goal: Knowledge of General Education information will improve Description Including pain rating scale, medication(s)/side effects and non-pharmacologic comfort measures Outcome: Progressing   

## 2021-05-13 NOTE — Progress Notes (Signed)
Occupational Therapy Treatment Patient Details Name: [name] MRN: [id number] DOB: May 28, 1941 Today's Date: 05/13/2021    History of present illness [name] is an 80 y.o. adult with medical history significant for peripheral neuropathy, DDD, depression, COPD, CKD stage IIIB, hypertension, HIV infection presents to the emergency department due to 4-week onset of cough, sore throat, subjective fever, intermittent diaphoresis and generalized weakness to the extent that patient has difficulty in being able to ambulate, she states that she has been mostly bedbound/recliner bound since onset of symptoms about 4 weeks ago.  Patient states that she was given 2 different antibiotics, but she was unable to remember the names.  She endorsed occasional choking on food/drink.  Patient states that her PCP was trying to place at [facility] for rehab so as to get her strength back.  Patient lives with a roommate   OT comments  Pt mildly reluctant to engage in therapy this date due to headache of 8/10 pain. Pt agreed to transfer to chair and required Min to mod A for bed mobility, mod A for initial sit to stand, and mod to max A for SPT with use of RW. Pt fatigued during pivot as noted by more assist needed and L knee buckling. Pt left seated in chair with nursing notified. Pt will continue to benefit from acute OT services to address deficit areas.   Follow Up Recommendations  SNF    Equipment Recommendations  None recommended by OT          Precautions / Restrictions Precautions Precautions: Fall Restrictions Weight Bearing Restrictions: No       Mobility Bed Mobility Overal bed mobility: Needs Assistance Bed Mobility: Rolling;Sidelying to Sit Rolling: Min assist Sidelying to sit: Min assist;Mod assist       General bed mobility comments: slow labored movement    Transfers Overall transfer level: Needs assistance Equipment used: Rolling walker (2 wheeled) Transfers: Sit  to/from [facility] Sit to Stand: Mod assist Stand pivot transfers: Mod assist;Max assist       General transfer comment: use of RW; pt required more max A near terminal portion of transfer due to L knee buckling and fatigue.    Balance Overall balance assessment: Needs assistance Sitting-balance support: Feet supported;No upper extremity supported Sitting balance-Leahy Scale: Good Sitting balance - Comments: fair to good at EOB   Standing balance support: Bilateral upper extremity supported;During functional activity Standing balance-Leahy Scale: Poor Standing balance comment: using RW                           ADL either performed or assessed with clinical judgement   ADL Overall ADL's : Needs assistance/impaired                     Lower Body Dressing: Total assistance;Bed level Lower Body Dressing Details (indicate cue type and reason): donning socks Toilet Transfer: Moderate assistance;Maximal assistance;RW Toilet Transfer Details (indicate cue type and reason): Simulated via EOB to chair transfer with RW.                                   Cognition Arousal/Alertness: Awake/alert Behavior During Therapy: WFL for tasks assessed/performed Overall Cognitive Status: Within Functional Limits for tasks assessed  Pertinent Vitals/ Pain       Pain Assessment: 0-10 Pain Score: 8  Pain Location: headache Pain Descriptors / Indicators: Headache Pain Intervention(s): Limited activity within patient's tolerance;Monitored during session;Repositioned;Patient requesting pain meds-RN notified                                                          Frequency  Min 2X/week        Progress Toward Goals  OT Goals(current goals can now be found in the care plan section)  Progress towards OT goals: Progressing toward  goals  Acute Rehab OT Goals Patient Stated Goal: return home OT Goal Formulation: With patient Time For Goal Achievement: 05/26/21 Potential to Achieve Goals: Good ADL Goals Pt Will Perform Grooming: sitting;with modified independence;with adaptive equipment Pt Will Perform Upper Body Dressing: with modified independence;sitting Pt Will Perform Lower Body Dressing: with min assist;sitting/lateral leans;sit to/from stand;with adaptive equipment Pt Will Transfer to Toilet: with min assist;with min guard assist;stand pivot transfer;bedside commode Pt/caregiver will Perform Home Exercise Program: Increased ROM;Increased strength;Right Upper extremity;Left upper extremity;With Supervision  Plan Discharge plan remains appropriate                                    End of Session Equipment Utilized During Treatment: Rolling walker;Gait belt  OT Visit Diagnosis: Unsteadiness on feet (R26.81);Muscle weakness (generalized) (M62.81)   Activity Tolerance Patient tolerated treatment well   Patient Left with call bell/phone within reach;in chair             Time: 5465-0354 OT Time Calculation (min): 17 min  Charges: OT General Charges $OT Visit: 1 Visit OT Treatments $Self Care/Home Management : 8-22 mins  Tayvien Kane OT, MOT   Larey Seat 05/13/2021, 10:01 AM

## 2021-05-13 NOTE — TOC Progression Note (Signed)
CSW updated that pts PASRR had expired. CSW started new PASRR, has become level II. All documents have been updated. CSW updated facility, they will accept pt when PASRR number is active.   Pt has insurance auth. Reference ID: [id number] Plan auth ID: [id number] 7/18-7/20 with a review date on 7/20

## 2021-05-14 DIAGNOSIS — R1319 Other dysphagia: Secondary | ICD-10-CM | POA: Diagnosis not present

## 2021-05-14 DIAGNOSIS — J189 Pneumonia, unspecified organism: Secondary | ICD-10-CM | POA: Diagnosis not present

## 2021-05-14 DIAGNOSIS — N1832 Chronic kidney disease, stage 3b: Secondary | ICD-10-CM | POA: Diagnosis not present

## 2021-05-14 DIAGNOSIS — J449 Chronic obstructive pulmonary disease, unspecified: Secondary | ICD-10-CM | POA: Diagnosis not present

## 2021-05-14 MED ORDER — GUAIFENESIN-DM 100-10 MG/5ML PO SYRP
10.0000 mL | ORAL_SOLUTION | Freq: Three times a day (TID) | ORAL | Status: DC
  Administered 2021-05-14 – 2021-05-17 (×10): 10 mL via ORAL
  Filled 2021-05-14 (×11): qty 10

## 2021-05-14 MED ORDER — HYDRALAZINE HCL 20 MG/ML IJ SOLN: 2.0000 mg | Freq: Four times a day (QID) | INTRAMUSCULAR | Status: DC | PRN

## 2021-05-14 MED ORDER — CARVEDILOL 12.5 MG PO TABS
12.5000 mg | ORAL_TABLET | Freq: Two times a day (BID) | ORAL | Status: DC
  Administered 2021-05-14: 12.5 mg via ORAL
  Filled 2021-05-14: qty 1

## 2021-05-14 MED ORDER — GUAIFENESIN-DM 100-10 MG/5ML PO SYRP: 10.0000 mL | ORAL_SOLUTION | Freq: Three times a day (TID) | ORAL | 0 refills | Status: AC

## 2021-05-14 MED ORDER — CARVEDILOL 12.5 MG PO TABS: 12.5000 mg | ORAL_TABLET | Freq: Two times a day (BID) | ORAL | 1 refills | Status: DC

## 2021-05-14 MED ORDER — LOSARTAN POTASSIUM 50 MG PO TABS
25.0000 mg | ORAL_TABLET | Freq: Every day | ORAL | Status: DC
  Administered 2021-05-14 – 2021-05-17 (×4): 25 mg via ORAL
  Filled 2021-05-14 (×4): qty 1

## 2021-05-14 NOTE — Discharge Summary (Signed)
Physician Discharge Summary  [name] ENI:[id number] DOB: 1941-04-23 DOA: 05/09/2021  PCP: Wannetta Sender, FNP  Admit date: 05/09/2021 Discharge date: 05/14/2021  Admitted From: Home Disposition:  SNF   Recommendations for Outpatient Follow-up:  Follow up with PCP in 1 week  Discharge Condition: The patient was seen and examined 05/14/2021, stable no acute distress ready for discharge to SNF pending PASRR  CODE STATUS: Full  Diet recommendation:  Diet Orders (From admission, onward)     Start     Ordered   05/14/21 0000  Diet - low sodium heart healthy        05/14/21 1058   05/10/21 1806  DIET DYS 3 Room service appropriate? Yes; Fluid consistency: Thin  Diet effective now       Question Answer Comment  Room service appropriate? Yes   Fluid consistency: Thin      05/10/21 1805           Brief/Interim Summary: [name] is an 80 y.o. adult with medical history significant for peripheral neuropathy, DDD, depression, COPD, CKD stage IIIB, hypertension, HIV infection presents to the emergency department due to 4-week onset of cough, sore throat, subjective fever, intermittent diaphoresis and generalized weakness to the extent that patient has difficulty in being able to ambulate, she states that she has been mostly bedbound/recliner bound since onset of symptoms about 4 weeks ago.  Patient states that she was given 2 different antibiotics, but she was unable to remember the names.  She endorsed occasional choking on food/drink.  Patient states that her PCP was trying to place at [facility] for rehab so as to get her strength back.  Patient lives with a roommate.  She was treated with IV antibiotics, sputum culture with normal flora. Patient was evaluated by SLP, PT, OT during hospitalization.   Discharge Diagnoses:  Principal Problem:   CAP (community acquired pneumonia) Active Problems:   HIV disease ([place])   Esophageal dysphagia   Essential  hypertension   CKD (chronic kidney disease) stage 3, GFR 30-59 ml/min (HCC)   COPD (chronic obstructive pulmonary disease) (HCC)   Generalized weakness   Elevated MCV  Community-acquired pneumonia -Failed outpatient treatment -CT chest with suggestion of bilateral upper lobe infiltrates, multifocal pneumonia -Continue ceftriaxone, azithromycin, flagyl --> Augmentin on discharge  -Sputum culture with normal flora    Generalized weakness, frailty -PT OT evaluation recommending SNF placement   Dysphagia -SLP evaluation recommending dysphagia 3 diet   Hypertension -Continue losartan, coreg  CKD stage IIIa -Baseline creatinine 1.2-1.7 -Stable   HIV -Followed by Dr. [name] -Continue Triumeq   History of schizophrenia -Has had visual and auditory hallucinations earlier this year, went to [facility] geripsych March 2022    Diarrhea -C. difficile negative -Resolved      Discharge Instructions  Discharge Instructions     Activity as tolerated - No restrictions   Complete by: As directed    Diet - low sodium heart healthy   Complete by: As directed    Increase activity slowly   Complete by: As directed    Increase activity slowly   Complete by: As directed       Allergies as of 05/14/2021       Reactions   Bee Venom Anaphylaxis   Promethazine Swelling   Tenofovir Disoproxil Fumarate [tenofovir Disoproxil Fumarate] Other (See Comments)   Renal failure, (08/27/2006)   Compazine  [prochlorperazine Edisylate] Nausea And Vomiting   Haloperidol Lactate Other (See Comments)  Reaction is muscle tension, causes severe spasms in face and neck. Forces eyes to roll in the back of the head   Sulfa Antibiotics Diarrhea   Brompheniramine-acetaminophen    Chlorcyclizine    Chlorpromazine    Codeine Itching, Nausea And Vomiting   Navane [thiothixene] Other (See Comments), Nausea And Vomiting   Same muscle spasm reaction as haldol   Prednisone    Brief mild psychosis and  agitation   Promethazine Hcl Other (See Comments)   Propoxyphene N-acetaminophen Itching, Nausea And Vomiting   Morphine Itching, Swelling        Medication List     STOP taking these medications    LORazepam 0.5 MG tablet Commonly known as: ATIVAN   nebivolol 5 MG tablet Commonly known as: Bystolic   prazosin 1 MG capsule Commonly known as: MINIPRESS       TAKE these medications    acetaminophen 500 MG tablet Commonly known as: TYLENOL Take 2 tablets (1,000 mg total) by mouth every 6 (six) hours as needed for mild pain or moderate pain.   albuterol 108 (90 Base) MCG/ACT inhaler Commonly known as: VENTOLIN HFA Inhale 1-2 puffs into the lungs every 6 (six) hours as needed for wheezing or shortness of breath.   Allergy Relief 10 MG tablet Generic drug: loratadine Take 10 mg by mouth daily.   amoxicillin-clavulanate 875-125 MG tablet Commonly known as: Augmentin Take 1 tablet by mouth 2 (two) times daily for 5 days.   carvedilol 6.25 MG tablet Commonly known as: COREG Take 6.25 mg by mouth 2 (two) times daily.   clonazePAM 1 MG tablet Commonly known as: KLONOPIN Take 1 tablet (1 mg total) by mouth 2 (two) times daily as needed for anxiety.   diphenhydrAMINE 25 mg capsule Commonly known as: BENADRYL Take 25 mg by mouth daily as needed for itching (itching from codeine).   docusate sodium 100 MG capsule Commonly known as: COLACE Take 1 capsule (100 mg total) by mouth 2 (two) times daily.   DULoxetine 60 MG capsule Commonly known as: CYMBALTA Take 60 mg by mouth daily. What changed: Another medication with the same name was removed. Continue taking this medication, and follow the directions you see here.   Fish Oil Burp-Less 1000 MG Caps Take 1 capsule by mouth daily.   fluticasone 50 MCG/ACT nasal spray Commonly known as: FLONASE 2 SPRAYS INTO BOTH NOSTRILS AT BEDTIME. What changed: See the new instructions.   furosemide 40 MG tablet Commonly known  as: LASIX Take 1 tablet (40 mg total) by mouth daily as needed for fluid. Hold until follow up with PCP   guaiFENesin-dextromethorphan 100-10 MG/5ML syrup Commonly known as: ROBITUSSIN DM Take 10 mLs by mouth every 8 (eight) hours for 10 days.   haloperidol 10 MG tablet Commonly known as: HALDOL Take 10 mg by mouth at bedtime.   ipratropium-albuterol 0.5-2.5 (3) MG/3ML Soln Commonly known as: DUONEB Inhale 3 mLs into the lungs every 6 (six) hours as needed (shortness of breath).   losartan 25 MG tablet Commonly known as: COZAAR Take 12.5 mg by mouth daily. What changed: Another medication with the same name was removed. Continue taking this medication, and follow the directions you see here.   magnesium 30 MG tablet Take 30 mg by mouth at bedtime.   nystatin powder Commonly known as: MYCOSTATIN/NYSTOP Apply 1 application topically 3 (three) times daily. Apply to groin region until redness and irritation improving   ondansetron 8 MG tablet Commonly known as: ZOFRAN Take  8 mg by mouth daily as needed for nausea or vomiting.   ONE-A-DAY 55 PLUS PO Take 1 tablet by mouth daily.   pantoprazole 40 MG tablet Commonly known as: Protonix Take 1 tablet (40 mg total) by mouth daily before breakfast.   polyvinyl alcohol 1.4 % ophthalmic solution Commonly known as: LIQUIFILM TEARS Place 1 drop into both eyes daily.   psyllium 95 % Pack Commonly known as: HYDROCIL/METAMUCIL Take 1 packet by mouth daily.   thiamine 100 MG tablet Commonly known as: Vitamin B-1 Take 100 mg by mouth daily.   Triumeq 600-50-300 MG tablet Generic drug: abacavir-dolutegravir-lamiVUDine Take 1 tablet by mouth daily.   Vitamin D3 250 MCG (10000 UT) Tabs Take 1 tablet by mouth daily.   Xtampza ER 13.5 MG C12a Generic drug: oxyCODONE ER Take 1 capsule by mouth 2 (two) times daily.        Contact information for follow-up providers     Wannetta Sender, FNP. Schedule an appointment as  soon as possible for a visit in 1 week(s).   Specialty: Family Medicine Contact information: [facility] [facility] [address] [place] [address] [phone]              Contact information for after-discharge care     Destination     [facility] .   Service: Skilled Nursing Contact information: [place] [address] [phone]                    Allergies  Allergen Reactions   Bee Venom Anaphylaxis   Promethazine Swelling   Tenofovir Disoproxil Fumarate [Tenofovir Disoproxil Fumarate] Other (See Comments)    Renal failure, (08/27/2006)   Compazine  [Prochlorperazine Edisylate] Nausea And Vomiting   Haloperidol Lactate Other (See Comments)    Reaction is muscle tension, causes severe spasms in face and neck. Forces eyes to roll in the back of the head   Sulfa Antibiotics Diarrhea   Brompheniramine-Acetaminophen    Chlorcyclizine    Chlorpromazine    Codeine Itching and Nausea And Vomiting   Navane [Thiothixene] Other (See Comments) and Nausea And Vomiting    Same muscle spasm reaction as haldol   Prednisone     Brief mild psychosis and agitation   Promethazine Hcl Other (See Comments)   Propoxyphene N-Acetaminophen Itching and Nausea And Vomiting   Morphine Itching and Swelling    Consultations: None    Procedures/Studies: DG Chest 2 View  Result Date: 05/09/2021 CLINICAL DATA:  Cough EXAM: CHEST - 2 VIEW COMPARISON:  12/26/2020, 12/23/2020 FINDINGS: A rounded density has developed within the right hilar region possibly representing vascular shadow, hilar adenopathy, or a perihilar pulmonary nodule. The lungs are otherwise clear. No pneumothorax or pleural effusion. Cardiac size within normal limits. Pulmonary vascularity is normal. No acute bone abnormality. IMPRESSION: No radiographic evidence of acute cardiopulmonary disease. Interval development of a rounded opacity within the  right hilar region. See differential considerations above. This could be better assessed with dedicated contrast enhanced CT examination of the chest. Alternatively, follow-up chest radiograph in 4-6 weeks may helpful in documenting resolution. Electronically Signed   By: Fidela Salisbury MD   On: 05/09/2021 17:50   CT Chest W Contrast  Result Date: 05/09/2021 CLINICAL DATA:  Weakness and cough for 3 weeks, wheezing, abnormal chest x-ray at right hilum EXAM: CT CHEST WITH CONTRAST TECHNIQUE: Multidetector CT imaging of the chest was performed during intravenous contrast  administration. CONTRAST:  45mL OMNIPAQUE IOHEXOL 300 MG/ML  SOLN COMPARISON:  05/09/2021 FINDINGS: Cardiovascular: The heart is unremarkable without pericardial effusion. Prominent atherosclerosis of the distal aortic arch and descending thoracic aorta. No evidence of aneurysm or dissection. Mediastinum/Nodes: No enlarged mediastinal, hilar, or axillary lymph nodes. Thyroid gland, trachea, and esophagus demonstrate no significant findings. Lungs/Pleura: There are patchy bilateral areas of ground-glass airspace disease most pronounced within the suprahilar regions, which could reflect early edema or atypical viral infection such as COVID-19. No effusion or pneumothorax. The central airways are patent. Upper Abdomen: No acute abnormality. Musculoskeletal: No acute or destructive bony lesions. Reconstructed images demonstrate no additional findings. IMPRESSION: 1. Patchy upper lobe predominant ground-glass airspace disease consistent with multifocal infection or early edema. 2. No abnormality at the right hilum to correspond to the chest x-ray finding. The rounded density at the right hilum on chest x-ray likely reflects a pulmonary vessel seen on end or clothing artifact. 3.  Aortic Atherosclerosis (ICD10-I70.0). Electronically Signed   By: Randa Ngo M.D.   On: 05/09/2021 21:08       Discharge Exam: Vitals:   05/13/21 2045 05/14/21 0351   BP: (!) 175/72 (!) 171/71  Pulse: 68 65  Resp: 18 18  Temp: 98.5 F (36.9 C) 98.5 F (36.9 C)  SpO2: 97% 95%    General: Pt is alert, awake, not in acute distress Cardiovascular: RRR, S1/S2 +, no edema Respiratory: CTA bilaterally, no wheezing, no rhonchi, no respiratory distress, no conversational dyspnea, on room air  Abdominal: Soft, NT, ND, bowel sounds + Extremities: no edema, no cyanosis Psych: Stable     The results of significant diagnostics from this hospitalization (including imaging, microbiology, ancillary and laboratory) are listed below for reference.     Microbiology: Recent Results (from the past 240 hour(s))  Resp Panel by RT-PCR (Flu A&B, Covid) Nasopharyngeal Swab     Status: None   Collection Time: 05/09/21 10:00 PM   Specimen: Nasopharyngeal Swab; Nasopharyngeal(NP) swabs in vial transport medium  Result Value Ref Range Status   SARS Coronavirus 2 by RT PCR NEGATIVE NEGATIVE Final    Comment: (NOTE) SARS-CoV-2 target nucleic acids are NOT DETECTED.  The SARS-CoV-2 RNA is generally detectable in upper respiratory specimens during the acute phase of infection. The lowest concentration of SARS-CoV-2 viral copies this assay can detect is 138 copies/mL. A negative result does not preclude SARS-Cov-2 infection and should not be used as the sole basis for treatment or other patient management decisions. A negative result may occur with  improper specimen collection/handling, submission of specimen other than nasopharyngeal swab, presence of viral mutation(s) within the areas targeted by this assay, and inadequate number of viral copies(<138 copies/mL). A negative result must be combined with clinical observations, patient history, and epidemiological information. The expected result is Negative.  Fact Sheet for Patients:  EntrepreneurPulse.com.au  Fact Sheet for Healthcare Providers:  IncredibleEmployment.be  This  test is no t yet approved or cleared by the [place] FDA and  has been authorized for detection and/or diagnosis of SARS-CoV-2 by FDA under an Emergency Use Authorization (EUA). This EUA will remain  in effect (meaning this test can be used) for the duration of the COVID-19 declaration under Section 564(b)(1) of the Act, 21 U.S.C.section 360bbb-3(b)(1), unless the authorization is terminated  or revoked sooner.       Influenza A by PCR NEGATIVE NEGATIVE Final   Influenza B by PCR NEGATIVE NEGATIVE Final    Comment: (NOTE) The Xpert Xpress  SARS-CoV-2/FLU/RSV plus assay is intended as an aid in the diagnosis of influenza from Nasopharyngeal swab specimens and should not be used as a sole basis for treatment. Nasal washings and aspirates are unacceptable for Xpert Xpress SARS-CoV-2/FLU/RSV testing.  Fact Sheet for Patients: EntrepreneurPulse.com.au  Fact Sheet for Healthcare Providers: IncredibleEmployment.be  This test is not yet approved or cleared by the [place] FDA and has been authorized for detection and/or diagnosis of SARS-CoV-2 by FDA under an Emergency Use Authorization (EUA). This EUA will remain in effect (meaning this test can be used) for the duration of the COVID-19 declaration under Section 564(b)(1) of the Act, 21 U.S.C. section 360bbb-3(b)(1), unless the authorization is terminated or revoked.  Performed at [facility], [address] [place], [place] [address]   Expectorated Sputum Assessment w Gram Stain, Rflx to Resp Cult     Status: None   Collection Time: 05/10/21  2:26 AM   Specimen: Expectorated Sputum  Result Value Ref Range Status   Specimen Description   Final    EXPECTORATED SPUTUM Performed at [facility], [address] [place], [place] [address]    Special Requests   Final    NONE Performed at [facility], [address], [name], [place] [address]    Sputum evaluation   Final    THIS SPECIMEN  IS ACCEPTABLE FOR SPUTUM CULTURE Performed at [facility], [place] [address], [place], [place] [address]    Report Status 05/10/2021 FINAL  Final  Culture, Respiratory w Gram Stain     Status: None   Collection Time: 05/10/21  2:26 AM  Result Value Ref Range Status   Specimen Description EXPECTORATED SPUTUM  Final   Special Requests NONE Reflexed from [id number]  Final   Gram Stain   Final    FEW WBC PRESENT,BOTH PMN AND MONONUCLEAR FEW GRAM NEGATIVE RODS FEW GRAM VARIABLE ROD RARE GRAM POSITIVE COCCI    Culture   Final    FEW Normal respiratory flora-no Staph aureus or Pseudomonas seen Performed at [facility], [place] [address] [place], [place] [address]    Report Status 05/13/2021 FINAL  Final  Culture, blood (Routine X 2) w Reflex to ID Panel     Status: None (Preliminary result)   Collection Time: 05/10/21  3:40 AM   Specimen: Right Antecubital; Blood  Result Value Ref Range Status   Specimen Description RIGHT ANTECUBITAL  Final   Special Requests   Final    BOTTLES DRAWN AEROBIC AND ANAEROBIC Blood Culture adequate volume   Culture   Final    NO GROWTH 4 DAYS Performed at [facility], [address] [place], [place] [address]    Report Status PENDING  Incomplete  Culture, blood (Routine X 2) w Reflex to ID Panel     Status: None (Preliminary result)   Collection Time: 05/10/21  3:48 AM   Specimen: BLOOD RIGHT HAND  Result Value Ref Range Status   Specimen Description BLOOD RIGHT HAND  Final   Special Requests   Final    BOTTLES DRAWN AEROBIC ONLY Blood Culture adequate volume   Culture   Final    NO GROWTH 4 DAYS Performed at [facility], [address] [place], [place] [address]    Report Status PENDING  Incomplete  C Difficile Quick Screen w PCR reflex     Status: None   Collection Time: 05/10/21 11:56 AM   Specimen: STOOL  Result Value Ref Range Status   C Diff  antigen NEGATIVE NEGATIVE Final   C Diff toxin NEGATIVE NEGATIVE Final   C Diff  interpretation No C. difficile detected.  Final    Comment: Performed at [facility], [address] [place], [place] [address]  Resp Panel by RT-PCR (Flu A&B, Covid) Nasopharyngeal Swab     Status: None   Collection Time: 05/13/21 10:10 AM   Specimen: Nasopharyngeal Swab; Nasopharyngeal(NP) swabs in vial transport medium  Result Value Ref Range Status   SARS Coronavirus 2 by RT PCR NEGATIVE NEGATIVE Final    Comment: (NOTE) SARS-CoV-2 target nucleic acids are NOT DETECTED.  The SARS-CoV-2 RNA is generally detectable in upper respiratory specimens during the acute phase of infection. The lowest concentration of SARS-CoV-2 viral copies this assay can detect is 138 copies/mL. A negative result does not preclude SARS-Cov-2 infection and should not be used as the sole basis for treatment or other patient management decisions. A negative result may occur with  improper specimen collection/handling, submission of specimen other than nasopharyngeal swab, presence of viral mutation(s) within the areas targeted by this assay, and inadequate number of viral copies(<138 copies/mL). A negative result must be combined with clinical observations, patient history, and epidemiological information. The expected result is Negative.  Fact Sheet for Patients:  EntrepreneurPulse.com.au  Fact Sheet for Healthcare Providers:  IncredibleEmployment.be  This test is no t yet approved or cleared by the [place] FDA and  has been authorized for detection and/or diagnosis of SARS-CoV-2 by FDA under an Emergency Use Authorization (EUA). This EUA will remain  in effect (meaning this test can be used) for the duration of the COVID-19 declaration under Section 564(b)(1) of the Act, 21 U.S.C.section 360bbb-3(b)(1), unless the authorization is terminated  or revoked sooner.       Influenza A by PCR NEGATIVE NEGATIVE Final   Influenza B by PCR NEGATIVE NEGATIVE Final     Comment: (NOTE) The Xpert Xpress SARS-CoV-2/FLU/RSV plus assay is intended as an aid in the diagnosis of influenza from Nasopharyngeal swab specimens and should not be used as a sole basis for treatment. Nasal washings and aspirates are unacceptable for Xpert Xpress SARS-CoV-2/FLU/RSV testing.  Fact Sheet for Patients: EntrepreneurPulse.com.au  Fact Sheet for Healthcare Providers: IncredibleEmployment.be  This test is not yet approved or cleared by the [place] FDA and has been authorized for detection and/or diagnosis of SARS-CoV-2 by FDA under an Emergency Use Authorization (EUA). This EUA will remain in effect (meaning this test can be used) for the duration of the COVID-19 declaration under Section 564(b)(1) of the Act, 21 U.S.C. section 360bbb-3(b)(1), unless the authorization is terminated or revoked.  Performed at [facility], [address] [place], [place] [address]      Labs: BNP (last 3 results) No results for input(s): BNP in the last 8760 hours. Basic Metabolic Panel: Recent Labs  Lab 05/09/21 1633 05/10/21 0340 05/11/21 0600  NA 137 138 140  K 4.5 4.4 4.4  CL 104 106 106  CO2 27 25 26   GLUCOSE 101* 103* 96  BUN 27* 23 19  CREATININE 1.28* 1.17* 1.15*  CALCIUM 10.4* 10.1 10.5*  MG  --  2.0  --   PHOS  --  2.9  --    Liver Function Tests: Recent Labs  Lab 05/09/21 1633 05/10/21 0340  AST 19 20  ALT 19 18  ALKPHOS 97 88  BILITOT 0.6 0.8  PROT 7.5 6.9  ALBUMIN 4.5 4.0   No results for input(s): LIPASE, AMYLASE in the last  168 hours. No results for input(s): AMMONIA in the last 168 hours. CBC: Recent Labs  Lab 05/09/21 1633 05/10/21 0340 05/11/21 0600  WBC 7.2 9.4 6.6  NEUTROABS 3.7  --   --   HGB 14.9 14.2 15.5*  HCT 42.8 41.2 45.4  MCV 105.2* 104.0* 104.1*  PLT 237 199 150   Cardiac Enzymes: No results for input(s): CKTOTAL, CKMB, CKMBINDEX, TROPONINI in the last 168 hours. BNP: Invalid  input(s): POCBNP CBG: No results for input(s): GLUCAP in the last 168 hours. D-Dimer No results for input(s): DDIMER in the last 72 hours. Hgb A1c No results for input(s): HGBA1C in the last 72 hours. Lipid Profile No results for input(s): CHOL, HDL, LDLCALC, TRIG, CHOLHDL, LDLDIRECT in the last 72 hours. Thyroid function studies No results for input(s): TSH, T4TOTAL, T3FREE, THYROIDAB in the last 72 hours.  Invalid input(s): FREET3 Anemia work up No results for input(s): VITAMINB12, FOLATE, FERRITIN, TIBC, IRON, RETICCTPCT in the last 72 hours.  Urinalysis    Component Value Date/Time   COLORURINE STRAW (A) 12/23/2020 1621   APPEARANCEUR CLEAR 12/23/2020 1621   APPEARANCEUR Turbid (A) 11/21/2020 1430   LABSPEC 1.010 12/23/2020 1621   PHURINE 6.0 12/23/2020 1621   GLUCOSEU NEGATIVE 12/23/2020 1621   GLUCOSEU NEG mg/dL 09/20/2007 1114   HGBUR NEGATIVE 12/23/2020 1621   HGBUR moderate 06/04/2010 1425   BILIRUBINUR NEGATIVE 12/23/2020 1621   BILIRUBINUR Negative 11/21/2020 1430   KETONESUR NEGATIVE 12/23/2020 1621   PROTEINUR 30 (A) 12/23/2020 1621   UROBILINOGEN 1.0 05/31/2014 0933   NITRITE NEGATIVE 12/23/2020 1621   LEUKOCYTESUR NEGATIVE 12/23/2020 1621   Sepsis Labs Invalid input(s): PROCALCITONIN,  WBC,  LACTICIDVEN Microbiology Recent Results (from the past 240 hour(s))  Resp Panel by RT-PCR (Flu A&B, Covid) Nasopharyngeal Swab     Status: None   Collection Time: 05/09/21 10:00 PM   Specimen: Nasopharyngeal Swab; Nasopharyngeal(NP) swabs in vial transport medium  Result Value Ref Range Status   SARS Coronavirus 2 by RT PCR NEGATIVE NEGATIVE Final    Comment: (NOTE) SARS-CoV-2 target nucleic acids are NOT DETECTED.  The SARS-CoV-2 RNA is generally detectable in upper respiratory specimens during the acute phase of infection. The lowest concentration of SARS-CoV-2 viral copies this assay can detect is 138 copies/mL. A negative result does not preclude  SARS-Cov-2 infection and should not be used as the sole basis for treatment or other patient management decisions. A negative result may occur with  improper specimen collection/handling, submission of specimen other than nasopharyngeal swab, presence of viral mutation(s) within the areas targeted by this assay, and inadequate number of viral copies(<138 copies/mL). A negative result must be combined with clinical observations, patient history, and epidemiological information. The expected result is Negative.  Fact Sheet for Patients:  EntrepreneurPulse.com.au  Fact Sheet for Healthcare Providers:  IncredibleEmployment.be  This test is no t yet approved or cleared by the [place] FDA and  has been authorized for detection and/or diagnosis of SARS-CoV-2 by FDA under an Emergency Use Authorization (EUA). This EUA will remain  in effect (meaning this test can be used) for the duration of the COVID-19 declaration under Section 564(b)(1) of the Act, 21 U.S.C.section 360bbb-3(b)(1), unless the authorization is terminated  or revoked sooner.       Influenza A by PCR NEGATIVE NEGATIVE Final   Influenza B by PCR NEGATIVE NEGATIVE Final    Comment: (NOTE) The Xpert Xpress SARS-CoV-2/FLU/RSV plus assay is intended as an aid in the diagnosis of influenza from Nasopharyngeal  swab specimens and should not be used as a sole basis for treatment. Nasal washings and aspirates are unacceptable for Xpert Xpress SARS-CoV-2/FLU/RSV testing.  Fact Sheet for Patients: EntrepreneurPulse.com.au  Fact Sheet for Healthcare Providers: IncredibleEmployment.be  This test is not yet approved or cleared by the [place] FDA and has been authorized for detection and/or diagnosis of SARS-CoV-2 by FDA under an Emergency Use Authorization (EUA). This EUA will remain in effect (meaning this test can be used) for the duration of  the COVID-19 declaration under Section 564(b)(1) of the Act, 21 U.S.C. section 360bbb-3(b)(1), unless the authorization is terminated or revoked.  Performed at [facility], [address] [place], [place] [address]   Expectorated Sputum Assessment w Gram Stain, Rflx to Resp Cult     Status: None   Collection Time: 05/10/21  2:26 AM   Specimen: Expectorated Sputum  Result Value Ref Range Status   Specimen Description   Final    EXPECTORATED SPUTUM Performed at [facility], [address], [place], [place] [address]    Special Requests   Final    NONE Performed at [facility], [address], [place], [place] [address]    Sputum evaluation   Final    THIS SPECIMEN IS ACCEPTABLE FOR SPUTUM CULTURE Performed at [facility], [place] [address], [place], [place] [address]    Report Status 05/10/2021 FINAL  Final  Culture, Respiratory w Gram Stain     Status: None   Collection Time: 05/10/21  2:26 AM  Result Value Ref Range Status   Specimen Description EXPECTORATED SPUTUM  Final   Special Requests NONE Reflexed from [id number]  Final   Gram Stain   Final    FEW WBC PRESENT,BOTH PMN AND MONONUCLEAR FEW GRAM NEGATIVE RODS FEW GRAM VARIABLE ROD RARE GRAM POSITIVE COCCI    Culture   Final    FEW Normal respiratory flora-no Staph aureus or Pseudomonas seen Performed at [facility], [place] [address], [place], [place] [address]    Report Status 05/13/2021 FINAL  Final  Culture, blood (Routine X 2) w Reflex to ID Panel     Status: None (Preliminary result)   Collection Time: 05/10/21  3:40 AM   Specimen: Right Antecubital; Blood  Result Value Ref Range Status   Specimen Description RIGHT ANTECUBITAL  Final   Special Requests   Final    BOTTLES DRAWN AEROBIC AND ANAEROBIC Blood Culture adequate volume   Culture   Final    NO GROWTH 4 DAYS Performed at [facility], [address], [place], [place] [address]    Report Status PENDING  Incomplete  Culture, blood (Routine X 2) w  Reflex to ID Panel     Status: None (Preliminary result)   Collection Time: 05/10/21  3:48 AM   Specimen: BLOOD RIGHT HAND  Result Value Ref Range Status   Specimen Description BLOOD RIGHT HAND  Final   Special Requests   Final    BOTTLES DRAWN AEROBIC ONLY Blood Culture adequate volume   Culture   Final    NO GROWTH 4 DAYS Performed at [facility], [address], [place], [place] [address]    Report Status PENDING  Incomplete  C Difficile Quick Screen w PCR reflex     Status: None   Collection Time: 05/10/21 11:56 AM   Specimen: STOOL  Result Value Ref Range Status   C Diff antigen NEGATIVE NEGATIVE Final   C Diff toxin NEGATIVE NEGATIVE Final  C Diff interpretation No C. difficile detected.  Final    Comment: Performed at [facility], [address] [place], [place] [address]  Resp Panel by RT-PCR (Flu A&B, Covid) Nasopharyngeal Swab     Status: None   Collection Time: 05/13/21 10:10 AM   Specimen: Nasopharyngeal Swab; Nasopharyngeal(NP) swabs in vial transport medium  Result Value Ref Range Status   SARS Coronavirus 2 by RT PCR NEGATIVE NEGATIVE Final    Comment: (NOTE) SARS-CoV-2 target nucleic acids are NOT DETECTED.  The SARS-CoV-2 RNA is generally detectable in upper respiratory specimens during the acute phase of infection. The lowest concentration of SARS-CoV-2 viral copies this assay can detect is 138 copies/mL. A negative result does not preclude SARS-Cov-2 infection and should not be used as the sole basis for treatment or other patient management decisions. A negative result may occur with  improper specimen collection/handling, submission of specimen other than nasopharyngeal swab, presence of viral mutation(s) within the areas targeted by this assay, and inadequate number of viral copies(<138 copies/mL). A negative result must be combined with clinical observations, patient history, and epidemiological information. The expected result is Negative.  Fact  Sheet for Patients:  EntrepreneurPulse.com.au  Fact Sheet for Healthcare Providers:  IncredibleEmployment.be  This test is no t yet approved or cleared by the [place] FDA and  has been authorized for detection and/or diagnosis of SARS-CoV-2 by FDA under an Emergency Use Authorization (EUA). This EUA will remain  in effect (meaning this test can be used) for the duration of the COVID-19 declaration under Section 564(b)(1) of the Act, 21 U.S.C.section 360bbb-3(b)(1), unless the authorization is terminated  or revoked sooner.       Influenza A by PCR NEGATIVE NEGATIVE Final   Influenza B by PCR NEGATIVE NEGATIVE Final    Comment: (NOTE) The Xpert Xpress SARS-CoV-2/FLU/RSV plus assay is intended as an aid in the diagnosis of influenza from Nasopharyngeal swab specimens and should not be used as a sole basis for treatment. Nasal washings and aspirates are unacceptable for Xpert Xpress SARS-CoV-2/FLU/RSV testing.  Fact Sheet for Patients: EntrepreneurPulse.com.au  Fact Sheet for Healthcare Providers: IncredibleEmployment.be  This test is not yet approved or cleared by the [place] FDA and has been authorized for detection and/or diagnosis of SARS-CoV-2 by FDA under an Emergency Use Authorization (EUA). This EUA will remain in effect (meaning this test can be used) for the duration of the COVID-19 declaration under Section 564(b)(1) of the Act, 21 U.S.C. section 360bbb-3(b)(1), unless the authorization is terminated or revoked.  Performed at [facility], [address] [place], [place] [address]      Patient was seen and examined on the day of discharge and was found to be in stable condition. Time coordinating discharge: 35 minutes including assessment and coordination of care, as well as examination of the patient.   SIGNED:  Deatra James, DO [facility] 05/14/2021, 11:00 AM

## 2021-05-14 NOTE — Progress Notes (Signed)
Pt BP 207/99 c/o headache. Pt had no slurred speech, able to follow finger with eyes and no drift. Scheduled BP medicine and oxycodone given. Dr. Clearence Ped made aware. Pt BP down to 163/99 headache was relieved by scheduled medication. New orders for IV hydrazaline for Bp greater than 175. Will continue to monitor. Hal Neer RN 10:52 PM 05/14/21

## 2021-05-14 NOTE — TOC Progression Note (Signed)
Transition of Care [facility]) - Progression Note    Patient Details  Name: [name] MRN: [id number] Date of Birth: [birth year]  Transition of Care [facility]) CM/SW Contact  Shade Flood, LCSW Phone Number: 05/14/2021, 12:58 PM  Clinical Narrative:     TOC following. Received notice that patient will be seen for her level II PASRR screen tomorrow AM. Once the level II report is reviewed by the state, and PASRR number is assigned, pt can dc to [place]. Updated Admissions, [place], at [place]. Pt's insurance auth for SNF was due for an update today. TOC CMA will contact insurance today to update on situation and request auth extension.  TOC will follow.  Expected Discharge Plan: Skilled Nursing Facility Barriers to Discharge: Barriers Resolved  Expected Discharge Plan and Services Expected Discharge Plan: [place]         Expected Discharge Date: 05/14/21                                     Social Determinants of Health (SDOH) Interventions    Readmission Risk Interventions Readmission Risk Prevention Plan 05/11/2021  Transportation Screening Complete  PCP or Specialist Appt within 3-5 Days Complete  HRI or [place] Complete  Social Work Consult for [place] Planning/Counseling Complete  Palliative Care Screening Not Applicable  Medication Review [employer] photographer) Complete  Some recent data might be hidden

## 2021-05-15 DIAGNOSIS — J189 Pneumonia, unspecified organism: Secondary | ICD-10-CM | POA: Diagnosis not present

## 2021-05-15 DIAGNOSIS — N1832 Chronic kidney disease, stage 3b: Secondary | ICD-10-CM | POA: Diagnosis not present

## 2021-05-15 DIAGNOSIS — I10 Essential (primary) hypertension: Secondary | ICD-10-CM | POA: Diagnosis not present

## 2021-05-15 DIAGNOSIS — R531 Weakness: Secondary | ICD-10-CM | POA: Diagnosis not present

## 2021-05-15 LAB — CULTURE, BLOOD (ROUTINE X 2)
Culture: NO GROWTH
Culture: NO GROWTH
Special Requests: ADEQUATE
Special Requests: ADEQUATE

## 2021-05-15 MED ORDER — HYDRALAZINE HCL 10 MG PO TABS
5.0000 mg | ORAL_TABLET | Freq: Four times a day (QID) | ORAL | Status: DC | PRN
  Administered 2021-05-15: 5 mg via ORAL
  Filled 2021-05-15: qty 1

## 2021-05-15 MED ORDER — LABETALOL HCL 200 MG PO TABS
200.0000 mg | ORAL_TABLET | Freq: Two times a day (BID) | ORAL | Status: DC
  Administered 2021-05-15 – 2021-05-16 (×4): 200 mg via ORAL
  Filled 2021-05-15 (×5): qty 1

## 2021-05-15 MED ORDER — AMOXICILLIN-POT CLAVULANATE 875-125 MG PO TABS
1.0000 | ORAL_TABLET | Freq: Two times a day (BID) | ORAL | Status: DC
  Administered 2021-05-15 – 2021-05-17 (×4): 1 via ORAL
  Filled 2021-05-15 (×5): qty 1

## 2021-05-15 MED ORDER — LABETALOL HCL 200 MG PO TABS: 200.0000 mg | ORAL_TABLET | Freq: Two times a day (BID) | ORAL | 0 refills | Status: DC

## 2021-05-15 NOTE — TOC Progression Note (Signed)
Transition of Care [facility]) - Progression Note    Patient Details  Name: [name] MRN: [id number] Date of Birth: July 02, 1941  Transition of Care [facility]) CM/SW Contact  Natasha Bence, LCSW Phone Number: 05/15/2021, 2:24 PM  Clinical Narrative:    PASSR assessed patient. PASSR rep reported that results will be determined in 3-5 business days. TOC to follow.   Expected Discharge Plan: Skilled Nursing Facility Barriers to Discharge: Barriers Resolved  Expected Discharge Plan and Services Expected Discharge Plan: [place]         Expected Discharge Date: 05/14/21                                     Social Determinants of Health (SDOH) Interventions    Readmission Risk Interventions Readmission Risk Prevention Plan 05/11/2021  Transportation Screening Complete  PCP or Specialist Appt within 3-5 Days Complete  HRI or [place] Complete  Social Work Consult for [place] Planning/Counseling Complete  Palliative Care Screening Not Applicable  Medication Review Press photographer) Complete  Some recent data might be hidden

## 2021-05-15 NOTE — Care Management Important Message (Signed)
Important Message  Patient Details  Name: [name] MRN: [id number] Date of Birth: 1941-05-08   Medicare Important Message Given:  Yes     [name] 05/15/2021, 4:32 PM

## 2021-05-15 NOTE — Progress Notes (Signed)
Physician Discharge Summary  [name] IWL:[id number] DOB: December 22, 1940 DOA: 05/09/2021  PCP: Wannetta Sender, FNP  Admit date: 05/09/2021 Discharge date: 05/15/2021  Admitted From: Home Disposition:  SNF   Recommendations for Outpatient Follow-up:  Follow up with PCP in 1 week  Discharge Condition:  The patient was seen and examined, remained stable, noted diffuse rhonchi, and accelerated hypertension overnight (medication adjusted, labetalol added this morning)  Otherwise ready for discharge to SNF pending PASRR  The patient was seen and examined, remained stable   CODE STATUS: Full  Diet recommendation:  Diet Orders (From admission, onward)     Start     Ordered   05/14/21 0000  Diet - low sodium heart healthy        05/14/21 1058   05/10/21 1806  DIET DYS 3 Room service appropriate? Yes; Fluid consistency: Thin  Diet effective now       Question Answer Comment  Room service appropriate? Yes   Fluid consistency: Thin      05/10/21 1805           Brief/Interim Summary: [name] is an 80 y.o. adult with medical history significant for peripheral neuropathy, DDD, depression, COPD, CKD stage IIIB, hypertension, HIV infection presents to the emergency department due to 4-week onset of cough, sore throat, subjective fever, intermittent diaphoresis and generalized weakness to the extent that patient has difficulty in being able to ambulate, she states that she has been mostly bedbound/recliner bound since onset of symptoms about 4 weeks ago.  Patient states that she was given 2 different antibiotics, but she was unable to remember the names.  She endorsed occasional choking on food/drink.  Patient states that her PCP was trying to place at [facility] for rehab so as to get her strength back.  Patient lives with a roommate.  She was treated with IV antibiotics, sputum culture with normal flora. Patient was evaluated by SLP, PT, OT during hospitalization.    Discharge Diagnoses:  Principal Problem:   CAP (community acquired pneumonia) Active Problems:   HIV disease ([place])   Esophageal dysphagia   Essential hypertension   CKD (chronic kidney disease) stage 3, GFR 30-59 ml/min (HCC)   COPD (chronic obstructive pulmonary disease) (HCC)   Generalized weakness   Elevated MCV  Community-acquired pneumonia -Failed outpatient treatment -CT chest with suggestion of bilateral upper lobe infiltrates, multifocal pneumonia -Continue ceftriaxone, azithromycin, flagyl --> Augmentin on discharge  -Sputum culture with normal flora  -Persistent rhonchi, mucolytic with scheduled, continue pulmonary toiletry   Generalized weakness, frailty -PT OT evaluation recommending SNF placement   Dysphagia -SLP evaluation recommending dysphagia 3 diet   Hypertension -Hypertensive overnight -Continue losartan, Coreg was discontinued, substituted for labetalol for better blood pressure control  CKD stage IIIa -Baseline creatinine 1.2-1.7 -Stable   HIV -Followed by Dr. [name] -Continue Triumeq   History of schizophrenia -Has had visual and auditory hallucinations earlier this year, went to [facility] geripsych March 2022    Diarrhea -C. difficile negative -Resolved      Discharge Instructions  Discharge Instructions     Activity as tolerated - No restrictions   Complete by: As directed    Diet - low sodium heart healthy   Complete by: As directed    Increase activity slowly   Complete by: As directed    Increase activity slowly   Complete by: As directed       Allergies as of 05/15/2021  Reactions   Bee Venom Anaphylaxis   Promethazine Swelling   Tenofovir Disoproxil Fumarate [tenofovir Disoproxil Fumarate] Other (See Comments)   Renal failure, (08/27/2006)   Compazine  [prochlorperazine Edisylate] Nausea And Vomiting   Haloperidol Lactate Other (See Comments)   Reaction is muscle tension, causes severe spasms in face and  neck. Forces eyes to roll in the back of the head   Sulfa Antibiotics Diarrhea   Brompheniramine-acetaminophen    Chlorcyclizine    Chlorpromazine    Codeine Itching, Nausea And Vomiting   Navane [thiothixene] Other (See Comments), Nausea And Vomiting   Same muscle spasm reaction as haldol   Prednisone    Brief mild psychosis and agitation   Promethazine Hcl Other (See Comments)   Propoxyphene N-acetaminophen Itching, Nausea And Vomiting   Morphine Itching, Swelling        Medication List     STOP taking these medications    carvedilol 6.25 MG tablet Commonly known as: COREG   LORazepam 0.5 MG tablet Commonly known as: ATIVAN   nebivolol 5 MG tablet Commonly known as: Bystolic   prazosin 1 MG capsule Commonly known as: MINIPRESS       TAKE these medications    acetaminophen 500 MG tablet Commonly known as: TYLENOL Take 2 tablets (1,000 mg total) by mouth every 6 (six) hours as needed for mild pain or moderate pain.   albuterol 108 (90 Base) MCG/ACT inhaler Commonly known as: VENTOLIN HFA Inhale 1-2 puffs into the lungs every 6 (six) hours as needed for wheezing or shortness of breath.   Allergy Relief 10 MG tablet Generic drug: loratadine Take 10 mg by mouth daily.   amoxicillin-clavulanate 875-125 MG tablet Commonly known as: Augmentin Take 1 tablet by mouth 2 (two) times daily for 5 days.   clonazePAM 1 MG tablet Commonly known as: KLONOPIN Take 1 tablet (1 mg total) by mouth 2 (two) times daily as needed for anxiety.   diphenhydrAMINE 25 mg capsule Commonly known as: BENADRYL Take 25 mg by mouth daily as needed for itching (itching from codeine).   docusate sodium 100 MG capsule Commonly known as: COLACE Take 1 capsule (100 mg total) by mouth 2 (two) times daily.   DULoxetine 60 MG capsule Commonly known as: CYMBALTA Take 60 mg by mouth daily. What changed: Another medication with the same name was removed. Continue taking this medication, and  follow the directions you see here.   Fish Oil Burp-Less 1000 MG Caps Take 1 capsule by mouth daily.   fluticasone 50 MCG/ACT nasal spray Commonly known as: FLONASE 2 SPRAYS INTO BOTH NOSTRILS AT BEDTIME. What changed: See the new instructions.   furosemide 40 MG tablet Commonly known as: LASIX Take 1 tablet (40 mg total) by mouth daily as needed for fluid. Hold until follow up with PCP   guaiFENesin-dextromethorphan 100-10 MG/5ML syrup Commonly known as: ROBITUSSIN DM Take 10 mLs by mouth every 8 (eight) hours for 10 days.   haloperidol 10 MG tablet Commonly known as: HALDOL Take 10 mg by mouth at bedtime.   ipratropium-albuterol 0.5-2.5 (3) MG/3ML Soln Commonly known as: DUONEB Inhale 3 mLs into the lungs every 6 (six) hours as needed (shortness of breath).   labetalol 200 MG tablet Commonly known as: NORMODYNE Take 1 tablet (200 mg total) by mouth 2 (two) times daily.   losartan 25 MG tablet Commonly known as: COZAAR Take 12.5 mg by mouth daily. What changed: Another medication with the same name was removed. Continue taking this  medication, and follow the directions you see here.   magnesium 30 MG tablet Take 30 mg by mouth at bedtime.   nystatin powder Commonly known as: MYCOSTATIN/NYSTOP Apply 1 application topically 3 (three) times daily. Apply to groin region until redness and irritation improving   ondansetron 8 MG tablet Commonly known as: ZOFRAN Take 8 mg by mouth daily as needed for nausea or vomiting.   ONE-A-DAY 55 PLUS PO Take 1 tablet by mouth daily.   pantoprazole 40 MG tablet Commonly known as: Protonix Take 1 tablet (40 mg total) by mouth daily before breakfast.   polyvinyl alcohol 1.4 % ophthalmic solution Commonly known as: LIQUIFILM TEARS Place 1 drop into both eyes daily.   psyllium 95 % Pack Commonly known as: HYDROCIL/METAMUCIL Take 1 packet by mouth daily.   thiamine 100 MG tablet Commonly known as: Vitamin B-1 Take 100 mg by  mouth daily.   Triumeq 600-50-300 MG tablet Generic drug: abacavir-dolutegravir-lamiVUDine Take 1 tablet by mouth daily.   Vitamin D3 250 MCG (10000 UT) Tabs Take 1 tablet by mouth daily.   Xtampza ER 13.5 MG C12a Generic drug: oxyCODONE ER Take 1 capsule by mouth 2 (two) times daily.        Contact information for follow-up providers     Wannetta Sender, FNP. Schedule an appointment as soon as possible for a visit in 1 week(s).   Specialty: Family Medicine Contact information: [place] [facility] [address] [place] [address] [phone]              Contact information for after-discharge care     Destination     [facility] .   Service: Skilled Nursing Contact information: [place] [address] [phone]                    Allergies  Allergen Reactions   Bee Venom Anaphylaxis   Promethazine Swelling   Tenofovir Disoproxil Fumarate [Tenofovir Disoproxil Fumarate] Other (See Comments)    Renal failure, (08/27/2006)   Compazine  [Prochlorperazine Edisylate] Nausea And Vomiting   Haloperidol Lactate Other (See Comments)    Reaction is muscle tension, causes severe spasms in face and neck. Forces eyes to roll in the back of the head   Sulfa Antibiotics Diarrhea   Brompheniramine-Acetaminophen    Chlorcyclizine    Chlorpromazine    Codeine Itching and Nausea And Vomiting   Navane [Thiothixene] Other (See Comments) and Nausea And Vomiting    Same muscle spasm reaction as haldol   Prednisone     Brief mild psychosis and agitation   Promethazine Hcl Other (See Comments)   Propoxyphene N-Acetaminophen Itching and Nausea And Vomiting   Morphine Itching and Swelling    Consultations: None    Procedures/Studies: DG Chest 2 View  Result Date: 05/09/2021 CLINICAL DATA:  Cough EXAM: CHEST - 2 VIEW COMPARISON:  12/26/2020, 12/23/2020 FINDINGS: A rounded density has  developed within the right hilar region possibly representing vascular shadow, hilar adenopathy, or a perihilar pulmonary nodule. The lungs are otherwise clear. No pneumothorax or pleural effusion. Cardiac size within normal limits. Pulmonary vascularity is normal. No acute bone abnormality. IMPRESSION: No radiographic evidence of acute cardiopulmonary disease. Interval development of a rounded opacity within the right hilar region. See differential considerations above. This could be better assessed with dedicated contrast enhanced CT examination of the chest. Alternatively, follow-up chest radiograph in 4-6 weeks may helpful in documenting resolution.  Electronically Signed   By: Fidela Salisbury MD   On: 05/09/2021 17:50   CT Chest W Contrast  Result Date: 05/09/2021 CLINICAL DATA:  Weakness and cough for 3 weeks, wheezing, abnormal chest x-ray at right hilum EXAM: CT CHEST WITH CONTRAST TECHNIQUE: Multidetector CT imaging of the chest was performed during intravenous contrast administration. CONTRAST:  11mL OMNIPAQUE IOHEXOL 300 MG/ML  SOLN COMPARISON:  05/09/2021 FINDINGS: Cardiovascular: The heart is unremarkable without pericardial effusion. Prominent atherosclerosis of the distal aortic arch and descending thoracic aorta. No evidence of aneurysm or dissection. Mediastinum/Nodes: No enlarged mediastinal, hilar, or axillary lymph nodes. Thyroid gland, trachea, and esophagus demonstrate no significant findings. Lungs/Pleura: There are patchy bilateral areas of ground-glass airspace disease most pronounced within the suprahilar regions, which could reflect early edema or atypical viral infection such as COVID-19. No effusion or pneumothorax. The central airways are patent. Upper Abdomen: No acute abnormality. Musculoskeletal: No acute or destructive bony lesions. Reconstructed images demonstrate no additional findings. IMPRESSION: 1. Patchy upper lobe predominant ground-glass airspace disease consistent with  multifocal infection or early edema. 2. No abnormality at the right hilum to correspond to the chest x-ray finding. The rounded density at the right hilum on chest x-ray likely reflects a pulmonary vessel seen on end or clothing artifact. 3.  Aortic Atherosclerosis (ICD10-I70.0). Electronically Signed   By: Randa Ngo M.D.   On: 05/09/2021 21:08       Discharge Exam: Vitals:   05/15/21 0700 05/15/21 0837  BP:  (!) 154/63  Pulse:  67  Resp:  17  Temp: 98.2 F (36.8 C)   SpO2:  100%      Physical Exam:   General:  Alert, oriented, cooperative, no distress;   HEENT:  Normocephalic, PERRL, otherwise with in Normal limits   Neuro:  CNII-XII intact. , normal motor and sensation, reflexes intact   Lungs:   Bilateral diffuse rhonchi, , Respirations unlabored, no wheezes / crackles  Cardio:    S1/S2, RRR, No murmure, No Rubs or Gallops   Abdomen:   Soft, non-tender, bowel sounds active all four quadrants,  no guarding or peritoneal signs.  Muscular skeletal:  Severe global weakness Limited exam - in bed, able to move all 4 extremities, Normal strength,  2+ pulses,  symmetric, No pitting edema  Skin:  Dry, warm to touch, negative for any Rashes,  Wounds: Please see nursing documentation         The results of significant diagnostics from this hospitalization (including imaging, microbiology, ancillary and laboratory) are listed below for reference.     Microbiology: Recent Results (from the past 240 hour(s))  Resp Panel by RT-PCR (Flu A&B, Covid) Nasopharyngeal Swab     Status: None   Collection Time: 05/09/21 10:00 PM   Specimen: Nasopharyngeal Swab; Nasopharyngeal(NP) swabs in vial transport medium  Result Value Ref Range Status   SARS Coronavirus 2 by RT PCR NEGATIVE NEGATIVE Final    Comment: (NOTE) SARS-CoV-2 target nucleic acids are NOT DETECTED.  The SARS-CoV-2 RNA is generally detectable in upper respiratory specimens during the acute phase of infection. The  lowest concentration of SARS-CoV-2 viral copies this assay can detect is 138 copies/mL. A negative result does not preclude SARS-Cov-2 infection and should not be used as the sole basis for treatment or other patient management decisions. A negative result may occur with  improper specimen collection/handling, submission of specimen other than nasopharyngeal swab, presence of viral mutation(s) within the areas targeted by this assay, and inadequate number  of viral copies(<138 copies/mL). A negative result must be combined with clinical observations, patient history, and epidemiological information. The expected result is Negative.  Fact Sheet for Patients:  EntrepreneurPulse.com.au  Fact Sheet for Healthcare Providers:  IncredibleEmployment.be  This test is no t yet approved or cleared by the [place] FDA and  has been authorized for detection and/or diagnosis of SARS-CoV-2 by FDA under an Emergency Use Authorization (EUA). This EUA will remain  in effect (meaning this test can be used) for the duration of the COVID-19 declaration under Section 564(b)(1) of the Act, 21 U.S.C.section 360bbb-3(b)(1), unless the authorization is terminated  or revoked sooner.       Influenza A by PCR NEGATIVE NEGATIVE Final   Influenza B by PCR NEGATIVE NEGATIVE Final    Comment: (NOTE) The Xpert Xpress SARS-CoV-2/FLU/RSV plus assay is intended as an aid in the diagnosis of influenza from Nasopharyngeal swab specimens and should not be used as a sole basis for treatment. Nasal washings and aspirates are unacceptable for Xpert Xpress SARS-CoV-2/FLU/RSV testing.  Fact Sheet for Patients: EntrepreneurPulse.com.au  Fact Sheet for Healthcare Providers: IncredibleEmployment.be  This test is not yet approved or cleared by the [place] FDA and has been authorized for detection and/or diagnosis of SARS-CoV-2 by FDA under  an Emergency Use Authorization (EUA). This EUA will remain in effect (meaning this test can be used) for the duration of the COVID-19 declaration under Section 564(b)(1) of the Act, 21 U.S.C. section 360bbb-3(b)(1), unless the authorization is terminated or revoked.  Performed at [facility], [address], [place], [place] [address]   Expectorated Sputum Assessment w Gram Stain, Rflx to Resp Cult     Status: None   Collection Time: 05/10/21  2:26 AM   Specimen: Expectorated Sputum  Result Value Ref Range Status   Specimen Description   Final    EXPECTORATED SPUTUM Performed at [facility], [address] [place], [place] [address]    Special Requests   Final    NONE Performed at [facility], [address], [place], [place] [address]    Sputum evaluation   Final    THIS SPECIMEN IS ACCEPTABLE FOR SPUTUM CULTURE Performed at [facility], [place] [address], [place], [place] [address]    Report Status 05/10/2021 FINAL  Final  Culture, Respiratory w Gram Stain     Status: None   Collection Time: 05/10/21  2:26 AM  Result Value Ref Range Status   Specimen Description EXPECTORATED SPUTUM  Final   Special Requests NONE Reflexed from [id number]  Final   Gram Stain   Final    FEW WBC PRESENT,BOTH PMN AND MONONUCLEAR FEW GRAM NEGATIVE RODS FEW GRAM VARIABLE ROD RARE GRAM POSITIVE COCCI    Culture   Final    FEW Normal respiratory flora-no Staph aureus or Pseudomonas seen Performed at [facility], [place] [address], [place], [place] [address]    Report Status 05/13/2021 FINAL  Final  Culture, blood (Routine X 2) w Reflex to ID Panel     Status: None   Collection Time: 05/10/21  3:40 AM   Specimen: Right Antecubital; Blood  Result Value Ref Range Status   Specimen Description RIGHT ANTECUBITAL  Final   Special Requests   Final    BOTTLES DRAWN AEROBIC AND ANAEROBIC Blood Culture adequate volume   Culture   Final    NO GROWTH 5 DAYS Performed at [facility], [address], [place], [place] [address]  Report Status 05/15/2021 FINAL  Final  Culture, blood (Routine X 2) w Reflex to ID Panel     Status: None   Collection Time: 05/10/21  3:48 AM   Specimen: BLOOD RIGHT HAND  Result Value Ref Range Status   Specimen Description BLOOD RIGHT HAND  Final   Special Requests   Final    BOTTLES DRAWN AEROBIC ONLY Blood Culture adequate volume   Culture   Final    NO GROWTH 5 DAYS Performed at [facility], [address] [place], [place] [address]    Report Status 05/15/2021 FINAL  Final  C Difficile Quick Screen w PCR reflex     Status: None   Collection Time: 05/10/21 11:56 AM   Specimen: STOOL  Result Value Ref Range Status   C Diff antigen NEGATIVE NEGATIVE Final   C Diff toxin NEGATIVE NEGATIVE Final   C Diff interpretation No C. difficile detected.  Final    Comment: Performed at [facility], [address] [place], [place] [address]  Resp Panel by RT-PCR (Flu A&B, Covid) Nasopharyngeal Swab     Status: None   Collection Time: 05/13/21 10:10 AM   Specimen: Nasopharyngeal Swab; Nasopharyngeal(NP) swabs in vial transport medium  Result Value Ref Range Status   SARS Coronavirus 2 by RT PCR NEGATIVE NEGATIVE Final    Comment: (NOTE) SARS-CoV-2 target nucleic acids are NOT DETECTED.  The SARS-CoV-2 RNA is generally detectable in upper respiratory specimens during the acute phase of infection. The lowest concentration of SARS-CoV-2 viral copies this assay can detect is 138 copies/mL. A negative result does not preclude SARS-Cov-2 infection and should not be used as the sole basis for treatment or other patient management decisions. A negative result may occur with  improper specimen collection/handling, submission of specimen other than nasopharyngeal swab, presence of viral mutation(s) within the areas targeted by this assay, and inadequate number of viral copies(<138 copies/mL). A negative result must be combined with clinical observations,  patient history, and epidemiological information. The expected result is Negative.  Fact Sheet for Patients:  EntrepreneurPulse.com.au  Fact Sheet for Healthcare Providers:  IncredibleEmployment.be  This test is no t yet approved or cleared by the [place] FDA and  has been authorized for detection and/or diagnosis of SARS-CoV-2 by FDA under an Emergency Use Authorization (EUA). This EUA will remain  in effect (meaning this test can be used) for the duration of the COVID-19 declaration under Section 564(b)(1) of the Act, 21 U.S.C.section 360bbb-3(b)(1), unless the authorization is terminated  or revoked sooner.       Influenza A by PCR NEGATIVE NEGATIVE Final   Influenza B by PCR NEGATIVE NEGATIVE Final    Comment: (NOTE) The Xpert Xpress SARS-CoV-2/FLU/RSV plus assay is intended as an aid in the diagnosis of influenza from Nasopharyngeal swab specimens and should not be used as a sole basis for treatment. Nasal washings and aspirates are unacceptable for Xpert Xpress SARS-CoV-2/FLU/RSV testing.  Fact Sheet for Patients: EntrepreneurPulse.com.au  Fact Sheet for Healthcare Providers: IncredibleEmployment.be  This test is not yet approved or cleared by the [place] FDA and has been authorized for detection and/or diagnosis of SARS-CoV-2 by FDA under an Emergency Use Authorization (EUA). This EUA will remain in effect (meaning this test can be used) for the duration of the COVID-19 declaration under Section 564(b)(1) of the Act, 21 U.S.C. section 360bbb-3(b)(1), unless the authorization is terminated or revoked.  Performed at [facility], [address] [place], [place] [address]  Labs: BNP (last 3 results) No results for input(s): BNP in the last 8760 hours. Basic Metabolic Panel: Recent Labs  Lab 05/09/21 1633 05/10/21 0340 05/11/21 0600  NA 137 138 140  K 4.5 4.4 4.4  CL 104  106 106  CO2 27 25 26   GLUCOSE 101* 103* 96  BUN 27* 23 19  CREATININE 1.28* 1.17* 1.15*  CALCIUM 10.4* 10.1 10.5*  MG  --  2.0  --   PHOS  --  2.9  --    Liver Function Tests: Recent Labs  Lab 05/09/21 1633 05/10/21 0340  AST 19 20  ALT 19 18  ALKPHOS 97 88  BILITOT 0.6 0.8  PROT 7.5 6.9  ALBUMIN 4.5 4.0   No results for input(s): LIPASE, AMYLASE in the last 168 hours. No results for input(s): AMMONIA in the last 168 hours. CBC: Recent Labs  Lab 05/09/21 1633 05/10/21 0340 05/11/21 0600  WBC 7.2 9.4 6.6  NEUTROABS 3.7  --   --   HGB 14.9 14.2 15.5*  HCT 42.8 41.2 45.4  MCV 105.2* 104.0* 104.1*  PLT 237 199 150  Urinalysis    Component Value Date/Time   COLORURINE STRAW (A) 12/23/2020 1621   APPEARANCEUR CLEAR 12/23/2020 1621   APPEARANCEUR Turbid (A) 11/21/2020 1430   LABSPEC 1.010 12/23/2020 1621   PHURINE 6.0 12/23/2020 1621   GLUCOSEU NEGATIVE 12/23/2020 1621   GLUCOSEU NEG mg/dL 09/20/2007 1114   HGBUR NEGATIVE 12/23/2020 1621   HGBUR moderate 06/04/2010 1425   BILIRUBINUR NEGATIVE 12/23/2020 1621   BILIRUBINUR Negative 11/21/2020 1430   KETONESUR NEGATIVE 12/23/2020 1621   PROTEINUR 30 (A) 12/23/2020 1621   UROBILINOGEN 1.0 05/31/2014 0933   NITRITE NEGATIVE 12/23/2020 1621   LEUKOCYTESUR NEGATIVE 12/23/2020 1621   Sepsis Labs Invalid input(s): PROCALCITONIN,  WBC,  LACTICIDVEN Microbiology Recent Results (from the past 240 hour(s))  Resp Panel by RT-PCR (Flu A&B, Covid) Nasopharyngeal Swab     Status: None   Collection Time: 05/09/21 10:00 PM   Specimen: Nasopharyngeal Swab; Nasopharyngeal(NP) swabs in vial transport medium  Result Value Ref Range Status   SARS Coronavirus 2 by RT PCR NEGATIVE NEGATIVE Final    Comment: (NOTE) SARS-CoV-2 target nucleic acids are NOT DETECTED.  The SARS-CoV-2 RNA is generally detectable in upper respiratory specimens during the acute phase of infection. The lowest concentration of SARS-CoV-2 viral copies this  assay can detect is 138 copies/mL. A negative result does not preclude SARS-Cov-2 infection and should not be used as the sole basis for treatment or other patient management decisions. A negative result may occur with  improper specimen collection/handling, submission of specimen other than nasopharyngeal swab, presence of viral mutation(s) within the areas targeted by this assay, and inadequate number of viral copies(<138 copies/mL). A negative result must be combined with clinical observations, patient history, and epidemiological information. The expected result is Negative.  Fact Sheet for Patients:  EntrepreneurPulse.com.au  Fact Sheet for Healthcare Providers:  IncredibleEmployment.be  This test is no t yet approved or cleared by the [place] FDA and  has been authorized for detection and/or diagnosis of SARS-CoV-2 by FDA under an Emergency Use Authorization (EUA). This EUA will remain  in effect (meaning this test can be used) for the duration of the COVID-19 declaration under Section 564(b)(1) of the Act, 21 U.S.C.section 360bbb-3(b)(1), unless the authorization is terminated  or revoked sooner.       Influenza A by PCR NEGATIVE NEGATIVE Final   Influenza B by PCR NEGATIVE NEGATIVE  Final    Comment: (NOTE) The Xpert Xpress SARS-CoV-2/FLU/RSV plus assay is intended as an aid in the diagnosis of influenza from Nasopharyngeal swab specimens and should not be used as a sole basis for treatment. Nasal washings and aspirates are unacceptable for Xpert Xpress SARS-CoV-2/FLU/RSV testing.  Fact Sheet for Patients: EntrepreneurPulse.com.au  Fact Sheet for Healthcare Providers: IncredibleEmployment.be  This test is not yet approved or cleared by the [place] FDA and has been authorized for detection and/or diagnosis of SARS-CoV-2 by FDA under an Emergency Use Authorization (EUA). This EUA will  remain in effect (meaning this test can be used) for the duration of the COVID-19 declaration under Section 564(b)(1) of the Act, 21 U.S.C. section 360bbb-3(b)(1), unless the authorization is terminated or revoked.  Performed at [facility], [address], [place], [place] [address]   Expectorated Sputum Assessment w Gram Stain, Rflx to Resp Cult     Status: None   Collection Time: 05/10/21  2:26 AM   Specimen: Expectorated Sputum  Result Value Ref Range Status   Specimen Description   Final    EXPECTORATED SPUTUM Performed at [facility], [address], [place], [place] [address]    Special Requests   Final    NONE Performed at [facility], [address], [place], [place] [address]    Sputum evaluation   Final    THIS SPECIMEN IS ACCEPTABLE FOR SPUTUM CULTURE Performed at [facility], [place] [address], [place], [place] [address]    Report Status 05/10/2021 FINAL  Final  Culture, Respiratory w Gram Stain     Status: None   Collection Time: 05/10/21  2:26 AM  Result Value Ref Range Status   Specimen Description EXPECTORATED SPUTUM  Final   Special Requests NONE Reflexed from [id number]  Final   Gram Stain   Final    FEW WBC PRESENT,BOTH PMN AND MONONUCLEAR FEW GRAM NEGATIVE RODS FEW GRAM VARIABLE ROD RARE GRAM POSITIVE COCCI    Culture   Final    FEW Normal respiratory flora-no Staph aureus or Pseudomonas seen Performed at [facility], [place] [address], [place], [place] [address]    Report Status 05/13/2021 FINAL  Final  Culture, blood (Routine X 2) w Reflex to ID Panel     Status: None   Collection Time: 05/10/21  3:40 AM   Specimen: Right Antecubital; Blood  Result Value Ref Range Status   Specimen Description RIGHT ANTECUBITAL  Final   Special Requests   Final    BOTTLES DRAWN AEROBIC AND ANAEROBIC Blood Culture adequate volume   Culture   Final    NO GROWTH 5 DAYS Performed at [facility], [address], [place], [place] [address]    Report Status  05/15/2021 FINAL  Final  Culture, blood (Routine X 2) w Reflex to ID Panel     Status: None   Collection Time: 05/10/21  3:48 AM   Specimen: BLOOD RIGHT HAND  Result Value Ref Range Status   Specimen Description BLOOD RIGHT HAND  Final   Special Requests   Final    BOTTLES DRAWN AEROBIC ONLY Blood Culture adequate volume   Culture   Final    NO GROWTH 5 DAYS Performed at [facility], [address] [place], [place] [address]    Report Status 05/15/2021 FINAL  Final  C Difficile Quick Screen w PCR reflex     Status: None   Collection Time: 05/10/21 11:56 AM   Specimen: STOOL  Result  Value Ref Range Status   C Diff antigen NEGATIVE NEGATIVE Final   C Diff toxin NEGATIVE NEGATIVE Final   C Diff interpretation No C. difficile detected.  Final    Comment: Performed at [facility], [address] [place], [place] [address]  Resp Panel by RT-PCR (Flu A&B, Covid) Nasopharyngeal Swab     Status: None   Collection Time: 05/13/21 10:10 AM   Specimen: Nasopharyngeal Swab; Nasopharyngeal(NP) swabs in vial transport medium  Result Value Ref Range Status   SARS Coronavirus 2 by RT PCR NEGATIVE NEGATIVE Final    Comment: (NOTE) SARS-CoV-2 target nucleic acids are NOT DETECTED.  The SARS-CoV-2 RNA is generally detectable in upper respiratory specimens during the acute phase of infection. The lowest concentration of SARS-CoV-2 viral copies this assay can detect is 138 copies/mL. A negative result does not preclude SARS-Cov-2 infection and should not be used as the sole basis for treatment or other patient management decisions. A negative result may occur with  improper specimen collection/handling, submission of specimen other than nasopharyngeal swab, presence of viral mutation(s) within the areas targeted by this assay, and inadequate number of viral copies(<138 copies/mL). A negative result must be combined with clinical observations, patient history, and epidemiological information. The  expected result is Negative.  Fact Sheet for Patients:  EntrepreneurPulse.com.au  Fact Sheet for Healthcare Providers:  IncredibleEmployment.be  This test is no t yet approved or cleared by the [place] FDA and  has been authorized for detection and/or diagnosis of SARS-CoV-2 by FDA under an Emergency Use Authorization (EUA). This EUA will remain  in effect (meaning this test can be used) for the duration of the COVID-19 declaration under Section 564(b)(1) of the Act, 21 U.S.C.section 360bbb-3(b)(1), unless the authorization is terminated  or revoked sooner.       Influenza A by PCR NEGATIVE NEGATIVE Final   Influenza B by PCR NEGATIVE NEGATIVE Final    Comment: (NOTE) The Xpert Xpress SARS-CoV-2/FLU/RSV plus assay is intended as an aid in the diagnosis of influenza from Nasopharyngeal swab specimens and should not be used as a sole basis for treatment. Nasal washings and aspirates are unacceptable for Xpert Xpress SARS-CoV-2/FLU/RSV testing.  Fact Sheet for Patients: EntrepreneurPulse.com.au  Fact Sheet for Healthcare Providers: IncredibleEmployment.be  This test is not yet approved or cleared by the [place] FDA and has been authorized for detection and/or diagnosis of SARS-CoV-2 by FDA under an Emergency Use Authorization (EUA). This EUA will remain in effect (meaning this test can be used) for the duration of the COVID-19 declaration under Section 564(b)(1) of the Act, 21 U.S.C. section 360bbb-3(b)(1), unless the authorization is terminated or revoked.  Performed at [facility], [address] [place], [place] [address]      Patient was seen and examined on the day of discharge and was found to be in stable condition. Time coordinating discharge: 35 minutes including assessment and coordination of care, as well as examination of the patient.   SIGNED:  Deatra James, DO [facility] 05/15/2021, 12:37 PM

## 2021-05-16 DIAGNOSIS — I10 Essential (primary) hypertension: Secondary | ICD-10-CM | POA: Diagnosis not present

## 2021-05-16 DIAGNOSIS — R1319 Other dysphagia: Secondary | ICD-10-CM | POA: Diagnosis not present

## 2021-05-16 DIAGNOSIS — J189 Pneumonia, unspecified organism: Secondary | ICD-10-CM | POA: Diagnosis not present

## 2021-05-16 DIAGNOSIS — J449 Chronic obstructive pulmonary disease, unspecified: Secondary | ICD-10-CM | POA: Diagnosis not present

## 2021-05-16 LAB — SARS CORONAVIRUS 2 BY RT PCR (HOSPITAL ORDER, PERFORMED IN ~~LOC~~ HOSPITAL LAB): SARS Coronavirus 2: NEGATIVE

## 2021-05-16 NOTE — TOC Progression Note (Signed)
Transition of Care [facility]) - Progression Note    Patient Details  Name: [name] MRN: [id number] Date of Birth: 1941/07/18  Transition of Care [facility] CM/SW Contact  Natasha Bence, LCSW Phone Number: 05/16/2021, 3:30 PM  Clinical Narrative:    CSW obtained patient's PASSR. [facility] agreeable to take patient. Due to late Covid test resulting, [facility] Spies reported that they will have to take patient on 05/17/21. Insurance Josem Kaufmann is approved until 05/20/21. TOC to follow.   Expected Discharge Plan: Skilled Nursing Facility Barriers to Discharge: Barriers Resolved  Expected Discharge Plan and Services Expected Discharge Plan: [place]         Expected Discharge Date: 05/14/21                                     Social Determinants of Health (SDOH) Interventions    Readmission Risk Interventions Readmission Risk Prevention Plan 05/11/2021  Transportation Screening Complete  PCP or Specialist Appt within 3-5 Days Complete  HRI or [place] Complete  Social Work Consult for [place] Planning/Counseling Complete  Palliative Care Screening Not Applicable  Medication Review Press photographer) Complete  Some recent data might be hidden

## 2021-05-16 NOTE — Progress Notes (Signed)
Physician Discharge Summary  [name] OAC:[id number] DOB: 07-05-1941 DOA: 05/09/2021  PCP: Wannetta Sender, FNP  Admit date: 05/09/2021 Discharge date: 05/16/2021  Admitted From: Home Disposition:  SNF   Recommendations for Outpatient Follow-up:  Follow up with PCP in 1 week  Discharge Condition:  The patient was seen and examined this morning, stable no acute distress, blood pressure has much improved with change in medication to labetalol Continues to have generalized weaknesses, pending discharge to SNF  ready for discharge to SNF pending PASRR  The patient was seen and examined, remained stable   CODE STATUS: Full  Diet recommendation:  Diet Orders (From admission, onward)     Start     Ordered   05/14/21 0000  Diet - low sodium heart healthy        05/14/21 1058   05/10/21 1806  DIET DYS 3 Room service appropriate? Yes; Fluid consistency: Thin  Diet effective now       Question Answer Comment  Room service appropriate? Yes   Fluid consistency: Thin      05/10/21 1805           Brief/Interim Summary: [name] is an 80 y.o. adult with medical history significant for peripheral neuropathy, DDD, depression, COPD, CKD stage IIIB, hypertension, HIV infection presents to the emergency department due to 4-week onset of cough, sore throat, subjective fever, intermittent diaphoresis and generalized weakness to the extent that patient has difficulty in being able to ambulate, she states that she has been mostly bedbound/recliner bound since onset of symptoms about 4 weeks ago.  Patient states that she was given 2 different antibiotics, but she was unable to remember the names.  She endorsed occasional choking on food/drink.  Patient states that her PCP was trying to place at [facility] for rehab so as to get her strength back.  Patient lives with a roommate.  She was treated with IV antibiotics, sputum culture with normal flora. Patient was evaluated by SLP,  PT, OT during hospitalization.   Discharge Diagnoses:  Principal Problem:   CAP (community acquired pneumonia) Active Problems:   HIV disease ([place])   Esophageal dysphagia   Essential hypertension   CKD (chronic kidney disease) stage 3, GFR 30-59 ml/min (HCC)   COPD (chronic obstructive pulmonary disease) (HCC)   Generalized weakness   Elevated MCV  Community-acquired pneumonia -Failed outpatient treatment -CT chest with suggestion of bilateral upper lobe infiltrates, multifocal pneumonia -Continue ceftriaxone, azithromycin, flagyl --> Augmentin on discharge  -Sputum culture with normal flora  -Persistent rhonchi, mucolytic with scheduled, continue pulmonary toiletry   Generalized weakness, frailty -PT OT evaluation recommending SNF placement   Dysphagia -SLP evaluation recommending dysphagia 3 diet   Hypertension -Hypertensive overnight -Continue losartan, Coreg was discontinued, substituted for labetalol for better blood pressure control  CKD stage IIIa -Baseline creatinine 1.2-1.7 -Stable   HIV -Followed by Dr. [name] -Continue Triumeq   History of schizophrenia -Has had visual and auditory hallucinations earlier this year, went to [facility] geripsych March 2022    Diarrhea -C. difficile negative -Resolved      Discharge Instructions  Discharge Instructions     Activity as tolerated - No restrictions   Complete by: As directed    Diet - low sodium heart healthy   Complete by: As directed    Increase activity slowly   Complete by: As directed    Increase activity slowly   Complete by: As directed  Allergies as of 05/16/2021       Reactions   Bee Venom Anaphylaxis   Promethazine Swelling   Tenofovir Disoproxil Fumarate [tenofovir Disoproxil Fumarate] Other (See Comments)   Renal failure, (08/27/2006)   Compazine  [prochlorperazine Edisylate] Nausea And Vomiting   Haloperidol Lactate Other (See Comments)   Reaction is muscle tension,  causes severe spasms in face and neck. Forces eyes to roll in the back of the head   Sulfa Antibiotics Diarrhea   Brompheniramine-acetaminophen    Chlorcyclizine    Chlorpromazine    Codeine Itching, Nausea And Vomiting   Navane [thiothixene] Other (See Comments), Nausea And Vomiting   Same muscle spasm reaction as haldol   Prednisone    Brief mild psychosis and agitation   Promethazine Hcl Other (See Comments)   Propoxyphene N-acetaminophen Itching, Nausea And Vomiting   Morphine Itching, Swelling        Medication List     STOP taking these medications    carvedilol 6.25 MG tablet Commonly known as: COREG   LORazepam 0.5 MG tablet Commonly known as: ATIVAN   nebivolol 5 MG tablet Commonly known as: Bystolic   prazosin 1 MG capsule Commonly known as: MINIPRESS       TAKE these medications    acetaminophen 500 MG tablet Commonly known as: TYLENOL Take 2 tablets (1,000 mg total) by mouth every 6 (six) hours as needed for mild pain or moderate pain.   albuterol 108 (90 Base) MCG/ACT inhaler Commonly known as: VENTOLIN HFA Inhale 1-2 puffs into the lungs every 6 (six) hours as needed for wheezing or shortness of breath.   Allergy Relief 10 MG tablet Generic drug: loratadine Take 10 mg by mouth daily.   amoxicillin-clavulanate 875-125 MG tablet Commonly known as: Augmentin Take 1 tablet by mouth 2 (two) times daily for 5 days.   clonazePAM 1 MG tablet Commonly known as: KLONOPIN Take 1 tablet (1 mg total) by mouth 2 (two) times daily as needed for anxiety.   diphenhydrAMINE 25 mg capsule Commonly known as: BENADRYL Take 25 mg by mouth daily as needed for itching (itching from codeine).   docusate sodium 100 MG capsule Commonly known as: COLACE Take 1 capsule (100 mg total) by mouth 2 (two) times daily.   DULoxetine 60 MG capsule Commonly known as: CYMBALTA Take 60 mg by mouth daily. What changed: Another medication with the same name was removed.  Continue taking this medication, and follow the directions you see here.   Fish Oil Burp-Less 1000 MG Caps Take 1 capsule by mouth daily.   fluticasone 50 MCG/ACT nasal spray Commonly known as: FLONASE 2 SPRAYS INTO BOTH NOSTRILS AT BEDTIME. What changed: See the new instructions.   furosemide 40 MG tablet Commonly known as: LASIX Take 1 tablet (40 mg total) by mouth daily as needed for fluid. Hold until follow up with PCP   guaiFENesin-dextromethorphan 100-10 MG/5ML syrup Commonly known as: ROBITUSSIN DM Take 10 mLs by mouth every 8 (eight) hours for 10 days.   haloperidol 10 MG tablet Commonly known as: HALDOL Take 10 mg by mouth at bedtime.   ipratropium-albuterol 0.5-2.5 (3) MG/3ML Soln Commonly known as: DUONEB Inhale 3 mLs into the lungs every 6 (six) hours as needed (shortness of breath).   labetalol 200 MG tablet Commonly known as: NORMODYNE Take 1 tablet (200 mg total) by mouth 2 (two) times daily.   losartan 25 MG tablet Commonly known as: COZAAR Take 12.5 mg by mouth daily. What changed: Another  medication with the same name was removed. Continue taking this medication, and follow the directions you see here.   magnesium 30 MG tablet Take 30 mg by mouth at bedtime.   nystatin powder Commonly known as: MYCOSTATIN/NYSTOP Apply 1 application topically 3 (three) times daily. Apply to groin region until redness and irritation improving   ondansetron 8 MG tablet Commonly known as: ZOFRAN Take 8 mg by mouth daily as needed for nausea or vomiting.   ONE-A-DAY 55 PLUS PO Take 1 tablet by mouth daily.   pantoprazole 40 MG tablet Commonly known as: Protonix Take 1 tablet (40 mg total) by mouth daily before breakfast.   polyvinyl alcohol 1.4 % ophthalmic solution Commonly known as: LIQUIFILM TEARS Place 1 drop into both eyes daily.   psyllium 95 % Pack Commonly known as: HYDROCIL/METAMUCIL Take 1 packet by mouth daily.   thiamine 100 MG tablet Commonly  known as: Vitamin B-1 Take 100 mg by mouth daily.   Triumeq 600-50-300 MG tablet Generic drug: abacavir-dolutegravir-lamiVUDine Take 1 tablet by mouth daily.   Vitamin D3 250 MCG (10000 UT) Tabs Take 1 tablet by mouth daily.   Xtampza ER 13.5 MG C12a Generic drug: oxyCODONE ER Take 1 capsule by mouth 2 (two) times daily.         Contact information for follow-up providers     Wannetta Sender, FNP. Schedule an appointment as soon as possible for a visit in 1 week(s).   Specialty: Family Medicine Contact information: [place] [facility] [address] [place] [address] [phone]              Contact information for after-discharge care     Destination     [facility] .   Service: Skilled Nursing Contact information: [place] [address] [phone]                    Allergies  Allergen Reactions   Bee Venom Anaphylaxis   Promethazine Swelling   Tenofovir Disoproxil Fumarate [Tenofovir Disoproxil Fumarate] Other (See Comments)    Renal failure, (08/27/2006)   Compazine  [Prochlorperazine Edisylate] Nausea And Vomiting   Haloperidol Lactate Other (See Comments)    Reaction is muscle tension, causes severe spasms in face and neck. Forces eyes to roll in the back of the head   Sulfa Antibiotics Diarrhea   Brompheniramine-Acetaminophen    Chlorcyclizine    Chlorpromazine    Codeine Itching and Nausea And Vomiting   Navane [Thiothixene] Other (See Comments) and Nausea And Vomiting    Same muscle spasm reaction as haldol   Prednisone     Brief mild psychosis and agitation   Promethazine Hcl Other (See Comments)   Propoxyphene N-Acetaminophen Itching and Nausea And Vomiting   Morphine Itching and Swelling    Consultations: None    Procedures/Studies: DG Chest 2 View  Result Date: 05/09/2021 CLINICAL DATA:  Cough EXAM: CHEST - 2 VIEW COMPARISON:  12/26/2020,  12/23/2020 FINDINGS: A rounded density has developed within the right hilar region possibly representing vascular shadow, hilar adenopathy, or a perihilar pulmonary nodule. The lungs are otherwise clear. No pneumothorax or pleural effusion. Cardiac size within normal limits. Pulmonary vascularity is normal. No acute bone abnormality. IMPRESSION: No radiographic evidence of acute cardiopulmonary disease. Interval development of a rounded opacity within the right hilar region. See differential considerations above. This could be better assessed with dedicated contrast enhanced CT examination of the chest. Alternatively,  follow-up chest radiograph in 4-6 weeks may helpful in documenting resolution. Electronically Signed   By: Fidela Salisbury MD   On: 05/09/2021 17:50   CT Chest W Contrast  Result Date: 05/09/2021 CLINICAL DATA:  Weakness and cough for 3 weeks, wheezing, abnormal chest x-ray at right hilum EXAM: CT CHEST WITH CONTRAST TECHNIQUE: Multidetector CT imaging of the chest was performed during intravenous contrast administration. CONTRAST:  25mL OMNIPAQUE IOHEXOL 300 MG/ML  SOLN COMPARISON:  05/09/2021 FINDINGS: Cardiovascular: The heart is unremarkable without pericardial effusion. Prominent atherosclerosis of the distal aortic arch and descending thoracic aorta. No evidence of aneurysm or dissection. Mediastinum/Nodes: No enlarged mediastinal, hilar, or axillary lymph nodes. Thyroid gland, trachea, and esophagus demonstrate no significant findings. Lungs/Pleura: There are patchy bilateral areas of ground-glass airspace disease most pronounced within the suprahilar regions, which could reflect early edema or atypical viral infection such as COVID-19. No effusion or pneumothorax. The central airways are patent. Upper Abdomen: No acute abnormality. Musculoskeletal: No acute or destructive bony lesions. Reconstructed images demonstrate no additional findings. IMPRESSION: 1. Patchy upper lobe predominant  ground-glass airspace disease consistent with multifocal infection or early edema. 2. No abnormality at the right hilum to correspond to the chest x-ray finding. The rounded density at the right hilum on chest x-ray likely reflects a pulmonary vessel seen on end or clothing artifact. 3.  Aortic Atherosclerosis (ICD10-I70.0). Electronically Signed   By: Randa Ngo M.D.   On: 05/09/2021 21:08       Discharge Exam: Vitals:   05/15/21 2014 05/16/21 0457  BP: (!) 180/78 (!) 127/47  Pulse: 74 61  Resp: 18 19  Temp: 98.2 F (36.8 C) 98.3 F (36.8 C)  SpO2: 98% 98%      Physical Exam:   General:  Alert, oriented, cooperative, no distress;   HEENT:  Normocephalic, PERRL, otherwise with in Normal limits   Neuro:  CNII-XII intact. , normal motor and sensation, reflexes intact   Lungs:   Clear to auscultation BL, Respirations unlabored, scattered improved rhonchi, wheezing , no crackles  Cardio:    S1/S2, RRR, No murmure, No Rubs or Gallops   Abdomen:   Soft, non-tender, bowel sounds active all four quadrants,  no guarding or peritoneal signs.  Muscular skeletal:  Limited exam - in bed, able to move all 4 extremities, Normal strength,  2+ pulses,  symmetric, No pitting edema  Skin:  Dry, warm to touch, negative for any Rashes,  Wounds: Please see nursing documentation         The results of significant diagnostics from this hospitalization (including imaging, microbiology, ancillary and laboratory) are listed below for reference.     Microbiology: Recent Results (from the past 240 hour(s))  Resp Panel by RT-PCR (Flu A&B, Covid) Nasopharyngeal Swab     Status: None   Collection Time: 05/09/21 10:00 PM   Specimen: Nasopharyngeal Swab; Nasopharyngeal(NP) swabs in vial transport medium  Result Value Ref Range Status   SARS Coronavirus 2 by RT PCR NEGATIVE NEGATIVE Final    Comment: (NOTE) SARS-CoV-2 target nucleic acids are NOT DETECTED.  The SARS-CoV-2 RNA is generally detectable  in upper respiratory specimens during the acute phase of infection. The lowest concentration of SARS-CoV-2 viral copies this assay can detect is 138 copies/mL. A negative result does not preclude SARS-Cov-2 infection and should not be used as the sole basis for treatment or other patient management decisions. A negative result may occur with  improper specimen collection/handling, submission of specimen other than nasopharyngeal  swab, presence of viral mutation(s) within the areas targeted by this assay, and inadequate number of viral copies(<138 copies/mL). A negative result must be combined with clinical observations, patient history, and epidemiological information. The expected result is Negative.  Fact Sheet for Patients:  EntrepreneurPulse.com.au  Fact Sheet for Healthcare Providers:  IncredibleEmployment.be  This test is no t yet approved or cleared by the [place] FDA and  has been authorized for detection and/or diagnosis of SARS-CoV-2 by FDA under an Emergency Use Authorization (EUA). This EUA will remain  in effect (meaning this test can be used) for the duration of the COVID-19 declaration under Section 564(b)(1) of the Act, 21 U.S.C.section 360bbb-3(b)(1), unless the authorization is terminated  or revoked sooner.       Influenza A by PCR NEGATIVE NEGATIVE Final   Influenza B by PCR NEGATIVE NEGATIVE Final    Comment: (NOTE) The Xpert Xpress SARS-CoV-2/FLU/RSV plus assay is intended as an aid in the diagnosis of influenza from Nasopharyngeal swab specimens and should not be used as a sole basis for treatment. Nasal washings and aspirates are unacceptable for Xpert Xpress SARS-CoV-2/FLU/RSV testing.  Fact Sheet for Patients: EntrepreneurPulse.com.au  Fact Sheet for Healthcare Providers: IncredibleEmployment.be  This test is not yet approved or cleared by the [place] FDA and has  been authorized for detection and/or diagnosis of SARS-CoV-2 by FDA under an Emergency Use Authorization (EUA). This EUA will remain in effect (meaning this test can be used) for the duration of the COVID-19 declaration under Section 564(b)(1) of the Act, 21 U.S.C. section 360bbb-3(b)(1), unless the authorization is terminated or revoked.  Performed at [facility], [address], [place], [place] [address]   Expectorated Sputum Assessment w Gram Stain, Rflx to Resp Cult     Status: None   Collection Time: 05/10/21  2:26 AM   Specimen: Expectorated Sputum  Result Value Ref Range Status   Specimen Description   Final    EXPECTORATED SPUTUM Performed at [facility], [address] [place], [place] [address]    Special Requests   Final    NONE Performed at [facility], [address], [place], [place] [address]    Sputum evaluation   Final    THIS SPECIMEN IS ACCEPTABLE FOR SPUTUM CULTURE Performed at [facility], [place] [address] [place], [place] [address]    Report Status 05/10/2021 FINAL  Final  Culture, Respiratory w Gram Stain     Status: None   Collection Time: 05/10/21  2:26 AM  Result Value Ref Range Status   Specimen Description EXPECTORATED SPUTUM  Final   Special Requests NONE Reflexed from [phone]  Final   Gram Stain   Final    FEW WBC PRESENT,BOTH PMN AND MONONUCLEAR FEW GRAM NEGATIVE RODS FEW GRAM VARIABLE ROD RARE GRAM POSITIVE COCCI    Culture   Final    FEW Normal respiratory flora-no Staph aureus or Pseudomonas seen Performed at [facility], [place] [address], [place], [place] [address]    Report Status 05/13/2021 FINAL  Final  Culture, blood (Routine X 2) w Reflex to ID Panel     Status: None   Collection Time: 05/10/21  3:40 AM   Specimen: Right Antecubital; Blood  Result Value Ref Range Status   Specimen Description RIGHT ANTECUBITAL  Final   Special Requests   Final    BOTTLES DRAWN AEROBIC AND ANAEROBIC Blood Culture adequate volume    Culture   Final    NO GROWTH  5 DAYS Performed at [facility], [address] [place], [place] [address]    Report Status 05/15/2021 FINAL  Final  Culture, blood (Routine X 2) w Reflex to ID Panel     Status: None   Collection Time: 05/10/21  3:48 AM   Specimen: BLOOD RIGHT HAND  Result Value Ref Range Status   Specimen Description BLOOD RIGHT HAND  Final   Special Requests   Final    BOTTLES DRAWN AEROBIC ONLY Blood Culture adequate volume   Culture   Final    NO GROWTH 5 DAYS Performed at [facility], [address], [place], [place] [address]    Report Status 05/15/2021 FINAL  Final  C Difficile Quick Screen w PCR reflex     Status: None   Collection Time: 05/10/21 11:56 AM   Specimen: STOOL  Result Value Ref Range Status   C Diff antigen NEGATIVE NEGATIVE Final   C Diff toxin NEGATIVE NEGATIVE Final   C Diff interpretation No C. difficile detected.  Final    Comment: Performed at [facility], [address] [place],  [address]  Resp Panel by RT-PCR (Flu A&B, Covid) Nasopharyngeal Swab     Status: None   Collection Time: 05/13/21 10:10 AM   Specimen: Nasopharyngeal Swab; Nasopharyngeal(NP) swabs in vial transport medium  Result Value Ref Range Status   SARS Coronavirus 2 by RT PCR NEGATIVE NEGATIVE Final    Comment: (NOTE) SARS-CoV-2 target nucleic acids are NOT DETECTED.  The SARS-CoV-2 RNA is generally detectable in upper respiratory specimens during the acute phase of infection. The lowest concentration of SARS-CoV-2 viral copies this assay can detect is 138 copies/mL. A negative result does not preclude SARS-Cov-2 infection and should not be used as the sole basis for treatment or other patient management decisions. A negative result may occur with  improper specimen collection/handling, submission of specimen other than nasopharyngeal swab, presence of viral mutation(s) within the areas targeted by this assay, and inadequate number of viral copies(<138  copies/mL). A negative result must be combined with clinical observations, patient history, and epidemiological information. The expected result is Negative.  Fact Sheet for Patients:  EntrepreneurPulse.com.au  Fact Sheet for Healthcare Providers:  IncredibleEmployment.be  This test is no t yet approved or cleared by the [place] FDA and  has been authorized for detection and/or diagnosis of SARS-CoV-2 by FDA under an Emergency Use Authorization (EUA). This EUA will remain  in effect (meaning this test can be used) for the duration of the COVID-19 declaration under Section 564(b)(1) of the Act, 21 U.S.C.section 360bbb-3(b)(1), unless the authorization is terminated  or revoked sooner.       Influenza A by PCR NEGATIVE NEGATIVE Final   Influenza B by PCR NEGATIVE NEGATIVE Final    Comment: (NOTE) The Xpert Xpress SARS-CoV-2/FLU/RSV plus assay is intended as an aid in the diagnosis of influenza from Nasopharyngeal swab specimens and should not be used as a sole basis for treatment. Nasal washings and aspirates are unacceptable for Xpert Xpress SARS-CoV-2/FLU/RSV testing.  Fact Sheet for Patients: EntrepreneurPulse.com.au  Fact Sheet for Healthcare Providers: IncredibleEmployment.be  This test is not yet approved or cleared by the [place] FDA and has been authorized for detection and/or diagnosis of SARS-CoV-2 by FDA under an Emergency Use Authorization (EUA). This EUA will remain in effect (meaning this test can be used) for the duration of the COVID-19 declaration under Section 564(b)(1) of the Act, 21 U.S.C. section 360bbb-3(b)(1), unless the authorization is terminated  or revoked.  Performed at [facility], [address] [place], [place] [address]      Labs: BNP (last 3 results) No results for input(s): BNP in the last 8760 hours. Basic Metabolic Panel: Recent Labs  Lab  05/09/21 1633 05/10/21 0340 05/11/21 0600  NA 137 138 140  K 4.5 4.4 4.4  CL 104 106 106  CO2 27 25 26   GLUCOSE 101* 103* 96  BUN 27* 23 19  CREATININE 1.28* 1.17* 1.15*  CALCIUM 10.4* 10.1 10.5*  MG  --  2.0  --   PHOS  --  2.9  --    Liver Function Tests: Recent Labs  Lab 05/09/21 1633 05/10/21 0340  AST 19 20  ALT 19 18  ALKPHOS 97 88  BILITOT 0.6 0.8  PROT 7.5 6.9  ALBUMIN 4.5 4.0   No results for input(s): LIPASE, AMYLASE in the last 168 hours. No results for input(s): AMMONIA in the last 168 hours. CBC: Recent Labs  Lab 05/09/21 1633 05/10/21 0340 05/11/21 0600  WBC 7.2 9.4 6.6  NEUTROABS 3.7  --   --   HGB 14.9 14.2 15.5*  HCT 42.8 41.2 45.4  MCV 105.2* 104.0* 104.1*  PLT 237 199 150  Urinalysis    Component Value Date/Time   COLORURINE STRAW (A) 12/23/2020 1621   APPEARANCEUR CLEAR 12/23/2020 1621   APPEARANCEUR Turbid (A) 11/21/2020 1430   LABSPEC 1.010 12/23/2020 1621   PHURINE 6.0 12/23/2020 1621   GLUCOSEU NEGATIVE 12/23/2020 1621   GLUCOSEU NEG mg/dL 09/20/2007 1114   HGBUR NEGATIVE 12/23/2020 1621   HGBUR moderate 06/04/2010 1425   BILIRUBINUR NEGATIVE 12/23/2020 1621   BILIRUBINUR Negative 11/21/2020 1430   KETONESUR NEGATIVE 12/23/2020 1621   PROTEINUR 30 (A) 12/23/2020 1621   UROBILINOGEN 1.0 05/31/2014 0933   NITRITE NEGATIVE 12/23/2020 1621   LEUKOCYTESUR NEGATIVE 12/23/2020 1621   Sepsis Labs Invalid input(s): PROCALCITONIN,  WBC,  LACTICIDVEN Microbiology Recent Results (from the past 240 hour(s))  Resp Panel by RT-PCR (Flu A&B, Covid) Nasopharyngeal Swab     Status: None   Collection Time: 05/09/21 10:00 PM   Specimen: Nasopharyngeal Swab; Nasopharyngeal(NP) swabs in vial transport medium  Result Value Ref Range Status   SARS Coronavirus 2 by RT PCR NEGATIVE NEGATIVE Final    Comment: (NOTE) SARS-CoV-2 target nucleic acids are NOT DETECTED.  The SARS-CoV-2 RNA is generally detectable in upper respiratory specimens during  the acute phase of infection. The lowest concentration of SARS-CoV-2 viral copies this assay can detect is 138 copies/mL. A negative result does not preclude SARS-Cov-2 infection and should not be used as the sole basis for treatment or other patient management decisions. A negative result may occur with  improper specimen collection/handling, submission of specimen other than nasopharyngeal swab, presence of viral mutation(s) within the areas targeted by this assay, and inadequate number of viral copies(<138 copies/mL). A negative result must be combined with clinical observations, patient history, and epidemiological information. The expected result is Negative.  Fact Sheet for Patients:  EntrepreneurPulse.com.au  Fact Sheet for Healthcare Providers:  IncredibleEmployment.be  This test is no t yet approved or cleared by the [place] FDA and  has been authorized for detection and/or diagnosis of SARS-CoV-2 by FDA under an Emergency Use Authorization (EUA). This EUA will remain  in effect (meaning this test can be used) for the duration of the COVID-19 declaration under Section 564(b)(1) of the Act, 21 U.S.C.section 360bbb-3(b)(1), unless the authorization is terminated  or revoked sooner.  Influenza A by PCR NEGATIVE NEGATIVE Final   Influenza B by PCR NEGATIVE NEGATIVE Final    Comment: (NOTE) The Xpert Xpress SARS-CoV-2/FLU/RSV plus assay is intended as an aid in the diagnosis of influenza from Nasopharyngeal swab specimens and should not be used as a sole basis for treatment. Nasal washings and aspirates are unacceptable for Xpert Xpress SARS-CoV-2/FLU/RSV testing.  Fact Sheet for Patients: EntrepreneurPulse.com.au  Fact Sheet for Healthcare Providers: IncredibleEmployment.be  This test is not yet approved or cleared by the [place] FDA and has been authorized for detection and/or  diagnosis of SARS-CoV-2 by FDA under an Emergency Use Authorization (EUA). This EUA will remain in effect (meaning this test can be used) for the duration of the COVID-19 declaration under Section 564(b)(1) of the Act, 21 U.S.C. section 360bbb-3(b)(1), unless the authorization is terminated or revoked.  Performed at [facility], [address] [place], [place] [address]   Expectorated Sputum Assessment w Gram Stain, Rflx to Resp Cult     Status: None   Collection Time: 05/10/21  2:26 AM   Specimen: Expectorated Sputum  Result Value Ref Range Status   Specimen Description   Final    EXPECTORATED SPUTUM Performed at [facility], [address] [place], [place] [address]    Special Requests   Final    NONE Performed at [facility], [address] [place], [place] [address]    Sputum evaluation   Final    THIS SPECIMEN IS ACCEPTABLE FOR SPUTUM CULTURE Performed at [facility], [place] [address], [place], [place] [address]    Report Status 05/10/2021 FINAL  Final  Culture, Respiratory w Gram Stain     Status: None   Collection Time: 05/10/21  2:26 AM  Result Value Ref Range Status   Specimen Description EXPECTORATED SPUTUM  Final   Special Requests NONE Reflexed from [id number]  Final   Gram Stain   Final    FEW WBC PRESENT,BOTH PMN AND MONONUCLEAR FEW GRAM NEGATIVE RODS FEW GRAM VARIABLE ROD RARE GRAM POSITIVE COCCI    Culture   Final    FEW Normal respiratory flora-no Staph aureus or Pseudomonas seen Performed at [facility], [place] [address], [place], [place] [address]    Report Status 05/13/2021 FINAL  Final  Culture, blood (Routine X 2) w Reflex to ID Panel     Status: None   Collection Time: 05/10/21  3:40 AM   Specimen: Right Antecubital; Blood  Result Value Ref Range Status   Specimen Description RIGHT ANTECUBITAL  Final   Special Requests   Final    BOTTLES DRAWN AEROBIC AND ANAEROBIC Blood Culture adequate volume   Culture   Final    NO GROWTH 5  DAYS Performed at [facility], [address], [place], [place] [address]    Report Status 05/15/2021 FINAL  Final  Culture, blood (Routine X 2) w Reflex to ID Panel     Status: None   Collection Time: 05/10/21  3:48 AM   Specimen: BLOOD RIGHT HAND  Result Value Ref Range Status   Specimen Description BLOOD RIGHT HAND  Final   Special Requests   Final    BOTTLES DRAWN AEROBIC ONLY Blood Culture adequate volume   Culture   Final    NO GROWTH 5 DAYS Performed at [facility], [address] [place], [place] [address]    Report Status 05/15/2021 FINAL  Final  C Difficile Quick Screen w PCR reflex  Status: None   Collection Time: 05/10/21 11:56 AM   Specimen: STOOL  Result Value Ref Range Status   C Diff antigen NEGATIVE NEGATIVE Final   C Diff toxin NEGATIVE NEGATIVE Final   C Diff interpretation No C. difficile detected.  Final    Comment: Performed at [facility], [address], [place], [place] [address]  Resp Panel by RT-PCR (Flu A&B, Covid) Nasopharyngeal Swab     Status: None   Collection Time: 05/13/21 10:10 AM   Specimen: Nasopharyngeal Swab; Nasopharyngeal(NP) swabs in vial transport medium  Result Value Ref Range Status   SARS Coronavirus 2 by RT PCR NEGATIVE NEGATIVE Final    Comment: (NOTE) SARS-CoV-2 target nucleic acids are NOT DETECTED.  The SARS-CoV-2 RNA is generally detectable in upper respiratory specimens during the acute phase of infection. The lowest concentration of SARS-CoV-2 viral copies this assay can detect is 138 copies/mL. A negative result does not preclude SARS-Cov-2 infection and should not be used as the sole basis for treatment or other patient management decisions. A negative result may occur with  improper specimen collection/handling, submission of specimen other than nasopharyngeal swab, presence of viral mutation(s) within the areas targeted by this assay, and inadequate number of viral copies(<138 copies/mL). A negative result must be  combined with clinical observations, patient history, and epidemiological information. The expected result is Negative.  Fact Sheet for Patients:  EntrepreneurPulse.com.au  Fact Sheet for Healthcare Providers:  IncredibleEmployment.be  This test is no t yet approved or cleared by the [place] FDA and  has been authorized for detection and/or diagnosis of SARS-CoV-2 by FDA under an Emergency Use Authorization (EUA). This EUA will remain  in effect (meaning this test can be used) for the duration of the COVID-19 declaration under Section 564(b)(1) of the Act, 21 U.S.C.section 360bbb-3(b)(1), unless the authorization is terminated  or revoked sooner.       Influenza A by PCR NEGATIVE NEGATIVE Final   Influenza B by PCR NEGATIVE NEGATIVE Final    Comment: (NOTE) The Xpert Xpress SARS-CoV-2/FLU/RSV plus assay is intended as an aid in the diagnosis of influenza from Nasopharyngeal swab specimens and should not be used as a sole basis for treatment. Nasal washings and aspirates are unacceptable for Xpert Xpress SARS-CoV-2/FLU/RSV testing.  Fact Sheet for Patients: EntrepreneurPulse.com.au  Fact Sheet for Healthcare Providers: IncredibleEmployment.be  This test is not yet approved or cleared by the [place] FDA and has been authorized for detection and/or diagnosis of SARS-CoV-2 by FDA under an Emergency Use Authorization (EUA). This EUA will remain in effect (meaning this test can be used) for the duration of the COVID-19 declaration under Section 564(b)(1) of the Act, 21 U.S.C. section 360bbb-3(b)(1), unless the authorization is terminated or revoked.  Performed at [facility], [address] [place], [place] [address]      Patient was seen and examined on the day of discharge and was found to be in stable condition. Time coordinating discharge: 35 minutes including assessment and coordination  of care, as well as examination of the patient.   SIGNED:  Deatra James, DO [facility] 05/16/2021, 10:30 AM

## 2021-05-16 NOTE — Discharge Summary (Signed)
Physician Discharge Summary  Surabhi Gadea Gibbs KDX:[id number] DOB: 1940/10/31 DOA: 05/09/2021  PCP: Wannetta Sender, FNP  Admit date: 05/09/2021 Discharge date: 05/16/2021  Admitted From: Home Disposition:  SNF   Recommendations for Outpatient Follow-up:  Follow up with PCP in 1 week  Discharge Condition:  The patient was seen and examined this morning, stable no acute distress, blood pressure has much improved with change in medication to labetalol Continues to have generalized weaknesses, pending discharge to SNF  Ready for discharge to SNF  The patient was seen and examined, remained stable   CODE STATUS: Full  Diet recommendation:  Diet Orders (From admission, onward)     Start     Ordered   05/14/21 0000  Diet - low sodium heart healthy        05/14/21 1058   05/10/21 1806  DIET DYS 3 Room service appropriate? Yes; Fluid consistency: Thin  Diet effective now       Question Answer Comment  Room service appropriate? Yes   Fluid consistency: Thin      05/10/21 1805           Brief/Interim Summary: [name] is an 80 y.o. adult with medical history significant for peripheral neuropathy, DDD, depression, COPD, CKD stage IIIB, hypertension, HIV infection presents to the emergency department due to 4-week onset of cough, sore throat, subjective fever, intermittent diaphoresis and generalized weakness to the extent that patient has difficulty in being able to ambulate, she states that she has been mostly bedbound/recliner bound since onset of symptoms about 4 weeks ago.  Patient states that she was given 2 different antibiotics, but she was unable to remember the names.  She endorsed occasional choking on food/drink.  Patient states that her PCP was trying to place at [facility] for rehab so as to get her strength back.  Patient lives with a roommate.  She was treated with IV antibiotics, sputum culture with normal flora. Patient was evaluated by SLP, PT, OT during  hospitalization.   Discharge Diagnoses:  Principal Problem:   CAP (community acquired pneumonia) Active Problems:   HIV disease ([place])   Esophageal dysphagia   Essential hypertension   CKD (chronic kidney disease) stage 3, GFR 30-59 ml/min (HCC)   COPD (chronic obstructive pulmonary disease) (HCC)   Generalized weakness   Elevated MCV  Community-acquired pneumonia -Failed outpatient treatment -CT chest with suggestion of bilateral upper lobe infiltrates, multifocal pneumonia -Continue ceftriaxone, azithromycin, flagyl --> Augmentin on discharge  -Sputum culture with normal flora  -Persistent rhonchi, mucolytic with scheduled, continue pulmonary toiletry   Generalized weakness, frailty -PT OT evaluation recommending SNF placement   Dysphagia -SLP evaluation recommending dysphagia 3 diet   Hypertension -Hypertensive overnight -Continue losartan, Coreg was discontinued, substituted for labetalol for better blood pressure control  CKD stage IIIa -Baseline creatinine 1.2-1.7 -Stable   HIV -Followed by Dr. [name] -Continue Triumeq   History of schizophrenia -Has had visual and auditory hallucinations earlier this year, went to [facility]ipsych March 2022    Diarrhea -C. difficile negative -Resolved      Discharge Instructions  Discharge Instructions     Activity as tolerated - No restrictions   Complete by: As directed    Diet - low sodium heart healthy   Complete by: As directed    Increase activity slowly   Complete by: As directed    Increase activity slowly   Complete by: As directed       Allergies as  of 05/16/2021       Reactions   Bee Venom Anaphylaxis   Promethazine Swelling   Tenofovir Disoproxil Fumarate [tenofovir Disoproxil Fumarate] Other (See Comments)   Renal failure, (08/27/2006)   Compazine  [prochlorperazine Edisylate] Nausea And Vomiting   Haloperidol Lactate Other (See Comments)   Reaction is muscle tension, causes severe  spasms in face and neck. Forces eyes to roll in the back of the head   Sulfa Antibiotics Diarrhea   Brompheniramine-acetaminophen    Chlorcyclizine    Chlorpromazine    Codeine Itching, Nausea And Vomiting   Navane [thiothixene] Other (See Comments), Nausea And Vomiting   Same muscle spasm reaction as haldol   Prednisone    Brief mild psychosis and agitation   Promethazine Hcl Other (See Comments)   Propoxyphene N-acetaminophen Itching, Nausea And Vomiting   Morphine Itching, Swelling        Medication List     STOP taking these medications    carvedilol 6.25 MG tablet Commonly known as: COREG   LORazepam 0.5 MG tablet Commonly known as: ATIVAN   nebivolol 5 MG tablet Commonly known as: Bystolic   prazosin 1 MG capsule Commonly known as: MINIPRESS       TAKE these medications    acetaminophen 500 MG tablet Commonly known as: TYLENOL Take 2 tablets (1,000 mg total) by mouth every 6 (six) hours as needed for mild pain or moderate pain.   albuterol 108 (90 Base) MCG/ACT inhaler Commonly known as: VENTOLIN HFA Inhale 1-2 puffs into the lungs every 6 (six) hours as needed for wheezing or shortness of breath.   Allergy Relief 10 MG tablet Generic drug: loratadine Take 10 mg by mouth daily.   amoxicillin-clavulanate 875-125 MG tablet Commonly known as: Augmentin Take 1 tablet by mouth 2 (two) times daily for 5 days.   clonazePAM 1 MG tablet Commonly known as: KLONOPIN Take 1 tablet (1 mg total) by mouth 2 (two) times daily as needed for anxiety.   diphenhydrAMINE 25 mg capsule Commonly known as: BENADRYL Take 25 mg by mouth daily as needed for itching (itching from codeine).   docusate sodium 100 MG capsule Commonly known as: COLACE Take 1 capsule (100 mg total) by mouth 2 (two) times daily.   DULoxetine 60 MG capsule Commonly known as: CYMBALTA Take 60 mg by mouth daily. What changed: Another medication with the same name was removed. Continue taking  this medication, and follow the directions you see here.   Fish Oil Burp-Less 1000 MG Caps Take 1 capsule by mouth daily.   fluticasone 50 MCG/ACT nasal spray Commonly known as: FLONASE 2 SPRAYS INTO BOTH NOSTRILS AT BEDTIME. What changed: See the new instructions.   furosemide 40 MG tablet Commonly known as: LASIX Take 1 tablet (40 mg total) by mouth daily as needed for fluid. Hold until follow up with PCP   guaiFENesin-dextromethorphan 100-10 MG/5ML syrup Commonly known as: ROBITUSSIN DM Take 10 mLs by mouth every 8 (eight) hours for 10 days.   haloperidol 10 MG tablet Commonly known as: HALDOL Take 10 mg by mouth at bedtime.   ipratropium-albuterol 0.5-2.5 (3) MG/3ML Soln Commonly known as: DUONEB Inhale 3 mLs into the lungs every 6 (six) hours as needed (shortness of breath).   labetalol 200 MG tablet Commonly known as: NORMODYNE Take 1 tablet (200 mg total) by mouth 2 (two) times daily.   losartan 25 MG tablet Commonly known as: COZAAR Take 12.5 mg by mouth daily. What changed: Another medication with  the same name was removed. Continue taking this medication, and follow the directions you see here.   magnesium 30 MG tablet Take 30 mg by mouth at bedtime.   nystatin powder Commonly known as: MYCOSTATIN/NYSTOP Apply 1 application topically 3 (three) times daily. Apply to groin region until redness and irritation improving   ondansetron 8 MG tablet Commonly known as: ZOFRAN Take 8 mg by mouth daily as needed for nausea or vomiting.   ONE-A-DAY 55 PLUS PO Take 1 tablet by mouth daily.   pantoprazole 40 MG tablet Commonly known as: Protonix Take 1 tablet (40 mg total) by mouth daily before breakfast.   polyvinyl alcohol 1.4 % ophthalmic solution Commonly known as: LIQUIFILM TEARS Place 1 drop into both eyes daily.   psyllium 95 % Pack Commonly known as: HYDROCIL/METAMUCIL Take 1 packet by mouth daily.   thiamine 100 MG tablet Commonly known as: Vitamin  B-1 Take 100 mg by mouth daily.   Triumeq 600-50-300 MG tablet Generic drug: abacavir-dolutegravir-lamiVUDine Take 1 tablet by mouth daily.   Vitamin D3 250 MCG (10000 UT) Tabs Take 1 tablet by mouth daily.   Xtampza ER 13.5 MG C12a Generic drug: oxyCODONE ER Take 1 capsule by mouth 2 (two) times daily.         Contact information for follow-up providers     Wannetta Sender, FNP. Schedule an appointment as soon as possible for a visit in 1 week(s).   Specialty: Family Medicine Contact information: [place] [facility] [address] [place] [address] [phone]              Contact information for after-discharge care     Destination     [facility] .   Service: Skilled Nursing Contact information: [place] [address] [phone]                    Allergies  Allergen Reactions   Bee Venom Anaphylaxis   Promethazine Swelling   Tenofovir Disoproxil Fumarate [Tenofovir Disoproxil Fumarate] Other (See Comments)    Renal failure, (08/27/2006)   Compazine  [Prochlorperazine Edisylate] Nausea And Vomiting   Haloperidol Lactate Other (See Comments)    Reaction is muscle tension, causes severe spasms in face and neck. Forces eyes to roll in the back of the head   Sulfa Antibiotics Diarrhea   Brompheniramine-Acetaminophen    Chlorcyclizine    Chlorpromazine    Codeine Itching and Nausea And Vomiting   Navane [Thiothixene] Other (See Comments) and Nausea And Vomiting    Same muscle spasm reaction as haldol   Prednisone     Brief mild psychosis and agitation   Promethazine Hcl Other (See Comments)   Propoxyphene N-Acetaminophen Itching and Nausea And Vomiting   Morphine Itching and Swelling    Consultations: None    Procedures/Studies: DG Chest 2 View  Result Date: 05/09/2021 CLINICAL DATA:  Cough EXAM: CHEST - 2 VIEW COMPARISON:  12/26/2020, 12/23/2020 FINDINGS:  A rounded density has developed within the right hilar region possibly representing vascular shadow, hilar adenopathy, or a perihilar pulmonary nodule. The lungs are otherwise clear. No pneumothorax or pleural effusion. Cardiac size within normal limits. Pulmonary vascularity is normal. No acute bone abnormality. IMPRESSION: No radiographic evidence of acute cardiopulmonary disease. Interval development of a rounded opacity within the right hilar region. See differential considerations above. This could be better assessed with dedicated contrast enhanced CT examination of the chest. Alternatively, follow-up chest  radiograph in 4-6 weeks may helpful in documenting resolution. Electronically Signed   By: Fidela Salisbury MD   On: 05/09/2021 17:50   CT Chest W Contrast  Result Date: 05/09/2021 CLINICAL DATA:  Weakness and cough for 3 weeks, wheezing, abnormal chest x-ray at right hilum EXAM: CT CHEST WITH CONTRAST TECHNIQUE: Multidetector CT imaging of the chest was performed during intravenous contrast administration. CONTRAST:  43mL OMNIPAQUE IOHEXOL 300 MG/ML  SOLN COMPARISON:  05/09/2021 FINDINGS: Cardiovascular: The heart is unremarkable without pericardial effusion. Prominent atherosclerosis of the distal aortic arch and descending thoracic aorta. No evidence of aneurysm or dissection. Mediastinum/Nodes: No enlarged mediastinal, hilar, or axillary lymph nodes. Thyroid gland, trachea, and esophagus demonstrate no significant findings. Lungs/Pleura: There are patchy bilateral areas of ground-glass airspace disease most pronounced within the suprahilar regions, which could reflect early edema or atypical viral infection such as COVID-19. No effusion or pneumothorax. The central airways are patent. Upper Abdomen: No acute abnormality. Musculoskeletal: No acute or destructive bony lesions. Reconstructed images demonstrate no additional findings. IMPRESSION: 1. Patchy upper lobe predominant ground-glass airspace  disease consistent with multifocal infection or early edema. 2. No abnormality at the right hilum to correspond to the chest x-ray finding. The rounded density at the right hilum on chest x-ray likely reflects a pulmonary vessel seen on end or clothing artifact. 3.  Aortic Atherosclerosis (ICD10-I70.0). Electronically Signed   By: Randa Ngo M.D.   On: 05/09/2021 21:08       Discharge Exam: Vitals:   05/16/21 1055 05/16/21 1103  BP: 137/63 137/63  Pulse: 65 70  Resp:    Temp:    SpO2: 100% 100%      Physical Exam:   General:  Alert, oriented, cooperative, no distress;   HEENT:  Normocephalic, PERRL, otherwise with in Normal limits   Neuro:  CNII-XII intact. , normal motor and sensation, reflexes intact   Lungs:   Clear to auscultation BL, Respirations unlabored, scattered improved rhonchi, wheezing , no crackles  Cardio:    S1/S2, RRR, No murmure, No Rubs or Gallops   Abdomen:   Soft, non-tender, bowel sounds active all four quadrants,  no guarding or peritoneal signs.  Muscular skeletal:  Limited exam - in bed, able to move all 4 extremities, Normal strength,  2+ pulses,  symmetric, No pitting edema  Skin:  Dry, warm to touch, negative for any Rashes,  Wounds: Please see nursing documentation         The results of significant diagnostics from this hospitalization (including imaging, microbiology, ancillary and laboratory) are listed below for reference.     Microbiology: Recent Results (from the past 240 hour(s))  Resp Panel by RT-PCR (Flu A&B, Covid) Nasopharyngeal Swab     Status: None   Collection Time: 05/09/21 10:00 PM   Specimen: Nasopharyngeal Swab; Nasopharyngeal(NP) swabs in vial transport medium  Result Value Ref Range Status   SARS Coronavirus 2 by RT PCR NEGATIVE NEGATIVE Final    Comment: (NOTE) SARS-CoV-2 target nucleic acids are NOT DETECTED.  The SARS-CoV-2 RNA is generally detectable in upper respiratory specimens during the acute phase of  infection. The lowest concentration of SARS-CoV-2 viral copies this assay can detect is 138 copies/mL. A negative result does not preclude SARS-Cov-2 infection and should not be used as the sole basis for treatment or other patient management decisions. A negative result may occur with  improper specimen collection/handling, submission of specimen other than nasopharyngeal swab, presence of viral mutation(s) within the areas targeted by  this assay, and inadequate number of viral copies(<138 copies/mL). A negative result must be combined with clinical observations, patient history, and epidemiological information. The expected result is Negative.  Fact Sheet for Patients:  EntrepreneurPulse.com.au  Fact Sheet for Healthcare Providers:  IncredibleEmployment.be  This test is no t yet approved or cleared by the [place] FDA and  has been authorized for detection and/or diagnosis of SARS-CoV-2 by FDA under an Emergency Use Authorization (EUA). This EUA will remain  in effect (meaning this test can be used) for the duration of the COVID-19 declaration under Section 564(b)(1) of the Act, 21 U.S.C.section 360bbb-3(b)(1), unless the authorization is terminated  or revoked sooner.       Influenza A by PCR NEGATIVE NEGATIVE Final   Influenza B by PCR NEGATIVE NEGATIVE Final    Comment: (NOTE) The Xpert Xpress SARS-CoV-2/FLU/RSV plus assay is intended as an aid in the diagnosis of influenza from Nasopharyngeal swab specimens and should not be used as a sole basis for treatment. Nasal washings and aspirates are unacceptable for Xpert Xpress SARS-CoV-2/FLU/RSV testing.  Fact Sheet for Patients: EntrepreneurPulse.com.au  Fact Sheet for Healthcare Providers: IncredibleEmployment.be  This test is not yet approved or cleared by the [place] FDA and has been authorized for detection and/or diagnosis of SARS-CoV-2  by FDA under an Emergency Use Authorization (EUA). This EUA will remain in effect (meaning this test can be used) for the duration of the COVID-19 declaration under Section 564(b)(1) of the Act, 21 U.S.C. section 360bbb-3(b)(1), unless the authorization is terminated or revoked.  Performed at [facility], [address], [place], [place] [address]   Expectorated Sputum Assessment w Gram Stain, Rflx to Resp Cult     Status: None   Collection Time: 05/10/21  2:26 AM   Specimen: Expectorated Sputum  Result Value Ref Range Status   Specimen Description   Final    EXPECTORATED SPUTUM Performed at [facility], [address], [place], [place] [address]    Special Requests   Final    NONE Performed at [facility], [address], [place], [place] [address]    Sputum evaluation   Final    THIS SPECIMEN IS ACCEPTABLE FOR SPUTUM CULTURE Performed at [facility], [place] [address], [place], [place] [address]    Report Status 05/10/2021 FINAL  Final  Culture, Respiratory w Gram Stain     Status: None   Collection Time: 05/10/21  2:26 AM  Result Value Ref Range Status   Specimen Description EXPECTORATED SPUTUM  Final   Special Requests NONE Reflexed from [id number]  Final   Gram Stain   Final    FEW WBC PRESENT,BOTH PMN AND MONONUCLEAR FEW GRAM NEGATIVE RODS FEW GRAM VARIABLE ROD RARE GRAM POSITIVE COCCI    Culture   Final    FEW Normal respiratory flora-no Staph aureus or Pseudomonas seen Performed at [facility], [place] [address], [place], [place] [address]    Report Status 05/13/2021 FINAL  Final  Culture, blood (Routine X 2) w Reflex to ID Panel     Status: None   Collection Time: 05/10/21  3:40 AM   Specimen: Right Antecubital; Blood  Result Value Ref Range Status   Specimen Description RIGHT ANTECUBITAL  Final   Special Requests   Final    BOTTLES DRAWN AEROBIC AND ANAEROBIC Blood Culture adequate volume   Culture   Final    NO GROWTH 5 DAYS Performed at [facility], [address]  [place], [place] [address]    Report Status 05/15/2021 FINAL  Final  Culture, blood (Routine X 2) w Reflex to ID Panel     Status: None   Collection Time: 05/10/21  3:48 AM   Specimen: BLOOD RIGHT HAND  Result Value Ref Range Status   Specimen Description BLOOD RIGHT HAND  Final   Special Requests   Final    BOTTLES DRAWN AEROBIC ONLY Blood Culture adequate volume   Culture   Final    NO GROWTH 5 DAYS Performed at [facility], [address], [place], [place] [address]    Report Status 05/15/2021 FINAL  Final  C Difficile Quick Screen w PCR reflex     Status: None   Collection Time: 05/10/21 11:56 AM   Specimen: STOOL  Result Value Ref Range Status   C Diff antigen NEGATIVE NEGATIVE Final   C Diff toxin NEGATIVE NEGATIVE Final   C Diff interpretation No C. difficile detected.  Final    Comment: Performed at [facility], [address], [place], [place] [address]  Resp Panel by RT-PCR (Flu A&B, Covid) Nasopharyngeal Swab     Status: None   Collection Time: 05/13/21 10:10 AM   Specimen: Nasopharyngeal Swab; Nasopharyngeal(NP) swabs in vial transport medium  Result Value Ref Range Status   SARS Coronavirus 2 by RT PCR NEGATIVE NEGATIVE Final    Comment: (NOTE) SARS-CoV-2 target nucleic acids are NOT DETECTED.  The SARS-CoV-2 RNA is generally detectable in upper respiratory specimens during the acute phase of infection. The lowest concentration of SARS-CoV-2 viral copies this assay can detect is 138 copies/mL. A negative result does not preclude SARS-Cov-2 infection and should not be used as the sole basis for treatment or other patient management decisions. A negative result may occur with  improper specimen collection/handling, submission of specimen other than nasopharyngeal swab, presence of viral mutation(s) within the areas targeted by this assay, and inadequate number of viral copies(<138 copies/mL). A negative result must be combined with clinical  observations, patient history, and epidemiological information. The expected result is Negative.  Fact Sheet for Patients:  EntrepreneurPulse.com.au  Fact Sheet for Healthcare Providers:  IncredibleEmployment.be  This test is no t yet approved or cleared by the [place] FDA and  has been authorized for detection and/or diagnosis of SARS-CoV-2 by FDA under an Emergency Use Authorization (EUA). This EUA will remain  in effect (meaning this test can be used) for the duration of the COVID-19 declaration under Section 564(b)(1) of the Act, 21 U.S.C.section 360bbb-3(b)(1), unless the authorization is terminated  or revoked sooner.       Influenza A by PCR NEGATIVE NEGATIVE Final   Influenza B by PCR NEGATIVE NEGATIVE Final    Comment: (NOTE) The Xpert Xpress SARS-CoV-2/FLU/RSV plus assay is intended as an aid in the diagnosis of influenza from Nasopharyngeal swab specimens and should not be used as a sole basis for treatment. Nasal washings and aspirates are unacceptable for Xpert Xpress SARS-CoV-2/FLU/RSV testing.  Fact Sheet for Patients: EntrepreneurPulse.com.au  Fact Sheet for Healthcare Providers: IncredibleEmployment.be  This test is not yet approved or cleared by the [place] FDA and has been authorized for detection and/or diagnosis of SARS-CoV-2 by FDA under an Emergency Use Authorization (EUA). This EUA will remain in effect (meaning this test can be used) for the duration of the COVID-19 declaration under Section 564(b)(1) of the Act, 21 U.S.C. section 360bbb-3(b)(1), unless the authorization is terminated or revoked.  Performed at [facility], [place]  [address] [place],  [address]      Labs: BNP (last 3 results) No results for input(s): BNP in the last 8760 hours. Basic Metabolic Panel: Recent Labs  Lab 05/09/21 1633 05/10/21 0340 05/11/21 0600  NA 137 138 140  K 4.5 4.4  4.4  CL 104 106 106  CO2 27 25 26   GLUCOSE 101* 103* 96  BUN 27* 23 19  CREATININE 1.28* 1.17* 1.15*  CALCIUM 10.4* 10.1 10.5*  MG  --  2.0  --   PHOS  --  2.9  --    Liver Function Tests: Recent Labs  Lab 05/09/21 1633 05/10/21 0340  AST 19 20  ALT 19 18  ALKPHOS 97 88  BILITOT 0.6 0.8  PROT 7.5 6.9  ALBUMIN 4.5 4.0   No results for input(s): LIPASE, AMYLASE in the last 168 hours. No results for input(s): AMMONIA in the last 168 hours. CBC: Recent Labs  Lab 05/09/21 1633 05/10/21 0340 05/11/21 0600  WBC 7.2 9.4 6.6  NEUTROABS 3.7  --   --   HGB 14.9 14.2 15.5*  HCT 42.8 41.2 45.4  MCV 105.2* 104.0* 104.1*  PLT 237 199 150  Urinalysis    Component Value Date/Time   COLORURINE STRAW (A) 12/23/2020 1621   APPEARANCEUR CLEAR 12/23/2020 1621   APPEARANCEUR Turbid (A) 11/21/2020 1430   LABSPEC 1.010 12/23/2020 1621   PHURINE 6.0 12/23/2020 1621   GLUCOSEU NEGATIVE 12/23/2020 1621   GLUCOSEU NEG mg/dL 09/20/2007 1114   HGBUR NEGATIVE 12/23/2020 1621   HGBUR moderate 06/04/2010 1425   BILIRUBINUR NEGATIVE 12/23/2020 1621   BILIRUBINUR Negative 11/21/2020 1430   KETONESUR NEGATIVE 12/23/2020 1621   PROTEINUR 30 (A) 12/23/2020 1621   UROBILINOGEN 1.0 05/31/2014 0933   NITRITE NEGATIVE 12/23/2020 1621   LEUKOCYTESUR NEGATIVE 12/23/2020 1621   Sepsis Labs Invalid input(s): PROCALCITONIN,  WBC,  LACTICIDVEN Microbiology Recent Results (from the past 240 hour(s))  Resp Panel by RT-PCR (Flu A&B, Covid) Nasopharyngeal Swab     Status: None   Collection Time: 05/09/21 10:00 PM   Specimen: Nasopharyngeal Swab; Nasopharyngeal(NP) swabs in vial transport medium  Result Value Ref Range Status   SARS Coronavirus 2 by RT PCR NEGATIVE NEGATIVE Final    Comment: (NOTE) SARS-CoV-2 target nucleic acids are NOT DETECTED.  The SARS-CoV-2 RNA is generally detectable in upper respiratory specimens during the acute phase of infection. The lowest concentration of SARS-CoV-2  viral copies this assay can detect is 138 copies/mL. A negative result does not preclude SARS-Cov-2 infection and should not be used as the sole basis for treatment or other patient management decisions. A negative result may occur with  improper specimen collection/handling, submission of specimen other than nasopharyngeal swab, presence of viral mutation(s) within the areas targeted by this assay, and inadequate number of viral copies(<138 copies/mL). A negative result must be combined with clinical observations, patient history, and epidemiological information. The expected result is Negative.  Fact Sheet for Patients:  EntrepreneurPulse.com.au  Fact Sheet for Healthcare Providers:  IncredibleEmployment.be  This test is no t yet approved or cleared by the [place] FDA and  has been authorized for detection and/or diagnosis of SARS-CoV-2 by FDA under an Emergency Use Authorization (EUA). This EUA will remain  in effect (meaning this test can be used) for the duration of the COVID-19 declaration under Section 564(b)(1) of the Act, 21 U.S.C.section 360bbb-3(b)(1), unless the authorization is terminated  or revoked sooner.       Influenza A by PCR NEGATIVE NEGATIVE  Final   Influenza B by PCR NEGATIVE NEGATIVE Final    Comment: (NOTE) The Xpert Xpress SARS-CoV-2/FLU/RSV plus assay is intended as an aid in the diagnosis of influenza from Nasopharyngeal swab specimens and should not be used as a sole basis for treatment. Nasal washings and aspirates are unacceptable for Xpert Xpress SARS-CoV-2/FLU/RSV testing.  Fact Sheet for Patients: EntrepreneurPulse.com.au  Fact Sheet for Healthcare Providers: IncredibleEmployment.be  This test is not yet approved or cleared by the [place] FDA and has been authorized for detection and/or diagnosis of SARS-CoV-2 by FDA under an Emergency Use Authorization  (EUA). This EUA will remain in effect (meaning this test can be used) for the duration of the COVID-19 declaration under Section 564(b)(1) of the Act, 21 U.S.C. section 360bbb-3(b)(1), unless the authorization is terminated or revoked.  Performed at [facility], [address], [place], [place] [address]   Expectorated Sputum Assessment w Gram Stain, Rflx to Resp Cult     Status: None   Collection Time: 05/10/21  2:26 AM   Specimen: Expectorated Sputum  Result Value Ref Range Status   Specimen Description   Final    EXPECTORATED SPUTUM Performed at [facility], [address] [place], [place] [address]    Special Requests   Final    NONE Performed at [facility], [address], [place], [place] [address]    Sputum evaluation   Final    THIS SPECIMEN IS ACCEPTABLE FOR SPUTUM CULTURE Performed at [facility], [place] [address] [place], [place] [address]    Report Status 05/10/2021 FINAL  Final  Culture, Respiratory w Gram Stain     Status: None   Collection Time: 05/10/21  2:26 AM  Result Value Ref Range Status   Specimen Description EXPECTORATED SPUTUM  Final   Special Requests NONE Reflexed from [phone]  Final   Gram Stain   Final    FEW WBC PRESENT,BOTH PMN AND MONONUCLEAR FEW GRAM NEGATIVE RODS FEW GRAM VARIABLE ROD RARE GRAM POSITIVE COCCI    Culture   Final    FEW Normal respiratory flora-no Staph aureus or Pseudomonas seen Performed at [facility], [place] [address], [place], [place] [address]    Report Status 05/13/2021 FINAL  Final  Culture, blood (Routine X 2) w Reflex to ID Panel     Status: None   Collection Time: 05/10/21  3:40 AM   Specimen: Right Antecubital; Blood  Result Value Ref Range Status   Specimen Description RIGHT ANTECUBITAL  Final   Special Requests   Final    BOTTLES DRAWN AEROBIC AND ANAEROBIC Blood Culture adequate volume   Culture   Final    NO GROWTH 5 DAYS Performed at [facility], [address] [place], [place] [address]     Report Status 05/15/2021 FINAL  Final  Culture, blood (Routine X 2) w Reflex to ID Panel     Status: None   Collection Time: 05/10/21  3:48 AM   Specimen: BLOOD RIGHT HAND  Result Value Ref Range Status   Specimen Description BLOOD RIGHT HAND  Final   Special Requests   Final    BOTTLES DRAWN AEROBIC ONLY Blood Culture adequate volume   Culture   Final    NO GROWTH 5 DAYS Performed at [facility], [address], [place], [place] [address]    Report Status 05/15/2021 FINAL  Final  C Difficile Quick Screen w PCR reflex     Status: None   Collection Time:  05/10/21 11:56 AM   Specimen: STOOL  Result Value Ref Range Status   C Diff antigen NEGATIVE NEGATIVE Final   C Diff toxin NEGATIVE NEGATIVE Final   C Diff interpretation No C. difficile detected.  Final    Comment: Performed at [facility], [address], [place], [place] [address]  Resp Panel by RT-PCR (Flu A&B, Covid) Nasopharyngeal Swab     Status: None   Collection Time: 05/13/21 10:10 AM   Specimen: Nasopharyngeal Swab; Nasopharyngeal(NP) swabs in vial transport medium  Result Value Ref Range Status   SARS Coronavirus 2 by RT PCR NEGATIVE NEGATIVE Final    Comment: (NOTE) SARS-CoV-2 target nucleic acids are NOT DETECTED.  The SARS-CoV-2 RNA is generally detectable in upper respiratory specimens during the acute phase of infection. The lowest concentration of SARS-CoV-2 viral copies this assay can detect is 138 copies/mL. A negative result does not preclude SARS-Cov-2 infection and should not be used as the sole basis for treatment or other patient management decisions. A negative result may occur with  improper specimen collection/handling, submission of specimen other than nasopharyngeal swab, presence of viral mutation(s) within the areas targeted by this assay, and inadequate number of viral copies(<138 copies/mL). A negative result must be combined with clinical observations, patient history, and  epidemiological information. The expected result is Negative.  Fact Sheet for Patients:  EntrepreneurPulse.com.au  Fact Sheet for Healthcare Providers:  IncredibleEmployment.be  This test is no t yet approved or cleared by the [place] FDA and  has been authorized for detection and/or diagnosis of SARS-CoV-2 by FDA under an Emergency Use Authorization (EUA). This EUA will remain  in effect (meaning this test can be used) for the duration of the COVID-19 declaration under Section 564(b)(1) of the Act, 21 U.S.C.section 360bbb-3(b)(1), unless the authorization is terminated  or revoked sooner.       Influenza A by PCR NEGATIVE NEGATIVE Final   Influenza B by PCR NEGATIVE NEGATIVE Final    Comment: (NOTE) The Xpert Xpress SARS-CoV-2/FLU/RSV plus assay is intended as an aid in the diagnosis of influenza from Nasopharyngeal swab specimens and should not be used as a sole basis for treatment. Nasal washings and aspirates are unacceptable for Xpert Xpress SARS-CoV-2/FLU/RSV testing.  Fact Sheet for Patients: EntrepreneurPulse.com.au  Fact Sheet for Healthcare Providers: IncredibleEmployment.be  This test is not yet approved or cleared by the [place] FDA and has been authorized for detection and/or diagnosis of SARS-CoV-2 by FDA under an Emergency Use Authorization (EUA). This EUA will remain in effect (meaning this test can be used) for the duration of the COVID-19 declaration under Section 564(b)(1) of the Act, 21 U.S.C. section 360bbb-3(b)(1), unless the authorization is terminated or revoked.  Performed at [facility], [address] [place], [place] [address]      Patient was seen and examined on the day of discharge and was found to be in stable condition. Time coordinating discharge: 35 minutes including assessment and coordination of care, as well as examination of the patient.    SIGNED:  Deatra James, DO [facility] 05/16/2021, 12:01 PM

## 2021-05-17 DIAGNOSIS — N1832 Chronic kidney disease, stage 3b: Secondary | ICD-10-CM | POA: Diagnosis not present

## 2021-05-17 DIAGNOSIS — J189 Pneumonia, unspecified organism: Secondary | ICD-10-CM | POA: Diagnosis not present

## 2021-05-17 DIAGNOSIS — I10 Essential (primary) hypertension: Secondary | ICD-10-CM | POA: Diagnosis not present

## 2021-05-17 MED ORDER — LABETALOL HCL 100 MG PO TABS: 100.0000 mg | ORAL_TABLET | Freq: Two times a day (BID) | ORAL | 0 refills | Status: DC

## 2021-05-17 MED ORDER — LABETALOL HCL 200 MG PO TABS
100.0000 mg | ORAL_TABLET | Freq: Two times a day (BID) | ORAL | Status: DC
  Administered 2021-05-17: 100 mg via ORAL
  Filled 2021-05-17: qty 1

## 2021-05-17 NOTE — Discharge Summary (Signed)
Physician Discharge Summary  [name] Leisure VZD:[id number] DOB: 1941/08/05 DOA: 05/09/2021  PCP: Wannetta Sender, FNP  Admit date: 05/09/2021 Discharge date: 05/17/2021  Admitted From: Home Disposition:  SNF   Recommendations for Outpatient Follow-up:  Follow up with PCP in 1 week  Discharge Condition:  Patient was seen and examined, hemodynamically stable, no changes to plan of care today  Blood pressure has much improved with change in medication to labetalol Continues to have generalized weaknesses, pending discharge to SNF  Patient ready and stable to be discharged to SNF     CODE STATUS: Full  Diet recommendation:  Diet Orders (From admission, onward)     Start     Ordered   05/14/21 0000  Diet - low sodium heart healthy        05/14/21 1058   05/10/21 1806  DIET DYS 3 Room service appropriate? Yes; Fluid consistency: Thin  Diet effective now       Question Answer Comment  Room service appropriate? Yes   Fluid consistency: Thin      05/10/21 1805           Brief/Interim Summary: [name] is an 80 y.o. adult with medical history significant for peripheral neuropathy, DDD, depression, COPD, CKD stage IIIB, hypertension, HIV infection presents to the emergency department due to 4-week onset of cough, sore throat, subjective fever, intermittent diaphoresis and generalized weakness to the extent that patient has difficulty in being able to ambulate, she states that she has been mostly bedbound/recliner bound since onset of symptoms about 4 weeks ago.  Patient states that she was given 2 different antibiotics, but she was unable to remember the names.  She endorsed occasional choking on food/drink.  Patient states that her PCP was trying to place at [facility] for rehab so as to get her strength back.  Patient lives with a roommate.  She was treated with IV antibiotics, sputum culture with normal flora. Patient was evaluated by SLP, PT, OT during  hospitalization.   Discharge Diagnoses:  Principal Problem:   CAP (community acquired pneumonia) Active Problems:   HIV disease ([facility])   Esophageal dysphagia   Essential hypertension   CKD (chronic kidney disease) stage 3, GFR 30-59 ml/min (HCC)   COPD (chronic obstructive pulmonary disease) (HCC)   Generalized weakness   Elevated MCV  Community-acquired pneumonia -Failed outpatient treatment -CT chest with suggestion of bilateral upper lobe infiltrates, multifocal pneumonia -Continue ceftriaxone, azithromycin, flagyl --> Augmentin on discharge  -Sputum culture with normal flora  -Persistent rhonchi, mucolytic with scheduled, continue pulmonary toiletry   Generalized weakness, frailty -PT OT evaluation recommending SNF placement   Dysphagia -SLP evaluation recommending dysphagia 3 diet   Hypertension -Hypertensive overnight -Continue losartan, Coreg was discontinued, substituted for labetalol for better blood pressure control  CKD stage IIIa -Baseline creatinine 1.2-1.7 -Stable   HIV -Followed by Dr. [name] -Continue Triumeq   History of schizophrenia -Has had visual and auditory hallucinations earlier this year, went to [facility] geripsych March 2022    Diarrhea -C. difficile negative -Resolved      Discharge Instructions  Discharge Instructions     Activity as tolerated - No restrictions   Complete by: As directed    Activity as tolerated - No restrictions   Complete by: As directed    Diet - low sodium heart healthy   Complete by: As directed    Increase activity slowly   Complete by: As directed    Increase activity  slowly   Complete by: As directed    Increase activity slowly   Complete by: As directed       Allergies as of 05/17/2021       Reactions   Bee Venom Anaphylaxis   Promethazine Swelling   Tenofovir Disoproxil Fumarate [tenofovir Disoproxil Fumarate] Other (See Comments)   Renal failure, (08/27/2006)   Compazine   [prochlorperazine Edisylate] Nausea And Vomiting   Haloperidol Lactate Other (See Comments)   Reaction is muscle tension, causes severe spasms in face and neck. Forces eyes to roll in the back of the head   Sulfa Antibiotics Diarrhea   Brompheniramine-acetaminophen    Chlorcyclizine    Chlorpromazine    Codeine Itching, Nausea And Vomiting   Navane [thiothixene] Other (See Comments), Nausea And Vomiting   Same muscle spasm reaction as haldol   Prednisone    Brief mild psychosis and agitation   Promethazine Hcl Other (See Comments)   Propoxyphene N-acetaminophen Itching, Nausea And Vomiting   Morphine Itching, Swelling        Medication List     STOP taking these medications    carvedilol 6.25 MG tablet Commonly known as: COREG   LORazepam 0.5 MG tablet Commonly known as: ATIVAN   nebivolol 5 MG tablet Commonly known as: Bystolic   prazosin 1 MG capsule Commonly known as: MINIPRESS       TAKE these medications    acetaminophen 500 MG tablet Commonly known as: TYLENOL Take 2 tablets (1,000 mg total) by mouth every 6 (six) hours as needed for mild pain or moderate pain.   albuterol 108 (90 Base) MCG/ACT inhaler Commonly known as: VENTOLIN HFA Inhale 1-2 puffs into the lungs every 6 (six) hours as needed for wheezing or shortness of breath.   Allergy Relief 10 MG tablet Generic drug: loratadine Take 10 mg by mouth daily.   amoxicillin-clavulanate 875-125 MG tablet Commonly known as: Augmentin Take 1 tablet by mouth 2 (two) times daily for 5 days.   clonazePAM 1 MG tablet Commonly known as: KLONOPIN Take 1 tablet (1 mg total) by mouth 2 (two) times daily as needed for anxiety.   diphenhydrAMINE 25 mg capsule Commonly known as: BENADRYL Take 25 mg by mouth daily as needed for itching (itching from codeine).   docusate sodium 100 MG capsule Commonly known as: COLACE Take 1 capsule (100 mg total) by mouth 2 (two) times daily.   DULoxetine 60 MG  capsule Commonly known as: CYMBALTA Take 60 mg by mouth daily. What changed: Another medication with the same name was removed. Continue taking this medication, and follow the directions you see here.   Fish Oil Burp-Less 1000 MG Caps Take 1 capsule by mouth daily.   fluticasone 50 MCG/ACT nasal spray Commonly known as: FLONASE 2 SPRAYS INTO BOTH NOSTRILS AT BEDTIME. What changed: See the new instructions.   furosemide 40 MG tablet Commonly known as: LASIX Take 1 tablet (40 mg total) by mouth daily as needed for fluid. Hold until follow up with PCP   guaiFENesin-dextromethorphan 100-10 MG/5ML syrup Commonly known as: ROBITUSSIN DM Take 10 mLs by mouth every 8 (eight) hours for 10 days.   haloperidol 10 MG tablet Commonly known as: HALDOL Take 10 mg by mouth at bedtime.   ipratropium-albuterol 0.5-2.5 (3) MG/3ML Soln Commonly known as: DUONEB Inhale 3 mLs into the lungs every 6 (six) hours as needed (shortness of breath).   labetalol 100 MG tablet Commonly known as: NORMODYNE Take 1 tablet (100 mg total)  by mouth 2 (two) times daily.   losartan 25 MG tablet Commonly known as: COZAAR Take 12.5 mg by mouth daily. What changed: Another medication with the same name was removed. Continue taking this medication, and follow the directions you see here.   magnesium 30 MG tablet Take 30 mg by mouth at bedtime.   nystatin powder Commonly known as: MYCOSTATIN/NYSTOP Apply 1 application topically 3 (three) times daily. Apply to groin region until redness and irritation improving   ondansetron 8 MG tablet Commonly known as: ZOFRAN Take 8 mg by mouth daily as needed for nausea or vomiting.   ONE-A-DAY 55 PLUS PO Take 1 tablet by mouth daily.   pantoprazole 40 MG tablet Commonly known as: Protonix Take 1 tablet (40 mg total) by mouth daily before breakfast.   polyvinyl alcohol 1.4 % ophthalmic solution Commonly known as: LIQUIFILM TEARS Place 1 drop into both eyes daily.    psyllium 95 % Pack Commonly known as: HYDROCIL/METAMUCIL Take 1 packet by mouth daily.   thiamine 100 MG tablet Commonly known as: Vitamin B-1 Take 100 mg by mouth daily.   Triumeq 600-50-300 MG tablet Generic drug: abacavir-dolutegravir-lamiVUDine Take 1 tablet by mouth daily.   Vitamin D3 250 MCG (10000 UT) Tabs Take 1 tablet by mouth daily.   Xtampza ER 13.5 MG C12a Generic drug: oxyCODONE ER Take 1 capsule by mouth 2 (two) times daily.         Contact information for follow-up providers     Wannetta Sender, FNP. Schedule an appointment as soon as possible for a visit in 1 week(s).   Specialty: Family Medicine Contact information: [place] [facility] [address] [place] [address] [phone]              Contact information for after-discharge care     Destination     [facility] .   Service: Skilled Nursing Contact information: [place] [address] [phone]                    Allergies  Allergen Reactions   Bee Venom Anaphylaxis   Promethazine Swelling   Tenofovir Disoproxil Fumarate [Tenofovir Disoproxil Fumarate] Other (See Comments)    Renal failure, (08/27/2006)   Compazine  [Prochlorperazine Edisylate] Nausea And Vomiting   Haloperidol Lactate Other (See Comments)    Reaction is muscle tension, causes severe spasms in face and neck. Forces eyes to roll in the back of the head   Sulfa Antibiotics Diarrhea   Brompheniramine-Acetaminophen    Chlorcyclizine    Chlorpromazine    Codeine Itching and Nausea And Vomiting   Navane [Thiothixene] Other (See Comments) and Nausea And Vomiting    Same muscle spasm reaction as haldol   Prednisone     Brief mild psychosis and agitation   Promethazine Hcl Other (See Comments)   Propoxyphene N-Acetaminophen Itching and Nausea And Vomiting   Morphine Itching and Swelling    Consultations: None     Procedures/Studies: DG Chest 2 View  Result Date: 05/09/2021 CLINICAL DATA:  Cough EXAM: CHEST - 2 VIEW COMPARISON:  12/26/2020, 12/23/2020 FINDINGS: A rounded density has developed within the right hilar region possibly representing vascular shadow, hilar adenopathy, or a perihilar pulmonary nodule. The lungs are otherwise clear. No pneumothorax or pleural effusion. Cardiac size within normal limits. Pulmonary vascularity is normal. No acute bone abnormality. IMPRESSION: No radiographic evidence of acute cardiopulmonary disease. Interval development of a rounded  opacity within the right hilar region. See differential considerations above. This could be better assessed with dedicated contrast enhanced CT examination of the chest. Alternatively, follow-up chest radiograph in 4-6 weeks may helpful in documenting resolution. Electronically Signed   By: Fidela Salisbury MD   On: 05/09/2021 17:50   CT Chest W Contrast  Result Date: 05/09/2021 CLINICAL DATA:  Weakness and cough for 3 weeks, wheezing, abnormal chest x-ray at right hilum EXAM: CT CHEST WITH CONTRAST TECHNIQUE: Multidetector CT imaging of the chest was performed during intravenous contrast administration. CONTRAST:  46mL OMNIPAQUE IOHEXOL 300 MG/ML  SOLN COMPARISON:  05/09/2021 FINDINGS: Cardiovascular: The heart is unremarkable without pericardial effusion. Prominent atherosclerosis of the distal aortic arch and descending thoracic aorta. No evidence of aneurysm or dissection. Mediastinum/Nodes: No enlarged mediastinal, hilar, or axillary lymph nodes. Thyroid gland, trachea, and esophagus demonstrate no significant findings. Lungs/Pleura: There are patchy bilateral areas of ground-glass airspace disease most pronounced within the suprahilar regions, which could reflect early edema or atypical viral infection such as COVID-19. No effusion or pneumothorax. The central airways are patent. Upper Abdomen: No acute abnormality. Musculoskeletal: No  acute or destructive bony lesions. Reconstructed images demonstrate no additional findings. IMPRESSION: 1. Patchy upper lobe predominant ground-glass airspace disease consistent with multifocal infection or early edema. 2. No abnormality at the right hilum to correspond to the chest x-ray finding. The rounded density at the right hilum on chest x-ray likely reflects a pulmonary vessel seen on end or clothing artifact. 3.  Aortic Atherosclerosis (ICD10-I70.0). Electronically Signed   By: Randa Ngo M.D.   On: 05/09/2021 21:08       Discharge Exam: Vitals:   05/17/21 0549 05/17/21 1136  BP: 105/72 (!) 197/69  Pulse: 65 68  Resp: 18   Temp: (!) 97.5 F (36.4 C)   SpO2: 99%         Physical Exam:   General:  Alert, oriented, cooperative, no distress;   HEENT:  Normocephalic, PERRL, otherwise with in Normal limits   Neuro:  CNII-XII intact. , normal motor and sensation, reflexes intact   Lungs:   Clear to auscultation BL, Respirations unlabored, no wheezes / crackles  Cardio:    S1/S2, RRR, No murmure, No Rubs or Gallops   Abdomen:   Soft, non-tender, bowel sounds active all four quadrants,  no guarding or peritoneal signs.  Muscular skeletal:  Limited exam - in bed, able to move all 4 extremities, Normal strength,  2+ pulses,  symmetric, No pitting edema  Skin:  Dry, warm to touch, negative for any Rashes,  Wounds: Please see nursing documentation                The results of significant diagnostics from this hospitalization (including imaging, microbiology, ancillary and laboratory) are listed below for reference.     Microbiology: Recent Results (from the past 240 hour(s))  Resp Panel by RT-PCR (Flu A&B, Covid) Nasopharyngeal Swab     Status: None   Collection Time: 05/09/21 10:00 PM   Specimen: Nasopharyngeal Swab; Nasopharyngeal(NP) swabs in vial transport medium  Result Value Ref Range Status   SARS Coronavirus 2 by RT PCR NEGATIVE NEGATIVE Final    Comment:  (NOTE) SARS-CoV-2 target nucleic acids are NOT DETECTED.  The SARS-CoV-2 RNA is generally detectable in upper respiratory specimens during the acute phase of infection. The lowest concentration of SARS-CoV-2 viral copies this assay can detect is 138 copies/mL. A negative result does not preclude SARS-Cov-2 infection and should not be  used as the sole basis for treatment or other patient management decisions. A negative result may occur with  improper specimen collection/handling, submission of specimen other than nasopharyngeal swab, presence of viral mutation(s) within the areas targeted by this assay, and inadequate number of viral copies(<138 copies/mL). A negative result must be combined with clinical observations, patient history, and epidemiological information. The expected result is Negative.  Fact Sheet for Patients:  EntrepreneurPulse.com.au  Fact Sheet for Healthcare Providers:  IncredibleEmployment.be  This test is no t yet approved or cleared by the [place] FDA and  has been authorized for detection and/or diagnosis of SARS-CoV-2 by FDA under an Emergency Use Authorization (EUA). This EUA will remain  in effect (meaning this test can be used) for the duration of the COVID-19 declaration under Section 564(b)(1) of the Act, 21 U.S.C.section 360bbb-3(b)(1), unless the authorization is terminated  or revoked sooner.       Influenza A by PCR NEGATIVE NEGATIVE Final   Influenza B by PCR NEGATIVE NEGATIVE Final    Comment: (NOTE) The Xpert Xpress SARS-CoV-2/FLU/RSV plus assay is intended as an aid in the diagnosis of influenza from Nasopharyngeal swab specimens and should not be used as a sole basis for treatment. Nasal washings and aspirates are unacceptable for Xpert Xpress SARS-CoV-2/FLU/RSV testing.  Fact Sheet for Patients: EntrepreneurPulse.com.au  Fact Sheet for Healthcare  Providers: IncredibleEmployment.be  This test is not yet approved or cleared by the [place] FDA and has been authorized for detection and/or diagnosis of SARS-CoV-2 by FDA under an Emergency Use Authorization (EUA). This EUA will remain in effect (meaning this test can be used) for the duration of the COVID-19 declaration under Section 564(b)(1) of the Act, 21 U.S.C. section 360bbb-3(b)(1), unless the authorization is terminated or revoked.  Performed at [facility], [address] [place], [place] [address]   Expectorated Sputum Assessment w Gram Stain, Rflx to Resp Cult     Status: None   Collection Time: 05/10/21  2:26 AM   Specimen: Expectorated Sputum  Result Value Ref Range Status   Specimen Description   Final    EXPECTORATED SPUTUM Performed at [facility], [address], [place], [place] [address]    Special Requests   Final    NONE Performed at [facility], [address], [place], [place] [address]    Sputum evaluation   Final    THIS SPECIMEN IS ACCEPTABLE FOR SPUTUM CULTURE Performed at [facility], [place] [address] [place], [place] [address]    Report Status 05/10/2021 FINAL  Final  Culture, Respiratory w Gram Stain     Status: None   Collection Time: 05/10/21  2:26 AM  Result Value Ref Range Status   Specimen Description EXPECTORATED SPUTUM  Final   Special Requests NONE Reflexed from [phone]  Final   Gram Stain   Final    FEW WBC PRESENT,BOTH PMN AND MONONUCLEAR FEW GRAM NEGATIVE RODS FEW GRAM VARIABLE ROD RARE GRAM POSITIVE COCCI    Culture   Final    FEW Normal respiratory flora-no Staph aureus or Pseudomonas seen Performed at [facility], [place] [address] [place], [place] [address]    Report Status 05/13/2021 FINAL  Final  Culture, blood (Routine X 2) w Reflex to ID Panel     Status: None   Collection Time: 05/10/21  3:40 AM   Specimen: Right Antecubital; Blood  Result Value Ref Range Status   Specimen  Description RIGHT ANTECUBITAL  Final  Special Requests   Final    BOTTLES DRAWN AEROBIC AND ANAEROBIC Blood Culture adequate volume   Culture   Final    NO GROWTH 5 DAYS Performed at [facility], [address], [place], [place] [address]    Report Status 05/15/2021 FINAL  Final  Culture, blood (Routine X 2) w Reflex to ID Panel     Status: None   Collection Time: 05/10/21  3:48 AM   Specimen: BLOOD RIGHT HAND  Result Value Ref Range Status   Specimen Description BLOOD RIGHT HAND  Final   Special Requests   Final    BOTTLES DRAWN AEROBIC ONLY Blood Culture adequate volume   Culture   Final    NO GROWTH 5 DAYS Performed at [facility], [address] [place], [place] [address]    Report Status 05/15/2021 FINAL  Final  C Difficile Quick Screen w PCR reflex     Status: None   Collection Time: 05/10/21 11:56 AM   Specimen: STOOL  Result Value Ref Range Status   C Diff antigen NEGATIVE NEGATIVE Final   C Diff toxin NEGATIVE NEGATIVE Final   C Diff interpretation No C. difficile detected.  Final    Comment: Performed at [facility], [address], [place], [place] [address]  Resp Panel by RT-PCR (Flu A&B, Covid) Nasopharyngeal Swab     Status: None   Collection Time: 05/13/21 10:10 AM   Specimen: Nasopharyngeal Swab; Nasopharyngeal(NP) swabs in vial transport medium  Result Value Ref Range Status   SARS Coronavirus 2 by RT PCR NEGATIVE NEGATIVE Final    Comment: (NOTE) SARS-CoV-2 target nucleic acids are NOT DETECTED.  The SARS-CoV-2 RNA is generally detectable in upper respiratory specimens during the acute phase of infection. The lowest concentration of SARS-CoV-2 viral copies this assay can detect is 138 copies/mL. A negative result does not preclude SARS-Cov-2 infection and should not be used as the sole basis for treatment or other patient management decisions. A negative result may occur with  improper specimen collection/handling, submission of specimen other than  nasopharyngeal swab, presence of viral mutation(s) within the areas targeted by this assay, and inadequate number of viral copies(<138 copies/mL). A negative result must be combined with clinical observations, patient history, and epidemiological information. The expected result is Negative.  Fact Sheet for Patients:  EntrepreneurPulse.com.au  Fact Sheet for Healthcare Providers:  IncredibleEmployment.be  This test is no t yet approved or cleared by the [place] FDA and  has been authorized for detection and/or diagnosis of SARS-CoV-2 by FDA under an Emergency Use Authorization (EUA). This EUA will remain  in effect (meaning this test can be used) for the duration of the COVID-19 declaration under Section 564(b)(1) of the Act, 21 U.S.C.section 360bbb-3(b)(1), unless the authorization is terminated  or revoked sooner.       Influenza A by PCR NEGATIVE NEGATIVE Final   Influenza B by PCR NEGATIVE NEGATIVE Final    Comment: (NOTE) The Xpert Xpress SARS-CoV-2/FLU/RSV plus assay is intended as an aid in the diagnosis of influenza from Nasopharyngeal swab specimens and should not be used as a sole basis for treatment. Nasal washings and aspirates are unacceptable for Xpert Xpress SARS-CoV-2/FLU/RSV testing.  Fact Sheet for Patients: EntrepreneurPulse.com.au  Fact Sheet for Healthcare Providers: IncredibleEmployment.be  This test is not yet approved or cleared by the [place] FDA and has been authorized for detection and/or diagnosis of SARS-CoV-2 by FDA under an Emergency Use Authorization (EUA). This EUA will remain in effect (  meaning this test can be used) for the duration of the COVID-19 declaration under Section 564(b)(1) of the Act, 21 U.S.C. section 360bbb-3(b)(1), unless the authorization is terminated or revoked.  Performed at [facility], [address] [place], [place] [address]   SARS  Coronavirus 2 by RT PCR (hospital order, performed in [facility] hospital lab)     Status: None   Collection Time: 05/16/21  1:35 PM  Result Value Ref Range Status   SARS Coronavirus 2 NEGATIVE NEGATIVE Final    Comment: (NOTE) SARS-CoV-2 target nucleic acids are NOT DETECTED.  The SARS-CoV-2 RNA is generally detectable in upper and lower respiratory specimens during the acute phase of infection. The lowest concentration of SARS-CoV-2 viral copies this assay can detect is 250 copies / mL. A negative result does not preclude SARS-CoV-2 infection and should not be used as the sole basis for treatment or other patient management decisions.  A negative result may occur with improper specimen collection / handling, submission of specimen other than nasopharyngeal swab, presence of viral mutation(s) within the areas targeted by this assay, and inadequate number of viral copies (<250 copies / mL). A negative result must be combined with clinical observations, patient history, and epidemiological information.  Fact Sheet for Patients:   StrictlyIdeas.no  Fact Sheet for Healthcare Providers: BankingDealers.co.za  This test is not yet approved or  cleared by the [place] FDA and has been authorized for detection and/or diagnosis of SARS-CoV-2 by FDA under an Emergency Use Authorization (EUA).  This EUA will remain in effect (meaning this test can be used) for the duration of the COVID-19 declaration under Section 564(b)(1) of the Act, 21 U.S.C. section 360bbb-3(b)(1), unless the authorization is terminated or revoked sooner.  Performed at [facility], [address] [place], [place] [address]      Labs: BNP (last 3 results) No results for input(s): BNP in the last 8760 hours. Basic Metabolic Panel: Recent Labs  Lab 05/11/21 0600  NA 140  K 4.4  CL 106  CO2 26  GLUCOSE 96  BUN 19  CREATININE 1.15*  CALCIUM 10.5*   Liver  Function Tests: No results for input(s): AST, ALT, ALKPHOS, BILITOT, PROT, ALBUMIN in the last 168 hours.  No results for input(s): LIPASE, AMYLASE in the last 168 hours. No results for input(s): AMMONIA in the last 168 hours. CBC: Recent Labs  Lab 05/11/21 0600  WBC 6.6  HGB 15.5*  HCT 45.4  MCV 104.1*  PLT 150  Urinalysis    Component Value Date/Time   COLORURINE STRAW (A) 12/23/2020 1621   APPEARANCEUR CLEAR 12/23/2020 1621   APPEARANCEUR Turbid (A) 11/21/2020 1430   LABSPEC 1.010 12/23/2020 1621   PHURINE 6.0 12/23/2020 1621   GLUCOSEU NEGATIVE 12/23/2020 1621   GLUCOSEU NEG mg/dL 09/20/2007 1114   HGBUR NEGATIVE 12/23/2020 1621   HGBUR moderate 06/04/2010 1425   BILIRUBINUR NEGATIVE 12/23/2020 1621   BILIRUBINUR Negative 11/21/2020 1430   KETONESUR NEGATIVE 12/23/2020 1621   PROTEINUR 30 (A) 12/23/2020 1621   UROBILINOGEN 1.0 05/31/2014 0933   NITRITE NEGATIVE 12/23/2020 1621   LEUKOCYTESUR NEGATIVE 12/23/2020 1621   Sepsis Labs Invalid input(s): PROCALCITONIN,  WBC,  LACTICIDVEN Microbiology Recent Results (from the past 240 hour(s))  Resp Panel by RT-PCR (Flu A&B, Covid) Nasopharyngeal Swab     Status: None   Collection Time: 05/09/21 10:00 PM   Specimen: Nasopharyngeal Swab; Nasopharyngeal(NP) swabs in vial transport medium  Result Value Ref Range Status   SARS Coronavirus 2  by RT PCR NEGATIVE NEGATIVE Final    Comment: (NOTE) SARS-CoV-2 target nucleic acids are NOT DETECTED.  The SARS-CoV-2 RNA is generally detectable in upper respiratory specimens during the acute phase of infection. The lowest concentration of SARS-CoV-2 viral copies this assay can detect is 138 copies/mL. A negative result does not preclude SARS-Cov-2 infection and should not be used as the sole basis for treatment or other patient management decisions. A negative result may occur with  improper specimen collection/handling, submission of specimen other than nasopharyngeal swab,  presence of viral mutation(s) within the areas targeted by this assay, and inadequate number of viral copies(<138 copies/mL). A negative result must be combined with clinical observations, patient history, and epidemiological information. The expected result is Negative.  Fact Sheet for Patients:  EntrepreneurPulse.com.au  Fact Sheet for Healthcare Providers:  IncredibleEmployment.be  This test is no t yet approved or cleared by the [place] FDA and  has been authorized for detection and/or diagnosis of SARS-CoV-2 by FDA under an Emergency Use Authorization (EUA). This EUA will remain  in effect (meaning this test can be used) for the duration of the COVID-19 declaration under Section 564(b)(1) of the Act, 21 U.S.C.section 360bbb-3(b)(1), unless the authorization is terminated  or revoked sooner.       Influenza A by PCR NEGATIVE NEGATIVE Final   Influenza B by PCR NEGATIVE NEGATIVE Final    Comment: (NOTE) The Xpert Xpress SARS-CoV-2/FLU/RSV plus assay is intended as an aid in the diagnosis of influenza from Nasopharyngeal swab specimens and should not be used as a sole basis for treatment. Nasal washings and aspirates are unacceptable for Xpert Xpress SARS-CoV-2/FLU/RSV testing.  Fact Sheet for Patients: EntrepreneurPulse.com.au  Fact Sheet for Healthcare Providers: IncredibleEmployment.be  This test is not yet approved or cleared by the [place] FDA and has been authorized for detection and/or diagnosis of SARS-CoV-2 by FDA under an Emergency Use Authorization (EUA). This EUA will remain in effect (meaning this test can be used) for the duration of the COVID-19 declaration under Section 564(b)(1) of the Act, 21 U.S.C. section 360bbb-3(b)(1), unless the authorization is terminated or revoked.  Performed at [facility], [address], [place], [place] [address]   Expectorated Sputum  Assessment w Gram Stain, Rflx to Resp Cult     Status: None   Collection Time: 05/10/21  2:26 AM   Specimen: Expectorated Sputum  Result Value Ref Range Status   Specimen Description   Final    EXPECTORATED SPUTUM Performed at [facility], [address] [place], [place] [address]    Special Requests   Final    NONE Performed at [facility], [address] [place], [place] [address]    Sputum evaluation   Final    THIS SPECIMEN IS ACCEPTABLE FOR SPUTUM CULTURE Performed at [facility], [place] [address], [place], [place] [address]    Report Status 05/10/2021 FINAL  Final  Culture, Respiratory w Gram Stain     Status: None   Collection Time: 05/10/21  2:26 AM  Result Value Ref Range Status   Specimen Description EXPECTORATED SPUTUM  Final   Special Requests NONE Reflexed from [phone]  Final   Gram Stain   Final    FEW WBC PRESENT,BOTH PMN AND MONONUCLEAR FEW GRAM NEGATIVE RODS FEW GRAM VARIABLE ROD RARE GRAM POSITIVE COCCI    Culture   Final    FEW Normal respiratory flora-no Staph aureus or Pseudomonas seen Performed at [facility], [place]  [address] [place] [address], [place], [place] [address]    Report Status 05/13/2021 FINAL  Final  Culture, blood (Routine X 2) w Reflex to ID Panel     Status: None   Collection Time: 05/10/21  3:40 AM   Specimen: Right Antecubital; Blood  Result Value Ref Range Status   Specimen Description RIGHT ANTECUBITAL  Final   Special Requests   Final    BOTTLES DRAWN AEROBIC AND ANAEROBIC Blood Culture adequate volume   Culture   Final    NO GROWTH 5 DAYS Performed at [facility], [address] [place], [place] [address]    Report Status 05/15/2021 FINAL  Final  Culture, blood (Routine X 2) w Reflex to ID Panel     Status: None   Collection Time: 05/10/21  3:48 AM   Specimen: BLOOD RIGHT HAND  Result Value Ref Range Status   Specimen Description BLOOD RIGHT HAND  Final   Special Requests   Final    BOTTLES DRAWN AEROBIC ONLY Blood Culture  adequate volume   Culture   Final    NO GROWTH 5 DAYS Performed at [facility], [address] [place], [place] [address]    Report Status 05/15/2021 FINAL  Final  C Difficile Quick Screen w PCR reflex     Status: None   Collection Time: 05/10/21 11:56 AM   Specimen: STOOL  Result Value Ref Range Status   C Diff antigen NEGATIVE NEGATIVE Final   C Diff toxin NEGATIVE NEGATIVE Final   C Diff interpretation No C. difficile detected.  Final    Comment: Performed at [facility], [address], [place],  [address]  Resp Panel by RT-PCR (Flu A&B, Covid) Nasopharyngeal Swab     Status: None   Collection Time: 05/13/21 10:10 AM   Specimen: Nasopharyngeal Swab; Nasopharyngeal(NP) swabs in vial transport medium  Result Value Ref Range Status   SARS Coronavirus 2 by RT PCR NEGATIVE NEGATIVE Final    Comment: (NOTE) SARS-CoV-2 target nucleic acids are NOT DETECTED.  The SARS-CoV-2 RNA is generally detectable in upper respiratory specimens during the acute phase of infection. The lowest concentration of SARS-CoV-2 viral copies this assay can detect is 138 copies/mL. A negative result does not preclude SARS-Cov-2 infection and should not be used as the sole basis for treatment or other patient management decisions. A negative result may occur with  improper specimen collection/handling, submission of specimen other than nasopharyngeal swab, presence of viral mutation(s) within the areas targeted by this assay, and inadequate number of viral copies(<138 copies/mL). A negative result must be combined with clinical observations, patient history, and epidemiological information. The expected result is Negative.  Fact Sheet for Patients:  EntrepreneurPulse.com.au  Fact Sheet for Healthcare Providers:  IncredibleEmployment.be  This test is no t yet approved or cleared by the [place] FDA and  has been authorized for detection and/or diagnosis of  SARS-CoV-2 by FDA under an Emergency Use Authorization (EUA). This EUA will remain  in effect (meaning this test can be used) for the duration of the COVID-19 declaration under Section 564(b)(1) of the Act, 21 U.S.C.section 360bbb-3(b)(1), unless the authorization is terminated  or revoked sooner.       Influenza A by PCR NEGATIVE NEGATIVE Final   Influenza B by PCR NEGATIVE NEGATIVE Final    Comment: (NOTE) The Xpert Xpress SARS-CoV-2/FLU/RSV plus assay is intended as an aid in the diagnosis of influenza from Nasopharyngeal swab specimens and should not be used as a sole basis for treatment.  Nasal washings and aspirates are unacceptable for Xpert Xpress SARS-CoV-2/FLU/RSV testing.  Fact Sheet for Patients: EntrepreneurPulse.com.au  Fact Sheet for Healthcare Providers: IncredibleEmployment.be  This test is not yet approved or cleared by the [place] FDA and has been authorized for detection and/or diagnosis of SARS-CoV-2 by FDA under an Emergency Use Authorization (EUA). This EUA will remain in effect (meaning this test can be used) for the duration of the COVID-19 declaration under Section 564(b)(1) of the Act, 21 U.S.C. section 360bbb-3(b)(1), unless the authorization is terminated or revoked.  Performed at [facility], [address] [place], [place] [address]   SARS Coronavirus 2 by RT PCR (hospital order, performed in [place] hospital lab)     Status: None   Collection Time: 05/16/21  1:35 PM  Result Value Ref Range Status   SARS Coronavirus 2 NEGATIVE NEGATIVE Final    Comment: (NOTE) SARS-CoV-2 target nucleic acids are NOT DETECTED.  The SARS-CoV-2 RNA is generally detectable in upper and lower respiratory specimens during the acute phase of infection. The lowest concentration of SARS-CoV-2 viral copies this assay can detect is 250 copies / mL. A negative result does not preclude SARS-CoV-2 infection and should not be  used as the sole basis for treatment or other patient management decisions.  A negative result may occur with improper specimen collection / handling, submission of specimen other than nasopharyngeal swab, presence of viral mutation(s) within the areas targeted by this assay, and inadequate number of viral copies (<250 copies / mL). A negative result must be combined with clinical observations, patient history, and epidemiological information.  Fact Sheet for Patients:   StrictlyIdeas.no  Fact Sheet for Healthcare Providers: BankingDealers.co.za  This test is not yet approved or  cleared by the [place] FDA and has been authorized for detection and/or diagnosis of SARS-CoV-2 by FDA under an Emergency Use Authorization (EUA).  This EUA will remain in effect (meaning this test can be used) for the duration of the COVID-19 declaration under Section 564(b)(1) of the Act, 21 U.S.C. section 360bbb-3(b)(1), unless the authorization is terminated or revoked sooner.  Performed at [facility], [address], [place], [place] [address]      Patient was seen and examined on the day of discharge and was found to be in stable condition. Time coordinating discharge: 45 minutes including assessment and coordination of care, as well as examination of the patient.   SIGNED:  Deatra James, DO [facility] 05/17/2021, 11:40 AM

## 2021-05-17 NOTE — TOC Transition Note (Signed)
Transition of Care [facility]) - CM/SW Discharge Note   Patient Details  Name: [name] MRN: [id number] Date of Birth: June 15, 1941  Transition of Care [facility] CM/SW Contact:  Natasha Bence, LCSW Phone Number: 05/17/2021, 12:53 PM   Clinical Narrative:    CSW notified of patient's readiness for discharge. CSW contacted [place]. [facility] agreeable to take patient. CSW faxed discharge summary and Covid test results. CSW also completed med necessity and called EMS. Nurse to call report. TOC signing off.   Final next level of care: Skilled Nursing Facility Barriers to Discharge: Barriers Resolved   Patient Goals and CMS Choice Patient states their goals for this hospitalization and ongoing recovery are:: Rehab with SNF CMS Medicare.gov Compare Post Acute Care list provided to:: Patient Choice offered to / list presented to : Patient  Discharge Placement              Patient chooses bed at: [facility] Patient to be transferred to facility by: [facility] Name of family member notified: [phone] Patient and family notified of of transfer: 05/17/21  Discharge Plan and Services                                     Social Determinants of Health ([place]) Interventions     Readmission Risk Interventions Readmission Risk Prevention Plan 05/11/2021  Transportation Screening Complete  PCP or Specialist Appt within 3-5 Days Complete  HRI or Home Care Consult Complete  Social Work Consult for [place] Planning/Counseling Complete  Palliative Care Screening Not Applicable  Medication Review Press photographer) Complete  Some recent data might be hidden

## 2021-06-12 ENCOUNTER — Ambulatory Visit: Payer: Medicare Other | Admitting: Infectious Disease

## 2021-06-20 ENCOUNTER — Other Ambulatory Visit: Payer: Self-pay

## 2021-06-20 ENCOUNTER — Encounter (HOSPITAL_COMMUNITY): Payer: Self-pay

## 2021-06-20 ENCOUNTER — Emergency Department (HOSPITAL_COMMUNITY): Payer: Medicare Other

## 2021-06-20 ENCOUNTER — Inpatient Hospital Stay (HOSPITAL_COMMUNITY)
Admission: EM | Admit: 2021-06-20 | Discharge: 2021-06-26 | DRG: 389 | Disposition: A | Discharge status: Skilled Nursing Facility | Payer: Medicare Other | Attending: Internal Medicine | Admitting: Internal Medicine

## 2021-06-20 DIAGNOSIS — I10 Essential (primary) hypertension: Secondary | ICD-10-CM | POA: Diagnosis present

## 2021-06-20 DIAGNOSIS — Z885 Allergy status to narcotic agent status: Secondary | ICD-10-CM

## 2021-06-20 DIAGNOSIS — Z79899 Other long term (current) drug therapy: Secondary | ICD-10-CM | POA: Diagnosis not present

## 2021-06-20 DIAGNOSIS — Z8659 Personal history of other mental and behavioral disorders: Secondary | ICD-10-CM

## 2021-06-20 DIAGNOSIS — G629 Polyneuropathy, unspecified: Secondary | ICD-10-CM | POA: Diagnosis present

## 2021-06-20 DIAGNOSIS — R112 Nausea with vomiting, unspecified: Secondary | ICD-10-CM

## 2021-06-20 DIAGNOSIS — R109 Unspecified abdominal pain: Secondary | ICD-10-CM | POA: Diagnosis present

## 2021-06-20 DIAGNOSIS — R718 Other abnormality of red blood cells: Secondary | ICD-10-CM | POA: Diagnosis present

## 2021-06-20 DIAGNOSIS — B20 Human immunodeficiency virus [HIV] disease: Secondary | ICD-10-CM | POA: Diagnosis present

## 2021-06-20 DIAGNOSIS — B964 Proteus (mirabilis) (morganii) as the cause of diseases classified elsewhere: Secondary | ICD-10-CM | POA: Diagnosis present

## 2021-06-20 DIAGNOSIS — Z8674 Personal history of sudden cardiac arrest: Secondary | ICD-10-CM | POA: Diagnosis not present

## 2021-06-20 DIAGNOSIS — N390 Urinary tract infection, site not specified: Secondary | ICD-10-CM | POA: Diagnosis present

## 2021-06-20 DIAGNOSIS — R17 Unspecified jaundice: Secondary | ICD-10-CM | POA: Diagnosis present

## 2021-06-20 DIAGNOSIS — F32A Depression, unspecified: Secondary | ICD-10-CM | POA: Diagnosis present

## 2021-06-20 DIAGNOSIS — E039 Hypothyroidism, unspecified: Secondary | ICD-10-CM | POA: Diagnosis present

## 2021-06-20 DIAGNOSIS — Z8673 Personal history of transient ischemic attack (TIA), and cerebral infarction without residual deficits: Secondary | ICD-10-CM | POA: Diagnosis not present

## 2021-06-20 DIAGNOSIS — K56609 Unspecified intestinal obstruction, unspecified as to partial versus complete obstruction: Secondary | ICD-10-CM | POA: Diagnosis present

## 2021-06-20 DIAGNOSIS — R1084 Generalized abdominal pain: Secondary | ICD-10-CM

## 2021-06-20 DIAGNOSIS — Z888 Allergy status to other drugs, medicaments and biological substances status: Secondary | ICD-10-CM

## 2021-06-20 DIAGNOSIS — Z882 Allergy status to sulfonamides status: Secondary | ICD-10-CM

## 2021-06-20 DIAGNOSIS — E876 Hypokalemia: Secondary | ICD-10-CM | POA: Diagnosis present

## 2021-06-20 DIAGNOSIS — K56600 Partial intestinal obstruction, unspecified as to cause: Principal | ICD-10-CM | POA: Diagnosis present

## 2021-06-20 DIAGNOSIS — F209 Schizophrenia, unspecified: Secondary | ICD-10-CM | POA: Diagnosis present

## 2021-06-20 DIAGNOSIS — N179 Acute kidney failure, unspecified: Secondary | ICD-10-CM | POA: Diagnosis present

## 2021-06-20 DIAGNOSIS — Z91030 Bee allergy status: Secondary | ICD-10-CM

## 2021-06-20 DIAGNOSIS — J449 Chronic obstructive pulmonary disease, unspecified: Secondary | ICD-10-CM | POA: Diagnosis present

## 2021-06-20 DIAGNOSIS — N189 Chronic kidney disease, unspecified: Secondary | ICD-10-CM | POA: Diagnosis present

## 2021-06-20 DIAGNOSIS — Z20822 Contact with and (suspected) exposure to covid-19: Secondary | ICD-10-CM | POA: Diagnosis present

## 2021-06-20 DIAGNOSIS — Z0189 Encounter for other specified special examinations: Secondary | ICD-10-CM

## 2021-06-20 DIAGNOSIS — N184 Chronic kidney disease, stage 4 (severe): Secondary | ICD-10-CM | POA: Diagnosis present

## 2021-06-20 DIAGNOSIS — F419 Anxiety disorder, unspecified: Secondary | ICD-10-CM | POA: Diagnosis present

## 2021-06-20 DIAGNOSIS — Z21 Asymptomatic human immunodeficiency virus [HIV] infection status: Secondary | ICD-10-CM | POA: Diagnosis present

## 2021-06-20 DIAGNOSIS — I129 Hypertensive chronic kidney disease with stage 1 through stage 4 chronic kidney disease, or unspecified chronic kidney disease: Secondary | ICD-10-CM | POA: Diagnosis present

## 2021-06-20 DIAGNOSIS — E860 Dehydration: Secondary | ICD-10-CM | POA: Diagnosis present

## 2021-06-20 DIAGNOSIS — E162 Hypoglycemia, unspecified: Secondary | ICD-10-CM | POA: Diagnosis present

## 2021-06-20 LAB — CBC WITH DIFFERENTIAL/PLATELET
Abs Immature Granulocytes: 0.02 10*3/uL (ref 0.00–0.07)
Basophils Absolute: 0 10*3/uL (ref 0.0–0.1)
Basophils Relative: 0 %
Eosinophils Absolute: 0 10*3/uL (ref 0.0–0.5)
Eosinophils Relative: 0 %
HCT: 40.5 % (ref 36.0–46.0)
Hemoglobin: 14.1 g/dL (ref 12.0–15.0)
Immature Granulocytes: 0 %
Lymphocytes Relative: 18 %
Lymphs Abs: 1.9 10*3/uL (ref 0.7–4.0)
MCH: 36.7 pg — ABNORMAL HIGH (ref 26.0–34.0)
MCHC: 34.8 g/dL (ref 30.0–36.0)
MCV: 105.5 fL — ABNORMAL HIGH (ref 80.0–100.0)
Monocytes Absolute: 0.6 10*3/uL (ref 0.1–1.0)
Monocytes Relative: 6 %
Neutro Abs: 8 10*3/uL — ABNORMAL HIGH (ref 1.7–7.7)
Neutrophils Relative %: 76 %
Platelets: 218 10*3/uL (ref 150–400)
RBC: 3.84 MIL/uL — ABNORMAL LOW (ref 3.87–5.11)
RDW: 12.5 % (ref 11.5–15.5)
WBC: 10.5 10*3/uL (ref 4.0–10.5)
nRBC: 0 % (ref 0.0–0.2)

## 2021-06-20 LAB — COMPREHENSIVE METABOLIC PANEL
ALT: 22 U/L (ref 0–44)
AST: 30 U/L (ref 15–41)
Albumin: 4.2 g/dL (ref 3.5–5.0)
Alkaline Phosphatase: 71 U/L (ref 38–126)
Anion gap: 9 (ref 5–15)
BUN: 42 mg/dL — ABNORMAL HIGH (ref 8–23)
CO2: 34 mmol/L — ABNORMAL HIGH (ref 22–32)
Calcium: 11.1 mg/dL — ABNORMAL HIGH (ref 8.9–10.3)
Chloride: 91 mmol/L — ABNORMAL LOW (ref 98–111)
Creatinine, Ser: 1.8 mg/dL — ABNORMAL HIGH (ref 0.44–1.00)
GFR, Estimated: 28 mL/min — ABNORMAL LOW (ref >60)
Glucose, Bld: 130 mg/dL — ABNORMAL HIGH (ref 70–99)
Potassium: 4.7 mmol/L (ref 3.5–5.1)
Sodium: 134 mmol/L — ABNORMAL LOW (ref 135–145)
Total Bilirubin: 1.4 mg/dL — ABNORMAL HIGH (ref 0.3–1.2)
Total Protein: 8 g/dL (ref 6.5–8.1)

## 2021-06-20 LAB — URINALYSIS, ROUTINE W REFLEX MICROSCOPIC
Bilirubin Urine: NEGATIVE
Glucose, UA: NEGATIVE mg/dL
Hgb urine dipstick: NEGATIVE
Ketones, ur: NEGATIVE mg/dL
Nitrite: NEGATIVE
Protein, ur: 100 mg/dL — AB
Specific Gravity, Urine: 1.018 (ref 1.005–1.030)
pH: 8 (ref 5.0–8.0)

## 2021-06-20 LAB — RESP PANEL BY RT-PCR (FLU A&B, COVID) ARPGX2
Influenza A by PCR: NEGATIVE
Influenza B by PCR: NEGATIVE
SARS Coronavirus 2 by RT PCR: NEGATIVE

## 2021-06-20 LAB — LIPASE, BLOOD: Lipase: 29 U/L (ref 11–51)

## 2021-06-20 MED ORDER — ONDANSETRON HCL 4 MG/2ML IJ SOLN
4.0000 mg | Freq: Once | INTRAMUSCULAR | Status: AC
  Administered 2021-06-20: 4 mg via INTRAVENOUS
  Filled 2021-06-20: qty 2

## 2021-06-20 MED ORDER — FENTANYL CITRATE PF 50 MCG/ML IJ SOSY
25.0000 ug | PREFILLED_SYRINGE | Freq: Once | INTRAMUSCULAR | Status: AC
  Administered 2021-06-21: 25 ug via INTRAVENOUS
  Filled 2021-06-20: qty 1

## 2021-06-20 NOTE — H&P (Signed)
History and Physical  [name] IWP:[id number] DOB: Dec 19, 1940 DOA: 06/20/2021  Referring physician: Rodney Booze, PA-C PCP: Wannetta Sender, FNP  Patient coming from: Home  Chief Complaint: Vomiting  HPI: [name] is an 80 y.o. adult with medical history significant for   peripheral neuropathy, DDD, depression, COPD, CKD stage IIIB, hypertension, HIV infection presents to the emergency department due to 4-day onset of nonbloody, but bilious vomiting.  Vomitus was 5-6 episodes daily and this was associated with sharp abdominal pain which was intermittent and was rated as 8/10 on pain scale with no known alleviating/aggravating factors.  She states that she has not been able to keep any food down.  Last bowel movement was yesterday and it was of normal consistency.  She complained of lightheadedness and dizziness, but she denies fever, chills, chest pain, shortness of breath. She was recently admitted from 7/15 to 05/17/2021 due to CAP POA which was treated with ceftriaxone, azithromycin and Flagyl and was transitioned to Augmentin on discharge.  ED Course:  In the emergency department, he was hemodynamically stable.  Work-up in the ED showed normal CBC except for elevated MCV at 205.5, total bilirubin 1.4, calcium 11.1, BUN alacrity and 42/1.80 (baseline creatinine at 1.2-1.4).  Influenza A, B, SARS coronavirus 2 was negative. CT abdomen and pelvis without contrast was suspicious for proximal bowel obstruction. She was treated with IV fentanyl 25 mcg x 1, Zofran was given.  Hospitalist was asked to admit patient for further evaluation and management.  Review of Systems: Constitutional: Negative for chills and fever.  HENT: Negative for ear pain and sore throat.   Eyes: Negative for pain and visual disturbance.  Respiratory: Negative for cough, chest tightness and shortness of breath.   Cardiovascular: Negative for chest pain and palpitations.  Gastrointestinal:  Positive for abdominal pain, nausea and vomiting.  Endocrine: Negative for polyphagia and polyuria.  Genitourinary: Negative for decreased urine volume, dysuria, enuresis Musculoskeletal: Negative for arthralgias and back pain.  Skin: Negative for color change and rash.  Allergic/Immunologic: Negative for immunocompromised state.  Neurological: Positive for lightheadedness and dizziness.  Negative for tremors, syncope, speech difficulty Hematological: Does not bruise/bleed easily.  All other systems reviewed and are negative   Past Medical History:  Diagnosis Date   Anxiety    Arthritis    Bronchitis    Bronchitis    CKD (chronic kidney disease) stage 3, GFR 30-59 ml/min (HCC) 02/28/3975   Complication of anesthesia    pt states she had a cardiac arrest during hysterectomy 1985.  Pt had several surgies since then that were uneventful   COPD (chronic obstructive pulmonary disease) ([place])    Depression    Dysuria 03/27/2019   Elevated LFTs 05/04/2016   History of TIAs 1981   left side weakness   HIV (human immunodeficiency virus infection) ([place])    Hypertension    Kidney disease related to HIV infection [facility])    pt states she has to have her creatinine level checked often    Moderate single current episode of major depressive disorder ([place]) 03/12/2021   Nausea 03/03/2017   Peripheral neuropathy    bilateral feet   Seasonal allergies 03/27/2019   Sinusitis 05/06/2018   UTI (urinary tract infection) 03/03/2017   Weight loss 03/03/2017   Past Surgical History:  Procedure Laterality Date   ABDOMINAL HYSTERECTOMY  1985   BACK SURGERY     BIOPSY  07/15/2020   Procedure: BIOPSY;  Surgeon: Daneil Dolin, MD;  [facility];  Service: Endoscopy;;   CATARACT EXTRACTION, BILATERAL     CHOLECYSTECTOMY     COLONOSCOPY  08/10/2002   NUR: Normal colonoscopy except for some hemorrhoids and some erythema at the dentate line   COLONOSCOPY  12/10/2006   Edwards:Diffuse colitis in the  transverse and descending colon/This is not entirely typical of ischemic colitis or her HIV positive/ disease.  We need to be concerned about other causes   COLONOSCOPY  01/19/2011   RMR: Internal and external hemorrhoids, likely source of hematochezia anal papilla, otherwise normal rectal mucosa/ Left-sided diverticula.  Cecal polyp with status post cold snare polypectomy.  Remainder of colonic mucosa appeared normal. Pathology with tubular adenoma. Repeat in 2017   COLONOSCOPY WITH PROPOFOL N/A 07/15/2020   Procedure: COLONOSCOPY WITH PROPOFOL;  Surgeon: Daneil Dolin, MD;  Location: [facility];  Service: Endoscopy;  Laterality: N/A;  8:15AM TCS via Ostomy   COLOSTOMY N/A 11/12/2018   Procedure: COLOSTOMY;  Surgeon: Virl Cagey, MD;  Location: [facility];  Service: General;  Laterality: N/A;   COLOSTOMY REVERSAL N/A 10/02/2020   Procedure: COLOSTOMY REVERSAL;  Surgeon: Virl Cagey, MD;  Location: [facility] OR[facility];  Service: General;  Laterality: N/A;   ESOPHAGOGASTRODUODENOSCOPY  08/10/2002     NUR: Mild changes of reflux esophagitis limited to gastroesophageal junction  No evidence of ring, stricture, or esophageal candidiasis dysphagia/ Gastritis, possibly H. pylori induced   ESOPHAGOGASTRODUODENOSCOPY  12/25/2002   NUR: Erosive antral gastritis.  The degree of gastritis is more significant than her last exam/ Normal examination of the esophagus/ Esophageal dilatation performed by passing 4 and [address] [place]   ESOPHAGOGASTRODUODENOSCOPY  11/13/2010   RMR: Circumferential distal esophageal erosions with soft peptic stricture, consistent with erosive reflux esophagitis or stricture  formation, status post dilation as described above/  Small hiatal hernia/ Tiny antral erosions, otherwise normal stomach D1 and D2   ESOPHAGOGASTRODUODENOSCOPY (EGD) WITH PROPOFOL N/A 07/15/2020   Procedure: ESOPHAGOGASTRODUODENOSCOPY (EGD) WITH PROPOFOL;  Surgeon: Daneil Dolin, MD;  [facility];   Service: Endoscopy;  Laterality: N/A;   FLEXIBLE SIGMOIDOSCOPY N/A 07/15/2020   Procedure: FLEXIBLE SIGMOIDOSCOPY;  Surgeon: Daneil Dolin, MD;  [facility];  Service: Endoscopy;  Laterality: N/A;   HEMORRHOID SURGERY  09/02/2012   Procedure: HEMORRHOIDECTOMY;  Surgeon: Jamesetta So, MD;  Location: [facility];  Service: General;  Laterality: N/A;   LAPAROTOMY N/A 11/12/2018   Procedure: EXPLORATORY LAPAROTOMY;  Surgeon: Virl Cagey, MD;  Location: [facility];  Service: General;  Laterality: N/A;   MALONEY DILATION N/A 07/15/2020   Procedure: Keturah Shavers;  Surgeon: Daneil Dolin, MD;  [facility];  Service: Endoscopy;  Laterality: N/A;   PARTIAL COLECTOMY N/A 11/12/2018   Procedure: PARTIAL COLECTOMY;  Surgeon: Virl Cagey, MD;  Location: [facility];  Service: General;  Laterality: N/A;   POLYPECTOMY  07/15/2020   Procedure: POLYPECTOMY;  Surgeon: Daneil Dolin, MD;  [facility];  Service: Endoscopy;;   TUBAL LIGATION      Social History:  reports that she has never smoked. She has never used smokeless tobacco. She reports that she does not drink alcohol and does not use drugs.   Allergies  Allergen Reactions   Bee Venom Anaphylaxis   Promethazine Swelling   Tenofovir Disoproxil Fumarate [Tenofovir Disoproxil Fumarate] Other (See Comments)    Renal failure, (08/27/2006)   Compazine  [Prochlorperazine Edisylate] Nausea And Vomiting   Haloperidol Lactate  Other (See Comments)    Reaction is muscle tension, causes severe spasms in face and neck. Forces eyes to roll in the back of the head   Sulfa Antibiotics Diarrhea   Brompheniramine-Acetaminophen    Chlorcyclizine    Chlorpromazine    Codeine Itching and Nausea And Vomiting   Navane [Thiothixene] Other (See Comments) and Nausea And Vomiting    Same muscle spasm reaction as haldol   Prednisone     Brief mild psychosis and agitation   Promethazine Hcl Other (See Comments)   Propoxyphene  N-Acetaminophen Itching and Nausea And Vomiting   Morphine Itching and Swelling    Family History  Problem Relation Age of Onset   Diabetes Mother    Diabetes Brother    Colon cancer Brother        Diagnosed > age 107      Prior to Admission medications   Medication Sig Start Date End Date Taking? Authorizing Provider  abacavir-dolutegravir-lamiVUDine (TRIUMEQ) 600-50-300 MG tablet Take 1 tablet by mouth daily. 03/12/21   Truman Hayward, MD  acetaminophen (TYLENOL) 500 MG tablet Take 2 tablets (1,000 mg total) by mouth every 6 (six) hours as needed for mild pain or moderate pain. 10/08/20   Virl Cagey, MD  albuterol (PROVENTIL HFA;VENTOLIN HFA) 108 (90 Base) MCG/ACT inhaler Inhale 1-2 puffs into the lungs every 6 (six) hours as needed for wheezing or shortness of breath. 04/02/17   Nat Christen, MD  ALLERGY RELIEF 10 MG tablet Take 10 mg by mouth daily. 05/02/21   [provider]  Cholecalciferol (VITAMIN D3) 10000 units TABS Take 1 tablet by mouth daily.    [provider]  clonazePAM (KLONOPIN) 1 MG tablet Take 1 tablet (1 mg total) by mouth 2 (two) times daily as needed for anxiety. 05/12/21   Dessa Phi, DO  diphenhydrAMINE (BENADRYL) 25 mg capsule Take 25 mg by mouth daily as needed for itching (itching from codeine).     [provider]  docusate sodium (COLACE) 100 MG capsule Take 1 capsule (100 mg total) by mouth 2 (two) times daily. 11/21/18   Virl Cagey, MD  DULoxetine (CYMBALTA) 60 MG capsule Take 60 mg by mouth daily. 02/28/21   [provider]  fluticasone (FLONASE) 50 MCG/ACT nasal spray 2 SPRAYS INTO BOTH NOSTRILS AT BEDTIME. 05/22/19   [name], Lavell Islam, MD  furosemide (LASIX) 40 MG tablet Take 1 tablet (40 mg total) by mouth daily as needed for fluid. Hold until follow up with PCP 12/31/19   Barton Dubois, MD  haloperidol (HALDOL) 10 MG tablet Take 10 mg by mouth at bedtime. 04/23/21   [provider]   ipratropium-albuterol (DUONEB) 0.5-2.5 (3) MG/3ML SOLN Inhale 3 mLs into the lungs every 6 (six) hours as needed (shortness of breath).  04/02/17   [provider]  labetalol (NORMODYNE) 100 MG tablet Take 1 tablet (100 mg total) by mouth 2 (two) times daily. 05/17/21 06/16/21  ShahmehdiValeria Batman, MD  losartan (COZAAR) 25 MG tablet Take 12.5 mg by mouth daily. 05/02/21   [provider]  magnesium 30 MG tablet Take 30 mg by mouth at bedtime.     [provider]  Multiple Vitamin (ONE-A-DAY 55 PLUS PO) Take 1 tablet by mouth daily.    [provider]  nystatin (MYCOSTATIN/NYSTOP) powder Apply 1 application topically 3 (three) times daily. Apply to groin region until redness and irritation improving 10/15/20   Virl Cagey, MD  Omega-3 Fatty  Acids (FISH OIL BURP-LESS) 1000 MG CAPS Take 1 capsule by mouth daily.    [provider]  ondansetron (ZOFRAN) 8 MG tablet Take 8 mg by mouth daily as needed for nausea or vomiting.    [provider]  pantoprazole (PROTONIX) 40 MG tablet Take 1 tablet (40 mg total) by mouth daily before breakfast. 07/16/20 07/16/21  Erenest Rasher, PA-C  polyvinyl alcohol (LIQUIFILM TEARS) 1.4 % ophthalmic solution Place 1 drop into both eyes daily.    [provider]  psyllium (HYDROCIL/METAMUCIL) 95 % PACK Take 1 packet by mouth daily. 10/09/20   Virl Cagey, MD  thiamine (VITAMIN B-1) 100 MG tablet Take 100 mg by mouth daily.    [provider]  XTAMPZA ER 13.5 MG C12A Take 1 capsule by mouth 2 (two) times daily. 05/12/21   Dessa Phi, DO    Physical Exam: BP 121/60   Pulse 71   Temp 98.8 F (37.1 C)   Resp 20   Ht 5\' 2"  (1.575 m)   Wt 65.3 kg   SpO2 96%   BMI 26.34 kg/m   General: 80 y.o. year-old adult well developed well nourished in no acute distress.  Alert and oriented x3. HEENT: Dry mucous membrane.  NCAT, EOMI Neck: Supple, trachea medial Cardiovascular: Regular rate and  rhythm with no rubs or gallops.  No thyromegaly or JVD noted.  No lower extremity edema. 2/4 pulses in all 4 extremities. Respiratory: Clear to auscultation with no wheezes or rales. Good inspiratory effort. Abdomen: Soft, tender to palpation of epigastric and umbilical area without guarding.   Muskuloskeletal: No cyanosis, clubbing or edema noted bilaterally Neuro: CN II-XII intact, strength 5/5 x 4, sensation, reflexes intact Skin: No ulcerative lesions noted or rashes Psychiatry: Judgement and insight appear normal. Mood is appropriate for condition and setting          Labs on Admission:  Basic Metabolic Panel: Recent Labs  Lab 06/20/21 2102  NA 134*  K 4.7  CL 91*  CO2 34*  GLUCOSE 130*  BUN 42*  CREATININE 1.80*  CALCIUM 11.1*   Liver Function Tests: Recent Labs  Lab 06/20/21 2102  AST 30  ALT 22  ALKPHOS 71  BILITOT 1.4*  PROT 8.0  ALBUMIN 4.2   Recent Labs  Lab 06/20/21 2102  LIPASE 29   No results for input(s): AMMONIA in the last 168 hours. CBC: Recent Labs  Lab 06/20/21 2102  WBC 10.5  NEUTROABS 8.0*  HGB 14.1  HCT 40.5  MCV 105.5*  PLT 218   Cardiac Enzymes: No results for input(s): CKTOTAL, CKMB, CKMBINDEX, TROPONINI in the last 168 hours.  BNP (last 3 results) No results for input(s): BNP in the last 8760 hours.  ProBNP (last 3 results) No results for input(s): PROBNP in the last 8760 hours.  CBG: No results for input(s): GLUCAP in the last 168 hours.  Radiological Exams on Admission: CT ABDOMEN PELVIS WO CONTRAST  Result Date: 06/20/2021 CLINICAL DATA:  Abdomen pain vomiting EXAM: CT ABDOMEN AND PELVIS WITHOUT CONTRAST TECHNIQUE: Multidetector CT imaging of the abdomen and pelvis was performed following the standard protocol without IV contrast. COMPARISON:  CT 05/28/2020, 11/12/2018 FINDINGS: Lower chest: Lung bases demonstrate no acute consolidation or effusion. Atelectasis or scarring at the bases. Coronary vascular calcification.  Hepatobiliary: No focal liver abnormality is seen. Status post cholecystectomy. No biliary dilatation. Pancreas: Unremarkable. No pancreatic ductal dilatation or surrounding inflammatory changes. Spleen: Normal in size without focal abnormality. Adrenals/Urinary Tract:  Right adrenal gland is normal. 19 mm left adrenal nodule, stable consistent with adenoma. Kidneys show no hydronephrosis. The bladder is normal Stomach/Bowel: Moderate fluid distension of the stomach. Dilated fluid-filled duodenum and proximal jejunum. Slightly swirled appearance of mesentery and bowel within the right mid abdomen, series 2, image 53 with relatively decompressed remainder of the distal small bowel. No acute bowel wall thickening. Large volume of stool in the colon and rectum. Interval takedown of left abdominal colostomy with reanastomosis at the sigmoid colon. Possible mild: Narrowing slightly distal to the anastomosis, series 2, image 62 and coronal series 5 image 42. Large stool in the rectum distal to this. Vascular/Lymphatic: Moderate aortic atherosclerosis. No aneurysm. No suspicious nodes Reproductive: Status post hysterectomy. No adnexal masses. Other: No free air. No pelvic effusion. Mild presacral soft tissue stranding. Mild nonspecific soft tissue stranding in left colic gutter. Interval repair of previously noted ventral hernias and parastomal hernias. Small periumbilical region hernia containing fat and small bowel. Musculoskeletal: Degenerative changes. No acute osseous abnormality. Post laminectomy changes L3 through S1. IMPRESSION: 1. Findings suspicious for proximal bowel obstruction with moderate fluid distension of the stomach, duodenum and proximal jejunum with suspected area of transition in the proximal jejunum right mid abdominal region where there is slightly swirled appearance of bowel and mesentery, either due to incomplete volvulus versus hernia versus adhesion. 2. Interval takedown of left abdominal  colostomy with reanastomosis at sigmoid colon. Possible caliber change/stricture at the rectosigmoid colon slightly distal to the anastomosis. There is large stool burden throughout the colon with marked rectal distension by feces. There is some perirectal edema and presacral soft tissue stranding. 3. Interval repair of ventral hernias and previously noted parastomal hernia. Small residual ventral hernia containing mesenteric and small bowel. 4. Stable left adrenal adenoma. Electronically Signed   By: Donavan Foil M.D.   On: 06/20/2021 22:43    EKG: I independently viewed the EKG done and my findings are as followed: Normal sinus rhythm at a rate of 73 bpm  Assessment/Plan Present on Admission:  Small bowel obstruction (HCC)  Elevated MCV  HIV disease ([place])  Essential hypertension  Principal Problem:   Small bowel obstruction (HCC) Active Problems:   HIV disease ([place])   Acute kidney injury superimposed on CKD ([place])   Essential hypertension   Nausea & vomiting   Elevated MCV   Abdominal pain   Hypercalcemia   Serum total bilirubin elevated   History of schizophrenia  Abdominal pain, nausea and vomiting secondary to small bowel obstruction Patient has not had any vomiting since yesterday, NG tube currently held; consider NG tube insertion for recurrence of vomiting or abdominal distention. Continue NPO at this time with plan to advance diet as tolerated Continue IV hydration Continue IV fentanyl 25 mcg q.2h p.r.n. for moderate/severe pain Continue Zofran p.r.n. for nausea/vomiting General surgery will be consulted and we shall await further recommendation.   Acute kidney injury on CKD stage 4/dehydration BUN alacrity and 42/1.80 (baseline creatinine at 1.2-1.4). Continue gentle hydration Renally adjust medications, avoid nephrotoxic agents/dehydration/hypotension  Hypercalcemia Ca 11.1 continue IV hydration Continue to monitor calcium level with morning labs  Elevated total  bilirubin Bilirubin 1.4, direct bilirubin will be checked  Elevated MCV (105.5) Vitamin B12 and folate levels will be checked  Essential hypertension (controlled) Continue home meds when patient resumes oral intake  History of HIV disease Patient follows with Dr. [name] Continue Triumeq when patient resumes oral intake   History of schizophrenia Patient has had had  visual and auditory hallucinations earlier this year and went to [facility] geripsych March 2022 per medical record  GERD Continue Protonix  DVT prophylaxis: SCDs (consider starting patient on chemoprophylaxis if no indication for surgical intervention)  Code Status: Full code  Family Communication: None at bedside  Disposition Plan:  Patient is from:                        home Anticipated DC to:                   SNF or family members home Anticipated DC date:               2-3 days Anticipated DC barriers:          Patient requires inpatient management due to small bowel obstruction and pending surgical consult  Consults called: General surgery  Admission status: Inpatient   Bernadette Hoit MD [facility]  06/21/2021, 12:32 AM

## 2021-06-20 NOTE — ED Provider Notes (Addendum)
11:15PM consult with Dr. Josephine Cables who accepts patient for admission  11:28 PM CONSULT with Dr. Luther Bradley with gen surg who will consult on pt   [name] 06/20/21 2318    Tanya Booze, PA-C 06/20/21 2329    Truddie Hidden, MD 06/21/21 [phone]

## 2021-06-20 NOTE — ED Provider Notes (Signed)
[facility] EMERGENCY DEPARTMENT Provider Note   CSN: [id number] Arrival date & time: 06/20/21  2021     History Chief Complaint  Patient presents with   Emesis    [name] is a 80 y.o. adult.  Past medical history includes hypertension, COPD, HIV, CKD stage III, peripheral neuropathy, history of TIAs, anxiety, depression.  Who presents today with chief complaint of vomiting.  Patient states that she has had 3 to 4 days of vomiting.  She says that she vomited 6 times today.  She says that she cannot keep any food down.  She has been able to drink some water but usually throws it back up.  She denies hematemesis.  States that the color of the vomit is dark..  She says that she has a mild headache. she does feel dizzy and lightheaded.  She states that her last bowel movement was today.  She denies melena.  She denies having any fevers, chest pain, shortness of breath, diarrhea.   Emesis Associated symptoms: abdominal pain and headaches   Associated symptoms: no chills, no cough, no diarrhea and no fever       Past Medical History:  Diagnosis Date   Anxiety    Arthritis    Bronchitis    Bronchitis    CKD (chronic kidney disease) stage 3, GFR 30-59 ml/min (HCC) 0/93/8182   Complication of anesthesia    pt states she had a cardiac arrest during hysterectomy 1985.  Pt had several surgies since then that were uneventful   COPD (chronic obstructive pulmonary disease) ([place])    Depression    Dysuria 03/27/2019   Elevated LFTs 05/04/2016   History of TIAs 1981   left side weakness   HIV (human immunodeficiency virus infection) ([place])    Hypertension    Kidney disease related to HIV infection [facility])    pt states she has to have her creatinine level checked often    Moderate single current episode of major depressive disorder ([place]) 03/12/2021   Nausea 03/03/2017   Peripheral neuropathy    bilateral feet   Seasonal allergies 03/27/2019   Sinusitis 05/06/2018   UTI (urinary tract  infection) 03/03/2017   Weight loss 03/03/2017    Patient Active Problem List   Diagnosis Date Noted   Generalized weakness 05/10/2021   Elevated MCV 05/10/2021   Moderate single current episode of major depressive disorder ([place]) 03/12/2021   Incontinence of urine in female 11/21/2020   Skin yeast infection 11/21/2020   Mass of left side of neck 09/12/2020   Colostomy in place [facility]) 09/12/2020   Housing problems 06/06/2020   Healthcare maintenance 06/06/2020   Abdominal wall bulge 04/22/2020   Acute renal failure superimposed on stage 3b chronic kidney disease ([place])    History of colonic polyps 07/06/2019   History of partial colectomy 07/06/2019   Seasonal allergies 03/27/2019   Dysuria 03/27/2019   Obesity (BMI 30-39.9) 11/12/2018   Constipation due to opioid therapy 11/12/2018   Lactic acidosis 11/12/2018   Large bowel perforation ([place]) 11/12/2018   Peritonitis ([place]) 11/12/2018   Stercoral ulcer of large intestine 11/12/2018   Pneumoperitoneum    COPD (chronic obstructive pulmonary disease) ([place]) 12/17/2017   Hypothyroidism 12/17/2017   Steroid-induced psychosis, with hallucinations ([place])    UTI (urinary tract infection) 03/03/2017   Nausea 03/03/2017   Weight loss 03/03/2017   CAP (community acquired pneumonia) 07/22/2016   CKD (chronic kidney disease) stage 3, GFR 30-59 ml/min (HCC) 05/04/2016   Elevated LFTs  05/04/2016   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   Breath shortness 06/19/2014   Syncope 05/31/2014   Rib fracture 05/31/2014   Weight gain 03/21/2014   Cervical spondylosis 08/15/2013   Neck pain on left side 05/24/2013   Unspecified vitamin D deficiency 12/14/2012   Vitamin D toxicity 12/14/2012   Hemorrhoid 09/01/2012   Myalgia 02/25/2012   Essential hypertension 11/25/2011   Cramps, muscle, general 08/18/2011   RECTAL BLEEDING 11/03/2010   Esophageal dysphagia 11/03/2010   FATIGUE 08/29/2010   MUSCLE PAIN 06/04/2010   Cystitis 05/12/2010   RENAL  INSUFFICIENCY 12/18/2008   DEPRESSION 09/04/2008   IRRITABLE BOWEL SYNDROME 05/22/2008   HERNIATED CERVICAL DISC 10/05/2007   HERNIATED LUMBAR DISC 10/05/2007   History of urinary tract infection 09/20/2007   HIV-1 associated autonomic neuropathy ([place]) 03/16/2007   ELBOW PAIN 03/16/2007   ISCHEMIC COLITIS 12/27/2006   RENAL FAILURE NOS 12/27/2006   HIV disease ([place]) 08/27/2006    Past Surgical History:  Procedure Laterality Date   ABDOMINAL HYSTERECTOMY  1985   BACK SURGERY     BIOPSY  07/15/2020   Procedure: BIOPSY;  Surgeon: Daneil Dolin, MD;  Location: [facility];  Service: Endoscopy;;   CATARACT EXTRACTION, BILATERAL     CHOLECYSTECTOMY     COLONOSCOPY  08/10/2002   NUR: Normal colonoscopy except for some hemorrhoids and some erythema at the dentate line   COLONOSCOPY  12/10/2006   Edwards:Diffuse colitis in the transverse and descending colon/This is not entirely typical of ischemic colitis or her HIV positive/ disease.  We need to be concerned about other causes   COLONOSCOPY  01/19/2011   RMR: Internal and external hemorrhoids, likely source of hematochezia anal papilla, otherwise normal rectal mucosa/ Left-sided diverticula.  Cecal polyp with status post cold snare polypectomy.  Remainder of colonic mucosa appeared normal. Pathology with tubular adenoma. Repeat in 2017   COLONOSCOPY WITH PROPOFOL N/A 07/15/2020   Procedure: COLONOSCOPY WITH PROPOFOL;  Surgeon: Daneil Dolin, MD;  Location: [facility];  Service: Endoscopy;  Laterality: N/A;  8:15AM TCS via Ostomy   COLOSTOMY N/A 11/12/2018   Procedure: COLOSTOMY;  Surgeon: Virl Cagey, MD;  Location: [facility] ORS;  Service: General;  Laterality: N/A;   COLOSTOMY REVERSAL N/A 10/02/2020   Procedure: COLOSTOMY REVERSAL;  Surgeon: Virl Cagey, MD;  Location: [facility] ORS;  Service: General;  Laterality: N/A;   ESOPHAGOGASTRODUODENOSCOPY  08/10/2002     NUR: Mild changes of reflux esophagitis limited to gastroesophageal  junction  No evidence of ring, stricture, or esophageal candidiasis dysphagia/ Gastritis, possibly H. pylori induced   ESOPHAGOGASTRODUODENOSCOPY  12/25/2002   NUR: Erosive antral gastritis.  The degree of gastritis is more significant than her last exam/ Normal examination of the esophagus/ Esophageal dilatation performed by passing 72 and [address] [place]   ESOPHAGOGASTRODUODENOSCOPY  11/13/2010   RMR: Circumferential distal esophageal erosions with soft peptic stricture, consistent with erosive reflux esophagitis or stricture  formation, status post dilation as described above/  Small hiatal hernia/ Tiny antral erosions, otherwise normal stomach D1 and D2   ESOPHAGOGASTRODUODENOSCOPY (EGD) WITH PROPOFOL N/A 07/15/2020   Procedure: ESOPHAGOGASTRODUODENOSCOPY (EGD) WITH PROPOFOL;  Surgeon: Daneil Dolin, MD;  [facility];  Service: Endoscopy;  Laterality: N/A;   FLEXIBLE SIGMOIDOSCOPY N/A 07/15/2020   Procedure: FLEXIBLE SIGMOIDOSCOPY;  Surgeon: Daneil Dolin, MD;  Location: [facility];  Service: Endoscopy;  Laterality: N/A;   HEMORRHOID SURGERY  09/02/2012   Procedure: HEMORRHOIDECTOMY;  Surgeon: Jamesetta So, MD;  Location: [facility] ORS;  Service: General;  Laterality: N/A;   LAPAROTOMY N/A 11/12/2018   Procedure: EXPLORATORY LAPAROTOMY;  Surgeon: Virl Cagey, MD;  Location: [facility] ORS;  Service: General;  Laterality: N/A;   MALONEY DILATION N/A 07/15/2020   Procedure: Venia Minks DILATION;  Surgeon: Daneil Dolin, MD;  Location: [facility];  Service: Endoscopy;  Laterality: N/A;   PARTIAL COLECTOMY N/A 11/12/2018   Procedure: PARTIAL COLECTOMY;  Surgeon: Virl Cagey, MD;  Location: [facility] ORS;  Service: General;  Laterality: N/A;   POLYPECTOMY  07/15/2020   Procedure: POLYPECTOMY;  Surgeon: Daneil Dolin, MD;  Location[facility];  Service: Endoscopy;;   TUBAL LIGATION       OB History     Gravida  4   Para  3   Term  2   Preterm  1   AB  1   Living  3       SAB  1   IAB      Ectopic      Multiple      Live Births  3           Family History  Problem Relation Age of Onset   Diabetes Mother    Diabetes Brother    Colon cancer Brother        Diagnosed > age 32    Social History   Tobacco Use   Smoking status: Never   Smokeless tobacco: Never  Vaping Use   Vaping Use: Never used  Substance Use Topics   Alcohol use: No   Drug use: No    Home Medications Prior to Admission medications   Medication Sig Start Date End Date Taking? Authorizing Provider  abacavir-dolutegravir-lamiVUDine (TRIUMEQ) 600-50-300 MG tablet Take 1 tablet by mouth daily. 03/12/21   Truman Hayward, MD  acetaminophen (TYLENOL) 500 MG tablet Take 2 tablets (1,000 mg total) by mouth every 6 (six) hours as needed for mild pain or moderate pain. 10/08/20   Virl Cagey, MD  albuterol (PROVENTIL HFA;VENTOLIN HFA) 108 (90 Base) MCG/ACT inhaler Inhale 1-2 puffs into the lungs every 6 (six) hours as needed for wheezing or shortness of breath. 04/02/17   Nat Christen, MD  ALLERGY RELIEF 10 MG tablet Take 10 mg by mouth daily. 05/02/21   [provider]  Cholecalciferol (VITAMIN D3) 10000 units TABS Take 1 tablet by mouth daily.    [provider]  clonazePAM (KLONOPIN) 1 MG tablet Take 1 tablet (1 mg total) by mouth 2 (two) times daily as needed for anxiety. 05/12/21   Dessa Phi, DO  diphenhydrAMINE (BENADRYL) 25 mg capsule Take 25 mg by mouth daily as needed for itching (itching from codeine).     [provider]  docusate sodium (COLACE) 100 MG capsule Take 1 capsule (100 mg total) by mouth 2 (two) times daily. 11/21/18   Virl Cagey, MD  DULoxetine (CYMBALTA) 60 MG capsule Take 60 mg by mouth daily. 02/28/21   [provider]  fluticasone (FLONASE) 50 MCG/ACT nasal spray 2 SPRAYS INTO BOTH NOSTRILS AT BEDTIME. 05/22/19   [name], Lavell Islam, MD  furosemide (LASIX) 40 MG tablet Take 1 tablet (40 mg total) by  mouth daily as needed for fluid. Hold until follow up with PCP 12/31/19   Barton Dubois, MD  haloperidol (HALDOL) 10 MG tablet Take 10 mg by mouth at bedtime. 04/23/21   [provider]  ipratropium-albuterol (DUONEB) 0.5-2.5 (3) MG/3ML SOLN Inhale 3  mLs into the lungs every 6 (six) hours as needed (shortness of breath).  04/02/17   [provider]  labetalol (NORMODYNE) 100 MG tablet Take 1 tablet (100 mg total) by mouth 2 (two) times daily. 05/17/21 06/16/21  ShahmehdiValeria Batman, MD  losartan (COZAAR) 25 MG tablet Take 12.5 mg by mouth daily. 05/02/21   [provider]  magnesium 30 MG tablet Take 30 mg by mouth at bedtime.     [provider]  Multiple Vitamin (ONE-A-DAY 55 PLUS PO) Take 1 tablet by mouth daily.    [provider]  nystatin (MYCOSTATIN/NYSTOP) powder Apply 1 application topically 3 (three) times daily. Apply to groin region until redness and irritation improving 10/15/20   Virl Cagey, MD  Omega-3 Fatty Acids (FISH OIL BURP-LESS) 1000 MG CAPS Take 1 capsule by mouth daily.    [provider]  ondansetron (ZOFRAN) 8 MG tablet Take 8 mg by mouth daily as needed for nausea or vomiting.    [provider]  pantoprazole (PROTONIX) 40 MG tablet Take 1 tablet (40 mg total) by mouth daily before breakfast. 07/16/20 07/16/21  Erenest Rasher, PA-C  polyvinyl alcohol (LIQUIFILM TEARS) 1.4 % ophthalmic solution Place 1 drop into both eyes daily.    [provider]  psyllium (HYDROCIL/METAMUCIL) 95 % PACK Take 1 packet by mouth daily. 10/09/20   Virl Cagey, MD  thiamine (VITAMIN B-1) 100 MG tablet Take 100 mg by mouth daily.    [provider]  XTAMPZA ER 13.5 MG C12A Take 1 capsule by mouth 2 (two) times daily. 05/12/21   Dessa Phi, DO    Allergies    Bee venom, Promethazine, Tenofovir disoproxil fumarate [tenofovir disoproxil fumarate], Compazine  [prochlorperazine edisylate], Haloperidol lactate,  Sulfa antibiotics, Brompheniramine-acetaminophen, Chlorcyclizine, Chlorpromazine, Codeine, Navane [thiothixene], Prednisone, Promethazine hcl, Propoxyphene n-acetaminophen, and Morphine  Review of Systems   Review of Systems  Constitutional:  Positive for appetite change. Negative for chills, diaphoresis and fever.  Eyes:  Negative for visual disturbance.  Respiratory:  Negative for cough, chest tightness, shortness of breath and wheezing.   Cardiovascular:  Negative for chest pain, palpitations and leg swelling.  Gastrointestinal:  Positive for abdominal pain, nausea and vomiting. Negative for abdominal distention, blood in stool, constipation and diarrhea.  Genitourinary:  Negative for decreased urine volume, difficulty urinating, dysuria, flank pain and hematuria.  Neurological:  Positive for dizziness, light-headedness and headaches. Negative for syncope and weakness.  All other systems reviewed and are negative.  Physical Exam Updated Vital Signs BP 121/60   Pulse 71   Temp 98.8 F (37.1 C)   Resp 20   Ht 5\' 2"  (1.575 m)   Wt 65.3 kg   SpO2 96%   BMI 26.34 kg/m   Physical Exam Vitals and nursing note reviewed.  Constitutional:      General: She is not in acute distress.    Appearance: Normal appearance. She is obese. She is not ill-appearing, toxic-appearing or diaphoretic.  HENT:     Head: Normocephalic and atraumatic.     Mouth/Throat:     Mouth: Mucous membranes are dry.     Pharynx: Oropharynx is clear. No oropharyngeal exudate or posterior oropharyngeal erythema.  Eyes:     General: No scleral icterus.       Right eye: No discharge.        Left eye: No discharge.     Conjunctiva/sclera: Conjunctivae normal.     Pupils: Pupils are equal, round, and reactive  to light.  Cardiovascular:     Rate and Rhythm: Normal rate and regular rhythm.     Pulses: Normal pulses.     Heart sounds: Normal heart sounds, S1 normal and S2 normal. No murmur heard.   No friction rub.  No gallop.  Pulmonary:     Effort: Pulmonary effort is normal. No respiratory distress.     Breath sounds: Normal breath sounds. No wheezing, rhonchi or rales.  Abdominal:     General: Abdomen is flat. Bowel sounds are normal. There is no distension.     Palpations: Abdomen is soft. There is no pulsatile mass.     Tenderness: There is abdominal tenderness in the right upper quadrant and epigastric area. There is no guarding or rebound.  Musculoskeletal:     Right lower leg: No edema.     Left lower leg: No edema.  Skin:    General: Skin is warm and dry.     Coloration: Skin is not jaundiced.     Findings: No bruising, erythema, lesion or rash.  Neurological:     General: No focal deficit present.     Mental Status: She is alert and oriented to person, place, and time. Mental status is at baseline.  Psychiatric:        Mood and Affect: Mood normal.        Behavior: Behavior normal.    ED Results / Procedures / Treatments   Labs (all labs ordered are listed, but only abnormal results are displayed) Labs Reviewed  CBC WITH DIFFERENTIAL/PLATELET - Abnormal; Notable for the following components:      Result Value   RBC 3.84 (*)    MCV 105.5 (*)    MCH 36.7 (*)    Neutro Abs 8.0 (*)    All other components within normal limits  COMPREHENSIVE METABOLIC PANEL - Abnormal; Notable for the following components:   Sodium 134 (*)    Chloride 91 (*)    CO2 34 (*)    Glucose, Bld 130 (*)    BUN 42 (*)    Creatinine, Ser 1.80 (*)    Calcium 11.1 (*)    Total Bilirubin 1.4 (*)    GFR, Estimated 28 (*)    All other components within normal limits  URINALYSIS, ROUTINE W REFLEX MICROSCOPIC - Abnormal; Notable for the following components:   APPearance CLOUDY (*)    Protein, ur 100 (*)    Leukocytes,Ua LARGE (*)    Bacteria, UA FEW (*)    All other components within normal limits  RESP PANEL BY RT-PCR (FLU A&B, COVID) ARPGX2  LIPASE, BLOOD    EKG EKG  Interpretation  Date/Time:  Friday June 20 2021 20:36:46 EDT Ventricular Rate:  73 PR Interval:  162 QRS Duration: 122 QT Interval:  406 QTC Calculation: 448 R Axis:   38 Text Interpretation: Sinus rhythm Interpretation limited secondary to artifact Nonspecific intraventricular conduction delay Baseline wander in lead(s) V2 No significant change since last tracing Confirmed by Calvert Cantor [phone]) on 06/20/2021 9:14:04 PM  Radiology CT ABDOMEN PELVIS WO CONTRAST  Result Date: 06/20/2021 CLINICAL DATA:  Abdomen pain vomiting EXAM: CT ABDOMEN AND PELVIS WITHOUT CONTRAST TECHNIQUE: Multidetector CT imaging of the abdomen and pelvis was performed following the standard protocol without IV contrast. COMPARISON:  CT 05/28/2020, 11/12/2018 FINDINGS: Lower chest: Lung bases demonstrate no acute consolidation or effusion. Atelectasis or scarring at the bases. Coronary vascular calcification. Hepatobiliary: No focal liver abnormality is seen. Status post cholecystectomy. No biliary  dilatation. Pancreas: Unremarkable. No pancreatic ductal dilatation or surrounding inflammatory changes. Spleen: Normal in size without focal abnormality. Adrenals/Urinary Tract: Right adrenal gland is normal. 19 mm left adrenal nodule, stable consistent with adenoma. Kidneys show no hydronephrosis. The bladder is normal Stomach/Bowel: Moderate fluid distension of the stomach. Dilated fluid-filled duodenum and proximal jejunum. Slightly swirled appearance of mesentery and bowel within the right mid abdomen, series 2, image 53 with relatively decompressed remainder of the distal small bowel. No acute bowel wall thickening. Large volume of stool in the colon and rectum. Interval takedown of left abdominal colostomy with reanastomosis at the sigmoid colon. Possible mild: Narrowing slightly distal to the anastomosis, series 2, image 62 and coronal series 5 image 42. Large stool in the rectum distal to this. Vascular/Lymphatic:  Moderate aortic atherosclerosis. No aneurysm. No suspicious nodes Reproductive: Status post hysterectomy. No adnexal masses. Other: No free air. No pelvic effusion. Mild presacral soft tissue stranding. Mild nonspecific soft tissue stranding in left colic gutter. Interval repair of previously noted ventral hernias and parastomal hernias. Small periumbilical region hernia containing fat and small bowel. Musculoskeletal: Degenerative changes. No acute osseous abnormality. Post laminectomy changes L3 through S1. IMPRESSION: 1. Findings suspicious for proximal bowel obstruction with moderate fluid distension of the stomach, duodenum and proximal jejunum with suspected area of transition in the proximal jejunum right mid abdominal region where there is slightly swirled appearance of bowel and mesentery, either due to incomplete volvulus versus hernia versus adhesion. 2. Interval takedown of left abdominal colostomy with reanastomosis at sigmoid colon. Possible caliber change/stricture at the rectosigmoid colon slightly distal to the anastomosis. There is large stool burden throughout the colon with marked rectal distension by feces. There is some perirectal edema and presacral soft tissue stranding. 3. Interval repair of ventral hernias and previously noted parastomal hernia. Small residual ventral hernia containing mesenteric and small bowel. 4. Stable left adrenal adenoma. Electronically Signed   By: Donavan Foil M.D.   On: 06/20/2021 22:43    Procedures Procedures   Medications Ordered in ED Medications  ondansetron (ZOFRAN) injection 4 mg (4 mg Intravenous Given 06/20/21 2111)    ED Course  I have reviewed the triage vital signs and the nursing notes.  Pertinent labs & imaging results that were available during my care of the patient were reviewed by me and considered in my medical decision making (see chart for details).  Clinical Course as of 06/20/21 2303  Fri Jun 20, 2021  2214 Total  Bilirubin(!): 1.4 [GL]  2214 Creatinine(!): 1.80 [GL]  2214 Calcium(!): 11.1 [GL]  2214 BUN(!): 42 [GL]    Clinical Course User Index [GL] Lavilla Delamora, Adora Fridge, PA-C   MDM Rules/Calculators/A&P                         Is an 80 year old female who presents with a chief complaint of vomiting for 3 to 4 days.  Vitals on presentation are stable.  Exam notable for dry mucous membranes..  Epigastric and right upper quadrant abdominal tenderness present on exam but no guarding or rigidity.  Belly is soft.  Bowel sounds present.  Reviewed prior imaging performed at patient's nursing facility.  KUB showed distended abdomen with gastric ileus or partial gastric outlet obstruction.  Plan to order CT abdomen pelvis.  Patient with elevated T bili on labs today.  Also with mild AKI.  Lipase is normal.  Liver enzymes are normal.  CT abdomen pelvis obtained.  Findings suspicious for  proximal bowel obstruction with moderate fluid distention of the stomach, duodenum, and proximal jejunum.  General surgery consulted. Awaiting their return call.  11:03 PM Care of .name transferred to Dr. Sedonia Small at the end of my shift as the patient will require reassessment once labs/imaging have resulted. Patient presentation, ED course, and plan of care discussed with review of all pertinent labs and imaging. Please see his/her note for further details regarding further ED course and disposition. Plan at time of handoff is to admit patient for small bowel obstruction. Consult to general surgery placed. Awaiting their return call. This may be altered or completely changed at the discretion of the oncoming team pending results of further workup.  Final Clinical Impression(s) / ED Diagnoses Final diagnoses:  Small bowel obstruction ([place])  Intractable nausea and vomiting    Rx / DC Orders ED Discharge Orders     None        Sheila Oats 06/20/21 2303    Truddie Hidden, MD 06/20/21 [phone]

## 2021-06-20 NOTE — ED Notes (Signed)
Brandi RN from [facility] called ED to check on Pts status. Updated that Pt will be admitted at this time.

## 2021-06-20 NOTE — ED Triage Notes (Signed)
Pt bib ems from [place] for vomiting 2-3 days.  Vss by ems.  Bgl 134. Hx of cva.  Pt si alert and oriented.  Resp even and unlabored.  Skin warm and dry.

## 2021-06-20 NOTE — ED Notes (Signed)
Patient transported to CT 

## 2021-06-20 NOTE — ED Notes (Signed)
Patient on 12 lead. EKG done

## 2021-06-20 NOTE — ED Notes (Signed)
ED Provider at bedside. 

## 2021-06-21 ENCOUNTER — Inpatient Hospital Stay (HOSPITAL_COMMUNITY): Payer: Medicare Other

## 2021-06-21 DIAGNOSIS — Z8659 Personal history of other mental and behavioral disorders: Secondary | ICD-10-CM

## 2021-06-21 DIAGNOSIS — R17 Unspecified jaundice: Secondary | ICD-10-CM | POA: Diagnosis present

## 2021-06-21 DIAGNOSIS — R109 Unspecified abdominal pain: Secondary | ICD-10-CM | POA: Diagnosis present

## 2021-06-21 DIAGNOSIS — K56609 Unspecified intestinal obstruction, unspecified as to partial versus complete obstruction: Secondary | ICD-10-CM | POA: Diagnosis present

## 2021-06-21 LAB — APTT: aPTT: 28 seconds (ref 24–36)

## 2021-06-21 LAB — PHOSPHORUS: Phosphorus: 4.1 mg/dL (ref 2.5–4.6)

## 2021-06-21 LAB — COMPREHENSIVE METABOLIC PANEL
ALT: 20 U/L (ref 0–44)
AST: 22 U/L (ref 15–41)
Albumin: 3.9 g/dL (ref 3.5–5.0)
Alkaline Phosphatase: 71 U/L (ref 38–126)
Anion gap: 13 (ref 5–15)
BUN: 44 mg/dL — ABNORMAL HIGH (ref 8–23)
CO2: 29 mmol/L (ref 22–32)
Calcium: 11 mg/dL — ABNORMAL HIGH (ref 8.9–10.3)
Chloride: 97 mmol/L — ABNORMAL LOW (ref 98–111)
Creatinine, Ser: 1.72 mg/dL — ABNORMAL HIGH (ref 0.44–1.00)
GFR, Estimated: 30 mL/min — ABNORMAL LOW (ref >60)
Glucose, Bld: 108 mg/dL — ABNORMAL HIGH (ref 70–99)
Potassium: 3.9 mmol/L (ref 3.5–5.1)
Sodium: 139 mmol/L (ref 135–145)
Total Bilirubin: 1.3 mg/dL — ABNORMAL HIGH (ref 0.3–1.2)
Total Protein: 7.1 g/dL (ref 6.5–8.1)

## 2021-06-21 LAB — CBC
HCT: 38.6 % (ref 36.0–46.0)
Hemoglobin: 13.2 g/dL (ref 12.0–15.0)
MCH: 36.4 pg — ABNORMAL HIGH (ref 26.0–34.0)
MCHC: 34.2 g/dL (ref 30.0–36.0)
MCV: 106.3 fL — ABNORMAL HIGH (ref 80.0–100.0)
Platelets: 188 10*3/uL (ref 150–400)
RBC: 3.63 MIL/uL — ABNORMAL LOW (ref 3.87–5.11)
RDW: 12.4 % (ref 11.5–15.5)
WBC: 9.3 10*3/uL (ref 4.0–10.5)
nRBC: 0 % (ref 0.0–0.2)

## 2021-06-21 LAB — VITAMIN B12: Vitamin B-12: 634 pg/mL (ref 180–914)

## 2021-06-21 LAB — MAGNESIUM: Magnesium: 2.4 mg/dL (ref 1.7–2.4)

## 2021-06-21 LAB — BILIRUBIN, DIRECT: Bilirubin, Direct: 0.2 mg/dL (ref 0.0–0.2)

## 2021-06-21 LAB — MRSA NEXT GEN BY PCR, NASAL: MRSA by PCR Next Gen: NOT DETECTED

## 2021-06-21 LAB — GLUCOSE, CAPILLARY
Glucose-Capillary: 108 mg/dL — ABNORMAL HIGH (ref 70–99)
Glucose-Capillary: 96 mg/dL (ref 70–99)

## 2021-06-21 LAB — PROTIME-INR
INR: 1.1 (ref 0.8–1.2)
Prothrombin Time: 14.4 seconds (ref 11.4–15.2)

## 2021-06-21 LAB — FOLATE: Folate: 29.2 ng/mL (ref >5.9)

## 2021-06-21 MED ORDER — ONDANSETRON HCL 4 MG/2ML IJ SOLN: 4.0000 mg | Freq: Four times a day (QID) | INTRAMUSCULAR | Status: DC | PRN

## 2021-06-21 MED ORDER — SODIUM CHLORIDE 0.9 % IV SOLN
1.0000 g | INTRAVENOUS | Status: DC
  Administered 2021-06-21 – 2021-06-24 (×4): 1 g via INTRAVENOUS
  Filled 2021-06-21 (×4): qty 10

## 2021-06-21 MED ORDER — SODIUM CHLORIDE 0.9 % IV SOLN: INTRAVENOUS | Status: DC

## 2021-06-21 MED ORDER — HEPARIN SODIUM (PORCINE) 5000 UNIT/ML IJ SOLN
5000.0000 [IU] | Freq: Three times a day (TID) | INTRAMUSCULAR | Status: DC
  Administered 2021-06-21 – 2021-06-26 (×14): 5000 [IU] via SUBCUTANEOUS
  Filled 2021-06-21 (×14): qty 1

## 2021-06-21 MED ORDER — PANTOPRAZOLE SODIUM 40 MG IV SOLR
40.0000 mg | INTRAVENOUS | Status: DC
  Administered 2021-06-21 – 2021-06-26 (×6): 40 mg via INTRAVENOUS
  Filled 2021-06-21 (×6): qty 40

## 2021-06-21 MED ORDER — FENTANYL CITRATE PF 50 MCG/ML IJ SOSY: 25.0000 ug | PREFILLED_SYRINGE | INTRAMUSCULAR | Status: DC | PRN

## 2021-06-21 MED ORDER — SODIUM CHLORIDE 0.9 % IV SOLN: Freq: Once | INTRAVENOUS | Status: AC

## 2021-06-21 MED ORDER — BISACODYL 10 MG RE SUPP
10.0000 mg | Freq: Every day | RECTAL | Status: AC
  Administered 2021-06-21 – 2021-06-23 (×3): 10 mg via RECTAL
  Filled 2021-06-21 (×3): qty 1

## 2021-06-21 MED ORDER — FLEET ENEMA 7-19 GM/118ML RE ENEM: 1.0000 | ENEMA | Freq: Every day | RECTAL | Status: DC | PRN

## 2021-06-21 MED ORDER — LABETALOL HCL 5 MG/ML IV SOLN: 10.0000 mg | INTRAVENOUS | Status: DC | PRN

## 2021-06-21 NOTE — Consult Note (Signed)
[facility] SURGICAL CONSULTATION NOTE (initial) - cpt: 24097    HISTORY OF PRESENT ILLNESS (HPI):  80 y.o. adult presented to [facility] ED yesterday for evaluation of small bowel obstruction as identified on CT scan. Patient reports a 4-day history of bilious emesis.  She reports vomiting a half a dozen times daily.  She has been able to tolerate eating for a week.  She had a normal bowel movement yesterday, but denies any significant flatus today.  Prior surgeries include a colostomy with colostomy reversal due to colonic perforation per patient's report.  Surgery is consulted by hospitalist physician Dr. Denton Brick in this context for evaluation and management of small bowel obstruction, likely secondary to adhesions. She reports that since placement of NG tube she has had no further nausea or vomiting.  And she endorses feeling much improved without abdominal pain despite a lack of flatus.  PAST MEDICAL HISTORY (PMH):  Past Medical History:  Diagnosis Date   Anxiety    Arthritis    Bronchitis    Bronchitis    CKD (chronic kidney disease) stage 3, GFR 30-59 ml/min ([place]) 3/53/2992   Complication of anesthesia    pt states she had a cardiac arrest during hysterectomy 1985.  Pt had several surgies since then that were uneventful   COPD (chronic obstructive pulmonary disease) ([place])    Depression    Dysuria 03/27/2019   Elevated LFTs 05/04/2016   History of TIAs 1981   left side weakness   HIV (human immunodeficiency virus infection) ([place])    Hypertension    Kidney disease related to HIV infection [facility])    pt states she has to have her creatinine level checked often    Moderate single current episode of major depressive disorder ([place]) 03/12/2021   Nausea 03/03/2017   Peripheral neuropathy    bilateral feet   Seasonal allergies 03/27/2019   Sinusitis 05/06/2018   UTI (urinary tract infection) 03/03/2017   Weight loss 03/03/2017     PAST SURGICAL HISTORY ([place]):  Past Surgical  History:  Procedure Laterality Date   ABDOMINAL HYSTERECTOMY  1985   BACK SURGERY     BIOPSY  07/15/2020   Procedure: BIOPSY;  Surgeon: Daneil Dolin, MD;  Location: [facility];  Service: Endoscopy;;   CATARACT EXTRACTION, BILATERAL     CHOLECYSTECTOMY     COLONOSCOPY  08/10/2002   NUR: Normal colonoscopy except for some hemorrhoids and some erythema at the dentate line   COLONOSCOPY  12/10/2006   Ed[place]:Diffuse colitis in the transverse and descending colon/This is not entirely typical of ischemic colitis or her HIV positive/ disease.  We need to be concerned about other causes   COLONOSCOPY  01/19/2011   RMR: Internal and external hemorrhoids, likely source of hematochezia anal papilla, otherwise normal rectal mucosa/ Left-sided diverticula.  Cecal polyp with status post cold snare polypectomy.  Remainder of colonic mucosa appeared normal. Pathology with tubular adenoma. Repeat in 2017   COLONOSCOPY WITH PROPOFOL N/A 07/15/2020   Procedure: COLONOSCOPY WITH PROPOFOL;  Surgeon: Daneil Dolin, MD;  Location: [facility];  Service: Endoscopy;  Laterality: N/A;  8:15AM TCS via Ostomy   COLOSTOMY N/A 11/12/2018   Procedure: COLOSTOMY;  Surgeon: Virl Cagey, MD;  Location: [facility] ORS;  Service: General;  Laterality: N/A;   COLOSTOMY REVERSAL N/A 10/02/2020   Procedure: COLOSTOMY REVERSAL;  Surgeon: Virl Cagey, MD;  Location: [facility] ORS;  Service: General;  Laterality: N/A;   ESOPHAGOGASTRODUODENOSCOPY  08/10/2002  NUR: Mild changes of reflux esophagitis limited to gastroesophageal junction  No evidence of ring, stricture, or esophageal candidiasis dysphagia/ Gastritis, possibly H. pylori induced   ESOPHAGOGASTRODUODENOSCOPY  12/25/2002   NUR: Erosive antral gastritis.  The degree of gastritis is more significant than her last exam/ Normal examination of the esophagus/ Esophageal dilatation performed by passing 52 and 30 [place]   ESOPHAGOGASTRODUODENOSCOPY  11/13/2010    RMR: Circumferential distal esophageal erosions with soft peptic stricture, consistent with erosive reflux esophagitis or stricture  formation, status post dilation as described above/  Small hiatal hernia/ Tiny antral erosions, otherwise normal stomach D1 and D2   ESOPHAGOGASTRODUODENOSCOPY (EGD) WITH PROPOFOL N/A 07/15/2020   Procedure: ESOPHAGOGASTRODUODENOSCOPY (EGD) WITH PROPOFOL;  Surgeon: Daneil Dolin, MD;  Location[facility];  Service: Endoscopy;  Laterality: N/A;   FLEXIBLE SIGMOIDOSCOPY N/A 07/15/2020   Procedure: FLEXIBLE SIGMOIDOSCOPY;  Surgeon: Daneil Dolin, MD;  Location: [facility];  Service: Endoscopy;  Laterality: N/A;   HEMORRHOID SURGERY  09/02/2012   Procedure: HEMORRHOIDECTOMY;  Surgeon: Jamesetta So, MD;  Location: [facility] ORS;  Service: General;  Laterality: N/A;   LAPAROTOMY N/A 11/12/2018   Procedure: EXPLORATORY LAPAROTOMY;  Surgeon: Virl Cagey, MD;  Location: [facility] ORS;  Service: General;  Laterality: N/A;   MALONEY DILATION N/A 07/15/2020   Procedure: Keturah Shavers;  Surgeon: Daneil Dolin, MD;  Location: [facility];  Service: Endoscopy;  Laterality: N/A;   PARTIAL COLECTOMY N/A 11/12/2018   Procedure: PARTIAL COLECTOMY;  Surgeon: Virl Cagey, MD;  Location: [facility] ORS;  Service: General;  Laterality: N/A;   POLYPECTOMY  07/15/2020   Procedure: POLYPECTOMY;  Surgeon: Daneil Dolin, MD;  [facility];  Service: Endoscopy;;   TUBAL LIGATION       MEDICATIONS:  Prior to Admission medications   Medication Sig Start Date End Date Taking? Authorizing Provider  abacavir-dolutegravir-lamiVUDine (TRIUMEQ) 600-50-300 MG tablet Take 1 tablet by mouth daily. 03/12/21   Truman Hayward, MD  acetaminophen (TYLENOL) 500 MG tablet Take 2 tablets (1,000 mg total) by mouth every 6 (six) hours as needed for mild pain or moderate pain. 10/08/20   Virl Cagey, MD  albuterol (PROVENTIL HFA;VENTOLIN HFA) 108 (90 Base) MCG/ACT inhaler Inhale 1-2  puffs into the lungs every 6 (six) hours as needed for wheezing or shortness of breath. 04/02/17   Nat Christen, MD  ALLERGY RELIEF 10 MG tablet Take 10 mg by mouth daily. 05/02/21   [provider]  Cholecalciferol (VITAMIN D3) 10000 units TABS Take 1 tablet by mouth daily.    [provider]  clonazePAM (KLONOPIN) 1 MG tablet Take 1 tablet (1 mg total) by mouth 2 (two) times daily as needed for anxiety. 05/12/21   Dessa Phi, DO  diphenhydrAMINE (BENADRYL) 25 mg capsule Take 25 mg by mouth daily as needed for itching (itching from codeine).     [provider]  docusate sodium (COLACE) 100 MG capsule Take 1 capsule (100 mg total) by mouth 2 (two) times daily. 11/21/18   Virl Cagey, MD  DULoxetine (CYMBALTA) 60 MG capsule Take 60 mg by mouth daily. 02/28/21   [provider]  fluticasone (FLONASE) 50 MCG/ACT nasal spray 2 SPRAYS INTO BOTH NOSTRILS AT BEDTIME. 05/22/19   [name], Lavell Islam, MD  furosemide (LASIX) 40 MG tablet Take 1 tablet (40 mg total) by mouth daily as needed for fluid. Hold until follow up with PCP 12/31/19   Barton Dubois,  MD  haloperidol (HALDOL) 10 MG tablet Take 10 mg by mouth at bedtime. 04/23/21   [provider]  ipratropium-albuterol (DUONEB) 0.5-2.5 (3) MG/3ML SOLN Inhale 3 mLs into the lungs every 6 (six) hours as needed (shortness of breath).  04/02/17   [provider]  labetalol (NORMODYNE) 100 MG tablet Take 1 tablet (100 mg total) by mouth 2 (two) times daily. 05/17/21 06/16/21  ShahmehdiValeria Batman, MD  losartan (COZAAR) 25 MG tablet Take 12.5 mg by mouth daily. 05/02/21   [provider]  magnesium 30 MG tablet Take 30 mg by mouth at bedtime.     [provider]  Multiple Vitamin (ONE-A-DAY 55 PLUS PO) Take 1 tablet by mouth daily.    [provider]  nystatin (MYCOSTATIN/NYSTOP) powder Apply 1 application topically 3 (three) times daily. Apply to groin region until redness and irritation  improving 10/15/20   Virl Cagey, MD  Omega-3 Fatty Acids (FISH OIL BURP-LESS) 1000 MG CAPS Take 1 capsule by mouth daily.    [provider]  ondansetron (ZOFRAN) 8 MG tablet Take 8 mg by mouth daily as needed for nausea or vomiting.    [provider]  pantoprazole (PROTONIX) 40 MG tablet Take 1 tablet (40 mg total) by mouth daily before breakfast. 07/16/20 07/16/21  Erenest Rasher, PA-C  polyvinyl alcohol (LIQUIFILM TEARS) 1.4 % ophthalmic solution Place 1 drop into both eyes daily.    [provider]  psyllium (HYDROCIL/METAMUCIL) 95 % PACK Take 1 packet by mouth daily. 10/09/20   Virl Cagey, MD  thiamine (VITAMIN B-1) 100 MG tablet Take 100 mg by mouth daily.    [provider]  XTAMPZA ER 13.5 MG C12A Take 1 capsule by mouth 2 (two) times daily. 05/12/21   Dessa Phi, DO     ALLERGIES:  Allergies  Allergen Reactions   Bee Venom Anaphylaxis   Promethazine Swelling   Tenofovir Disoproxil Fumarate [Tenofovir Disoproxil Fumarate] Other (See Comments)    Renal failure, (08/27/2006)   Compazine  [Prochlorperazine Edisylate] Nausea And Vomiting   Haloperidol Lactate Other (See Comments)    Reaction is muscle tension, causes severe spasms in face and neck. Forces eyes to roll in the back of the head   Sulfa Antibiotics Diarrhea   Brompheniramine-Acetaminophen    Chlorcyclizine    Chlorpromazine    Codeine Itching and Nausea And Vomiting   Navane [Thiothixene] Other (See Comments) and Nausea And Vomiting    Same muscle spasm reaction as haldol   Prednisone     Brief mild psychosis and agitation   Promethazine Hcl Other (See Comments)   Propoxyphene N-Acetaminophen Itching and Nausea And Vomiting   Morphine Itching and Swelling     SOCIAL HISTORY:  Social History   Socioeconomic History   Marital status: Widowed    Spouse name: Not on file   Number of children: 3   Years of education: Not on file   Highest education level:  Not on file  Occupational History   Occupation: retired    Fish farm manager: RETIRED  Tobacco Use   Smoking status: Never   Smokeless tobacco: Never  Vaping Use   Vaping Use: Never used  Substance and Sexual Activity   Alcohol use: No   Drug use: No   Sexual activity: Not Currently    Comment: declined condoms  Other Topics Concern   Not on file  Social History Narrative   Not on file   Social Determinants of Health  Financial Resource Strain: Not on file  Food Insecurity: Not on file  Transportation Needs: Not on file  Physical Activity: Not on file  Stress: Not on file  Social Connections: Not on file  Intimate Partner Violence: Not on file     FAMILY HISTORY:  Family History  Problem Relation Age of Onset   Diabetes Mother    Diabetes Brother    Colon cancer Brother        Diagnosed > age 51      REVIEW OF SYSTEMS:  Constitutional: Negative for chills and fever.  HENT: Negative for ear pain and sore throat.   Eyes: Negative for pain and visual disturbance.  Respiratory: Negative for cough, chest tightness and shortness of breath.   Cardiovascular: Negative for chest pain and palpitations.  Gastrointestinal: Positive for abdominal pain, nausea and vomiting.  Endocrine: Negative for polyphagia and polyuria.  Genitourinary: Negative for decreased urine volume, dysuria, enuresis Musculoskeletal: Negative for arthralgias and back pain.  Skin: Negative for color change and rash.  Allergic/Immunologic: Negative for immunocompromised state.  Neurological: Positive for lightheadedness and dizziness.  Negative for tremors, syncope, speech difficulty Hematological: Does not bruise/bleed easily.   VITAL SIGNS:  Temp:  [97.9 F (36.6 C)-98.8 F (37.1 C)] 97.9 F (36.6 C) (08/27 0558) Pulse Rate:  [66-76] 66 (08/27 0558) Resp:  [16-20] 18 (08/27 0558) BP: (101-136)/(60-69) 123/62 (08/27 0558) SpO2:  [95 %-97 %] 97 % (08/27 0558) Weight:  [65.3 kg-72.3 kg] 72.3 kg (08/27  0108)     Height: 5\' 2"  (157.5 cm) Weight: 72.3 kg BMI (Calculated): 29.15   INTAKE/OUTPUT:  No intake/output data recorded.  PHYSICAL EXAM:  Physical Exam Blood pressure 123/62, pulse 66, temperature 97.9 F (36.6 C), temperature source Oral, resp. rate 18, height 5\' 2"  (1.575 m), weight 72.3 kg, SpO2 97 %. Last Weight  Most recent update: 06/21/2021  1:09 AM    Weight  72.3 kg (159 lb 6.3 oz)            NG in place with bilious/dark fluid output.  CONSTITUTIONAL: Well developed, and nourished, appropriately responsive and aware without distress.   EYES: Sclera non-icteric.   EARS, NOSE, MOUTH AND THROAT: Nasogastric tube in place.  Oral mucosa is pink and moist.    Hearing is intact to voice.  NECK: Trachea is midline, and there is no jugular venous distension.  LYMPH NODES:  Lymph nodes in the neck are not enlarged. RESPIRATORY:  Lungs are clear, and breath sounds are equal. Normal respiratory effort without pathologic use of accessory muscles. CARDIOVASCULAR: Heart is regular in rate and rhythm. GI: The abdomen is scarred centrally, soft, nontender, and without appreciable distention. There were no palpable masses. There were bowel sounds. MUSCULOSKELETAL:  Symmetrical muscle tone appreciated in all four extremities.    SKIN: Skin turgor is normal. No pathologic skin lesions appreciated.  NEUROLOGIC:  Motor and sensation appear grossly normal.  Cranial nerves are grossly without defect. PSYCH:  Alert and oriented to person, place and time. Affect is appropriate for situation.  Data Reviewed I have personally reviewed what is currently available of the patient's imaging, recent labs and medical records.    Labs:  CBC Latest Ref Rng & Units 06/21/2021 06/20/2021 05/11/2021  WBC 4.0 - 10.5 K/uL 9.3 10.5 6.6  Hemoglobin 12.0 - 15.0 g/dL 13.2 14.1 15.5(H)  Hematocrit 36.0 - 46.0 % 38.6 40.5 45.4  Platelets 150 - 400 K/uL 188 218 150   CMP Latest Ref Rng & Units  06/21/2021  06/20/2021 05/11/2021  Glucose 70 - 99 mg/dL 108(H) 130(H) 96  BUN 8 - 23 mg/dL 44(H) 42(H) 19  Creatinine 0.44 - 1.00 mg/dL 1.72(H) 1.80(H) 1.15(H)  Sodium 135 - 145 mmol/L 139 134(L) 140  Potassium 3.5 - 5.1 mmol/L 3.9 4.7 4.4  Chloride 98 - 111 mmol/L 97(L) 91(L) 106  CO2 22 - 32 mmol/L 29 34(H) 26  Calcium 8.9 - 10.3 mg/dL 11.0(H) 11.1(H) 10.5(H)  Total Protein 6.5 - 8.1 g/dL 7.1 8.0 -  Total Bilirubin 0.3 - 1.2 mg/dL 1.3(H) 1.4(H) -  Alkaline Phos 38 - 126 U/L 71 71 -  AST 15 - 41 U/L 22 30 -  ALT 0 - 44 U/L 20 22 -     Imaging studies:   Last 24 hrs: CT ABDOMEN PELVIS WO CONTRAST  Result Date: 06/20/2021 CLINICAL DATA:  Abdomen pain vomiting EXAM: CT ABDOMEN AND PELVIS WITHOUT CONTRAST TECHNIQUE: Multidetector CT imaging of the abdomen and pelvis was performed following the standard protocol without IV contrast. COMPARISON:  CT 05/28/2020, 11/12/2018 FINDINGS: Lower chest: Lung bases demonstrate no acute consolidation or effusion. Atelectasis or scarring at the bases. Coronary vascular calcification. Hepatobiliary: No focal liver abnormality is seen. Status post cholecystectomy. No biliary dilatation. Pancreas: Unremarkable. No pancreatic ductal dilatation or surrounding inflammatory changes. Spleen: Normal in size without focal abnormality. Adrenals/Urinary Tract: Right adrenal gland is normal. 19 mm left adrenal nodule, stable consistent with adenoma. Kidneys show no hydronephrosis. The bladder is normal Stomach/Bowel: Moderate fluid distension of the stomach. Dilated fluid-filled duodenum and proximal jejunum. Slightly swirled appearance of mesentery and bowel within the right mid abdomen, series 2, image 53 with relatively decompressed remainder of the distal small bowel. No acute bowel wall thickening. Large volume of stool in the colon and rectum. Interval takedown of left abdominal colostomy with reanastomosis at the sigmoid colon. Possible mild: Narrowing slightly distal to the  anastomosis, series 2, image 62 and coronal series 5 image 42. Large stool in the rectum distal to this. Vascular/Lymphatic: Moderate aortic atherosclerosis. No aneurysm. No suspicious nodes Reproductive: Status post hysterectomy. No adnexal masses. Other: No free air. No pelvic effusion. Mild presacral soft tissue stranding. Mild nonspecific soft tissue stranding in left colic gutter. Interval repair of previously noted ventral hernias and parastomal hernias. Small periumbilical region hernia containing fat and small bowel. Musculoskeletal: Degenerative changes. No acute osseous abnormality. Post laminectomy changes L3 through S1. IMPRESSION: 1. Findings suspicious for proximal bowel obstruction with moderate fluid distension of the stomach, duodenum and proximal jejunum with suspected area of transition in the proximal jejunum right mid abdominal region where there is slightly swirled appearance of bowel and mesentery, either due to incomplete volvulus versus hernia versus adhesion. 2. Interval takedown of left abdominal colostomy with reanastomosis at sigmoid colon. Possible caliber change/stricture at the rectosigmoid colon slightly distal to the anastomosis. There is large stool burden throughout the colon with marked rectal distension by feces. There is some perirectal edema and presacral soft tissue stranding. 3. Interval repair of ventral hernias and previously noted parastomal hernia. Small residual ventral hernia containing mesenteric and small bowel. 4. Stable left adrenal adenoma. Electronically Signed   By: Donavan Foil M.D.   On: 06/20/2021 22:43   DG Chest Port 1 View  Result Date: 06/21/2021 CLINICAL DATA:  NG placement. EXAM: PORTABLE CHEST 1 VIEW COMPARISON:  Chest radiograph dated 05/09/2021 and CT dated 05/09/2021. FINDINGS: Enteric tube extends below the diaphragm with side-port in the left upper abdomen and tip beyond  the inferior margin of the image, likely in the stomach. Minimal left  lung base atelectasis/scarring. No focal consolidation, pleural effusion or pneumothorax. The cardiac silhouette is within limits. Osteopenia with degenerative changes the spine. No acute osseous pathology. IMPRESSION: Enteric tube with tip beyond the inferior margin of the image, likely in the stomach. Electronically Signed   By: Anner Crete M.D.   On: 06/21/2021 02:09     Assessment/Plan:  80 y.o. adult with history of prior abdominal surgery, presenting with proximal small bowel obstruction now improved with nasogastric tube for decompression, complicated by pertinent comorbidities including.  Patient Active Problem List   Diagnosis Date Noted   Abdominal pain 06/21/2021   Hypercalcemia 06/21/2021   Serum total bilirubin elevated 06/21/2021   History of schizophrenia 06/21/2021   Small bowel obstruction (HCC) 06/20/2021   Generalized weakness 05/10/2021   Elevated MCV 05/10/2021   Moderate single current episode of major depressive disorder ([place]) 03/12/2021   Incontinence of urine in female 11/21/2020   Skin yeast infection 11/21/2020   Mass of left side of neck 09/12/2020   Colostomy in place [facility]) 09/12/2020   Housing problems 06/06/2020   Healthcare maintenance 06/06/2020   Abdominal wall bulge 04/22/2020   Acute renal failure superimposed on stage 3b chronic kidney disease ([place])    History of colonic polyps 07/06/2019   History of partial colectomy 07/06/2019   Seasonal allergies 03/27/2019   Dysuria 03/27/2019   Obesity (BMI 30-39.9) 11/12/2018   Constipation due to opioid therapy 11/12/2018   Lactic acidosis 11/12/2018   Large bowel perforation ([place]) 11/12/2018   Peritonitis ([place]) 11/12/2018   Stercoral ulcer of large intestine 11/12/2018   Pneumoperitoneum    COPD (chronic obstructive pulmonary disease) ([place]) 12/17/2017   Hypothyroidism 12/17/2017   Steroid-induced psychosis, with hallucinations ([place])    UTI (urinary tract infection) 03/03/2017   Nausea &  vomiting 03/03/2017   Weight loss 03/03/2017   CAP (community acquired pneumonia) 07/22/2016   CKD (chronic kidney disease) stage 3, GFR 30-59 ml/min (HCC) 05/04/2016   Elevated LFTs 05/04/2016   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   Breath shortness 06/19/2014   Syncope 05/31/2014   Rib fracture 05/31/2014   Weight gain 03/21/2014   Cervical spondylosis 08/15/2013   Neck pain on left side 05/24/2013   Unspecified vitamin D deficiency 12/14/2012   Vitamin D toxicity 12/14/2012   Hemorrhoid 09/01/2012   Myalgia 02/25/2012   Essential hypertension 11/25/2011   Cramps, muscle, general 08/18/2011   RECTAL BLEEDING 11/03/2010   Esophageal dysphagia 11/03/2010   FATIGUE 08/29/2010   MUSCLE PAIN 06/04/2010   Cystitis 05/12/2010   RENAL INSUFFICIENCY 12/18/2008   DEPRESSION 09/04/2008   IRRITABLE BOWEL SYNDROME 05/22/2008   HERNIATED CERVICAL DISC 10/05/2007   HERNIATED LUMBAR DISC 10/05/2007   History of urinary tract infection 09/20/2007   HIV-1 associated autonomic neuropathy ([place]) 03/16/2007   ELBOW PAIN 03/16/2007   ISCHEMIC COLITIS 12/27/2006   Acute kidney injury superimposed on CKD ([place]) 12/27/2006   HIV disease ([place]) 08/27/2006    -We will continue observation with nasogastric decompression, IV fluid hydration and serial exams.  -Reassess progress with KUB in a.m.  -PPI prophylaxis  - DVT prophylaxis  Appreciate hospitalist care, will follow with you.  -- Ronny Bacon, M.D., FACS 06/21/2021, 7:54 AM

## 2021-06-21 NOTE — Progress Notes (Signed)
In to check NGT, unable to auscultate air instillation with syringe. Unable to aspirate any stomach contents with syringe and no stomach contents noted in suction tubing. Tube checked and hole noted in rigid tubing near air vent. Unable to attain adequate suction per order.  MD Courage aware. Current NGT removed and new tube 12 Fr tube placed in right nare, inserted to the third black notch. Patient tolerated well, noted greenish bile in tube.

## 2021-06-21 NOTE — Progress Notes (Addendum)
Patient Demographics:    [name], is a 80 y.o. adult, DOB - 07/28/1941, QIH:[id number]  Admit date - 06/20/2021   Admitting Physician Bernadette Hoit, DO  Outpatient Primary MD for the patient is Wannetta Sender, FNP  LOS - 1   Chief Complaint  Patient presents with   Emesis        Subjective:    [name] today has no fevers, no further emesis,  No chest pain,   -Tolerating NG tube well -No flatus and  no BM  Assessment  & Plan :    Principal Problem:   SBO (small bowel obstruction) ([place]) Active Problems:   Acute kidney injury superimposed on CKD (HCC)   HIV disease ([place])   Essential hypertension   Nausea & vomiting   Elevated MCV   Abdominal pain   Hypercalcemia   Serum total bilirubin elevated   History of schizophrenia  Brief Summary :- 80 y.o. adult with medical history significant for  peripheral neuropathy, DDD, depression, COPD, CKD stage IIIB, hypertension, HIV infection admitted on 06/20/2021 with persistent emesis and found to have proximal SBO  A/p 1)-Proximal SBO--- continue NG tube to intermittent suction -No further emesis since placing the NG tube, no flatus -No BM, no fevers -General surgery consult appreciated -Serial clinical exam and serial imaging  2)Constipation/obstipation--- Dulcolax suppository and enema and as ordered -Will need to be on a bowel regimen prior to discharge home  3) urinary symptoms--- UA suggestive of UTI  --treat empirically with IV Rocephin pending culture data  4)AKI----acute kidney injury on CKD stage -3B -Creatinine is currently down to 1.72 from 1.80 on admission Baseline creatinine usually around 1.3 -Continue IV fluids -renally adjust medications, avoid nephrotoxic agents / dehydration  / hypotension  5)History of HIV disease Patient follows with Dr. [name] Continue Triumeq when patient resumes oral  intake  6)GERD--continue Protonix  7)HTN--- BP stable, use IV labetalol as needed elevated BP  Disposition/Need for in-Hospital Stay- patient unable to be discharged at this time due to -small bowel obstruction requiring NG tube and IV fluids pending return of bowel function and tolerance of oral intake*  Status is: Inpatient  Remains inpatient appropriate because: Please see disposition above  Disposition: The patient is from: Home              Anticipated d/c is to: Home              Anticipated d/c date is: > 3 days              Patient currently is not medically stable to d/c. Barriers: Not Clinically Stable-   Code Status : -  Code Status: Full Code   Family Communication:   NA (patient is alert, awake and coherent)   Consults  :  Gen Surg  DVT Prophylaxis  :   - SCDs  heparin injection 5,000 Units Start: 06/21/21 2200 SCDs Start: 06/21/21 0122    Lab Results  Component Value Date   PLT 188 06/21/2021    Inpatient Medications  Scheduled Meds:  bisacodyl  10 mg Rectal Daily   heparin injection (subcutaneous)  5,000 Units Subcutaneous Q8H   pantoprazole (PROTONIX) IV  40 mg Intravenous Q24H   Continuous  Infusions:  sodium chloride 100 mL/hr at 06/21/21 1702   cefTRIAXone (ROCEPHIN)  IV     PRN Meds:.fentaNYL (SUBLIMAZE) injection, labetalol, ondansetron (ZOFRAN) IV, sodium phosphate    Anti-infectives (From admission, onward)    Start     Dose/Rate Route Frequency Ordered Stop   06/21/21 1800  cefTRIAXone (ROCEPHIN) 1 g in sodium chloride 0.9 % 100 mL IVPB        1 g 200 mL/hr over 30 Minutes Intravenous Every 24 hours 06/21/21 1700           Objective:   Vitals:   06/21/21 0030 06/21/21 0108 06/21/21 0558 06/21/21 1340  BP: 101/69 119/62 123/62 102/82  Pulse: 71 71 66 66  Resp: 18 18 18 16   Temp: 98.8 F (37.1 C) 98.2 F (36.8 C) 97.9 F (36.6 C) 98.5 F (36.9 C)  TempSrc: Oral  Oral Oral  SpO2: 95% 97% 97% 98%  Weight:  72.3 kg    Height:   5\' 2"  (1.575 m)      Wt Readings from Last 3 Encounters:  06/21/21 72.3 kg  05/11/21 70.8 kg  12/23/20 74.4 kg     Intake/Output Summary (Last 24 hours) at 06/21/2021 1706 Last data filed at 06/21/2021 1649 Gross per 24 hour  Intake 0 ml  Output 770 ml  Net -770 ml     Physical Exam  Gen:- Awake Alert,  in no apparent distress  HEENT:- [place].AT, No sclera icterus Nose- NGT to intermittent wall suction Neck-Supple Neck,No JVD,.  Lungs-  CTAB , fair symmetrical air movement CV- S1, S2 normal, regular  Abd-diminished bowel sounds, healed scars, abdominal discomfort on palpation no rebound or guarding, no CVA area tenderness Extremity/Skin:- No  edema, pedal pulses present  Psych-affect is appropriate, oriented x3 Neuro-no new focal deficits, no tremors   Data Review:   Micro Results Recent Results (from the past 240 hour(s))  Resp Panel by RT-PCR (Flu A&B, Covid) Nasopharyngeal Swab     Status: None   Collection Time: 06/20/21 11:00 PM   Specimen: Nasopharyngeal Swab; Nasopharyngeal(NP) swabs in vial transport medium  Result Value Ref Range Status   SARS Coronavirus 2 by RT PCR NEGATIVE NEGATIVE Final    Comment: (NOTE) SARS-CoV-2 target nucleic acids are NOT DETECTED.  The SARS-CoV-2 RNA is generally detectable in upper respiratory specimens during the acute phase of infection. The lowest concentration of SARS-CoV-2 viral copies this assay can detect is 138 copies/mL. A negative result does not preclude SARS-Cov-2 infection and should not be used as the sole basis for treatment or other patient management decisions. A negative result may occur with  improper specimen collection/handling, submission of specimen other than nasopharyngeal swab, presence of viral mutation(s) within the areas targeted by this assay, and inadequate number of viral copies(<138 copies/mL). A negative result must be combined with clinical observations, patient history, and  epidemiological information. The expected result is Negative.  Fact Sheet for Patients:  EntrepreneurPulse.com.au  Fact Sheet for Healthcare Providers:  IncredibleEmployment.be  This test is no t yet approved or cleared by the [place] FDA and  has been authorized for detection and/or diagnosis of SARS-CoV-2 by FDA under an Emergency Use Authorization (EUA). This EUA will remain  in effect (meaning this test can be used) for the duration of the COVID-19 declaration under Section 564(b)(1) of the Act, 21 U.S.C.section 360bbb-3(b)(1), unless the authorization is terminated  or revoked sooner.       Influenza A by PCR NEGATIVE NEGATIVE Final  Influenza B by PCR NEGATIVE NEGATIVE Final    Comment: (NOTE) The Xpert Xpress SARS-CoV-2/FLU/RSV plus assay is intended as an aid in the diagnosis of influenza from Nasopharyngeal swab specimens and should not be used as a sole basis for treatment. Nasal washings and aspirates are unacceptable for Xpert Xpress SARS-CoV-2/FLU/RSV testing.  Fact Sheet for Patients: EntrepreneurPulse.com.au  Fact Sheet for Healthcare Providers: IncredibleEmployment.be  This test is not yet approved or cleared by the [place] FDA and has been authorized for detection and/or diagnosis of SARS-CoV-2 by FDA under an Emergency Use Authorization (EUA). This EUA will remain in effect (meaning this test can be used) for the duration of the COVID-19 declaration under Section 564(b)(1) of the Act, 21 U.S.C. section 360bbb-3(b)(1), unless the authorization is terminated or revoked.  Performed at [facility], [address], [place], [place] [address]   MRSA Next Gen by PCR, Nasal     Status: None   Collection Time: 06/21/21  2:13 AM   Specimen: Nasal Mucosa; Nasal Swab  Result Value Ref Range Status   MRSA by PCR Next Gen NOT DETECTED NOT DETECTED Final    Comment: (NOTE) The  GeneXpert MRSA Assay (FDA approved for NASAL specimens only), is one component of a comprehensive MRSA colonization surveillance program. It is not intended to diagnose MRSA infection nor to guide or monitor treatment for MRSA infections. Test performance is not FDA approved in patients less than 106 years old. Performed at [facility], [address], [place], [place] [address]     Radiology Reports CT ABDOMEN PELVIS WO CONTRAST  Result Date: 06/20/2021 CLINICAL DATA:  Abdomen pain vomiting EXAM: CT ABDOMEN AND PELVIS WITHOUT CONTRAST TECHNIQUE: Multidetector CT imaging of the abdomen and pelvis was performed following the standard protocol without IV contrast. COMPARISON:  CT 05/28/2020, 11/12/2018 FINDINGS: Lower chest: Lung bases demonstrate no acute consolidation or effusion. Atelectasis or scarring at the bases. Coronary vascular calcification. Hepatobiliary: No focal liver abnormality is seen. Status post cholecystectomy. No biliary dilatation. Pancreas: Unremarkable. No pancreatic ductal dilatation or surrounding inflammatory changes. Spleen: Normal in size without focal abnormality. Adrenals/Urinary Tract: Right adrenal gland is normal. 19 mm left adrenal nodule, stable consistent with adenoma. Kidneys show no hydronephrosis. The bladder is normal Stomach/Bowel: Moderate fluid distension of the stomach. Dilated fluid-filled duodenum and proximal jejunum. Slightly swirled appearance of mesentery and bowel within the right mid abdomen, series 2, image 53 with relatively decompressed remainder of the distal small bowel. No acute bowel wall thickening. Large volume of stool in the colon and rectum. Interval takedown of left abdominal colostomy with reanastomosis at the sigmoid colon. Possible mild: Narrowing slightly distal to the anastomosis, series 2, image 62 and coronal series 5 image 42. Large stool in the rectum distal to this. Vascular/Lymphatic: Moderate aortic atherosclerosis. No aneurysm.  No suspicious nodes Reproductive: Status post hysterectomy. No adnexal masses. Other: No free air. No pelvic effusion. Mild presacral soft tissue stranding. Mild nonspecific soft tissue stranding in left colic gutter. Interval repair of previously noted ventral hernias and parastomal hernias. Small periumbilical region hernia containing fat and small bowel. Musculoskeletal: Degenerative changes. No acute osseous abnormality. Post laminectomy changes L3 through S1. IMPRESSION: 1. Findings suspicious for proximal bowel obstruction with moderate fluid distension of the stomach, duodenum and proximal jejunum with suspected area of transition in the proximal jejunum right mid abdominal region where there is slightly swirled appearance of bowel and mesentery, either due to incomplete volvulus versus hernia versus adhesion. 2. Interval takedown  of left abdominal colostomy with reanastomosis at sigmoid colon. Possible caliber change/stricture at the rectosigmoid colon slightly distal to the anastomosis. There is large stool burden throughout the colon with marked rectal distension by feces. There is some perirectal edema and presacral soft tissue stranding. 3. Interval repair of ventral hernias and previously noted parastomal hernia. Small residual ventral hernia containing mesenteric and small bowel. 4. Stable left adrenal adenoma. Electronically Signed   By: Donavan Foil M.D.   On: 06/20/2021 22:43   DG Chest Port 1 View  Result Date: 06/21/2021 CLINICAL DATA:  NG placement. EXAM: PORTABLE CHEST 1 VIEW COMPARISON:  Chest radiograph dated 05/09/2021 and CT dated 05/09/2021. FINDINGS: Enteric tube extends below the diaphragm with side-port in the left upper abdomen and tip beyond the inferior margin of the image, likely in the stomach. Minimal left lung base atelectasis/scarring. No focal consolidation, pleural effusion or pneumothorax. The cardiac silhouette is within limits. Osteopenia with degenerative changes the  spine. No acute osseous pathology. IMPRESSION: Enteric tube with tip beyond the inferior margin of the image, likely in the stomach. Electronically Signed   By: Anner Crete M.D.   On: 06/21/2021 02:09   DG Chest Port 1V same Day  Result Date: 06/21/2021 CLINICAL DATA:  Post NG tube Ree insertion. EXAM: PORTABLE CHEST 1 VIEW COMPARISON:  Chest x-ray from earlier same day. FINDINGS: Enteric tube appears appropriately positioned in the stomach. Heart size and mediastinal contours are stable. Lungs are clear. No pleural effusion or pneumothorax is seen. IMPRESSION: Enteric tube appears appropriately positioned in the stomach. Electronically Signed   By: Franki Cabot M.D.   On: 06/21/2021 12:05     CBC Recent Labs  Lab 06/20/21 2102 06/21/21 0449  WBC 10.5 9.3  HGB 14.1 13.2  HCT 40.5 38.6  PLT 218 188  MCV 105.5* 106.3*  MCH 36.7* 36.4*  MCHC 34.8 34.2  RDW 12.5 12.4  LYMPHSABS 1.9  --   MONOABS 0.6  --   EOSABS 0.0  --   BASOSABS 0.0  --     Chemistries  Recent Labs  Lab 06/20/21 2102 06/21/21 0449  NA 134* 139  K 4.7 3.9  CL 91* 97*  CO2 34* 29  GLUCOSE 130* 108*  BUN 42* 44*  CREATININE 1.80* 1.72*  CALCIUM 11.1* 11.0*  MG  --  2.4  AST 30 22  ALT 22 20  ALKPHOS 71 71  BILITOT 1.4* 1.3*   ------------------------------------------------------------------------------------------------------------------ No results for input(s): CHOL, HDL, LDLCALC, TRIG, CHOLHDL, LDLDIRECT in the last 72 hours.  Lab Results  Component Value Date   HGBA1C 4.9 09/30/2020   ------------------------------------------------------------------------------------------------------------------ No results for input(s): TSH, T4TOTAL, T3FREE, THYROIDAB in the last 72 hours.  Invalid input(s): FREET3 ------------------------------------------------------------------------------------------------------------------ Recent Labs    06/21/21 0449  VITAMINB12 634  FOLATE 29.2     Coagulation profile Recent Labs  Lab 06/21/21 0449  INR 1.1    No results for input(s): DDIMER in the last 72 hours.  Cardiac Enzymes No results for input(s): CKMB, TROPONINI, MYOGLOBIN in the last 168 hours.  Invalid input(s): CK ------------------------------------------------------------------------------------------------------------------    Component Value Date/Time   BNP 184.0 (H) 03/29/2017 1704     Roxan Hockey M.D on 06/21/2021 at 5:06 PM  Go to [email or web address] - for contact info  [facility] - Office  [phone]

## 2021-06-21 NOTE — Progress Notes (Signed)
Pt c/o inability to void, purewick in place. Pt repositioned for comfort and able to void 100 ml of cloudy amber urine with foul/fishy smell. Pt states she is having burning with urination and does not feel that her bladder is empty. C/o pain to palpation of lower abd/bladder area. Bladder scan performed, shows > 230ml in bladder post void. MD Courage notified.

## 2021-06-21 NOTE — Progress Notes (Deleted)
Pt assisted up into recliner per pt request. Required assist x1, good strength but unsteady on feet. Took 4 steps to chair and able to sit with controlled descent. Pt is complaining of right upper arm/shoulder area pain, no reddness or swelling noted to area. ROJM exercises being performed by pt's daughter at this time. Pt states ready to eat a little breakfast.  Pt c/o pain in left arm at IV site (left [facility]). Site flushes well but tender to touch. Site removed and will restart in different site once pt finishes breakfast. Pt and daughter agreeable.

## 2021-06-22 ENCOUNTER — Inpatient Hospital Stay (HOSPITAL_COMMUNITY): Payer: Medicare Other

## 2021-06-22 LAB — CBC
HCT: 33.4 % — ABNORMAL LOW (ref 36.0–46.0)
Hemoglobin: 11.3 g/dL — ABNORMAL LOW (ref 12.0–15.0)
MCH: 36.3 pg — ABNORMAL HIGH (ref 26.0–34.0)
MCHC: 33.8 g/dL (ref 30.0–36.0)
MCV: 107.4 fL — ABNORMAL HIGH (ref 80.0–100.0)
Platelets: 169 10*3/uL (ref 150–400)
RBC: 3.11 MIL/uL — ABNORMAL LOW (ref 3.87–5.11)
RDW: 12.4 % (ref 11.5–15.5)
WBC: 6 10*3/uL (ref 4.0–10.5)
nRBC: 0 % (ref 0.0–0.2)

## 2021-06-22 LAB — BASIC METABOLIC PANEL
Anion gap: 10 (ref 5–15)
BUN: 41 mg/dL — ABNORMAL HIGH (ref 8–23)
CO2: 27 mmol/L (ref 22–32)
Calcium: 9.8 mg/dL (ref 8.9–10.3)
Chloride: 104 mmol/L (ref 98–111)
Creatinine, Ser: 1.73 mg/dL — ABNORMAL HIGH (ref 0.44–1.00)
GFR, Estimated: 30 mL/min — ABNORMAL LOW (ref >60)
Glucose, Bld: 84 mg/dL (ref 70–99)
Potassium: 3.9 mmol/L (ref 3.5–5.1)
Sodium: 141 mmol/L (ref 135–145)

## 2021-06-22 LAB — GLUCOSE, CAPILLARY
Glucose-Capillary: 65 mg/dL — ABNORMAL LOW (ref 70–99)
Glucose-Capillary: 73 mg/dL (ref 70–99)
Glucose-Capillary: 87 mg/dL (ref 70–99)

## 2021-06-22 MED ORDER — LORAZEPAM 2 MG/ML IJ SOLN
0.5000 mg | Freq: Four times a day (QID) | INTRAMUSCULAR | Status: DC | PRN
  Administered 2021-06-22 – 2021-06-26 (×6): 0.5 mg via INTRAVENOUS
  Filled 2021-06-22 (×6): qty 1

## 2021-06-22 MED ORDER — ACETAMINOPHEN 650 MG RE SUPP
650.0000 mg | Freq: Four times a day (QID) | RECTAL | Status: DC | PRN
  Administered 2021-06-22: 650 mg via RECTAL
  Filled 2021-06-22: qty 1

## 2021-06-22 NOTE — Progress Notes (Signed)
[name] (cpt [phone])  Hospital Day(s): 2.   Post op day(s):  [name]   Interval History: Patient seen and examined, she reports having bowel movements, essentially at disimpaction following suppository placement and feeling significantly better as a result.  Had her NG tube replaced and still having significant output.  No acute events or new complaints overnight. Patient reports remarkable improvement, denies nausea, flatus, abdominal pain or tenderness.  Review of Systems:  Constitutional: denies fever, chills  HEENT: denies cough or congestion  Respiratory: denies any shortness of breath  Cardiovascular: denies chest pain or palpitations  Gastrointestinal: denies abdominal pain, N/V, or diarrhea/and bowel function as per interval history Genitourinary: denies burning with urination or urinary frequency Musculoskeletal: denies pain, decreased motor or sensation Integumentary: denies any other rashes or skin discolorations Neurological: denies HA or vision/hearing changes   Vital signs in last 24 hours: [min-max] current  Temp:  [98.4 F (36.9 C)-98.9 F (37.2 C)] 98.4 F (36.9 C) (08/28 8299) Pulse Rate:  [66-72] 72 (08/28 0613) Resp:  [16-20] 19 (08/28 0613) BP: (102-128)/(49-82) 128/57 (08/28 0613) SpO2:  [96 %-98 %] 96 % (08/28 0613)     Height: 5\' 2"  (157.5 cm) Weight: 72.3 kg BMI (Calculated): 29.15   Intake/Output last 2 shifts:  08/27 0701 - 08/28 0700 In: 1117.2 [I.V.:1017.2; IV Piggyback:100.1] Out: 53 [Urine:970; Emesis/NG output:700]   Physical Exam:  Constitutional: alert, cooperative and no distress  HENT: normocephalic without obvious abnormality  Eyes: PERRL, EOM's grossly intact and symmetric  Neuro: CN II - XII grossly intact and symmetric without deficit  Respiratory: breathing non-labored at rest  Cardiovascular: regular rate and sinus rhythm  Gastrointestinal: Scarred mid abdomen, otherwise soft, non-tender, and  non-distended Musculoskeletal: no edema or wounds, motor and sensation grossly intact, NT    Labs:  CBC Latest Ref Rng & Units 06/22/2021 06/21/2021 06/20/2021  WBC 4.0 - 10.5 K/uL 6.0 9.3 10.5  Hemoglobin 12.0 - 15.0 g/dL 11.3(L) 13.2 14.1  Hematocrit 36.0 - 46.0 % 33.4(L) 38.6 40.5  Platelets 150 - 400 K/uL 169 188 218   CMP Latest Ref Rng & Units 06/22/2021 06/21/2021 06/20/2021  Glucose 70 - 99 mg/dL 84 108(H) 130(H)  BUN 8 - 23 mg/dL 41(H) 44(H) 42(H)  Creatinine 0.44 - 1.00 mg/dL 1.73(H) 1.72(H) 1.80(H)  Sodium 135 - 145 mmol/L 141 139 134(L)  Potassium 3.5 - 5.1 mmol/L 3.9 3.9 4.7  Chloride 98 - 111 mmol/L 104 97(L) 91(L)  CO2 22 - 32 mmol/L 27 29 34(H)  Calcium 8.9 - 10.3 mg/dL 9.8 11.0(H) 11.1(H)  Total Protein 6.5 - 8.1 g/dL - 7.1 8.0  Total Bilirubin 0.3 - 1.2 mg/dL - 1.3(H) 1.4(H)  Alkaline Phos 38 - 126 U/L - 71 71  AST 15 - 41 U/L - 22 30  ALT 0 - 44 U/L - 20 22     Imaging studies:   CLINICAL DATA:  Nasogastric tube.   EXAM: ABDOMEN - 1 VIEW   COMPARISON:  None.   FINDINGS: The bowel gas pattern is normal. Distal tip of nasogastric tube is seen in the stomach. Status post cholecystectomy. No radio-opaque calculi or other significant radiographic abnormality are seen.   IMPRESSION: Distal tip of nasogastric tube seen in stomach.     Electronically Signed   By: Marijo Conception M.D.   On: 06/22/2021 08:09  Assessment/Plan:  80 y.o. adult with prior surgical history   s/p nasogastric decompression for small bowel obstruction, making clinical progress yet  with persistent significant NG output, complicated by pertinent comorbidities including: Patient Active Problem List   Diagnosis Date Noted   Abdominal pain 06/21/2021   Hypercalcemia 06/21/2021   Serum total bilirubin elevated 06/21/2021   History of schizophrenia 06/21/2021   SBO (small bowel obstruction) (HCC) 06/21/2021   Generalized weakness 05/10/2021   Elevated MCV 05/10/2021   Moderate single  current episode of major depressive disorder ([place]) 03/12/2021   Incontinence of urine in female 11/21/2020   Skin yeast infection 11/21/2020   Mass of left side of neck 09/12/2020   Colostomy in place [facility]) 09/12/2020   Housing problems 06/06/2020   Healthcare maintenance 06/06/2020   Abdominal wall bulge 04/22/2020   Acute renal failure superimposed on stage 3b chronic kidney disease ([place])    History of colonic polyps 07/06/2019   History of partial colectomy 07/06/2019   Seasonal allergies 03/27/2019   Dysuria 03/27/2019   Obesity (BMI 30-39.9) 11/12/2018   Constipation due to opioid therapy 11/12/2018   Lactic acidosis 11/12/2018   Large bowel perforation ([place]) 11/12/2018   Peritonitis ([place]) 11/12/2018   Stercoral ulcer of large intestine 11/12/2018   Pneumoperitoneum    COPD (chronic obstructive pulmonary disease) ([place]) 12/17/2017   Hypothyroidism 12/17/2017   Steroid-induced psychosis, with hallucinations ([place])    UTI (urinary tract infection) 03/03/2017   Nausea & vomiting 03/03/2017   Weight loss 03/03/2017   CAP (community acquired pneumonia) 07/22/2016   CKD (chronic kidney disease) stage 3, GFR 30-59 ml/min (HCC) 05/04/2016   Elevated LFTs 05/04/2016   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   Breath shortness 06/19/2014   Syncope 05/31/2014   Rib fracture 05/31/2014   Weight gain 03/21/2014   Cervical spondylosis 08/15/2013   Neck pain on left side 05/24/2013   Unspecified vitamin D deficiency 12/14/2012   Vitamin D toxicity 12/14/2012   Hemorrhoid 09/01/2012   Myalgia 02/25/2012   Essential hypertension 11/25/2011   Cramps, muscle, general 08/18/2011   RECTAL BLEEDING 11/03/2010   Esophageal dysphagia 11/03/2010   FATIGUE 08/29/2010   MUSCLE PAIN 06/04/2010   Cystitis 05/12/2010   RENAL INSUFFICIENCY 12/18/2008   DEPRESSION 09/04/2008   IRRITABLE BOWEL SYNDROME 05/22/2008   HERNIATED CERVICAL DISC 10/05/2007   HERNIATED LUMBAR DISC 10/05/2007   History of  urinary tract infection 09/20/2007   HIV-1 associated autonomic neuropathy ([place]) 03/16/2007   ELBOW PAIN 03/16/2007   ISCHEMIC COLITIS 12/27/2006   Acute kidney injury superimposed on CKD ([place]) 12/27/2006   HIV disease ([place]) 08/27/2006     -Continue NG tube to low intermittent suction.  -We will keep n.p.o. for now, and follow NG output.  -Anticipating improvement/some return of GI function following disimpaction.  -Continue serial exams, labs, surgery will follow up with repeat KUB in a.m.  All of the above findings and recommendations were discussed with the patient, patient's family, and the medical team, and all of patient's and family's questions were answered to their expressed satisfaction.   -- Ronny Bacon M.D., [facility] 06/22/2021 10:06 AM

## 2021-06-22 NOTE — Progress Notes (Signed)
Pt's daughter [name] called and updated on pt's condition and plan of care. States understanding and says that she will be coming tomorrow to visit pt.

## 2021-06-22 NOTE — Progress Notes (Signed)
Pt c/o headache, MD Courage notified and received order for PR tylenol supp. Upon admin attempt, pt noted to have smear of incont brown stool on bedpad. Pt states she feels like she needs to have a BM, "But I can't push it out." Digital exam of rectal area reveals large, hard balls of stool. Able to digitally remove 5 large hard balls of stool with pt bearing down to assist, then pt passed large amount of soft, sticky brown stool, then passed small amount of liquid stool. Pt states, "That feels so much better!". Suppository admin 'd for headache.

## 2021-06-22 NOTE — Progress Notes (Signed)
Patient Demographics:    [name], is a 80 y.o. adult, DOB - 1941-02-15, YBW:[id number]  Admit date - 06/20/2021   Admitting Physician Bernadette Hoit, DO  Outpatient Primary MD for the patient is Wannetta Sender, FNP  LOS - 2   Chief Complaint  Patient presents with   Emesis        Subjective:    [name] today has no fevers, no further emesis,  No chest pain,   -Tolerating NG tube well -No flatus and  no BM  Assessment  & Plan :    Principal Problem:   SBO (small bowel obstruction) ([place]) Active Problems:   Acute kidney injury superimposed on CKD (HCC)   HIV disease ([place])   Essential hypertension   Nausea & vomiting   Elevated MCV   Abdominal pain   Hypercalcemia   Serum total bilirubin elevated   History of schizophrenia  Brief Summary :- 80 y.o. adult with medical history significant for  peripheral neuropathy, DDD, depression, COPD, CKD stage IIIB, hypertension, HIV infection admitted on 06/20/2021 with persistent emesis and found to have proximal SBO  A/p 1)-Proximal SBO--- continue NG tube to intermittent suction -NG continues to have significant output -No further emesis since placing the NG tube, no flatus -No BM, no fevers -General surgery consult appreciated -Serial clinical exam and serial imaging  2)Constipation/obstipation--- improving with Dulcolax suppository and enema and as ordered -Will need to be on a bowel regimen prior to discharge home  3) urinary symptoms--- UA suggestive of UTI  --treat empirically with IV Rocephin pending culture data  4)AKI----acute kidney injury on CKD stage -3B -Creatinine is currently down to 1.72 from 1.80 on admission Baseline creatinine usually around 1.3 -Continue IV fluids -renally adjust medications, avoid nephrotoxic agents / dehydration  / hypotension  5)History of HIV disease Patient follows with Dr. [name] Continue Triumeq when patient resumes oral intake  6)GERD--continue Protonix  7)HTN--- BP stable, use IV labetalol as needed elevated BP  Disposition/Need for in-Hospital Stay- patient unable to be discharged at this time due to -small bowel obstruction requiring NG tube and IV fluids pending return of bowel function and tolerance of oral intake*  Status is: Inpatient  Remains inpatient appropriate because: Please see disposition above  Disposition: The patient is from: Home              Anticipated d/c is to: Home              Anticipated d/c date is: > 3 days              Patient currently is not medically stable to d/c. Barriers: Not Clinically Stable-   Code Status : -  Code Status: Full Code   Family Communication:   NA (patient is alert, awake and coherent)   Consults  :  Gen Surg  DVT Prophylaxis  :   - SCDs  heparin injection 5,000 Units Start: 06/21/21 2200 SCDs Start: 06/21/21 0122    Lab Results  Component Value Date   PLT 169 06/22/2021    Inpatient Medications  Scheduled Meds:  bisacodyl  10 mg Rectal Daily   heparin injection (subcutaneous)  5,000 Units Subcutaneous Q8H   pantoprazole (PROTONIX) IV  40 mg Intravenous Q24H   Continuous Infusions:  sodium chloride 100 mL/hr at 06/22/21 1351   cefTRIAXone (ROCEPHIN)  IV 1 g (06/22/21 1854)   PRN Meds:.acetaminophen, fentaNYL (SUBLIMAZE) injection, labetalol, LORazepam, ondansetron (ZOFRAN) IV, sodium phosphate    Anti-infectives (From admission, onward)    Start     Dose/Rate Route Frequency Ordered Stop   06/21/21 1800  cefTRIAXone (ROCEPHIN) 1 g in sodium chloride 0.9 % 100 mL IVPB        1 g 200 mL/hr over 30 Minutes Intravenous Every 24 hours 06/21/21 1700           Objective:   Vitals:   06/21/21 1340 06/21/21 2146 06/22/21 0613 06/22/21 1451  BP: 102/82 (!) 103/49 (!) 128/57 (!) 126/58  Pulse: 66 71 72 72  Resp: 16 20 19 19   Temp: 98.5 F (36.9 C) 98.9 F (37.2 C) 98.4 F (36.9  C) 98.6 F (37 C)  TempSrc: Oral Oral Oral Oral  SpO2: 98% 98% 96% 96%  Weight:      Height:        Wt Readings from Last 3 Encounters:  06/21/21 72.3 kg  05/11/21 70.8 kg  12/23/20 74.4 kg     Intake/Output Summary (Last 24 hours) at 06/22/2021 1916 Last data filed at 06/22/2021 1854 Gross per 24 hour  Intake 905 ml  Output 2350 ml  Net -1445 ml    Physical Exam  Gen:- Awake Alert,  in no apparent distress  HEENT:- [place].AT, No sclera icterus Nose- NGT to intermittent wall suction Neck-Supple Neck,No JVD,.  Lungs-  CTAB , fair symmetrical air movement CV- S1, S2 normal, regular  Abd-diminished bowel sounds, healed scars, abdominal discomfort on palpation no rebound or guarding, no CVA area tenderness Extremity/Skin:- No  edema, pedal pulses present  Psych-affect is appropriate, oriented x3 Neuro-no new focal deficits, no tremors   Data Review:   Micro Results Recent Results (from the past 240 hour(s))  Resp Panel by RT-PCR (Flu A&B, Covid) Nasopharyngeal Swab     Status: None   Collection Time: 06/20/21 11:00 PM   Specimen: Nasopharyngeal Swab; Nasopharyngeal(NP) swabs in vial transport medium  Result Value Ref Range Status   SARS Coronavirus 2 by RT PCR NEGATIVE NEGATIVE Final    Comment: (NOTE) SARS-CoV-2 target nucleic acids are NOT DETECTED.  The SARS-CoV-2 RNA is generally detectable in upper respiratory specimens during the acute phase of infection. The lowest concentration of SARS-CoV-2 viral copies this assay can detect is 138 copies/mL. A negative result does not preclude SARS-Cov-2 infection and should not be used as the sole basis for treatment or other patient management decisions. A negative result may occur with  improper specimen collection/handling, submission of specimen other than nasopharyngeal swab, presence of viral mutation(s) within the areas targeted by this assay, and inadequate number of viral copies(<138 copies/mL). A negative result  must be combined with clinical observations, patient history, and epidemiological information. The expected result is Negative.  Fact Sheet for Patients:  EntrepreneurPulse.com.au  Fact Sheet for Healthcare Providers:  IncredibleEmployment.be  This test is no t yet approved or cleared by the [place] FDA and  has been authorized for detection and/or diagnosis of SARS-CoV-2 by FDA under an Emergency Use Authorization (EUA). This EUA will remain  in effect (meaning this test can be used) for the duration of the COVID-19 declaration under Section 564(b)(1) of the Act, 21 U.S.C.section 360bbb-3(b)(1), unless the authorization is terminated  or revoked sooner.  Influenza A by PCR NEGATIVE NEGATIVE Final   Influenza B by PCR NEGATIVE NEGATIVE Final    Comment: (NOTE) The Xpert Xpress SARS-CoV-2/FLU/RSV plus assay is intended as an aid in the diagnosis of influenza from Nasopharyngeal swab specimens and should not be used as a sole basis for treatment. Nasal washings and aspirates are unacceptable for Xpert Xpress SARS-CoV-2/FLU/RSV testing.  Fact Sheet for Patients: EntrepreneurPulse.com.au  Fact Sheet for Healthcare Providers: IncredibleEmployment.be  This test is not yet approved or cleared by the [place] FDA and has been authorized for detection and/or diagnosis of SARS-CoV-2 by FDA under an Emergency Use Authorization (EUA). This EUA will remain in effect (meaning this test can be used) for the duration of the COVID-19 declaration under Section 564(b)(1) of the Act, 21 U.S.C. section 360bbb-3(b)(1), unless the authorization is terminated or revoked.  Performed at [facility], [address] [place], [place] [address]   MRSA Next Gen by PCR, Nasal     Status: None   Collection Time: 06/21/21  2:13 AM   Specimen: Nasal Mucosa; Nasal Swab  Result Value Ref Range Status   MRSA by PCR Next  Gen NOT DETECTED NOT DETECTED Final    Comment: (NOTE) The GeneXpert MRSA Assay (FDA approved for NASAL specimens only), is one component of a comprehensive MRSA colonization surveillance program. It is not intended to diagnose MRSA infection nor to guide or monitor treatment for MRSA infections. Test performance is not FDA approved in patients less than 26 years old. Performed at [facility], [address] [place], [place] [address]     Radiology Reports CT ABDOMEN PELVIS WO CONTRAST  Result Date: 06/20/2021 CLINICAL DATA:  Abdomen pain vomiting EXAM: CT ABDOMEN AND PELVIS WITHOUT CONTRAST TECHNIQUE: Multidetector CT imaging of the abdomen and pelvis was performed following the standard protocol without IV contrast. COMPARISON:  CT 05/28/2020, 11/12/2018 FINDINGS: Lower chest: Lung bases demonstrate no acute consolidation or effusion. Atelectasis or scarring at the bases. Coronary vascular calcification. Hepatobiliary: No focal liver abnormality is seen. Status post cholecystectomy. No biliary dilatation. Pancreas: Unremarkable. No pancreatic ductal dilatation or surrounding inflammatory changes. Spleen: Normal in size without focal abnormality. Adrenals/Urinary Tract: Right adrenal gland is normal. 19 mm left adrenal nodule, stable consistent with adenoma. Kidneys show no hydronephrosis. The bladder is normal Stomach/Bowel: Moderate fluid distension of the stomach. Dilated fluid-filled duodenum and proximal jejunum. Slightly swirled appearance of mesentery and bowel within the right mid abdomen, series 2, image 53 with relatively decompressed remainder of the distal small bowel. No acute bowel wall thickening. Large volume of stool in the colon and rectum. Interval takedown of left abdominal colostomy with reanastomosis at the sigmoid colon. Possible mild: Narrowing slightly distal to the anastomosis, series 2, image 62 and coronal series 5 image 42. Large stool in the rectum distal to this.  Vascular/Lymphatic: Moderate aortic atherosclerosis. No aneurysm. No suspicious nodes Reproductive: Status post hysterectomy. No adnexal masses. Other: No free air. No pelvic effusion. Mild presacral soft tissue stranding. Mild nonspecific soft tissue stranding in left colic gutter. Interval repair of previously noted ventral hernias and parastomal hernias. Small periumbilical region hernia containing fat and small bowel. Musculoskeletal: Degenerative changes. No acute osseous abnormality. Post laminectomy changes L3 through S1. IMPRESSION: 1. Findings suspicious for proximal bowel obstruction with moderate fluid distension of the stomach, duodenum and proximal jejunum with suspected area of transition in the proximal jejunum right mid abdominal region where there is slightly swirled appearance of bowel and mesentery, either due to  incomplete volvulus versus hernia versus adhesion. 2. Interval takedown of left abdominal colostomy with reanastomosis at sigmoid colon. Possible caliber change/stricture at the rectosigmoid colon slightly distal to the anastomosis. There is large stool burden throughout the colon with marked rectal distension by feces. There is some perirectal edema and presacral soft tissue stranding. 3. Interval repair of ventral hernias and previously noted parastomal hernia. Small residual ventral hernia containing mesenteric and small bowel. 4. Stable left adrenal adenoma. Electronically Signed   By: Donavan Foil M.D.   On: 06/20/2021 22:43   DG Abd 1 View  Result Date: 06/22/2021 CLINICAL DATA:  Nasogastric tube. EXAM: ABDOMEN - 1 VIEW COMPARISON:  None. FINDINGS: The bowel gas pattern is normal. Distal tip of nasogastric tube is seen in the stomach. Status post cholecystectomy. No radio-opaque calculi or other significant radiographic abnormality are seen. IMPRESSION: Distal tip of nasogastric tube seen in stomach. Electronically Signed   By: Marijo Conception M.D.   On: 06/22/2021 08:09   DG  Chest Port 1 View  Result Date: 06/21/2021 CLINICAL DATA:  NG placement. EXAM: PORTABLE CHEST 1 VIEW COMPARISON:  Chest radiograph dated 05/09/2021 and CT dated 05/09/2021. FINDINGS: Enteric tube extends below the diaphragm with side-port in the left upper abdomen and tip beyond the inferior margin of the image, likely in the stomach. Minimal left lung base atelectasis/scarring. No focal consolidation, pleural effusion or pneumothorax. The cardiac silhouette is within limits. Osteopenia with degenerative changes the spine. No acute osseous pathology. IMPRESSION: Enteric tube with tip beyond the inferior margin of the image, likely in the stomach. Electronically Signed   By: Anner Crete M.D.   On: 06/21/2021 02:09   DG Chest Port 1V same Day  Result Date: 06/21/2021 CLINICAL DATA:  Post NG tube Ree insertion. EXAM: PORTABLE CHEST 1 VIEW COMPARISON:  Chest x-ray from earlier same day. FINDINGS: Enteric tube appears appropriately positioned in the stomach. Heart size and mediastinal contours are stable. Lungs are clear. No pleural effusion or pneumothorax is seen. IMPRESSION: Enteric tube appears appropriately positioned in the stomach. Electronically Signed   By: Franki Cabot M.D.   On: 06/21/2021 12:05     CBC Recent Labs  Lab 06/20/21 2102 06/21/21 0449 06/22/21 0653  WBC 10.5 9.3 6.0  HGB 14.1 13.2 11.3*  HCT 40.5 38.6 33.4*  PLT 218 188 169  MCV 105.5* 106.3* 107.4*  MCH 36.7* 36.4* 36.3*  MCHC 34.8 34.2 33.8  RDW 12.5 12.4 12.4  LYMPHSABS 1.9  --   --   MONOABS 0.6  --   --   EOSABS 0.0  --   --   BASOSABS 0.0  --   --     Chemistries  Recent Labs  Lab 06/20/21 2102 06/21/21 0449 06/22/21 0653  NA 134* 139 141  K 4.7 3.9 3.9  CL 91* 97* 104  CO2 34* 29 27  GLUCOSE 130* 108* 84  BUN 42* 44* 41*  CREATININE 1.80* 1.72* 1.73*  CALCIUM 11.1* 11.0* 9.8  MG  --  2.4  --   AST 30 22  --   ALT 22 20  --   ALKPHOS 71 71  --   BILITOT 1.4* 1.3*  --     ------------------------------------------------------------------------------------------------------------------ No results for input(s): CHOL, HDL, LDLCALC, TRIG, CHOLHDL, LDLDIRECT in the last 72 hours.  Lab Results  Component Value Date   HGBA1C 4.9 09/30/2020   ------------------------------------------------------------------------------------------------------------------ No results for input(s): TSH, T4TOTAL, T3FREE, THYROIDAB in the last 72 hours.  Invalid input(s): FREET3 ------------------------------------------------------------------------------------------------------------------ Recent Labs    06/21/21 0449  VITAMINB12 634  FOLATE 29.2    Coagulation profile Recent Labs  Lab 06/21/21 0449  INR 1.1    No results for input(s): DDIMER in the last 72 hours.  Cardiac Enzymes No results for input(s): CKMB, TROPONINI, MYOGLOBIN in the last 168 hours.  Invalid input(s): CK ------------------------------------------------------------------------------------------------------------------    Component Value Date/Time   BNP 184.0 (H) 03/29/2017 1704     Roxan Hockey M.D on 06/22/2021 at 7:16 PM  Go to [email or web address] - for contact info  [facility] - Office  [phone]

## 2021-06-23 LAB — RENAL FUNCTION PANEL
Albumin: 3.7 g/dL (ref 3.5–5.0)
Anion gap: 10 (ref 5–15)
BUN: 31 mg/dL — ABNORMAL HIGH (ref 8–23)
CO2: 24 mmol/L (ref 22–32)
Calcium: 10 mg/dL (ref 8.9–10.3)
Chloride: 107 mmol/L (ref 98–111)
Creatinine, Ser: 1.29 mg/dL — ABNORMAL HIGH (ref 0.44–1.00)
GFR, Estimated: 42 mL/min — ABNORMAL LOW (ref >60)
Glucose, Bld: 69 mg/dL — ABNORMAL LOW (ref 70–99)
Phosphorus: 2.5 mg/dL (ref 2.5–4.6)
Potassium: 3.2 mmol/L — ABNORMAL LOW (ref 3.5–5.1)
Sodium: 141 mmol/L (ref 135–145)

## 2021-06-23 LAB — GLUCOSE, CAPILLARY
Glucose-Capillary: 69 mg/dL — ABNORMAL LOW (ref 70–99)
Glucose-Capillary: 100 mg/dL — ABNORMAL HIGH (ref 70–99)
Glucose-Capillary: 123 mg/dL — ABNORMAL HIGH (ref 70–99)
Glucose-Capillary: 78 mg/dL (ref 70–99)
Glucose-Capillary: 95 mg/dL (ref 70–99)

## 2021-06-23 MED ORDER — DEXTROSE 50 % IV SOLN
INTRAVENOUS | Status: AC
  Administered 2021-06-23: 50 mL
  Filled 2021-06-23: qty 50

## 2021-06-23 MED ORDER — POTASSIUM CHLORIDE 10 MEQ/100ML IV SOLN
10.0000 meq | INTRAVENOUS | Status: AC
  Administered 2021-06-23 (×2): 10 meq via INTRAVENOUS
  Filled 2021-06-23: qty 100

## 2021-06-23 MED ORDER — KCL IN DEXTROSE-NACL 20-5-0.45 MEQ/L-%-% IV SOLN
INTRAVENOUS | Status: DC
Start: 2021-06-23 — End: 2021-06-26

## 2021-06-23 MED ORDER — DEXTROSE-NACL 5-0.9 % IV SOLN: INTRAVENOUS | Status: DC

## 2021-06-23 MED ORDER — DEXTROSE 50 % IV SOLN
INTRAVENOUS | Status: AC
  Filled 2021-06-23: qty 50

## 2021-06-23 NOTE — Progress Notes (Signed)
No nausea last night.  NG tube output 700 mls green.  Half cup ice chips.  Multiple IV attempts and ED nurse called 3 times to use ultrasound and too busy to help. 24 obtained in left wrist without ultrasound.   Blood glucose below 70 last night.  Contacted Dr.  Orie Rout and IV fluids switched to D5 ns at 50/hr. Patient hungry and asking to eat.  Two incontinent bms last night.

## 2021-06-23 NOTE — Progress Notes (Signed)
RN called due to persistent hypoglycemia.  She was started on IV D5 NS, IV NS held.  Continue to monitor CBG and treat glucose levels accordingly

## 2021-06-23 NOTE — Evaluation (Signed)
Physical Therapy Evaluation Patient Details Name: [name] MRN: [id number] DOB: 1941/08/19 Today's Date: 06/23/2021   History of Present Illness  [name] is an 80 y.o. adult with medical history significant for   peripheral neuropathy, DDD, depression, COPD, CKD stage IIIB, hypertension, HIV infection presents to the emergency department due to 4-day onset of nonbloody, but bilious vomiting.  Vomitus was 5-6 episodes daily and this was associated with sharp abdominal pain which was intermittent and was rated as 8/10 on pain scale with no known alleviating/aggravating factors.  She states that she has not been able to keep any food down.  Last bowel movement was yesterday and it was of normal consistency.  She complained of lightheadedness and dizziness, but she denies fever, chills, chest pain, shortness of breath.  She was recently admitted from 7/15 to 05/17/2021 due to CAP POA which was treated with ceftriaxone, azithromycin and Flagyl and was transitioned to Augmentin on discharge.   Clinical Impression  Patient demonstrates slow labored movement for sitting up at bedside requiring Max assist to scoot to EOB, very unsteady on feet and unable to advance LLE due to weakness requiring Max tactile assistance to slide foot, limited secondary to fatigue and incontinent of stool when standing.  Patient will benefit from continued physical therapy in hospital and recommended venue below to increase strength, balance, endurance for safe ADLs and gait.      Follow Up Recommendations SNF    Equipment Recommendations  None recommended by PT    Recommendations for Other Services       Precautions / Restrictions Precautions Precautions: Fall Restrictions Weight Bearing Restrictions: No      Mobility  Bed Mobility Overal bed mobility: Needs Assistance Bed Mobility: Rolling;Sidelying to Sit Rolling: Min assist;Mod assist Sidelying to sit: Mod assist;Max assist Supine to sit: Mod  assist;Max assist          Transfers Overall transfer level: Needs assistance Equipment used: Rolling walker (2 wheeled) Transfers: Sit to/from Stand Sit to Stand: Max assist            Ambulation/Gait Ambulation/Gait assistance: Max Web designer (Feet): 3 Feet Assistive device: Rolling walker (2 wheeled) Gait Pattern/deviations: Decreased step length - right;Decreased step length - left;Decreased stride length;Shuffle Gait velocity: decreased   General Gait Details: limited to a few steps at bedside requiring Max tactile cueing to move LLE due to weakness, limited most due to fatigue and generalized weakness and incontinent of stool while standing  Stairs            Wheelchair Mobility    Modified Rankin (Stroke Patients Only)       Balance Overall balance assessment: Needs assistance Sitting-balance support: Feet supported;No upper extremity supported Sitting balance-Leahy Scale: Fair Sitting balance - Comments: fair/good seated at EOB   Standing balance support: During functional activity;Bilateral upper extremity supported Standing balance-Leahy Scale: Poor Standing balance comment: using RW                             Pertinent Vitals/Pain Pain Assessment: No/denies pain    Home Living Family/patient expects to be discharged to:: Private residence Living Arrangements: Non-relatives/Friends Available Help at Discharge: Available 24 hours/day;Family;Friend(s) Type of Home: Apartment Home Access: Stairs to enter Entrance Stairs-Rails: Right;Left;Can reach both Entrance Stairs-Number of Steps: 4 Home Layout: One level Home Equipment: Walker - 2 wheels;Cane - single point;Bedside commode;Shower seat;Cane - quad      Prior  Function Level of Independence: Needs assistance   Gait / Transfers Assistance Needed: Household ambulator with RW or cane, last 2 months has been bedbound.  ADL's / Homemaking Assistance Needed: Assit from  roommate and famil        Hand Dominance   Dominant Hand: Right    Extremity/Trunk Assessment   Upper Extremity Assessment Upper Extremity Assessment: Generalized weakness    Lower Extremity Assessment Lower Extremity Assessment: Generalized weakness    Cervical / Trunk Assessment Cervical / Trunk Assessment: Normal  Communication   Communication: No difficulties  Cognition Arousal/Alertness: Awake/alert Behavior During Therapy: WFL for tasks assessed/performed Overall Cognitive Status: Within Functional Limits for tasks assessed                                        General Comments      Exercises     Assessment/Plan    PT Assessment Patient needs continued PT services  PT Problem List Decreased strength;Decreased activity tolerance;Decreased balance;Decreased mobility       PT Treatment Interventions DME instruction;Gait training;Functional mobility training;Therapeutic activities;Therapeutic exercise;Balance training;Patient/family education;Stair training    PT Goals (Current goals can be found in the Care Plan section)  Acute Rehab PT Goals Patient Stated Goal: return home after rehab PT Goal Formulation: With patient Time For Goal Achievement: 07/07/21 Potential to Achieve Goals: Fair    Frequency Min 3X/week   Barriers to discharge        Co-evaluation               AM-PAC PT "6 Clicks" Mobility  Outcome Measure Help needed turning from your back to your side while in a flat bed without using bedrails?: A Lot Help needed moving from lying on your back to sitting on the side of a flat bed without using bedrails?: A Lot Help needed moving to and from a bed to a chair (including a wheelchair)?: A Lot Help needed standing up from a chair using your arms (e.g., wheelchair or bedside chair)?: A Lot Help needed to walk in hospital room?: Total Help needed climbing 3-5 steps with a railing? : Total 6 Click Score: 10    End of  Session   Activity Tolerance: Patient tolerated treatment well;Patient limited by fatigue Patient left: in bed;with call bell/phone within reach Nurse Communication: Mobility status PT Visit Diagnosis: Unsteadiness on feet (R26.81);Other abnormalities of gait and mobility (R26.89);Muscle weakness (generalized) (M62.81)    Time: 3785-8850 PT Time Calculation (min) (ACUTE ONLY): 23 min   Charges:   PT Evaluation $PT Eval Moderate Complexity: 1 Mod PT Treatments $Therapeutic Activity: 23-37 mins        3:54 PM, 06/23/21 Lonell Grandchild, MPT Physical Therapist with [facility] 336 [phone] office [phone] mobile phone

## 2021-06-23 NOTE — NC FL2 (Signed)
[place] LEVEL OF CARE SCREENING TOOL     IDENTIFICATION  Patient Name: [name] Birthdate: 01-01-1941 Sex: adult Admission Date (Current Location): 06/20/2021  [facility] and [place] Number:  [employer] and Address:  [facility] [address] [place]      Provider Number: [id number]  Attending Physician Name and Address:  Roxan Hockey, MD  Relative Name and Phone Number:       Current Level of Care: Hospital Recommended Level of Care: [place] Prior Approval Number:    Date Approved/Denied:   PASRR Number: [id number]  Discharge Plan: SNF    Current Diagnoses: Patient Active Problem List   Diagnosis Date Noted   Abdominal pain 06/21/2021   Hypercalcemia 06/21/2021   Serum total bilirubin elevated 06/21/2021   History of schizophrenia 06/21/2021   SBO (small bowel obstruction) ([place]) 06/21/2021   Generalized weakness 05/10/2021   Elevated MCV 05/10/2021   Moderate single current episode of major depressive disorder ([place]) 03/12/2021   Incontinence of urine in female 11/21/2020   Skin yeast infection 11/21/2020   Mass of left side of neck 09/12/2020   Colostomy in place [facility]) 09/12/2020   Housing problems 06/06/2020   Healthcare maintenance 06/06/2020   Abdominal wall bulge 04/22/2020   Acute renal failure superimposed on stage 3b chronic kidney disease ([place])    History of colonic polyps 07/06/2019   History of partial colectomy 07/06/2019   Seasonal allergies 03/27/2019   Dysuria 03/27/2019   Obesity (BMI 30-39.9) 11/12/2018   Constipation due to opioid therapy 11/12/2018   Lactic acidosis 11/12/2018   Large bowel perforation ([place]) 11/12/2018   Peritonitis ([place]) 11/12/2018   Stercoral ulcer of large intestine 11/12/2018   Pneumoperitoneum    COPD (chronic obstructive pulmonary disease) ([place]) 12/17/2017   Hypothyroidism 12/17/2017   Steroid-induced psychosis, with hallucinations  ([place])    UTI (urinary tract infection) 03/03/2017   Nausea & vomiting 03/03/2017   Weight loss 03/03/2017   CAP (community acquired pneumonia) 07/22/2016   CKD (chronic kidney disease) stage 3, GFR 30-59 ml/min (HCC) 05/04/2016   Elevated LFTs 05/04/2016   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   Breath shortness 06/19/2014   Syncope 05/31/2014   Rib fracture 05/31/2014   Weight gain 03/21/2014   Cervical spondylosis 08/15/2013   Neck pain on left side 05/24/2013   Unspecified vitamin D deficiency 12/14/2012   Vitamin D toxicity 12/14/2012   Hemorrhoid 09/01/2012   Myalgia 02/25/2012   Essential hypertension 11/25/2011   Cramps, muscle, general 08/18/2011   RECTAL BLEEDING 11/03/2010   Esophageal dysphagia 11/03/2010   FATIGUE 08/29/2010   MUSCLE PAIN 06/04/2010   Cystitis 05/12/2010   RENAL INSUFFICIENCY 12/18/2008   DEPRESSION 09/04/2008   IRRITABLE BOWEL SYNDROME 05/22/2008   HERNIATED CERVICAL DISC 10/05/2007   HERNIATED LUMBAR DISC 10/05/2007   History of urinary tract infection 09/20/2007   HIV-1 associated autonomic neuropathy ([place]) 03/16/2007   ELBOW PAIN 03/16/2007   ISCHEMIC COLITIS 12/27/2006   Acute kidney injury superimposed on CKD ([place]) 12/27/2006   HIV disease ([place]) 08/27/2006    Orientation RESPIRATION BLADDER Height & Weight     Self, Time, Situation, Place  Normal External catheter Weight: 159 lb 6.3 oz (72.3 kg) Height:  5\' 2"  (157.5 cm)  BEHAVIORAL SYMPTOMS/MOOD NEUROLOGICAL BOWEL NUTRITION STATUS      Incontinent Diet (NPO time specified. See d/c summary for updates.)  AMBULATORY STATUS COMMUNICATION OF NEEDS Skin   Extensive Assist Verbally Normal  Personal Care Assistance Level of Assistance  Bathing, Dressing, Feeding Bathing Assistance: Maximum assistance Feeding assistance: Limited assistance Dressing Assistance: Maximum assistance     Functional Limitations Info  Sight, Hearing, Speech Sight Info: Adequate Hearing  Info: Adequate Speech Info: Adequate    SPECIAL CARE FACTORS FREQUENCY  PT (By licensed PT)     PT Frequency: 5x weekly              Contractures      Additional Factors Info  Code Status, Allergies, Psychotropic Code Status Info: Full code Allergies Info: Bee venom, Promethazine, Tenofovir Disoproxil, Fumarate (tenofovir disoproxil Fumarate), Compazine (prochlorperazine Edisylate), Haloperidol Lactate, Sulfa Antibiotics, Brompheniramine-acetaminophen, Chlorcyclizine, Chlorpromazine, Codeine, Navane (thiothixene), Prednisone, Promethazine Hcl, Propoxyphene N-acetaminophen, Morphine. Psychotropic Info: Klonopin, Cymbalta, Haldol         Current Medications (06/23/2021):  This is the current hospital active medication list Current Facility-Administered Medications  Medication Dose Route Frequency Provider Last Rate Last Admin   acetaminophen (TYLENOL) suppository 650 mg  650 mg Rectal Q6H PRN [place] Brick, Courage, MD   650 mg at 06/22/21 0856   cefTRIAXone (ROCEPHIN) 1 g in sodium chloride 0.9 % 100 mL IVPB  1 g Intravenous Q24H Roxan Hockey, MD   Stopped at 06/22/21 1927   dextrose 5 % and 0.45 % NaCl with KCl 20 mEq/L infusion   Intravenous Continuous Emokpae, Courage, MD       dextrose 50 % solution            fentaNYL (SUBLIMAZE) injection 25 mcg  25 mcg Intravenous Q2H PRN Adefeso, Oladapo, DO       heparin injection 5,000 Units  5,000 Units Subcutaneous Q8H Emokpae, Courage, MD   5,000 Units at 06/23/21 0559   labetalol (NORMODYNE) injection 10 mg  10 mg Intravenous Q4H PRN Emokpae, Courage, MD       LORazepam (ATIVAN) injection 0.5 mg  0.5 mg Intravenous Q6H PRN [place] Brick, Courage, MD   0.5 mg at 06/22/21 2124   ondansetron (ZOFRAN) injection 4 mg  4 mg Intravenous Q6H PRN Adefeso, Oladapo, DO       pantoprazole (PROTONIX) injection 40 mg  40 mg Intravenous Q24H Adefeso, Oladapo, DO   40 mg at 06/22/21 2123   potassium chloride 10 mEq in 100 mL IVPB  10 mEq Intravenous Q1 Hr  x 2 Emokpae, Courage, MD 100 mL/hr at 06/23/21 1049 10 mEq at 06/23/21 1049   sodium phosphate (FLEET) 7-19 GM/118ML enema 1 enema  1 enema Rectal Daily PRN Ronny Bacon, MD         Discharge Medications: Please see discharge summary for a list of discharge medications.  Relevant Imaging Results:  Relevant Lab Results:   Additional Information    Salome Arnt, LCSW

## 2021-06-23 NOTE — Progress Notes (Addendum)
  Subjective: Pt notes that she was able to have a bowel movement last night after disimpaction. She has not passed any gas overnight. She denies any abdominal pain besides lower pelvic pain which she relates to her recent UTI. She denies nausea or vomiting as well. Overall feeling quite well.  Objective: Vital signs in last 24 hours: Temp:  [98.3 F (36.8 C)-99.1 F (37.3 C)] 98.3 F (36.8 C) (08/29 0602) Pulse Rate:  [67-72] 67 (08/29 0602) Resp:  [18-19] 18 (08/29 0602) BP: (126-143)/(58-61) 143/61 (08/29 0602) SpO2:  [96 %-100 %] 98 % (08/29 0602) Last BM Date: 06/22/21  Intake/Output from previous day: 08/28 0701 - 08/29 0700 In: 1026.6 [I.V.:996.6; NG/GT:30] Out: 2200 [Urine:350; Emesis/NG output:1850] Intake/Output this shift: No intake/output data recorded.  General appearance: alert, cooperative, and no distress Resp: clear to auscultation bilaterally Cardio: regular rate and rhythm, S1, S2 normal, no murmur, click, rub or gallop GI: soft, non-tender; bowel sounds normal; no masses,  no organomegaly  Lab Results:  Recent Labs    06/21/21 0449 06/22/21 0653  WBC 9.3 6.0  HGB 13.2 11.3*  HCT 38.6 33.4*  PLT 188 169   BMET Recent Labs    06/22/21 0653 06/23/21 0539  NA 141 141  K 3.9 3.2*  CL 104 107  CO2 27 24  GLUCOSE 84 69*  BUN 41* 31*  CREATININE 1.73* 1.29*  CALCIUM 9.8 10.0   PT/INR Recent Labs    06/21/21 0449  LABPROT 14.4  INR 1.1    Studies/Results: DG Abd 1 View  Result Date: 06/22/2021 CLINICAL DATA:  Nasogastric tube. EXAM: ABDOMEN - 1 VIEW COMPARISON:  None. FINDINGS: The bowel gas pattern is normal. Distal tip of nasogastric tube is seen in the stomach. Status post cholecystectomy. No radio-opaque calculi or other significant radiographic abnormality are seen. IMPRESSION: Distal tip of nasogastric tube seen in stomach. Electronically Signed   By: Marijo Conception M.D.   On: 06/22/2021 08:09   DG Chest Port 1V same Day  Result  Date: 06/21/2021 CLINICAL DATA:  Post NG tube Ree insertion. EXAM: PORTABLE CHEST 1 VIEW COMPARISON:  Chest x-ray from earlier same day. FINDINGS: Enteric tube appears appropriately positioned in the stomach. Heart size and mediastinal contours are stable. Lungs are clear. No pleural effusion or pneumothorax is seen. IMPRESSION: Enteric tube appears appropriately positioned in the stomach. Electronically Signed   By: Franki Cabot M.D.   On: 06/21/2021 12:05    Anti-infectives: Anti-infectives (From admission, onward)    Start     Dose/Rate Route Frequency Ordered Stop   06/21/21 1800  cefTRIAXone (ROCEPHIN) 1 g in sodium chloride 0.9 % 100 mL IVPB        1 g 200 mL/hr over 30 Minutes Intravenous Every 24 hours 06/21/21 1700         Assessment/Plan: Impression: Hospital Day 3 for this 80 y/o woman with 8/26 CT-demonstrated proximal small bowel obstruction.  Achieved bowel movement last night. NG decompression with 1.85L removed yesterday, and 363mL removed so far today.   Plan: -NG tube with low intermittent suction, will continue this and keep NPO -Will follow repeat KUB and labs today     LOS: 3 days    [name] A [place] Stay 06/23/2021

## 2021-06-23 NOTE — Plan of Care (Signed)
  Problem: Acute Rehab PT Goals(only PT should resolve) Goal: Pt Will Go Supine/Side To Sit Outcome: Progressing Flowsheets (Taken 06/23/2021 1555) Pt will go Supine/Side to Sit:  with minimal assist  with moderate assist Goal: Patient Will Transfer Sit To/From Stand Outcome: Progressing Flowsheets (Taken 06/23/2021 1555) Patient will transfer sit to/from stand: with moderate assist Goal: Pt Will Transfer Bed To Chair/Chair To Bed Outcome: Progressing Flowsheets (Taken 06/23/2021 1555) Pt will Transfer Bed to Chair/Chair to Bed: with mod assist Goal: Pt Will Ambulate Outcome: Progressing Flowsheets (Taken 06/23/2021 1555) Pt will Ambulate:  10 feet  with moderate assist  with rolling walker   3:56 PM, 06/23/21 Lonell Grandchild, MPT Physical Therapist with [facility] [phone] office [phone] mobile phone

## 2021-06-23 NOTE — TOC Initial Note (Signed)
Transition of Care [facility]) - Initial/Assessment Note    Patient Details  Name: [name] MRN: [id number] Date of Birth: 1941-01-24  [facility] of [facility] CM/SW Contact:    Salome Arnt, [place] Phone Number: 06/23/2021, 11:24 AM  Clinical Narrative:   Pt admitted due to small bowel obstruction. She has been a resident at [facility] since 8/1. Per Melissa at facility, pt is still skilled and will need new authorization. Pt has applied for Medicaid as well. LCSW spoke with pt about d/c plan, but requests that LCSW call [name]. [name] states she is pt's daughter-in-law/HCPOA. She confirms plan to return to [facility] when medically stable. Awaiting PT eval and then TOC will start auth.                 Expected Discharge Plan: Skilled Nursing Facility Barriers to Discharge: Continued Medical Work up   Patient Goals and CMS Choice Patient states their goals for this hospitalization and ongoing recovery are:: return to SNF      Expected Discharge Plan and Services Expected Discharge Plan: [place] In-house Referral: Clinical Social Work   Post Acute Care Choice: [place] Living arrangements for the past 2 months: [place]                 DME Arranged: N/A                    Prior Living Arrangements/Services Living arrangements for the past 2 months: [place] Lives with:: Facility Resident Patient language and need for interpreter reviewed:: Yes Do you feel safe going back to the place where you live?: Yes            Criminal Activity/Legal Involvement Pertinent to Current Situation/Hospitalization: No - Comment as needed  Activities of Daily Living Home Assistive Devices/Equipment: Wheelchair ADL Screening (condition at time of admission) Patient's cognitive ability adequate to safely complete daily activities?: Yes Is the patient deaf or have difficulty hearing?: No Does the  patient have difficulty seeing, even when wearing glasses/contacts?: No Does the patient have difficulty concentrating, remembering, or making decisions?: Yes Patient able to express need for assistance with ADLs?: Yes Does the patient have difficulty dressing or bathing?: Yes Independently performs ADLs?: No Communication: Independent Dressing (OT): Needs assistance Is this a change from baseline?: Pre-admission baseline Grooming: Needs assistance Is this a change from baseline?: Pre-admission baseline Feeding: Independent Bathing: Needs assistance Is this a change from baseline?: Pre-admission baseline Toileting: Needs assistance Is this a change from baseline?: Pre-admission baseline In/Out Bed: Needs assistance Is this a change from baseline?: Pre-admission baseline Walks in Home: Needs assistance Is this a change from baseline?: Pre-admission baseline Does the patient have difficulty walking or climbing stairs?: Yes Weakness of Legs: Both Weakness of Arms/Hands: None  Permission Sought/Granted                  Emotional Assessment         Alcohol / Substance Use: Not Applicable Psych Involvement: No (comment)  Admission diagnosis:  Small bowel obstruction (HCC) [K56.609] Intractable nausea and vomiting [R11.2] Patient Active Problem List   Diagnosis Date Noted   Abdominal pain 06/21/2021   Hypercalcemia 06/21/2021   Serum total bilirubin elevated 06/21/2021   History of schizophrenia 06/21/2021   SBO (small bowel obstruction) ([place]) 06/21/2021   Generalized weakness 05/10/2021   Elevated MCV 05/10/2021   Moderate single current episode of major depressive disorder ([place]) 03/12/2021  Incontinence of urine in female 11/21/2020   Skin yeast infection 11/21/2020   Mass of left side of neck 09/12/2020   Colostomy in place [facility]) 09/12/2020   Housing problems 06/06/2020   Healthcare maintenance 06/06/2020   Abdominal wall bulge 04/22/2020   Acute renal failure  superimposed on stage 3b chronic kidney disease ([place])    History of colonic polyps 07/06/2019   History of partial colectomy 07/06/2019   Seasonal allergies 03/27/2019   Dysuria 03/27/2019   Obesity (BMI 30-39.9) 11/12/2018   Constipation due to opioid therapy 11/12/2018   Lactic acidosis 11/12/2018   Large bowel perforation ([place]) 11/12/2018   Peritonitis ([place]) 11/12/2018   Stercoral ulcer of large intestine 11/12/2018   Pneumoperitoneum    COPD (chronic obstructive pulmonary disease) ([place]) 12/17/2017   Hypothyroidism 12/17/2017   Steroid-induced psychosis, with hallucinations ([place])    UTI (urinary tract infection) 03/03/2017   Nausea & vomiting 03/03/2017   Weight loss 03/03/2017   CAP (community acquired pneumonia) 07/22/2016   CKD (chronic kidney disease) stage 3, GFR 30-59 ml/min (HCC) 05/04/2016   Elevated LFTs 05/04/2016   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   Breath shortness 06/19/2014   Syncope 05/31/2014   Rib fracture 05/31/2014   Weight gain 03/21/2014   Cervical spondylosis 08/15/2013   Neck pain on left side 05/24/2013   Unspecified vitamin D deficiency 12/14/2012   Vitamin D toxicity 12/14/2012   Hemorrhoid 09/01/2012   Myalgia 02/25/2012   Essential hypertension 11/25/2011   Cramps, muscle, general 08/18/2011   RECTAL BLEEDING 11/03/2010   Esophageal dysphagia 11/03/2010   FATIGUE 08/29/2010   MUSCLE PAIN 06/04/2010   Cystitis 05/12/2010   RENAL INSUFFICIENCY 12/18/2008   DEPRESSION 09/04/2008   IRRITABLE BOWEL SYNDROME 05/22/2008   HERNIATED CERVICAL DISC 10/05/2007   HERNIATED LUMBAR DISC 10/05/2007   History of urinary tract infection 09/20/2007   HIV-1 associated autonomic neuropathy ([place]) 03/16/2007   ELBOW PAIN 03/16/2007   ISCHEMIC COLITIS 12/27/2006   Acute kidney injury superimposed on CKD ([place]) 12/27/2006   HIV disease ([place]) 08/27/2006   PCP:  Wannetta Sender, FNP Pharmacy:  No Pharmacies Listed    Social Determinants of Health  (SDOH) Interventions    Readmission Risk Interventions Readmission Risk Prevention Plan 06/23/2021 05/11/2021  Transportation Screening Complete Complete  PCP or Specialist Appt within 3-5 Days - Complete  HRI or Home Care Consult Complete Complete  Social Work Consult for [place] Planning/Counseling Complete Complete  Palliative Care Screening Not Applicable Not Applicable  Medication Review [employer]) Complete Complete  Some recent data might be hidden

## 2021-06-23 NOTE — Progress Notes (Signed)
Patient Demographics:    [name], is a 80 y.o. adult, DOB - 1940/11/07, DEY:[id number]  Admit date - 06/20/2021   Admitting Physician Bernadette Hoit, DO  Outpatient Primary MD for the patient is Wannetta Sender, FNP  LOS - 3   Chief Complaint  Patient presents with   Emesis        Subjective:    [name] today has no fevers, no further emesis,  No chest pain,   -Tolerating NG tube well -No flatus and  no BM -Had recurrent episodes of hypoglycemia overnight -  Assessment  & Plan :    Principal Problem:   SBO (small bowel obstruction) ([place]) Active Problems:   Acute kidney injury superimposed on CKD ([facility])   HIV disease ([place])   Essential hypertension   Nausea & vomiting   Elevated MCV   Abdominal pain   Hypercalcemia   Serum total bilirubin elevated   History of schizophrenia  Brief Summary :- 80 y.o. adult with medical history significant for  peripheral neuropathy, DDD, depression, COPD, CKD stage IIIB, hypertension, HIV infection admitted on 06/20/2021 with persistent emesis and found to have proximal SBO  A/p 1)-Proximal SBO--- continue NG tube to intermittent suction -NG continues to have significant output -No further emesis since placing the NG tube, no flatus -No BM, no fevers -General surgery consult appreciated -Serial clinical exam and serial imaging -Per general surgeon may attempt to clamp NG tube on 06/24/2021 if NG tube output diminishes  2)Constipation/obstipation--- improved with Dulcolax suppository and enema and as ordered -Will need to be on a bowel regimen prior to discharge home  3) urinary symptoms--- UA suggestive of UTI  --treat empirically with IV Rocephin pending culture data  4)AKI----acute kidney injury on CKD stage -3B -Creatinine is currently down to 1.29 from 1.80 on admission Baseline creatinine usually around 1.3 -Continue IV  fluids -renally adjust medications, avoid nephrotoxic agents / dehydration  / hypotension  5)History of HIV disease Patient follows with Dr. [name] Continue Triumeq when patient resumes oral intake  6)GERD--continue Protonix  7)HTN--- BP stable, use IV labetalol as needed elevated BP  8)FEN/Hypokalemia --- recurrent episodes of hypoglycemia while n.p.o. -D5 solution started -Hypokalemia is most likely due to lack of oral intake and GI losses-replace and recheck  9)Generalized weakness and deconditioning----PT eval appreciated recommends SNF rehab  Disposition/Need for in-Hospital Stay- patient unable to be discharged at this time due to -small bowel obstruction requiring NG tube and IV fluids pending return of bowel function and tolerance of oral intake*  Status is: Inpatient  Remains inpatient appropriate because: Please see disposition above  Disposition: The patient is from: Home              Anticipated d/c is to: SNF              Anticipated d/c date is: > 3 days              Patient currently is not medically stable to d/c. Barriers: Not Clinically Stable-   Code Status : -  Code Status: Full Code   Family Communication:   NA (patient is alert, awake and coherent)   Consults  :  Gen Surg  DVT Prophylaxis  :   -  SCDs  heparin injection 5,000 Units Start: 06/21/21 2200 SCDs Start: 06/21/21 0122    Lab Results  Component Value Date   PLT 169 06/22/2021    Inpatient Medications  Scheduled Meds:  dextrose       heparin injection (subcutaneous)  5,000 Units Subcutaneous Q8H   pantoprazole (PROTONIX) IV  40 mg Intravenous Q24H   Continuous Infusions:  cefTRIAXone (ROCEPHIN)  IV 1 g (06/23/21 1819)   dextrose 5 % and 0.45 % NaCl with KCl 20 mEq/L 75 mL/hr at 06/23/21 1633   PRN Meds:.acetaminophen, fentaNYL (SUBLIMAZE) injection, labetalol, LORazepam, ondansetron (ZOFRAN) IV, sodium phosphate    Anti-infectives (From admission, onward)    Start      Dose/Rate Route Frequency Ordered Stop   06/21/21 1800  cefTRIAXone (ROCEPHIN) 1 g in sodium chloride 0.9 % 100 mL IVPB        1 g 200 mL/hr over 30 Minutes Intravenous Every 24 hours 06/21/21 1700           Objective:   Vitals:   06/22/21 0613 06/22/21 1451 06/22/21 2130 06/23/21 0602  BP: (!) 128/57 (!) 126/58 130/60 (!) 143/61  Pulse: 72 72 67 67  Resp: 19 19 19 18   Temp: 98.4 F (36.9 C) 98.6 F (37 C) 99.1 F (37.3 C) 98.3 F (36.8 C)  TempSrc: Oral Oral Oral Oral  SpO2: 96% 96% 100% 98%  Weight:      Height:        Wt Readings from Last 3 Encounters:  06/21/21 72.3 kg  05/11/21 70.8 kg  12/23/20 74.4 kg     Intake/Output Summary (Last 24 hours) at 06/23/2021 1827 Last data filed at 06/23/2021 1500 Gross per 24 hour  Intake 1283.76 ml  Output 1350 ml  Net -66.24 ml    Physical Exam  Gen:- Awake Alert,  in no apparent distress  HEENT:- .AT, No sclera icterus Nose- NGT to intermittent wall suction Neck-Supple Neck,No JVD,.  Lungs-  CTAB , fair symmetrical air movement CV- S1, S2 normal, regular  Abd-improving bowel sounds, healed scars, no significant abdominal discomfort at this time no CVA area tenderness Extremity/Skin:- No  edema, pedal pulses present  Psych-affect is appropriate, oriented x3 Neuro-generalized weakness, no new focal deficits, no tremors   Data Review:   Micro Results Recent Results (from the past 240 hour(s))  Resp Panel by RT-PCR (Flu A&B, Covid) Nasopharyngeal Swab     Status: None   Collection Time: 06/20/21 11:00 PM   Specimen: Nasopharyngeal Swab; Nasopharyngeal(NP) swabs in vial transport medium  Result Value Ref Range Status   SARS Coronavirus 2 by RT PCR NEGATIVE NEGATIVE Final    Comment: (NOTE) SARS-CoV-2 target nucleic acids are NOT DETECTED.  The SARS-CoV-2 RNA is generally detectable in upper respiratory specimens during the acute phase of infection. The lowest concentration of SARS-CoV-2 viral copies this assay  can detect is 138 copies/mL. A negative result does not preclude SARS-Cov-2 infection and should not be used as the sole basis for treatment or other patient management decisions. A negative result may occur with  improper specimen collection/handling, submission of specimen other than nasopharyngeal swab, presence of viral mutation(s) within the areas targeted by this assay, and inadequate number of viral copies(<138 copies/mL). A negative result must be combined with clinical observations, patient history, and epidemiological information. The expected result is Negative.  Fact Sheet for Patients:  EntrepreneurPulse.com.au  Fact Sheet for Healthcare Providers:  IncredibleEmployment.be  This test is no t yet approved or cleared  by the [place] and  has been authorized for detection and/or diagnosis of SARS-CoV-2 by FDA under an Emergency Use Authorization (EUA). This EUA will remain  in effect (meaning this test can be used) for the duration of the COVID-19 declaration under Section 564(b)(1) of the Act, 21 U.S.C.section 360bbb-3(b)(1), unless the authorization is terminated  or revoked sooner.       Influenza A by PCR NEGATIVE NEGATIVE Final   Influenza B by PCR NEGATIVE NEGATIVE Final    Comment: (NOTE) The Xpert Xpress SARS-CoV-2/FLU/RSV plus assay is intended as an aid in the diagnosis of influenza from Nasopharyngeal swab specimens and should not be used as a sole basis for treatment. Nasal washings and aspirates are unacceptable for Xpert Xpress SARS-CoV-2/FLU/RSV testing.  Fact Sheet for Patients: EntrepreneurPulse.com.au  Fact Sheet for Healthcare Providers: IncredibleEmployment.be  This test is not yet approved or cleared by the [place] FDA and has been authorized for detection and/or diagnosis of SARS-CoV-2 by FDA under an Emergency Use Authorization (EUA). This EUA will  remain in effect (meaning this test can be used) for the duration of the COVID-19 declaration under Section 564(b)(1) of the Act, 21 U.S.C. section 360bbb-3(b)(1), unless the authorization is terminated or revoked.  Performed at [facility], [address], [place], [place] [address]   MRSA Next Gen by PCR, Nasal     Status: None   Collection Time: 06/21/21  2:13 AM   Specimen: Nasal Mucosa; Nasal Swab  Result Value Ref Range Status   MRSA by PCR Next Gen NOT DETECTED NOT DETECTED Final    Comment: (NOTE) The GeneXpert MRSA Assay (FDA approved for NASAL specimens only), is one component of a comprehensive MRSA colonization surveillance program. It is not intended to diagnose MRSA infection nor to guide or monitor treatment for MRSA infections. Test performance is not FDA approved in patients less than 62 years old. Performed at [facility], [address] [place],  [place] [address]     Radiology Reports CT ABDOMEN PELVIS WO CONTRAST  Result Date: 06/20/2021 CLINICAL DATA:  Abdomen pain vomiting EXAM: CT ABDOMEN AND PELVIS WITHOUT CONTRAST TECHNIQUE: Multidetector CT imaging of the abdomen and pelvis was performed following the standard protocol without IV contrast. COMPARISON:  CT 05/28/2020, 11/12/2018 FINDINGS: Lower chest: Lung bases demonstrate no acute consolidation or effusion. Atelectasis or scarring at the bases. Coronary vascular calcification. Hepatobiliary: No focal liver abnormality is seen. Status post cholecystectomy. No biliary dilatation. Pancreas: Unremarkable. No pancreatic ductal dilatation or surrounding inflammatory changes. Spleen: Normal in size without focal abnormality. Adrenals/Urinary Tract: Right adrenal gland is normal. 19 mm left adrenal nodule, stable consistent with adenoma. Kidneys show no hydronephrosis. The bladder is normal Stomach/Bowel: Moderate fluid distension of the stomach. Dilated fluid-filled duodenum and proximal jejunum. Slightly swirled  appearance of mesentery and bowel within the right mid abdomen, series 2, image 53 with relatively decompressed remainder of the distal small bowel. No acute bowel wall thickening. Large volume of stool in the colon and rectum. Interval takedown of left abdominal colostomy with reanastomosis at the sigmoid colon. Possible mild: Narrowing slightly distal to the anastomosis, series 2, image 62 and coronal series 5 image 42. Large stool in the rectum distal to this. Vascular/Lymphatic: Moderate aortic atherosclerosis. No aneurysm. No suspicious nodes Reproductive: Status post hysterectomy. No adnexal masses. Other: No free air. No pelvic effusion. Mild presacral soft tissue stranding. Mild nonspecific soft tissue stranding in left colic gutter. Interval repair of previously noted ventral hernias and parastomal  hernias. Small periumbilical region hernia containing fat and small bowel. Musculoskeletal: Degenerative changes. No acute osseous abnormality. Post laminectomy changes L3 through S1. IMPRESSION: 1. Findings suspicious for proximal bowel obstruction with moderate fluid distension of the stomach, duodenum and proximal jejunum with suspected area of transition in the proximal jejunum right mid abdominal region where there is slightly swirled appearance of bowel and mesentery, either due to incomplete volvulus versus hernia versus adhesion. 2. Interval takedown of left abdominal colostomy with reanastomosis at sigmoid colon. Possible caliber change/stricture at the rectosigmoid colon slightly distal to the anastomosis. There is large stool burden throughout the colon with marked rectal distension by feces. There is some perirectal edema and presacral soft tissue stranding. 3. Interval repair of ventral hernias and previously noted parastomal hernia. Small residual ventral hernia containing mesenteric and small bowel. 4. Stable left adrenal adenoma. Electronically Signed   By: Donavan Foil M.D.   On: 06/20/2021  22:43   DG Abd 1 View  Result Date: 06/22/2021 CLINICAL DATA:  Nasogastric tube. EXAM: ABDOMEN - 1 VIEW COMPARISON:  None. FINDINGS: The bowel gas pattern is normal. Distal tip of nasogastric tube is seen in the stomach. Status post cholecystectomy. No radio-opaque calculi or other significant radiographic abnormality are seen. IMPRESSION: Distal tip of nasogastric tube seen in stomach. Electronically Signed   By: Marijo Conception M.D.   On: 06/22/2021 08:09   DG Chest Port 1 View  Result Date: 06/21/2021 CLINICAL DATA:  NG placement. EXAM: PORTABLE CHEST 1 VIEW COMPARISON:  Chest radiograph dated 05/09/2021 and CT dated 05/09/2021. FINDINGS: Enteric tube extends below the diaphragm with side-port in the left upper abdomen and tip beyond the inferior margin of the image, likely in the stomach. Minimal left lung base atelectasis/scarring. No focal consolidation, pleural effusion or pneumothorax. The cardiac silhouette is within limits. Osteopenia with degenerative changes the spine. No acute osseous pathology. IMPRESSION: Enteric tube with tip beyond the inferior margin of the image, likely in the stomach. Electronically Signed   By: Anner Crete M.D.   On: 06/21/2021 02:09   DG Chest Port 1V same Day  Result Date: 06/21/2021 CLINICAL DATA:  Post NG tube Ree insertion. EXAM: PORTABLE CHEST 1 VIEW COMPARISON:  Chest x-ray from earlier same day. FINDINGS: Enteric tube appears appropriately positioned in the stomach. Heart size and mediastinal contours are stable. Lungs are clear. No pleural effusion or pneumothorax is seen. IMPRESSION: Enteric tube appears appropriately positioned in the stomach. Electronically Signed   By: Franki Cabot M.D.   On: 06/21/2021 12:05     CBC Recent Labs  Lab 06/20/21 2102 06/21/21 0449 06/22/21 0653  WBC 10.5 9.3 6.0  HGB 14.1 13.2 11.3*  HCT 40.5 38.6 33.4*  PLT 218 188 169  MCV 105.5* 106.3* 107.4*  MCH 36.7* 36.4* 36.3*  MCHC 34.8 34.2 33.8  RDW 12.5 12.4  12.4  LYMPHSABS 1.9  --   --   MONOABS 0.6  --   --   EOSABS 0.0  --   --   BASOSABS 0.0  --   --     Chemistries  Recent Labs  Lab 06/20/21 2102 06/21/21 0449 06/22/21 0653 06/23/21 0539  NA 134* 139 141 141  K 4.7 3.9 3.9 3.2*  CL 91* 97* 104 107  CO2 34* 29 27 24   GLUCOSE 130* 108* 84 69*  BUN 42* 44* 41* 31*  CREATININE 1.80* 1.72* 1.73* 1.29*  CALCIUM 11.1* 11.0* 9.8 10.0  MG  --  2.4  --   --  AST 30 22  --   --   ALT 22 20  --   --   ALKPHOS 71 71  --   --   BILITOT 1.4* 1.3*  --   --    ------------------------------------------------------------------------------------------------------------------ No results for input(s): CHOL, HDL, LDLCALC, TRIG, CHOLHDL, LDLDIRECT in the last 72 hours.  Lab Results  Component Value Date   HGBA1C 4.9 09/30/2020   ------------------------------------------------------------------------------------------------------------------ No results for input(s): TSH, T4TOTAL, T3FREE, THYROIDAB in the last 72 hours.  Invalid input(s): FREET3 ------------------------------------------------------------------------------------------------------------------ Recent Labs    06/21/21 0449  VITAMINB12 634  FOLATE 29.2    Coagulation profile Recent Labs  Lab 06/21/21 0449  INR 1.1    No results for input(s): DDIMER in the last 72 hours.  Cardiac Enzymes No results for input(s): CKMB, TROPONINI, MYOGLOBIN in the last 168 hours.  Invalid input(s): CK ------------------------------------------------------------------------------------------------------------------    Component Value Date/Time   BNP 184.0 (H) 03/29/2017 1704     Roxan Hockey M.D on 06/23/2021 at 6:27 PM  Go to [email or web address] - for contact info  [facility] - Office  [phone]

## 2021-06-24 ENCOUNTER — Inpatient Hospital Stay (HOSPITAL_COMMUNITY): Payer: Medicare Other

## 2021-06-24 DIAGNOSIS — E876 Hypokalemia: Secondary | ICD-10-CM

## 2021-06-24 LAB — BASIC METABOLIC PANEL
Anion gap: 5 (ref 5–15)
BUN: 17 mg/dL (ref 8–23)
CO2: 25 mmol/L (ref 22–32)
Calcium: 9.6 mg/dL (ref 8.9–10.3)
Chloride: 109 mmol/L (ref 98–111)
Creatinine, Ser: 0.98 mg/dL (ref 0.44–1.00)
GFR, Estimated: 58 mL/min — ABNORMAL LOW (ref >60)
Glucose, Bld: 107 mg/dL — ABNORMAL HIGH (ref 70–99)
Potassium: 3.4 mmol/L — ABNORMAL LOW (ref 3.5–5.1)
Sodium: 139 mmol/L (ref 135–145)

## 2021-06-24 LAB — CBC
HCT: 33.8 % — ABNORMAL LOW (ref 36.0–46.0)
Hemoglobin: 11.7 g/dL — ABNORMAL LOW (ref 12.0–15.0)
MCH: 36.1 pg — ABNORMAL HIGH (ref 26.0–34.0)
MCHC: 34.6 g/dL (ref 30.0–36.0)
MCV: 104.3 fL — ABNORMAL HIGH (ref 80.0–100.0)
Platelets: 164 10*3/uL (ref 150–400)
RBC: 3.24 MIL/uL — ABNORMAL LOW (ref 3.87–5.11)
RDW: 12.2 % (ref 11.5–15.5)
WBC: 6.6 10*3/uL (ref 4.0–10.5)
nRBC: 0 % (ref 0.0–0.2)

## 2021-06-24 LAB — GLUCOSE, CAPILLARY
Glucose-Capillary: 120 mg/dL — ABNORMAL HIGH (ref 70–99)
Glucose-Capillary: 82 mg/dL (ref 70–99)

## 2021-06-24 LAB — MAGNESIUM: Magnesium: 2 mg/dL (ref 1.7–2.4)

## 2021-06-24 MED ORDER — POTASSIUM CHLORIDE 10 MEQ/100ML IV SOLN
10.0000 meq | INTRAVENOUS | Status: AC
  Administered 2021-06-24 (×2): 10 meq via INTRAVENOUS
  Filled 2021-06-24 (×2): qty 100

## 2021-06-24 NOTE — Progress Notes (Signed)
  Subjective: Pt notes that she had an additional bowel movement yesterday afternoon. Denies nausea or vomiting or abdominal pain. Experiencing discomfort with NG tube and notes she gagged on it last night.   Objective: Vital signs in last 24 hours: Temp:  [98.4 F (36.9 C)-99.4 F (37.4 C)] 99.4 F (37.4 C) (08/30 0618) Pulse Rate:  [61-68] 68 (08/30 0618) Resp:  [18] 18 (08/30 0618) BP: (137-145)/(68-80) 137/80 (08/30 0618) SpO2:  [98 %-100 %] 98 % (08/30 0618) Last BM Date: 06/23/21  Intake/Output from previous day: 08/29 0701 - 08/30 0700 In: 257.2 [IV Piggyback:257.2] Out: -  Intake/Output this shift: No intake/output data recorded.  General appearance: alert, cooperative, and no distress Resp: clear to auscultation bilaterally Cardio: regular rate and rhythm, S1, S2 normal, no murmur, click, rub or gallop GI: soft, non-tender; bowel sounds normal; no masses,  no organomegaly  Lab Results:  Recent Labs    06/22/21 0653 06/24/21 0537  WBC 6.0 6.6  HGB 11.3* 11.7*  HCT 33.4* 33.8*  PLT 169 164   BMET Recent Labs    06/23/21 0539 06/24/21 0537  NA 141 139  K 3.2* 3.4*  CL 107 109  CO2 24 25  GLUCOSE 69* 107*  BUN 31* 17  CREATININE 1.29* 0.98  CALCIUM 10.0 9.6   PT/INR No results for input(s): LABPROT, INR in the last 72 hours.  Studies/Results: No results found.  Anti-infectives: Anti-infectives (From admission, onward)    Start     Dose/Rate Route Frequency Ordered Stop   06/21/21 1800  cefTRIAXone (ROCEPHIN) 1 g in sodium chloride 0.9 % 100 mL IVPB        1 g 200 mL/hr over 30 Minutes Intravenous Every 24 hours 06/21/21 1700         Assessment/Plan: Impression: Hospital Day 4 for this 79 y/o woman with 8/26 CT-demonstrated proximal small bowel obstruction.  Has had two bowel movements over last two days. NG decompression with about 741mL since pt was seen yesterday, and appears to not be in correct position on today's XR.  Plan: Remove  NG tube and will begin liquid challenge   LOS: 4 days    Reiley Bertagnolli A Palyn Scrima 06/24/2021

## 2021-06-24 NOTE — Progress Notes (Signed)
Pt was noted to have suction sounds when she opened her mouth this morning. This RN noticed that pt's NGT had come out slightly. This RN advanced slightly, then when getting ready to auscultate air, Radiology came into room for the pre-ordered ADB/Chest X-Rays. This RN was able to see that NGT was still not advanced into pt's stomach. Once X-Rays were done, this RN advanced tubing again and was able to auscultate air instillation with syringe. Pt no longer has the suction sounds coming from her mouth.

## 2021-06-24 NOTE — Progress Notes (Signed)
Patient Demographics:    [name], is a 80 y.o. adult, DOB - [birth year], TDV:[id number]  Admit date - 06/20/2021   Admitting Physician Bernadette Hoit, DO  Outpatient Primary MD for the patient is Wannetta Sender, FNP  LOS - 4   Chief Complaint  Patient presents with   Emesis        Subjective:    [name] no fever, no chest pain, no nausea or vomiting currently.  NG tube has been removed and clear liquid diet has been advanced.  So far well-tolerated.  Denies abdominal pain.   Assessment  & Plan :    Principal Problem:   SBO (small bowel obstruction) (HCC) Active Problems:   HIV disease ([place])   Acute kidney injury superimposed on CKD (HCC)   Essential hypertension   Nausea & vomiting   Elevated MCV   Abdominal pain   Hypercalcemia   Serum total bilirubin elevated   History of schizophrenia  Brief Summary :- 80 y.o. adult with medical history significant for  peripheral neuropathy, DDD, depression, COPD, CKD stage IIIB, hypertension, HIV infection admitted on 06/20/2021 with persistent emesis and found to have proximal SBO  A/p 1)-Proximal SBO--- continue NG tube to intermittent suction -Decreased breath sounds at the bases appreciated; patient denies abdominal pain -KUB this morning showing NG tube not in the appropriate position; partial SBO resolving -Following general surgery recommendation NG tube has been removed and clear liquid diet has been started. -Follow clinical response, maintain adequate hydration and replete electrolytes as needed.  2)Constipation/obstipation--- improved with Dulcolax suppository and enema as ordered -Will need to be on a bowel regimen prior to discharge home -Maintain adequate hydration.  3) urinary symptoms--- UA suggestive of UTI and culture demonstrating more than 100,000 colonies of Proteus mirabilis more than 60,000 colonies of  Pseudomonas (sensitivity pending). -Continue treat empirically with IV Rocephin pending final culture results.  4)AKI----acute kidney injury on CKD stage -3B -Creatinine is currently down to 0.98 from 1.80 on admission  -Baseline creatinine usually around 1.3 -Continue IV fluids and maintain adequate hydraiton. -renally adjust medications, avoid nephrotoxic agents / dehydration  / hypotension  5)History of HIV disease -Patient follows with Dr. [name] -Continue Triumeq when patient resumes oral intake  6)GERD- -continue Protonix  7)HTN--- BP stable overall -continue to use IV labetalol as needed elevated BP  8)FEN/Hypokalemia ---  -recurrent episodes of hypoglycemia while n.p.o. -D5 and K solution started and will be continued for now. -follow electrolyte strend  9)Generalized weakness and deconditioning----PT eval appreciated recommends SNF rehab -[facility] aware and will help with assistance once medically stable.  Disposition/Need for in-Hospital Stay- patient unable to be discharged at this time due to -small bowel obstruction requiring NG tube and IV fluids pending return of bowel function and tolerance of oral intake*  Status is: Inpatient  Remains inpatient appropriate because: Please see disposition above  Disposition: The patient is from: Home              Anticipated d/c is to: SNF              Anticipated d/c date is: >2 days              Patient currently is not medically  stable to d/c.  Barriers: Not Clinically Stable-    Code Status : -  Code Status: Full Code   Family Communication:   No family at bedside; (patient is alert, awake and coherent)   Consults  :  Gen Surg  DVT Prophylaxis  :   - SCDs  heparin injection 5,000 Units Start: 06/21/21 2200 SCDs Start: 06/21/21 0122    Lab Results  Component Value Date   PLT 164 06/24/2021    Inpatient Medications  Scheduled Meds:  heparin injection (subcutaneous)  5,000 Units Subcutaneous Q8H    pantoprazole (PROTONIX) IV  40 mg Intravenous Q24H   Continuous Infusions:  cefTRIAXone (ROCEPHIN)  IV 1 g (06/23/21 1819)   dextrose 5 % and 0.45 % NaCl with KCl 20 mEq/L 75 mL/hr at 06/23/21 1633   PRN Meds:.acetaminophen, fentaNYL (SUBLIMAZE) injection, labetalol, LORazepam, ondansetron (ZOFRAN) IV, sodium phosphate   Anti-infectives (From admission, onward)    Start     Dose/Rate Route Frequency Ordered Stop   06/21/21 1800  cefTRIAXone (ROCEPHIN) 1 g in sodium chloride 0.9 % 100 mL IVPB        1 g 200 mL/hr over 30 Minutes Intravenous Every 24 hours 06/21/21 1700           Objective:   Vitals:   06/23/21 0602 06/23/21 2140 06/24/21 0618 06/24/21 1434  BP: (!) 143/61 (!) 145/68 137/80 (!) 155/72  Pulse: 67 61 68 64  Resp: 18 18 18 15   Temp: 98.3 F (36.8 C) 98.4 F (36.9 C) 99.4 F (37.4 C) 97.8 F (36.6 C)  TempSrc: Oral  Oral Oral  SpO2: 98% 100% 98% 100%  Weight:      Height:        Wt Readings from Last 3 Encounters:  06/21/21 72.3 kg  05/11/21 70.8 kg  12/23/20 74.4 kg     Intake/Output Summary (Last 24 hours) at 06/24/2021 1834 Last data filed at 06/24/2021 1814 Gross per 24 hour  Intake 2494.28 ml  Output 2300 ml  Net 194.28 ml    Physical Exam General exam: Alert, awake, oriented x 3; expressing feeling better; no nausea, no vomiting, no abdominal pain.  NG tube has been removed and clear liquid diet initiated. Respiratory system: Clear to auscultation. Respiratory effort normal.  No using accessory muscle.  Good saturation on room air. Cardiovascular system:RRR. No murmurs, rubs, gallops.  No JVD Gastrointestinal system: Abdomen is nondistended, soft and nontender. No organomegaly or masses felt.  Decreased but present bowel sounds on auscultation.   Central nervous system: Alert and oriented. No focal neurological deficits. Extremities: No cyanosis or clubbing. Skin: No petechiae. Psychiatry: Judgement and insight appear normal. Mood & affect  appropriate.     Data Review:   Micro Results Recent Results (from the past 240 hour(s))  Resp Panel by RT-PCR (Flu A&B, Covid) Nasopharyngeal Swab     Status: None   Collection Time: 06/20/21 11:00 PM   Specimen: Nasopharyngeal Swab; Nasopharyngeal(NP) swabs in vial transport medium  Result Value Ref Range Status   SARS Coronavirus 2 by RT PCR NEGATIVE NEGATIVE Final    Comment: (NOTE) SARS-CoV-2 target nucleic acids are NOT DETECTED.  The SARS-CoV-2 RNA is generally detectable in upper respiratory specimens during the acute phase of infection. The lowest concentration of SARS-CoV-2 viral copies this assay can detect is 138 copies/mL. A negative result does not preclude SARS-Cov-2 infection and should not be used as the sole basis for treatment or other patient management decisions. A negative  result may occur with  improper specimen collection/handling, submission of specimen other than nasopharyngeal swab, presence of viral mutation(s) within the areas targeted by this assay, and inadequate number of viral copies(<138 copies/mL). A negative result must be combined with clinical observations, patient history, and epidemiological information. The expected result is Negative.  Fact Sheet for Patients:  EntrepreneurPulse.com.au  Fact Sheet for Healthcare Providers:  IncredibleEmployment.be  This test is no t yet approved or cleared by the [place] FDA and  has been authorized for detection and/or diagnosis of SARS-CoV-2 by FDA under an Emergency Use Authorization (EUA). This EUA will remain  in effect (meaning this test can be used) for the duration of the COVID-19 declaration under Section 564(b)(1) of the Act, 21 U.S.C.section 360bbb-3(b)(1), unless the authorization is terminated  or revoked sooner.       Influenza A by PCR NEGATIVE NEGATIVE Final   Influenza B by PCR NEGATIVE NEGATIVE Final    Comment: (NOTE) The Xpert Xpress  SARS-CoV-2/FLU/RSV plus assay is intended as an aid in the diagnosis of influenza from Nasopharyngeal swab specimens and should not be used as a sole basis for treatment. Nasal washings and aspirates are unacceptable for Xpert Xpress SARS-CoV-2/FLU/RSV testing.  Fact Sheet for Patients: EntrepreneurPulse.com.au  Fact Sheet for Healthcare Providers: IncredibleEmployment.be  This test is not yet approved or cleared by the [place] FDA and has been authorized for detection and/or diagnosis of SARS-CoV-2 by FDA under an Emergency Use Authorization (EUA). This EUA will remain in effect (meaning this test can be used) for the duration of the COVID-19 declaration under Section 564(b)(1) of the Act, 21 U.S.C. section 360bbb-3(b)(1), unless the authorization is terminated or revoked.  Performed at [facility], [address] [place], [place] [address]   MRSA Next Gen by PCR, Nasal     Status: None   Collection Time: 06/21/21  2:13 AM   Specimen: Nasal Mucosa; Nasal Swab  Result Value Ref Range Status   MRSA by PCR Next Gen NOT DETECTED NOT DETECTED Final    Comment: (NOTE) The GeneXpert MRSA Assay (FDA approved for NASAL specimens only), is one component of a comprehensive MRSA colonization surveillance program. It is not intended to diagnose MRSA infection nor to guide or monitor treatment for MRSA infections. Test performance is not FDA approved in patients less than 45 years old. Performed at [facility], [address] [place], [place] [address]   Urine Culture     Status: Abnormal (Preliminary result)   Collection Time: 06/22/21  6:00 AM   Specimen: Urine, Clean Catch  Result Value Ref Range Status   Specimen Description   Final    URINE, CLEAN CATCH Performed at [facility], [address], [place], [place] [address]    Special Requests   Final    NONE Performed at [facility], [address] [place], [place] [address]    Culture  (A)  Final    >=100,000 COLONIES/mL PROTEUS MIRABILIS 60,000 COLONIES/mL PSEUDOMONAS AERUGINOSA    Report Status PENDING  Incomplete    Radiology Reports CT ABDOMEN PELVIS WO CONTRAST  Result Date: 06/20/2021 CLINICAL DATA:  Abdomen pain vomiting EXAM: CT ABDOMEN AND PELVIS WITHOUT CONTRAST TECHNIQUE: Multidetector CT imaging of the abdomen and pelvis was performed following the standard protocol without IV contrast. COMPARISON:  CT 05/28/2020, 11/12/2018 FINDINGS: Lower chest: Lung bases demonstrate no acute consolidation or effusion. Atelectasis or scarring at the bases. Coronary vascular calcification. Hepatobiliary: No focal liver abnormality is seen. Status post  cholecystectomy. No biliary dilatation. Pancreas: Unremarkable. No pancreatic ductal dilatation or surrounding inflammatory changes. Spleen: Normal in size without focal abnormality. Adrenals/Urinary Tract: Right adrenal gland is normal. 19 mm left adrenal nodule, stable consistent with adenoma. Kidneys show no hydronephrosis. The bladder is normal Stomach/Bowel: Moderate fluid distension of the stomach. Dilated fluid-filled duodenum and proximal jejunum. Slightly swirled appearance of mesentery and bowel within the right mid abdomen, series 2, image 53 with relatively decompressed remainder of the distal small bowel. No acute bowel wall thickening. Large volume of stool in the colon and rectum. Interval takedown of left abdominal colostomy with reanastomosis at the sigmoid colon. Possible mild: Narrowing slightly distal to the anastomosis, series 2, image 62 and coronal series 5 image 42. Large stool in the rectum distal to this. Vascular/Lymphatic: Moderate aortic atherosclerosis. No aneurysm. No suspicious nodes Reproductive: Status post hysterectomy. No adnexal masses. Other: No free air. No pelvic effusion. Mild presacral soft tissue stranding. Mild nonspecific soft tissue stranding in left colic gutter. Interval repair of previously  noted ventral hernias and parastomal hernias. Small periumbilical region hernia containing fat and small bowel. Musculoskeletal: Degenerative changes. No acute osseous abnormality. Post laminectomy changes L3 through S1. IMPRESSION: 1. Findings suspicious for proximal bowel obstruction with moderate fluid distension of the stomach, duodenum and proximal jejunum with suspected area of transition in the proximal jejunum right mid abdominal region where there is slightly swirled appearance of bowel and mesentery, either due to incomplete volvulus versus hernia versus adhesion. 2. Interval takedown of left abdominal colostomy with reanastomosis at sigmoid colon. Possible caliber change/stricture at the rectosigmoid colon slightly distal to the anastomosis. There is large stool burden throughout the colon with marked rectal distension by feces. There is some perirectal edema and presacral soft tissue stranding. 3. Interval repair of ventral hernias and previously noted parastomal hernia. Small residual ventral hernia containing mesenteric and small bowel. 4. Stable left adrenal adenoma. Electronically Signed   By: Donavan Foil M.D.   On: 06/20/2021 22:43   DG Abd 1 View  Result Date: 06/22/2021 CLINICAL DATA:  Nasogastric tube. EXAM: ABDOMEN - 1 VIEW COMPARISON:  None. FINDINGS: The bowel gas pattern is normal. Distal tip of nasogastric tube is seen in the stomach. Status post cholecystectomy. No radio-opaque calculi or other significant radiographic abnormality are seen. IMPRESSION: Distal tip of nasogastric tube seen in stomach. Electronically Signed   By: Marijo Conception M.D.   On: 06/22/2021 08:09   DG Chest Port 1 View  Result Date: 06/21/2021 CLINICAL DATA:  NG placement. EXAM: PORTABLE CHEST 1 VIEW COMPARISON:  Chest radiograph dated 05/09/2021 and CT dated 05/09/2021. FINDINGS: Enteric tube extends below the diaphragm with side-port in the left upper abdomen and tip beyond the inferior margin of the  image, likely in the stomach. Minimal left lung base atelectasis/scarring. No focal consolidation, pleural effusion or pneumothorax. The cardiac silhouette is within limits. Osteopenia with degenerative changes the spine. No acute osseous pathology. IMPRESSION: Enteric tube with tip beyond the inferior margin of the image, likely in the stomach. Electronically Signed   By: Anner Crete M.D.   On: 06/21/2021 02:09   DG Chest Port 1V same Day  Result Date: 06/21/2021 CLINICAL DATA:  Post NG tube Ree insertion. EXAM: PORTABLE CHEST 1 VIEW COMPARISON:  Chest x-ray from earlier same day. FINDINGS: Enteric tube appears appropriately positioned in the stomach. Heart size and mediastinal contours are stable. Lungs are clear. No pleural effusion or pneumothorax is seen. IMPRESSION: Enteric tube appears  appropriately positioned in the stomach. Electronically Signed   By: Franki Cabot M.D.   On: 06/21/2021 12:05   DG ABD ACUTE 2+V W 1V CHEST  Result Date: 06/24/2021 CLINICAL DATA:  Small-bowel obstruction, NG tube repositioning EXAM: DG ABDOMEN ACUTE WITH 1 VIEW CHEST COMPARISON:  August 28 FINDINGS: Interval retraction of the nasogastric tube with tip overlying the midthoracic esophagus. The cardiomediastinal silhouette is within normal limits. No pleural effusion or pneumothorax. No focal consolidative process in the lungs. The abdomen demonstrates prominent large bowel gas with the cecum measuring at the upper limit of normal. Surgical clips in the right upper quadrant. No acute osseous abnormality. IMPRESSION: Interval retraction of the enteric tube with tip overlying the midthoracic esophagus. These results will be called to the ordering clinician or representative by the Radiologist Assistant, and communication documented in the PACS or [employer]. Electronically Signed   By: Albin Felling M.D.   On: 06/24/2021 09:35     CBC Recent Labs  Lab 06/20/21 2102 06/21/21 0449 06/22/21 0653  06/24/21 0537  WBC 10.5 9.3 6.0 6.6  HGB 14.1 13.2 11.3* 11.7*  HCT 40.5 38.6 33.4* 33.8*  PLT 218 188 169 164  MCV 105.5* 106.3* 107.4* 104.3*  MCH 36.7* 36.4* 36.3* 36.1*  MCHC 34.8 34.2 33.8 34.6  RDW 12.5 12.4 12.4 12.2  LYMPHSABS 1.9  --   --   --   MONOABS 0.6  --   --   --   EOSABS 0.0  --   --   --   BASOSABS 0.0  --   --   --     Chemistries  Recent Labs  Lab 06/20/21 2102 06/21/21 0449 06/22/21 0653 06/23/21 0539 06/24/21 0537  NA 134* 139 141 141 139  K 4.7 3.9 3.9 3.2* 3.4*  CL 91* 97* 104 107 109  CO2 34* 29 27 24 25   GLUCOSE 130* 108* 84 69* 107*  BUN 42* 44* 41* 31* 17  CREATININE 1.80* 1.72* 1.73* 1.29* 0.98  CALCIUM 11.1* 11.0* 9.8 10.0 9.6  MG  --  2.4  --   --  2.0  AST 30 22  --   --   --   ALT 22 20  --   --   --   ALKPHOS 71 71  --   --   --   BILITOT 1.4* 1.3*  --   --   --     Lab Results  Component Value Date   HGBA1C 4.9 09/30/2020    Coagulation profile Recent Labs  Lab 06/21/21 0449  INR 1.1     ------------------------------------------------------------------------------------------------------------------    Component Value Date/Time   BNP 184.0 (H) 03/29/2017 1704     Barton Dubois M.D on 06/24/2021 at 6:34 PM  Go to [email or web address] - for contact info  [facility] - Office  [phone]

## 2021-06-25 LAB — GLUCOSE, CAPILLARY
Glucose-Capillary: 115 mg/dL — ABNORMAL HIGH (ref 70–99)
Glucose-Capillary: 121 mg/dL — ABNORMAL HIGH (ref 70–99)
Glucose-Capillary: 114 mg/dL — ABNORMAL HIGH (ref 70–99)
Glucose-Capillary: 111 mg/dL — ABNORMAL HIGH (ref 70–99)

## 2021-06-25 MED ORDER — CLONAZEPAM 0.5 MG PO TABS: 1.0000 mg | ORAL_TABLET | Freq: Two times a day (BID) | ORAL | Status: DC | PRN

## 2021-06-25 MED ORDER — ABACAVIR-DOLUTEGRAVIR-LAMIVUD 600-50-300 MG PO TABS
1.0000 | ORAL_TABLET | Freq: Every day | ORAL | Status: DC
  Administered 2021-06-25 – 2021-06-26 (×2): 1 via ORAL
  Filled 2021-06-25 (×3): qty 1

## 2021-06-25 NOTE — Progress Notes (Signed)
Physical Therapy Treatment Patient Details Name: [name] MRN: [id number] DOB: [birth year] Today's Date: 06/25/2021    History of Present Illness [name] is an 80 y.o. adult with medical history significant for   peripheral neuropathy, DDD, depression, COPD, CKD stage IIIB, hypertension, HIV infection presents to the emergency department due to 4-day onset of nonbloody, but bilious vomiting.  Vomitus was 5-6 episodes daily and this was associated with sharp abdominal pain which was intermittent and was rated as 8/10 on pain scale with no known alleviating/aggravating factors.  She states that she has not been able to keep any food down.  Last bowel movement was yesterday and it was of normal consistency.  She complained of lightheadedness and dizziness, but she denies fever, chills, chest pain, shortness of breath.  She was recently admitted from 7/15 to 05/17/2021 due to CAP POA which was treated with ceftriaxone, azithromycin and Flagyl and was transitioned to Augmentin on discharge.    PT Comments    Patient demonstrates fair/good return for rolling to side and sitting up from side lying position using bed rail and Min assist, slightly increased BLE strength for completing sit to stands, requires Max tactile cueing to advance/slide LLE when taking side steps due to weakness, completed up to 4-5 side steps before having to sit due to fatigue and poor standing balance.  Patient tolerated sitting up in chair after therapy - nursing staff notified.  Patient will benefit from continued physical therapy in hospital and recommended venue below to increase strength, balance, endurance for safe ADLs and gait.     Follow Up Recommendations  SNF     Equipment Recommendations  None recommended by PT    Recommendations for Other Services       Precautions / Restrictions Precautions Precautions: Fall Restrictions Weight Bearing Restrictions: No    Mobility  Bed Mobility Overal bed  mobility: Needs Assistance Bed Mobility: Sidelying to Sit;Sit to Sidelying Rolling: Supervision;Min guard Sidelying to sit: Min guard;Min assist            Transfers Overall transfer level: Needs assistance Equipment used: Rolling walker (2 wheeled) Transfers: Sit to/from [facility] Sit to Stand: Min assist;Mod assist Stand pivot transfers: Mod assist       General transfer comment: slow labored movement with diffiuclty moving LLE due to weakness  Ambulation/Gait Ambulation/Gait assistance: Mod assist;Max assist Gait Distance (Feet): 4 Feet Assistive device: Rolling walker (2 wheeled) Gait Pattern/deviations: Decreased step length - right;Decreased step length - left;Decreased stance time - left;Decreased stride length;Decreased dorsiflexion - left;Shuffle Gait velocity: decreased   General Gait Details: limited to a few sides mostly due to LLE weakness, decreased dorsiflexion, required Max tactile cueing to slide left foot, able to advance RLE, limited due to fatigue/poor standing balance   Stairs             Wheelchair Mobility    Modified Rankin (Stroke Patients Only)       Balance Overall balance assessment: Needs assistance Sitting-balance support: Feet supported;No upper extremity supported Sitting balance-Leahy Scale: Fair Sitting balance - Comments: fair/good seated at EOB   Standing balance support: During functional activity;Bilateral upper extremity supported Standing balance-Leahy Scale: Poor Standing balance comment: using RW                            Cognition Arousal/Alertness: Awake/alert Behavior During Therapy: WFL for tasks assessed/performed Overall Cognitive Status: Within Functional Limits for tasks assessed  Exercises General Exercises - Lower Extremity Long Arc Quad: Seated;AROM;Strengthening;Both;10 reps Hip Flexion/Marching:  Seated;AROM;Strengthening;Both;10 reps Toe Raises: Seated;AROM;Strengthening;Both;10 reps Heel Raises: Seated;AROM;Strengthening;Both;10 reps    General Comments        Pertinent Vitals/Pain Pain Assessment: No/denies pain    Home Living                      Prior Function            PT Goals (current goals can now be found in the care plan section) Acute Rehab PT Goals Patient Stated Goal: return home after rehab PT Goal Formulation: With patient Time For Goal Achievement: 07/07/21 Potential to Achieve Goals: Good Progress towards PT goals: Progressing toward goals    Frequency    Min 3X/week      PT Plan Current plan remains appropriate    Co-evaluation              AM-PAC PT "6 Clicks" Mobility   Outcome Measure  Help needed turning from your back to your side while in a flat bed without using bedrails?: None Help needed moving from lying on your back to sitting on the side of a flat bed without using bedrails?: A Little Help needed moving to and from a bed to a chair (including a wheelchair)?: A Lot Help needed standing up from a chair using your arms (e.g., wheelchair or bedside chair)?: A Lot Help needed to walk in hospital room?: A Lot Help needed climbing 3-5 steps with a railing? : Total 6 Click Score: 14    End of Session   Activity Tolerance: Patient tolerated treatment well;Patient limited by fatigue Patient left: in chair;with call bell/phone within reach Nurse Communication: Mobility status PT Visit Diagnosis: Unsteadiness on feet (R26.81);Other abnormalities of gait and mobility (R26.89);Muscle weakness (generalized) (M62.81)     Time: 9643-8381 PT Time Calculation (min) (ACUTE ONLY): 21 min  Charges:  $Therapeutic Exercise: 8-22 mins $Therapeutic Activity: 8-22 mins                     3:55 PM, 06/25/21 Lonell Grandchild, MPT Physical Therapist with [facility] [phone] office [phone] mobile phone

## 2021-06-25 NOTE — Progress Notes (Signed)
  Subjective: Pt notes that she had 2-3 more bowel movements since she was seen yesterday by our team. She was able to tolerate clear liquids yesterday quite well without any abdominal pain, nausea, or vomiting. She notes that she has no additional concerns at this time.  Objective: Vital signs in last 24 hours: Temp:  [97.8 F (36.6 C)-99 F (37.2 C)] 98.8 F (37.1 C) (08/31 0530) Pulse Rate:  [64-66] 66 (08/31 0530) Resp:  [15-16] 16 (08/31 0530) BP: (148-155)/(72-75) 153/75 (08/31 0530) SpO2:  [97 %-100 %] 99 % (08/31 0530) Last BM Date: 06/24/21  Intake/Output from previous day: 08/30 0701 - 08/31 0700 In: 2494.3 [P.O.:600; I.V.:1794.3; IV Piggyback:100] Out: 4158 [Urine:1500; Emesis/NG output:1000; Stool:1] Intake/Output this shift: No intake/output data recorded.  General appearance: alert, cooperative, and no distress Resp: clear to auscultation bilaterally Cardio: regular rate and rhythm, S1, S2 normal, no murmur, click, rub or gallop GI: soft, non-tender; bowel sounds normal; no masses,  no organomegaly  Lab Results:  Recent Labs    06/24/21 0537  WBC 6.6  HGB 11.7*  HCT 33.8*  PLT 164   BMET Recent Labs    06/23/21 0539 06/24/21 0537  NA 141 139  K 3.2* 3.4*  CL 107 109  CO2 24 25  GLUCOSE 69* 107*  BUN 31* 17  CREATININE 1.29* 0.98  CALCIUM 10.0 9.6   PT/INR No results for input(s): LABPROT, INR in the last 72 hours.  Studies/Results: DG ABD ACUTE 2+V W 1V CHEST  Result Date: 06/24/2021 CLINICAL DATA:  Small-bowel obstruction, NG tube repositioning EXAM: DG ABDOMEN ACUTE WITH 1 VIEW CHEST COMPARISON:  August 28 FINDINGS: Interval retraction of the nasogastric tube with tip overlying the midthoracic esophagus. The cardiomediastinal silhouette is within normal limits. No pleural effusion or pneumothorax. No focal consolidative process in the lungs. The abdomen demonstrates prominent large bowel gas with the cecum measuring at the upper limit of  normal. Surgical clips in the right upper quadrant. No acute osseous abnormality. IMPRESSION: Interval retraction of the enteric tube with tip overlying the midthoracic esophagus. These results will be called to the ordering clinician or representative by the Radiologist Assistant, and communication documented in the PACS or [employer]. Electronically Signed   By: Albin Felling M.D.   On: 06/24/2021 09:35    Anti-infectives: Anti-infectives (From admission, onward)    Start     Dose/Rate Route Frequency Ordered Stop   06/21/21 1800  cefTRIAXone (ROCEPHIN) 1 g in sodium chloride 0.9 % 100 mL IVPB        1 g 200 mL/hr over 30 Minutes Intravenous Every 24 hours 06/21/21 1700         Assessment/Plan: Impression: Hospital Day 5 for this 80 y/o woman with 8/26 CT-demonstrated proximal small bowel obstruction.  Tolerated clear liquid challenge well yesterday after removing NG tube.  Plan: -Advance to soft solids today    LOS: 5 days    [name] A Tiwana Chavis 06/25/2021

## 2021-06-25 NOTE — TOC Progression Note (Signed)
Transition of Care [facility]) - Progression Note    Patient Details  Name: [name] MRN: [id number] Date of Birth: [birth year]  [facility]) CM/SW Contact  Salome Arnt, [place] Phone Number: 06/25/2021, 11:18 AM  Clinical Narrative:  CMA started SNF authorization. LCSW requested COVID test in anticipation of return to [facility]. Facility updated.      Expected Discharge Plan: [place] Barriers to Discharge: Continued Medical Work up  Expected Discharge Plan and Services Expected Discharge Plan: [place] In-house Referral: Clinical Social Work   Post Acute Care Choice: [place] Living arrangements for the past 2 months: [place]                 DME Arranged: N/A                     Social Determinants of Health (SDOH) Interventions    Readmission Risk Interventions Readmission Risk Prevention Plan 06/23/2021 05/11/2021  Transportation Screening Complete Complete  PCP or Specialist Appt within 3-5 Days - Complete  HRI or [place] Complete Complete  Social Work Consult for [place] Planning/Counseling Complete Complete  Palliative Care Screening Not Applicable Not Applicable  Medication Review Press photographer) Complete Complete  Some recent data might be hidden

## 2021-06-25 NOTE — Progress Notes (Signed)
Patient Demographics:    [name], is a 80 y.o. adult, DOB - 1941-08-30, UTM:[id number]  Admit date - 06/20/2021   Admitting Physician Bernadette Hoit, DO  Outpatient Primary MD for the patient is Wannetta Sender, FNP  LOS - 5   Chief Complaint  Patient presents with   Emesis        Subjective:    [name] tolerating CLD, no fever, no CP, no SOB, no dysuria. Expressed some vague abd pain and some nausea/without vomiting.   Assessment  & Plan :    Principal Problem:   SBO (small bowel obstruction) (HCC) Active Problems:   HIV disease ([place])   Acute kidney injury superimposed on CKD (HCC)   Essential hypertension   Nausea & vomiting   Elevated MCV   Abdominal pain   Hypercalcemia   Serum total bilirubin elevated   History of schizophrenia  Brief Summary :- 80 y.o. adult with medical history significant for  peripheral neuropathy, DDD, depression, COPD, CKD stage IIIB, hypertension, HIV infection admitted on 06/20/2021 with persistent emesis and found to have proximal SBO  A/p 1)-Proximal SBO--- continue NG tube to intermittent suction -Decreased breath sounds at the bases appreciated; patient denies abdominal pain -KUB this morning showing NG tube not in the appropriate position; partial SBO resolving -Following general surgery recommendations will continue advancing diet as tolerated -resume oral meds.  -Follow clinical response, maintain adequate hydration and replete electrolytes as needed.  2)Constipation/obstipation--- improved with Dulcolax suppository and enema as ordered -Will need to be on a bowel regimen prior to discharge home -Maintain adequate hydration.  3) urinary symptoms--- UA suggestive of UTI and culture demonstrating more than 100,000 colonies of Proteus mirabilis more than 60,000 colonies of Pseudomonas. -treated with rocephin and asymptomatic  now -will stop abx's -maintain adequate hydration  4)AKI----acute kidney injury on CKD stage -3B -Creatinine is currently down to 0.98 from 1.80 on admission  -Baseline creatinine usually around 1.3 -Continue IV fluids and maintain adequate hydraiton. -renally adjust medications, avoid nephrotoxic agents / dehydration  / hypotension  5)History of HIV disease -Patient follows with Dr. [name] -resume oral HIV meds.  6)GERD- -continue Protonix  7)HTN--- BP stable overall -continue to use IV labetalol as needed elevated BP  8)FEN/Hypokalemia ---  -recurrent episodes of hypoglycemia while n.p.o. -D5 and K solution started and will be continued for now. -follow electrolyte trend  9)Generalized weakness and deconditioning- -PT eval appreciated recommends SNF rehab -TOC aware and will help with assistance once medically stable.  Disposition/Need for in-Hospital Stay- patient unable to be discharged at this time due to -small bowel obstruction requiring NG tube and IV fluids pending return of bowel function and tolerance of oral intake*  Status is: Inpatient  Remains inpatient appropriate because: Please see disposition above  Disposition: The patient is from: Home              Anticipated d/c is to: SNF              Anticipated d/c date is: >2 days              Patient currently is not medically stable to d/c.  Barriers: Not Clinically Stable-    Code Status : -  Code Status: Full Code   Family Communication:   No family at bedside; (patient is alert, awake and coherent)   Consults  :  Gen Surg  DVT Prophylaxis  :   - SCDs  heparin injection 5,000 Units Start: 06/21/21 2200 SCDs Start: 06/21/21 0122    Lab Results  Component Value Date   PLT 164 06/24/2021    Inpatient Medications  Scheduled Meds:  abacavir-dolutegravir-lamiVUDine  1 tablet Oral Daily   heparin injection (subcutaneous)  5,000 Units Subcutaneous Q8H   pantoprazole (PROTONIX) IV  40 mg  Intravenous Q24H   Continuous Infusions:  dextrose 5 % and 0.45 % NaCl with KCl 20 mEq/L 75 mL/hr at 06/23/21 1633   PRN Meds:.acetaminophen, fentaNYL (SUBLIMAZE) injection, labetalol, LORazepam, ondansetron (ZOFRAN) IV, sodium phosphate   Anti-infectives (From admission, onward)    Start     Dose/Rate Route Frequency Ordered Stop   06/25/21 1200  abacavir-dolutegravir-lamiVUDine (TRIUMEQ) 600-50-300 MG per tablet 1 tablet        1 tablet Oral Daily 06/25/21 1053     06/21/21 1800  cefTRIAXone (ROCEPHIN) 1 g in sodium chloride 0.9 % 100 mL IVPB  Status:  Discontinued        1 g 200 mL/hr over 30 Minutes Intravenous Every 24 hours 06/21/21 1700 06/25/21 1051         Objective:   Vitals:   06/24/21 1434 06/24/21 2042 06/25/21 0530 06/25/21 1417  BP: (!) 155/72 (!) 148/72 (!) 153/75 (!) 167/71  Pulse: 64 64 66 73  Resp: 15 16 16 20   Temp: 97.8 F (36.6 C) 99 F (37.2 C) 98.8 F (37.1 C) 98.4 F (36.9 C)  TempSrc: Oral Oral Oral Oral  SpO2: 100% 97% 99% 98%  Weight:      Height:        Wt Readings from Last 3 Encounters:  06/21/21 72.3 kg  05/11/21 70.8 kg  12/23/20 74.4 kg     Intake/Output Summary (Last 24 hours) at 06/25/2021 1759 Last data filed at 06/25/2021 0900 Gross per 24 hour  Intake 480 ml  Output 201 ml  Net 279 ml    Physical Exam General exam: Alert, awake, oriented x 3, reports mild nausea, but not vomiting. Patient expressed mild abd pain. No fever, no dysuria. Respiratory system: Clear to auscultation. Respiratory effort normal. No requiring O2 supplementation, good sat on RA> Cardiovascular system:RRR. No murmurs, rubs, gallops. No JVD Gastrointestinal system: Abdomen is nondistended, soft and vaguely tender in her lower region across. Positive BS. Central nervous system: Alert and oriented. No focal neurological deficits. Extremities: No cyanosis, no clubbing  Skin: No petechiae Psychiatry: Judgement and insight appear normal. Mood & affect  appropriate.      Data Review:   Micro Results Recent Results (from the past 240 hour(s))  Resp Panel by RT-PCR (Flu A&B, Covid) Nasopharyngeal Swab     Status: None   Collection Time: 06/20/21 11:00 PM   Specimen: Nasopharyngeal Swab; Nasopharyngeal(NP) swabs in vial transport medium  Result Value Ref Range Status   SARS Coronavirus 2 by RT PCR NEGATIVE NEGATIVE Final    Comment: (NOTE) SARS-CoV-2 target nucleic acids are NOT DETECTED.  The SARS-CoV-2 RNA is generally detectable in upper respiratory specimens during the acute phase of infection. The lowest concentration of SARS-CoV-2 viral copies this assay can detect is 138 copies/mL. A negative result does not preclude SARS-Cov-2 infection and should not be used as the sole basis for treatment or other patient management decisions. A negative  result may occur with  improper specimen collection/handling, submission of specimen other than nasopharyngeal swab, presence of viral mutation(s) within the areas targeted by this assay, and inadequate number of viral copies(<138 copies/mL). A negative result must be combined with clinical observations, patient history, and epidemiological information. The expected result is Negative.  Fact Sheet for Patients:  EntrepreneurPulse.com.au  Fact Sheet for Healthcare Providers:  IncredibleEmployment.be  This test is no t yet approved or cleared by the [place] FDA and  has been authorized for detection and/or diagnosis of SARS-CoV-2 by FDA under an Emergency Use Authorization (EUA). This EUA will remain  in effect (meaning this test can be used) for the duration of the COVID-19 declaration under Section 564(b)(1) of the Act, 21 U.S.C.section 360bbb-3(b)(1), unless the authorization is terminated  or revoked sooner.       Influenza A by PCR NEGATIVE NEGATIVE Final   Influenza B by PCR NEGATIVE NEGATIVE Final    Comment: (NOTE) The Xpert Xpress  SARS-CoV-2/FLU/RSV plus assay is intended as an aid in the diagnosis of influenza from Nasopharyngeal swab specimens and should not be used as a sole basis for treatment. Nasal washings and aspirates are unacceptable for Xpert Xpress SARS-CoV-2/FLU/RSV testing.  Fact Sheet for Patients: EntrepreneurPulse.com.au  Fact Sheet for Healthcare Providers: IncredibleEmployment.be  This test is not yet approved or cleared by the [place] FDA and has been authorized for detection and/or diagnosis of SARS-CoV-2 by FDA under an Emergency Use Authorization (EUA). This EUA will remain in effect (meaning this test can be used) for the duration of the COVID-19 declaration under Section 564(b)(1) of the Act, 21 U.S.C. section 360bbb-3(b)(1), unless the authorization is terminated or revoked.  Performed at [facility], [address] [place], [place] [address]   MRSA Next Gen by PCR, Nasal     Status: None   Collection Time: 06/21/21  2:13 AM   Specimen: Nasal Mucosa; Nasal Swab  Result Value Ref Range Status   MRSA by PCR Next Gen NOT DETECTED NOT DETECTED Final    Comment: (NOTE) The GeneXpert MRSA Assay (FDA approved for NASAL specimens only), is one component of a comprehensive MRSA colonization surveillance program. It is not intended to diagnose MRSA infection nor to guide or monitor treatment for MRSA infections. Test performance is not FDA approved in patients less than 95 years old. Performed at [facility], [address], [place], [place] [address]   Urine Culture     Status: Abnormal (Preliminary result)   Collection Time: 06/22/21  6:00 AM   Specimen: Urine, Clean Catch  Result Value Ref Range Status   Specimen Description   Final    URINE, CLEAN CATCH Performed at [facility], [address], [place], [place] [address]    Special Requests   Final    NONE Performed at [facility], [address], [place], [place] [address]    Culture  (A)  Final    >=100,000 COLONIES/mL PROTEUS MIRABILIS 60,000 COLONIES/mL PSEUDOMONAS AERUGINOSA SUSCEPTIBILITIES TO FOLLOW Performed at [facility] [address], [place], [place] [address]    Report Status PENDING  Incomplete   Organism ID, Bacteria PROTEUS MIRABILIS (A)  Final      Susceptibility   Proteus mirabilis - MIC*    AMPICILLIN <=2 SENSITIVE Sensitive     CEFAZOLIN <=4 SENSITIVE Sensitive     CEFEPIME <=0.12 SENSITIVE Sensitive     CEFTRIAXONE <=0.25 SENSITIVE Sensitive     CIPROFLOXACIN <=0.25 SENSITIVE Sensitive  GENTAMICIN <=1 SENSITIVE Sensitive     IMIPENEM 4 SENSITIVE Sensitive     NITROFURANTOIN 128 RESISTANT Resistant     TRIMETH/SULFA <=20 SENSITIVE Sensitive     AMPICILLIN/SULBACTAM <=2 SENSITIVE Sensitive     PIP/TAZO <=4 SENSITIVE Sensitive     * >=100,000 COLONIES/mL PROTEUS MIRABILIS    Radiology Reports CT ABDOMEN PELVIS WO CONTRAST  Result Date: 06/20/2021 CLINICAL DATA:  Abdomen pain vomiting EXAM: CT ABDOMEN AND PELVIS WITHOUT CONTRAST TECHNIQUE: Multidetector CT imaging of the abdomen and pelvis was performed following the standard protocol without IV contrast. COMPARISON:  CT 05/28/2020, 11/12/2018 FINDINGS: Lower chest: Lung bases demonstrate no acute consolidation or effusion. Atelectasis or scarring at the bases. Coronary vascular calcification. Hepatobiliary: No focal liver abnormality is seen. Status post cholecystectomy. No biliary dilatation. Pancreas: Unremarkable. No pancreatic ductal dilatation or surrounding inflammatory changes. Spleen: Normal in size without focal abnormality. Adrenals/Urinary Tract: Right adrenal gland is normal. 19 mm left adrenal nodule, stable consistent with adenoma. Kidneys show no hydronephrosis. The bladder is normal Stomach/Bowel: Moderate fluid distension of the stomach. Dilated fluid-filled duodenum and proximal jejunum. Slightly swirled appearance of mesentery and bowel within the right mid abdomen, series  2, image 53 with relatively decompressed remainder of the distal small bowel. No acute bowel wall thickening. Large volume of stool in the colon and rectum. Interval takedown of left abdominal colostomy with reanastomosis at the sigmoid colon. Possible mild: Narrowing slightly distal to the anastomosis, series 2, image 62 and coronal series 5 image 42. Large stool in the rectum distal to this. Vascular/Lymphatic: Moderate aortic atherosclerosis. No aneurysm. No suspicious nodes Reproductive: Status post hysterectomy. No adnexal masses. Other: No free air. No pelvic effusion. Mild presacral soft tissue stranding. Mild nonspecific soft tissue stranding in left colic gutter. Interval repair of previously noted ventral hernias and parastomal hernias. Small periumbilical region hernia containing fat and small bowel. Musculoskeletal: Degenerative changes. No acute osseous abnormality. Post laminectomy changes L3 through S1. IMPRESSION: 1. Findings suspicious for proximal bowel obstruction with moderate fluid distension of the stomach, duodenum and proximal jejunum with suspected area of transition in the proximal jejunum right mid abdominal region where there is slightly swirled appearance of bowel and mesentery, either due to incomplete volvulus versus hernia versus adhesion. 2. Interval takedown of left abdominal colostomy with reanastomosis at sigmoid colon. Possible caliber change/stricture at the rectosigmoid colon slightly distal to the anastomosis. There is large stool burden throughout the colon with marked rectal distension by feces. There is some perirectal edema and presacral soft tissue stranding. 3. Interval repair of ventral hernias and previously noted parastomal hernia. Small residual ventral hernia containing mesenteric and small bowel. 4. Stable left adrenal adenoma. Electronically Signed   By: Donavan Foil M.D.   On: 06/20/2021 22:43   DG Abd 1 View  Result Date: 06/22/2021 CLINICAL DATA:   Nasogastric tube. EXAM: ABDOMEN - 1 VIEW COMPARISON:  None. FINDINGS: The bowel gas pattern is normal. Distal tip of nasogastric tube is seen in the stomach. Status post cholecystectomy. No radio-opaque calculi or other significant radiographic abnormality are seen. IMPRESSION: Distal tip of nasogastric tube seen in stomach. Electronically Signed   By: Marijo Conception M.D.   On: 06/22/2021 08:09   DG Chest Port 1 View  Result Date: 06/21/2021 CLINICAL DATA:  NG placement. EXAM: PORTABLE CHEST 1 VIEW COMPARISON:  Chest radiograph dated 05/09/2021 and CT dated 05/09/2021. FINDINGS: Enteric tube extends below the diaphragm with side-port in the left upper abdomen and tip  beyond the inferior margin of the image, likely in the stomach. Minimal left lung base atelectasis/scarring. No focal consolidation, pleural effusion or pneumothorax. The cardiac silhouette is within limits. Osteopenia with degenerative changes the spine. No acute osseous pathology. IMPRESSION: Enteric tube with tip beyond the inferior margin of the image, likely in the stomach. Electronically Signed   By: Anner Crete M.D.   On: 06/21/2021 02:09   DG Chest Port 1V same Day  Result Date: 06/21/2021 CLINICAL DATA:  Post NG tube Ree insertion. EXAM: PORTABLE CHEST 1 VIEW COMPARISON:  Chest x-ray from earlier same day. FINDINGS: Enteric tube appears appropriately positioned in the stomach. Heart size and mediastinal contours are stable. Lungs are clear. No pleural effusion or pneumothorax is seen. IMPRESSION: Enteric tube appears appropriately positioned in the stomach. Electronically Signed   By: Franki Cabot M.D.   On: 06/21/2021 12:05   DG ABD ACUTE 2+V W 1V CHEST  Result Date: 06/24/2021 CLINICAL DATA:  Small-bowel obstruction, NG tube repositioning EXAM: DG ABDOMEN ACUTE WITH 1 VIEW CHEST COMPARISON:  August 28 FINDINGS: Interval retraction of the nasogastric tube with tip overlying the midthoracic esophagus. The cardiomediastinal  silhouette is within normal limits. No pleural effusion or pneumothorax. No focal consolidative process in the lungs. The abdomen demonstrates prominent large bowel gas with the cecum measuring at the upper limit of normal. Surgical clips in the right upper quadrant. No acute osseous abnormality. IMPRESSION: Interval retraction of the enteric tube with tip overlying the midthoracic esophagus. These results will be called to the ordering clinician or representative by the Radiologist Assistant, and communication documented in the PACS or [employer]. Electronically Signed   By: Albin Felling M.D.   On: 06/24/2021 09:35     CBC Recent Labs  Lab 06/20/21 2102 06/21/21 0449 06/22/21 0653 06/24/21 0537  WBC 10.5 9.3 6.0 6.6  HGB 14.1 13.2 11.3* 11.7*  HCT 40.5 38.6 33.4* 33.8*  PLT 218 188 169 164  MCV 105.5* 106.3* 107.4* 104.3*  MCH 36.7* 36.4* 36.3* 36.1*  MCHC 34.8 34.2 33.8 34.6  RDW 12.5 12.4 12.4 12.2  LYMPHSABS 1.9  --   --   --   MONOABS 0.6  --   --   --   EOSABS 0.0  --   --   --   BASOSABS 0.0  --   --   --     Chemistries  Recent Labs  Lab 06/20/21 2102 06/21/21 0449 06/22/21 0653 06/23/21 0539 06/24/21 0537  NA 134* 139 141 141 139  K 4.7 3.9 3.9 3.2* 3.4*  CL 91* 97* 104 107 109  CO2 34* 29 27 24 25   GLUCOSE 130* 108* 84 69* 107*  BUN 42* 44* 41* 31* 17  CREATININE 1.80* 1.72* 1.73* 1.29* 0.98  CALCIUM 11.1* 11.0* 9.8 10.0 9.6  MG  --  2.4  --   --  2.0  AST 30 22  --   --   --   ALT 22 20  --   --   --   ALKPHOS 71 71  --   --   --   BILITOT 1.4* 1.3*  --   --   --     Lab Results  Component Value Date   HGBA1C 4.9 09/30/2020    Coagulation profile Recent Labs  Lab 06/21/21 0449  INR 1.1    ------------------------------------------------------------------------------------------------------------------    Component Value Date/Time   BNP 184.0 (H) 03/29/2017 [place]  M.D on 06/25/2021 at 5:59 PM  Go to [email or web address] -  for contact info  [facility] - Office  [phone]

## 2021-06-25 NOTE — Care Management Important Message (Signed)
Important Message  Patient Details  Name: [name] MRN: [id number] Date of Birth: [birth year]   Medicare Important Message Given:  Yes     [name] 06/25/2021, 12:15 PM

## 2021-06-26 LAB — URINE CULTURE: Culture: >=100000 — AB

## 2021-06-26 LAB — RESP PANEL BY RT-PCR (FLU A&B, COVID) ARPGX2
Influenza A by PCR: NEGATIVE
Influenza B by PCR: NEGATIVE
SARS Coronavirus 2 by RT PCR: NEGATIVE

## 2021-06-26 LAB — GLUCOSE, CAPILLARY
Glucose-Capillary: 115 mg/dL — ABNORMAL HIGH (ref 70–99)
Glucose-Capillary: 121 mg/dL — ABNORMAL HIGH (ref 70–99)
Glucose-Capillary: 138 mg/dL — ABNORMAL HIGH (ref 70–99)
Glucose-Capillary: 97 mg/dL (ref 70–99)

## 2021-06-26 MED ORDER — POLYETHYLENE GLYCOL 3350 17 G PO PACK: 17.0000 g | PACK | Freq: Every day | ORAL | Status: DC

## 2021-06-26 MED ORDER — CLONAZEPAM 1 MG PO TABS: 1.0000 mg | ORAL_TABLET | Freq: Two times a day (BID) | ORAL | 0 refills | Status: DC | PRN

## 2021-06-26 MED ORDER — ACETAMINOPHEN 325 MG PO TABS
650.0000 mg | ORAL_TABLET | Freq: Four times a day (QID) | ORAL | Status: DC | PRN
  Administered 2021-06-26: 650 mg via ORAL

## 2021-06-26 NOTE — Progress Notes (Signed)
Attempted to Call REPORT to [facility]. No answer. Will attempt again, will leave number in patient packet as well.

## 2021-06-26 NOTE — TOC Transition Note (Signed)
Transition of Care [facility]) - CM/SW Discharge Note   Patient Details  Name: GENIE WENKE MRN: [id number] Date of Birth: [birth year]  Transition of Care [facility]) CM/SW Contact:  Natasha Bence, LCSW Phone Number: 06/26/2021, 2:49 PM   Clinical Narrative:    CSW notified of patient's readiness for discharge. CSW contacted Melissa with [employer]. [facility] agreeable to take patient. CSW completed med necessity and called EMS. Nurse to call report. TOC signing off.   Final next level of care: Skilled Nursing Facility Barriers to Discharge: Barriers Resolved   Patient Goals and CMS Choice Patient states their goals for this hospitalization and ongoing recovery are:: return to SNF CMS Medicare.gov Compare Post Acute Care list provided to:: Patient Choice offered to / list presented to : Patient  Discharge Placement                Patient to be transferred to facility by: [facility] Name of family member notified: Collins,[name] (Other)   [phone] Patient and family notified of of transfer: 06/26/21  Discharge Plan and Services In-house Referral: Clinical Social Work   Post Acute Care Choice: [place]          DME Arranged: N/A                    Social Determinants of Health ([place]) Interventions     Readmission Risk Interventions Readmission Risk Prevention Plan 06/23/2021 05/11/2021  Transportation Screening Complete Complete  PCP or Specialist Appt within 3-5 Days - Complete  HRI or Home Care Consult Complete Complete  Social Work Consult for [place] Planning/Counseling Complete Complete  Palliative Care Screening Not Applicable Not Applicable  Medication Review Press photographer) Complete Complete  Some recent data might be hidden

## 2021-06-26 NOTE — Care Management Important Message (Signed)
Important Message  Patient Details  Name: [name] MRN: [id number] Date of Birth: Oct 04, 1941   Medicare Important Message Given:  Yes     [name] 06/26/2021, 12:45 PM

## 2021-06-26 NOTE — Discharge Summary (Signed)
Physician Discharge Summary  Mahima Hottle Kwiatek YDX:[id number] DOB: 05-04-1941 DOA: 06/20/2021  PCP: Wannetta Sender, FNP  Admit date: 06/20/2021 Discharge date: 06/26/2021  Time spent: 35 minutes  Recommendations for Outpatient Follow-up:  Repeat basic metabolic panel to evaluate lites and a function Reassess blood pressure and adjust antihypertensive regimen as needed   Discharge Diagnoses:  Principal Problem:   SBO (small bowel obstruction) ([place]) Active Problems:   HIV disease ([place])   Acute kidney injury superimposed on CKD ([place])   Essential hypertension   Nausea & vomiting   Elevated MCV   Abdominal pain   Hypercalcemia   Serum total bilirubin elevated   History of schizophrenia   Discharge Condition: Stable and improved.  Discharge to skilled nursing facility for further care and rehabilitation.  Outpatient follow-up with PCP and infectious disease service recommended.  CODE STATUS: Full code.  Diet recommendation: Heart healthy diet.  Filed Weights   06/20/21 2031 06/21/21 0108  Weight: 65.3 kg 72.3 kg    History of present illness:  As per H&P written by Dr. Josephine Cables on 06/20/2021  Garnet Koyanagi Meno is an 80 y.o. adult with medical history significant for   peripheral neuropathy, DDD, depression, COPD, CKD stage IIIB, hypertension, HIV infection presents to the emergency department due to 4-day onset of nonbloody, but bilious vomiting.  Vomitus was 5-6 episodes daily and this was associated with sharp abdominal pain which was intermittent and was rated as 8/10 on pain scale with no known alleviating/aggravating factors.  She states that she has not been able to keep any food down.  Last bowel movement was yesterday and it was of normal consistency.  She complained of lightheadedness and dizziness, but she denies fever, chills, chest pain, shortness of breath. She was recently admitted from 7/15 to 05/17/2021 due to CAP POA which was treated with ceftriaxone, azithromycin  and Flagyl and was transitioned to Augmentin on discharge.   ED Course:  In the emergency department, he was hemodynamically stable.  Work-up in the ED showed normal CBC except for elevated MCV at 205.5, total bilirubin 1.4, calcium 11.1, BUN alacrity and 42/1.80 (baseline creatinine at 1.2-1.4).  Influenza A, B, SARS coronavirus 2 was negative. CT abdomen and pelvis without contrast was suspicious for proximal bowel obstruction. She was treated with IV fentanyl 25 mcg x 1, Zofran was given.  Hospitalist was asked to admit patient for further evaluation and management.  Hospital Course:  1)-Proximal SBO--- continue NG tube to intermittent suction -Decreased breath sounds at the bases appreciated; patient denies abdominal pain -Follow-up on KUB demonstrating partial SBO resolving appropriately. -Following general surgery recommendations diet has been fully advance and well-tolerated; patient also tolerating oral medications. -Advised to maintain adequate hydration. -No surgical intervention required. -Repeat basic metabolic panel to follow electrolytes and stability.   2)Constipation/obstipation--- improved with Dulcolax suppository and enema. -Patient discharge with instructions to use MiraLAX and Colace on daily basis. -Also advised to increase fiber in her diet and maintain adequate hydration.     3) urinary symptoms--- UA suggestive of UTI and culture demonstrating more than 100,000 colonies of Proteus mirabilis  -treated with rocephin x4 days and asymptomatic now -maintain adequate hydration -At discharge no fever, no dysuria, no increased frequency   4)AKI----acute kidney injury on CKD stage -3B -Creatinine is currently down to 0.98 from 1.80 on admission  -Baseline creatinine usually around 1.3 -Continue to maintain adequate hydration and keep minimizing the use of nephrotoxic agents.   5)History of HIV disease -  Patient follows with Dr. [name] -Continue home antiviral regimen;  outpatient follow-up with infectious disease as previously instructed.   6)GERD- -continue Protonix   7)HTN--- BP stable overall -Continue home antihypertensive regimen.   8)FEN/Hypokalemia ---  -electrolytes repleted and WNL after hydration and resumption of her ability totolerate PO's.   9)Generalized weakness and deconditioning- -PT eval appreciated recommendations for SNF rehab given. -TOC aware and will help with placement  10) anxiety/depression -Continue as needed benzodiazepines. -Resume the use of Cymbalta.    Procedures: See below for x-ray reports.   Consultations: General surgery   Discharge Exam: Vitals:   06/25/21 2109 06/26/21 0604  BP: (!) 143/69 (!) 159/52  Pulse: 67 84  Resp:    Temp: 99.3 F (37.4 C) 98.2 F (36.8 C)  SpO2: 99% 96%    General: Afebrile, no chest pain, no nausea, no vomiting, reports no abdominal pain and is tolerating diet.  Expressed feeling weak and tired. Cardiovascular: S1 and S2, no rubs, no gallops, no JVD. Respiratory: Clear to auscultation bilaterally; no using accessory muscle.  Good saturation on room air. Abdomen: Soft, nontender, positive bowel sounds, no guarding appreciated on exam. Extremities: No cyanosis or clubbing.  Discharge Instructions   Discharge Instructions     Diet - low sodium heart healthy   Complete by: As directed    Discharge instructions   Complete by: As directed    Take medications as prescribed Maintain adequate hydration Physical therapy and rehabilitation as per the skilled nursing facility protocol Follow-up with infectious disease service as previously instructed.      Allergies as of 06/26/2021       Reactions   Bee Venom Anaphylaxis   Promethazine Swelling   Tenofovir Disoproxil Fumarate [tenofovir Disoproxil Fumarate] Other (See Comments)   Renal failure, (08/27/2006)   Compazine  [prochlorperazine Edisylate] Nausea And Vomiting   Haloperidol Lactate Other (See Comments)    Reaction is muscle tension, causes severe spasms in face and neck. Forces eyes to roll in the back of the head   Sulfa Antibiotics Diarrhea   Brompheniramine-acetaminophen    Chlorcyclizine    Chlorpromazine    Codeine Itching, Nausea And Vomiting   Navane [thiothixene] Other (See Comments), Nausea And Vomiting   Same muscle spasm reaction as haldol   Prednisone    Brief mild psychosis and agitation   Promethazine Hcl Other (See Comments)   Propoxyphene N-acetaminophen Itching, Nausea And Vomiting   Morphine Itching, Swelling        Medication List     STOP taking these medications    nystatin powder Commonly known as: MYCOSTATIN/NYSTOP   psyllium 95 % Pack Commonly known as: HYDROCIL/METAMUCIL   Xtampza ER 13.5 MG C12a Generic drug: oxyCODONE ER       TAKE these medications    acetaminophen 500 MG tablet Commonly known as: TYLENOL Take 2 tablets (1,000 mg total) by mouth every 6 (six) hours as needed for mild pain or moderate pain.   albuterol 108 (90 Base) MCG/ACT inhaler Commonly known as: VENTOLIN HFA Inhale 1-2 puffs into the lungs every 6 (six) hours as needed for wheezing or shortness of breath.   Allergy Relief 10 MG tablet Generic drug: loratadine Take 10 mg by mouth daily.   atorvastatin 20 MG tablet Commonly known as: LIPITOR Take 20 mg by mouth daily.   clonazePAM 1 MG tablet Commonly known as: KLONOPIN Take 1 tablet (1 mg total) by mouth 2 (two) times daily as needed for anxiety.  diphenhydrAMINE 25 mg capsule Commonly known as: BENADRYL Take 25 mg by mouth daily as needed for itching (itching from codeine).   docusate sodium 100 MG capsule Commonly known as: COLACE Take 1 capsule (100 mg total) by mouth 2 (two) times daily.   DULoxetine 60 MG capsule Commonly known as: CYMBALTA Take 60 mg by mouth daily.   fluticasone 50 MCG/ACT nasal spray Commonly known as: FLONASE 2 SPRAYS INTO BOTH NOSTRILS AT BEDTIME. What changed: See the new  instructions.   furosemide 40 MG tablet Commonly known as: LASIX Take 1 tablet (40 mg total) by mouth daily as needed for fluid. Hold until follow up with PCP What changed: additional instructions   ipratropium-albuterol 0.5-2.5 (3) MG/3ML Soln Commonly known as: DUONEB Inhale 3 mLs into the lungs every 6 (six) hours as needed (shortness of breath).   labetalol 100 MG tablet Commonly known as: NORMODYNE Take 1 tablet (100 mg total) by mouth 2 (two) times daily.   losartan 25 MG tablet Commonly known as: COZAAR Take 12.5 mg by mouth daily.   Magnesium 100 MG Tabs Take 50 mg by mouth daily.   megestrol 400 MG/10ML suspension Commonly known as: MEGACE Take 400 mg by mouth daily. For appetite stimulant   mirtazapine 7.5 MG tablet Commonly known as: REMERON Take 15 mg by mouth at bedtime. For appetite/depression   ondansetron 8 MG tablet Commonly known as: ZOFRAN Take 8 mg by mouth 2 (two) times daily.   ONE-A-DAY 55 PLUS PO Take 1 tablet by mouth daily.   pantoprazole 40 MG tablet Commonly known as: Protonix Take 1 tablet (40 mg total) by mouth daily before breakfast.   polyethylene glycol 17 g packet Commonly known as: MiraLax Take 17 g by mouth daily. Hold for diarrhea   polyvinyl alcohol 1.4 % ophthalmic solution Commonly known as: LIQUIFILM TEARS Place 1 drop into both eyes daily.   thiamine 100 MG tablet Commonly known as: Vitamin B-1 Take 100 mg by mouth daily.   Triumeq 600-50-300 MG tablet Generic drug: abacavir-dolutegravir-lamiVUDine Take 1 tablet by mouth daily.       Allergies  Allergen Reactions   Bee Venom Anaphylaxis   Promethazine Swelling   Tenofovir Disoproxil Fumarate [Tenofovir Disoproxil Fumarate] Other (See Comments)    Renal failure, (08/27/2006)   Compazine  [Prochlorperazine Edisylate] Nausea And Vomiting   Haloperidol Lactate Other (See Comments)    Reaction is muscle tension, causes severe spasms in face and neck. Forces eyes  to roll in the back of the head   Sulfa Antibiotics Diarrhea   Brompheniramine-Acetaminophen    Chlorcyclizine    Chlorpromazine    Codeine Itching and Nausea And Vomiting   Navane [Thiothixene] Other (See Comments) and Nausea And Vomiting    Same muscle spasm reaction as haldol   Prednisone     Brief mild psychosis and agitation   Promethazine Hcl Other (See Comments)   Propoxyphene N-Acetaminophen Itching and Nausea And Vomiting   Morphine Itching and Swelling    Follow-up Information     Wannetta Sender, FNP. Schedule an appointment as soon as possible for a visit in 10 day(s).   Specialty: Family Medicine Contact information: [place] [address] [place] [address] [phone]         Arnoldo Lenis, MD .   Specialty: Cardiology Contact information: [place] [address] [place]  [place], Lavell Islam, MD. Go today.   Specialty: Infectious Diseases Why: as previously instructed Contact information: [address] [place] [address] [phone]                 The results of significant diagnostics from this hospitalization (including imaging, microbiology, ancillary and laboratory) are listed below for reference.    Significant Diagnostic Studies: CT ABDOMEN PELVIS WO CONTRAST  Result Date: 06/20/2021 CLINICAL DATA:  Abdomen pain vomiting EXAM: CT ABDOMEN AND PELVIS WITHOUT CONTRAST TECHNIQUE: Multidetector CT imaging of the abdomen and pelvis was performed following the standard protocol without IV contrast. COMPARISON:  CT 05/28/2020, 11/12/2018 FINDINGS: Lower chest: Lung bases demonstrate no acute consolidation or effusion. Atelectasis or scarring at the bases. Coronary vascular calcification. Hepatobiliary: No focal liver abnormality is seen. Status post cholecystectomy. No biliary dilatation. Pancreas: Unremarkable. No pancreatic ductal  dilatation or surrounding inflammatory changes. Spleen: Normal in size without focal abnormality. Adrenals/Urinary Tract: Right adrenal gland is normal. 19 mm left adrenal nodule, stable consistent with adenoma. Kidneys show no hydronephrosis. The bladder is normal Stomach/Bowel: Moderate fluid distension of the stomach. Dilated fluid-filled duodenum and proximal jejunum. Slightly swirled appearance of mesentery and bowel within the right mid abdomen, series 2, image 53 with relatively decompressed remainder of the distal small bowel. No acute bowel wall thickening. Large volume of stool in the colon and rectum. Interval takedown of left abdominal colostomy with reanastomosis at the sigmoid colon. Possible mild: Narrowing slightly distal to the anastomosis, series 2, image 62 and coronal series 5 image 42. Large stool in the rectum distal to this. Vascular/Lymphatic: Moderate aortic atherosclerosis. No aneurysm. No suspicious nodes Reproductive: Status post hysterectomy. No adnexal masses. Other: No free air. No pelvic effusion. Mild presacral soft tissue stranding. Mild nonspecific soft tissue stranding in left colic gutter. Interval repair of previously noted ventral hernias and parastomal hernias. Small periumbilical region hernia containing fat and small bowel. Musculoskeletal: Degenerative changes. No acute osseous abnormality. Post laminectomy changes L3 through S1. IMPRESSION: 1. Findings suspicious for proximal bowel obstruction with moderate fluid distension of the stomach, duodenum and proximal jejunum with suspected area of transition in the proximal jejunum right mid abdominal region where there is slightly swirled appearance of bowel and mesentery, either due to incomplete volvulus versus hernia versus adhesion. 2. Interval takedown of left abdominal colostomy with reanastomosis at sigmoid colon. Possible caliber change/stricture at the rectosigmoid colon slightly distal to the anastomosis. There is  large stool burden throughout the colon with marked rectal distension by feces. There is some perirectal edema and presacral soft tissue stranding. 3. Interval repair of ventral hernias and previously noted parastomal hernia. Small residual ventral hernia containing mesenteric and small bowel. 4. Stable left adrenal adenoma. Electronically Signed   By: Donavan Foil M.D.   On: 06/20/2021 22:43   DG Abd 1 View  Result Date: 06/22/2021 CLINICAL DATA:  Nasogastric tube. EXAM: ABDOMEN - 1 VIEW COMPARISON:  None. FINDINGS: The bowel gas pattern is normal. Distal tip of nasogastric tube is seen in the stomach. Status post cholecystectomy. No radio-opaque calculi or other significant radiographic abnormality are seen. IMPRESSION: Distal tip of nasogastric tube seen in stomach. Electronically Signed   By: Marijo Conception M.D.   On: 06/22/2021 08:09   DG Chest Port 1 View  Result Date: 06/21/2021 CLINICAL DATA:  NG placement. EXAM: PORTABLE CHEST 1 VIEW COMPARISON:  Chest radiograph dated 05/09/2021 and CT dated 05/09/2021. FINDINGS: Enteric tube extends below the diaphragm with  side-port in the left upper abdomen and tip beyond the inferior margin of the image, likely in the stomach. Minimal left lung base atelectasis/scarring. No focal consolidation, pleural effusion or pneumothorax. The cardiac silhouette is within limits. Osteopenia with degenerative changes the spine. No acute osseous pathology. IMPRESSION: Enteric tube with tip beyond the inferior margin of the image, likely in the stomach. Electronically Signed   By: Anner Crete M.D.   On: 06/21/2021 02:09   DG Chest Port 1V same Day  Result Date: 06/21/2021 CLINICAL DATA:  Post NG tube Ree insertion. EXAM: PORTABLE CHEST 1 VIEW COMPARISON:  Chest x-ray from earlier same day. FINDINGS: Enteric tube appears appropriately positioned in the stomach. Heart size and mediastinal contours are stable. Lungs are clear. No pleural effusion or pneumothorax is  seen. IMPRESSION: Enteric tube appears appropriately positioned in the stomach. Electronically Signed   By: Franki Cabot M.D.   On: 06/21/2021 12:05   DG ABD ACUTE 2+V W 1V CHEST  Result Date: 06/24/2021 CLINICAL DATA:  Small-bowel obstruction, NG tube repositioning EXAM: DG ABDOMEN ACUTE WITH 1 VIEW CHEST COMPARISON:  August 28 FINDINGS: Interval retraction of the nasogastric tube with tip overlying the midthoracic esophagus. The cardiomediastinal silhouette is within normal limits. No pleural effusion or pneumothorax. No focal consolidative process in the lungs. The abdomen demonstrates prominent large bowel gas with the cecum measuring at the upper limit of normal. Surgical clips in the right upper quadrant. No acute osseous abnormality. IMPRESSION: Interval retraction of the enteric tube with tip overlying the midthoracic esophagus. These results will be called to the ordering clinician or representative by the Radiologist Assistant, and communication documented in the PACS or [employer]. Electronically Signed   By: Albin Felling M.D.   On: 06/24/2021 09:35    Microbiology: Recent Results (from the past 240 hour(s))  Resp Panel by RT-PCR (Flu A&B, Covid) Nasopharyngeal Swab     Status: None   Collection Time: 06/20/21 11:00 PM   Specimen: Nasopharyngeal Swab; Nasopharyngeal(NP) swabs in vial transport medium  Result Value Ref Range Status   SARS Coronavirus 2 by RT PCR NEGATIVE NEGATIVE Final    Comment: (NOTE) SARS-CoV-2 target nucleic acids are NOT DETECTED.  The SARS-CoV-2 RNA is generally detectable in upper respiratory specimens during the acute phase of infection. The lowest concentration of SARS-CoV-2 viral copies this assay can detect is 138 copies/mL. A negative result does not preclude SARS-Cov-2 infection and should not be used as the sole basis for treatment or other patient management decisions. A negative result may occur with  improper specimen collection/handling,  submission of specimen other than nasopharyngeal swab, presence of viral mutation(s) within the areas targeted by this assay, and inadequate number of viral copies(<138 copies/mL). A negative result must be combined with clinical observations, patient history, and epidemiological information. The expected result is Negative.  Fact Sheet for Patients:  EntrepreneurPulse.com.au  Fact Sheet for Healthcare Providers:  IncredibleEmployment.be  This test is no t yet approved or cleared by the [place] FDA and  has been authorized for detection and/or diagnosis of SARS-CoV-2 by FDA under an Emergency Use Authorization (EUA). This EUA will remain  in effect (meaning this test can be used) for the duration of the COVID-19 declaration under Section 564(b)(1) of the Act, 21 U.S.C.section 360bbb-3(b)(1), unless the authorization is terminated  or revoked sooner.       Influenza A by PCR NEGATIVE NEGATIVE Final   Influenza B by PCR NEGATIVE NEGATIVE Final  Comment: (NOTE) The Xpert Xpress SARS-CoV-2/FLU/RSV plus assay is intended as an aid in the diagnosis of influenza from Nasopharyngeal swab specimens and should not be used as a sole basis for treatment. Nasal washings and aspirates are unacceptable for Xpert Xpress SARS-CoV-2/FLU/RSV testing.  Fact Sheet for Patients: EntrepreneurPulse.com.au  Fact Sheet for Healthcare Providers: IncredibleEmployment.be  This test is not yet approved or cleared by the [place] FDA and has been authorized for detection and/or diagnosis of SARS-CoV-2 by FDA under an Emergency Use Authorization (EUA). This EUA will remain in effect (meaning this test can be used) for the duration of the COVID-19 declaration under Section 564(b)(1) of the Act, 21 U.S.C. section 360bbb-3(b)(1), unless the authorization is terminated or revoked.  Performed at [facility], [address] [place], [place] [address]   MRSA Next Gen by PCR, Nasal     Status: None   Collection Time: 06/21/21  2:13 AM   Specimen: Nasal Mucosa; Nasal Swab  Result Value Ref Range Status   MRSA by PCR Next Gen NOT DETECTED NOT DETECTED Final    Comment: (NOTE) The GeneXpert MRSA Assay (FDA approved for NASAL specimens only), is one component of a comprehensive MRSA colonization surveillance program. It is not intended to diagnose MRSA infection nor to guide or monitor treatment for MRSA infections. Test performance is not FDA approved in patients less than 80 years old. Performed at [facility], [address], [place], [place] [address]   Urine Culture     Status: Abnormal   Collection Time: 06/22/21  6:00 AM   Specimen: Urine, Clean Catch  Result Value Ref Range Status   Specimen Description   Final    URINE, CLEAN CATCH Performed at [facility], [address], [place], [place] [address]    Special Requests   Final    NONE Performed at [facility], [address] [place], [place] [address]    Culture (A)  Final    >=100,000 COLONIES/mL PROTEUS MIRABILIS 60,000 COLONIES/mL PSEUDOMONAS AERUGINOSA    Report Status 06/26/2021 FINAL  Final   Organism ID, Bacteria PROTEUS MIRABILIS (A)  Final   Organism ID, Bacteria PSEUDOMONAS AERUGINOSA (A)  Final      Susceptibility   Pseudomonas aeruginosa - MIC*    CEFTAZIDIME 4 SENSITIVE Sensitive     CIPROFLOXACIN <=0.25 SENSITIVE Sensitive     GENTAMICIN <=1 SENSITIVE Sensitive     IMIPENEM 2 SENSITIVE Sensitive     PIP/TAZO 8 SENSITIVE Sensitive     CEFEPIME 2 SENSITIVE Sensitive     * 60,000 COLONIES/mL PSEUDOMONAS AERUGINOSA   Proteus mirabilis - MIC*    AMPICILLIN <=2 SENSITIVE Sensitive     CEFAZOLIN <=4 SENSITIVE Sensitive     CEFEPIME <=0.12 SENSITIVE Sensitive     CEFTRIAXONE <=0.25 SENSITIVE Sensitive     CIPROFLOXACIN <=0.25 SENSITIVE Sensitive     GENTAMICIN <=1 SENSITIVE Sensitive     IMIPENEM 4 SENSITIVE Sensitive      NITROFURANTOIN 128 RESISTANT Resistant     TRIMETH/SULFA <=20 SENSITIVE Sensitive     AMPICILLIN/SULBACTAM <=2 SENSITIVE Sensitive     PIP/TAZO <=4 SENSITIVE Sensitive     * >=100,000 COLONIES/mL PROTEUS MIRABILIS  Resp Panel by RT-PCR (Flu A&B, Covid) Nasopharyngeal Swab     Status: None   Collection Time: 06/26/21 12:50 PM   Specimen: Nasopharyngeal Swab; Nasopharyngeal(NP) swabs in vial transport medium  Result Value Ref Range Status   SARS Coronavirus 2 by RT PCR NEGATIVE NEGATIVE Final  Comment: (NOTE) SARS-CoV-2 target nucleic acids are NOT DETECTED.  The SARS-CoV-2 RNA is generally detectable in upper respiratory specimens during the acute phase of infection. The lowest concentration of SARS-CoV-2 viral copies this assay can detect is 138 copies/mL. A negative result does not preclude SARS-Cov-2 infection and should not be used as the sole basis for treatment or other patient management decisions. A negative result may occur with  improper specimen collection/handling, submission of specimen other than nasopharyngeal swab, presence of viral mutation(s) within the areas targeted by this assay, and inadequate number of viral copies(<138 copies/mL). A negative result must be combined with clinical observations, patient history, and epidemiological information. The expected result is Negative.  Fact Sheet for Patients:  EntrepreneurPulse.com.au  Fact Sheet for Healthcare Providers:  IncredibleEmployment.be  This test is no t yet approved or cleared by the [place] FDA and  has been authorized for detection and/or diagnosis of SARS-CoV-2 by FDA under an Emergency Use Authorization (EUA). This EUA will remain  in effect (meaning this test can be used) for the duration of the COVID-19 declaration under Section 564(b)(1) of the Act, 21 U.S.C.section 360bbb-3(b)(1), unless the authorization is terminated  or revoked sooner.        Influenza A by PCR NEGATIVE NEGATIVE Final   Influenza B by PCR NEGATIVE NEGATIVE Final    Comment: (NOTE) The Xpert Xpress SARS-CoV-2/FLU/RSV plus assay is intended as an aid in the diagnosis of influenza from Nasopharyngeal swab specimens and should not be used as a sole basis for treatment. Nasal washings and aspirates are unacceptable for Xpert Xpress SARS-CoV-2/FLU/RSV testing.  Fact Sheet for Patients: EntrepreneurPulse.com.au  Fact Sheet for Healthcare Providers: IncredibleEmployment.be  This test is not yet approved or cleared by the [place] FDA and has been authorized for detection and/or diagnosis of SARS-CoV-2 by FDA under an Emergency Use Authorization (EUA). This EUA will remain in effect (meaning this test can be used) for the duration of the COVID-19 declaration under Section 564(b)(1) of the Act, 21 U.S.C. section 360bbb-3(b)(1), unless the authorization is terminated or revoked.  Performed at [facility], [address] [place], [place] [address]      Labs: Basic Metabolic Panel: Recent Labs  Lab 06/20/21 2102 06/21/21 0449 06/22/21 0653 06/23/21 0539 06/24/21 0537  NA 134* 139 141 141 139  K 4.7 3.9 3.9 3.2* 3.4*  CL 91* 97* 104 107 109  CO2 34* 29 27 24 25   GLUCOSE 130* 108* 84 69* 107*  BUN 42* 44* 41* 31* 17  CREATININE 1.80* 1.72* 1.73* 1.29* 0.98  CALCIUM 11.1* 11.0* 9.8 10.0 9.6  MG  --  2.4  --   --  2.0  PHOS  --  4.1  --  2.5  --    Liver Function Tests: Recent Labs  Lab 06/20/21 2102 06/21/21 0449 06/23/21 0539  AST 30 22  --   ALT 22 20  --   ALKPHOS 71 71  --   BILITOT 1.4* 1.3*  --   PROT 8.0 7.1  --   ALBUMIN 4.2 3.9 3.7   Recent Labs  Lab 06/20/21 2102  LIPASE 29   CBC: Recent Labs  Lab 06/20/21 2102 06/21/21 0449 06/22/21 0653 06/24/21 0537  WBC 10.5 9.3 6.0 6.6  NEUTROABS 8.0*  --   --   --   HGB 14.1 13.2 11.3* 11.7*  HCT 40.5 38.6 33.4* 33.8*  MCV 105.5* 106.3*  107.4* 104.3*  PLT 218 188 169 164   CBG:  Recent Labs  Lab 06/25/21 1727 06/26/21 0046 06/26/21 0557 06/26/21 0742 06/26/21 1152  GLUCAP 111* 97 115* 121* 138*    Signed:  Barton Dubois MD.  [facility] 06/26/2021, 2:12 PM

## 2021-06-26 NOTE — Progress Notes (Signed)
  Subjective: Pt notes that she tolerated soft food diet quite well yesterday without any abdominal pain, nausea or vomiting. Adds that she moved her bowels a few times yesterday after she was last seen by our team. Feels that she can tolerate regular diet today.  Objective: Vital signs in last 24 hours: Temp:  [98.2 F (36.8 C)-99.3 F (37.4 C)] 98.2 F (36.8 C) (09/01 0604) Pulse Rate:  [67-84] 84 (09/01 0604) Resp:  [20] 20 (08/31 1417) BP: (143-167)/(52-71) 159/52 (09/01 0604) SpO2:  [96 %-99 %] 96 % (09/01 0604) Last BM Date: 06/25/21  Intake/Output from previous day: 08/31 0701 - 09/01 0700 In: 360 [P.O.:360] Out: 900 [Urine:900] Intake/Output this shift: No intake/output data recorded.  General appearance: alert, cooperative, and no distress Resp: clear to auscultation bilaterally Cardio: regular rate and rhythm, S1, S2 normal, no murmur, click, rub or gallop GI: soft, non-tender; bowel sounds normal; no masses,  no organomegaly  Lab Results:  Recent Labs    06/24/21 0537  WBC 6.6  HGB 11.7*  HCT 33.8*  PLT 164   BMET Recent Labs    06/24/21 0537  NA 139  K 3.4*  CL 109  CO2 25  GLUCOSE 107*  BUN 17  CREATININE 0.98  CALCIUM 9.6   PT/INR No results for input(s): LABPROT, INR in the last 72 hours.  Studies/Results: No results found.  Anti-infectives: Anti-infectives (From admission, onward)    Start     Dose/Rate Route Frequency Ordered Stop   06/25/21 1200  abacavir-dolutegravir-lamiVUDine (TRIUMEQ) 600-50-300 MG per tablet 1 tablet        1 tablet Oral Daily 06/25/21 1053     06/21/21 1800  cefTRIAXone (ROCEPHIN) 1 g in sodium chloride 0.9 % 100 mL IVPB  Status:  Discontinued        1 g 200 mL/hr over 30 Minutes Intravenous Every 24 hours 06/21/21 1700 06/25/21 1051       Assessment/Plan: Impression: Hospital Day 6 for this 80 y/o woman with 8/26 CT-demonstrated proximal small bowel obstruction Tolerated soft food diet very well  yesterday.  Plan: -Advance to regular diet today   LOS: 6 days    Maudell Stanbrough A Lyam Provencio 06/26/2021

## 2021-09-08 ENCOUNTER — Other Ambulatory Visit: Payer: Self-pay | Admitting: General Surgery

## 2021-09-08 DIAGNOSIS — R221 Localized swelling, mass and lump, neck: Secondary | ICD-10-CM

## 2021-12-01 ENCOUNTER — Other Ambulatory Visit: Payer: Self-pay

## 2021-12-01 ENCOUNTER — Emergency Department (HOSPITAL_COMMUNITY): Payer: Medicare Other

## 2021-12-01 ENCOUNTER — Encounter (HOSPITAL_COMMUNITY): Payer: Self-pay | Admitting: Emergency Medicine

## 2021-12-01 ENCOUNTER — Emergency Department (HOSPITAL_COMMUNITY)
Admission: EM | Admit: 2021-12-01 | Discharge: 2021-12-02 | Disposition: A | Discharge status: Home / Self Care | Payer: Medicare Other | Attending: Emergency Medicine | Admitting: Emergency Medicine

## 2021-12-01 DIAGNOSIS — Y92129 Unspecified place in nursing home as the place of occurrence of the external cause: Secondary | ICD-10-CM | POA: Insufficient documentation

## 2021-12-01 DIAGNOSIS — Z21 Asymptomatic human immunodeficiency virus [HIV] infection status: Secondary | ICD-10-CM | POA: Diagnosis not present

## 2021-12-01 DIAGNOSIS — N183 Chronic kidney disease, stage 3 unspecified: Secondary | ICD-10-CM | POA: Diagnosis not present

## 2021-12-01 DIAGNOSIS — M79604 Pain in right leg: Secondary | ICD-10-CM | POA: Insufficient documentation

## 2021-12-01 DIAGNOSIS — W010XXA Fall on same level from slipping, tripping and stumbling without subsequent striking against object, initial encounter: Secondary | ICD-10-CM | POA: Diagnosis not present

## 2021-12-01 DIAGNOSIS — J449 Chronic obstructive pulmonary disease, unspecified: Secondary | ICD-10-CM | POA: Diagnosis not present

## 2021-12-01 DIAGNOSIS — S0990XA Unspecified injury of head, initial encounter: Secondary | ICD-10-CM | POA: Diagnosis not present

## 2021-12-01 DIAGNOSIS — W19XXXA Unspecified fall, initial encounter: Secondary | ICD-10-CM

## 2021-12-01 DIAGNOSIS — I129 Hypertensive chronic kidney disease with stage 1 through stage 4 chronic kidney disease, or unspecified chronic kidney disease: Secondary | ICD-10-CM | POA: Insufficient documentation

## 2021-12-01 NOTE — ED Provider Notes (Signed)
Emergency Department Provider Note   I have reviewed the triage vital signs and the nursing notes.   HISTORY  Chief Complaint Fall   HPI [name] is a 81 y.o. adult who presents to the emergency department for evaluation of fall at her nursing home.  She reportedly tripped and fell.  The fall was witnessed with some head injury.  Patient is complaining of some mild pain in the right leg and the back of the head.  No loss of consciousness. No symptoms prior to falling such as CP or SOB.     Past Medical History:  Diagnosis Date   Anxiety    Arthritis    Bronchitis    Bronchitis    CKD (chronic kidney disease) stage 3, GFR 30-59 ml/min ([place]) 9/37/9024   Complication of anesthesia    pt states she had a cardiac arrest during hysterectomy 1985.  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) 03/12/2021   Nausea 03/03/2017   Peripheral neuropathy    bilateral feet   Seasonal allergies 03/27/2019   Sinusitis 05/06/2018   UTI (urinary tract infection) 03/03/2017   Weight loss 03/03/2017    Review of Systems  Constitutional: No fever/chills Cardiovascular: Denies chest pain. Respiratory: Denies shortness of breath. Gastrointestinal: No abdominal pain.   Musculoskeletal: Left leg pain.  Skin: Negative for rash. Neurological: Positive HA.   ____________________________________________   PHYSICAL EXAM:  VITAL SIGNS: ED Triage Vitals  Enc Vitals Group     BP 12/01/21 1915 (!) 131/47     Pulse Rate 12/01/21 1915 60     Resp 12/01/21 1915 18     Temp  12/01/21 1915 98 F (36.7 C)     Temp Source 12/01/21 1915 Oral     SpO2 12/01/21 1915 95 %     Weight 12/01/21 1903 159 lb 6.3 oz (72.3 kg)     Height 12/01/21 1903 5\' 2"  (1.575 m)    Constitutional: Alert and oriented. Well appearing and in no acute distress. Eyes: Conjunctivae are normal.  Head: Atraumatic. Nose: No congestion/rhinnorhea. Mouth/Throat: Mucous membranes are moist.   Neck: No stridor.  No cervical spine tenderness to palpation. Cardiovascular: Normal rate, regular rhythm. Good peripheral circulation. Grossly normal heart sounds.   Respiratory: Normal respiratory effort.  No retractions. Lungs CTAB. Gastrointestinal: Soft and nontender. No distention.  Musculoskeletal: No lower extremity tenderness nor edema. No gross deformities of extremities. Normal passive ROM of the bilateral hips and knees.  Neurologic:  Normal speech and language. No gross focal neurologic deficits are appreciated.  Skin:  Skin is warm, dry and intact. No rash noted.  ____________________________________________   PROCEDURES  Procedure(s) performed:   Procedures  None ____________________________________________   INITIAL IMPRESSION / ASSESSMENT AND PLAN / ED COURSE  Pertinent labs & imaging results that were available during my care of the patient were reviewed by me and considered in my medical decision making (see chart for details).   This patient is Presenting for Evaluation of fall, which does  require a range of treatment options, and is a complaint that involves a high risk of morbidity and mortality.  The Differential Diagnoses include SDH, SAH, hip fracture, knee fracture.   I did obtain Additional Historical Information from EMS.  I decided to review pertinent External Data, and in summary patient's last admit was 06/2021.   Radiologic Tests Ordered, included CT head and c-spine along with plain films of the right hip/knee discomfort. I independently interpreted the  images and agree with radiology interpretation.   Cardiac Monitor Tracing which shows NSR.   Medical Decision Making: Summary:  Patient presents to the emergency department for evaluation after mechanical fall at her nursing facility.  No focal deficits on exam.  CT imaging of the head and C-spine independently evaluated as above.  No acute findings.  No fracture/dislocation appreciated on plain films.  Patient is at her mental status baseline.  Plan for return to the nursing facility.  Disposition: discharge  ____________________________________________  FINAL CLINICAL IMPRESSION(S) / ED DIAGNOSES  Final diagnoses:  Fall, initial encounter  Injury of head, initial encounter  Right leg pain     Note:  This document was prepared using Dragon voice recognition software and may include unintentional dictation errors.  Nanda Quinton, MD, [facility] Emergency Medicine    [name], [facility], MD 12/10/21 [phone]

## 2021-12-01 NOTE — Discharge Instructions (Signed)
You were seen in the emergency room today after a fall.  Your CT scan of the head and plain films of the leg are normal and do not show any broken bones or bleeding.  Please follow closely with your primary care doctor.  May take Tylenol as needed for pain.

## 2021-12-01 NOTE — ED Notes (Signed)
Notified [facility] of patient needing transportation back to [facility].

## 2021-12-01 NOTE — ED Notes (Signed)
Attempted to give report to [facility] 2 times. No answer at this time

## 2021-12-01 NOTE — ED Triage Notes (Signed)
Pt states she tripped and fell at nursing home. Pt has no complaints.

## 2021-12-01 NOTE — ED Notes (Signed)
Called to give report to [facility] again. Transferred to pt's hall but there was no answer at this time. This will be the 3rd time this RN has tried to give report to facility

## 2021-12-02 NOTE — ED Notes (Signed)
Patient dried. Pt repositioned

## 2021-12-02 NOTE — ED Notes (Signed)
RN called to give report to facility; Lynelle Smoke, RN

## 2021-12-02 NOTE — ED Notes (Signed)
EMS left pt shoes. EMS was called and stated they would return to get shoes and take to the facility

## 2021-12-23 ENCOUNTER — Ambulatory Visit: Payer: Medicare Other | Admitting: Physician Assistant

## 2021-12-23 ENCOUNTER — Encounter: Payer: Self-pay | Admitting: Urology

## 2021-12-23 ENCOUNTER — Other Ambulatory Visit: Payer: Self-pay

## 2021-12-23 ENCOUNTER — Ambulatory Visit (INDEPENDENT_AMBULATORY_CARE_PROVIDER_SITE_OTHER): Payer: Medicare Other | Admitting: Urology

## 2021-12-23 DIAGNOSIS — N390 Urinary tract infection, site not specified: Secondary | ICD-10-CM

## 2021-12-23 LAB — URINALYSIS, ROUTINE W REFLEX MICROSCOPIC
Bilirubin, UA: NEGATIVE
Glucose, UA: NEGATIVE
Ketones, UA: NEGATIVE
Nitrite, UA: POSITIVE — AB
Specific Gravity, UA: 1.015 (ref 1.005–1.030)
Urobilinogen, Ur: 0.2 mg/dL (ref 0.2–1.0)
pH, UA: 8.5 — ABNORMAL HIGH (ref 5.0–7.5)

## 2021-12-23 LAB — BLADDER SCAN AMB NON-IMAGING: Scan Result: 1

## 2021-12-23 LAB — MICROSCOPIC EXAMINATION: Renal Epithel, UA: NONE SEEN /hpf

## 2021-12-23 MED ORDER — NITROFURANTOIN MONOHYD MACRO 100 MG PO CAPS: 100.0000 mg | ORAL_CAPSULE | Freq: Two times a day (BID) | ORAL | 0 refills | Status: DC

## 2021-12-23 MED ORDER — MIRABEGRON ER 25 MG PO TB24: 25.0000 mg | ORAL_TABLET | Freq: Every day | ORAL | 0 refills | Status: DC

## 2021-12-23 NOTE — Patient Instructions (Signed)

## 2021-12-23 NOTE — Progress Notes (Signed)
post void residual=1 

## 2021-12-23 NOTE — Progress Notes (Signed)
12/23/2021 10:56 AM   [name] 24-Aug-1941 [id number]  Referring provider: Wannetta Sender, FNP [facility] [address] [place],  [place] [address]  Recurrent UTI   HPI: [name] is a 81yo here for evaluation of recurrent UTI. She has had 5-7 UTIs per year for the past 3 years. She was treated for a UTI 2-3 weeks ago. She does not recall the antibiotic used.  She has urinary incontinence and uses depends which are soaked every 2 hours. She currently has dysuria, urinary frequency and urgency. She she had painful gross hematuria 1 month ago. No numbness/tingling in fingers/toes.No other complaints.    PMH: Past Medical History:  Diagnosis Date   Anxiety    Arthritis    Bronchitis    Bronchitis    CKD (chronic kidney disease) stage 3, GFR 30-59 ml/min ([facility]) 8/52/7782   Complication of anesthesia    pt states she had a cardiac arrest during hysterectomy 1985.  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 [facility])    pt states she has to have her creatinine level checked often    Moderate single current episode of major depressive disorder ([place]) 03/12/2021   Nausea 03/03/2017   Peripheral neuropathy    bilateral feet   Seasonal allergies 03/27/2019   Sinusitis 05/06/2018   UTI (urinary tract infection) 03/03/2017   Weight loss 03/03/2017    Surgical History: Past Surgical History:  Procedure Laterality Date   ABDOMINAL HYSTERECTOMY  1985   BACK SURGERY     BIOPSY  07/15/2020   Procedure: BIOPSY;  Surgeon: Daneil Dolin, MD;  [facility];  Service: Endoscopy;;   CATARACT EXTRACTION, BILATERAL     CHOLECYSTECTOMY     COLONOSCOPY  08/10/2002    NUR: Normal colonoscopy except for some hemorrhoids and some erythema at the dentate line   COLONOSCOPY  12/10/2006   Edwards:Diffuse colitis in the transverse and descending colon/This is not entirely typical of ischemic colitis or her HIV positive/ disease.  We need to be concerned about other causes   COLONOSCOPY  01/19/2011   RMR: Internal and external hemorrhoids, likely source of hematochezia anal papilla, otherwise normal rectal mucosa/ Left-sided diverticula.  Cecal polyp with status post cold snare polypectomy.  Remainder of colonic mucosa appeared normal. Pathology with tubular adenoma. Repeat in 2017   COLONOSCOPY WITH PROPOFOL N/A 07/15/2020   Procedure: COLONOSCOPY WITH PROPOFOL;  Surgeon: Daneil Dolin, MD;  Location: [facility];  Service: Endoscopy;  Laterality: N/A;  8:15AM TCS via Ostomy   COLOSTOMY N/A 11/12/2018   Procedure: COLOSTOMY;  Surgeon: Virl Cagey, MD;  Location: [facility] ORS;  Service: General;  Laterality: N/A;   COLOSTOMY REVERSAL N/A 10/02/2020   Procedure: COLOSTOMY REVERSAL;  Surgeon: Virl Cagey, MD;  Location: [facility] ORS;  Service: General;  Laterality: N/A;   ESOPHAGOGASTRODUODENOSCOPY  08/10/2002     NUR: Mild changes of reflux esophagitis limited to gastroesophageal junction  No evidence of ring, stricture, or esophageal candidiasis dysphagia/ Gastritis, possibly H. pylori induced   ESOPHAGOGASTRODUODENOSCOPY  12/25/2002   NUR: Erosive antral gastritis.  The degree of  gastritis is more significant than her last exam/ Normal examination of the esophagus/ Esophageal dilatation performed by passing 61 and [address] [place]   ESOPHAGOGASTRODUODENOSCOPY  11/13/2010   RMR: Circumferential distal esophageal erosions with soft peptic stricture, consistent with erosive reflux esophagitis or stricture  formation, status post dilation as described above/  Small hiatal hernia/ Tiny antral erosions, otherwise normal stomach D1 and D2   ESOPHAGOGASTRODUODENOSCOPY  (EGD) WITH PROPOFOL N/A 07/15/2020   Procedure: ESOPHAGOGASTRODUODENOSCOPY (EGD) WITH PROPOFOL;  Surgeon: Daneil Dolin, MD;  [facility];  Service: Endoscopy;  Laterality: N/A;   FLEXIBLE SIGMOIDOSCOPY N/A 07/15/2020   Procedure: FLEXIBLE SIGMOIDOSCOPY;  Surgeon: Daneil Dolin, MD;  [facility];  Service: Endoscopy;  Laterality: N/A;   HEMORRHOID SURGERY  09/02/2012   Procedure: HEMORRHOIDECTOMY;  Surgeon: Jamesetta So, MD;  Location: [facility];  Service: General;  Laterality: N/A;   LAPAROTOMY N/A 11/12/2018   Procedure: EXPLORATORY LAPAROTOMY;  Surgeon: Virl Cagey, MD;  Location: [facility];  Service: General;  Laterality: N/A;   MALONEY DILATION N/A 07/15/2020   Procedure: Keturah Shavers;  Surgeon: Daneil Dolin, MD;  [facility];  Service: Endoscopy;  Laterality: N/A;   PARTIAL COLECTOMY N/A 11/12/2018   Procedure: PARTIAL COLECTOMY;  Surgeon: Virl Cagey, MD;  Location: [facility];  Service: General;  Laterality: N/A;   POLYPECTOMY  07/15/2020   Procedure: POLYPECTOMY;  Surgeon: Daneil Dolin, MD;  [facility];  Service: Endoscopy;;   TUBAL LIGATION      Home Medications:  Allergies as of 12/23/2021       Reactions   Bee Venom Anaphylaxis   Promethazine Swelling   Tenofovir Disoproxil Fumarate [tenofovir Disoproxil Fumarate] Other (See Comments)   Renal failure, (08/27/2006)   Compazine  [prochlorperazine Edisylate] Nausea And Vomiting   Haloperidol Lactate Other (See Comments)   Reaction is muscle tension, causes severe spasms in face and neck. Forces eyes to roll in the back of the head   Sulfa Antibiotics Diarrhea   Brompheniramine-acetaminophen    Chlorcyclizine    Chlorpromazine    Codeine Itching, Nausea And Vomiting   Navane [thiothixene] Other (See Comments), Nausea And Vomiting   Same muscle spasm reaction as haldol   Prednisone    Brief mild psychosis and agitation   Promethazine Hcl Other (See Comments)    Propoxyphene N-acetaminophen Itching, Nausea And Vomiting   Morphine Itching, Swelling        Medication List        Accurate as of December 23, 2021 10:56 AM. If you have any questions, ask your nurse or doctor.          acetaminophen 500 MG tablet Commonly known as: TYLENOL Take 2 tablets (1,000 mg total) by mouth every 6 (six) hours as needed for mild pain or moderate pain.   albuterol 108 (90 Base) MCG/ACT inhaler Commonly known as: VENTOLIN HFA Inhale 1-2 puffs into the lungs every 6 (six) hours as needed for wheezing or shortness of breath.   Allergy Relief 10 MG tablet Generic drug: loratadine Take 10 mg by mouth daily.   ARIPiprazole 10 MG tablet Commonly known as: ABILIFY Take 10 mg by mouth daily.   atorvastatin 20 MG tablet Commonly known as: LIPITOR Take 20 mg by mouth daily.   clonazePAM 1 MG tablet Commonly known as: KLONOPIN Take 1 tablet (1 mg total) by mouth 2 (two) times daily as needed for anxiety.   docusate sodium 100  MG capsule Commonly known as: COLACE Take 1 capsule (100 mg total) by mouth 2 (two) times daily.   DULoxetine 60 MG capsule Commonly known as: CYMBALTA Take 30 mg by mouth daily.   fluticasone 50 MCG/ACT nasal spray Commonly known as: FLONASE 2 SPRAYS INTO BOTH NOSTRILS AT BEDTIME. What changed: See the new instructions.   furosemide 40 MG tablet Commonly known as: LASIX Take 1 tablet (40 mg total) by mouth daily as needed for fluid. Hold until follow up with PCP What changed: additional instructions   hydrOXYzine 25 MG tablet Commonly known as: ATARAX Take 25 mg by mouth 2 (two) times daily.   labetalol 100 MG tablet Commonly known as: NORMODYNE Take 1 tablet (100 mg total) by mouth 2 (two) times daily.   losartan 25 MG tablet Commonly known as: COZAAR Take 12.5 mg by mouth daily.   Magnesium 100 MG Tabs Take 50 mg by mouth daily.   mirtazapine 7.5 MG tablet Commonly known as: REMERON Take 15 mg by mouth at  bedtime. For appetite/depression   mirtazapine 30 MG tablet Commonly known as: REMERON Take 30 mg by mouth at bedtime.   mirtazapine 15 MG tablet Commonly known as: REMERON Take 15 mg by mouth at bedtime.   ONE-A-DAY 55 PLUS PO Take 1 tablet by mouth daily.   oxyCODONE 5 MG immediate release tablet Commonly known as: Oxy IR/ROXICODONE Take 5 mg by mouth 2 (two) times daily as needed.   pantoprazole 40 MG tablet Commonly known as: Protonix Take 1 tablet (40 mg total) by mouth daily before breakfast.   polyethylene glycol 17 g packet Commonly known as: MiraLax Take 17 g by mouth daily. Hold for diarrhea   polyvinyl alcohol 1.4 % ophthalmic solution Commonly known as: LIQUIFILM TEARS Place 1 drop into both eyes daily.   thiamine 100 MG tablet Commonly known as: Vitamin B-1 Take 100 mg by mouth daily.   Triumeq 600-50-300 MG tablet Generic drug: abacavir-dolutegravir-lamiVUDine Take 1 tablet by mouth daily.        Allergies:  Allergies  Allergen Reactions   Bee Venom Anaphylaxis   Promethazine Swelling   Tenofovir Disoproxil Fumarate [Tenofovir Disoproxil Fumarate] Other (See Comments)    Renal failure, (08/27/2006)   Compazine  [Prochlorperazine Edisylate] Nausea And Vomiting   Haloperidol Lactate Other (See Comments)    Reaction is muscle tension, causes severe spasms in face and neck. Forces eyes to roll in the back of the head   Sulfa Antibiotics Diarrhea   Brompheniramine-Acetaminophen    Chlorcyclizine    Chlorpromazine    Codeine Itching and Nausea And Vomiting   Navane [Thiothixene] Other (See Comments) and Nausea And Vomiting    Same muscle spasm reaction as haldol   Prednisone     Brief mild psychosis and agitation   Promethazine Hcl Other (See Comments)   Propoxyphene N-Acetaminophen Itching and Nausea And Vomiting   Morphine Itching and Swelling    Family History: Family History  Problem Relation Age of Onset   Diabetes Mother    Diabetes  Brother    Colon cancer Brother        Diagnosed > age 14    Social History:  reports that she has never smoked. She has never used smokeless tobacco. She reports that she does not drink alcohol and does not use drugs.  ROS: All other review of systems were reviewed and are negative except what is noted above in HPI  Physical Exam: There were no vitals taken for  this visit.  Constitutional:  Alert and oriented, No acute distress. HEENT: [place] AT, moist mucus membranes.  Trachea midline, no masses. Cardiovascular: No clubbing, cyanosis, or edema. Respiratory: Normal respiratory effort, no increased work of breathing. GI: Abdomen is soft, nontender, nondistended, no abdominal masses GU: No CVA tenderness.  Lymph: No cervical or inguinal lymphadenopathy. Skin: No rashes, bruises or suspicious lesions. Neurologic: Grossly intact, no focal deficits, moving all 4 extremities. Psychiatric: Normal mood and affect.  Laboratory Data: Lab Results  Component Value Date   WBC 6.6 06/24/2021   HGB 11.7 (L) 06/24/2021   HCT 33.8 (L) 06/24/2021   MCV 104.3 (H) 06/24/2021   PLT 164 06/24/2021    Lab Results  Component Value Date   CREATININE 0.98 06/24/2021    No results found for: PSA  No results found for: TESTOSTERONE  Lab Results  Component Value Date   HGBA1C 4.9 09/30/2020    Urinalysis    Component Value Date/Time   COLORURINE YELLOW 06/20/2021 2229   APPEARANCEUR CLOUDY (A) 06/20/2021 2229   APPEARANCEUR Turbid (A) 11/21/2020 1430   LABSPEC 1.018 06/20/2021 2229   PHURINE 8.0 06/20/2021 2229   GLUCOSEU NEGATIVE 06/20/2021 2229   GLUCOSEU NEG mg/dL 09/20/2007 1114   HGBUR NEGATIVE 06/20/2021 2229   HGBUR moderate 06/04/2010 1425   BILIRUBINUR NEGATIVE 06/20/2021 2229   BILIRUBINUR Negative 11/21/2020 1430   KETONESUR NEGATIVE 06/20/2021 2229   PROTEINUR 100 (A) 06/20/2021 2229   UROBILINOGEN 1.0 05/31/2014 0933   NITRITE NEGATIVE 06/20/2021 2229   LEUKOCYTESUR  LARGE (A) 06/20/2021 2229    Lab Results  Component Value Date   LABMICR See below: 11/21/2020   WBCUA >30 (A) 11/21/2020   LABEPIT >10 (A) 11/21/2020   BACTERIA FEW (A) 06/20/2021    Pertinent Imaging:  Results for orders placed during the hospital encounter of 06/20/21  DG Abd 1 View  Narrative CLINICAL DATA:  Nasogastric tube.  EXAM: ABDOMEN - 1 VIEW  COMPARISON:  None.  FINDINGS: The bowel gas pattern is normal. Distal tip of nasogastric tube is seen in the stomach. Status post cholecystectomy. No radio-opaque calculi or other significant radiographic abnormality are seen.  IMPRESSION: Distal tip of nasogastric tube seen in stomach.   Electronically Signed By: Marijo Conception M.D. On: 06/22/2021 08:09  No results found for this or any previous visit.  No results found for this or any previous visit.  No results found for this or any previous visit.  Results for orders placed during the hospital encounter of 08/10/06  US Renal  Narrative Clinical Data: 81 year old with rising creatinine. RENAL/URINARY TRACT ULTRASOUND: Technique: Complete ultrasound examination of the urinary tract was performed including evaluation of the kidneys, renal collecting systems, and urinary bladder. Comparison: None. Findings: The right kidney measures 10.1 cm and the left kidney measures 12.2 cm. Left kidney demonstrates increased echogenicity of the renal parenchyma and mild caliectasis, but no obvious hydronephrosis. No renal masses or perinephric fluid collections. Bladder is unremarkable.  Impression Echogenic left kidney with mild caliectasis but no obvious hydronephrosis. The right kidney appears normal.  Provider: Teena Dunk  No results found for this or any previous visit.  No results found for this or any previous visit.  No results found for this or any previous visit.   Assessment & Plan:    1. Recurrent UTI -urine culure - Urinalysis, Routine w  reflex microscopic - BLADDER SCAN AMB NON-IMAGING  2. Urinary incontinence -we will trial mirabegorn 25mg  daily   No  follow-ups on file.  Nicolette Bang, MD  [facility]rology [place]

## 2021-12-29 ENCOUNTER — Telehealth: Payer: Self-pay

## 2021-12-29 LAB — URINE CULTURE

## 2021-12-30 NOTE — Telephone Encounter (Signed)
Received call from patients nurse Elyse Hsu) from [facility] stating they don't know much about her allergy to Bactrim.   ?Nurse states that you can try Bactrim at your discretion or proceed with an IM abt. ?Orders can be faxed to [phone].  ?Please advise. ? ?

## 2021-12-30 NOTE — Telephone Encounter (Signed)
-----   Message from Reynaldo Minium, [place] sent at 12/29/2021  3:28 PM EST ----- ?Patient's culture indicates 2 separate bacteria and 1 is resistant to Macrobid.  Bactrim DS would cover both bacteria, however patient states allergy to Bactrim DS thing in diarrhea.  I can send in either Bactrim or fosfomycin as a last p.o. option.  The only other medications that would treat both bacteria are either IV or IM antibiotics.  Please ascertain as to the extent of side effects when taking Bactrim. I will send in Rx pending pt's concerns with Bactrim. ?----- Message ----- ?From: Dorisann Frames, RN ?Sent: 12/29/2021   9:11 AM EST ?To: Cleon Gustin, MD, # ? ?Placed on macrobid 02/28- please advise ? ?

## 2021-12-31 MED ORDER — SULFAMETHOXAZOLE-TRIMETHOPRIM 800-160 MG PO TABS: 1.0000 | ORAL_TABLET | Freq: Two times a day (BID) | ORAL | 0 refills | Status: DC

## 2021-12-31 NOTE — Telephone Encounter (Signed)
Rx faxed

## 2022-01-05 ENCOUNTER — Emergency Department (HOSPITAL_COMMUNITY): Payer: Medicare Other

## 2022-01-05 ENCOUNTER — Encounter: Payer: Self-pay | Admitting: Infectious Disease

## 2022-01-05 ENCOUNTER — Other Ambulatory Visit: Payer: Self-pay

## 2022-01-05 ENCOUNTER — Inpatient Hospital Stay (HOSPITAL_COMMUNITY)
Admission: EM | Admit: 2022-01-05 | Discharge: 2022-01-12 | DRG: 690 | Disposition: A | Discharge status: Skilled Nursing Facility | Payer: Medicare Other | Attending: Internal Medicine | Admitting: Internal Medicine

## 2022-01-05 ENCOUNTER — Ambulatory Visit (INDEPENDENT_AMBULATORY_CARE_PROVIDER_SITE_OTHER): Payer: Medicare Other | Admitting: Infectious Disease

## 2022-01-05 ENCOUNTER — Encounter (HOSPITAL_COMMUNITY): Payer: Self-pay | Admitting: Internal Medicine

## 2022-01-05 VITALS — BP 92/58 | HR 71 | Resp 16 | Ht 62.0 in | Wt 159.0 lb

## 2022-01-05 DIAGNOSIS — Z79899 Other long term (current) drug therapy: Secondary | ICD-10-CM

## 2022-01-05 DIAGNOSIS — I959 Hypotension, unspecified: Secondary | ICD-10-CM

## 2022-01-05 DIAGNOSIS — F0393 Unspecified dementia, unspecified severity, with mood disturbance: Secondary | ICD-10-CM | POA: Diagnosis present

## 2022-01-05 DIAGNOSIS — B964 Proteus (mirabilis) (morganii) as the cause of diseases classified elsewhere: Secondary | ICD-10-CM | POA: Diagnosis present

## 2022-01-05 DIAGNOSIS — N179 Acute kidney failure, unspecified: Secondary | ICD-10-CM | POA: Diagnosis present

## 2022-01-05 DIAGNOSIS — N3000 Acute cystitis without hematuria: Secondary | ICD-10-CM | POA: Diagnosis present

## 2022-01-05 DIAGNOSIS — Z885 Allergy status to narcotic agent status: Secondary | ICD-10-CM | POA: Diagnosis not present

## 2022-01-05 DIAGNOSIS — Z1612 Extended spectrum beta lactamase (ESBL) resistance: Secondary | ICD-10-CM | POA: Diagnosis present

## 2022-01-05 DIAGNOSIS — N3001 Acute cystitis with hematuria: Secondary | ICD-10-CM | POA: Diagnosis present

## 2022-01-05 DIAGNOSIS — R300 Dysuria: Secondary | ICD-10-CM

## 2022-01-05 DIAGNOSIS — Z87440 Personal history of urinary (tract) infections: Secondary | ICD-10-CM

## 2022-01-05 DIAGNOSIS — Z888 Allergy status to other drugs, medicaments and biological substances status: Secondary | ICD-10-CM | POA: Diagnosis not present

## 2022-01-05 DIAGNOSIS — J449 Chronic obstructive pulmonary disease, unspecified: Secondary | ICD-10-CM | POA: Diagnosis present

## 2022-01-05 DIAGNOSIS — Z91030 Bee allergy status: Secondary | ICD-10-CM

## 2022-01-05 DIAGNOSIS — Z20822 Contact with and (suspected) exposure to covid-19: Secondary | ICD-10-CM | POA: Diagnosis present

## 2022-01-05 DIAGNOSIS — Z9049 Acquired absence of other specified parts of digestive tract: Secondary | ICD-10-CM | POA: Diagnosis not present

## 2022-01-05 DIAGNOSIS — I129 Hypertensive chronic kidney disease with stage 1 through stage 4 chronic kidney disease, or unspecified chronic kidney disease: Secondary | ICD-10-CM | POA: Diagnosis present

## 2022-01-05 DIAGNOSIS — I9589 Other hypotension: Secondary | ICD-10-CM | POA: Diagnosis not present

## 2022-01-05 DIAGNOSIS — R42 Dizziness and giddiness: Secondary | ICD-10-CM | POA: Diagnosis not present

## 2022-01-05 DIAGNOSIS — A498 Other bacterial infections of unspecified site: Secondary | ICD-10-CM | POA: Diagnosis present

## 2022-01-05 DIAGNOSIS — N1832 Chronic kidney disease, stage 3b: Secondary | ICD-10-CM | POA: Diagnosis present

## 2022-01-05 DIAGNOSIS — Z833 Family history of diabetes mellitus: Secondary | ICD-10-CM

## 2022-01-05 DIAGNOSIS — Z1624 Resistance to multiple antibiotics: Secondary | ICD-10-CM | POA: Diagnosis present

## 2022-01-05 DIAGNOSIS — R410 Disorientation, unspecified: Secondary | ICD-10-CM

## 2022-01-05 DIAGNOSIS — E785 Hyperlipidemia, unspecified: Secondary | ICD-10-CM | POA: Diagnosis present

## 2022-01-05 DIAGNOSIS — Z8674 Personal history of sudden cardiac arrest: Secondary | ICD-10-CM

## 2022-01-05 DIAGNOSIS — B20 Human immunodeficiency virus [HIV] disease: Secondary | ICD-10-CM

## 2022-01-05 DIAGNOSIS — M1990 Unspecified osteoarthritis, unspecified site: Secondary | ICD-10-CM | POA: Diagnosis present

## 2022-01-05 DIAGNOSIS — Z800 Family history of malignant neoplasm of digestive organs: Secondary | ICD-10-CM | POA: Diagnosis not present

## 2022-01-05 DIAGNOSIS — Z21 Asymptomatic human immunodeficiency virus [HIV] infection status: Secondary | ICD-10-CM | POA: Diagnosis present

## 2022-01-05 DIAGNOSIS — G629 Polyneuropathy, unspecified: Secondary | ICD-10-CM | POA: Diagnosis present

## 2022-01-05 DIAGNOSIS — E861 Hypovolemia: Secondary | ICD-10-CM | POA: Diagnosis present

## 2022-01-05 DIAGNOSIS — F0390 Unspecified dementia without behavioral disturbance: Secondary | ICD-10-CM

## 2022-01-05 DIAGNOSIS — I951 Orthostatic hypotension: Secondary | ICD-10-CM | POA: Diagnosis present

## 2022-01-05 DIAGNOSIS — E860 Dehydration: Secondary | ICD-10-CM | POA: Diagnosis present

## 2022-01-05 DIAGNOSIS — I10 Essential (primary) hypertension: Secondary | ICD-10-CM | POA: Diagnosis present

## 2022-01-05 DIAGNOSIS — F0394 Unspecified dementia, unspecified severity, with anxiety: Secondary | ICD-10-CM | POA: Diagnosis present

## 2022-01-05 DIAGNOSIS — Z882 Allergy status to sulfonamides status: Secondary | ICD-10-CM | POA: Diagnosis not present

## 2022-01-05 DIAGNOSIS — Z8673 Personal history of transient ischemic attack (TIA), and cerebral infarction without residual deficits: Secondary | ICD-10-CM | POA: Diagnosis not present

## 2022-01-05 HISTORY — DX: Unspecified dementia, unspecified severity, without behavioral disturbance, psychotic disturbance, mood disturbance, and anxiety: F03.90

## 2022-01-05 HISTORY — DX: Hypotension, unspecified: I95.9

## 2022-01-05 HISTORY — DX: Dizziness and giddiness: R42

## 2022-01-05 HISTORY — DX: Disorientation, unspecified: R41.0

## 2022-01-05 LAB — CBC WITH DIFFERENTIAL/PLATELET
Abs Immature Granulocytes: 0.01 10*3/uL (ref 0.00–0.07)
Basophils Absolute: 0 10*3/uL (ref 0.0–0.1)
Basophils Relative: 1 %
Eosinophils Absolute: 0.2 10*3/uL (ref 0.0–0.5)
Eosinophils Relative: 3 %
HCT: 38.9 % (ref 36.0–46.0)
Hemoglobin: 13 g/dL (ref 12.0–15.0)
Immature Granulocytes: 0 %
Lymphocytes Relative: 43 %
Lymphs Abs: 2.5 10*3/uL (ref 0.7–4.0)
MCH: 33.4 pg (ref 26.0–34.0)
MCHC: 33.4 g/dL (ref 30.0–36.0)
MCV: 100 fL (ref 80.0–100.0)
Monocytes Absolute: 0.4 10*3/uL (ref 0.1–1.0)
Monocytes Relative: 8 %
Neutro Abs: 2.6 10*3/uL (ref 1.7–7.7)
Neutrophils Relative %: 45 %
Platelets: 168 10*3/uL (ref 150–400)
RBC: 3.89 MIL/uL (ref 3.87–5.11)
RDW: 13.2 % (ref 11.5–15.5)
WBC: 5.7 10*3/uL (ref 4.0–10.5)
nRBC: 0 % (ref 0.0–0.2)

## 2022-01-05 LAB — URINALYSIS, ROUTINE W REFLEX MICROSCOPIC
Bilirubin Urine: NEGATIVE
Glucose, UA: NEGATIVE mg/dL
Hgb urine dipstick: NEGATIVE
Ketones, ur: NEGATIVE mg/dL
Nitrite: POSITIVE — AB
Protein, ur: 100 mg/dL — AB
Specific Gravity, Urine: 1.02 (ref 1.005–1.030)
WBC, UA: >50 WBC/hpf — ABNORMAL HIGH (ref 0–5)
pH: 7 (ref 5.0–8.0)

## 2022-01-05 LAB — RESP PANEL BY RT-PCR (FLU A&B, COVID) ARPGX2
Influenza A by PCR: NEGATIVE
Influenza B by PCR: NEGATIVE
SARS Coronavirus 2 by RT PCR: NEGATIVE

## 2022-01-05 LAB — BASIC METABOLIC PANEL
Anion gap: 7 (ref 5–15)
BUN: 32 mg/dL — ABNORMAL HIGH (ref 8–23)
CO2: 23 mmol/L (ref 22–32)
Calcium: 10.1 mg/dL (ref 8.9–10.3)
Chloride: 112 mmol/L — ABNORMAL HIGH (ref 98–111)
Creatinine, Ser: 1.84 mg/dL — ABNORMAL HIGH (ref 0.44–1.00)
GFR, Estimated: 27 mL/min — ABNORMAL LOW (ref >60)
Glucose, Bld: 109 mg/dL — ABNORMAL HIGH (ref 70–99)
Potassium: 5 mmol/L (ref 3.5–5.1)
Sodium: 142 mmol/L (ref 135–145)

## 2022-01-05 LAB — LACTIC ACID, PLASMA: Lactic Acid, Venous: 0.9 mmol/L (ref 0.5–1.9)

## 2022-01-05 MED ORDER — VANCOMYCIN HCL IN DEXTROSE 1-5 GM/200ML-% IV SOLN: 1000.0000 mg | INTRAVENOUS | Status: DC

## 2022-01-05 MED ORDER — MIRTAZAPINE 15 MG PO TABS
15.0000 mg | ORAL_TABLET | Freq: Every day | ORAL | Status: DC
  Administered 2022-01-06 – 2022-01-11 (×7): 15 mg via ORAL
  Filled 2022-01-05 (×7): qty 1

## 2022-01-05 MED ORDER — ACETAMINOPHEN 650 MG RE SUPP: 650.0000 mg | Freq: Four times a day (QID) | RECTAL | Status: DC | PRN

## 2022-01-05 MED ORDER — LACTATED RINGERS IV SOLN: INTRAVENOUS | Status: AC

## 2022-01-05 MED ORDER — CLONAZEPAM 0.5 MG PO TABS
1.0000 mg | ORAL_TABLET | Freq: Two times a day (BID) | ORAL | Status: DC | PRN
  Administered 2022-01-05 – 2022-01-10 (×4): 1 mg via ORAL
  Filled 2022-01-05 (×4): qty 2

## 2022-01-05 MED ORDER — SODIUM CHLORIDE 0.9 % IV SOLN
500.0000 mg | Freq: Two times a day (BID) | INTRAVENOUS | Status: AC
  Administered 2022-01-05 – 2022-01-07 (×5): 500 mg via INTRAVENOUS
  Filled 2022-01-05 (×6): qty 10

## 2022-01-05 MED ORDER — VANCOMYCIN HCL 1500 MG/300ML IV SOLN
1500.0000 mg | Freq: Once | INTRAVENOUS | Status: AC
  Administered 2022-01-05: 1500 mg via INTRAVENOUS
  Filled 2022-01-05: qty 300

## 2022-01-05 MED ORDER — ACETAMINOPHEN 325 MG PO TABS
650.0000 mg | ORAL_TABLET | Freq: Four times a day (QID) | ORAL | Status: DC | PRN
  Administered 2022-01-09: 650 mg via ORAL
  Filled 2022-01-05: qty 2

## 2022-01-05 MED ORDER — LACTATED RINGERS IV BOLUS
1000.0000 mL | Freq: Once | INTRAVENOUS | Status: AC
  Administered 2022-01-05: 1000 mL via INTRAVENOUS

## 2022-01-05 NOTE — ED Notes (Signed)
Help get patient on the monitor patient is resting with call bell in reach 

## 2022-01-05 NOTE — Progress Notes (Signed)
Pharmacy Antibiotic Note ? ?[name] is a 81 y.o. adult admitted on 01/05/2022 with UTI.  Pharmacy has been consulted for meropenem and vancomycin dosing. ? ? ?Plan: ?Start meropenem '500mg'$  IV q12h ?Load vancomycin '1500mg'$  IV x 1, followed by ?Vancomycin '1000mg'$  IV q48h ? ?  ? ?Temp (24hrs), Avg:97.7 ?F (36.5 ?C), Min:97.7 ?F (36.5 ?C), Max:97.7 ?F (36.5 ?C) ? ?Recent Labs  ?Lab 01/05/22 ?1353  ?WBC 5.7  ?CREATININE 1.84*  ?  ?Estimated Creatinine Clearance (by C-G formula based on SCr of 1.84 mg/dL (H)) ?Female: 22.7 mL/min (A) ?Female: 27.9 mL/min (A)   ? ?Allergies  ?Allergen Reactions  ? Bee Venom Anaphylaxis  ? Promethazine Swelling  ? Tenofovir Disoproxil Fumarate [Tenofovir Disoproxil Fumarate] Other (See Comments)  ?  Renal failure, (08/27/2006)  ? Compazine  [Prochlorperazine Edisylate] Nausea And Vomiting  ? Haloperidol Lactate Other (See Comments)  ?  Reaction is muscle tension, causes severe spasms in face and neck. Forces eyes to roll in the back of the head  ? Sulfa Antibiotics Diarrhea  ? Brompheniramine-Acetaminophen   ? Chlorcyclizine   ? Chlorpromazine   ? Codeine Itching and Nausea And Vomiting  ? Navane [Thiothixene] Other (See Comments) and Nausea And Vomiting  ?  Same muscle spasm reaction as haldol  ? Prednisone   ?  Brief mild psychosis and agitation  ? Promethazine Hcl Other (See Comments)  ? Propoxyphene N-Acetaminophen Itching and Nausea And Vomiting  ? Morphine Itching and Swelling  ? ? ?Antimicrobials this admission: ?Meropenem 3/13 >>  ?Vancomycin 3/13 >>  ? ?Dose adjustments this admission: ?N/A ? ?Microbiology results: ?2/28 UCx: ESBL Proteus mirabilus (S: mero), E. faecalis  ?3/13 BCx: pending ?3/13 UCx: pending ? ?Thank you for allowing pharmacy to be a part of this patient?s care. ? ?Kaleen Mask ?01/05/2022 3:43 PM ? ?

## 2022-01-05 NOTE — ED Provider Notes (Signed)
[facility] EMERGENCY DEPARTMENT Provider Note   CSN: [id number] Arrival date & time:        History  Chief Complaint  Patient presents with   Hypotension    [name] is a 81 y.o. adult.  HPI  81 year old female with medical history significant for HIV, dementia, MDD, frequent urinary tract infections, CKD, COPD, HTN who was present from her ID clinic due to concern for multidrug-resistant UTI and orthostatic hypotension.  The patient had been feeling lightheaded this morning.  She has recurrent urinary tract infections and has recently been evaluated by urology on 12/23/2021.  Urine Alysis and urine culture was performed at that time and grew out Proteus and Enterococcus faecalis with multiple resistances to oral antibiotics.  She been placed on Bactrim and has been taking the medication.  Per family members, she has had increasing confusion from her baseline.  Infectious disease Dr. Marcille Buffy her to have low blood pressure in clinic with positive orthostatics.  He felt that she needed IV hydration and starting imipenem and meropenem for the Proteus and possibly ampicillin for Enterococcus faecalis given her recent urine cultures.  On arrival to the emergency department, the patient was at her baseline mental status, GCS 14, pleasantly demented.  Home Medications Prior to Admission medications   Medication Sig Start Date End Date Taking? Authorizing Provider  abacavir-dolutegravir-lamiVUDine (TRIUMEQ) 600-50-300 MG tablet Take 1 tablet by mouth daily. 03/12/21   Truman Hayward, MD  acetaminophen (TYLENOL) 500 MG tablet Take 2 tablets (1,000 mg total) by mouth every 6 (six) hours as needed for mild pain or moderate pain. Patient not taking: Reported on 12/01/2021 10/08/20   Virl Cagey, MD  albuterol (PROVENTIL HFA;VENTOLIN HFA) 108 (90 Base) MCG/ACT inhaler Inhale 1-2 puffs into the lungs every 6 (six) hours as needed for wheezing or shortness of  breath. Patient not taking: Reported on 12/01/2021 04/02/17   Nat Christen, MD  ALLERGY RELIEF 10 MG tablet Take 10 mg by mouth daily. 05/02/21   [provider]  ARIPiprazole (ABILIFY) 10 MG tablet Take 10 mg by mouth daily. 10/17/21   [provider]  atorvastatin (LIPITOR) 20 MG tablet Take 20 mg by mouth daily.    [provider]  clonazePAM (KLONOPIN) 1 MG tablet Take 1 tablet (1 mg total) by mouth 2 (two) times daily as needed for anxiety. 06/26/21   Barton Dubois, MD  docusate sodium (COLACE) 100 MG capsule Take 1 capsule (100 mg total) by mouth 2 (two) times daily. 11/21/18   Virl Cagey, MD  DULoxetine (CYMBALTA) 60 MG capsule Take 30 mg by mouth daily. 02/28/21   [provider]  fluticasone (FLONASE) 50 MCG/ACT nasal spray 2 SPRAYS INTO BOTH NOSTRILS AT BEDTIME. Patient taking differently: Place 2 sprays into both nostrils at bedtime. 05/22/19   Truman Hayward, MD  furosemide (LASIX) 40 MG tablet Take 1 tablet (40 mg total) by mouth daily as needed for fluid. Hold until follow up with PCP Patient taking differently: Take 40 mg by mouth daily as needed for fluid. 12/31/19   Barton Dubois, MD  hydrOXYzine (ATARAX) 25 MG tablet Take 25 mg by mouth 2 (two) times daily. 10/20/21   [provider]  labetalol (NORMODYNE) 100 MG tablet Take 1 tablet (100 mg total) by mouth 2 (two) times daily. 05/17/21 12/01/21  ShahmehdiValeria Batman, MD  losartan (COZAAR) 25 MG tablet Take 12.5 mg by mouth daily. 05/02/21   [provider]  Magnesium 100 MG TABS Take 50 mg by mouth daily.    [provider]  mirabegron ER (MYRBETRIQ) 25 MG TB24 tablet Take 1 tablet (25 mg total) by mouth daily. 12/23/21   McKenzie, Candee Furbish, MD  mirtazapine (REMERON) 15 MG tablet Take 15 mg by mouth at bedtime. 11/18/21   [provider]  mirtazapine (REMERON) 30 MG tablet Take 30 mg by mouth at bedtime. 09/17/21   [provider]  mirtazapine (REMERON)  7.5 MG tablet Take 15 mg by mouth at bedtime. For appetite/depression    [provider]  Multiple Vitamin (ONE-A-DAY 55 PLUS PO) Take 1 tablet by mouth daily.    [provider]  nitrofurantoin, macrocrystal-monohydrate, (MACROBID) 100 MG capsule Take 1 capsule (100 mg total) by mouth every 12 (twelve) hours. 12/23/21   McKenzie, Candee Furbish, MD  oxyCODONE (OXY IR/ROXICODONE) 5 MG immediate release tablet Take 5 mg by mouth 2 (two) times daily as needed. 10/15/21   [provider]  pantoprazole (PROTONIX) 40 MG tablet Take 1 tablet (40 mg total) by mouth daily before breakfast. 07/16/20 12/01/21  Erenest Rasher, PA-C  polyethylene glycol (MIRALAX) 17 g packet Take 17 g by mouth daily. Hold for diarrhea 06/26/21   Barton Dubois, MD  polyvinyl alcohol (LIQUIFILM TEARS) 1.4 % ophthalmic solution Place 1 drop into both eyes daily.    [provider]  sulfamethoxazole-trimethoprim (BACTRIM DS) 800-160 MG tablet Take 1 tablet by mouth 2 (two) times daily for 7 days. 12/31/21 01/07/22  Summerlin, Berneice Heinrich, PA-C  thiamine (VITAMIN B-1) 100 MG tablet Take 100 mg by mouth daily.    [provider]      Allergies    Bee venom, Promethazine, Tenofovir disoproxil fumarate [tenofovir disoproxil fumarate], Compazine  [prochlorperazine edisylate], Haloperidol lactate, Sulfa antibiotics, Brompheniramine-acetaminophen, Chlorcyclizine, Chlorpromazine, Codeine, Navane [thiothixene], Prednisone, Promethazine hcl, Propoxyphene n-acetaminophen, and Morphine    Review of Systems   Review of Systems  Unable to perform ROS: Dementia   Physical Exam Updated Vital Signs BP (!) 111/56    Pulse (!) 55    Temp 97.7 F (36.5 C)    Resp 15    SpO2 100%  Physical Exam Vitals and nursing note reviewed.  Constitutional:      General: She is not in acute distress.    Appearance: She is well-developed.  HENT:     Head: Normocephalic and atraumatic.     Mouth/Throat:     Mouth:  Mucous membranes are dry.  Eyes:     Conjunctiva/sclera: Conjunctivae normal.  Cardiovascular:     Rate and Rhythm: Normal rate and regular rhythm.  Pulmonary:     Effort: Pulmonary effort is normal. No respiratory distress.     Breath sounds: Normal breath sounds.  Abdominal:     Palpations: Abdomen is soft.     Tenderness: There is abdominal tenderness. There is no guarding or rebound.     Comments: Surgical scar present, some mild generalized tenderness to palpation  Musculoskeletal:        General: No swelling.     Cervical back: Neck supple.  Skin:    General: Skin is warm and dry.     Capillary Refill: Capillary refill takes less than 2 seconds.  Neurological:     Mental Status: She is alert.     Cranial Nerves: No cranial nerve deficit.     Sensory: No sensory deficit.     Motor: No weakness.  Psychiatric:  Mood and Affect: Mood normal.    ED Results / Procedures / Treatments   Labs (all labs ordered are listed, but only abnormal results are displayed) Labs Reviewed  BASIC METABOLIC PANEL - Abnormal; Notable for the following components:      Result Value   Chloride 112 (*)    Glucose, Bld 109 (*)    BUN 32 (*)    Creatinine, Ser 1.84 (*)    GFR, Estimated 27 (*)    All other components within normal limits  URINALYSIS, ROUTINE W REFLEX MICROSCOPIC - Abnormal; Notable for the following components:   Color, Urine AMBER (*)    APPearance CLOUDY (*)    Protein, ur 100 (*)    Nitrite POSITIVE (*)    Leukocytes,Ua MODERATE (*)    WBC, UA >50 (*)    Bacteria, UA MANY (*)    All other components within normal limits  URINE CULTURE  CULTURE, BLOOD (ROUTINE X 2)  CULTURE, BLOOD (ROUTINE X 2)  RESP PANEL BY RT-PCR (FLU A&B, COVID) ARPGX2  CBC WITH DIFFERENTIAL/PLATELET  LACTIC ACID, PLASMA    EKG None  Radiology DG Chest 2 View  Result Date: 01/05/2022 CLINICAL DATA:  Hypotension EXAM: CHEST - 2 VIEW COMPARISON:  Chest radiograph dated June 21, 2021  FINDINGS: The heart size and mediastinal contours are within normal limits. Both lungs are clear. Thoracic spondylosis. Bilateral acromioclavicular osteoarthritis. No acute osseous abnormality. IMPRESSION: No active cardiopulmonary disease. Electronically Signed   By: Keane Police D.O.   On: 01/05/2022 13:05    Procedures Procedures    Medications Ordered in ED Medications  vancomycin (VANCOREADY) IVPB 1500 mg/300 mL (1,500 mg Intravenous New Bag/Given 01/05/22 1706)  meropenem (MERREM) 500 mg in sodium chloride 0.9 % 100 mL IVPB (has no administration in time range)  vancomycin (VANCOCIN) IVPB 1000 mg/200 mL premix (has no administration in time range)  lactated ringers bolus 1,000 mL (1,000 mLs Intravenous New Bag/Given 01/05/22 1612)    ED Course/ Medical Decision Making/ A&P                           Medical Decision Making Amount and/or Complexity of Data Reviewed Labs: ordered. Radiology: ordered.  Risk Prescription drug management.   81 year old female with medical history significant for HIV, dementia, MDD, frequent urinary tract infections, CKD, COPD, HTN who was present from her ID clinic due to concern for multidrug-resistant UTI and orthostatic hypotension.  The patient had been feeling lightheaded this morning.  She has recurrent urinary tract infections and has recently been evaluated by urology on 12/23/2021.  Urine Alysis and urine culture was performed at that time and grew out Proteus and Enterococcus faecalis with multiple resistances to oral antibiotics.  She been placed on Bactrim and has been taking the medication.  Per family members, she has had increasing confusion from her baseline.  Infectious disease Dr. Marcille Buffy her to have low blood pressure in clinic with positive orthostatics.  He felt that she needed IV hydration and starting imipenem and meropenem for the Proteus and possibly ampicillin for Enterococcus faecalis given her recent urine cultures.  On arrival to  the emergency department, the patient was at her baseline mental status, GCS 14, pleasantly demented.  On arrival, the patient was afebrile, mildly bradycardic P57, hemodynamically stable.  Sinus bradycardia noted on cardiac telemetry.  The patient presents with episodes of hypotension at her infectious disease clinic in the setting of multidrug-resistant urinary tract  infection with multiple organisms.  On exam, patient mucous membranes were dry.  She had no focal neurologic deficit, appeared to be at her baseline for her dementia although family feels she has been slightly more confused than previously.  Family members were requesting social work consultation as they are not happy with the patient's current care at her current facility.  Screening laboratory evaluation performed with a CBC without a leukocytosis or anemia, BMP revealed a new AKI with a creatinine of 1.84 from a baseline of 1-1.3.   IV access was obtained, blood cultures were obtained, urinalysis and urine culture were obtained, the patient was administered an IV fluid bolus for volume resuscitation and broad-spectrum antibiotics were ordered to include vancomycin and meropenem given the patient's recent urine culture results.  Given the concern for dehydration, multidrug-resistant urinary tract infection requiring IV antibiotics and new AKI, plan will be for medicine consult for admission.  Plan at time of signout to follow-up the patient's CT imaging which was ordered to evaluate for the patient's change in mental status and generalized abdominal tenderness.  CT head and CT abdomen pelvis without contrast given the patient's GFR was pending at time of signout.  Signout given to Dr. Eulis Foster at 4:30 PM.   Final Clinical Impression(s) / ED Diagnoses Final diagnoses:  AKI (acute kidney injury) ([place])  Acute cystitis with hematuria    Rx / DC Orders ED Discharge Orders     None         Regan Lemming, MD 01/05/22 1722

## 2022-01-05 NOTE — Progress Notes (Signed)
Subjective:   Chief complaints: Dizziness if she tries to stand and dysuria  :  Patient ID: [name], adult    DOB: [birth year], 81 y.o.   MRN: [id number]  HPI   81 year-old Caucasian female with HIV   has recently been well controlled on Triumeq but with multiple medical problems including some recent problems of psychiatric conditions depression chronic kidney disease who has had recurrent urinary tract infections and was evaluated by urology recently.  She says she has been feeling dizzy but this even preceded her visit with urology.     My concern however is that she has continued dizziness in the context of ongoing symptoms of dysuria which make me worry about a more serious urinary tract infection than 1 that is only acting at level of bladder where Macrobid would be effective.  Urine culture with from February 28 had grown an ESBL Proteus as well as an ampicillin sensitive Enterococcus faecalis. She was given return to cover the ESBL on Macrobid to cover the Enterococcus faecalis  Regardless here in the clinic her systolic blood pressure was in the 70s while sitting in her wheelchair.  She is on 3 drugs that can certainly activate lower blood pressure metoprolol losartan and her diuretic Lasix.  Concerned that if she does not have something going on in terms of dehydration from an infection there is something going on to make her blood pressure so low she does not endorse hematuria or hematemesis or blood per rectum.  She does appear to weigh at least 15 to 16 pounds less then in 2021.          Past Medical History:  Diagnosis Date   Anxiety    Arthritis    Bronchitis    Bronchitis    CKD (chronic kidney disease) stage 3, GFR 30-59 ml/min ([place]) 0/25/4270   Complication of anesthesia    pt states she had a cardiac arrest during hysterectomy 1985.  Pt had several surgies since then that were uneventful   COPD (chronic obstructive pulmonary disease) ([place])     Depression    Dysuria 03/27/2019   Elevated LFTs 05/04/2016   History of TIAs 1981   left side weakness   HIV (human immunodeficiency virus infection) ([place])    Hypertension    Kidney disease related to HIV infection [facility])    pt states she has to have her creatinine level checked often    Moderate single current episode of major depressive disorder ([place]) 03/12/2021   Nausea 03/03/2017   Peripheral neuropathy    bilateral feet   Seasonal allergies 03/27/2019   Sinusitis 05/06/2018   UTI (urinary tract infection) 03/03/2017   Weight loss 03/03/2017    Past Surgical History:  Procedure Laterality Date   ABDOMINAL HYSTERECTOMY  1985   BACK SURGERY     BIOPSY  07/15/2020   Procedure: BIOPSY;  Surgeon: Daneil Dolin, MD;  [facility];  Service: Endoscopy;;   CATARACT EXTRACTION, BILATERAL     CHOLECYSTECTOMY     COLONOSCOPY  08/10/2002   NUR: Normal colonoscopy except for some hemorrhoids and some erythema at the dentate line   COLONOSCOPY  12/10/2006   Edwards:Diffuse colitis in the transverse and descending colon/This is not entirely typical of ischemic colitis or her HIV positive/ disease.  We need to be concerned about other causes   COLONOSCOPY  01/19/2011   RMR: Internal and external hemorrhoids, likely source of hematochezia anal papilla, otherwise normal rectal mucosa/ Left-sided  diverticula.  Cecal polyp with status post cold snare polypectomy.  Remainder of colonic mucosa appeared normal. Pathology with tubular adenoma. Repeat in 2017   COLONOSCOPY WITH PROPOFOL N/A 07/15/2020   Procedure: COLONOSCOPY WITH PROPOFOL;  Surgeon: Daneil Dolin, MD;  Location[facility];  Service: Endoscopy;  Laterality: N/A;  8:15AM TCS via Ostomy   COLOSTOMY N/A 11/12/2018   Procedure: COLOSTOMY;  Surgeon: Virl Cagey, MD;  Location: [facility];  Service: General;  Laterality: N/A;   COLOSTOMY REVERSAL N/A 10/02/2020   Procedure: COLOSTOMY REVERSAL;  Surgeon: Virl Cagey, MD;   Location: [facility];  Service: General;  Laterality: N/A;   ESOPHAGOGASTRODUODENOSCOPY  08/10/2002     NUR: Mild changes of reflux esophagitis limited to gastroesophageal junction  No evidence of ring, stricture, or esophageal candidiasis dysphagia/ Gastritis, possibly H. pylori induced   ESOPHAGOGASTRODUODENOSCOPY  12/25/2002   NUR: Erosive antral gastritis.  The degree of gastritis is more significant than her last exam/ Normal examination of the esophagus/ Esophageal dilatation performed by passing 77 and 90 [place]   ESOPHAGOGASTRODUODENOSCOPY  11/13/2010   RMR: Circumferential distal esophageal erosions with soft peptic stricture, consistent with erosive reflux esophagitis or stricture  formation, status post dilation as described above/  Small hiatal hernia/ Tiny antral erosions, otherwise normal stomach D1 and D2   ESOPHAGOGASTRODUODENOSCOPY (EGD) WITH PROPOFOL N/A 07/15/2020   Procedure: ESOPHAGOGASTRODUODENOSCOPY (EGD) WITH PROPOFOL;  Surgeon: Daneil Dolin, MD;  [facility];  Service: Endoscopy;  Laterality: N/A;   FLEXIBLE SIGMOIDOSCOPY N/A 07/15/2020   Procedure: FLEXIBLE SIGMOIDOSCOPY;  Surgeon: Daneil Dolin, MD;  [facility];  Service: Endoscopy;  Laterality: N/A;   HEMORRHOID SURGERY  09/02/2012   Procedure: HEMORRHOIDECTOMY;  Surgeon: Jamesetta So, MD;  Location: [facility];  Service: General;  Laterality: N/A;   LAPAROTOMY N/A 11/12/2018   Procedure: EXPLORATORY LAPAROTOMY;  Surgeon: Virl Cagey, MD;  Location: [facility];  Service: General;  Laterality: N/A;   MALONEY DILATION N/A 07/15/2020   Procedure: Keturah Shavers;  Surgeon: Daneil Dolin, MD;  [facility];  Service: Endoscopy;  Laterality: N/A;   PARTIAL COLECTOMY N/A 11/12/2018   Procedure: PARTIAL COLECTOMY;  Surgeon: Virl Cagey, MD;  Location: [facility];  Service: General;  Laterality: N/A;   POLYPECTOMY  07/15/2020   Procedure: POLYPECTOMY;  Surgeon: Daneil Dolin, MD;   [facility];  Service: Endoscopy;;   TUBAL LIGATION      Family History  Problem Relation Age of Onset   Diabetes Mother    Diabetes Brother    Colon cancer Brother        Diagnosed > age 47      Social History   Socioeconomic History   Marital status: Widowed    Spouse name: Not on file   Number of children: 3   Years of education: Not on file   Highest education level: Not on file  Occupational History   Occupation: retired    Fish farm manager: RETIRED  Tobacco Use   Smoking status: Never   Smokeless tobacco: Never  Vaping Use   Vaping Use: Never used  Substance and Sexual Activity   Alcohol use: No   Drug use: No   Sexual activity: Not Currently    Comment: declined condoms  Other Topics Concern   Not on file  Social History Narrative   Not on file   Social Determinants of Health   Financial Resource Strain: Not on  file  Food Insecurity: Not on file  Transportation Needs: Not on file  Physical Activity: Not on file  Stress: Not on file  Social Connections: Not on file    Allergies  Allergen Reactions   Bee Venom Anaphylaxis   Promethazine Swelling   Tenofovir Disoproxil Fumarate [Tenofovir Disoproxil Fumarate] Other (See Comments)    Renal failure, (08/27/2006)   Compazine  [Prochlorperazine Edisylate] Nausea And Vomiting   Haloperidol Lactate Other (See Comments)    Reaction is muscle tension, causes severe spasms in face and neck. Forces eyes to roll in the back of the head   Sulfa Antibiotics Diarrhea   Brompheniramine-Acetaminophen    Chlorcyclizine    Chlorpromazine    Codeine Itching and Nausea And Vomiting   Navane [Thiothixene] Other (See Comments) and Nausea And Vomiting    Same muscle spasm reaction as haldol   Prednisone     Brief mild psychosis and agitation   Promethazine Hcl Other (See Comments)   Propoxyphene N-Acetaminophen Itching and Nausea And Vomiting   Morphine Itching and Swelling     Current Outpatient Medications:     ARIPiprazole (ABILIFY) 10 MG tablet, Take 10 mg by mouth daily., Disp: , Rfl:    nitrofurantoin, macrocrystal-monohydrate, (MACROBID) 100 MG capsule, Take 1 capsule (100 mg total) by mouth every 12 (twelve) hours., Disp: 14 capsule, Rfl: 0   abacavir-dolutegravir-lamiVUDine (TRIUMEQ) 600-50-300 MG tablet, Take 1 tablet by mouth daily., Disp: 30 tablet, Rfl: 11   acetaminophen (TYLENOL) 500 MG tablet, Take 2 tablets (1,000 mg total) by mouth every 6 (six) hours as needed for mild pain or moderate pain. (Patient not taking: Reported on 12/01/2021), Disp: 30 tablet, Rfl: 0   albuterol (PROVENTIL HFA;VENTOLIN HFA) 108 (90 Base) MCG/ACT inhaler, Inhale 1-2 puffs into the lungs every 6 (six) hours as needed for wheezing or shortness of breath. (Patient not taking: Reported on 12/01/2021), Disp: 1 Inhaler, Rfl: 0   ALLERGY RELIEF 10 MG tablet, Take 10 mg by mouth daily., Disp: , Rfl:    atorvastatin (LIPITOR) 20 MG tablet, Take 20 mg by mouth daily., Disp: , Rfl:    cefTRIAXone (ROCEPHIN) 1 g injection, Inject 1 g into the muscle daily., Disp: , Rfl:    clonazePAM (KLONOPIN) 1 MG tablet, Take 1 tablet (1 mg total) by mouth 2 (two) times daily as needed for anxiety., Disp: 14 tablet, Rfl: 0   docusate sodium (COLACE) 100 MG capsule, Take 1 capsule (100 mg total) by mouth 2 (two) times daily., Disp: 10 capsule, Rfl: 0   DULoxetine (CYMBALTA) 60 MG capsule, Take 30 mg by mouth daily., Disp: , Rfl:    fluticasone (FLONASE) 50 MCG/ACT nasal spray, 2 SPRAYS INTO BOTH NOSTRILS AT BEDTIME. (Patient taking differently: Place 2 sprays into both nostrils at bedtime.), Disp: 16 g, Rfl: 5   furosemide (LASIX) 40 MG tablet, Take 1 tablet (40 mg total) by mouth daily as needed for fluid. Hold until follow up with PCP (Patient taking differently: Take 40 mg by mouth daily as needed for fluid.), Disp: , Rfl:    hydrOXYzine (ATARAX) 25 MG tablet, Take 25 mg by mouth 2 (two) times daily., Disp: , Rfl:    labetalol (NORMODYNE)  100 MG tablet, Take 1 tablet (100 mg total) by mouth 2 (two) times daily., Disp: 60 tablet, Rfl: 0   losartan (COZAAR) 25 MG tablet, Take 12.5 mg by mouth daily., Disp: , Rfl:    Magnesium 100 MG TABS, Take 50 mg by mouth daily., Disp: ,  Rfl:    mirabegron ER (MYRBETRIQ) 25 MG TB24 tablet, Take 1 tablet (25 mg total) by mouth daily., Disp: 30 tablet, Rfl: 0   mirtazapine (REMERON) 15 MG tablet, Take 15 mg by mouth at bedtime., Disp: , Rfl:    mirtazapine (REMERON) 30 MG tablet, Take 30 mg by mouth at bedtime., Disp: , Rfl:    mirtazapine (REMERON) 7.5 MG tablet, Take 15 mg by mouth at bedtime. For appetite/depression, Disp: , Rfl:    Multiple Vitamin (ONE-A-DAY 55 PLUS PO), Take 1 tablet by mouth daily., Disp: , Rfl:    oxyCODONE (OXY IR/ROXICODONE) 5 MG immediate release tablet, Take 5 mg by mouth 2 (two) times daily as needed., Disp: , Rfl:    pantoprazole (PROTONIX) 40 MG tablet, Take 1 tablet (40 mg total) by mouth daily before breakfast., Disp: 30 tablet, Rfl: 5   polyethylene glycol (MIRALAX) 17 g packet, Take 17 g by mouth daily. Hold for diarrhea, Disp: , Rfl:    polyvinyl alcohol (LIQUIFILM TEARS) 1.4 % ophthalmic solution, Place 1 drop into both eyes daily., Disp: , Rfl:    sulfamethoxazole-trimethoprim (BACTRIM DS) 800-160 MG tablet, Take 1 tablet by mouth 2 (two) times daily for 7 days., Disp: 14 tablet, Rfl: 0   thiamine (VITAMIN B-1) 100 MG tablet, Take 100 mg by mouth daily., Disp: , Rfl:   SReview of Systems  Unable to perform ROS: Dementia      Objective:   Physical Exam Constitutional:      Appearance: She is obese.  Cardiovascular:     Rate and Rhythm: Bradycardia present.     Pulses: Normal pulses.     Heart sounds: Normal heart sounds. No murmur heard.   No friction rub. No gallop.  Pulmonary:     Effort: No respiratory distress.     Breath sounds: No stridor. Rhonchi present. No wheezing or rales.  Chest:     Chest wall: No tenderness.  Abdominal:     General:  Bowel sounds are normal. There is no distension.     Palpations: Abdomen is soft. There is no mass.     Tenderness: There is abdominal tenderness in the suprapubic area.  Neurological:     General: No focal deficit present.     Mental Status: She is alert.  Psychiatric:        Attention and Perception: Attention normal.        Mood and Affect: Mood normal.        Speech: Speech is delayed.        Behavior: Behavior normal.        Thought Content: Thought content normal.        Cognition and Memory: Cognition normal. Memory is impaired. She exhibits impaired recent memory.        Judgment: Judgment normal.          Assessment & Plan:   Hypotension: History of orthostatic dizziness and low BP here in the clinic  BP in [facility] was 132/76  on 12/26/2021.  She have a more serious infection that is making her hypotensive?  Certainly I do not think it is safe for her to go back to the skilled nursing facility with a blood pressure where it is.  I recommend that she go to the emergency department [facility] and we will call EMS to arrange transportation.  I recommend getting blood cultures urinalysis and culture CBC and CMP.  She clearly needs hydration and I would hold her metoprolol  losartan and Lasix at present.  I would recommend starting Imipenem or meropenem to for the Proteus and the ampicillin sensitive Enterococcus faecalis that they are the culprit organisms causing her infection.  This problem with her blood pressure needs to be resolved for she can be safely discharged back to skilled facility.  History of recurrent urinary tract infections: Had an ESBL Proteus and an amp sensitive Enterococcus growth in culture in late February.  Concern will be if these infections have progressed particular in the context of Macrobid that only asked the level bladder.  Again would get repeat urine cultures analysis blood cultures and provided she still has pyuria initiate empiric  imipenem versus meropenem.  HIV disease: Has been well controlled but on [place] most recently has had more extensive treatment history and was on a boosted protease inhibitor with abacavir/lamivudine in the past as well as Isentress abacavir/lamivudine.  If she is hospitalized would recommend getting viral load while she is in the hospital if not happy to see her again in the clinic after her problem with hypotension is resolved.  HTN: not hypertensive but hypotensive  Dementia in context of psychiatric problems: does she have a HCPOA  I spent 41 minutes with the patient including than 50% of the time in face to face counseling of the patient personally reviewing her updated cultures from February along with review of medical records in preparation for the visit and during the visit and in coordination of her care.

## 2022-01-05 NOTE — ED Notes (Signed)
Changed patient brief put another one on patient is resting with call bell in reach  ?

## 2022-01-05 NOTE — ED Provider Notes (Signed)
4:45 PM-checkout from Dr. Dennis Bast to evaluate patient after imaging to consider appropriate disposition.  Anticipate she will need to be admitted for hypotension with urinary tract infection, which is complicated.  She was seen earlier today at the ID clinic, and recommended to come here for evaluation and likely admission.  She has a history of UTIs with Proteus and Enterococcus faecalis.  She has been started on Vancomycin and ertapenem. ? ?8:05 PM-vital signs have improved, she is eating.  She is alert.  At this time she is eating and comfortable.  Updated family members on findings.  Plan admission for observation until urinary and blood cultures returned. ? ?8:17 PM-Consult complete with hospitalist. Patient case explained and discussed.  He agrees to admit patient for further evaluation and treatment. Call ended at 8:47 PM ?  ?Daleen Bo, MD ?01/05/22 2047 ? ?

## 2022-01-05 NOTE — ED Notes (Signed)
Family at bedside informed of visitor policy, both family members elected to leave at this time. Family is not leaving hospital, but leaving the ED. ?

## 2022-01-05 NOTE — ED Triage Notes (Signed)
Patient BIB [facility] from dr office. Per office pt was hypotensive in the 90s with them, pt normotensive upon EMS arrival.  ?

## 2022-01-05 NOTE — ED Notes (Signed)
Patient transported to X-ray 

## 2022-01-06 ENCOUNTER — Encounter (HOSPITAL_COMMUNITY): Payer: Self-pay | Admitting: Internal Medicine

## 2022-01-06 ENCOUNTER — Other Ambulatory Visit: Payer: Self-pay

## 2022-01-06 DIAGNOSIS — N3000 Acute cystitis without hematuria: Secondary | ICD-10-CM | POA: Diagnosis not present

## 2022-01-06 DIAGNOSIS — I959 Hypotension, unspecified: Secondary | ICD-10-CM | POA: Diagnosis present

## 2022-01-06 LAB — COMPREHENSIVE METABOLIC PANEL
ALT: 16 U/L (ref 0–44)
AST: 17 U/L (ref 15–41)
Albumin: 3.1 g/dL — ABNORMAL LOW (ref 3.5–5.0)
Alkaline Phosphatase: 69 U/L (ref 38–126)
Anion gap: 7 (ref 5–15)
BUN: 29 mg/dL — ABNORMAL HIGH (ref 8–23)
CO2: 20 mmol/L — ABNORMAL LOW (ref 22–32)
Calcium: 9.7 mg/dL (ref 8.9–10.3)
Chloride: 111 mmol/L (ref 98–111)
Creatinine, Ser: 1.71 mg/dL — ABNORMAL HIGH (ref 0.44–1.00)
GFR, Estimated: 30 mL/min — ABNORMAL LOW (ref >60)
Glucose, Bld: 99 mg/dL (ref 70–99)
Potassium: 4.7 mmol/L (ref 3.5–5.1)
Sodium: 138 mmol/L (ref 135–145)
Total Bilirubin: 0.6 mg/dL (ref 0.3–1.2)
Total Protein: 5.3 g/dL — ABNORMAL LOW (ref 6.5–8.1)

## 2022-01-06 LAB — CBC WITH DIFFERENTIAL/PLATELET
Abs Immature Granulocytes: 0.01 10*3/uL (ref 0.00–0.07)
Basophils Absolute: 0 10*3/uL (ref 0.0–0.1)
Basophils Relative: 1 %
Eosinophils Absolute: 0.2 10*3/uL (ref 0.0–0.5)
Eosinophils Relative: 3 %
HCT: 35.6 % — ABNORMAL LOW (ref 36.0–46.0)
Hemoglobin: 12 g/dL (ref 12.0–15.0)
Immature Granulocytes: 0 %
Lymphocytes Relative: 40 %
Lymphs Abs: 2.3 10*3/uL (ref 0.7–4.0)
MCH: 33.5 pg (ref 26.0–34.0)
MCHC: 33.7 g/dL (ref 30.0–36.0)
MCV: 99.4 fL (ref 80.0–100.0)
Monocytes Absolute: 0.6 10*3/uL (ref 0.1–1.0)
Monocytes Relative: 9 %
Neutro Abs: 2.7 10*3/uL (ref 1.7–7.7)
Neutrophils Relative %: 47 %
Platelets: 168 10*3/uL (ref 150–400)
RBC: 3.58 MIL/uL — ABNORMAL LOW (ref 3.87–5.11)
RDW: 13.1 % (ref 11.5–15.5)
WBC: 5.8 10*3/uL (ref 4.0–10.5)
nRBC: 0 % (ref 0.0–0.2)

## 2022-01-06 LAB — T-HELPER CELLS (CD4) COUNT (NOT AT ARMC)
CD4 % Helper T Cell: 26 % — ABNORMAL LOW (ref 33–65)
CD4 T Cell Abs: 633 /uL (ref 400–1790)

## 2022-01-06 LAB — SODIUM, URINE, RANDOM: Sodium, Ur: 101 mmol/L

## 2022-01-06 LAB — MAGNESIUM: Magnesium: 1.8 mg/dL (ref 1.7–2.4)

## 2022-01-06 LAB — CREATININE, URINE, RANDOM: Creatinine, Urine: 111.7 mg/dL

## 2022-01-06 MED ORDER — FLUTICASONE PROPIONATE 50 MCG/ACT NA SUSP
2.0000 | Freq: Every day | NASAL | Status: DC
  Administered 2022-01-07: 2 via NASAL
  Filled 2022-01-06: qty 16

## 2022-01-06 MED ORDER — ADULT MULTIVITAMIN W/MINERALS CH
1.0000 | ORAL_TABLET | Freq: Every day | ORAL | Status: DC
  Administered 2022-01-06 – 2022-01-11 (×7): 1 via ORAL
  Filled 2022-01-06 (×7): qty 1

## 2022-01-06 MED ORDER — ATORVASTATIN CALCIUM 10 MG PO TABS
20.0000 mg | ORAL_TABLET | Freq: Every day | ORAL | Status: DC
  Administered 2022-01-06 – 2022-01-12 (×7): 20 mg via ORAL
  Filled 2022-01-06 (×7): qty 2

## 2022-01-06 MED ORDER — ARIPIPRAZOLE 10 MG PO TABS
10.0000 mg | ORAL_TABLET | Freq: Every day | ORAL | Status: DC
  Administered 2022-01-06 – 2022-01-12 (×7): 10 mg via ORAL
  Filled 2022-01-06 (×7): qty 1

## 2022-01-06 MED ORDER — ENSURE ENLIVE PO LIQD
237.0000 mL | Freq: Two times a day (BID) | ORAL | Status: DC
  Administered 2022-01-06 – 2022-01-12 (×14): 237 mL via ORAL

## 2022-01-06 MED ORDER — POLYETHYLENE GLYCOL 3350 17 G PO PACK
17.0000 g | PACK | Freq: Every day | ORAL | Status: DC
  Administered 2022-01-07 – 2022-01-12 (×6): 17 g via ORAL
  Filled 2022-01-06 (×6): qty 1

## 2022-01-06 MED ORDER — POLYVINYL ALCOHOL 1.4 % OP SOLN
1.0000 [drp] | Freq: Every day | OPHTHALMIC | Status: DC
  Administered 2022-01-06 – 2022-01-12 (×7): 1 [drp] via OPHTHALMIC
  Filled 2022-01-06: qty 15

## 2022-01-06 MED ORDER — ABACAVIR-DOLUTEGRAVIR-LAMIVUD 600-50-300 MG PO TABS
1.0000 | ORAL_TABLET | Freq: Every day | ORAL | Status: DC
  Administered 2022-01-06 – 2022-01-12 (×7): 1 via ORAL
  Filled 2022-01-06 (×8): qty 1

## 2022-01-06 NOTE — TOC Initial Note (Signed)
Transition of Care (TOC) - Initial/Assessment Note  ? ? ?Patient Details  ?Name: [name] ?MRN: [id number] ?Date of Birth: Mar 11, 1941 ? ?Transition of Care (TOC) CM/SW Contact:    ?Emeterio Reeve, LCSW ?Phone Number: ?01/06/2022, 4:02 PM ? ?Clinical Narrative:                 ? ?CSW spoke to pts POA on phone. Pt is from [facility]. Pt has been there since August 2022. Pt has medicaid but its not in her chart. POA does not want pt to return to facility. POA states pt has fallen 5 times since august, has had multiple UTI's and always soiled when she comes to visit. POA prefers pt to go to [facility], [facility] or [facility]. CSW will follow up with other LTC facilities. ? ?CSW called [place] multiple times with no answer.  ? ?Expected Discharge Plan: [place] ?Barriers to Discharge: Continued Medical Work up ? ? ?Patient Goals and CMS Choice ?  ?CMS Medicare.gov Compare Post Acute Care list provided to:: Patient ?Choice offered to / list presented to : [facility] POA / Guardian ? ?Expected Discharge Plan and Services ?Expected Discharge Plan: [place] ?  ?  ?  ?Living arrangements for the past 2 months: [place] ?                ?  ?  ?  ?  ?  ?  ?  ?  ?  ?  ? ?Prior Living Arrangements/Services ?Living arrangements for the past 2 months: [place] ?Lives with:: Facility Resident ?Patient language and need for interpreter reviewed:: Yes ?Do you feel safe going back to the place where you live?: Yes      ?Need for Family Participation in Patient Care: Yes (Comment) ?Care giver support system in place?: Yes (comment) ?  ?Criminal Activity/Legal Involvement Pertinent to Current Situation/Hospitalization: No - Comment as needed ? ?Activities of Daily Living ?Home Assistive Devices/Equipment: Wheelchair ?ADL Screening (condition at time of admission) ?Patient's cognitive ability adequate to safely complete daily activities?: Yes ?Is the patient deaf or have  difficulty hearing?: No ?Does the patient have difficulty seeing, even when wearing glasses/contacts?: No ?Does the patient have difficulty concentrating, remembering, or making decisions?: Yes ?Patient able to express need for assistance with ADLs?: Yes ?Does the patient have difficulty dressing or bathing?: Yes ?Independently performs ADLs?: No ?Communication: Independent ?Dressing (OT): Needs assistance ?Is this a change from baseline?: Pre-admission baseline ?Grooming: Needs assistance ?Is this a change from baseline?: Pre-admission baseline ?Bathing: Needs assistance ?Is this a change from baseline?: Pre-admission baseline ?Toileting: Independent ?In/Out Bed: Needs assistance ?Is this a change from baseline?: Pre-admission baseline ?Walks in Home: Needs assistance ?Is this a change from baseline?: Pre-admission baseline ?Does the patient have difficulty walking or climbing stairs?: Yes ?Weakness of Legs: Both ?Weakness of Arms/Hands: None ? ?Permission Sought/Granted ?Permission sought to share information with : Customer service manager ?Permission granted to share information with : Yes, Verbal Permission Granted ?   ? Permission granted to share info w AGENCY: SNF ?   ?   ? ?Emotional Assessment ?Appearance:: Appears stated age ?Attitude/Demeanor/Rapport: Unable to Assess ?Affect (typically observed): Unable to Assess ?Orientation: : Oriented to Self, Oriented to Place ?Alcohol / Substance Use: Not Applicable ?Psych Involvement: No (comment) ? ?Admission diagnosis:  Acute cystitis [N30.00] ?Acute cystitis with hematuria [N30.01] ?AKI (acute kidney injury) ([place]) [N17.9] ?Patient Active Problem List  ?  Diagnosis Date Noted  ? Hypotension 01/06/2022  ? Low blood pressure 01/05/2022  ? Dementia ([place]) 01/05/2022  ? Confusion 01/05/2022  ? Dizziness 01/05/2022  ? Acute cystitis 01/05/2022  ? Abdominal pain 06/21/2021  ? Hypercalcemia 06/21/2021  ? Serum total bilirubin elevated 06/21/2021  ? History of  schizophrenia 06/21/2021  ? SBO (small bowel obstruction) ([place]) 06/21/2021  ? Generalized weakness 05/10/2021  ? Elevated MCV 05/10/2021  ? Moderate single current episode of major depressive disorder ([place]) 03/12/2021  ? Incontinence of urine in female 11/21/2020  ? Skin yeast infection 11/21/2020  ? Mass of left side of neck 09/12/2020  ? Colostomy in place [facility]) 09/12/2020  ? Housing problems 06/06/2020  ? Healthcare maintenance 06/06/2020  ? Abdominal wall bulge 04/22/2020  ? AKI (acute kidney injury) ([place])   ? History of colonic polyps 07/06/2019  ? History of partial colectomy 07/06/2019  ? Seasonal allergies 03/27/2019  ? Dysuria 03/27/2019  ? Obesity (BMI 30-39.9) 11/12/2018  ? Constipation due to opioid therapy 11/12/2018  ? Lactic acidosis 11/12/2018  ? Large bowel perforation ([place]) 11/12/2018  ? Peritonitis ([place]) 11/12/2018  ? Stercoral ulcer of large intestine 11/12/2018  ? Pneumoperitoneum   ? COPD (chronic obstructive pulmonary disease) ([place]) 12/17/2017  ? Hypothyroidism 12/17/2017  ? Steroid-induced psychosis, with hallucinations ([place])   ? UTI (urinary tract infection) 03/03/2017  ? Nausea & vomiting 03/03/2017  ? Weight loss 03/03/2017  ? CAP (community acquired pneumonia) 07/22/2016  ? CKD (chronic kidney disease) stage 3, GFR 30-59 ml/min (H[facility]) 05/04/2016  ? Elevated LFTs 05/04/2016  ? 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 Breath shortness 06/19/2014  ? Syncope 05/31/2014  ? Rib fracture 05/31/2014  ? Weight gain 03/21/2014  ? Cervical spondylosis 08/15/2013  ? Neck pain on left side 05/24/2013  ? Unspecified vitamin D deficiency 12/14/2012  ? Vitamin D toxicity 12/14/2012  ? Hemorrhoid 09/01/2012  ? Myalgia 02/25/2012  ? Essential hypertension 11/25/2011  ? Cramps, muscle, general 08/18/2011  ? RECTAL BLEEDING 11/03/2010  ? Esophageal dysphagia 11/03/2010  ? FATIGUE 08/29/2010  ? MUSCLE PAIN 06/04/2010  ? Cystitis 05/12/2010  ? RENAL INSUFFICIENCY 12/18/2008  ? DEPRESSION 09/04/2008  ? HLD  (hyperlipidemia) 05/22/2008  ? IRRITABLE BOWEL SYNDROME 05/22/2008  ? HERNIATED CERVICAL DISC 10/05/2007  ? HERNIATED LUMBAR DISC 10/05/2007  ? History of urinary tract infection 09/20/2007  ? HIV-1 associated autonomic neuropathy ([place]) 03/16/2007  ? ELBOW PAIN 03/16/2007  ? ISCHEMIC COLITIS 12/27/2006  ? Acute kidney injury superimposed on CKD ([place]) 12/27/2006  ? HIV (human immunodeficiency virus infection) ([place]) 08/27/2006  ? ?PCP:  Wannetta Sender, FNP ?Pharmacy:   ?[facility] ?[address] ?[place] [address] ?Phone: [phone] Fax: [phone] ? ? ? ? ?Social Determinants of Health (SDOH) Interventions ?  ? ?Readmission Risk Interventions ?Readmission Risk Prevention Plan 06/23/2021 05/11/2021  ?Transportation Screening Complete Complete  ?PCP or Specialist Appt within 3-5 Days - Complete  ?[place] or Home Care Consult Complete Complete  ?Social Work Consult for [place] Planning/Counseling Complete Complete  ?Palliative Care Screening Not Applicable Not Applicable  ?Medication Review Press photographer) Complete Complete  ?Some recent data might be hidden  ? ?Emeterio Reeve, LCSW ?Clinical Social Worker ? ? ?

## 2022-01-06 NOTE — Assessment & Plan Note (Addendum)
Urine cx 2/28 with ESBL proteus mirabilis and enterococcus faecalis sensitive to ampicillin ?Appreciate Dr. [name] assistance, recommended meropenem for proteus and ampicillin sensitive enterococcus ?Follow urine culture ? CT with slight urinary bladder wall thickening and perivesicular fat stranding ?

## 2022-01-06 NOTE — H&P (Signed)
?History and Physical  ? ? ?PLEASE NOTE THAT [employer] SOFTWARE WAS USED IN THE CONSTRUCTION OF THIS NOTE. ? ? ?[name] PXT:[id number] DOB: [birth year] DOA: 01/05/2022 ? ?PCP: Wannetta Sender, FNP  ?Patient coming from: home  ? ?I have personally briefly reviewed patient's old medical records in [place] ? ?Chief Complaint: Concern for urinary tract infection ? ?HPI: [name] is a 81 y.o. adult with medical history significant for HIV, hypertension, hyperlipidemia who is admitted to [facility] on 01/05/2022 with acute cystitis after presenting from home to [facility] ED for evaluation of potential UTI.  ? ?Patient reportedly attended routine follow-up in infectious disease clinic with Dr [name] in setting of a history of HIV as well as prior multidrug-resistant urinary tract infections, including prior urine cultures growing Proteus as well as Enterococcus.  Dr. [name] Noted patient's blood pressures to be running soft, raising concern for recurrence of urinary tract infection, prompting the patient to be sent over to [facility] emergency department for further evaluation and management of borderline hypotension as well as suspected underlying urinary tract infection, likely requiring IV antibiotics given history of multidrug-resistant UTIs, including that of Enterococcus.  ? ?Patient without acute complaint at this time, including no subjective fever and chills, rigors, generalized myalgias.  Cough, shortness of breath, chest pain, rash.  Patient with a documented history of dementia with known history of sundowning. ? ?In the setting of a history of HIV, she is on Triumeq.  Additionally, per chart review, most recent prior serum creatinine data point noted to be 0.98 in August 2022. ? ? ? ? ?ED Course:  ?Vital signs in the ED were notable for the following: Afebrile; heart rate 55-71; initial blood pressure 104/63, which has improved to 149/52 following a liter bolus of LR;  respiratory 1723, oxygen saturation 96% on room air. ? ?Labs were notable for the following: CMP notable for creatinine 1.84.  CBC notable for white cell count 5700.  Lactic acid 0.9.  Urinalysis was associated with a cloudy appearing specimen and was notable for greater than 50 blood cells, many bacteria, no red blood cells, but was nitrite positive and demonstrated moderate leukocyte esterase.  Blood cultures and urine culture collected prior to initiation of IV antibiotics.  COVID-19 is #influenza PCR negative. ? ?Imaging and additional notable ED work-up: Chest x-ray showed no evidence of acute cardiopulmonary process, including no evidence of infiltrate.  CT abdomen/pelvis showed wall thickening associate with the urinary bladder consistent with acute cystitis, otherwise no x-ray no evidence of acute intra-abdominal/intrapelvic process. ? ?While in the ED, the following were administered: Meropenem and IV vancomycin.  This is in addition to the aforementioned 1 L LR bolus. ? ?Subsequently, the patient was admitted for further evaluation and management of acute cystitis after presenting for evaluation of borderline hypotension, with presentation also notable for acute kidney injury. ? ? ? ?Review of Systems: As per HPI otherwise 10 point review of systems negative.  ? ?Past Medical History:  ?Diagnosis Date  ? Anxiety   ? Arthritis   ? Bronchitis   ? Bronchitis   ? CKD (chronic kidney disease) stage 3, GFR 30-59 ml/min (HCC) 05/04/2016  ? Complication of anesthesia   ? pt states she had a cardiac arrest during hysterectomy 1985.  Pt had several surgies since then that were uneventful  ? Confusion 01/05/2022  ? COPD (chronic obstructive pulmonary disease) ([place])   ? Dementia ([place]) 01/05/2022  ?  Depression   ? Dizziness 01/05/2022  ? Dysuria 03/27/2019  ? Elevated LFTs 05/04/2016  ? History of TIAs 1981  ? left side weakness  ? HIV (human immunodeficiency virus infection) ([place])   ? Hypertension   ? Kidney disease related  to HIV infection ([place])   ? pt states she has to have her creatinine level checked often   ? Low blood pressure 01/05/2022  ? Moderate single current episode of major depressive disorder ([place]) 03/12/2021  ? Nausea 03/03/2017  ? Peripheral neuropathy   ? bilateral feet  ? Seasonal allergies 03/27/2019  ? Sinusitis 05/06/2018  ? UTI (urinary tract infection) 03/03/2017  ? Weight loss 03/03/2017  ? ? ?Past Surgical History:  ?Procedure Laterality Date  ? ABDOMINAL HYSTERECTOMY  1985  ? BACK SURGERY    ? BIOPSY  07/15/2020  ? Procedure: BIOPSY;  Surgeon: Daneil Dolin, MD;  [facility];  Service: Endoscopy;;  ? CATARACT EXTRACTION, BILATERAL    ? CHOLECYSTECTOMY    ? COLONOSCOPY  08/10/2002  ? NUR: Normal colonoscopy except for some hemorrhoids and some erythema at the dentate line  ? COLONOSCOPY  12/10/2006  ? [place]:Diffuse colitis in the transverse and descending colon/This is not entirely typical of ischemic colitis or her HIV positive/ disease.  We need to be concerned about other causes  ? COLONOSCOPY  01/19/2011  ? RMR: Internal and external hemorrhoids, likely source of hematochezia anal papilla, otherwise normal rectal mucosa/ Left-sided diverticula.  Cecal polyp with status post cold snare polypectomy.  Remainder of colonic mucosa appeared normal. Pathology with tubular adenoma. Repeat in 2017  ? COLONOSCOPY WITH PROPOFOL N/A 07/15/2020  ? Procedure: COLONOSCOPY WITH PROPOFOL;  Surgeon: Daneil Dolin, MD;  Location: [facility];  Service: Endoscopy;  Laterality: N/A;  8:15AM ?TCS via Ostomy  ? COLOSTOMY N/A 11/12/2018  ? Procedure: COLOSTOMY;  Surgeon: Virl Cagey, MD;  Location: [facility] ORS;  Service: General;  Laterality: N/A;  ? COLOSTOMY REVERSAL N/A 10/02/2020  ? Procedure: COLOSTOMY REVERSAL;  Surgeon: Virl Cagey, MD;  Location: [facility] OR[facility];  Service: General;  Laterality: N/A;  ? ESOPHAGOGASTRODUODENOSCOPY  08/10/2002    ? NUR: Mild changes of reflux esophagitis limited to gastroesophageal  junction  No evidence of ring, stricture, or esophageal candidiasis dysphagia/ Gastritis, possibly H. pylori induced  ? ESOPHAGOGASTRODUODENOSCOPY  12/25/2002  ? NUR: Erosive antral gastritis.  The degree of gastritis is more significant than her last exam/ Normal examination of the esophagus/ Esophageal dilatation performed by passing 62 and [place]  ? ESOPHAGOGASTRODUODENOSCOPY  11/13/2010  ? RMR: Circumferential distal esophageal erosions with soft peptic stricture, consistent with erosive reflux esophagitis or stricture  formation, status post dilation as described above/  Small hiatal hernia/ Tiny antral erosions, otherwise normal stomach D1 and D2  ? ESOPHAGOGASTRODUODENOSCOPY (EGD) WITH PROPOFOL N/A 07/15/2020  ? Procedure: ESOPHAGOGASTRODUODENOSCOPY (EGD) WITH PROPOFOL;  Surgeon: Daneil Dolin, MD;  [facility];  Service: Endoscopy;  Laterality: N/A;  ? FLEXIBLE SIGMOIDOSCOPY N/A 07/15/2020  ? Procedure: FLEXIBLE SIGMOIDOSCOPY;  Surgeon: Daneil Dolin, MD;  [facility];  Service: Endoscopy;  Laterality: N/A;  ? HEMORRHOID SURGERY  09/02/2012  ? Procedure: HEMORRHOIDECTOMY;  Surgeon: Jamesetta So, MD;  Location: [facility];  Service: General;  Laterality: N/A;  ? LAPAROTOMY N/A 11/12/2018  ? Procedure: EXPLORATORY LAPAROTOMY;  Surgeon: Virl Cagey, MD;  Location: [facility];  Service: General;  Laterality: N/A;  ? MALONEY DILATION N/A 07/15/2020  ?  Procedure: MALONEY DILATION;  Surgeon: Daneil Dolin, MD;  [facility];  Service: Endoscopy;  Laterality: N/A;  ? PARTIAL COLECTOMY N/A 11/12/2018  ? Procedure: PARTIAL COLECTOMY;  Surgeon: Virl Cagey, MD;  Location: [facility];  Service: General;  Laterality: N/A;  ? POLYPECTOMY  07/15/2020  ? Procedure: POLYPECTOMY;  Surgeon: Daneil Dolin, MD;  [facility];  Service: Endoscopy;;  ? TUBAL LIGATION    ? ? ?Social History: ? reports that she has never smoked. She has never used smokeless tobacco. She  reports that she does not drink alcohol and does not use drugs. ? ? ?Allergies  ?Allergen Reactions  ? Bee Venom Anaphylaxis  ? Promethazine Swelling  ? Tenofovir Disoproxil Fumarate [Tenofovir Disoproxil Fum

## 2022-01-06 NOTE — Hospital Course (Signed)
[name] is [name] 81 y.o. adult with medical history significant for HIV, hypertension, hyperlipidemia who is admitted to [facility] on 01/05/2022 with acute cystitis after presenting from home to [facility] ED for evaluation of potential UTI.  ?

## 2022-01-06 NOTE — Assessment & Plan Note (Addendum)
triumeq ?Follow viral load per ID request ?  ?  ?  ?  ?

## 2022-01-06 NOTE — Assessment & Plan Note (Signed)
Holding lasix, labetalol, losartan ?

## 2022-01-06 NOTE — Assessment & Plan Note (Addendum)
atorvastatin

## 2022-01-06 NOTE — Assessment & Plan Note (Addendum)
BP in ID office 35'Y systolic ?Improved now ?Will continue to monitor ?Holding her labetalol, losartan, and lasix at this time ?

## 2022-01-06 NOTE — Progress Notes (Signed)
?PROGRESS NOTE ? ? ? ?[name]  NTI:[id number] DOB: May 01, 1941 DOA: 01/05/2022 ?PCP: Wannetta Sender, FNP  ?Chief Complaint  ?Patient presents with  ? Hypotension  ? ? ?Brief Narrative:  ?[name] is [name] 81 y.o. adult with medical history significant for HIV, hypertension, hyperlipidemia who is admitted to [facility] on 01/05/2022 with acute cystitis after presenting from home to [facility] ED for evaluation of potential UTI.   ? ? ?Assessment & Plan: ?  ?Principal Problem: ?  Acute cystitis ?Active Problems: ?  AKI (acute kidney injury) ([place]) ?  Hypotension ?  HIV (human immunodeficiency virus infection) ([place]) ?  HLD (hyperlipidemia) ?  Essential hypertension ? ? ?Assessment and Plan: ?* Acute cystitis ?Urine cx 2/28 with ESBL proteus mirabilis and enterococcus faecalis sensitive to ampicillin ?Appreciate Dr. [name] assistance, recommended meropenem for proteus and ampicillin sensitive enterococcus ?Follow urine culture ? CT with slight urinary bladder wall thickening and perivesicular fat stranding ? ?Hypotension ?BP in ID office 86'P systolic ?Improved now ?Will continue to monitor ?Holding her labetalol, losartan, and lasix at this time ? ?AKI (acute kidney injury) ([place]) ?Related to dehydration/hypovolemia ?CT without hydro ?Gradually improving, will continue IVF ?UA concerning for UTI ?  ?  ? ?HIV (human immunodeficiency virus infection) ([place]) ?triumeq ?Follow viral load per ID request ?  ?  ?  ?  ? ?HLD (hyperlipidemia) ?atorvastatin. ?  ?  ?  ? ?Essential hypertension ?Holding lasix, labetalol, losartan ? ? ?Apparently some question regarding code status, will need to speak with her to clarify further, currently full code based on admission orders ? ?DVT prophylaxis: SCD ?Code Status: full ?Family Communication: none ?Disposition:  ? ?Status is: Inpatient ?Remains inpatient appropriate because: need for IV abx, IVF ?  ?Consultants:  ?none ? ?Procedures:  ?none ? ?Antimicrobials:   ?Anti-infectives (From admission, onward)  ? ? Start     Dose/Rate Route Frequency Ordered Stop  ? 01/07/22 1800  vancomycin (VANCOCIN) IVPB 1000 mg/200 mL premix  Status:  Discontinued       ? 1,000 mg ?200 mL/hr over 60 Minutes Intravenous Every 48 hours 01/05/22 1543 01/06/22 1258  ? 01/06/22 1000  abacavir-dolutegravir-lamiVUDine (TRIUMEQ) 600-50-300 MG per tablet 1 tablet       ? 1 tablet Oral Daily 01/06/22 0639    ? 01/05/22 1600  vancomycin (VANCOREADY) IVPB 1500 mg/300 mL       ? 1,500 mg ?150 mL/hr over 120 Minutes Intravenous  Once 01/05/22 1540 01/05/22 2058  ? 01/05/22 1600  meropenem (MERREM) 500 mg in sodium chloride 0.9 % 100 mL IVPB       ? 500 mg ?200 mL/hr over 30 Minutes Intravenous Every 12 hours 01/05/22 1540    ? ?  ? ? ?Subjective: ?No new complaints ? ?Objective: ?Vitals:  ? 01/06/22 0500 01/06/22 0530 01/06/22 0812 01/06/22 1523  ?BP: (!) 164/73 (!) 163/73 (!) 145/50 (!) 145/53  ?Pulse: 69 69 72 82  ?Resp: (!) '25 18 17 17  '$ ?Temp:  97.8 ?F (36.6 ?C) 98.5 ?F (36.9 ?C) 98.1 ?F (36.7 ?C)  ?SpO2: 94% 97% 98% 97%  ?Weight: 73.7 kg     ?Height:  '5\' 4"'$  (1.626 m)    ? ? ?Intake/Output Summary (Last 24 hours) at 01/06/2022 1924 ?Last data filed at 01/06/2022 1523 ?Gross per 24 hour  ?Intake 680.05 ml  ?Output 900 ml  ?Net -219.95 ml  ? ?Filed Weights  ? 01/06/22 0500  ?Weight: 73.7 kg  ? ? ?  Examination: ? ?General exam: Appears calm and comfortable  ?Respiratory system: unlabored ?Cardiovascular system: RRR ?Gastrointestinal system: Abdomen is nondistended, soft and nontender. ?Central nervous system: Alert and oriented. No focal neurological deficits. ?Extremities: no LEE ?Skin: No rashes, lesions or ulcers ?Psychiatry: Judgement and insight appear normal. Mood & affect appropriate.  ? ? ? ?Data Reviewed: I have personally reviewed following labs and imaging studies ? ?CBC: ?Recent Labs  ?Lab 01/05/22 ?1353 01/06/22 ?0321  ?WBC 5.7 5.8  ?NEUTROABS 2.6 2.7  ?HGB 13.0 12.0  ?HCT 38.9 35.6*  ?MCV 100.0  99.4  ?PLT 168 168  ? ? ?Basic Metabolic Panel: ?Recent Labs  ?Lab 01/05/22 ?1353 01/06/22 ?0321  ?NA 142 138  ?K 5.0 4.7  ?CL 112* 111  ?CO2 23 20*  ?GLUCOSE 109* 99  ?BUN 32* 29*  ?CREATININE 1.84* 1.71*  ?CALCIUM 10.1 9.7  ?MG  --  1.8  ? ? ?GFR: ?Estimated Creatinine Clearance (by C-G formula based on SCr of 1.71 mg/dL (H)) ?Female: 25.8 mL/min ([name]) ?Female: 31.7 mL/min ([name]) ? ?Liver Function Tests: ?Recent Labs  ?Lab 01/06/22 ?0321  ?AST 17  ?ALT 16  ?ALKPHOS 69  ?BILITOT 0.6  ?PROT 5.3*  ?ALBUMIN 3.1*  ? ? ?CBG: ?No results for input(s): GLUCAP in the last 168 hours. ? ? ?Recent Results (from the past 240 hour(s))  ?Blood culture (routine x 2)     Status: None (Preliminary result)  ? Collection Time: 01/05/22  4:03 PM  ? Specimen: BLOOD  ?Result Value Ref Range Status  ? Specimen Description BLOOD BLOOD LEFT FOREARM  Final  ? Special Requests   Final  ?  BOTTLES DRAWN AEROBIC AND ANAEROBIC Blood Culture adequate volume  ? Culture   Final  ?  NO GROWTH < 24 HOURS ?Performed at [facility], [place] [address], [place], [place] [address] ?  ? Report Status PENDING  Incomplete  ?Resp Panel by RT-PCR (Flu [name]&B, Covid) Nasopharyngeal Swab     Status: None  ? Collection Time: 01/05/22  4:41 PM  ? Specimen: Nasopharyngeal Swab; Nasopharyngeal(NP) swabs in vial transport medium  ?Result Value Ref Range Status  ? SARS Coronavirus 2 by RT PCR NEGATIVE NEGATIVE Final  ?  Comment: (NOTE) ?SARS-CoV-2 target nucleic acids are NOT DETECTED. ? ?The SARS-CoV-2 RNA is generally detectable in upper respiratory ?specimens during the acute phase of infection. The lowest ?concentration of SARS-CoV-2 viral copies this assay can detect is ?138 copies/mL. [name] negative result does not preclude SARS-Cov-2 ?infection and should not be used as the sole basis for treatment or ?other patient management decisions. [name] negative result may occur with  ?improper specimen collection/handling, submission of specimen other ?than nasopharyngeal swab,  presence of viral mutation(s) within the ?areas targeted by this assay, and inadequate number of viral ?copies(<138 copies/mL). Bryler Dibble negative result must be combined with ?clinical observations, patient history, and epidemiological ?information. The expected result is Negative. ? ?Fact Sheet for Patients:  ?EntrepreneurPulse.com.au ? ?Fact Sheet for Healthcare Providers:  ?IncredibleEmployment.be ? ?This test is no t yet approved or cleared by the [place] FDA and  ?has been authorized for detection and/or diagnosis of SARS-CoV-2 by ?FDA under an Emergency Use Authorization (EUA). This EUA will remain  ?in effect (meaning this test can be used) for the duration of the ?COVID-19 declaration under Section 564(b)(1) of the Act, 21 ?U.S.C.section 360bbb-3(b)(1), unless the authorization is terminated  ?or revoked sooner.  ? ? ?  ? Influenza Zaria Taha by PCR NEGATIVE NEGATIVE Final  ?  Influenza B by PCR NEGATIVE NEGATIVE Final  ?  Comment: (NOTE) ?The Xpert Xpress SARS-CoV-2/FLU/RSV plus assay is intended as an aid ?in the diagnosis of influenza from Nasopharyngeal swab specimens and ?should not be used as Kenyatte Chatmon sole basis for treatment. Nasal washings and ?aspirates are unacceptable for Xpert Xpress SARS-CoV-2/FLU/RSV ?testing. ? ?Fact Sheet for Patients: ?EntrepreneurPulse.com.au ? ?Fact Sheet for Healthcare Providers: ?IncredibleEmployment.be ? ?This test is not yet approved or cleared by the [place] FDA and ?has been authorized for detection and/or diagnosis of SARS-CoV-2 by ?FDA under an Emergency Use Authorization (EUA). This EUA will remain ?in effect (meaning this test can be used) for the duration of the ?COVID-19 declaration under Section 564(b)(1) of the Act, 21 U.S.C. ?section 360bbb-3(b)(1), unless the authorization is terminated or ?revoked. ? ?Performed at [facility], [place] [address] [place], [place] ?[address] ?  ?   ? ? ? ? ? ?Radiology Studies: ?CT ABDOMEN PELVIS WO CONTRAST ? ?Result Date: 01/05/2022 ?CLINICAL DATA:  Bowel obstruction suspected. hypotensive in the 90s with them, pt normotensive upon EMS arrival. EXAM: CT ABDOMEN AND PELVIS W

## 2022-01-06 NOTE — ED Notes (Signed)
Report given  to RN justin on 6N.

## 2022-01-06 NOTE — ED Notes (Signed)
Breakfast order placed ?

## 2022-01-06 NOTE — Assessment & Plan Note (Addendum)
Related to dehydration/hypovolemia ?CT without hydro ?Gradually improving, will continue IVF ?UA concerning for UTI ?  ?  ?

## 2022-01-06 NOTE — Progress Notes (Signed)
Initial Nutrition Assessment ? ?DOCUMENTATION CODES:  ? ?Not applicable ? ?INTERVENTION:  ?- Encourage adequate PO intake ? ?- Ensure Enlive po BID, each supplement provides 350 kcal and 20 grams of protein. ? ?- MVI with minerals daily ? ?NUTRITION DIAGNOSIS:  ? ?Increased nutrient needs related to acute illness as evidenced by estimated needs. ? ?GOAL:  ? ?Patient will meet greater than or equal to 90% of their needs ? ?MONITOR:  ? ?PO intake, Supplement acceptance, Labs, Weight trends ? ?REASON FOR ASSESSMENT:  ? ?Malnutrition Screening Tool ?  ? ?ASSESSMENT:  ? ?Pt admitted from home with acute cystitis after presenting for evaluation of borderline hypotension, AKI, evaluation of possible UTI. PMH includes HIV, HTN, and HLD ? ?Pt reports that recently she has had a decrease in appetite within the past 3 week d/t feelings of depression. She states that she has 3 meals per day but only takes a few bites of each meal in addition to drinking 2-3 Ensure daily. This morning she reports eating most of her breakfast consisting of grits, potatoes and eggs. 90% meal completion of breakfast documented this morning.  ? ?She states that her usual weight is ~166 lbs and has a current weight of about 155 lbs. Reviewed recent documented weight history. Unable to confirm this weight loss. Weights appear to be stable with current weight of 162 lbs. Will continue to monitor throughout admission. ? ?Medications: remeron, IV abx ? ?Labs: BUN 29, Cr 1.71 ? ?NUTRITION - FOCUSED PHYSICAL EXAM: ? ?Flowsheet Row Most Recent Value  ?Orbital Region No depletion  ?Upper Arm Region No depletion  ?Thoracic and Lumbar Region No depletion  ?Buccal Region No depletion  ?Temple Region Mild depletion  ?Clavicle Bone Region Mild depletion  ?Clavicle and Acromion Bone Region No depletion  ?Scapular Bone Region No depletion  ?Dorsal Hand Mild depletion  ?Patellar Region Mild depletion  ?Anterior Thigh Region Mild depletion  ?Posterior Calf Region  Moderate depletion  ?Edema (RD Assessment) None  ?Hair Reviewed  ?Eyes Reviewed  ?Mouth Reviewed  ?Skin Reviewed  ?Nails Reviewed  ? ?  ? ? ?Diet Order:   ?Diet Order   ? ?       ?  Diet regular Room service appropriate? Yes; Fluid consistency: Thin  Diet effective now       ?  ? ?  ?  ? ?  ? ? ?EDUCATION NEEDS:  ? ?Education needs have been addressed ? ?Skin:  Skin Assessment: Reviewed RN Assessment ? ?Last BM:  3/12 ? ?Height:  ? ?Ht Readings from Last 1 Encounters:  ?01/06/22 '5\' 4"'$  (1.626 m)  ? ? ?Weight:  ? ?Wt Readings from Last 1 Encounters:  ?01/06/22 73.7 kg  ? ? ?BMI:  Body mass index is 27.89 kg/m?. ? ?Estimated Nutritional Needs:  ? ?Kcal:  1700-1900 ? ?Protein:  90-105g ? ?Fluid:  >/=1.7L ? ?Clayborne Dana, RDN, LDN ?Clinical Nutrition ?

## 2022-01-07 DIAGNOSIS — N3000 Acute cystitis without hematuria: Secondary | ICD-10-CM | POA: Diagnosis not present

## 2022-01-07 LAB — HIV-1 RNA QUANT-NO REFLEX-BLD
HIV 1 RNA Quant: <20 copies/mL
LOG10 HIV-1 RNA: UNDETERMINED log10copy/mL

## 2022-01-07 MED ORDER — DOCUSATE SODIUM 100 MG PO CAPS
100.0000 mg | ORAL_CAPSULE | Freq: Two times a day (BID) | ORAL | Status: DC
  Administered 2022-01-07 – 2022-01-11 (×10): 100 mg via ORAL
  Filled 2022-01-07 (×11): qty 1

## 2022-01-07 MED ORDER — MAGNESIUM OXIDE -MG SUPPLEMENT 400 (240 MG) MG PO TABS
200.0000 mg | ORAL_TABLET | Freq: Every day | ORAL | Status: DC
  Administered 2022-01-07 – 2022-01-12 (×6): 200 mg via ORAL
  Filled 2022-01-07 (×6): qty 1

## 2022-01-07 MED ORDER — VITAMIN B-1 50 MG PO TABS
100.0000 mg | ORAL_TABLET | Freq: Every day | ORAL | Status: DC
Start: 2022-01-08 — End: 2022-01-12
  Administered 2022-01-08 – 2022-01-12 (×5): 100 mg via ORAL
  Filled 2022-01-07 (×5): qty 2

## 2022-01-07 MED ORDER — VITAMIN B-1 100 MG PO TABS
100.0000 mg | ORAL_TABLET | Freq: Every day | ORAL | Status: DC
  Administered 2022-01-07: 100 mg via ORAL
  Filled 2022-01-07 (×2): qty 1

## 2022-01-07 MED ORDER — MIRABEGRON ER 25 MG PO TB24
25.0000 mg | ORAL_TABLET | Freq: Every day | ORAL | Status: DC
  Administered 2022-01-07 – 2022-01-12 (×6): 25 mg via ORAL
  Filled 2022-01-07 (×6): qty 1

## 2022-01-07 MED ORDER — DULOXETINE HCL 60 MG PO CPEP
60.0000 mg | ORAL_CAPSULE | Freq: Every day | ORAL | Status: DC
  Administered 2022-01-07 – 2022-01-12 (×6): 60 mg via ORAL
  Filled 2022-01-07 (×6): qty 1

## 2022-01-07 MED ORDER — OXYCODONE HCL 5 MG PO TABS: 5.0000 mg | ORAL_TABLET | Freq: Two times a day (BID) | ORAL | Status: DC | PRN

## 2022-01-07 MED ORDER — PANTOPRAZOLE SODIUM 40 MG PO TBEC
40.0000 mg | DELAYED_RELEASE_TABLET | Freq: Every day | ORAL | Status: DC
  Administered 2022-01-08 – 2022-01-12 (×5): 40 mg via ORAL
  Filled 2022-01-07 (×5): qty 1

## 2022-01-07 NOTE — Evaluation (Signed)
Physical Therapy Evaluation ?Patient Details ?Name: [name] ?MRN: [id number] ?DOB: 1941/01/10 ?Today's Date: 01/07/2022 ? ?History of Present Illness ? Pt is an 81 y.o. female admitted from [facility] on 01/05/22 with c/o dizziness, hypotension; workup for recurrent UTI, acute cystitis. PMH includes dementia, HIV, HTN, HLD, CKD, TIA/stroke, peripheral neuropathy, COPD, partial colectomy, anxiety, depression. ?  ?Clinical Impression ? Pt presents with an overall decrease in functional mobility secondary to above. PTA, pt reports living at [facility] after completing short-term rehab there; pt reports requiring assist +1-2 for transfers, has not been ambulating since 05/2021. Today, pt requiring modA for step pivot transfer with RW; pt with significant fear of falling. Pt would benefit from continued acute PT services to maximize functional mobility and independence prior to d/c with SNF-level therapies, unless LTC able to provide necessary assist.     ? ?Recommendations for follow up therapy are one component of a multi-disciplinary discharge planning process, led by the attending physician.  Recommendations may be updated based on patient status, additional functional criteria and insurance authorization. ? ?Follow Up Recommendations Skilled nursing-short term rehab (<3 hours/day) (vs. return to LTC) ? ?  ?Assistance Recommended at Discharge Frequent or constant Supervision/Assistance  ?Patient can return home with the following ? A lot of help with walking and/or transfers;A lot of help with bathing/dressing/bathroom;Assist for transportation;Help with stairs or ramp for entrance;Direct supervision/assist for medications management;Direct supervision/assist for financial management ? ?  ?Equipment Recommendations None recommended by PT  ?Recommendations for Other Services ?    ?  ?Functional Status Assessment Patient has had a recent decline in their functional status and demonstrates the ability to make significant  improvements in function in a reasonable and predictable amount of time.  ? ?  ?Precautions / Restrictions Precautions ?Precautions: Fall;Other (comment) ?Precaution Comments: urine incontinence, fear of falling, tremors ?Restrictions ?Weight Bearing Restrictions: No  ? ?  ? ?Mobility ? Bed Mobility ?Overal bed mobility: Needs Assistance ?Bed Mobility: Supine to Sit ?  ?  ?Supine to sit: Supervision, HOB elevated ?  ?  ?  ?  ? ?Transfers ?Overall transfer level: Needs assistance ?Equipment used: Rolling walker (2 wheels) ?Transfers: Sit to/from Stand, Bed to chair/wheelchair/BSC ?Sit to Stand: Mod assist ?  ?Step pivot transfers: Mod assist ?  ?  ?  ?General transfer comment: Multiple sit<>stands from EOB and recliner to RW with min-modA for trunk elevation and stability, repeated cues for sequencing and hand placement, assist to stabilize RW as pt tremulous; pivotal steps from bed to recliner with max, multimodal cues for sequencing, modA for stability and RW management; pt fearful of falling requiring frequent redirection and encouragement ?  ? ?Ambulation/Gait ?  ?  ?  ?  ?  ?  ?  ?  ? ?Stairs ?  ?  ?  ?  ?  ? ?Wheelchair Mobility ?  ? ?Modified Rankin (Stroke Patients Only) ?  ? ?  ? ?Balance Overall balance assessment: Needs assistance ?  ?Sitting balance-Leahy Scale: Fair ?Sitting balance - Comments: prolonged sitting EOB without UE support, requires assist to don shoes/socks ?  ?Standing balance support: Reliant on assistive device for balance, Bilateral upper extremity supported, During functional activity ?Standing balance-Leahy Scale: Poor ?Standing balance comment: reliant on BUE support and external assist ?  ?  ?  ?  ?  ?  ?  ?  ?  ?  ?  ?   ? ? ? ?Pertinent Vitals/Pain Pain Assessment ?Pain Assessment: No/denies pain  ? ? ?  Home Living Family/patient expects to be discharged to:: Skilled nursing facility ?  ?  ?  ?  ?  ?  ?  ?  ?  ?Additional Comments: Per chart, pt from [facility]; pt does  not want to return there  ?  ?Prior Function Prior Level of Function : Needs assist ?  ?  ?  ?  ?  ?  ?Mobility Comments: Reports assist +1-2 for OOB, including transfers to/from wheelchair with walker; pt reports difficulty walking/unable to walk since going there (05/2021) ?ADLs Comments: Staff assists with ADLs ?  ? ? ?Hand Dominance  ?   ? ?  ?Extremity/Trunk Assessment  ? Upper Extremity Assessment ?Upper Extremity Assessment: Generalized weakness (tremulous with mobility) ?  ? ?Lower Extremity Assessment ?Lower Extremity Assessment: RLE deficits/detail;LLE deficits/detail;Generalized weakness ?RLE Deficits / Details: noted bilateral PF contractures; functionally at least 3/5 hip and knee strength; tremulous with mobility ?LLE Deficits / Details: noted bilateral PF contractures; functionally at least 3/5 hip and knee strength; tremulous with mobility ?  ? ?   ?Communication  ?    ?Cognition Arousal/Alertness: Awake/alert ?Behavior During Therapy: [facility] for tasks assessed/performed ?Overall Cognitive Status: History of cognitive impairments - at baseline ?  ?  ?  ?  ?  ?  ?  ?  ?  ?  ?  ?  ?  ?  ?  ?  ?General Comments: per chart, h/o dementia. Pt pleasant and seems to answer questions appropriately; following majority of simple commands well. At end of session, pt reports, "I'm talking through the CB... to someone through the air..." asked pt to explain this, pt reports, "No one understands, they think I'm crazy" ?  ?  ? ?  ?General Comments   ? ?  ?Exercises    ? ?Assessment/Plan  ?  ?PT Assessment Patient needs continued PT services  ?PT Problem List Decreased strength;Decreased activity tolerance;Decreased balance;Decreased mobility;Decreased knowledge of use of DME;Decreased knowledge of precautions;Cardiopulmonary status limiting activity;Decreased cognition ? ?   ?  ?PT Treatment Interventions DME instruction;Gait training;Functional mobility training;Therapeutic activities;Therapeutic exercise;Balance  training;Patient/family education;Cognitive remediation   ? ?PT Goals (Current goals can be found in the Care Plan section)  ?Acute Rehab PT Goals ?Patient Stated Goal: "I don't want to go back to [facility]" ?PT Goal Formulation: With patient ?Time For Goal Achievement: 01/21/22 ?Potential to Achieve Goals: Fair ? ?  ?Frequency Min 3X/week ?  ? ? ?Co-evaluation   ?  ?  ?  ?  ? ? ?  ?AM-PAC PT "6 Clicks" Mobility  ?Outcome Measure Help needed turning from your back to your side while in a flat bed without using bedrails?: A Little ?Help needed moving from lying on your back to sitting on the side of a flat bed without using bedrails?: A Little ?Help needed moving to and from a bed to a chair (including a wheelchair)?: A Lot ?Help needed standing up from a chair using your arms (e.g., wheelchair or bedside chair)?: A Lot ?Help needed to walk in hospital room?: Total ?Help needed climbing 3-5 steps with a railing? : Total ?6 Click Score: 12 ? ?  ?End of Session Equipment Utilized During Treatment: Gait belt ?Activity Tolerance: Patient tolerated treatment well;Patient limited by fatigue ?Patient left: in chair;with call bell/phone within reach;with chair alarm set ?Nurse Communication: Mobility status ?PT Visit Diagnosis: Other abnormalities of gait and mobility (R26.89);Muscle weakness (generalized) (M62.81) ?  ? ?Time: 0076-2263 ?PT Time Calculation (min) (ACUTE  ONLY): 26 min ? ? ?Charges:   PT Evaluation ?$PT Eval Moderate Complexity: 1 Mod ?  ?  ?   ?Mabeline Caras, PT, DPT ?Acute Rehabilitation Services  ?Pager [phone] ?Office [phone] ? ?Derry Lory ?01/07/2022, 1:33 PM ? ?

## 2022-01-07 NOTE — Consult Note (Signed)
? ? ? ?[facility] for Infectious Disease   ? ?Date of Admission:  01/05/2022    ? ?Total days of antibiotics 3 ?        ?      ?Reason for Consult: Recurrent UTI   ?Referring Provider: Dr. Pietro Cassis ?Primary Care Provider: Wannetta Sender, FNP ? ? ?ASSESSMENT: ? ?[name] is an 81 y/o female admitted with recurrent UTI and hypotension. Current urine cultures are growing gram negative rods with cultures pending. Anticipate ESBL Proteus mirabilis similar to previous cultures. Suspect hypotension likely related to dehydration as opposed to infection with dysuria related to interstitial cystis. Continue current dose of meropenem through today for total of 3 days of treatment. Continue current dose of Triumeq for ART. Remaining medical and supportive care per primary team.  ? ?PLAN: ? ?Continue meropenem through the end of the day for total of 3 days.  ?Monitor cultures for organism identification ?Continue Triumeq for ART therapy for HIV.  ?Remaining medical and supportive care per Primary Team. ? ?ID will sign off. Please re-consult if needed.  ? ? ?Principal Problem: ?  Acute cystitis ?Active Problems: ?  HIV (human immunodeficiency virus infection) ([place]) ?  HLD (hyperlipidemia) ?  Essential hypertension ?  AKI (acute kidney injury) ([place]) ?  Hypotension ? ? ? abacavir-dolutegravir-lamiVUDine  1 tablet Oral Daily  ? ARIPiprazole  10 mg Oral Daily  ? atorvastatin  20 mg Oral Daily  ? feeding supplement  237 mL Oral BID BM  ? fluticasone  2 spray Each Nare QHS  ? mirtazapine  15 mg Oral QHS  ? multivitamin with minerals  1 tablet Oral Daily  ? polyethylene glycol  17 g Oral Daily  ? polyvinyl alcohol  1 drop Both Eyes Daily  ? ? ? ?HPI: [name] is a 81 y.o. adult with previous medical history as detailed below and significant for CKD Stage 3, HIV and recurrent UTI admitted with acute cystitis. ? ?[name] was recently seen by Dr. [name] at the [facility] on 01/05/22 with well controlled HIV  disease on Triumeq and had complaints of dizziness with ongoing dysuria. Seen by Urology on 12/23/21 with dysuria, urinary frequency and urgency . Diagnosed with recurrent UTI and urinary incontinence and was started on Myrbetriq.  Urine culture from 12/23/21 had grown ESBL Proteus mirabilis and Ampicillin sensitive Enterococcus faecalis. Initially started on Bactrim. She was hypotensive during her clinic visit  and appeared to have lost 15-16 pounds since 2021 and recommended to go to the hospital for further evaluation.  ? ?[name] has been afebrile since admission. Chest x-ray with no evidence of cardiopulmonary process and CT abdomen/pelvis with slight bladder wall thickening and perivascular fat stranding. Urinalysis with >50 WBC and protein. UC collected are growing 100,000 colonies of gram negative rods with cultures pending. Blood cultures have been without growth to date. Initially given vancomycin and meropenem. Currently on Day 3 of meropenem. CD4 count is 633 with HIV RNA level pending although previously undetectable.  ? ?Review of Systems: ?Review of Systems  ?Constitutional:  Negative for chills, fever and weight loss.  ?Eyes:   ?     No changes in vision  ?Respiratory:  Negative for cough, shortness of breath and wheezing.   ?Cardiovascular:  Negative for chest pain and leg swelling.  ?Gastrointestinal:  Negative for abdominal pain, constipation, diarrhea, nausea and vomiting.  ?Genitourinary:  Positive for dysuria. Negative for flank pain and hematuria.  ?Skin:  Negative for rash.  ?Neurological:  Positive for headaches.  ? ? ?Past Medical History:  ?Diagnosis Date  ? Anxiety   ? Arthritis   ? Bronchitis   ? Bronchitis   ? CKD (chronic kidney disease) stage 3, GFR 30-59 ml/min (HCC) 05/04/2016  ? Complication of anesthesia   ? pt states she had a cardiac arrest during hysterectomy 1985.  Pt had several surgies since then that were uneventful  ? Confusion 01/05/2022  ? COPD (chronic obstructive  pulmonary disease) ([place])   ? Dementia ([place]) 01/05/2022  ? Depression   ? Dizziness 01/05/2022  ? Dysuria 03/27/2019  ? Elevated LFTs 05/04/2016  ? History of TIAs 1981  ? left side weakness  ? HIV (human immunodeficiency virus infection) ([place])   ? Hypertension   ? Kidney disease related to HIV infection ([place])   ? pt states she has to have her creatinine level checked often   ? Low blood pressure 01/05/2022  ? Moderate single current episode of major depressive disorder ([place]) 03/12/2021  ? Nausea 03/03/2017  ? Peripheral neuropathy   ? bilateral feet  ? Seasonal allergies 03/27/2019  ? Sinusitis 05/06/2018  ? UTI (urinary tract infection) 03/03/2017  ? Weight loss 03/03/2017  ? ? ?Social History  ? ?Tobacco Use  ? Smoking status: Never  ? Smokeless tobacco: Never  ?Vaping Use  ? Vaping Use: Never used  ?Substance Use Topics  ? Alcohol use: No  ? Drug use: No  ? ? ?Family History  ?Problem Relation Age of Onset  ? Diabetes Mother   ? Diabetes Brother   ? Colon cancer Brother   ?     Diagnosed > age 58  ? ? ?Allergies  ?Allergen Reactions  ? Bee Venom Anaphylaxis  ? Promethazine Swelling  ? Tenofovir Disoproxil Fumarate [Tenofovir Disoproxil Fumarate] Other (See Comments)  ?  Renal failure, (08/27/2006)  ? Compazine  [Prochlorperazine Edisylate] Nausea And Vomiting  ? Haloperidol Lactate Other (See Comments)  ?  Reaction is muscle tension, causes severe spasms in face and neck. Forces eyes to roll in the back of the head  ? Sulfa Antibiotics Diarrhea  ? Brompheniramine-Acetaminophen Other (See Comments)  ?  unknown  ? Chlorcyclizine Other (See Comments)  ?  Unknown  ? Chlorpromazine Other (See Comments)  ?  Unknown  ? Codeine Itching and Nausea And Vomiting  ? Navane [Thiothixene] Other (See Comments) and Nausea And Vomiting  ?  Same muscle spasm reaction as haldol  ? Prednisone Other (See Comments)  ?  Brief mild psychosis and agitation  ? Promethazine Hcl Other (See Comments)  ? Propoxyphene N-Acetaminophen Itching and Nausea And  Vomiting  ? Morphine Itching and Swelling  ? ? ?OBJECTIVE: ?Blood pressure (!) 180/69, pulse 72, temperature 98.1 ?F (36.7 ?C), temperature source Oral, resp. rate 18, height '5\' 4"'$  (1.626 m), weight 72.3 kg, SpO2 98 %. ? ?Physical Exam ?Constitutional:   ?   General: She is not in acute distress. ?   Appearance: She is well-developed.  ?   Comments: Lying in bed with head of bed elevated; pleasant.   ?Cardiovascular:  ?   Rate and Rhythm: Normal rate and regular rhythm.  ?   Heart sounds: Normal heart sounds.  ?Pulmonary:  ?   Effort: Pulmonary effort is normal.  ?   Breath sounds: Normal breath sounds.  ?Skin: ?   General: Skin is warm and dry.  ?Neurological:  ?   Mental Status: She is alert.  ? ? ?  Lab Results ?Lab Results  ?Component Value Date  ? WBC 5.8 01/06/2022  ? HGB 12.0 01/06/2022  ? HCT 35.6 (L) 01/06/2022  ? MCV 99.4 01/06/2022  ? PLT 168 01/06/2022  ?  ?Lab Results  ?Component Value Date  ? CREATININE 1.71 (H) 01/06/2022  ? BUN 29 (H) 01/06/2022  ? NA 138 01/06/2022  ? K 4.7 01/06/2022  ? CL 111 01/06/2022  ? CO2 20 (L) 01/06/2022  ?  ?Lab Results  ?Component Value Date  ? ALT 16 01/06/2022  ? AST 17 01/06/2022  ? ALKPHOS 69 01/06/2022  ? BILITOT 0.6 01/06/2022  ?  ? ?Microbiology: ?Recent Results (from the past 240 hour(s))  ?Urine Culture     Status: Abnormal (Preliminary result)  ? Collection Time: 01/05/22  3:00 PM  ? Specimen: In/Out Cath Urine  ?Result Value Ref Range Status  ? Specimen Description IN/OUT CATH URINE  Final  ? Special Requests NONE  Final  ? Culture (A)  Final  ?  >=100,000 COLONIES/mL GRAM NEGATIVE RODS ?SUSCEPTIBILITIES TO FOLLOW ?Performed at [facility], [place] [address], [place], [place] [address] ?  ? Report Status PENDING  Incomplete  ?Blood culture (routine x 2)     Status: None (Preliminary result)  ? Collection Time: 01/05/22  4:03 PM  ? Specimen: BLOOD  ?Result Value Ref Range Status  ? Specimen Description BLOOD BLOOD LEFT FOREARM  Final  ? Special Requests    Final  ?  BOTTLES DRAWN AEROBIC AND ANAEROBIC Blood Culture adequate volume  ? Culture   Final  ?  NO GROWTH < 24 HOURS ?Performed at [facility], [place] [address] [place], [place] [address] ?  ? Report Status P

## 2022-01-07 NOTE — [place] FL2 (Signed)
?[place] MEDICAID FL2 LEVEL OF CARE SCREENING TOOL  ?  ? ?IDENTIFICATION  ?Patient Name: ?[name] Birthdate: 1941/07/30 Sex: adult Admission Date (Current Location): ?01/05/2022  ?[place] and [place] Number: ? [place] ?  Facility and Address:  ?[facility], [place] [address], [place], [place] [address] ?     Provider Number: ?[id number]  ?Attending Physician Name and Address:  ?Terrilee Croak, MD ? Relative Name and Phone Number:  ?  ?   ?Current Level of Care: ?Hospital Recommended Level of Care: ?[place] Prior Approval Number: ?  ? ?Date Approved/Denied: ?  PASRR Number: ?[id number] B ? ?Discharge Plan: ?SNF ?  ? ?Current Diagnoses: ?Patient Active Problem List  ? Diagnosis Date Noted  ? Hypotension 01/06/2022  ? Low blood pressure 01/05/2022  ? Dementia ([place]) 01/05/2022  ? Confusion 01/05/2022  ? Dizziness 01/05/2022  ? Acute cystitis 01/05/2022  ? Abdominal pain 06/21/2021  ? Hypercalcemia 06/21/2021  ? Serum total bilirubin elevated 06/21/2021  ? History of schizophrenia 06/21/2021  ? SBO (small bowel obstruction) ([place]) 06/21/2021  ? Generalized weakness 05/10/2021  ? Elevated MCV 05/10/2021  ? Moderate single current episode of major depressive disorder ([place]) 03/12/2021  ? Incontinence of urine in female 11/21/2020  ? Skin yeast infection 11/21/2020  ? Mass of left side of neck 09/12/2020  ? Colostomy in place [facility]) 09/12/2020  ? Housing problems 06/06/2020  ? Healthcare maintenance 06/06/2020  ? Abdominal wall bulge 04/22/2020  ? AKI (acute kidney injury) ([place])   ? History of colonic polyps 07/06/2019  ? History of partial colectomy 07/06/2019  ? Seasonal allergies 03/27/2019  ? Dysuria 03/27/2019  ? Obesity (BMI 30-39.9) 11/12/2018  ? Constipation due to opioid therapy 11/12/2018  ? Lactic acidosis 11/12/2018  ? Large bowel perforation ([place]) 11/12/2018  ? Peritonitis ([place]) 11/12/2018  ? Stercoral ulcer of large intestine 11/12/2018  ?  Pneumoperitoneum   ? COPD (chronic obstructive pulmonary disease) ([place]) 12/17/2017  ? Hypothyroidism 12/17/2017  ? Steroid-induced psychosis, with hallucinations ([place])   ? UTI (urinary tract infection) 03/03/2017  ? Nausea & vomiting 03/03/2017  ? Weight loss 03/03/2017  ? CAP (community acquired pneumonia) 07/22/2016  ? CKD (chronic kidney disease) stage 3, GFR 30-59 ml/min (HCC) 05/04/2016  ? Elevated LFTs 05/04/2016  ? 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 Breath shortness 06/19/2014  ? Syncope 05/31/2014  ? Rib fracture 05/31/2014  ? Weight gain 03/21/2014  ? Cervical spondylosis 08/15/2013  ? Neck pain on left side 05/24/2013  ? Unspecified vitamin D deficiency 12/14/2012  ? Vitamin D toxicity 12/14/2012  ? Hemorrhoid 09/01/2012  ? Myalgia 02/25/2012  ? Essential hypertension 11/25/2011  ? Cramps, muscle, general 08/18/2011  ? RECTAL BLEEDING 11/03/2010  ? Esophageal dysphagia 11/03/2010  ? FATIGUE 08/29/2010  ? MUSCLE PAIN 06/04/2010  ? Cystitis 05/12/2010  ? RENAL INSUFFICIENCY 12/18/2008  ? DEPRESSION 09/04/2008  ? HLD (hyperlipidemia) 05/22/2008  ? IRRITABLE BOWEL SYNDROME 05/22/2008  ? HERNIATED CERVICAL DISC 10/05/2007  ? HERNIATED LUMBAR DISC 10/05/2007  ? History of urinary tract infection 09/20/2007  ? HIV-1 associated autonomic neuropathy ([place]) 03/16/2007  ? ELBOW PAIN 03/16/2007  ? ISCHEMIC COLITIS 12/27/2006  ? Acute kidney injury superimposed on CKD ([place]) 12/27/2006  ? HIV (human immunodeficiency virus infection) ([place]) 08/27/2006  ? ? ?Orientation RESPIRATION BLADDER Height & Weight   ?  ?Self, Time, Situation, Place ? Normal Continent Weight: 159 lb 6.3 oz (72.3 kg) ?Height:  '5\' 4"'$  (162.6 cm)  ?BEHAVIORAL SYMPTOMS/MOOD  NEUROLOGICAL BOWEL NUTRITION STATUS  ?    Continent Diet  ?AMBULATORY STATUS COMMUNICATION OF NEEDS Skin   ?Extensive Assist Verbally Normal ?  ?  ?  ?    ?     ?     ? ? ?Personal Care Assistance Level of Assistance  ?Bathing, Feeding, Dressing Bathing Assistance: Maximum  assistance ?Feeding assistance: Limited assistance ?Dressing Assistance: Maximum assistance ?   ? ?Functional Limitations Info  ?Sight, Hearing, Speech Sight Info: Adequate ?Hearing Info: Adequate ?Speech Info: Adequate  ? ? ?SPECIAL CARE FACTORS FREQUENCY  ?    ?  ?  ?  ?  ?  ?  ?   ? ? ?Contractures Contractures Info: Not present  ? ? ?Additional Factors Info  ?Code Status, Allergies Code Status Info: Full ?Allergies Info: Bee Venom   Promethazine   Tenofovir Disoproxil Fumarate (Tenofovir Disoproxil Fumarate)   Compazine  (Prochlorperazine Edisylate)   Haloperidol Lactate   Sulfa Antibiotics   Brompheniramine-acetaminophen   Chlorcyclizine   Chlorpromazine   Codeine   Navane (Thiothixene)   Prednisone   Promethazine Hcl   Propoxyphene N-acetaminophen   Morphine ?  ?  ?  ?   ? ?Current Medications (01/07/2022):  This is the current hospital active medication list ?Current Facility-Administered Medications  ?Medication Dose Route Frequency Provider Last Rate Last Admin  ? abacavir-dolutegravir-lamiVUDine (TRIUMEQ) 600-50-300 MG per tablet 1 tablet  1 tablet Oral Daily Howerter, Justin B, DO   1 tablet at 01/07/22 1238  ? acetaminophen (TYLENOL) tablet 650 mg  650 mg Oral Q6H PRN Howerter, Justin B, DO      ? Or  ? acetaminophen (TYLENOL) suppository 650 mg  650 mg Rectal Q6H PRN Howerter, Justin B, DO      ? ARIPiprazole (ABILIFY) tablet 10 mg  10 mg Oral Daily Howerter, Justin B, DO   10 mg at 01/07/22 0840  ? atorvastatin (LIPITOR) tablet 20 mg  20 mg Oral Daily Howerter, Justin B, DO   20 mg at 01/07/22 0839  ? clonazePAM (KLONOPIN) tablet 1 mg  1 mg Oral BID PRN Howerter, Justin B, DO   1 mg at 01/05/22 2139  ? docusate sodium (COLACE) capsule 100 mg  100 mg Oral BID Terrilee Croak, MD   100 mg at 01/07/22 1238  ? DULoxetine (CYMBALTA) DR capsule 60 mg  60 mg Oral Daily Dahal, Marlowe Aschoff, MD   60 mg at 01/07/22 1238  ? feeding supplement (ENSURE ENLIVE / ENSURE PLUS) liquid 237 mL  237 mL Oral BID BM Howerter, Justin  B, DO   237 mL at 01/07/22 0840  ? fluticasone (FLONASE) 50 MCG/ACT nasal spray 2 spray  2 spray Each Nare QHS Howerter, Justin B, DO      ? magnesium oxide (MAG-OX) tablet 200 mg  200 mg Oral Daily Dahal, Binaya, MD   200 mg at 01/07/22 1238  ? meropenem (MERREM) 500 mg in sodium chloride 0.9 % 100 mL IVPB  500 mg Intravenous Q12H Thayer Headings, MD 200 mL/hr at 01/07/22 0846 500 mg at 01/07/22 0846  ? mirabegron ER (MYRBETRIQ) tablet 25 mg  25 mg Oral Daily Dahal, Binaya, MD   25 mg at 01/07/22 1239  ? mirtazapine (REMERON) tablet 15 mg  15 mg Oral QHS Howerter, Justin B, DO   15 mg at 01/06/22 2104  ? multivitamin with minerals tablet 1 tablet  1 tablet Oral Daily Elodia Florence., MD   1 tablet at 01/07/22 [phone]  ?  oxyCODONE (Oxy IR/ROXICODONE) immediate release tablet 5 mg  5 mg Oral BID PRN Terrilee Croak, MD      ? Derrill Memo ON 01/08/2022] pantoprazole (PROTONIX) EC tablet 40 mg  40 mg Oral QAC breakfast Dahal, Binaya, MD      ? polyethylene glycol (MIRALAX / GLYCOLAX) packet 17 g  17 g Oral Daily Elodia Florence., MD   17 g at 01/07/22 [phone]  ? polyvinyl alcohol (LIQUIFILM TEARS) 1.4 % ophthalmic solution 1 drop  1 drop Both Eyes Daily Howerter, Justin B, DO   1 drop at 01/07/22 1239  ? [START ON 01/08/2022] thiamine (VITAMIN B-1) tablet 100 mg  100 mg Oral Daily Dahal, Marlowe Aschoff, MD      ? ? ? ?Discharge Medications: ?Please see discharge summary for a list of discharge medications. ? ?Relevant Imaging Results: ? ?Relevant Lab Results: ? ? ?Additional Information ?Pt SSN: [id number] ? ?Emeterio Reeve, LCSW ? ? ? ? ?

## 2022-01-07 NOTE — Progress Notes (Signed)
?PROGRESS NOTE ? ?[name]  ?DOB: 28-Jun-1941  ?PCP: Wannetta Sender, FNP ?ULA:[id number]  ?DOA: 01/05/2022 ? LOS: 2 days  ?Hospital Day: 3 ? ?Brief narrative: ?[name] is a 81 y.o. adult with PMH significant for dementia, HIV, HTN, HLD, CKD, TIA/stroke, peripheral neuropathy, orthostatic hypotension, COPD, anxiety/depression, history of partial colectomy who is a nursing home resident. ? ?She has recurrent UTI, recently evaluated by urology on 12/23/2021.  Urine culture performed at that time grew out Proteus and Enterococcus faecalis with multiple resistances to oral antibiotics.  She was tried on Bactrim oral but she continued to have increasing confusion, dysuria. ?Patient was seen by ID in the office on 3/13.  She also mentioned dizziness on standing up.  She was noted to be hypotensive and was sent to the ED for IV antibiotics and hydration. ? ?In the ED patient was afebrile ?Urinalysis showed cloudy amber color urine with moderate amount of leukocytes, positive nitrite and many bacteria. ?Repeat urine culture was sent. ?CT abdomen pelvis showed slight urinary bladder wall thickening and perivesicular fat stranding ?Admitted to hospitalist service ? ?Subjective: ?Patient was seen and examined this morning.  Pleasant elderly Caucasian female.  Propped up in bed.  Not in distress.  Family not at bedside ?Chart reviewed ?Last 24 hours, patient had no fever, blood pressure elevated to 160s ?Labs this morning with creatinine 1.71 which is better than 1.84 yesterday.  WBC 5.8 ? ? ?Principal Problem: ?  Acute cystitis ?Active Problems: ?  AKI (acute kidney injury) ([place]) ?  Hypotension ?  HIV (human immunodeficiency virus infection) ([place]) ?  HLD (hyperlipidemia) ?  Essential hypertension ?  ? ?Assessment and Plan: ?Multidrug-resistant UTI ?History of recurrent UTI ?-Urine cx 2/28 with ESBL proteus mirabilis and enterococcus faecalis sensitive to ampicillin ?-Currently on IV meropenem per ID  recommendation.   ?-Repeat urine culture sent on 3/13 pending report.  ID following. ?-CT scan of abdomen 3/13 with bladder wall thickening.  Last seen by urology as an outpatient on 2/28. ?-PTA, on Myrbetriq 25 mg daily ? ?Orthostatic hypotension ?History of essential hypertension ?-BP in ID office 21'Y systolic ?-Blood pressure improved with hydration. ?-Currently not on IV fluid ?-Home meds include labetalol 100 mg twice daily, losartan 12.5 mg daily, Lasix 40 mg daily as needed ?-Currently BP meds on hold. ? ?AKI (acute kidney injury)  ?-Related to dehydration/hypovolemia ?-CT abdomen without hydronephrosis ?-Creatinine gradually improving. ?Recent Labs  ?  05/09/21 ?1633 05/10/21 ?0340 05/11/21 ?0600 06/20/21 ?2102 06/21/21 ?0449 06/22/21 ?2482 06/23/21 ?5003 06/24/21 ?7048 01/05/22 ?1353 01/06/22 ?0321  ?BUN 27* 23 19 42* 44* 41* 31* 17 32* 29*  ?CREATININE 1.28* 1.17* 1.15* 1.80* 1.72* 1.73* 1.29* 0.98 1.84* 1.71*  ? ?HIV (human immunodeficiency virus infection) ([place]) ?-On triumeq ?-Follow viral load per ID request ?  ?History of TIA/stroke  ?HLD (hyperlipidemia) ?-Continue atorvastatin. ? ?COPD ?-Continue bronchodilators ? ?Dementia ?-Continue Abilify, Remeron ? ?Anxiety/depression ?-Continue Cymbalta 60 mg daily, Klonopin 1 mg twice daily as needed ? ?Constipation ?history of partial colectomy  ?-Continue scheduled Senokot, as needed MiraLAX ? ?GERD  ?-PPI ? ?Goals of care ?  Code Status: Full Code  ? ? ?Mobility: PT eval ordered. ? ?Nutritional status:  ?Body mass index is 27.36 kg/m?Marland Kitchen  ?Nutrition Problem: Increased nutrient needs ?Etiology: acute illness ?Signs/Symptoms: estimated needs ? ? ? ? ?Diet:  ?Diet Order   ? ?       ?  Diet regular Room service appropriate? Yes; Fluid consistency: Thin  Diet effective now       ?  ? ?  ?  ? ?  ? ? ?DVT prophylaxis:  ?Place and maintain sequential compression device Start: 01/06/22 1927 ?SCDs Start: 01/05/22 2055 ?  ?Antimicrobials: IV meropenem ?Fluid:  None ?Consultants: ID ?Family Communication: None at bedside ? ?Status is: Inpatient ? ?Continue in-hospital care because: Pending culture report ?Level of care: Telemetry Medical  ? ?Dispo: The patient is from: SNF ?             Anticipated d/c is to: Back to SNF ?             Patient currently is not medically stable to d/c. ?  Difficult to place patient No ? ? ? ? ?Infusions:  ? meropenem (MERREM) IV 500 mg (01/07/22 0846)  ? ? ?Scheduled Meds: ? abacavir-dolutegravir-lamiVUDine  1 tablet Oral Daily  ? ARIPiprazole  10 mg Oral Daily  ? atorvastatin  20 mg Oral Daily  ? docusate sodium  100 mg Oral BID  ? DULoxetine  60 mg Oral Daily  ? feeding supplement  237 mL Oral BID BM  ? fluticasone  2 spray Each Nare QHS  ? Magnesium  50 mg Oral Daily  ? mirabegron ER  25 mg Oral Daily  ? mirtazapine  15 mg Oral QHS  ? multivitamin with minerals  1 tablet Oral Daily  ? [START ON 01/08/2022] pantoprazole  40 mg Oral QAC breakfast  ? polyethylene glycol  17 g Oral Daily  ? polyvinyl alcohol  1 drop Both Eyes Daily  ? thiamine  100 mg Oral Daily  ? ? ?PRN meds: ?acetaminophen **OR** acetaminophen, clonazePAM, oxyCODONE  ? ?Antimicrobials: ?Anti-infectives (From admission, onward)  ? ? Start     Dose/Rate Route Frequency Ordered Stop  ? 01/07/22 1800  vancomycin (VANCOCIN) IVPB 1000 mg/200 mL premix  Status:  Discontinued       ? 1,000 mg ?200 mL/hr over 60 Minutes Intravenous Every 48 hours 01/05/22 1543 01/06/22 1258  ? 01/06/22 1000  abacavir-dolutegravir-lamiVUDine (TRIUMEQ) 600-50-300 MG per tablet 1 tablet       ? 1 tablet Oral Daily 01/06/22 0639    ? 01/05/22 1600  vancomycin (VANCOREADY) IVPB 1500 mg/300 mL       ? 1,500 mg ?150 mL/hr over 120 Minutes Intravenous  Once 01/05/22 1540 01/05/22 2058  ? 01/05/22 1600  meropenem (MERREM) 500 mg in sodium chloride 0.9 % 100 mL IVPB       ? 500 mg ?200 mL/hr over 30 Minutes Intravenous Every 12 hours 01/05/22 1540 01/08/22 0759  ? ?  ? ? ?Objective: ?Vitals:  ? 01/07/22 0836  01/07/22 1128  ?BP: (!) 189/98 (!) 148/63  ?Pulse: 78 78  ?Resp: 17 16  ?Temp: 98.2 ?F (36.8 ?C) 98.4 ?F (36.9 ?C)  ?SpO2: 98% 99%  ? ? ?Intake/Output Summary (Last 24 hours) at 01/07/2022 1136 ?Last data filed at 01/07/2022 0500 ?Gross per 24 hour  ?Intake 800.05 ml  ?Output 2000 ml  ?Net -1199.95 ml  ? ?Filed Weights  ? 01/06/22 0500 01/07/22 0510  ?Weight: 73.7 kg 72.3 kg  ? ?Weight change: -1.4 kg ?Body mass index is 27.36 kg/m?.  ? ?Physical Exam: ?General exam: Pleasant, elderly female.  Not in physical distress ?Skin: No rashes, lesions or ulcers. ?HEENT: Atraumatic, normocephalic, no obvious bleeding ?Lungs: Clear to auscultation bilaterally ?CVS: Regular rate and rhythm, no murmur ?GI/Abd soft, nontender, nondistended, bowel sound patient ?CNS: Alert, awake, oriented to place and  person ?Psychiatry: Mood appropriate ?Extremities: No pedal edema, no calf tenderness ? ?Data Review: I have personally reviewed the laboratory data and studies available. ? ?F/u labs ordered ?Unresulted Labs (From admission, onward)  ? ?  Start     Ordered  ? 01/08/22 0500  CBC with Differential/Platelet  Daily,   R     ?Question:  Specimen collection method  Answer:  Lab=Lab collect  ? 01/07/22 1136  ? 01/08/22 2122  Basic metabolic panel  Daily,   R     ?Question:  Specimen collection method  Answer:  Lab=Lab collect  ? 01/07/22 1136  ? 01/06/22 0820  HIV-1 RNA quant-no reflex-bld  Add-on,   AD       ?Question:  Specimen collection method  Answer:  IV Team=IV Team collect  ? 01/06/22 0819  ? ?  ?  ? ?  ? ? ?Signed, ?Terrilee Croak, MD ?[facility] ?01/07/2022 ? ? ? ? ? ? ? ? ? ? ? ? ?

## 2022-01-08 DIAGNOSIS — N3000 Acute cystitis without hematuria: Secondary | ICD-10-CM | POA: Diagnosis not present

## 2022-01-08 LAB — CBC WITH DIFFERENTIAL/PLATELET
Abs Immature Granulocytes: 0.01 10*3/uL (ref 0.00–0.07)
Basophils Absolute: 0 10*3/uL (ref 0.0–0.1)
Basophils Relative: 1 %
Eosinophils Absolute: 0.2 10*3/uL (ref 0.0–0.5)
Eosinophils Relative: 3 %
HCT: 40 % (ref 36.0–46.0)
Hemoglobin: 13.3 g/dL (ref 12.0–15.0)
Immature Granulocytes: 0 %
Lymphocytes Relative: 43 %
Lymphs Abs: 2.4 10*3/uL (ref 0.7–4.0)
MCH: 32.8 pg (ref 26.0–34.0)
MCHC: 33.3 g/dL (ref 30.0–36.0)
MCV: 98.5 fL (ref 80.0–100.0)
Monocytes Absolute: 0.6 10*3/uL (ref 0.1–1.0)
Monocytes Relative: 10 %
Neutro Abs: 2.4 10*3/uL (ref 1.7–7.7)
Neutrophils Relative %: 43 %
Platelets: 198 10*3/uL (ref 150–400)
RBC: 4.06 MIL/uL (ref 3.87–5.11)
RDW: 12.8 % (ref 11.5–15.5)
WBC: 5.6 10*3/uL (ref 4.0–10.5)
nRBC: 0 % (ref 0.0–0.2)

## 2022-01-08 LAB — BASIC METABOLIC PANEL
Anion gap: 12 (ref 5–15)
BUN: 32 mg/dL — ABNORMAL HIGH (ref 8–23)
CO2: 22 mmol/L (ref 22–32)
Calcium: 10.4 mg/dL — ABNORMAL HIGH (ref 8.9–10.3)
Chloride: 105 mmol/L (ref 98–111)
Creatinine, Ser: 1.43 mg/dL — ABNORMAL HIGH (ref 0.44–1.00)
GFR, Estimated: 37 mL/min — ABNORMAL LOW (ref >60)
Glucose, Bld: 122 mg/dL — ABNORMAL HIGH (ref 70–99)
Potassium: 4.8 mmol/L (ref 3.5–5.1)
Sodium: 139 mmol/L (ref 135–145)

## 2022-01-08 MED ORDER — LOSARTAN POTASSIUM 25 MG PO TABS
12.5000 mg | ORAL_TABLET | Freq: Every day | ORAL | Status: DC
  Administered 2022-01-08 – 2022-01-12 (×5): 12.5 mg via ORAL
  Filled 2022-01-08 (×5): qty 1

## 2022-01-08 MED ORDER — LORATADINE 10 MG PO TABS
10.0000 mg | ORAL_TABLET | Freq: Every day | ORAL | Status: DC
  Administered 2022-01-08 – 2022-01-12 (×5): 10 mg via ORAL
  Filled 2022-01-08 (×5): qty 1

## 2022-01-08 MED ORDER — LABETALOL HCL 100 MG PO TABS
100.0000 mg | ORAL_TABLET | Freq: Two times a day (BID) | ORAL | Status: DC
  Administered 2022-01-08 – 2022-01-12 (×7): 100 mg via ORAL
  Filled 2022-01-08 (×9): qty 1

## 2022-01-08 NOTE — Care Management Important Message (Signed)
Important Message ? ?Patient Details  ?Name: [name] ?MRN: [id number] ?Date of Birth: 02/06/1941 ? ? ?Medicare Important Message Given:  Other (see comment) ? ? ? ? ?[name]  Valynn Schamberger-Martin ?01/08/2022, 1:42 PM ?

## 2022-01-08 NOTE — Progress Notes (Signed)
This chaplain responded to the spiritual care consult for creating/updating the Pt. Advance Directive.   ? ?The Pt. declined interest at this time in an AD and welcomed leaving the document in her room for further reading.  The chaplain understands the Pt. will contact the RN for communication with spiritual care if she changes her mind. ? ?The Pt. shared with the chaplain she is experiencing some congestion today and has asked the RN for medication.  This chaplain will F/U with the RN. ? ?Chaplain Sallyanne Kuster ?[phone] ? ? ?

## 2022-01-08 NOTE — NC FL2 (Signed)
?[place] MEDICAID FL2 LEVEL OF CARE SCREENING TOOL  ?  ? ?IDENTIFICATION  ?Patient Name: ?[name] Birthdate: October 15, 1941 Sex: adult Admission Date (Current Location): ?01/05/2022  ?[place] and [place] Number: ? [place] ?  Facility and Address:  ?[facility] [place] [address] [place], [place] [address] ?     Provider Number: ?[id number]  ?Attending Physician Name and Address:  ?Terrilee Croak, MD ? Relative Name and Phone Number:  ?  ?   ?Current Level of Care: ?Hospital Recommended Level of Care: ?[place] Prior Approval Number: ?  ? ?Date Approved/Denied: ?  PASRR Number: ?[id number] ? ?Discharge Plan: ?SNF ?  ? ?Current Diagnoses: ?Patient Active Problem List  ? Diagnosis Date Noted  ? Hypotension 01/06/2022  ? Low blood pressure 01/05/2022  ? Dementia ([place]) 01/05/2022  ? Confusion 01/05/2022  ? Dizziness 01/05/2022  ? Acute cystitis 01/05/2022  ? Abdominal pain 06/21/2021  ? Hypercalcemia 06/21/2021  ? Serum total bilirubin elevated 06/21/2021  ? History of schizophrenia 06/21/2021  ? SBO (small bowel obstruction) ([place]) 06/21/2021  ? Generalized weakness 05/10/2021  ? Elevated MCV 05/10/2021  ? Moderate single current episode of major depressive disorder ([place]) 03/12/2021  ? Incontinence of urine in female 11/21/2020  ? Skin yeast infection 11/21/2020  ? Mass of left side of neck 09/12/2020  ? Colostomy in place [facility]) 09/12/2020  ? Housing problems 06/06/2020  ? Healthcare maintenance 06/06/2020  ? Abdominal wall bulge 04/22/2020  ? AKI (acute kidney injury) ([place])   ? History of colonic polyps 07/06/2019  ? History of partial colectomy 07/06/2019  ? Seasonal allergies 03/27/2019  ? Dysuria 03/27/2019  ? Obesity (BMI 30-39.9) 11/12/2018  ? Constipation due to opioid therapy 11/12/2018  ? Lactic acidosis 11/12/2018  ? Large bowel perforation ([place]) 11/12/2018  ? Peritonitis ([place]) 11/12/2018  ? Stercoral ulcer of large intestine 11/12/2018  ?  Pneumoperitoneum   ? COPD (chronic obstructive pulmonary disease) ([place]) 12/17/2017  ? Hypothyroidism 12/17/2017  ? Steroid-induced psychosis, with hallucinations ([place])   ? UTI (urinary tract infection) 03/03/2017  ? Nausea & vomiting 03/03/2017  ? Weight loss 03/03/2017  ? CAP (community acquired pneumonia) 07/22/2016  ? CKD (chronic kidney disease) stage 3, GFR 30-59 ml/min (HCC) 05/04/2016  ? Elevated LFTs 05/04/2016  ? 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 Breath shortness 06/19/2014  ? Syncope 05/31/2014  ? Rib fracture 05/31/2014  ? Weight gain 03/21/2014  ? Cervical spondylosis 08/15/2013  ? Neck pain on left side 05/24/2013  ? Unspecified vitamin D deficiency 12/14/2012  ? Vitamin D toxicity 12/14/2012  ? Hemorrhoid 09/01/2012  ? Myalgia 02/25/2012  ? Essential hypertension 11/25/2011  ? Cramps, muscle, general 08/18/2011  ? RECTAL BLEEDING 11/03/2010  ? Esophageal dysphagia 11/03/2010  ? FATIGUE 08/29/2010  ? MUSCLE PAIN 06/04/2010  ? Cystitis 05/12/2010  ? RENAL INSUFFICIENCY 12/18/2008  ? DEPRESSION 09/04/2008  ? HLD (hyperlipidemia) 05/22/2008  ? IRRITABLE BOWEL SYNDROME 05/22/2008  ? HERNIATED CERVICAL DISC 10/05/2007  ? HERNIATED LUMBAR DISC 10/05/2007  ? History of urinary tract infection 09/20/2007  ? HIV-1 associated autonomic neuropathy ([place]) 03/16/2007  ? ELBOW PAIN 03/16/2007  ? ISCHEMIC COLITIS 12/27/2006  ? Acute kidney injury superimposed on CKD ([place]) 12/27/2006  ? HIV (human immunodeficiency virus infection) ([place]) 08/27/2006  ? ? ?Orientation RESPIRATION BLADDER Height & Weight   ?  ?Self, Time, Place ? Normal Continent, External catheter Weight: 163 lb 9.3 oz (74.2 kg) ?Height:  '5\' 4"'$  (162.6 cm)  ?BEHAVIORAL  SYMPTOMS/MOOD NEUROLOGICAL BOWEL NUTRITION STATUS  ?    Continent Diet (See C summary)  ?AMBULATORY STATUS COMMUNICATION OF NEEDS Skin   ?Extensive Assist Verbally Normal ?  ?  ?  ?    ?     ?     ? ? ?Personal Care Assistance Level of Assistance  ?Bathing, Feeding, Dressing Bathing  Assistance: Maximum assistance ?Feeding assistance: Limited assistance ?Dressing Assistance: Maximum assistance ?   ? ?Functional Limitations Info  ?Sight, Hearing, Speech Sight Info: Adequate ?Hearing Info: Adequate ?Speech Info: Adequate  ? ? ?SPECIAL CARE FACTORS FREQUENCY  ?OT (By licensed OT), PT (By licensed PT)   ?  ?PT Frequency: 5x a week ?OT Frequency: 5x a week ?  ?  ?  ?   ? ? ?Contractures Contractures Info: Not present  ? ? ?Additional Factors Info  ?Code Status, Allergies Code Status Info: Full ?Allergies Info: Bee Venom   Promethazine   Tenofovir Disoproxil Fumarate (Tenofovir Disoproxil Fumarate)   Compazine  (Prochlorperazine Edisylate)   Haloperidol Lactate   Sulfa Antibiotics   Brompheniramine-acetaminophen   Chlorcyclizine   Chlorpromazine   Codeine   Navane (Thiothixene)   Prednisone   Promethazine Hcl   Propoxyphene N-acetaminophen   Morphine ?  ?  ?  ?   ? ?Current Medications (01/08/2022):  This is the current hospital active medication list ?Current Facility-Administered Medications  ?Medication Dose Route Frequency Provider Last Rate Last Admin  ? abacavir-dolutegravir-lamiVUDine (TRIUMEQ) 600-50-300 MG per tablet 1 tablet  1 tablet Oral Daily Howerter, Justin B, DO   1 tablet at 01/07/22 1238  ? acetaminophen (TYLENOL) tablet 650 mg  650 mg Oral Q6H PRN Howerter, Justin B, DO      ? Or  ? acetaminophen (TYLENOL) suppository 650 mg  650 mg Rectal Q6H PRN Howerter, Justin B, DO      ? ARIPiprazole (ABILIFY) tablet 10 mg  10 mg Oral Daily Howerter, Justin B, DO   10 mg at 01/07/22 0840  ? atorvastatin (LIPITOR) tablet 20 mg  20 mg Oral Daily Howerter, Justin B, DO   20 mg at 01/07/22 0839  ? clonazePAM (KLONOPIN) tablet 1 mg  1 mg Oral BID PRN Howerter, Justin B, DO   1 mg at 01/07/22 2121  ? docusate sodium (COLACE) capsule 100 mg  100 mg Oral BID Terrilee Croak, MD   100 mg at 01/07/22 2114  ? DULoxetine (CYMBALTA) DR capsule 60 mg  60 mg Oral Daily Dahal, Marlowe Aschoff, MD   60 mg at 01/07/22 1238   ? feeding supplement (ENSURE ENLIVE / ENSURE PLUS) liquid 237 mL  237 mL Oral BID BM Howerter, Justin B, DO   237 mL at 01/07/22 1600  ? fluticasone (FLONASE) 50 MCG/ACT nasal spray 2 spray  2 spray Each Nare QHS Howerter, Justin B, DO   2 spray at 01/07/22 2116  ? labetalol (NORMODYNE) tablet 100 mg  100 mg Oral BID Terrilee Croak, MD   100 mg at 01/08/22 1105  ? losartan (COZAAR) tablet 12.5 mg  12.5 mg Oral Daily Dahal, Binaya, MD   12.5 mg at 01/08/22 1105  ? magnesium oxide (MAG-OX) tablet 200 mg  200 mg Oral Daily Dahal, Marlowe Aschoff, MD   200 mg at 01/07/22 1238  ? mirabegron ER (MYRBETRIQ) tablet 25 mg  25 mg Oral Daily Dahal, Binaya, MD   25 mg at 01/07/22 1239  ? mirtazapine (REMERON) tablet 15 mg  15 mg Oral QHS Howerter, Justin B, DO   15  mg at 01/07/22 2114  ? multivitamin with minerals tablet 1 tablet  1 tablet Oral Daily Elodia Florence., MD   1 tablet at 01/07/22 4469  ? oxyCODONE (Oxy IR/ROXICODONE) immediate release tablet 5 mg  5 mg Oral BID PRN Dahal, Marlowe Aschoff, MD      ? pantoprazole (PROTONIX) EC tablet 40 mg  40 mg Oral QAC breakfast Dahal, Marlowe Aschoff, MD   40 mg at 01/08/22 0742  ? polyethylene glycol (MIRALAX / GLYCOLAX) packet 17 g  17 g Oral Daily Elodia Florence., MD   17 g at 01/07/22 [phone]  ? polyvinyl alcohol (LIQUIFILM TEARS) 1.4 % ophthalmic solution 1 drop  1 drop Both Eyes Daily Howerter, Justin B, DO   1 drop at 01/07/22 1239  ? thiamine (VITAMIN B-1) tablet 100 mg  100 mg Oral Daily Dahal, Marlowe Aschoff, MD      ? ? ? ?Discharge Medications: ?Please see discharge summary for a list of discharge medications. ? ?Relevant Imaging Results: ? ?Relevant Lab Results: ? ? ?Additional Information ?Pt SSN: [id number] ? ?Emeterio Reeve, LCSW ? ? ? ? ?

## 2022-01-08 NOTE — TOC Progression Note (Signed)
Transition of Care (TOC) - Progression Note  ? ? ?Patient Details  ?Name: [name] ?MRN: [id number] ?Date of Birth: December 10, 1940 ? ?Transition of Care (TOC) CM/SW Contact  ?Emeterio Reeve, LCSW ?Phone Number: ?01/08/2022, 12:25 PM ? ?Clinical Narrative:    ? ?CSW spoke [name] about LTC admission. They are able to accept pt at DC. They requested for CSW to start insurance authorization for SNF. They will be able to take pt with or without insurance authorization. [place] will start [employer] auth process. CSW updated pts POA [name]. CSW started insurance auth and waiting to response.  ? ?Expected Discharge Plan: [place] ?Barriers to Discharge: Continued Medical Work up ? ?Expected Discharge Plan and Services ?Expected Discharge Plan: [place] ?  ?  ?  ?Living arrangements for the past 2 months: [place] ?                ?  ?  ?  ?  ?  ?  ?  ?  ?  ?  ? ? ?Social Determinants of Health (SDOH) Interventions ?  ? ?Readmission Risk Interventions ?Readmission Risk Prevention Plan 06/23/2021 05/11/2021  ?Transportation Screening Complete Complete  ?PCP or Specialist Appt within 3-5 Days - Complete  ?[place] or Home Care Consult Complete Complete  ?Social Work Consult for [place] Planning/Counseling Complete Complete  ?Palliative Care Screening Not Applicable Not Applicable  ?Medication Review [employer]) Complete Complete  ?Some recent data might be hidden  ? ?Emeterio Reeve, LCSW ?Clinical Social Worker ? ?

## 2022-01-08 NOTE — Progress Notes (Signed)
3/16 Im Letter mailed to patient's address due to patient's isolation status. ?

## 2022-01-08 NOTE — Progress Notes (Signed)
?PROGRESS NOTE ? ?[name]  ?DOB: 01-Aug-1941  ?PCP: Wannetta Sender, FNP ?MPN:[id number]  ?DOA: 01/05/2022 ? LOS: 3 days  ?Hospital Day: 4 ? ?Brief narrative: ?[name] is a 81 y.o. adult with PMH significant for dementia, HIV, HTN, HLD, CKD, TIA/stroke, peripheral neuropathy, orthostatic hypotension, COPD, anxiety/depression, history of partial colectomy who is a nursing home resident. ? ?She has recurrent UTI, recently evaluated by urology on 12/23/2021.  Urine culture performed at that time grew out Proteus and Enterococcus faecalis with multiple resistances to oral antibiotics.  She was tried on Bactrim oral but she continued to have increasing confusion, dysuria. ?Patient was seen by ID in the office on 3/13.  She also mentioned dizziness on standing up.  She was noted to be hypotensive and was sent to the ED for IV antibiotics and hydration. ? ?In the ED patient was afebrile ?Urinalysis showed cloudy amber color urine with moderate amount of leukocytes, positive nitrite and many bacteria. ?Repeat urine culture was sent. ?CT abdomen pelvis showed slight urinary bladder wall thickening and perivesicular fat stranding ?Admitted to hospitalist service ? ?Subjective: ?Patient was seen and examined this morning.  Comfortable in bed.  Not in distress.  No new symptoms. ?Pending urine culture report. ? ?Principal Problem: ?  Acute cystitis ?Active Problems: ?  AKI (acute kidney injury) ([place]) ?  Hypotension ?  HIV (human immunodeficiency virus infection) ([place]) ?  HLD (hyperlipidemia) ?  Essential hypertension ?  ? ?Assessment and Plan: ?Multidrug-resistant UTI ?History of recurrent UTI ?-Urine cx 2/28 with ESBL proteus mirabilis and enterococcus faecalis sensitive to ampicillin ?-Currently on IV meropenem per ID recommendation.   ?-Repeat urine culture sent on 3/13 is growing Proteus mirabilis, pending sensitivity.  ID following. ?-CT scan of abdomen 3/13 with bladder wall thickening.  Last seen by  urology as an outpatient on 2/28. ?-PTA, on Myrbetriq 25 mg daily ? ?Orthostatic hypotension ?History of essential hypertension ?-BP in ID office 00'Q systolic ?-Blood pressure improved with hydration. ?-Home meds include labetalol 100 mg twice daily, losartan 12.5 mg daily, Lasix 40 mg daily as needed ?-Blood pressure meds were initially held because of hypotension.  Blood pressure trending up now.  -Resumed labetalol and losartan today. ? ?AKI on CKD 3b ?-Baseline creatinine less than 1.5.  Presented with a creatinine elevated 1.84.  Related to dehydration/hypovolemia ?-CT abdomen without hydronephrosis ?-Creatinine gradually improved with hydration. ?Recent Labs  ?  05/10/21 ?0340 05/11/21 ?0600 06/20/21 ?2102 06/21/21 ?0449 06/22/21 ?6761 06/23/21 ?9509 06/24/21 ?3267 01/05/22 ?1353 01/06/22 ?0321 01/08/22 ?0105  ?BUN 23 19 42* 44* 41* 31* 17 32* 29* 32*  ?CREATININE 1.17* 1.15* 1.80* 1.72* 1.73* 1.29* 0.98 1.84* 1.71* 1.43*  ? ? ?HIV (human immunodeficiency virus infection) ([place]) ?-On triumeq ?-Follow viral load per ID request ?  ?History of TIA/stroke  ?HLD (hyperlipidemia) ?-Continue atorvastatin. ? ?COPD ?-Continue bronchodilators ? ?Dementia ?-Continue Abilify, Remeron ? ?Anxiety/depression ?-Continue Cymbalta 60 mg daily, Klonopin 1 mg twice daily as needed ? ?Constipation ?history of partial colectomy  ?-Continue scheduled Senokot, as needed MiraLAX ? ?GERD  ?-PPI ? ?Goals of care ?  Code Status: Full Code  ? ? ?Mobility: PT eval ordered. ? ?Nutritional status:  ?Body mass index is 28.08 kg/m?Marland Kitchen  ?Nutrition Problem: Increased nutrient needs ?Etiology: acute illness ?Signs/Symptoms: estimated needs ? ? ? ? ?Diet:  ?Diet Order   ? ?       ?  Diet regular Room service appropriate? Yes; Fluid consistency: Thin  Diet effective  now       ?  ? ?  ?  ? ?  ? ? ?DVT prophylaxis:  ?Place and maintain sequential compression device Start: 01/06/22 1927 ?SCDs Start: 01/05/22 2055 ?  ?Antimicrobials: IV meropenem ?Fluid:  None ?Consultants: ID ?Family Communication: None at bedside ? ?Status is: Inpatient ? ?Continue in-hospital care because: Pending urine culture report ?Level of care: Telemetry Medical  ? ?Dispo: The patient is from: SNF ?             Anticipated d/c is to: Back to SNF ?             Patient currently is not medically stable to d/c. ?  Difficult to place patient No ? ? ? ? ?Infusions:  ? ? ? ?Scheduled Meds: ? abacavir-dolutegravir-lamiVUDine  1 tablet Oral Daily  ? ARIPiprazole  10 mg Oral Daily  ? atorvastatin  20 mg Oral Daily  ? docusate sodium  100 mg Oral BID  ? DULoxetine  60 mg Oral Daily  ? feeding supplement  237 mL Oral BID BM  ? fluticasone  2 spray Each Nare QHS  ? magnesium oxide  200 mg Oral Daily  ? mirabegron ER  25 mg Oral Daily  ? mirtazapine  15 mg Oral QHS  ? multivitamin with minerals  1 tablet Oral Daily  ? pantoprazole  40 mg Oral QAC breakfast  ? polyethylene glycol  17 g Oral Daily  ? polyvinyl alcohol  1 drop Both Eyes Daily  ? thiamine  100 mg Oral Daily  ? ? ?PRN meds: ?acetaminophen **OR** acetaminophen, clonazePAM, oxyCODONE  ? ?Antimicrobials: ?Anti-infectives (From admission, onward)  ? ? Start     Dose/Rate Route Frequency Ordered Stop  ? 01/07/22 1800  vancomycin (VANCOCIN) IVPB 1000 mg/200 mL premix  Status:  Discontinued       ? 1,000 mg ?200 mL/hr over 60 Minutes Intravenous Every 48 hours 01/05/22 1543 01/06/22 1258  ? 01/06/22 1000  abacavir-dolutegravir-lamiVUDine (TRIUMEQ) 600-50-300 MG per tablet 1 tablet       ? 1 tablet Oral Daily 01/06/22 0639    ? 01/05/22 1600  vancomycin (VANCOREADY) IVPB 1500 mg/300 mL       ? 1,500 mg ?150 mL/hr over 120 Minutes Intravenous  Once 01/05/22 1540 01/05/22 2058  ? 01/05/22 1600  meropenem (MERREM) 500 mg in sodium chloride 0.9 % 100 mL IVPB       ? 500 mg ?200 mL/hr over 30 Minutes Intravenous Every 12 hours 01/05/22 1540 01/07/22 2144  ? ?  ? ? ?Objective: ?Vitals:  ? 01/08/22 0218 01/08/22 0641  ?BP: (!) 151/61 (!) 150/74  ?Pulse: 73  72  ?Resp: 16 16  ?Temp: 98 ?F (36.7 ?C) 98.1 ?F (36.7 ?C)  ?SpO2: 93% 100%  ? ? ?Intake/Output Summary (Last 24 hours) at 01/08/2022 0749 ?Last data filed at 01/08/2022 0500 ?Gross per 24 hour  ?Intake 1027 ml  ?Output 1800 ml  ?Net -773 ml  ? ? ?Filed Weights  ? 01/06/22 0500 01/07/22 0510 01/08/22 0500  ?Weight: 73.7 kg 72.3 kg 74.2 kg  ? ?Weight change: 1.9 kg ?Body mass index is 28.08 kg/m?.  ? ?Physical Exam: ?General exam: Pleasant, elderly female.  Not in physical distress ?Skin: No rashes, lesions or ulcers. ?HEENT: Atraumatic, normocephalic, no obvious bleeding ?Lungs: Clear to auscultation bilaterally ?CVS: Regular rate and rhythm, no murmur ?GI/Abd soft, nontender, nondistended, bowel sound patient ?CNS: Alert, awake, oriented to place and person ?Psychiatry: Mood appropriate ?Extremities: No  pedal edema, no calf tenderness ? ?Data Review: I have personally reviewed the laboratory data and studies available. ? ?F/u labs ordered ?Unresulted Labs (From admission, onward)  ? ?  Start     Ordered  ? 01/08/22 0500  CBC with Differential/Platelet  Daily,   R     ?Question:  Specimen collection method  Answer:  Lab=Lab collect  ? 01/07/22 1136  ? 01/08/22 2178  Basic metabolic panel  Daily,   R     ?Question:  Specimen collection method  Answer:  Lab=Lab collect  ? 01/07/22 1136  ? ?  ?  ? ?  ? ? ?Signed, ?Terrilee Croak, MD ?[facility] ?01/08/2022 ? ? ? ? ? ? ? ? ? ? ? ? ?

## 2022-01-08 NOTE — Progress Notes (Signed)
Mobility Specialist Progress Note: ? ? 01/08/22 0954  ?Mobility  ?Activity Ambulated with assistance in room;Transferred from bed to chair  ?Level of Assistance Moderate assist, patient does 50-74%  ?Assistive Device Front wheel walker  ?Distance Ambulated (ft) 4 ft  ?Activity Response Tolerated well  ?$Mobility charge 1 Mobility  ? ?Pt received in bed willing to participate in mobility. Complaints of a headache. MinA to get pt to EOB, ModA to stand then MinA to step to recliner. Left in chair with call bell in reach and all needs met.  ? ?[name] ?Mobility Specialist ?Primary Phone [phone] ? ?

## 2022-01-08 NOTE — Plan of Care (Signed)
  Problem: Nutrition: Goal: Adequate nutrition will be maintained Outcome: Progressing   Problem: Pain Managment: Goal: General experience of comfort will improve Outcome: Progressing   Problem: Safety: Goal: Ability to remain free from injury will improve Outcome: Progressing   

## 2022-01-09 LAB — URINE CULTURE: Culture: >=100000 — AB

## 2022-01-09 LAB — CBC WITH DIFFERENTIAL/PLATELET
Abs Immature Granulocytes: 0.01 10*3/uL (ref 0.00–0.07)
Basophils Absolute: 0 10*3/uL (ref 0.0–0.1)
Basophils Relative: 0 %
Eosinophils Absolute: 0.2 10*3/uL (ref 0.0–0.5)
Eosinophils Relative: 3 %
HCT: 41.3 % (ref 36.0–46.0)
Hemoglobin: 13.9 g/dL (ref 12.0–15.0)
Immature Granulocytes: 0 %
Lymphocytes Relative: 48 %
Lymphs Abs: 3.4 10*3/uL (ref 0.7–4.0)
MCH: 33.3 pg (ref 26.0–34.0)
MCHC: 33.7 g/dL (ref 30.0–36.0)
MCV: 99 fL (ref 80.0–100.0)
Monocytes Absolute: 0.8 10*3/uL (ref 0.1–1.0)
Monocytes Relative: 11 %
Neutro Abs: 2.7 10*3/uL (ref 1.7–7.7)
Neutrophils Relative %: 38 %
Platelets: 202 10*3/uL (ref 150–400)
RBC: 4.17 MIL/uL (ref 3.87–5.11)
RDW: 12.9 % (ref 11.5–15.5)
WBC: 7.1 10*3/uL (ref 4.0–10.5)
nRBC: 0 % (ref 0.0–0.2)

## 2022-01-09 LAB — BASIC METABOLIC PANEL
Anion gap: 8 (ref 5–15)
BUN: 40 mg/dL — ABNORMAL HIGH (ref 8–23)
CO2: 24 mmol/L (ref 22–32)
Calcium: 10.3 mg/dL (ref 8.9–10.3)
Chloride: 104 mmol/L (ref 98–111)
Creatinine, Ser: 1.77 mg/dL — ABNORMAL HIGH (ref 0.44–1.00)
GFR, Estimated: 29 mL/min — ABNORMAL LOW (ref >60)
Glucose, Bld: 115 mg/dL — ABNORMAL HIGH (ref 70–99)
Potassium: 4.9 mmol/L (ref 3.5–5.1)
Sodium: 136 mmol/L (ref 135–145)

## 2022-01-09 MED ORDER — MIRTAZAPINE 15 MG PO TABS
15.0000 mg | ORAL_TABLET | Freq: Every day | ORAL | 0 refills | Status: DC
Start: 2022-01-09 — End: 2022-01-12

## 2022-01-09 MED ORDER — ENSURE ENLIVE PO LIQD: 237.0000 mL | Freq: Two times a day (BID) | ORAL | 12 refills | Status: DC

## 2022-01-09 MED ORDER — SODIUM CHLORIDE 0.9 % IV SOLN: INTRAVENOUS | Status: DC

## 2022-01-09 MED ORDER — OXYCODONE HCL 5 MG PO TABS
5.0000 mg | ORAL_TABLET | Freq: Two times a day (BID) | ORAL | 0 refills | Status: AC | PRN
Start: 2022-01-09 — End: 2022-01-12

## 2022-01-09 MED ORDER — CLONAZEPAM 1 MG PO TABS: 1.0000 mg | ORAL_TABLET | Freq: Two times a day (BID) | ORAL | 0 refills | Status: DC | PRN

## 2022-01-09 NOTE — Progress Notes (Signed)
Mobility Specialist Progress Note: ? ? 01/09/22 1001  ?Mobility  ?Activity  ?(Arm and leg exercises in bed)  ?Assistive Device None  ?Activity Response Tolerated well  ?$Mobility charge 1 Mobility  ? ?Pt received in bed and with max encouragement pt did not wan to get OOB. Pt agreed to participate in bed level exercises. Able to complete 3 sets of leg exercises and 2 sets of arm exercises. Left in bed with call bell in reach, all needs met and RN present.  ? ?[name] ?Mobility Specialist ?Primary Phone [phone] ? ?

## 2022-01-09 NOTE — TOC Transition Note (Signed)
Transition of Care (TOC) - CM/SW Discharge Note ? ? ?Patient Details  ?Name: [name] ?MRN: [id number] ?Date of Birth: 17-Jun-1941 ? ?Transition of Care (TOC) CM/SW Contact:  ?Emeterio Reeve, LCSW ?Phone Number: ?01/09/2022, 1:02 PM ? ? ?Clinical Narrative:    ? ?Per MD patient ready for DC to [facility]. RN, patient, patient's family, and facility notified of DC. Discharge Summary and FL2 sent to facility. DC packet on chart. Ambulance transport requested for patient.  ?  ?RN to call report to [phone]. ? ?CSW will sign off for now as social work intervention is no longer needed. Please consult [place] again if new needs arise. ? ? ?Final next level of care: [place] ?Barriers to Discharge: Barriers Resolved ? ? ?Patient Goals and CMS Choice ?  ?CMS Medicare.gov Compare Post Acute Care list provided to:: Patient ?Choice offered to / list presented to : [facility] POA / Guardian ? ?Discharge Placement ?  ?           ?Patient chooses bed at: Other - please specify in the comment section below: [facility]) ?Patient to be transferred to facility by: ptar ?Name of family member notified: POA [name] ?Patient and family notified of of transfer: 01/09/22 ? ?Discharge Plan and Services ?  ?  ?           ?  ?  ?  ?  ?  ?  ?  ?  ?  ?  ? ?Social Determinants of Health (SDOH) Interventions ?  ? ? ?Readmission Risk Interventions ?Readmission Risk Prevention Plan 06/23/2021 05/11/2021  ?Transportation Screening Complete Complete  ?PCP or Specialist Appt within 3-5 Days - Complete  ?[facility] or Home Care Consult Complete Complete  ?Social Work Consult for [place] Planning/Counseling Complete Complete  ?Palliative Care Screening Not Applicable Not Applicable  ?Medication Review Press photographer) Complete Complete  ?Some recent data might be hidden  ? ? ?Emeterio Reeve, LCSW ?Clinical Social Worker ? ? ? ?

## 2022-01-09 NOTE — Progress Notes (Signed)
Nutrition Follow-up ? ?DOCUMENTATION CODES:  ?Not applicable ? ?INTERVENTION:  ?Continue current diet as ordered ?Continue Ensure Enlive po BID, each supplement provides 350 kcal and 20 grams of protein. ?Continue MVI with minerals daily ? ?NUTRITION DIAGNOSIS:  ?Increased nutrient needs related to acute illness as evidenced by estimated needs. ?- remains applicable ? ?GOAL:  ?Patient will meet greater than or equal to 90% of their needs ?- making progress, supplements in place ? ?MONITOR:  ?PO intake, Supplement acceptance, Labs, Weight trends ? ?REASON FOR ASSESSMENT:  ?Malnutrition Screening Tool ?  ? ?ASSESSMENT:  ?81 y.o. pt with hx of HIV, HTN, HLD, CKD3, COPD, and dementia, presented to ED from outpatient ID appointment due to hypotension and concern for UTI.  ? ?Pt resting in bed at the time of assessment. States that she has a headache this AM. ? ?Inquired about appetite since last assessment. Reports that "sometimes she has an appetite and sometimes she doesn't" but that this is normal for her at home. Intake trends appear to be excellent. Pt also reports she likes the ensure, prefers the strawberry flavor. ? ?Noted pt will be dc to LTC at admission. Insurance authorization being obtained. ? ?Average Meal Intake: ?3/13-3/17: 98% intake x 6 recorded meals ? ?Nutritionally Relevant Medications: ?Scheduled Meds: ? atorvastatin  20 mg Oral Daily  ? docusate sodium  100 mg Oral BID  ? Ensure Enlive  237 mL Oral BID BM  ? magnesium oxide  200 mg Oral Daily  ? mirtazapine  15 mg Oral QHS  ? multivitamin with minerals  1 tablet Oral Daily  ? pantoprazole  40 mg Oral QAC breakfast  ? polyethylene glycol  17 g Oral Daily  ? thiamine  100 mg Oral Daily  ? ?Labs Reviewed: ?BUN 40, creatinine 1.77 ? ?NUTRITION - FOCUSED PHYSICAL EXAM: ?Flowsheet Row Most Recent Value  ?Orbital Region No depletion  ?Upper Arm Region No depletion  ?Thoracic and Lumbar Region No depletion  ?Buccal Region No depletion  ?Temple Region Mild  depletion  ?Clavicle Bone Region Mild depletion  ?Clavicle and Acromion Bone Region No depletion  ?Scapular Bone Region No depletion  ?Dorsal Hand Mild depletion  ?Patellar Region Mild depletion  ?Anterior Thigh Region Mild depletion  ?Posterior Calf Region Moderate depletion  ?Edema (RD Assessment) None  ?Hair Reviewed  ?Eyes Reviewed  ?Mouth Reviewed  ?Skin Reviewed  ?Nails Reviewed  ? ?Diet Order:   ?Diet Order   ? ?       ?  Diet regular Room service appropriate? Yes; Fluid consistency: Thin  Diet effective now       ?  ? ?  ?  ? ?  ? ? ?EDUCATION NEEDS:  ?Education needs have been addressed ? ?Skin:  Skin Assessment: Reviewed RN Assessment ? ?Last BM:  3/12 ? ?Height:  ?Ht Readings from Last 1 Encounters:  ?01/06/22 '5\' 4"'$  (1.626 m)  ? ?Weight:  ?Wt Readings from Last 1 Encounters:  ?01/09/22 74.2 kg  ? ? ?Ideal Body Weight:  54.5 kg ? ?BMI:  Body mass index is 28.08 kg/m?. ? ?Estimated Nutritional Needs:  ?Kcal:  1600-1800 kcal/d ?Protein:  80-90 g/d ?Fluid:  1.8-2 L/d ? ? ?Ranell Patrick, RD, LDN ?Clinical Dietitian ?RD pager # available in [place]  ?After hours/weekend pager # available in [place] ?

## 2022-01-09 NOTE — Progress Notes (Signed)
Mobility Specialist Progress Note: ? ? 01/09/22 1512  ?Mobility  ?Activity Ambulated with assistance in room;Transferred from chair to bed  ?Level of Assistance Minimal assist, patient does 75% or more  ?Assistive Device Front wheel walker  ?Distance Ambulated (ft) 10 ft  ?Activity Response Tolerated well  ?$Mobility charge 1 Mobility  ? ?Pt received in chair asking to go back to bed. Encouraged more ambulation bu pt declined. MinA to stand. Pt left in bed with call bell in reach and all needs met.  ? ?  ?Mobility Specialist ?Primary Phone [phone] ? ?

## 2022-01-09 NOTE — Discharge Summary (Addendum)
? ?Physician Discharge Summary  ?[name] DXI:[id number] DOB: [birth year] DOA: 01/05/2022 ? ?PCP: Wannetta Sender, FNP ? ?Admit date: 01/05/2022 ?Discharge date: 01/12/2022 ? ?Admitted From: Long-term nursing home ?Discharge disposition: New long-term nursing home ? ?Brief narrative: ?[name] is a 81 y.o. adult with PMH significant for dementia, HIV, HTN, HLD, CKD, TIA/stroke, peripheral neuropathy, orthostatic hypotension, COPD, anxiety/depression, history of partial colectomy who is a nursing home resident. ? ?She has recurrent UTI, recently evaluated by urology on 12/23/2021.  Urine culture performed at that time grew out Proteus and Enterococcus faecalis with multiple resistances to oral antibiotics.  She was tried on Bactrim oral but she continued to have increasing confusion, dysuria. ?Patient was seen by ID in the office on 3/13.  She also mentioned dizziness on standing up.  She was noted to be hypotensive and was sent to the ED for IV antibiotics and hydration. ? ?In the ED patient was afebrile ?Urinalysis showed cloudy amber color urine with moderate amount of leukocytes, positive nitrite and many bacteria. ?Repeat urine culture was sent. ?CT abdomen pelvis showed slight urinary bladder wall thickening and perivesicular fat stranding ?Admitted to hospitalist service ? ?Subjective: ?Patient was seen and examined this morning.   ?Comfortable in bed.  Not in distress.  No new symptoms. ? ?Principal Problem: ?  Acute cystitis ?Active Problems: ?  AKI (acute kidney injury) ([place]) ?  Hypotension ?  HIV (human immunodeficiency virus infection) ([place]) ?  HLD (hyperlipidemia) ?  Essential hypertension ?  ? ?Hospital course: ?Multidrug-resistant UTI ?History of recurrent UTI ?-Urine cx 2/28 with ESBL proteus mirabilis and enterococcus faecalis sensitive to ampicillin ?-Repeat urine culture sent on 3/13 is also growing Proteus mirabilis again pending sensitivity at this time.  Per ID recommendation,  patient was given 3 days of IV meropenem.  No need of antibiotics at discharge. ?-CT scan of abdomen 3/13 with bladder wall thickening.  Last seen by urology as an outpatient on 2/28. ?-PTA, on Myrbetriq 25 mg daily ? ?Orthostatic hypotension ?History of essential hypertension ?-BP in ID office 76'B systolic ?-Blood pressure improved with hydration. ?-Home meds include labetalol 100 mg twice daily, losartan 12.5 mg daily, Lasix 40 mg daily as needed ?-Blood pressure meds were initially held because of hypotension.  Blood pressure trending up now.  Home meds resumed. ? ?AKI on CKD 3b ?-Baseline creatinine less than 1.5.  Presented with a creatinine elevated 1.84.  Related to dehydration/hypovolemia ?-CT abdomen without hydronephrosis ?-Creatinine gradually improved with hydration. ?Recent Labs  ?  05/11/21 ?0600 06/20/21 ?2102 06/21/21 ?0449 06/22/21 ?0653 06/23/21 ?3419 06/24/21 ?3790 01/05/22 ?1353 01/06/22 ?0321 01/08/22 ?0105 01/09/22 ?2409  ?BUN 19 42* 44* 41* 31* 17 32* 29* 32* 40*  ?CREATININE 1.15* 1.80* 1.72* 1.73* 1.29* 0.98 1.84* 1.71* 1.43* 1.77*  ? ?HIV (human immunodeficiency virus infection) ([place]) ?-On triumeq ?-Follow viral load per ID request ?  ?History of TIA/stroke  ?HLD (hyperlipidemia) ?-Continue atorvastatin. ? ?COPD ?-Continue bronchodilators ? ?Dementia ?-Continue Abilify, Remeron ? ?Anxiety/depression ?-Continue Cymbalta 60 mg daily, Klonopin 1 mg twice daily as needed ? ?Constipation ?history of partial colectomy  ?-Continue scheduled Senokot, as needed MiraLAX ? ?GERD  ?-PPI ? ?Goals of care ?  Code Status: Full Code  ? ? ?Mobility: PT eval ordered. ? ?Nutritional status:  ?Body mass index is 28.08 kg/m?Marland Kitchen  ?Nutrition Problem: Increased nutrient needs ?Etiology: acute illness ?Signs/Symptoms: estimated needs ? ? ?Wounds:  ?- ?  ? ?Discharge Exam:  ? ?Vitals:  ? 01/09/22  1537 01/09/22 0848 01/09/22 0849 01/09/22 1007  ?BP:   (!) 134/58 (!) 122/56  ?Pulse:   68 77  ?Resp:   18 20  ?Temp:   98.4 ?F (36.9 ?C)    ?TempSrc:  Oral    ?SpO2:   95% 94%  ?Weight: 74.2 kg     ?Height:      ? ? ?Body mass index is 28.08 kg/m?.  ? ?General exam: Pleasant, elderly female.  Not in physical distress ?Skin: No rashes, lesions or ulcers. ?HEENT: Atraumatic, normocephalic, no obvious bleeding ?Lungs: Clear to auscultation bilaterally ?CVS: Regular rate and rhythm, no murmur ?GI/Abd soft, nontender, nondistended, bowel sound patient ?CNS: Alert, awake, oriented to place and person ?Psychiatry: Mood appropriate ?Extremities: No pedal edema, no calf tenderness ? ?Follow ups:  ? ? Follow-up Information   ? ? Wannetta Sender, FNP Follow up.   ?Specialty: Family Medicine ?Contact information: ?[place] ?[address] [place] [address] ?[place] [address] ?[phone] ? ? ?  ?  ? ? Arnoldo Lenis, MD .   ?Specialty: Cardiology ?Contact information: ?[address] ?[address] ?[place] [address] ?[phone] ? ? ?  ?  ? ? Wannetta Sender, FNP Follow up.   ?Specialty: Family Medicine ?Contact information: ?[place] ?[address] [place] [address] ?[place] [address] ?[phone] ? ? ?  ?  ? ? Arnoldo Lenis, MD .   ?Specialty: Cardiology ?Contact information: ?[address] ?[address] ?[place] [address] ?[phone] ? ? ?  ?  ? ?  ?  ? ?  ? ? ?Discharge Instructions:  ? ?Discharge Instructions   ? ? Call MD for:  difficulty breathing, headache or visual disturbances   Complete by: As directed ?  ? Call MD for:  extreme fatigue   Complete by: As directed ?  ? Call MD for:  hives   Complete by: As directed ?  ? Call MD for:  persistant dizziness or light-headedness   Complete by: As directed ?  ? Call MD for:  persistant nausea and vomiting   Complete by: As directed ?  ? Call MD for:  severe uncontrolled pain   Complete by: As directed ?  ? Call MD for:  temperature >100.4   Complete by: As directed ?  ? Diet general   Complete by: As directed ?  ?  Discharge instructions   Complete by: As directed ?  ? General discharge instructions: ?Follow with Primary MD Wannetta Sender, FNP in 7 days  ?Please request your PCP  to go over your hospital tests, procedures, radiology results at the follow up. Please get your medicines reviewed and adjusted.  Your PCP may decide to repeat certain labs or tests as needed. ?Do not drive, operate heavy machinery, perform activities at heights, swimming or participation in water activities or provide baby sitting services if your were admitted for syncope or siezures until you have seen by Primary MD or a Neurologist and advised to do so again. ?[place] Controlled Substance Reporting System database was reviewed. Do not drive, operate heavy machinery, perform activities at heights, swim, participate in water activities or provide baby-sitting services while on medications for pain, sleep and mood until your outpatient physician has reevaluated you and advised to do so again.  You are strongly recommended to comply with the dose, frequency and duration of prescribed medications. ?Activity: As tolerated with  Full fall precautions use walker/cane & assistance as needed ?Avoid using any recreational substances like cigarette, tobacco, alcohol, or non-prescribed drug. ?If you experience worsening of your admission symptoms, develop shortness of breath, life threatening emergency, suicidal or homicidal thoughts you must seek medical attention immediately by calling 911 or calling your MD immediately  if symptoms less severe. ?You must read complete instructions/literature along with all the possible adverse reactions/side effects for all the medicines you take and that have been prescribed to you. Take any new medicine only after you have completely understood and accepted all the possible adverse reactions/side effects.  ?Wear Seat belts while driving. ?You were cared for by a hospitalist during your hospital stay. If  you have any questions about your discharge medications or the care you received while you were in the hospital after you are discharged, you can call the unit and ask to speak with the hospitalist or the

## 2022-01-09 NOTE — Progress Notes (Signed)
Physical Therapy Treatment ?Patient Details ?Name: [name] ?MRN: [id number] ?DOB: 04-May-1941 ?Today's Date: 01/09/2022 ? ? ?History of Present Illness [name] is an 81 y.o. female admitted from [facility] on 01/05/22 with c/o dizziness, hypotension; workup for recurrent UTI, acute cystitis. PMH includes dementia, HIV, HTN, HLD, CKD, TIA/stroke, peripheral neuropathy, COPD, partial colectomy, anxiety, depression. ? ?  ?[name] Comments  ? ? [name] pleasantly confused stating she is supposed to have brain surgery today. [name] able to progress gait and walk in room with assist and RW as well as complete HEP. [name] with baseline cognitive deficits that impact safe mobility. Will continue to follow.  ?   ?Recommendations for follow up therapy are one component of a multi-disciplinary discharge planning process, led by the attending physician.  Recommendations may be updated based on patient status, additional functional criteria and insurance authorization. ? ?Follow Up Recommendations ? Skilled nursing-short term rehab (<3 hours/day) ?  ?  ?Assistance Recommended at Discharge Frequent or constant Supervision/Assistance  ?Patient can return home with the following A lot of help with walking and/or transfers;A lot of help with bathing/dressing/bathroom;Assist for transportation;Help with stairs or ramp for entrance;Direct supervision/assist for medications management;Direct supervision/assist for financial management ?  ?Equipment Recommendations ? None recommended by [name]  ?  ?Recommendations for Other Services   ? ? ?  ?Precautions / Restrictions Precautions ?Precautions: Fall;Other (comment) ?Precaution Comments: urine incontinence, fear of falling  ?  ? ?Mobility ? Bed Mobility ?Overal bed mobility: Needs Assistance ?Bed Mobility: Supine to Sit ?  ?  ?Supine to sit: HOB elevated, Min assist ?  ?  ?General bed mobility comments: min assist with increased time to fully scoot hips toward EOB ?  ? ?Transfers ?Overall transfer level: Needs  assistance ?  ?Transfers: Sit to/from Stand ?Sit to Stand: Min assist ?  ?  ?  ?  ?  ?General transfer comment: min assist to stand from bed x 2 trials with cues and assist for hand placement ?  ? ?Ambulation/Gait ?Ambulation/Gait assistance: Min assist ?Gait Distance (Feet): 25 Feet ?Assistive device: Rolling walker (2 wheels) ?Gait Pattern/deviations: Step-through pattern, Decreased stride length, Trunk flexed ?  ?Gait velocity interpretation: <1.8 ft/sec, indicate of risk for recurrent falls ?  ?General Gait Details: cues for proximity to RW with min assist for balance and to advance RW ? ? ?Stairs ?  ?  ?  ?  ?  ? ? ?Wheelchair Mobility ?  ? ?Modified Rankin (Stroke Patients Only) ?  ? ? ?  ?Balance Overall balance assessment: Needs assistance ?  ?Sitting balance-Leahy Scale: Fair ?Sitting balance - Comments: EOB without support ?  ?  ?Standing balance-Leahy Scale: Poor ?Standing balance comment: reliant on BUE support and external assist ?  ?  ?  ?  ?  ?  ?  ?  ?  ?  ?  ?  ? ?  ?Cognition Arousal/Alertness: Awake/alert ?Behavior During Therapy: [facility] for tasks assessed/performed ?Overall Cognitive Status: No family/caregiver present to determine baseline cognitive functioning ?  ?  ?  ?  ?  ?  ?  ?  ?  ?  ?  ?  ?  ?  ?  ?  ?General Comments: [name] states time as "March 1993", [name] convinced she is supposed to transfer to another hospital for brain surgery ?  ?  ? ?  ?Exercises General Exercises - Lower Extremity ?Short Arc Quad: AROM, Seated, 15 reps, Both ?Hip ABduction/ADduction: AROM, Both, Seated, 15 reps ?Hip  Flexion/Marching: AROM, Both, Seated, 15 reps ? ?  ?General Comments   ?  ?  ? ?Pertinent Vitals/Pain Pain Assessment ?Pain Assessment: No/denies pain  ? ? ?Home Living   ?  ?  ?  ?  ?  ?  ?  ?  ?  ?   ?  ?Prior Function    ?  ?  ?   ? ?[name] Goals (current goals can now be found in the care plan section) Progress towards [name] goals: Progressing toward goals ? ?  ?Frequency ? ? ? Min 2X/week ? ? ? ?  ?[name] Plan  Current plan remains appropriate  ? ? ?Co-evaluation   ?  ?  ?  ?  ? ?  ?AM-PAC [name] "6 Clicks" Mobility   ?Outcome Measure ? Help needed turning from your back to your side while in a flat bed without using bedrails?: A Little ?Help needed moving from lying on your back to sitting on the side of a flat bed without using bedrails?: A Little ?Help needed moving to and from a bed to a chair (including a wheelchair)?: A Little ?Help needed standing up from a chair using your arms (e.g., wheelchair or bedside chair)?: A Lot ?Help needed to walk in hospital room?: A Lot ?Help needed climbing 3-5 steps with a railing? : Total ?6 Click Score: 14 ? ?  ?End of Session   ?Activity Tolerance: Patient tolerated treatment well ?Patient left: in chair;with call bell/phone within reach;with chair alarm set ?Nurse Communication: Mobility status ?[name] Visit Diagnosis: Other abnormalities of gait and mobility (R26.89);Muscle weakness (generalized) (M62.81) ?  ? ? ?Time: 6063-0160 ?[name] Time Calculation (min) (ACUTE ONLY): 19 min ? ?Charges:  $Gait Training: 8-22 mins          ?          ? ?Pennye Beeghly P, [name] ?Acute Rehabilitation Services ?Pager: [phone] ?Office: [phone] ? ? ? ?Dain Laseter B Zawadi Aplin ?01/09/2022, 12:45 PM ? ?

## 2022-01-10 LAB — CBC WITH DIFFERENTIAL/PLATELET
Abs Immature Granulocytes: 0.01 10*3/uL (ref 0.00–0.07)
Basophils Absolute: 0 10*3/uL (ref 0.0–0.1)
Basophils Relative: 1 %
Eosinophils Absolute: 0.2 10*3/uL (ref 0.0–0.5)
Eosinophils Relative: 4 %
HCT: 38.8 % (ref 36.0–46.0)
Hemoglobin: 13.1 g/dL (ref 12.0–15.0)
Immature Granulocytes: 0 %
Lymphocytes Relative: 44 %
Lymphs Abs: 2.8 10*3/uL (ref 0.7–4.0)
MCH: 33.3 pg (ref 26.0–34.0)
MCHC: 33.8 g/dL (ref 30.0–36.0)
MCV: 98.7 fL (ref 80.0–100.0)
Monocytes Absolute: 0.6 10*3/uL (ref 0.1–1.0)
Monocytes Relative: 10 %
Neutro Abs: 2.5 10*3/uL (ref 1.7–7.7)
Neutrophils Relative %: 41 %
Platelets: 217 10*3/uL (ref 150–400)
RBC: 3.93 MIL/uL (ref 3.87–5.11)
RDW: 12.8 % (ref 11.5–15.5)
WBC: 6.2 10*3/uL (ref 4.0–10.5)
nRBC: 0 % (ref 0.0–0.2)

## 2022-01-10 LAB — BASIC METABOLIC PANEL
Anion gap: 10 (ref 5–15)
BUN: 49 mg/dL — ABNORMAL HIGH (ref 8–23)
CO2: 21 mmol/L — ABNORMAL LOW (ref 22–32)
Calcium: 9.7 mg/dL (ref 8.9–10.3)
Chloride: 108 mmol/L (ref 98–111)
Creatinine, Ser: 1.68 mg/dL — ABNORMAL HIGH (ref 0.44–1.00)
GFR, Estimated: 31 mL/min — ABNORMAL LOW (ref >60)
Glucose, Bld: 114 mg/dL — ABNORMAL HIGH (ref 70–99)
Potassium: 4.9 mmol/L (ref 3.5–5.1)
Sodium: 139 mmol/L (ref 135–145)

## 2022-01-10 LAB — CULTURE, BLOOD (ROUTINE X 2)
Culture: NO GROWTH
Special Requests: ADEQUATE

## 2022-01-10 MED ORDER — ALBUTEROL SULFATE (2.5 MG/3ML) 0.083% IN NEBU: 3.0000 mL | INHALATION_SOLUTION | Freq: Four times a day (QID) | RESPIRATORY_TRACT | Status: DC | PRN

## 2022-01-10 NOTE — Progress Notes (Signed)
?PROGRESS NOTE ? ? ? ?[name]  YJE:[id number] DOB: 1941/10/12 DOA: 01/05/2022 ?PCP: Wannetta Sender, FNP  ? ?  ?Brief Narrative:  ?81 year old female with a history of dementia, HIV, hypertension, hyperlipidemia, CKD, history of stroke, neuropathy, COPD, history of partial colectomy who is a long-term care facility resident presenting with a UTI.  She failed outpatient therapy and presented with multidrug-resistant bacteria.  ID recommended 3 days of Merrem along with hydration.  PT recommended SNF. ? ? ?New events last 24 hours / Subjective: ?Peer-to-peer today, SNF denied, recommended return to LTC.  Patient reports feeling fine.  States she cannot walk.  No pain, fevers, urinary complaints. ? ?Assessment & Plan: ?  ?Principal Problem: ?  Acute cystitis ?Active Problems: ?  HIV (human immunodeficiency virus infection) ([place]) ?  HLD (hyperlipidemia) ?  Essential hypertension ?  AKI (acute kidney injury) ([place]) ?  Hypotension ? ?Acute cystitis ? Culture on 2/28 showed ESBL Proteus mirabilis and Enterococcus faecalis, repeat culture on 3/13 also shows Proteus ? Patient has completed Merrem, this is now resolved ? ?AKI ? Resolved ? ?HIV ? Continue Triumeq ? ?Hyperlipidemia ? Continue Lipitor 20 mg daily ? ?Hypertension ? Continue losartan 12.5 milligrams daily, labetalol 100 mg twice daily ? ?Depression/anxiety ? Continue Remeron 15 mg nightly, Cymbalta 60 mg daily, Klonopin 1 mg twice daily as needed, Abilify 10 mg daily ? ?Nutrition Problem: Increased nutrient needs ?Etiology: acute illness ? ? ?DVT prophylaxis: SCDs ?Code Status: Full ?Family Communication: None at bedside ?Coming From: LTC ?Disposition Plan: Probably LTC ?Barriers to Discharge: Insurance/approval ? ?Consultants:  ?None ? ?Procedures:  ?None ? ?Antimicrobials:  ?Anti-infectives (From admission, onward)  ? ? Start     Dose/Rate Route Frequency Ordered Stop  ? 01/07/22 1800  vancomycin (VANCOCIN) IVPB 1000 mg/200 mL premix  Status:   Discontinued       ? 1,000 mg ?200 mL/hr over 60 Minutes Intravenous Every 48 hours 01/05/22 1543 01/06/22 1258  ? 01/06/22 1000  abacavir-dolutegravir-lamiVUDine (TRIUMEQ) 600-50-300 MG per tablet 1 tablet       ? 1 tablet Oral Daily 01/06/22 0639    ? 01/05/22 1600  vancomycin (VANCOREADY) IVPB 1500 mg/300 mL       ? 1,500 mg ?150 mL/hr over 120 Minutes Intravenous  Once 01/05/22 1540 01/05/22 2058  ? 01/05/22 1600  meropenem (MERREM) 500 mg in sodium chloride 0.9 % 100 mL IVPB       ? 500 mg ?200 mL/hr over 30 Minutes Intravenous Every 12 hours 01/05/22 1540 01/07/22 2144  ? ?  ? ? ? ?Objective: ?Vitals:  ? 01/09/22 2132 01/10/22 0300 01/10/22 0808 01/10/22 1608  ?BP: (!) 109/97 (!) 124/53 (!) 160/59 (!) 114/92  ?Pulse: 73 70 73 74  ?Resp: '19 16 16 16  '$ ?Temp: 98.1 ?F (36.7 ?C) 98.3 ?F (36.8 ?C) 97.9 ?F (36.6 ?C) (!) 97.5 ?F (36.4 ?C)  ?TempSrc: Oral Oral Oral   ?SpO2: 96% 94% 97% 95%  ?Weight:      ?Height:      ? ? ?Intake/Output Summary (Last 24 hours) at 01/10/2022 1618 ?Last data filed at 01/10/2022 1300 ?Gross per 24 hour  ?Intake 2320.01 ml  ?Output 1200 ml  ?Net 1120.01 ml  ? ?Filed Weights  ? 01/07/22 0510 01/08/22 0500 01/09/22 0524  ?Weight: 72.3 kg 74.2 kg 74.2 kg  ? ? ?Examination:  ?General exam: Appears calm and comfortable  ?Respiratory system: Clear to auscultation. Respiratory effort normal. No respiratory distress. No conversational dyspnea.  ?  Cardiovascular system: S1 & S2 heard, RRR. No pedal edema. ?Gastrointestinal system: Abdomen is nondistended, soft and nontender. Normal bowel sounds heard. ?Central nervous system: Alert and oriented. No focal neurological deficits. Speech clear.  ?Extremities: Symmetric in appearance  ?Skin: No rashes, lesions or ulcers on exposed skin  ?Psychiatry: Judgement and insight appear limited. Mood & affect appropriate.  ? ?Data Reviewed: I have personally reviewed following labs and imaging studies ? ?CBC: ?Recent Labs  ?Lab 01/05/22 ?1353 01/06/22 ?0321  01/08/22 ?0105 01/09/22 ?0204 01/10/22 ?0100  ?WBC 5.7 5.8 5.6 7.1 6.2  ?NEUTROABS 2.6 2.7 2.4 2.7 2.5  ?HGB 13.0 12.0 13.3 13.9 13.1  ?HCT 38.9 35.6* 40.0 41.3 38.8  ?MCV 100.0 99.4 98.5 99.0 98.7  ?PLT 168 168 198 202 217  ? ?Basic Metabolic Panel: ?Recent Labs  ?Lab 01/05/22 ?1353 01/06/22 ?0321 01/08/22 ?0105 01/09/22 ?0204 01/10/22 ?0100  ?NA 142 138 139 136 139  ?K 5.0 4.7 4.8 4.9 4.9  ?CL 112* 111 105 104 108  ?CO2 23 20* 22 24 21*  ?GLUCOSE 109* 99 122* 115* 114*  ?BUN 32* 29* 32* 40* 49*  ?CREATININE 1.84* 1.71* 1.43* 1.77* 1.68*  ?CALCIUM 10.1 9.7 10.4* 10.3 9.7  ?MG  --  1.8  --   --   --   ? ?GFR: ?Estimated Creatinine Clearance (by C-G formula based on SCr of 1.68 mg/dL (H)) ?Female: 26.4 mL/min (A) ?Female: 32.3 mL/min (A) ? ?Liver Function Tests: ?Recent Labs  ?Lab 01/06/22 ?0321  ?AST 17  ?ALT 16  ?ALKPHOS 69  ?BILITOT 0.6  ?PROT 5.3*  ?ALBUMIN 3.1*  ? ?Sepsis Labs: ?Recent Labs  ?Lab 01/05/22 ?1603  ?LATICACIDVEN 0.9  ? ? ?Recent Results (from the past 240 hour(s))  ?Urine Culture     Status: Abnormal  ? Collection Time: 01/05/22  3:00 PM  ? Specimen: In/Out Cath Urine  ?Result Value Ref Range Status  ? Specimen Description IN/OUT CATH URINE  Final  ? Special Requests NONE  Final  ? Culture (A)  Final  ?  >=100,000 COLONIES/mL PROTEUS MIRABILIS ?Susceptibility Pattern Suggests Possibility of an Extended Spectrum Beta Lactamase Producer. Contact Laboratory Within 7 Days if Confirmation Warranted. ?Performed at [facility], [place] [address] [place], [place] [address] ?  ? Report Status 01/09/2022 FINAL  Final  ? Organism ID, Bacteria PROTEUS MIRABILIS (A)  Final  ?    Susceptibility  ? Proteus mirabilis - MIC*  ?  AMPICILLIN >=32 RESISTANT Resistant   ?  CEFAZOLIN >=64 RESISTANT Resistant   ?  CEFEPIME 0.5 SENSITIVE Sensitive   ?  CEFTRIAXONE 4 RESISTANT Resistant   ?  CIPROFLOXACIN >=4 RESISTANT Resistant   ?  GENTAMICIN <=1 SENSITIVE Sensitive   ?  IMIPENEM 2 SENSITIVE Sensitive   ?   NITROFURANTOIN >=512 RESISTANT Resistant   ?  TRIMETH/SULFA >=320 RESISTANT Resistant   ?  AMPICILLIN/SULBACTAM >=32 RESISTANT Resistant   ?  PIP/TAZO <=4 SENSITIVE Sensitive   ?  * >=100,000 COLONIES/mL PROTEUS MIRABILIS  ?Blood culture (routine x 2)     Status: None  ? Collection Time: 01/05/22  4:03 PM  ? Specimen: BLOOD  ?Result Value Ref Range Status  ? Specimen Description BLOOD BLOOD LEFT FOREARM  Final  ? Special Requests   Final  ?  BOTTLES DRAWN AEROBIC AND ANAEROBIC Blood Culture adequate volume  ? Culture   Final  ?  NO GROWTH 5 DAYS ?Performed at [facility], [place] [address] [place], [place] [address] ?  ?  Report Status 01/10/2022 FINAL  Final  ?Resp Panel by RT-PCR (Flu A&B, Covid) Nasopharyngeal Swab     Status: None  ? Collection Time: 01/05/22  4:41 PM  ? Specimen: Nasopharyngeal Swab; Nasopharyngeal(NP) swabs in vial transport medium  ?Result Value Ref Range Status  ? SARS Coronavirus 2 by RT PCR NEGATIVE NEGATIVE Final  ?  Comment: (NOTE) ?SARS-CoV-2 target nucleic acids are NOT DETECTED. ? ?The SARS-CoV-2 RNA is generally detectable in upper respiratory ?specimens during the acute phase of infection. The lowest ?concentration of SARS-CoV-2 viral copies this assay can detect is ?138 copies/mL. A negative result does not preclude SARS-Cov-2 ?infection and should not be used as the sole basis for treatment or ?other patient management decisions. A negative result may occur with  ?improper specimen collection/handling, submission of specimen other ?than nasopharyngeal swab, presence of viral mutation(s) within the ?areas targeted by this assay, and inadequate number of viral ?copies(<138 copies/mL). A negative result must be combined with ?clinical observations, patient history, and epidemiological ?information. The expected result is Negative. ? ?Fact Sheet for Patients:  ?EntrepreneurPulse.com.au ? ?Fact Sheet for Healthcare Providers:   ?IncredibleEmployment.be ? ?This test is no t yet approved or cleared by the [place] FDA and  ?has been authorized for detection and/or diagnosis of SARS-CoV-2 by ?FDA under an Emergency Use Authorization (EUA). This EUA will remain  ?i

## 2022-01-10 NOTE — Progress Notes (Signed)
Mobility Specialist Progress Note  ? ? 01/10/22 1701  ?Mobility  ?Activity Transferred to/from [facility]  ?Level of Assistance Moderate assist, patient does 50-74%  ?Assistive Device Front wheel walker  ?Distance Ambulated (ft) 4 ft ?(2+2)  ?Activity Response Tolerated fair  ?$Mobility charge 1 Mobility  ? ?Pt received in bed and agreeable. Attempted BM, somewhat successful but pt seemed to not be pushing and was looking off at the wall not fully present in situation. Pt kept saying "not yet" and that someone else in the room from the "Y lines" was talking to her. RN present for pericare. Returned to bed with RN present.  ? ?[name] ?Mobility Specialist  ?  ?

## 2022-01-10 NOTE — TOC Progression Note (Addendum)
Transition of Care (TOC) - Progression Note  ? ? ?Patient Details  ?Name: [name] ?MRN: [id number] ?Date of Birth: 1941/01/18 ? ?Transition of Care (TOC) CM/SW Contact  ?Joanne Chars, LCSW ?Phone Number: ?01/10/2022, 9:06 AM ? ?Clinical Narrative:   Insurance Josem Kaufmann still shows as pending in [facility] portal.  CSW called Everlene Balls, was told this Josem Kaufmann was sent to peer to peer, due by 3/20 at 930am.  MD needs to call [phone] option 5 for peer to peer review.  MD notified.  ? ?1500: Per MD, peer to peer denied.  This result not reflected in [facility] yet, called [employer] and they do not have update.  Unable to to reach [place] at [facility].  Per notes, pt may be able to return as LTC pt. ? ? ?Expected Discharge Plan: [place] ?Barriers to Discharge: Barriers Resolved ? ?Expected Discharge Plan and Services ?Expected Discharge Plan: [place] ?  ?  ?  ?Living arrangements for the past 2 months: [place] ?Expected Discharge Date: 01/09/22               ?  ?  ?  ?  ?  ?  ?  ?  ?  ?  ? ? ?Social Determinants of Health (SDOH) Interventions ?  ? ?Readmission Risk Interventions ?Readmission Risk Prevention Plan 06/23/2021 05/11/2021  ?Transportation Screening Complete Complete  ?PCP or Specialist Appt within 3-5 Days - Complete  ?[place] or Home Care Consult Complete Complete  ?Social Work Consult for [place] Planning/Counseling Complete Complete  ?Palliative Care Screening Not Applicable Not Applicable  ?Medication Review Press photographer) Complete Complete  ?Some recent data might be hidden  ? ? ?

## 2022-01-11 LAB — BASIC METABOLIC PANEL
Anion gap: 7 (ref 5–15)
Anion gap: 9 (ref 5–15)
BUN: 51 mg/dL — ABNORMAL HIGH (ref 8–23)
BUN: 48 mg/dL — ABNORMAL HIGH (ref 8–23)
CO2: 25 mmol/L (ref 22–32)
CO2: 21 mmol/L — ABNORMAL LOW (ref 22–32)
Calcium: 9.9 mg/dL (ref 8.9–10.3)
Calcium: 9.9 mg/dL (ref 8.9–10.3)
Chloride: 106 mmol/L (ref 98–111)
Chloride: 106 mmol/L (ref 98–111)
Creatinine, Ser: 1.54 mg/dL — ABNORMAL HIGH (ref 0.44–1.00)
Creatinine, Ser: 1.45 mg/dL — ABNORMAL HIGH (ref 0.44–1.00)
GFR, Estimated: 34 mL/min — ABNORMAL LOW (ref >60)
GFR, Estimated: 36 mL/min — ABNORMAL LOW (ref >60)
Glucose, Bld: 115 mg/dL — ABNORMAL HIGH (ref 70–99)
Glucose, Bld: 100 mg/dL — ABNORMAL HIGH (ref 70–99)
Potassium: 5.6 mmol/L — ABNORMAL HIGH (ref 3.5–5.1)
Potassium: 4.8 mmol/L (ref 3.5–5.1)
Sodium: 138 mmol/L (ref 135–145)
Sodium: 136 mmol/L (ref 135–145)

## 2022-01-11 NOTE — Progress Notes (Signed)
?PROGRESS NOTE ? ? ? ?[name]  DEY:[id number] DOB: 10-10-1941 DOA: 01/05/2022 ?PCP: Wannetta Sender, FNP  ? ?  ?Brief Narrative:  ?81 year old female with a history of dementia, HIV, hypertension, hyperlipidemia, CKD, history of stroke, neuropathy, COPD, history of partial colectomy who is a long-term care facility resident presenting with a UTI.  She failed outpatient therapy and presented with multidrug-resistant bacteria.  ID recommended 3 days of Merrem along with hydration.  PT recommended SNF. ? ?New events last 24 hours / Subjective: ?Doing well, no complaints.  Thinks she has brain surgery coming up.  Waiting for approval to get back to her long-term care facility. ? ?Assessment & Plan: ?  ?Principal Problem: ?  Acute cystitis ?Active Problems: ?  HIV (human immunodeficiency virus infection) ([place]) ?  HLD (hyperlipidemia) ?  Essential hypertension ?  AKI (acute kidney injury) ([place]) ?  Hypotension ? ?Acute cystitis- Resolved ?            Culture on 2/28 showed ESBL Proteus mirabilis and Enterococcus faecalis, repeat culture on 3/13 also shows Proteus ?            Patient has completed Merrem ?AKI ?            Resolved ?  ?HIV ?            Continue Triumeq ?  ?Hyperlipidemia ?            Continue Lipitor 20 mg daily ?  ?Hypertension ?            Continue losartan 12.5 mg daily, labetalol 100 mg twice daily ?  ?Depression/anxiety ?            Continue Remeron 15 mg nightly, Cymbalta 60 mg daily, Klonopin 1 mg twice daily as needed, Abilify 10 mg daily ?  ?Nutrition Problem: Increased nutrient needs ?Etiology: acute illness ?  ?  ?DVT prophylaxis: SCDs ?Code Status: Full ?Family Communication: None at bedside ?Coming From: LTC ?Disposition Plan: Probably LTC ?Barriers to Discharge: LTC transfer/acceptance ?  ?Consultants:  ?None ?  ?Procedures:  ?None ?  ? ?Antimicrobials:  ?Anti-infectives (From admission, onward)  ? ? Start     Dose/Rate Route Frequency Ordered Stop  ? 01/07/22 1800  vancomycin  (VANCOCIN) IVPB 1000 mg/200 mL premix  Status:  Discontinued       ? 1,000 mg ?200 mL/hr over 60 Minutes Intravenous Every 48 hours 01/05/22 1543 01/06/22 1258  ? 01/06/22 1000  abacavir-dolutegravir-lamiVUDine (TRIUMEQ) 600-50-300 MG per tablet 1 tablet       ? 1 tablet Oral Daily 01/06/22 0639    ? 01/05/22 1600  vancomycin (VANCOREADY) IVPB 1500 mg/300 mL       ? 1,500 mg ?150 mL/hr over 120 Minutes Intravenous  Once 01/05/22 1540 01/05/22 2058  ? 01/05/22 1600  meropenem (MERREM) 500 mg in sodium chloride 0.9 % 100 mL IVPB       ? 500 mg ?200 mL/hr over 30 Minutes Intravenous Every 12 hours 01/05/22 1540 01/07/22 2144  ? ?  ? ? ? ?Objective: ?Vitals:  ? 01/10/22 2008 01/11/22 0316 01/11/22 0317 01/11/22 0825  ?BP: 101/63  137/62 (!) 161/71  ?Pulse:   64 69  ?Resp:   17 18  ?Temp:   98.1 ?F (36.7 ?C) 98.2 ?F (36.8 ?C)  ?TempSrc:   Oral   ?SpO2:   100% 100%  ?Weight:  77.1 kg    ?Height:      ? ? ?Intake/Output  Summary (Last 24 hours) at 01/11/2022 1259 ?Last data filed at 01/11/2022 0750 ?Gross per 24 hour  ?Intake 485.09 ml  ?Output 1850 ml  ?Net -1364.91 ml  ? ?Filed Weights  ? 01/08/22 0500 01/09/22 0524 01/11/22 0316  ?Weight: 74.2 kg 74.2 kg 77.1 kg  ? ? ?Examination:  ?General exam: Appears calm and comfortable  ?Respiratory system: Clear to auscultation. Respiratory effort normal. No respiratory distress. No conversational dyspnea.  ?Cardiovascular system: S1 & S2 heard, RRR. ?Gastrointestinal system: Abdomen is nondistended, soft and nontender. Normal bowel sounds heard. ?Central nervous system: No focal neurological deficits. Speech clear.  ?Extremities: Symmetric in appearance  ?Skin: No rashes, lesions or ulcers on exposed skin  ?Psychiatry: Mood & affect appropriate.  ? ?Data Reviewed: I have personally reviewed following labs and imaging studies ? ?CBC: ?Recent Labs  ?Lab 01/05/22 ?1353 01/06/22 ?0321 01/08/22 ?0105 01/09/22 ?0204 01/10/22 ?0100  ?WBC 5.7 5.8 5.6 7.1 6.2  ?NEUTROABS 2.6 2.7 2.4 2.7 2.5   ?HGB 13.0 12.0 13.3 13.9 13.1  ?HCT 38.9 35.6* 40.0 41.3 38.8  ?MCV 100.0 99.4 98.5 99.0 98.7  ?PLT 168 168 198 202 217  ? ?Basic Metabolic Panel: ?Recent Labs  ?Lab 01/06/22 ?0321 01/08/22 ?0105 01/09/22 ?4010 01/10/22 ?0100 01/11/22 ?0115 01/11/22 ?2725  ?NA 138 139 136 139 138 136  ?K 4.7 4.8 4.9 4.9 5.6* 4.8  ?CL 111 105 104 108 106 106  ?CO2 20* 22 24 21* 25 21*  ?GLUCOSE 99 122* 115* 114* 115* 100*  ?BUN 29* 32* 40* 49* 51* 48*  ?CREATININE 1.71* 1.43* 1.77* 1.68* 1.54* 1.45*  ?CALCIUM 9.7 10.4* 10.3 9.7 9.9 9.9  ?MG 1.8  --   --   --   --   --   ? ?GFR: ?Estimated Creatinine Clearance (by C-G formula based on SCr of 1.45 mg/dL (H)) ?Female: 31.1 mL/min (A) ?Female: 38.2 mL/min (A) ? ?Liver Function Tests: ?Recent Labs  ?Lab 01/06/22 ?0321  ?AST 17  ?ALT 16  ?ALKPHOS 69  ?BILITOT 0.6  ?PROT 5.3*  ?ALBUMIN 3.1*  ? ?Sepsis Labs: ?Recent Labs  ?Lab 01/05/22 ?1603  ?LATICACIDVEN 0.9  ? ? ?Recent Results (from the past 240 hour(s))  ?Urine Culture     Status: Abnormal  ? Collection Time: 01/05/22  3:00 PM  ? Specimen: In/Out Cath Urine  ?Result Value Ref Range Status  ? Specimen Description IN/OUT CATH URINE  Final  ? Special Requests NONE  Final  ? Culture (A)  Final  ?  >=100,000 COLONIES/mL PROTEUS MIRABILIS ?Susceptibility Pattern Suggests Possibility of an Extended Spectrum Beta Lactamase Producer. Contact Laboratory Within 7 Days if Confirmation Warranted. ?Performed at [facility], [place] [address], [place], [place] [address] ?  ? Report Status 01/09/2022 FINAL  Final  ? Organism ID, Bacteria PROTEUS MIRABILIS (A)  Final  ?    Susceptibility  ? Proteus mirabilis - MIC*  ?  AMPICILLIN >=32 RESISTANT Resistant   ?  CEFAZOLIN >=64 RESISTANT Resistant   ?  CEFEPIME 0.5 SENSITIVE Sensitive   ?  CEFTRIAXONE 4 RESISTANT Resistant   ?  CIPROFLOXACIN >=4 RESISTANT Resistant   ?  GENTAMICIN <=1 SENSITIVE Sensitive   ?  IMIPENEM 2 SENSITIVE Sensitive   ?  NITROFURANTOIN >=512 RESISTANT Resistant   ?  TRIMETH/SULFA  >=320 RESISTANT Resistant   ?  AMPICILLIN/SULBACTAM >=32 RESISTANT Resistant   ?  PIP/TAZO <=4 SENSITIVE Sensitive   ?  * >=100,000 COLONIES/mL PROTEUS MIRABILIS  ?Blood culture (routine x 2)  Status: None  ? Collection Time: 01/05/22  4:03 PM  ? Specimen: BLOOD  ?Result Value Ref Range Status  ? Specimen Description BLOOD BLOOD LEFT FOREARM  Final  ? Special Requests   Final  ?  BOTTLES DRAWN AEROBIC AND ANAEROBIC Blood Culture adequate volume  ? Culture   Final  ?  NO GROWTH 5 DAYS ?Performed at [facility], [place] [address] [place], [place] [address] ?  ? Report Status 01/10/2022 FINAL  Final  ?Resp Panel by RT-PCR (Flu A&B, Covid) Nasopharyngeal Swab     Status: None  ? Collection Time: 01/05/22  4:41 PM  ? Specimen: Nasopharyngeal Swab; Nasopharyngeal(NP) swabs in vial transport medium  ?Result Value Ref Range Status  ? SARS Coronavirus 2 by RT PCR NEGATIVE NEGATIVE Final  ?  Comment: (NOTE) ?SARS-CoV-2 target nucleic acids are NOT DETECTED. ? ?The SARS-CoV-2 RNA is generally detectable in upper respiratory ?specimens during the acute phase of infection. The lowest ?concentration of SARS-CoV-2 viral copies this assay can detect is ?138 copies/mL. A negative result does not preclude SARS-Cov-2 ?infection and should not be used as the sole basis for treatment or ?other patient management decisions. A negative result may occur with  ?improper specimen collection/handling, submission of specimen other ?than nasopharyngeal swab, presence of viral mutation(s) within the ?areas targeted by this assay, and inadequate number of viral ?copies(<138 copies/mL). A negative result must be combined with ?clinical observations, patient history, and epidemiological ?information. The expected result is Negative. ? ?Fact Sheet for Patients:  ?EntrepreneurPulse.com.au ? ?Fact Sheet for Healthcare Providers:  ?IncredibleEmployment.be ? ?This test is no t yet approved or cleared by the  [place] FDA and  ?has been authorized for detection and/or diagnosis of SARS-CoV-2 by ?FDA under an Emergency Use Authorization (EUA). This EUA will remain  ?in effect (meaning this test can be use

## 2022-01-11 NOTE — TOC Progression Note (Signed)
Transition of Care (TOC) - Progression Note  ? ? ?Patient Details  ?Name: [name] ?MRN: [id number] ?Date of Birth: April 28, 1941 ? ?Transition of Care (TOC) CM/SW Contact  ?Carolin Sicks, LCSWA ?Phone Number: ?01/11/2022, 3:27 PM ? ?Clinical Narrative:    ?CSW received a message from [facility].  [facility] will have a female bed on Monday for pt.  TOC will continue to assist with disposition planning. ? ? ?Expected Discharge Plan: [place] ?Barriers to Discharge: Barriers Resolved ? ?Expected Discharge Plan and Services ?Expected Discharge Plan: [place] ?  ?  ?  ?Living arrangements for the past 2 months: [place] ?Expected Discharge Date: 01/09/22               ?  ?  ?  ?  ?  ?  ?  ?  ?  ?  ? ? ?Social Determinants of Health (SDOH) Interventions ?  ? ?Readmission Risk Interventions ?Readmission Risk Prevention Plan 06/23/2021 05/11/2021  ?Transportation Screening Complete Complete  ?PCP or Specialist Appt within 3-5 Days - Complete  ?[place] or Home Care Consult Complete Complete  ?Social Work Consult for [place] Planning/Counseling Complete Complete  ?Palliative Care Screening Not Applicable Not Applicable  ?Medication Review Press photographer) Complete Complete  ?Some recent data might be hidden  ? ? ?

## 2022-01-12 LAB — BASIC METABOLIC PANEL
Anion gap: 9 (ref 5–15)
BUN: 49 mg/dL — ABNORMAL HIGH (ref 8–23)
CO2: 22 mmol/L (ref 22–32)
Calcium: 10.1 mg/dL (ref 8.9–10.3)
Chloride: 106 mmol/L (ref 98–111)
Creatinine, Ser: 1.61 mg/dL — ABNORMAL HIGH (ref 0.44–1.00)
GFR, Estimated: 32 mL/min — ABNORMAL LOW (ref >60)
Glucose, Bld: 100 mg/dL — ABNORMAL HIGH (ref 70–99)
Potassium: 4.7 mmol/L (ref 3.5–5.1)
Sodium: 137 mmol/L (ref 135–145)

## 2022-01-12 NOTE — Care Management Important Message (Signed)
Important Message ? ?Patient Details  ?Name: [name] ?MRN: [id number] ?Date of Birth: 1941-09-04 ? ? ?Medicare Important Message Given:  Other (see comment) ? ? ? ? ?[name]  [name] ?01/12/2022, 3:12 PM ?

## 2022-01-12 NOTE — TOC Transition Note (Signed)
Transition of Care (TOC) - CM/SW Discharge Note ? ? ?Patient Details  ?Name: [name] ?MRN: [id number] ?Date of Birth: 05-04-1941 ? ?Transition of Care (TOC) CM/SW Contact:  ?Emeterio Reeve, LCSW ?Phone Number: ?01/12/2022, 11:32 AM ? ? ?Clinical Narrative:    ? ?Per MD patient ready for DC to [facility]. RN, patient, patient's family, and facility notified of DC. Discharge Summary and FL2 sent to facility. DC packet on chart. Insurance auth for SNF was denied, Pt will DC for LTC using her medicaid. Ambulance transport requested for patient.  ?  ?RN to call report to [phone]. Pt will go to room B13-2.  ? ?CSW will sign off for now as social work intervention is no longer needed. Please consult [place] again if new needs arise. ? ? ?Final next level of care: [place] ?Barriers to Discharge: Barriers Resolved ? ? ?Patient Goals and CMS Choice ?  ?CMS Medicare.gov Compare Post Acute Care list provided to:: Patient ?Choice offered to / list presented to : [facility] POA / Guardian ? ?Discharge Placement ?  ?           ?Patient chooses bed at: Other - please specify in the comment section below: [facility]) ?Patient to be transferred to facility by: ptar ?Name of family member notified: POA [name] ?Patient and family notified of of transfer: 01/09/22 ? ?Discharge Plan and Services ?  ?  ?           ?  ?  ?  ?  ?  ?  ?  ?  ?  ?  ? ?Social Determinants of Health (SDOH) Interventions ?  ? ? ?Readmission Risk Interventions ?Readmission Risk Prevention Plan 06/23/2021 05/11/2021  ?Transportation Screening Complete Complete  ?PCP or Specialist Appt within 3-5 Days - Complete  ?[place] or Home Care Consult Complete Complete  ?Social Work Consult for [place] Planning/Counseling Complete Complete  ?Palliative Care Screening Not Applicable Not Applicable  ?Medication Review Press photographer) Complete Complete  ?Some recent data might be hidden  ? ? ?Emeterio Reeve, LCSW ?Clinical Social Worker ? ? ? ?

## 2022-01-12 NOTE — Progress Notes (Signed)
3/20 IM Letter mailed to patient's home address due to patient's isolation status. ?

## 2022-01-12 NOTE — Progress Notes (Signed)
Report given to [place] [facility] [employer]. ?

## 2022-01-12 NOTE — Plan of Care (Signed)

## 2022-01-12 NOTE — Progress Notes (Signed)
Mobility Specialist Progress Note: ? ? 01/12/22 1020  ?Mobility  ?Activity Transferred to/from [facility];Transferred from bed to chair  ?Level of Assistance Moderate assist, patient does 50-74%  ?Assistive Device Front wheel walker  ?Distance Ambulated (ft) 6 ft  ?Activity Response Tolerated well  ?$Mobility charge 1 Mobility  ? ?Pt received in bed needing to get up to get bed changed. No complaints of pain. Pt MinA +2 to step to [employer]. Required peri-care then ModA to step to recliner. Left in recliner with call bell in reach and all needs met.  ? ?Kadince Boxley ?Mobility Specialist ?Primary Phone [phone] ? ?

## 2022-01-20 ENCOUNTER — Ambulatory Visit (INDEPENDENT_AMBULATORY_CARE_PROVIDER_SITE_OTHER): Payer: Medicare Other | Admitting: Physician Assistant

## 2022-01-20 ENCOUNTER — Other Ambulatory Visit: Payer: Self-pay

## 2022-01-20 VITALS — BP 83/51 | HR 66 | Ht 62.0 in | Wt 159.0 lb

## 2022-01-20 DIAGNOSIS — R339 Retention of urine, unspecified: Secondary | ICD-10-CM | POA: Diagnosis not present

## 2022-01-20 DIAGNOSIS — N390 Urinary tract infection, site not specified: Secondary | ICD-10-CM | POA: Diagnosis not present

## 2022-01-20 DIAGNOSIS — N3000 Acute cystitis without hematuria: Secondary | ICD-10-CM

## 2022-01-20 DIAGNOSIS — R300 Dysuria: Secondary | ICD-10-CM | POA: Diagnosis not present

## 2022-01-20 MED ORDER — CEFEPIME HCL 1 G IV SOLR: 1.0000 g | Freq: Two times a day (BID) | INTRAVENOUS | 0 refills | Status: AC

## 2022-01-20 NOTE — Progress Notes (Signed)
? ?Assessment: ?1. Urinary retention   ?2. Acute cystitis without hematuria   ?3. Dysuria   ?4. Recurrent UTI   ? ?Plan: Indwelling Foley placed and will remain until follow-up appointment.  Will discontinue Myrbetriq at this time.  MDX culture ordered and will restart the patient on antibiotics.  Will adjust therapy pending culture results.  Based on previous cultures, will start cefepime 1 g IM every 12 hours for 7 days.  Catheter changes every month with daily catheter care. ? ?Chief Complaint: Hospital follow-up ? ?HPI: ?[name] is a 81 y.o. adult who presents for continued evaluation of recurrent UTIs.  Since the patient's last visit on 2/28, she was hospitalized for treatment of UTI, acute kidney injury after evaluation in the emergency department for altered mental status.  She was discharged to long-term care on 01/09/2022 following treatment with IV meropenem as was recommended by infectious disease.  Currently, she resides at [facility] long-term nursing facility.  She presents today complaining of urinary burning and lower abdominal pain.  No recent fever, chills, nausea.  Mobility is a significant issue for the patient and although she can stand with a walker and assistance, she states she cannot walk.  I am unable to ascertain whether she uses a bedpan or transfer to a bedside commode for voiding.  Nursing assistant reports the patient is in diapers at all times.  Remainder of patient's history since discharge is somewhat limited secondary to her dementia.  Nursing home med list indicates patient is on Myrbetriq 25 mg daily. ? ?Patient is catheterized and residual urine = >266m ?UA = greater than 30 WBCs, no RBCs, triple phosphate crystals, many bacteria, nitrite positive ? ?History of recurrent UTI ?-Urine cx 2/28 with ESBL proteus mirabilis and enterococcus faecalis sensitive to ampicillin ?-Repeat urine culture sent on 3/13 is also growing Proteus mirabilis again pending sensitivity at  this time.  Per ID recommendation, patient was given 3 days of IV meropenem.  No need of antibiotics at discharge. ?-CT scan of abdomen 3/13 with bladder wall thickening.  Last seen by urology as an outpatient on 2/28. ?-PTA, on Myrbetriq 25 mg daily ? ?Portions of the above documentation were copied from a prior visit for review purposes only. ? ?Allergies: ?Allergies  ?Allergen Reactions  ? Bee Venom Anaphylaxis  ? Promethazine Swelling  ? Tenofovir Disoproxil Fumarate [Tenofovir Disoproxil Fumarate] Other (See Comments)  ?  Renal failure, (08/27/2006)  ? Compazine  [Prochlorperazine Edisylate] Nausea And Vomiting  ? Haloperidol Lactate Other (See Comments)  ?  Reaction is muscle tension, causes severe spasms in face and neck. Forces eyes to roll in the back of the head  ? Sulfa Antibiotics Diarrhea  ? Brompheniramine-Acetaminophen Other (See Comments)  ?  unknown  ? Chlorcyclizine Other (See Comments)  ?  Unknown  ? Chlorpromazine Other (See Comments)  ?  Unknown  ? Codeine Itching and Nausea And Vomiting  ? Navane [Thiothixene] Other (See Comments) and Nausea And Vomiting  ?  Same muscle spasm reaction as haldol  ? Prednisone Other (See Comments)  ?  Brief mild psychosis and agitation  ? Promethazine Hcl Other (See Comments)  ? Propoxyphene N-Acetaminophen Itching and Nausea And Vomiting  ? Morphine Itching and Swelling  ? ? ?PMH: ?Past Medical History:  ?Diagnosis Date  ? Anxiety   ? Arthritis   ? Bronchitis   ? Bronchitis   ? CKD (chronic kidney disease) stage 3, GFR 30-59 ml/min (HCC) 05/04/2016  ? Complication of  anesthesia   ? pt states she had a cardiac arrest during hysterectomy 1985.  Pt had several surgies since then that were uneventful  ? Confusion 01/05/2022  ? COPD (chronic obstructive pulmonary disease) ([place])   ? Dementia ([place]) 01/05/2022  ? Depression   ? Dizziness 01/05/2022  ? Dysuria 03/27/2019  ? Elevated LFTs 05/04/2016  ? History of TIAs 1981  ? left side weakness  ? HIV (human immunodeficiency  virus infection) ([place])   ? Hypertension   ? Kidney disease related to HIV infection ([place])   ? pt states she has to have her creatinine level checked often   ? Low blood pressure 01/05/2022  ? Moderate single current episode of major depressive disorder ([place]) 03/12/2021  ? Nausea 03/03/2017  ? Peripheral neuropathy   ? bilateral feet  ? Seasonal allergies 03/27/2019  ? Sinusitis 05/06/2018  ? UTI (urinary tract infection) 03/03/2017  ? Weight loss 03/03/2017  ? ? ?PSH: ?Past Surgical History:  ?Procedure Laterality Date  ? ABDOMINAL HYSTERECTOMY  1985  ? BACK SURGERY    ? BIOPSY  07/15/2020  ? Procedure: BIOPSY;  Surgeon: Daneil Dolin, MD;  [facility];  Service: Endoscopy;;  ? CATARACT EXTRACTION, BILATERAL    ? CHOLECYSTECTOMY    ? COLONOSCOPY  08/10/2002  ? NUR: Normal colonoscopy except for some hemorrhoids and some erythema at the dentate line  ? COLONOSCOPY  12/10/2006  ? Edwards:Diffuse colitis in the transverse and descending colon/This is not entirely typical of ischemic colitis or her HIV positive/ disease.  We need to be concerned about other causes  ? COLONOSCOPY  01/19/2011  ? RMR: Internal and external hemorrhoids, likely source of hematochezia anal papilla, otherwise normal rectal mucosa/ Left-sided diverticula.  Cecal polyp with status post cold snare polypectomy.  Remainder of colonic mucosa appeared normal. Pathology with tubular adenoma. Repeat in 2017  ? COLONOSCOPY WITH PROPOFOL N/A 07/15/2020  ? Procedure: COLONOSCOPY WITH PROPOFOL;  Surgeon: Daneil Dolin, MD;  [facility];  Service: Endoscopy;  Laterality: N/A;  8:15AM ?TCS via Ostomy  ? COLOSTOMY N/A 11/12/2018  ? Procedure: COLOSTOMY;  Surgeon: Virl Cagey, MD;  Location: [facility];  Service: General;  Laterality: N/A;  ? COLOSTOMY REVERSAL N/A 10/02/2020  ? Procedure: COLOSTOMY REVERSAL;  Surgeon: Virl Cagey, MD;  Location: [facility];  Service: General;  Laterality: N/A;  ? ESOPHAGOGASTRODUODENOSCOPY  08/10/2002    ?  NUR: Mild changes of reflux esophagitis limited to gastroesophageal junction  No evidence of ring, stricture, or esophageal candidiasis dysphagia/ Gastritis, possibly H. pylori induced  ? ESOPHAGOGASTRODUODENOSCOPY  12/25/2002  ? NUR: Erosive antral gastritis.  The degree of gastritis is more significant than her last exam/ Normal examination of the esophagus/ Esophageal dilatation performed by passing 24 and [place]  ? ESOPHAGOGASTRODUODENOSCOPY  11/13/2010  ? RMR: Circumferential distal esophageal erosions with soft peptic stricture, consistent with erosive reflux esophagitis or stricture  formation, status post dilation as described above/  Small hiatal hernia/ Tiny antral erosions, otherwise normal stomach D1 and D2  ? ESOPHAGOGASTRODUODENOSCOPY (EGD) WITH PROPOFOL N/A 07/15/2020  ? Procedure: ESOPHAGOGASTRODUODENOSCOPY (EGD) WITH PROPOFOL;  Surgeon: Daneil Dolin, MD;  [facility];  Service: Endoscopy;  Laterality: N/A;  ? FLEXIBLE SIGMOIDOSCOPY N/A 07/15/2020  ? Procedure: FLEXIBLE SIGMOIDOSCOPY;  Surgeon: Daneil Dolin, MD;  [facility];  Service: Endoscopy;  Laterality: N/A;  ? HEMORRHOID SURGERY  09/02/2012  ? Procedure: HEMORRHOIDECTOMY;  Surgeon: Jeannette How  Arnoldo Morale, MD;  [facility];  Service: General;  Laterality: N/A;  ? LAPAROTOMY N/A 11/12/2018  ? Procedure: EXPLORATORY LAPAROTOMY;  Surgeon: Virl Cagey, MD;  [facility]: [facility];  Service: General;  Laterality: N/A;  ? MALONEY DILATION N/A 07/15/2020  ? Procedure: MALONEY DILATION;  Surgeon: Daneil Dolin, MD;  [facility];  Service: Endoscopy;  Laterality: N/A;  ? PARTIAL COLECTOMY N/A 11/12/2018  ? Procedure: PARTIAL COLECTOMY;  Surgeon: Virl Cagey, MD;  [facility];  Service: General;  Laterality: N/A;  ? POLYPECTOMY  07/15/2020  ? Procedure: POLYPECTOMY;  Surgeon: Daneil Dolin, MD;  [facility];  Service: Endoscopy;;  ? TUBAL LIGATION    ? ? ?SH: ?Social History   ? ?Tobacco Use  ? Smoking status: Never  ? Smokeless tobacco: Never  ?Vaping Use  ? Vaping Use: Never used  ?Substance Use Topics  ? Alcohol use: No  ? Drug use: No  ? ? ?ROS: ?Constitutional:  Negative for feve

## 2022-01-21 LAB — URINALYSIS, ROUTINE W REFLEX MICROSCOPIC
Bilirubin, UA: NEGATIVE
Glucose, UA: NEGATIVE
Ketones, UA: NEGATIVE
Nitrite, UA: POSITIVE — AB
RBC, UA: NEGATIVE
Specific Gravity, UA: 1.01 (ref 1.005–1.030)
Urobilinogen, Ur: 0.2 mg/dL (ref 0.2–1.0)
pH, UA: >9 — ABNORMAL HIGH (ref 5.0–7.5)

## 2022-01-21 LAB — MICROSCOPIC EXAMINATION
RBC, Urine: NONE SEEN /hpf (ref 0–2)
WBC, UA: >30 /hpf — ABNORMAL HIGH (ref 0–5)

## 2022-01-21 NOTE — Progress Notes (Signed)
Simple Catheter Placement ? ?Due to urinary retention patient is present today for a foley cath placement.  Patient was cleaned and prepped in a sterile fashion with betadine. A 16 FR foley catheter was inserted, urine return was noted  241m, urine was yellow in color.  The balloon was filled with 10cc of sterile water.  A bed bag was attached for drainage. Patient was also given a night bag to take home and was given instruction on how to change from one bag to another.  Patient was given instruction on proper catheter care.  Patient tolerated well, no complications were noted  ? ?Performed by: KLevi Aland CMA ? ?Additional notes/ Follow up: [place] as scheduled.  ?

## 2022-01-22 ENCOUNTER — Other Ambulatory Visit: Payer: Self-pay | Admitting: Physician Assistant

## 2022-01-23 ENCOUNTER — Telehealth: Payer: Self-pay

## 2022-01-23 NOTE — Telephone Encounter (Signed)
-----   Message from Reynaldo Minium, [place] sent at 01/22/2022  4:09 PM EDT ----- ?[name], this is the patient that we talked about.  Her MDX culture indicates sensitivity to the cefepime that I have ordered at the nursing home, but it is dose dependent.  Will you please have Dr. Alyson Ingles look this over and see if he agrees with her current dose of cefepime and or would he rather change to one of the sensitive antibiotics.  The patient is currently receiving IM dosing and does not have a saline lock.  Thanks so much ?----- Message ----- ?From: Jennette Bill ?Sent: 01/22/2022   3:52 PM EDT ?To: Berneice Heinrich [place], PA-C ? ? ?

## 2022-01-23 NOTE — Telephone Encounter (Signed)
Dr. Alyson Ingles agrees with the dosage. ?

## 2022-01-26 ENCOUNTER — Other Ambulatory Visit: Payer: Self-pay

## 2022-01-26 ENCOUNTER — Encounter: Payer: Self-pay | Admitting: Infectious Disease

## 2022-01-26 ENCOUNTER — Ambulatory Visit (INDEPENDENT_AMBULATORY_CARE_PROVIDER_SITE_OTHER): Payer: Medicare Other | Admitting: Infectious Disease

## 2022-01-26 VITALS — BP 118/68 | HR 51 | Temp 98.6°F

## 2022-01-26 DIAGNOSIS — R634 Abnormal weight loss: Secondary | ICD-10-CM

## 2022-01-26 DIAGNOSIS — N1831 Chronic kidney disease, stage 3a: Secondary | ICD-10-CM | POA: Diagnosis not present

## 2022-01-26 DIAGNOSIS — Z87440 Personal history of urinary (tract) infections: Secondary | ICD-10-CM | POA: Diagnosis not present

## 2022-01-26 DIAGNOSIS — N3090 Cystitis, unspecified without hematuria: Secondary | ICD-10-CM

## 2022-01-26 DIAGNOSIS — Z21 Asymptomatic human immunodeficiency virus [HIV] infection status: Secondary | ICD-10-CM

## 2022-01-26 DIAGNOSIS — A499 Bacterial infection, unspecified: Secondary | ICD-10-CM

## 2022-01-26 DIAGNOSIS — R42 Dizziness and giddiness: Secondary | ICD-10-CM

## 2022-01-26 DIAGNOSIS — E669 Obesity, unspecified: Secondary | ICD-10-CM

## 2022-01-26 DIAGNOSIS — F0280 Dementia in other diseases classified elsewhere without behavioral disturbance: Secondary | ICD-10-CM

## 2022-01-26 DIAGNOSIS — B20 Human immunodeficiency virus [HIV] disease: Secondary | ICD-10-CM | POA: Diagnosis not present

## 2022-01-26 DIAGNOSIS — R339 Retention of urine, unspecified: Secondary | ICD-10-CM

## 2022-01-26 DIAGNOSIS — N3010 Interstitial cystitis (chronic) without hematuria: Secondary | ICD-10-CM

## 2022-01-26 DIAGNOSIS — Z1612 Extended spectrum beta lactamase (ESBL) resistance: Secondary | ICD-10-CM

## 2022-01-26 MED ORDER — TRIUMEQ 600-50-300 MG PO TABS: 1.0000 | ORAL_TABLET | Freq: Every day | ORAL | 11 refills | Status: DC

## 2022-01-26 NOTE — Progress Notes (Signed)
? ?Subjective:  ?Chief complaint she is concerned about weight loss ? Patient ID: [name], adult    DOB: Nov 25, 1940, 81 y.o.   MRN: [id number] ? ?HPI ? ?[name] is an 81 year old Caucasian female with a past medical history frequent for HIV that has been well controlled on Triumeq but also multiple other comorbid conditions including depression chronic kidney disease ? Dementia, who I saw in the clinic in March when she was having dizziness with orthostasis and dysuria.  She also had had recent cultures positive for amp sensitive Enterococcus faecalis and an ESBL Proteus. ? ?We had admitted the hospital and she was found to be dehydrated and was fluid resuscitated given meropenem for 3 days. ? ?She improved and was discharged back to skilled nursing facility she is concerned that she is not getting TRIUMEQ but it is on her medical form of drugs that she is being administered showed her a picture of it.  She is think she is supposed to be on 2 medications instead of the single tablet regimen she is concerned that her virus is not well controlled but was undetectable when checked in the hospital in March. ? ?She is also concerned by weight loss but she says she has now gone down to the 160s after having been at 200 pounds. ? ?She has an indwelling Foley catheter at present. ? ? ? ?Past Medical History:  ?Diagnosis Date  ? Anxiety   ? Arthritis   ? Bronchitis   ? Bronchitis   ? CKD (chronic kidney disease) stage 3, GFR 30-59 ml/min (HCC) 05/04/2016  ? Complication of anesthesia   ? pt states she had a cardiac arrest during hysterectomy 1985.  Pt had several surgies since then that were uneventful  ? Confusion 01/05/2022  ? COPD (chronic obstructive pulmonary disease) ([place])   ? Dementia ([place]) 01/05/2022  ? Depression   ? Dizziness 01/05/2022  ? Dysuria 03/27/2019  ? Elevated LFTs 05/04/2016  ? History of TIAs 1981  ? left side weakness  ? HIV (human immunodeficiency virus infection) ([place])   ? Hypertension   ? Kidney  disease related to HIV infection ([place])   ? pt states she has to have her creatinine level checked often   ? Low blood pressure 01/05/2022  ? Moderate single current episode of major depressive disorder ([place]) 03/12/2021  ? Nausea 03/03/2017  ? Peripheral neuropathy   ? bilateral feet  ? Seasonal allergies 03/27/2019  ? Sinusitis 05/06/2018  ? UTI (urinary tract infection) 03/03/2017  ? Weight loss 03/03/2017  ? ? ?Past Surgical History:  ?Procedure Laterality Date  ? ABDOMINAL HYSTERECTOMY  1985  ? BACK SURGERY    ? BIOPSY  07/15/2020  ? Procedure: BIOPSY;  Surgeon: Daneil Dolin, MD;  [facility];  Service: Endoscopy;;  ? CATARACT EXTRACTION, BILATERAL    ? CHOLECYSTECTOMY    ? COLONOSCOPY  08/10/2002  ? NUR: Normal colonoscopy except for some hemorrhoids and some erythema at the dentate line  ? COLONOSCOPY  12/10/2006  ? Edwards:Diffuse colitis in the transverse and descending colon/This is not entirely typical of ischemic colitis or her HIV positive/ disease.  We need to be concerned about other causes  ? COLONOSCOPY  01/19/2011  ? RMR: Internal and external hemorrhoids, likely source of hematochezia anal papilla, otherwise normal rectal mucosa/ Left-sided diverticula.  Cecal polyp with status post cold snare polypectomy.  Remainder of colonic mucosa appeared normal. Pathology with tubular adenoma. Repeat in 2017  ? COLONOSCOPY  WITH PROPOFOL N/A 07/15/2020  ? Procedure: COLONOSCOPY WITH PROPOFOL;  Surgeon: Daneil Dolin, MD;  [facility];  Service: Endoscopy;  Laterality: N/A;  8:15AM ?TCS via Ostomy  ? COLOSTOMY N/A 11/12/2018  ? Procedure: COLOSTOMY;  Surgeon: Virl Cagey, MD;  [facility];  Service: General;  Laterality: N/A;  ? COLOSTOMY REVERSAL N/A 10/02/2020  ? Procedure: COLOSTOMY REVERSAL;  Surgeon: Virl Cagey, MD;  [facility];  Service: General;  Laterality: N/A;  ? ESOPHAGOGASTRODUODENOSCOPY  08/10/2002    ? NUR: Mild changes of reflux esophagitis limited to  gastroesophageal junction  No evidence of ring, stricture, or esophageal candidiasis dysphagia/ Gastritis, possibly H. pylori induced  ? ESOPHAGOGASTRODUODENOSCOPY  12/25/2002  ? NUR: Erosive antral gastritis.  The degree of gastritis is more significant than her last exam/ Normal examination of the esophagus/ Esophageal dilatation performed by passing 66 and [place]  ? ESOPHAGOGASTRODUODENOSCOPY  11/13/2010  ? RMR: Circumferential distal esophageal erosions with soft peptic stricture, consistent with erosive reflux esophagitis or stricture  formation, status post dilation as described above/  Small hiatal hernia/ Tiny antral erosions, otherwise normal stomach D1 and D2  ? ESOPHAGOGASTRODUODENOSCOPY (EGD) WITH PROPOFOL N/A 07/15/2020  ? Procedure: ESOPHAGOGASTRODUODENOSCOPY (EGD) WITH PROPOFOL;  Surgeon: Daneil Dolin, MD;  [facility];  Service: Endoscopy;  Laterality: N/A;  ? FLEXIBLE SIGMOIDOSCOPY N/A 07/15/2020  ? Procedure: FLEXIBLE SIGMOIDOSCOPY;  Surgeon: Daneil Dolin, MD;  [facility];  Service: Endoscopy;  Laterality: N/A;  ? HEMORRHOID SURGERY  09/02/2012  ? Procedure: HEMORRHOIDECTOMY;  Surgeon: Jamesetta So, MD;  [facility];  Service: General;  Laterality: N/A;  ? LAPAROTOMY N/A 11/12/2018  ? Procedure: EXPLORATORY LAPAROTOMY;  Surgeon: Virl Cagey, MD;  [facility];  Service: General;  Laterality: N/A;  ? MALONEY DILATION N/A 07/15/2020  ? Procedure: MALONEY DILATION;  Surgeon: Daneil Dolin, MD;  [facility];  Service: Endoscopy;  Laterality: N/A;  ? PARTIAL COLECTOMY N/A 11/12/2018  ? Procedure: PARTIAL COLECTOMY;  Surgeon: Virl Cagey, MD;  [facility];  Service: General;  Laterality: N/A;  ? POLYPECTOMY  07/15/2020  ? Procedure: POLYPECTOMY;  Surgeon: Daneil Dolin, MD;  [facility];  Service: Endoscopy;;  ? TUBAL LIGATION    ? ? ?Family History  ?Problem Relation Age of Onset  ? Diabetes Mother   ? Diabetes  Brother   ? Colon cancer Brother   ?     Diagnosed > age 21  ? ? ?  ?Social History  ? ?Socioeconomic History  ? Marital status: Widowed  ?  Spouse name: Not on file  ? Number of children: 3  ? Years of education: Not on file  ? Highest education level: Not on file  ?Occupational History  ? Occupation: retired  ?  Employer: RETIRED  ?Tobacco Use  ? Smoking status: Never  ? Smokeless tobacco: Never  ?Vaping Use  ? Vaping Use: Never used  ?Substance and Sexual Activity  ? Alcohol use: No  ? Drug use: No  ? Sexual activity: Not Currently  ?  Comment: declined condoms  ?Other Topics Concern  ? Not on file  ?Social History Narrative  ? Not on file  ? ?Social Determinants of Health  ? ?Financial Resource Strain: Not on file  ?Food Insecurity: Not on file  ?Transportation Needs: Not on file  ?Physical Activity: Not on file  ?Stress: Not on file  ?Social Connections:  Not on file  ? ? ?Allergies  ?Allergen Reactions  ? Bee Venom Anaphylaxis  ? Promethazine Swelling  ? Tenofovir Disoproxil Fumarate [Tenofovir Disoproxil Fumarate] Other (See Comments)  ?  Renal failure, (08/27/2006)  ? Compazine  [Prochlorperazine Edisylate] Nausea And Vomiting  ? Haloperidol Lactate Other (See Comments)  ?  Reaction is muscle tension, causes severe spasms in face and neck. Forces eyes to roll in the back of the head  ? Sulfa Antibiotics Diarrhea  ? Brompheniramine-Acetaminophen Other (See Comments)  ?  unknown  ? Chlorcyclizine Other (See Comments)  ?  Unknown  ? Chlorpromazine Other (See Comments)  ?  Unknown  ? Codeine Itching and Nausea And Vomiting  ? Navane [Thiothixene] Other (See Comments) and Nausea And Vomiting  ?  Same muscle spasm reaction as haldol  ? Prednisone Other (See Comments)  ?  Brief mild psychosis and agitation  ? Promethazine Hcl Other (See Comments)  ? Propoxyphene N-Acetaminophen Itching and Nausea And Vomiting  ? Morphine Itching and Swelling  ? ? ? ?Current Outpatient Medications:  ?  abacavir-dolutegravir-lamiVUDine  (TRIUMEQ) 600-50-300 MG tablet, Take 1 tablet by mouth daily., Disp: 30 tablet, Rfl: 11 ?  ALLERGY RELIEF 10 MG tablet, Take 10 mg by mouth daily., Disp: , Rfl:  ?  ARIPiprazole (ABILIFY) 5 MG tablet, Take 10 m

## 2022-02-03 ENCOUNTER — Encounter: Payer: Self-pay | Admitting: Cardiology

## 2022-02-03 ENCOUNTER — Ambulatory Visit (INDEPENDENT_AMBULATORY_CARE_PROVIDER_SITE_OTHER): Payer: Medicare Other | Admitting: Cardiology

## 2022-02-03 VITALS — BP 100/55 | HR 60 | Ht 62.0 in | Wt 161.2 lb

## 2022-02-03 DIAGNOSIS — I4891 Unspecified atrial fibrillation: Secondary | ICD-10-CM

## 2022-02-03 DIAGNOSIS — I10 Essential (primary) hypertension: Secondary | ICD-10-CM | POA: Diagnosis not present

## 2022-02-03 DIAGNOSIS — I9789 Other postprocedural complications and disorders of the circulatory system, not elsewhere classified: Secondary | ICD-10-CM

## 2022-02-03 NOTE — Progress Notes (Signed)
? ? ? ?Clinical Summary ?[name] is a 81 y.o.adult ? ?seen today for follow up of the following medical problems.  ?  ?1. Afib, postoperative ?- new diagnosis during Jan 2020 admission ?- occurred postop after abdominal surgery ?- short duration of afib <6 hrs. Was not committed to anticoag given isolated episode postop. CHADS2Vasc score is 34 (age x2, gender, TIA x 2, HTN. ?  ? - no recent palpitations.  ?  ?2. HTN ?-bp's at nursing home 100s/60s ?- can have some lightheadedness with standing.  ? ?  ?  ? 3. UTI ?- recent admission 12/2021 with UTI ?- low bp's, bp meds initially held ?Past Medical History:  ?Diagnosis Date  ? Anxiety   ? Arthritis   ? Bronchitis   ? Bronchitis   ? CKD (chronic kidney disease) stage 3, GFR 30-59 ml/min (HCC) 05/04/2016  ? Complication of anesthesia   ? pt states she had a cardiac arrest during hysterectomy 1985.  Pt had several surgies since then that were uneventful  ? Confusion 01/05/2022  ? COPD (chronic obstructive pulmonary disease) ([place])   ? Dementia ([place]) 01/05/2022  ? Depression   ? Dizziness 01/05/2022  ? Dysuria 03/27/2019  ? Elevated LFTs 05/04/2016  ? History of TIAs 1981  ? left side weakness  ? HIV (human immunodeficiency virus infection) ([place])   ? Hypertension   ? Kidney disease related to HIV infection ([place])   ? pt states she has to have her creatinine level checked often   ? Low blood pressure 01/05/2022  ? Moderate single current episode of major depressive disorder ([place]) 03/12/2021  ? Nausea 03/03/2017  ? Peripheral neuropathy   ? bilateral feet  ? Seasonal allergies 03/27/2019  ? Sinusitis 05/06/2018  ? UTI (urinary tract infection) 03/03/2017  ? Weight loss 03/03/2017  ? ? ? ?Allergies  ?Allergen Reactions  ? Bee Venom Anaphylaxis  ? Promethazine Swelling  ? Tenofovir Disoproxil Fumarate [Tenofovir Disoproxil Fumarate] Other (See Comments)  ?  Renal failure, (08/27/2006)  ? Compazine  [Prochlorperazine Edisylate] Nausea And Vomiting  ? Haloperidol Lactate Other (See Comments)  ?   Reaction is muscle tension, causes severe spasms in face and neck. Forces eyes to roll in the back of the head  ? Sulfa Antibiotics Diarrhea  ? Brompheniramine-Acetaminophen Other (See Comments)  ?  unknown  ? Chlorcyclizine Other (See Comments)  ?  Unknown  ? Chlorpromazine Other (See Comments)  ?  Unknown  ? Codeine Itching and Nausea And Vomiting  ? Navane [Thiothixene] Other (See Comments) and Nausea And Vomiting  ?  Same muscle spasm reaction as haldol  ? Prednisone Other (See Comments)  ?  Brief mild psychosis and agitation  ? Promethazine Hcl Other (See Comments)  ? Propoxyphene N-Acetaminophen Itching and Nausea And Vomiting  ? Morphine Itching and Swelling  ? ? ? ?Current Outpatient Medications  ?Medication Sig Dispense Refill  ? abacavir-dolutegravir-lamiVUDine (TRIUMEQ) 600-50-300 MG tablet Take 1 tablet by mouth daily. 30 tablet 11  ? ALLERGY RELIEF 10 MG tablet Take 10 mg by mouth daily.    ? ARIPiprazole (ABILIFY) 5 MG tablet Take 10 mg by mouth daily.    ? atorvastatin (LIPITOR) 20 MG tablet Take 20 mg by mouth daily.    ? clonazePAM (KLONOPIN) 1 MG tablet Take 1 tablet (1 mg total) by mouth 2 (two) times daily as needed for up to 3 days for anxiety. (Patient taking differently: Take 1 mg by mouth 2 (two) times daily as  needed for anxiety. For 14 days) 6 tablet 0  ? Cranberry 250 MG TABS Take 250 mg by mouth every morning.    ? docusate sodium (COLACE) 100 MG capsule Take 1 capsule (100 mg total) by mouth 2 (two) times daily. 10 capsule 0  ? dolutegravir (TIVICAY) 50 MG tablet Take 50 mg by mouth daily.    ? DULoxetine (CYMBALTA) 60 MG capsule Take 60 mg by mouth daily.    ? feeding supplement (ENSURE ENLIVE / ENSURE PLUS) LIQD Take 237 mLs by mouth 2 (two) times daily between meals. 237 mL 12  ? fluticasone (FLONASE) 50 MCG/ACT nasal spray 2 SPRAYS INTO BOTH NOSTRILS AT BEDTIME. (Patient taking differently: Place 2 sprays into both nostrils at bedtime.) 16 g 5  ? furosemide (LASIX) 40 MG tablet Take  1 tablet (40 mg total) by mouth daily as needed for fluid. Hold until follow up with PCP (Patient taking differently: Take 40 mg by mouth daily as needed for fluid.)    ? losartan (COZAAR) 25 MG tablet Take 12.5 mg by mouth daily.    ? Menthol-Zinc Oxide 0.44-20.6 % OINT Apply 1 application. topically every 8 (eight) hours as needed (protection). Apply to buttocks    ? Multiple Vitamin (ONE-A-DAY 55 PLUS PO) Take 1 tablet by mouth daily.    ? oxymetazoline (AFRIN) 0.05 % nasal spray Place 1 spray into both nostrils every 12 (twelve) hours as needed for congestion (Nose Bleeds).    ? pantoprazole (PROTONIX) 40 MG tablet Take 1 tablet (40 mg total) by mouth daily before breakfast. 30 tablet 5  ? polyethylene glycol (MIRALAX) 17 g packet Take 17 g by mouth daily. Hold for diarrhea    ? polyvinyl alcohol (LIQUIFILM TEARS) 1.4 % ophthalmic solution Place 1 drop into both eyes daily.    ? thiamine (VITAMIN B-1) 100 MG tablet Take 100 mg by mouth daily.    ? Magnesium 100 MG TABS Take 50 mg by mouth daily. (Patient not taking: Reported on 02/03/2022)    ? ?No current facility-administered medications for this visit.  ? ? ? ?Past Surgical History:  ?Procedure Laterality Date  ? ABDOMINAL HYSTERECTOMY  1985  ? BACK SURGERY    ? BIOPSY  07/15/2020  ? Procedure: BIOPSY;  Surgeon: Daneil Dolin, MD;  [facility];  Service: Endoscopy;;  ? CATARACT EXTRACTION, BILATERAL    ? CHOLECYSTECTOMY    ? COLONOSCOPY  08/10/2002  ? NUR: Normal colonoscopy except for some hemorrhoids and some erythema at the dentate line  ? COLONOSCOPY  12/10/2006  ? Edwards:Diffuse colitis in the transverse and descending colon/This is not entirely typical of ischemic colitis or her HIV positive/ disease.  We need to be concerned about other causes  ? COLONOSCOPY  01/19/2011  ? RMR: Internal and external hemorrhoids, likely source of hematochezia anal papilla, otherwise normal rectal mucosa/ Left-sided diverticula.  Cecal polyp with status post  cold snare polypectomy.  Remainder of colonic mucosa appeared normal. Pathology with tubular adenoma. Repeat in 2017  ? COLONOSCOPY WITH PROPOFOL N/A 07/15/2020  ? Procedure: COLONOSCOPY WITH PROPOFOL;  Surgeon: Daneil Dolin, MD;  [facility];  Service: Endoscopy;  Laterality: N/A;  8:15AM ?TCS via Ostomy  ? COLOSTOMY N/A 11/12/2018  ? Procedure: COLOSTOMY;  Surgeon: Virl Cagey, MD;  Location: [facility];  Service: General;  Laterality: N/A;  ? COLOSTOMY REVERSAL N/A 10/02/2020  ? Procedure: COLOSTOMY REVERSAL;  Surgeon: Virl Cagey, MD;  Location: [facility];  Service: General;  Laterality: N/A;  ? ESOPHAGOGASTRODUODENOSCOPY  08/10/2002    ? NUR: Mild changes of reflux esophagitis limited to gastroesophageal junction  No evidence of ring, stricture, or esophageal candidiasis dysphagia/ Gastritis, possibly H. pylori induced  ? ESOPHAGOGASTRODUODENOSCOPY  12/25/2002  ? NUR: Erosive antral gastritis.  The degree of gastritis is more significant than her last exam/ Normal examination of the esophagus/ Esophageal dilatation performed by passing 73 and [place]  ? ESOPHAGOGASTRODUODENOSCOPY  11/13/2010  ? RMR: Circumferential distal esophageal erosions with soft peptic stricture, consistent with erosive reflux esophagitis or stricture  formation, status post dilation as described above/  Small hiatal hernia/ Tiny antral erosions, otherwise normal stomach D1 and D2  ? ESOPHAGOGASTRODUODENOSCOPY (EGD) WITH PROPOFOL N/A 07/15/2020  ? Procedure: ESOPHAGOGASTRODUODENOSCOPY (EGD) WITH PROPOFOL;  Surgeon: Daneil Dolin, MD;  [facility];  Service: Endoscopy;  Laterality: N/A;  ? FLEXIBLE SIGMOIDOSCOPY N/A 07/15/2020  ? Procedure: FLEXIBLE SIGMOIDOSCOPY;  Surgeon: Daneil Dolin, MD;  [facility];  Service: Endoscopy;  Laterality: N/A;  ? HEMORRHOID SURGERY  09/02/2012  ? Procedure: HEMORRHOIDECTOMY;  Surgeon: Jamesetta So, MD;  [facility];  Service: General;   Laterality: N/A;  ? LAPAROTOMY N/A 11/12/2018  ? Procedure: EXPLORATORY LAPAROTOMY;  Surgeon: Virl Cagey, MD;  [facility];  Service: General;  Laterality: N/A;  ? MALONEY DILATION N/A 07/15/2020

## 2022-02-03 NOTE — Patient Instructions (Signed)
Medication Instructions:  ?Stop Labetalol.  ?Continue all other medications.    ? ?Labwork: ?none ? ?Testing/Procedures: ?none ? ?Follow-Up: ?Your physician wants you to follow up in:  1 year.  You should receive a call from the office when due.  If you don't receive this call, please call our office to schedule the follow up appointment.   ? ?Any Other Special Instructions Will Be Listed Below (If Applicable). ? ? ?If you need a refill on your cardiac medications before your next appointment, please call your pharmacy. ? ?

## 2022-05-25 ENCOUNTER — Other Ambulatory Visit: Payer: Self-pay

## 2022-05-25 ENCOUNTER — Encounter: Payer: Self-pay | Admitting: Infectious Disease

## 2022-05-25 ENCOUNTER — Ambulatory Visit (INDEPENDENT_AMBULATORY_CARE_PROVIDER_SITE_OTHER): Payer: Medicare Other | Admitting: Infectious Disease

## 2022-05-25 VITALS — BP 131/83 | HR 88 | Temp 97.3°F

## 2022-05-25 DIAGNOSIS — Z21 Asymptomatic human immunodeficiency virus [HIV] infection status: Secondary | ICD-10-CM

## 2022-05-25 DIAGNOSIS — R339 Retention of urine, unspecified: Secondary | ICD-10-CM

## 2022-05-25 DIAGNOSIS — G969 Disorder of central nervous system, unspecified: Secondary | ICD-10-CM

## 2022-05-25 DIAGNOSIS — Z113 Encounter for screening for infections with a predominantly sexual mode of transmission: Secondary | ICD-10-CM | POA: Diagnosis not present

## 2022-05-25 DIAGNOSIS — B20 Human immunodeficiency virus [HIV] disease: Secondary | ICD-10-CM | POA: Diagnosis not present

## 2022-05-25 DIAGNOSIS — R102 Pelvic and perineal pain: Secondary | ICD-10-CM | POA: Insufficient documentation

## 2022-05-25 HISTORY — DX: Human immunodeficiency virus (HIV) disease: B20

## 2022-05-25 HISTORY — DX: Pelvic and perineal pain: R10.2

## 2022-05-25 MED ORDER — BIKTARVY 50-200-25 MG PO TABS: 1.0000 | ORAL_TABLET | Freq: Every day | ORAL | 11 refills | Status: AC

## 2022-05-25 MED ORDER — TRIUMEQ 600-50-300 MG PO TABS: 1.0000 | ORAL_TABLET | Freq: Every day | ORAL | 11 refills | Status: DC

## 2022-05-25 NOTE — Progress Notes (Signed)
Subjective:  Chief complaint pelvic pain  Patient ID: [name], adult    DOB: 1941-05-18, 81 y.o.   MRN: [id number]  HPI  [name] is an 81 year-old Caucasian female with a past medical history frequent for HIV that has been well controlled on Triumeq but also multiple other comorbid conditions including depression chronic kidney disease do believe dementia now.  She is complaining of severe pelvic pain that she says the providers in the facility will not do anything about she is also worried that her kidneys are not working and that she is not receiving antiretrovirals she does appear to be receiving both TRIUMEQ and [place] which is redundant therapy.  I will switch her over to [facility] if the facility will accommodate this.      Past Medical History:  Diagnosis Date   Anxiety    Arthritis    Bronchitis    Bronchitis    CKD (chronic kidney disease) stage 3, GFR 30-59 ml/min ([place]) 05/01/8674   Complication of anesthesia    pt states she had a cardiac arrest during hysterectomy 1985.  Pt had several surgies since then that were uneventful   Confusion 01/05/2022   COPD (chronic obstructive pulmonary disease) ([place])    Dementia ([name]) 01/05/2022   Depression    Dizziness 01/05/2022   Dysuria 03/27/2019   Elevated LFTs 05/04/2016   History of TIAs 1981   left side weakness   HIV (human immunodeficiency virus infection) ([place])    Hypertension    Kidney disease related to HIV infection ([place])    pt states she has to have her creatinine level checked often    Low blood pressure 01/05/2022   Moderate single current episode of major depressive disorder ([place]) 03/12/2021   Nausea 03/03/2017   Peripheral neuropathy    bilateral feet   Seasonal allergies 03/27/2019   Sinusitis 05/06/2018   UTI (urinary tract infection) 03/03/2017   Weight loss 03/03/2017    Past Surgical History:  Procedure Laterality Date   ABDOMINAL HYSTERECTOMY  1985   BACK SURGERY     BIOPSY  07/15/2020   Procedure:  BIOPSY;  Surgeon: Daneil Dolin, MD;  [facility];  Service: Endoscopy;;   CATARACT EXTRACTION, BILATERAL     CHOLECYSTECTOMY     COLONOSCOPY  08/10/2002   NUR: Normal colonoscopy except for some hemorrhoids and some erythema at the dentate line   COLONOSCOPY  12/10/2006   Edwards:Diffuse colitis in the transverse and descending colon/This is not entirely typical of ischemic colitis or her HIV positive/ disease.  We need to be concerned about other causes   COLONOSCOPY  01/19/2011   RMR: Internal and external hemorrhoids, likely source of hematochezia anal papilla, otherwise normal rectal mucosa/ Left-sided diverticula.  Cecal polyp with status post cold snare polypectomy.  Remainder of colonic mucosa appeared normal. Pathology with tubular adenoma. Repeat in 2017   COLONOSCOPY WITH PROPOFOL N/A 07/15/2020   Procedure: COLONOSCOPY WITH PROPOFOL;  Surgeon: Daneil Dolin, MD;  [facility];  Service: Endoscopy;  Laterality: N/A;  8:15AM TCS via Ostomy   COLOSTOMY N/A 11/12/2018   Procedure: COLOSTOMY;  Surgeon: Virl Cagey, MD;  [facility];  Service: General;  Laterality: N/A;   COLOSTOMY REVERSAL N/A 10/02/2020   Procedure: COLOSTOMY REVERSAL;  Surgeon: Virl Cagey, MD;  [facility];  Service: General;  Laterality: N/A;   ESOPHAGOGASTRODUODENOSCOPY  08/10/2002     NUR: Mild changes of reflux esophagitis limited to  gastroesophageal junction  No evidence of ring, stricture, or esophageal candidiasis dysphagia/ Gastritis, possibly H. pylori induced   ESOPHAGOGASTRODUODENOSCOPY  12/25/2002   NUR: Erosive antral gastritis.  The degree of gastritis is more significant than her last exam/ Normal examination of the esophagus/ Esophageal dilatation performed by passing 34 and [address] [place]   ESOPHAGOGASTRODUODENOSCOPY  11/13/2010   RMR: Circumferential distal esophageal erosions with soft peptic stricture, consistent with erosive reflux esophagitis or  stricture  formation, status post dilation as described above/  Small hiatal hernia/ Tiny antral erosions, otherwise normal stomach D1 and D2   ESOPHAGOGASTRODUODENOSCOPY (EGD) WITH PROPOFOL N/A 07/15/2020   Procedure: ESOPHAGOGASTRODUODENOSCOPY (EGD) WITH PROPOFOL;  Surgeon: Daneil Dolin, MD;  [facility];  Service: Endoscopy;  Laterality: N/A;   FLEXIBLE SIGMOIDOSCOPY N/A 07/15/2020   Procedure: FLEXIBLE SIGMOIDOSCOPY;  Surgeon: Daneil Dolin, MD;  [facility];  Service: Endoscopy;  Laterality: N/A;   HEMORRHOID SURGERY  09/02/2012   Procedure: HEMORRHOIDECTOMY;  Surgeon: Jamesetta So, MD;  Location[facility];  Service: General;  Laterality: N/A;   LAPAROTOMY N/A 11/12/2018   Procedure: EXPLORATORY LAPAROTOMY;  Surgeon: Virl Cagey, MD;  [facility];  Service: General;  Laterality: N/A;   MALONEY DILATION N/A 07/15/2020   Procedure: Keturah Shavers;  Surgeon: Daneil Dolin, MD;  [facility];  Service: Endoscopy;  Laterality: N/A;   PARTIAL COLECTOMY N/A 11/12/2018   Procedure: PARTIAL COLECTOMY;  Surgeon: Virl Cagey, MD;  [facility];  Service: General;  Laterality: N/A;   POLYPECTOMY  07/15/2020   Procedure: POLYPECTOMY;  Surgeon: Daneil Dolin, MD;  [facility];  Service: Endoscopy;;   TUBAL LIGATION      Family History  Problem Relation Age of Onset   Diabetes Mother    Diabetes Brother    Colon cancer Brother        Diagnosed > age 74      Social History   Socioeconomic History   Marital status: Widowed    Spouse name: Not on file   Number of children: 3   Years of education: Not on file   Highest education level: Not on file  Occupational History   Occupation: retired    Fish farm manager: RETIRED  Tobacco Use   Smoking status: Never   Smokeless tobacco: Never  Vaping Use   Vaping Use: Never used  Substance and Sexual Activity   Alcohol use: No   Drug use: No   Sexual activity: Not Currently     Comment: declined condoms  Other Topics Concern   Not on file  Social History Narrative   Not on file   Social Determinants of Health   Financial Resource Strain: Not on file  Food Insecurity: Not on file  Transportation Needs: Not on file  Physical Activity: Not on file  Stress: Not on file  Social Connections: Not on file    Allergies  Allergen Reactions   Bee Venom Anaphylaxis   Promethazine Swelling   Tenofovir Disoproxil Fumarate [Tenofovir Disoproxil Fumarate] Other (See Comments)    Renal failure, (08/27/2006)   Compazine  [Prochlorperazine Edisylate] Nausea And Vomiting   Haloperidol Lactate Other (See Comments)    Reaction is muscle tension, causes severe spasms in face and neck. Forces eyes to roll in the back of the head   Sulfa Antibiotics Diarrhea   Brompheniramine-Acetaminophen Other (See Comments)    unknown   Chlorcyclizine Other (See Comments)    Unknown  Chlorpromazine Other (See Comments)    Unknown   Codeine Itching and Nausea And Vomiting   Navane [Thiothixene] Other (See Comments) and Nausea And Vomiting    Same muscle spasm reaction as haldol   Prednisone Other (See Comments)    Brief mild psychosis and agitation   Promethazine Hcl Other (See Comments)   Propoxyphene N-Acetaminophen Itching and Nausea And Vomiting   Morphine Itching and Swelling     Current Outpatient Medications:    abacavir-dolutegravir-lamiVUDine (TRIUMEQ) 600-50-300 MG tablet, Take 1 tablet by mouth daily., Disp: 30 tablet, Rfl: 11   ALLERGY RELIEF 10 MG tablet, Take 10 mg by mouth daily., Disp: , Rfl:    ARIPiprazole (ABILIFY) 5 MG tablet, Take 10 mg by mouth daily., Disp: , Rfl:    atorvastatin (LIPITOR) 20 MG tablet, Take 20 mg by mouth daily., Disp: , Rfl:    clonazePAM (KLONOPIN) 1 MG tablet, Take 1 tablet (1 mg total) by mouth 2 (two) times daily as needed for up to 3 days for anxiety. (Patient taking differently: Take 1 mg by mouth 2 (two) times daily as needed for  anxiety. For 14 days), Disp: 6 tablet, Rfl: 0   Cranberry 250 MG TABS, Take 250 mg by mouth every morning., Disp: , Rfl:    docusate sodium (COLACE) 100 MG capsule, Take 1 capsule (100 mg total) by mouth 2 (two) times daily., Disp: 10 capsule, Rfl: 0   dolutegravir (TIVICAY) 50 MG tablet, Take 50 mg by mouth daily., Disp: , Rfl:    DULoxetine (CYMBALTA) 60 MG capsule, Take 60 mg by mouth daily., Disp: , Rfl:    feeding supplement (ENSURE ENLIVE / ENSURE PLUS) LIQD, Take 237 mLs by mouth 2 (two) times daily between meals., Disp: 237 mL, Rfl: 12   fluticasone (FLONASE) 50 MCG/ACT nasal spray, 2 SPRAYS INTO BOTH NOSTRILS AT BEDTIME. (Patient taking differently: Place 2 sprays into both nostrils at bedtime.), Disp: 16 g, Rfl: 5   furosemide (LASIX) 40 MG tablet, Take 1 tablet (40 mg total) by mouth daily as needed for fluid. Hold until follow up with PCP (Patient taking differently: Take 40 mg by mouth daily as needed for fluid.), Disp: , Rfl:    losartan (COZAAR) 25 MG tablet, Take 12.5 mg by mouth daily., Disp: , Rfl:    Magnesium 100 MG TABS, Take 50 mg by mouth daily. (Patient not taking: Reported on 02/03/2022), Disp: , Rfl:    Menthol-Zinc Oxide 0.44-20.6 % OINT, Apply 1 application. topically every 8 (eight) hours as needed (protection). Apply to buttocks, Disp: , Rfl:    Multiple Vitamin (ONE-A-DAY 55 PLUS PO), Take 1 tablet by mouth daily., Disp: , Rfl:    oxymetazoline (AFRIN) 0.05 % nasal spray, Place 1 spray into both nostrils every 12 (twelve) hours as needed for congestion (Nose Bleeds)., Disp: , Rfl:    pantoprazole (PROTONIX) 40 MG tablet, Take 1 tablet (40 mg total) by mouth daily before breakfast., Disp: 30 tablet, Rfl: 5   polyethylene glycol (MIRALAX) 17 g packet, Take 17 g by mouth daily. Hold for diarrhea, Disp: , Rfl:    polyvinyl alcohol (LIQUIFILM TEARS) 1.4 % ophthalmic solution, Place 1 drop into both eyes daily., Disp: , Rfl:    thiamine (VITAMIN B-1) 100 MG tablet, Take 100 mg  by mouth daily., Disp: , Rfl:    Review of Systems  Unable to perform ROS: Dementia  Constitutional:  Negative for activity change, appetite change, chills, diaphoresis, fatigue, fever and unexpected weight change.  HENT:  Negative for congestion, rhinorrhea, sinus pressure, sneezing, sore throat and trouble swallowing.   Eyes:  Negative for photophobia and visual disturbance.  Respiratory:  Negative for cough, chest tightness, shortness of breath, wheezing and stridor.   Cardiovascular:  Negative for chest pain, palpitations and leg swelling.  Gastrointestinal:  Negative for abdominal distention, abdominal pain, anal bleeding, blood in stool, constipation, diarrhea, nausea and vomiting.  Genitourinary:  Positive for pelvic pain. Negative for difficulty urinating, dysuria, flank pain and hematuria.  Musculoskeletal:  Negative for arthralgias, back pain, gait problem, joint swelling and myalgias.  Skin:  Negative for color change, pallor, rash and wound.  Neurological:  Negative for dizziness, tremors, weakness and light-headedness.  Hematological:  Negative for adenopathy. Does not bruise/bleed easily.  Psychiatric/Behavioral:  Positive for dysphoric mood. Negative for agitation, behavioral problems, confusion, decreased concentration and sleep disturbance.        Objective:   Physical Exam Constitutional:      Appearance: She is well-developed.  HENT:     Head: Normocephalic and atraumatic.  Eyes:     Conjunctiva/sclera: Conjunctivae normal.  Cardiovascular:     Rate and Rhythm: Normal rate and regular rhythm.  Pulmonary:     Effort: Pulmonary effort is normal. No respiratory distress.     Breath sounds: No wheezing.  Abdominal:     General: There is no distension.     Palpations: Abdomen is soft.  Musculoskeletal:        General: No tenderness. Normal range of motion.     Cervical back: Normal range of motion and neck supple.  Skin:    General: Skin is warm and dry.      Coloration: Skin is not pale.     Findings: No erythema or rash.  Neurological:     General: No focal deficit present.     Mental Status: She is alert and oriented to person, place, and time.  Psychiatric:        Mood and Affect: Mood is depressed. Affect is tearful.        Behavior: Behavior is cooperative.        Thought Content: Thought content normal.        Cognition and Memory: Memory is impaired. She exhibits impaired recent memory.        Judgment: Judgment normal.           Assessment & Plan:  HIV disease:  I will check a viral load CD4 count CBC CMP  I will switch her over to [facility] and have her follow-up with me in September.  Then she does not need to be on both TIVICAY and [place] but hopefully if we switch her to [facility] this will clarify the fact she just needs to be on 1 pill once a day of a single tablet complete regimen.    Chronic kidney disease I will check a CMP  Pelvic pain: Attempted to check a urine analysis and culture if not facility should check 1.  Question of urinary retention would recommend bladder scan and postvoid residual scan.  Mention could be partly associate with HIV although her virus itself is well controlled.

## 2022-05-26 ENCOUNTER — Telehealth: Payer: Self-pay

## 2022-05-26 LAB — T-HELPER CELLS (CD4) COUNT (NOT AT ARMC)
CD4 % Helper T Cell: 22 % — ABNORMAL LOW (ref 33–65)
CD4 T Cell Abs: 571 /uL (ref 400–1790)

## 2022-05-26 NOTE — Telephone Encounter (Signed)
Received call from patient's facility wanting to confirm dosage for Biktarvy. Relayed that is should be Biktarvy 50-200-'25mg'$  tablets.   Beryle Flock, RN

## 2022-05-26 NOTE — Telephone Encounter (Signed)
Received call from facility stating they do not preform post residual voids. Order for U/A pending collection at this time. Routing to provider to make aware.  [name]

## 2022-05-27 ENCOUNTER — Telehealth: Payer: Self-pay

## 2022-05-27 NOTE — Telephone Encounter (Signed)
[place] at [facility] in [place] called stating that [employer] noted Tenofovir as an allergy when trying to fill Biktarvy. Requesting provider or pharmacist to contact to give verbal okay for medication to be filled. [place] [phone].   [place] [phone]

## 2022-05-27 NOTE — Telephone Encounter (Signed)
Multiple attempts made to reach nursing staff at [facility] unable to do so at this time. Letter faxed to [facility] requesting that facility set up appointment with patient's urologist for further evaluation of her urinary complaints.   Fax: [phone]   Beryle Flock, RN

## 2022-05-27 NOTE — Telephone Encounter (Signed)
Received call from [facility] with [name] wanting to clarify order for [name]. Patient has tenofovir listed as allergy and pharmacy needs approval from provider before filling.   [place] P: [phone]  Beryle Flock, RN

## 2022-05-27 NOTE — Telephone Encounter (Signed)
Tried to call [employer] but could not get through to a pharmacy staff member. Tried to speak with [place] at [facility], but phone line kept ringing busy. Spoke with another staff member named Denton Ar who said she would relay the message to [place]. Patient's chart notes to avoid TDF as she experienced renal failure in 2007; reviewed that there is no concern for continuing [place].   Alfonse Spruce, PharmD, CPP, [place] Clinical Pharmacist Practitioner [facility]

## 2022-05-28 ENCOUNTER — Telehealth: Payer: Self-pay

## 2022-05-28 LAB — COMPLETE METABOLIC PANEL WITH GFR
AG Ratio: 1.7 (calc) (ref 1.0–2.5)
ALT: 11 U/L (ref 6–29)
AST: 15 U/L (ref 10–35)
Albumin: 4.4 g/dL (ref 3.6–5.1)
Alkaline phosphatase (APISO): 108 U/L (ref 37–153)
BUN/Creatinine Ratio: 14 (calc) (ref 6–22)
BUN: 22 mg/dL (ref 7–25)
CO2: 27 mmol/L (ref 20–32)
Calcium: 11.3 mg/dL — ABNORMAL HIGH (ref 8.6–10.4)
Chloride: 104 mmol/L (ref 98–110)
Creat: 1.53 mg/dL — ABNORMAL HIGH (ref 0.60–0.95)
Globulin: 2.6 g/dL (calc) (ref 1.9–3.7)
Glucose, Bld: 94 mg/dL (ref 65–99)
Potassium: 3.9 mmol/L (ref 3.5–5.3)
Sodium: 140 mmol/L (ref 135–146)
Total Bilirubin: 1.1 mg/dL (ref 0.2–1.2)
Total Protein: 7 g/dL (ref 6.1–8.1)
eGFR: 34 mL/min/{1.73_m2} — ABNORMAL LOW (ref >=60)

## 2022-05-28 LAB — RPR: RPR Ser Ql: NONREACTIVE

## 2022-05-28 LAB — CBC WITH DIFFERENTIAL/PLATELET
Absolute Monocytes: 707 cells/uL (ref 200–950)
Basophils Absolute: 52 cells/uL (ref 0–200)
Basophils Relative: 0.5 %
Eosinophils Absolute: 125 cells/uL (ref 15–500)
Eosinophils Relative: 1.2 %
HCT: 44.2 % (ref 35.0–45.0)
Hemoglobin: 15.8 g/dL — ABNORMAL HIGH (ref 11.7–15.5)
Lymphs Abs: 2943 cells/uL (ref 850–3900)
MCH: 35.3 pg — ABNORMAL HIGH (ref 27.0–33.0)
MCHC: 35.7 g/dL (ref 32.0–36.0)
MCV: 98.9 fL (ref 80.0–100.0)
MPV: 9.8 fL (ref 7.5–12.5)
Monocytes Relative: 6.8 %
Neutro Abs: 6573 cells/uL (ref 1500–7800)
Neutrophils Relative %: 63.2 %
Platelets: 300 10*3/uL (ref 140–400)
RBC: 4.47 10*6/uL (ref 3.80–5.10)
RDW: 12.5 % (ref 11.0–15.0)
Total Lymphocyte: 28.3 %
WBC: 10.4 10*3/uL (ref 3.8–10.8)

## 2022-05-28 LAB — LIPID PANEL
Cholesterol: 175 mg/dL (ref <200)
HDL: 37 mg/dL — ABNORMAL LOW (ref >=50)
LDL Cholesterol (Calc): 105 mg/dL (calc) — ABNORMAL HIGH
Non-HDL Cholesterol (Calc): 138 mg/dL (calc) — ABNORMAL HIGH (ref <130)
Total CHOL/HDL Ratio: 4.7 (calc) (ref <5.0)
Triglycerides: 209 mg/dL — ABNORMAL HIGH (ref <150)

## 2022-05-28 LAB — HIV-1 RNA QUANT-NO REFLEX-BLD
HIV 1 RNA Quant: NOT DETECTED Copies/mL
HIV-1 RNA Quant, Log: NOT DETECTED Log cps/mL

## 2022-05-28 NOTE — Telephone Encounter (Signed)
She can come see Sharee Pimple on the 14h. Since she is in skilled facility she can see the in house doctor if needed before then. Thanks!

## 2022-05-28 NOTE — Telephone Encounter (Signed)
Thank you :)

## 2022-05-28 NOTE — Telephone Encounter (Signed)
Please follow up with patient and advise. Thanks

## 2022-05-28 NOTE — Telephone Encounter (Signed)
Spoke with pharmacist at [employer] who confirmed [place] from [facility] had already faxed information over yesterday confirming it was ok to fill [place] after I spoke with her. Thanks, Estill Bamberg

## 2022-05-28 NOTE — Telephone Encounter (Signed)
Facility called and the unit nurse advised that patient is complaining of dysuria and urinary retention. Patient usually sees Management consultant. I wanted to verify since he is going on vacation and is full tomorrow a time and date that would work for his schedule. I can schedule once he comes back since patient is in a facility if that is what is needed.    Thank you

## 2022-05-28 NOTE — Telephone Encounter (Signed)
[facility] was notified yesterday that patient could take [employer].  [facility] is also aware that there is no concern for continuing Biktarvy and the medication will be shipped to [facility].    [place], CMA

## 2022-06-03 ENCOUNTER — Telehealth: Payer: Self-pay

## 2022-06-03 NOTE — Telephone Encounter (Signed)
[name] a nurse from [facility] called advising patient needed to be seen for urinary retention. Patient was seen by infectious disease for a follow up and they advised she needed a PVR which the facility advised they were not equipped to perform. Since Dr. Judge Stall is on vacation and Sharee Pimple is out will another provider be willing to see patient?

## 2022-06-03 NOTE — Telephone Encounter (Signed)
Return a call to  [facility] regarding [name] having urinary retention and left voice message on [employer] informing her to give our office a call back. Will try back at a later time.

## 2022-06-03 NOTE — Telephone Encounter (Signed)
Please advised, Patient in a skill facility and can only do in and out catheter.

## 2022-06-03 NOTE — Telephone Encounter (Signed)
Tried calling [place] [facility] to speak with nurse with no answer

## 2022-06-03 NOTE — Telephone Encounter (Signed)
Open in error

## 2022-06-05 ENCOUNTER — Telehealth: Payer: Self-pay

## 2022-06-05 NOTE — Telephone Encounter (Signed)
Spoke with Mitzi Hansen, Ms. Beneke nurse on duty and advise him to do a in and out catheter to obtain U/A along with a culture, and also measure what is in the bladder. Nurse Mitzi Hansen voiced understanding.

## 2022-06-05 NOTE — Telephone Encounter (Signed)
Spoke with Mitzi Hansen, [place]. [place] nurse on duty and made him aware per Dr. Felipa Eth to do a In and out catheterization, please have them do this to obtain a urine specimen for U/A and culture as well as measure amount in bladder. Nurse Mitzi Hansen voice understanding from [facility].

## 2022-06-10 ENCOUNTER — Other Ambulatory Visit: Payer: Self-pay

## 2022-07-07 NOTE — Progress Notes (Deleted)
Subjective:  Chief complaint :  Patient ID: [name], adult    DOB: [birth year], 81 y.o.   MRN: [id number]  HPI  [name] is an 81 year-old Caucasian female with a past medical history frequent for HIV that has been well controlled on Triumeq but also multiple other comorbid conditions including depression chronic kidney disease do believe dementia now.  She is complaining of severe pelvic pain that she says the providers in the facility will not do anything about she is also worried that her kidneys are not working and that she is not receiving antiretrovirals she does appear to be receiving both TRIUMEQ and [place] which is redundant therapy.  I tried to switch her over to [facility] if the facility will accommodate this. At her last visit she was perseverating about pelvic pain. She appears to have developed some form of dementia--likely multifactorial.      Past Medical History:  Diagnosis Date   Anxiety    Arthritis    Bronchitis    Bronchitis    CKD (chronic kidney disease) stage 3, GFR 30-59 ml/min ([place]) 02/02/7352   Complication of anesthesia    pt states she had a cardiac arrest during hysterectomy 1985.  Pt had several surgies since then that were uneventful   Confusion 01/05/2022   COPD (chronic obstructive pulmonary disease) ([place])    Dementia ([place]) 01/05/2022   Depression    Dizziness 01/05/2022   Dysuria 03/27/2019   Elevated LFTs 05/04/2016   History of TIAs 1981   left side weakness   HIV (human immunodeficiency virus infection) ([place])    HIV infection with neurological disease ([place]) 05/25/2022   Hypertension    Kidney disease related to HIV infection ([place])    pt states she has to have her creatinine level checked often    Low blood pressure 01/05/2022   Moderate single current episode of major depressive disorder ([place]) 03/12/2021   Nausea 03/03/2017   Pelvic pain 05/25/2022   Peripheral neuropathy    bilateral feet   Seasonal allergies 03/27/2019   Sinusitis 05/06/2018    UTI (urinary tract infection) 03/03/2017   Weight loss 03/03/2017    Past Surgical History:  Procedure Laterality Date   ABDOMINAL HYSTERECTOMY  1985   BACK SURGERY     BIOPSY  07/15/2020   Procedure: BIOPSY;  Surgeon: Daneil Dolin, MD;  [facility];  Service: Endoscopy;;   CATARACT EXTRACTION, BILATERAL     CHOLECYSTECTOMY     COLONOSCOPY  08/10/2002   NUR: Normal colonoscopy except for some hemorrhoids and some erythema at the dentate line   COLONOSCOPY  12/10/2006   Edwards:Diffuse colitis in the transverse and descending colon/This is not entirely typical of ischemic colitis or her HIV positive/ disease.  We need to be concerned about other causes   COLONOSCOPY  01/19/2011   RMR: Internal and external hemorrhoids, likely source of hematochezia anal papilla, otherwise normal rectal mucosa/ Left-sided diverticula.  Cecal polyp with status post cold snare polypectomy.  Remainder of colonic mucosa appeared normal. Pathology with tubular adenoma. Repeat in 2017   COLONOSCOPY WITH PROPOFOL N/A 07/15/2020   Procedure: COLONOSCOPY WITH PROPOFOL;  Surgeon: Daneil Dolin, MD;  [facility];  Service: Endoscopy;  Laterality: N/A;  8:15AM TCS via Ostomy   COLOSTOMY N/A 11/12/2018   Procedure: COLOSTOMY;  Surgeon: Virl Cagey, MD;  Location: [facility];  Service: General;  Laterality: N/A;   COLOSTOMY REVERSAL N/A 10/02/2020   Procedure: COLOSTOMY  REVERSAL;  Surgeon: Virl Cagey, MD;  Location: [facility];  Service: General;  Laterality: N/A;   ESOPHAGOGASTRODUODENOSCOPY  08/10/2002     NUR: Mild changes of reflux esophagitis limited to gastroesophageal junction  No evidence of ring, stricture, or esophageal candidiasis dysphagia/ Gastritis, possibly H. pylori induced   ESOPHAGOGASTRODUODENOSCOPY  12/25/2002   NUR: Erosive antral gastritis.  The degree of gastritis is more significant than her last exam/ Normal examination of the esophagus/ Esophageal dilatation  performed by passing 59 and 37 [place]   ESOPHAGOGASTRODUODENOSCOPY  11/13/2010   RMR: Circumferential distal esophageal erosions with soft peptic stricture, consistent with erosive reflux esophagitis or stricture  formation, status post dilation as described above/  Small hiatal hernia/ Tiny antral erosions, otherwise normal stomach D1 and D2   ESOPHAGOGASTRODUODENOSCOPY (EGD) WITH PROPOFOL N/A 07/15/2020   Procedure: ESOPHAGOGASTRODUODENOSCOPY (EGD) WITH PROPOFOL;  Surgeon: Daneil Dolin, MD;  [facility];  Service: Endoscopy;  Laterality: N/A;   FLEXIBLE SIGMOIDOSCOPY N/A 07/15/2020   Procedure: FLEXIBLE SIGMOIDOSCOPY;  Surgeon: Daneil Dolin, MD;  [facility];  Service: Endoscopy;  Laterality: N/A;   HEMORRHOID SURGERY  09/02/2012   Procedure: HEMORRHOIDECTOMY;  Surgeon: Jamesetta So, MD;  Location: [facility];  Service: General;  Laterality: N/A;   LAPAROTOMY N/A 11/12/2018   Procedure: EXPLORATORY LAPAROTOMY;  Surgeon: Virl Cagey, MD;  Location: [facility];  Service: General;  Laterality: N/A;   MALONEY DILATION N/A 07/15/2020   Procedure: Keturah Shavers;  Surgeon: Daneil Dolin, MD;  [facility];  Service: Endoscopy;  Laterality: N/A;   PARTIAL COLECTOMY N/A 11/12/2018   Procedure: PARTIAL COLECTOMY;  Surgeon: Virl Cagey, MD;  Location: [facility];  Service: General;  Laterality: N/A;   POLYPECTOMY  07/15/2020   Procedure: POLYPECTOMY;  Surgeon: Daneil Dolin, MD;  [facility];  Service: Endoscopy;;   TUBAL LIGATION      Family History  Problem Relation Age of Onset   Diabetes Mother    Diabetes Brother    Colon cancer Brother        Diagnosed > age 108      Social History   Socioeconomic History   Marital status: Widowed    Spouse name: Not on file   Number of children: 3   Years of education: Not on file   Highest education level: Not on file  Occupational History   Occupation: retired    Fish farm manager: RETIRED   Tobacco Use   Smoking status: Never   Smokeless tobacco: Never  Vaping Use   Vaping Use: Never used  Substance and Sexual Activity   Alcohol use: No   Drug use: No   Sexual activity: Not Currently    Comment: declined condoms  Other Topics Concern   Not on file  Social History Narrative   Not on file   Social Determinants of Health   Financial Resource Strain: Not on file  Food Insecurity: Not on file  Transportation Needs: Not on file  Physical Activity: Not on file  Stress: Not on file  Social Connections: Not on file    Allergies  Allergen Reactions   Bee Venom Anaphylaxis   Promethazine Swelling   Tenofovir Disoproxil Fumarate [Tenofovir Disoproxil Fumarate] Other (See Comments)    Renal failure, (08/27/2006)   Compazine  [Prochlorperazine Edisylate] Nausea And Vomiting   Haloperidol Lactate Other (See Comments)    Reaction is muscle tension, causes severe spasms in face and neck. Forces  eyes to roll in the back of the head   Sulfa Antibiotics Diarrhea   Brompheniramine-Acetaminophen Other (See Comments)    unknown   Chlorcyclizine Other (See Comments)    Unknown   Chlorpromazine Other (See Comments)    Unknown   Codeine Itching and Nausea And Vomiting   Navane [Thiothixene] Other (See Comments) and Nausea And Vomiting    Same muscle spasm reaction as haldol   Prednisone Other (See Comments)    Brief mild psychosis and agitation   Promethazine Hcl Other (See Comments)   Propoxyphene N-Acetaminophen Itching and Nausea And Vomiting   Morphine Itching and Swelling     Current Outpatient Medications:    ALLERGY RELIEF 10 MG tablet, Take 10 mg by mouth daily., Disp: , Rfl:    ARIPiprazole (ABILIFY) 5 MG tablet, Take 10 mg by mouth daily., Disp: , Rfl:    atorvastatin (LIPITOR) 20 MG tablet, Take 20 mg by mouth daily., Disp: , Rfl:    bictegravir-emtricitabine-tenofovir AF (BIKTARVY) 50-200-25 MG TABS tablet, Take 1 tablet by mouth daily. Please discontinue  TRIUMEQ and TIVICAY, Disp: 30 tablet, Rfl: 11   clonazePAM (KLONOPIN) 1 MG tablet, Take 1 tablet (1 mg total) by mouth 2 (two) times daily as needed for up to 3 days for anxiety. (Patient taking differently: Take 1 mg by mouth 2 (two) times daily as needed for anxiety. For 14 days), Disp: 6 tablet, Rfl: 0   Cranberry 250 MG TABS, Take 250 mg by mouth every morning., Disp: , Rfl:    docusate sodium (COLACE) 100 MG capsule, Take 1 capsule (100 mg total) by mouth 2 (two) times daily., Disp: 10 capsule, Rfl: 0   DULoxetine (CYMBALTA) 60 MG capsule, Take 60 mg by mouth daily., Disp: , Rfl:    feeding supplement (ENSURE ENLIVE / ENSURE PLUS) LIQD, Take 237 mLs by mouth 2 (two) times daily between meals., Disp: 237 mL, Rfl: 12   fluticasone (FLONASE) 50 MCG/ACT nasal spray, 2 SPRAYS INTO BOTH NOSTRILS AT BEDTIME. (Patient taking differently: Place 2 sprays into both nostrils at bedtime.), Disp: 16 g, Rfl: 5   furosemide (LASIX) 40 MG tablet, Take 1 tablet (40 mg total) by mouth daily as needed for fluid. Hold until follow up with PCP (Patient taking differently: Take 40 mg by mouth daily as needed for fluid.), Disp: , Rfl:    losartan (COZAAR) 25 MG tablet, Take 12.5 mg by mouth daily., Disp: , Rfl:    Magnesium 100 MG TABS, Take 50 mg by mouth daily., Disp: , Rfl:    Menthol-Zinc Oxide 0.44-20.6 % OINT, Apply 1 application. topically every 8 (eight) hours as needed (protection). Apply to buttocks, Disp: , Rfl:    Multiple Vitamin (ONE-A-DAY 55 PLUS PO), Take 1 tablet by mouth daily., Disp: , Rfl:    oxymetazoline (AFRIN) 0.05 % nasal spray, Place 1 spray into both nostrils every 12 (twelve) hours as needed for congestion (Nose Bleeds)., Disp: , Rfl:    pantoprazole (PROTONIX) 40 MG tablet, Take 1 tablet (40 mg total) by mouth daily before breakfast., Disp: 30 tablet, Rfl: 5   polyethylene glycol (MIRALAX) 17 g packet, Take 17 g by mouth daily. Hold for diarrhea, Disp: , Rfl:    polyvinyl alcohol (LIQUIFILM  TEARS) 1.4 % ophthalmic solution, Place 1 drop into both eyes daily., Disp: , Rfl:    thiamine (VITAMIN B-1) 100 MG tablet, Take 100 mg by mouth daily., Disp: , Rfl:    Review of Systems  Unable to  perform ROS: Dementia       Objective:   Physical Exam        Assessment & Plan:   HIV disease:  I willorder HIV viral load CD4 count CBC with differential CMP, RPR GC and chlamydia and I will continue  Anibal Henderson I am continuing patient's prescription for [place], Dovato, Symtuza    CKD: I will check a CMP

## 2022-07-08 ENCOUNTER — Ambulatory Visit: Payer: Medicare Other | Admitting: Infectious Disease

## 2022-07-20 ENCOUNTER — Ambulatory Visit (INDEPENDENT_AMBULATORY_CARE_PROVIDER_SITE_OTHER): Payer: Medicare Other | Admitting: Physician Assistant

## 2022-07-20 VITALS — BP 128/80 | Temp 87.0°F

## 2022-07-20 DIAGNOSIS — R32 Unspecified urinary incontinence: Secondary | ICD-10-CM | POA: Diagnosis not present

## 2022-07-20 DIAGNOSIS — R339 Retention of urine, unspecified: Secondary | ICD-10-CM

## 2022-07-20 DIAGNOSIS — N390 Urinary tract infection, site not specified: Secondary | ICD-10-CM

## 2022-07-20 DIAGNOSIS — Z87440 Personal history of urinary (tract) infections: Secondary | ICD-10-CM | POA: Diagnosis not present

## 2022-07-20 LAB — BLADDER SCAN AMB NON-IMAGING: Scan Result: 8

## 2022-07-20 NOTE — Progress Notes (Signed)
post void residual=8 

## 2022-07-25 NOTE — Progress Notes (Signed)
Assessment: 1. Urinary retention - BLADDER SCAN AMB NON-IMAGING  2. Recurrent UTI  3. Incontinence of urine in female    Plan: Pt will continue current meds and staff will continue I&O caths for UA if indicated. Staff MD following for UTI history. FU if facility feels indicated. No orders of the defined types were placed in this encounter.    Chief Complaint: No chief complaint on file.   HPI: [name] is a 81 y.o. adult who presents for continued evaluation of incontinence and recurrent UTIs. Pt denies c/o today and no one from [facility] is with her to offer hx. She continues wearing diapers at all times due to incontinence. MAR indicates pt remains on Myrbetriq. Pt has been followed by staff provider since last eval in this office. Pt now in wheelchair and unable to transfer. No UA collected. Facility staff contacted and unsure why pt is being seen.   PVR=32m   01/20/22 [name] is a 81y.o. adult who presents for continued evaluation of recurrent UTIs.  Since the patient's last visit on 2/28, she was hospitalized for treatment of UTI, acute kidney injury after evaluation in the emergency department for altered mental status.  She was discharged to long-term care on 01/09/2022 following treatment with IV meropenem as was recommended by infectious disease.  [facility]  She presents today complaining of urinary burning and lower abdominal pain.  No recent fever, chills, nausea.  Mobility is a significant issue for the patient and although she can stand with a walker and assistance, she states she cannot walk.  I am unable to ascertain whether she uses a bedpan or transfer to a bedside commode for voiding.  Nursing assistant reports the patient is in diapers at all times.  Remainder of patient's history since discharge is somewhat limited secondary to her dementia.  Nursing home med list indicates patient is on  Myrbetriq 25 mg daily.   Patient is catheterized and residual urine = >2590mUA = greater than 30 WBCs, no RBCs, triple phosphate crystals, many bacteria, nitrite positive   History of recurrent UTI -Urine cx 2/28 with ESBL proteus mirabilis and enterococcus faecalis sensitive to ampicillin -Repeat urine culture sent on 3/13 is also growing Proteus mirabilis again pending sensitivity at this time.  Per ID recommendation, patient was given 3 days of IV meropenem.  No need of antibiotics at discharge. -CT scan of abdomen 3/13 with bladder wall thickening.  Last seen by urology as an outpatient on 2/28. -PTA, on Myrbetriq 25 mg daily  Portions of the above documentation were copied from a prior visit for review purposes only.  Allergies: Allergies  Allergen Reactions   Bee Venom Anaphylaxis   Promethazine Swelling   Tenofovir Disoproxil Fumarate [Tenofovir Disoproxil Fumarate] Other (See Comments)    Renal failure, (08/27/2006)   Compazine  [Prochlorperazine Edisylate] Nausea And Vomiting   Haloperidol Lactate Other (See Comments)    Reaction is muscle tension, causes severe spasms in face and neck. Forces eyes to roll in the back of the head   Sulfa Antibiotics Diarrhea   Brompheniramine-Acetaminophen Other (See Comments)    unknown   Chlorcyclizine Other (See Comments)    Unknown   Chlorpromazine Other (See Comments)    Unknown   Codeine Itching and Nausea And Vomiting   Navane [Thiothixene (Tiotixene)] Other (See Comments) and Nausea And Vomiting    Same muscle spasm reaction as haldol   Prednisone  Other (See Comments)    Brief mild psychosis and agitation   Promethazine Hcl Other (See Comments)   Propoxyphene N-Acetaminophen Itching and Nausea And Vomiting   Morphine Itching and Swelling    PMH: Past Medical History:  Diagnosis Date   Anxiety    Arthritis    Bronchitis    Bronchitis    CKD (chronic kidney disease) stage 3, GFR 30-59 ml/min ([place]) 3/00/9233    Complication of anesthesia    pt states she had a cardiac arrest during hysterectomy 1985.  Pt had several surgies since then that were uneventful   Confusion 01/05/2022   COPD (chronic obstructive pulmonary disease) ([place])    Dementia ([place]) 01/05/2022   Depression    Dizziness 01/05/2022   Dysuria 03/27/2019   Elevated LFTs 05/04/2016   History of TIAs 1981   left side weakness   HIV (human immunodeficiency virus infection) ([place])    HIV infection with neurological disease ([place]) 05/25/2022   Hypertension    Kidney disease related to HIV infection ([place])    pt states she has to have her creatinine level checked often    Low blood pressure 01/05/2022   Moderate single current episode of major depressive disorder ([place]) 03/12/2021   Nausea 03/03/2017   Pelvic pain 05/25/2022   Peripheral neuropathy    bilateral feet   Seasonal allergies 03/27/2019   Sinusitis 05/06/2018   UTI (urinary tract infection) 03/03/2017   Weight loss 03/03/2017    PSH: Past Surgical History:  Procedure Laterality Date   ABDOMINAL HYSTERECTOMY  1985   BACK SURGERY     BIOPSY  07/15/2020   Procedure: BIOPSY;  Surgeon: Daneil Dolin, MD;  [facility];  Service: Endoscopy;;   CATARACT EXTRACTION, BILATERAL     CHOLECYSTECTOMY     COLONOSCOPY  08/10/2002   NUR: Normal colonoscopy except for some hemorrhoids and some erythema at the dentate line   COLONOSCOPY  12/10/2006   [place]:Diffuse colitis in the transverse and descending colon/This is not entirely typical of ischemic colitis or her HIV positive/ disease.  We need to be concerned about other causes   COLONOSCOPY  01/19/2011   RMR: Internal and external hemorrhoids, likely source of hematochezia anal papilla, otherwise normal rectal mucosa/ Left-sided diverticula.  Cecal polyp with status post cold snare polypectomy.  Remainder of colonic mucosa appeared normal. Pathology with tubular adenoma. Repeat in 2017   COLONOSCOPY WITH PROPOFOL N/A 07/15/2020    Procedure: COLONOSCOPY WITH PROPOFOL;  Surgeon: Daneil Dolin, MD;  [facility];  Service: Endoscopy;  Laterality: N/A;  8:15AM TCS via Ostomy   COLOSTOMY N/A 11/12/2018   Procedure: COLOSTOMY;  Surgeon: Virl Cagey, MD;  Location: [facility];  Service: General;  Laterality: N/A;   COLOSTOMY REVERSAL N/A 10/02/2020   Procedure: COLOSTOMY REVERSAL;  Surgeon: Virl Cagey, MD;  Location: [facility];  Service: General;  Laterality: N/A;   ESOPHAGOGASTRODUODENOSCOPY  08/10/2002     NUR: Mild changes of reflux esophagitis limited to gastroesophageal junction  No evidence of ring, stricture, or esophageal candidiasis dysphagia/ Gastritis, possibly H. pylori induced   ESOPHAGOGASTRODUODENOSCOPY  12/25/2002   NUR: Erosive antral gastritis.  The degree of gastritis is more significant than her last exam/ Normal examination of the esophagus/ Esophageal dilatation performed by passing 33 and [address] [place]   ESOPHAGOGASTRODUODENOSCOPY  11/13/2010   RMR: Circumferential distal esophageal erosions with soft peptic stricture, consistent with erosive reflux esophagitis or stricture  formation, status post dilation  as described above/  Small hiatal hernia/ Tiny antral erosions, otherwise normal stomach D1 and D2   ESOPHAGOGASTRODUODENOSCOPY (EGD) WITH PROPOFOL N/A 07/15/2020   Procedure: ESOPHAGOGASTRODUODENOSCOPY (EGD) WITH PROPOFOL;  Surgeon: Daneil Dolin, MD;  [facility];  Service: Endoscopy;  Laterality: N/A;   FLEXIBLE SIGMOIDOSCOPY N/A 07/15/2020   Procedure: FLEXIBLE SIGMOIDOSCOPY;  Surgeon: Daneil Dolin, MD;  [facility];  Service: Endoscopy;  Laterality: N/A;   HEMORRHOID SURGERY  09/02/2012   Procedure: HEMORRHOIDECTOMY;  Surgeon: Jamesetta So, MD;  Location: [facility];  Service: General;  Laterality: N/A;   LAPAROTOMY N/A 11/12/2018   Procedure: EXPLORATORY LAPAROTOMY;  Surgeon: Virl Cagey, MD;  Location: [facility];  Service: General;  Laterality: N/A;    MALONEY DILATION N/A 07/15/2020   Procedure: Keturah Shavers;  Surgeon: Daneil Dolin, MD;  [facility];  Service: Endoscopy;  Laterality: N/A;   PARTIAL COLECTOMY N/A 11/12/2018   Procedure: PARTIAL COLECTOMY;  Surgeon: Virl Cagey, MD;  Location: [facility];  Service: General;  Laterality: N/A;   POLYPECTOMY  07/15/2020   Procedure: POLYPECTOMY;  Surgeon: Daneil Dolin, MD;  [facility];  Service: Endoscopy;;   TUBAL LIGATION      SH: Social History   Tobacco Use   Smoking status: Never   Smokeless tobacco: Never  Vaping Use   Vaping Use: Never used  Substance Use Topics   Alcohol use: No   Drug use: No    ROS: All other review of systems were reviewed and are negative except what is noted above in HPI  PE: BP 128/80   Temp (!) 88 F (30.6 C)  GENERAL APPEARANCE:  Well appearing, well developed, well nourished, NAD HEENT:  Atraumatic, normocephalic NECK:  Supple. Trachea midline ABDOMEN:  Soft, non-tender, no masses EXTREMITIES:  Moves all extremities well, without clubbing, cyanosis, or edema NEUROLOGIC:  Alert and oriented x 3, normal gait, CN II-XII grossly intact MENTAL STATUS:  appropriate BACK:  Non-tender to palpation, No CVAT SKIN:  Warm, dry, and intact   Results: Laboratory Data: Lab Results  Component Value Date   WBC 10.4 05/25/2022   HGB 15.8 (H) 05/25/2022   HCT 44.2 05/25/2022   MCV 98.9 05/25/2022   PLT 300 05/25/2022    Lab Results  Component Value Date   CREATININE 1.53 (H) 05/25/2022    No results found for: "PSA"  No results found for: "TESTOSTERONE"  Lab Results  Component Value Date   HGBA1C 4.9 09/30/2020    Urinalysis    Component Value Date/Time   COLORURINE AMBER (A) 01/05/2022 1510   APPEARANCEUR Turbid (A) 01/20/2022 1450   LABSPEC 1.020 01/05/2022 1510   PHURINE 7.0 01/05/2022 1510   GLUCOSEU Negative 01/20/2022 1450   GLUCOSEU NEG mg/dL 09/20/2007 1114   HGBUR NEGATIVE 01/05/2022  1510   HGBUR moderate 06/04/2010 1425   BILIRUBINUR Negative 01/20/2022 1450   KETONESUR NEGATIVE 01/05/2022 1510   PROTEINUR 2+ (A) 01/20/2022 1450   PROTEINUR 100 (A) 01/05/2022 1510   UROBILINOGEN 1.0 05/31/2014 0933   NITRITE Positive (A) 01/20/2022 1450   NITRITE POSITIVE (A) 01/05/2022 1510   LEUKOCYTESUR 1+ (A) 01/20/2022 1450   LEUKOCYTESUR MODERATE (A) 01/05/2022 1510    Lab Results  Component Value Date   LABMICR See below: 01/20/2022   WBCUA >30 (H) 01/20/2022   LABEPIT 0-10 01/20/2022   MUCUS Present 12/23/2021   BACTERIA Many (A) 01/20/2022    Pertinent Imaging: Results for  orders placed during the hospital encounter of 06/20/21  DG Abd 1 View  Narrative CLINICAL DATA:  Nasogastric tube.  EXAM: ABDOMEN - 1 VIEW  COMPARISON:  None.  FINDINGS: The bowel gas pattern is normal. Distal tip of nasogastric tube is seen in the stomach. Status post cholecystectomy. No radio-opaque calculi or other significant radiographic abnormality are seen.  IMPRESSION: Distal tip of nasogastric tube seen in stomach.   Electronically Signed By: Marijo Conception M.D. On: 06/22/2021 08:09  No results found for this or any previous visit.  No results found for this or any previous visit.  No results found for this or any previous visit.  Results for orders placed during the hospital encounter of 08/10/06  US Renal  Narrative Clinical Data: 81 year old with rising creatinine. RENAL/URINARY TRACT ULTRASOUND: Technique: Complete ultrasound examination of the urinary tract was performed including evaluation of the kidneys, renal collecting systems, and urinary bladder. Comparison: None. Findings: The right kidney measures 10.1 cm and the left kidney measures 12.2 cm. Left kidney demonstrates increased echogenicity of the renal parenchyma and mild caliectasis, but no obvious hydronephrosis. No renal masses or perinephric fluid collections. Bladder is  unremarkable.  Impression Echogenic left kidney with mild caliectasis but no obvious hydronephrosis. The right kidney appears normal.  Provider: Teena Dunk  No valid procedures specified. No results found for this or any previous visit.  No results found for this or any previous visit.  No results found for this or any previous visit (from the past 24 hour(s)).

## 2022-11-17 ENCOUNTER — Inpatient Hospital Stay (HOSPITAL_COMMUNITY)
Admission: EM | Admit: 2022-11-17 | Discharge: 2022-11-20 | DRG: 689 | Disposition: A | Discharge status: Skilled Nursing Facility | Payer: Medicare Other | Source: Skilled Nursing Facility | Attending: Internal Medicine | Admitting: Internal Medicine

## 2022-11-17 ENCOUNTER — Other Ambulatory Visit: Payer: Self-pay

## 2022-11-17 DIAGNOSIS — B20 Human immunodeficiency virus [HIV] disease: Secondary | ICD-10-CM | POA: Diagnosis not present

## 2022-11-17 DIAGNOSIS — Z833 Family history of diabetes mellitus: Secondary | ICD-10-CM

## 2022-11-17 DIAGNOSIS — N1832 Chronic kidney disease, stage 3b: Secondary | ICD-10-CM | POA: Diagnosis present

## 2022-11-17 DIAGNOSIS — N3001 Acute cystitis with hematuria: Principal | ICD-10-CM | POA: Diagnosis present

## 2022-11-17 DIAGNOSIS — Z79899 Other long term (current) drug therapy: Secondary | ICD-10-CM

## 2022-11-17 DIAGNOSIS — I10 Essential (primary) hypertension: Secondary | ICD-10-CM | POA: Diagnosis not present

## 2022-11-17 DIAGNOSIS — N390 Urinary tract infection, site not specified: Secondary | ICD-10-CM | POA: Diagnosis not present

## 2022-11-17 DIAGNOSIS — F32A Depression, unspecified: Secondary | ICD-10-CM | POA: Diagnosis present

## 2022-11-17 DIAGNOSIS — Z91030 Bee allergy status: Secondary | ICD-10-CM

## 2022-11-17 DIAGNOSIS — F419 Anxiety disorder, unspecified: Secondary | ICD-10-CM | POA: Diagnosis present

## 2022-11-17 DIAGNOSIS — F0390 Unspecified dementia without behavioral disturbance: Secondary | ICD-10-CM | POA: Diagnosis present

## 2022-11-17 DIAGNOSIS — G629 Polyneuropathy, unspecified: Secondary | ICD-10-CM | POA: Diagnosis present

## 2022-11-17 DIAGNOSIS — F203 Undifferentiated schizophrenia: Secondary | ICD-10-CM | POA: Diagnosis present

## 2022-11-17 DIAGNOSIS — Z888 Allergy status to other drugs, medicaments and biological substances status: Secondary | ICD-10-CM

## 2022-11-17 DIAGNOSIS — Z8673 Personal history of transient ischemic attack (TIA), and cerebral infarction without residual deficits: Secondary | ICD-10-CM

## 2022-11-17 DIAGNOSIS — G9341 Metabolic encephalopathy: Secondary | ICD-10-CM | POA: Diagnosis present

## 2022-11-17 DIAGNOSIS — I129 Hypertensive chronic kidney disease with stage 1 through stage 4 chronic kidney disease, or unspecified chronic kidney disease: Secondary | ICD-10-CM | POA: Diagnosis present

## 2022-11-17 DIAGNOSIS — Z882 Allergy status to sulfonamides status: Secondary | ICD-10-CM

## 2022-11-17 DIAGNOSIS — Z885 Allergy status to narcotic agent status: Secondary | ICD-10-CM

## 2022-11-17 DIAGNOSIS — Z1624 Resistance to multiple antibiotics: Secondary | ICD-10-CM | POA: Diagnosis present

## 2022-11-17 DIAGNOSIS — B964 Proteus (mirabilis) (morganii) as the cause of diseases classified elsewhere: Secondary | ICD-10-CM | POA: Diagnosis present

## 2022-11-17 DIAGNOSIS — N183 Chronic kidney disease, stage 3 unspecified: Secondary | ICD-10-CM | POA: Diagnosis present

## 2022-11-17 DIAGNOSIS — Z66 Do not resuscitate: Secondary | ICD-10-CM | POA: Diagnosis present

## 2022-11-17 DIAGNOSIS — Z21 Asymptomatic human immunodeficiency virus [HIV] infection status: Secondary | ICD-10-CM | POA: Diagnosis present

## 2022-11-17 DIAGNOSIS — J449 Chronic obstructive pulmonary disease, unspecified: Secondary | ICD-10-CM | POA: Diagnosis present

## 2022-11-17 LAB — URINALYSIS, ROUTINE W REFLEX MICROSCOPIC
Bilirubin Urine: NEGATIVE
Glucose, UA: NEGATIVE mg/dL
Ketones, ur: 5 mg/dL — AB
Nitrite: POSITIVE — AB
Protein, ur: >=300 mg/dL — AB
RBC / HPF: >50 RBC/hpf — ABNORMAL HIGH (ref 0–5)
Specific Gravity, Urine: 1.019 (ref 1.005–1.030)
WBC, UA: >50 WBC/hpf — ABNORMAL HIGH (ref 0–5)
pH: 7 (ref 5.0–8.0)

## 2022-11-17 LAB — COMPREHENSIVE METABOLIC PANEL
ALT: 14 U/L (ref 0–44)
AST: 17 U/L (ref 15–41)
Albumin: 3.9 g/dL (ref 3.5–5.0)
Alkaline Phosphatase: 95 U/L (ref 38–126)
Anion gap: 7 (ref 5–15)
BUN: 22 mg/dL (ref 8–23)
CO2: 20 mmol/L — ABNORMAL LOW (ref 22–32)
Calcium: 9.9 mg/dL (ref 8.9–10.3)
Chloride: 109 mmol/L (ref 98–111)
Creatinine, Ser: 1.34 mg/dL — ABNORMAL HIGH (ref 0.44–1.00)
GFR, Estimated: 40 mL/min — ABNORMAL LOW (ref >60)
Glucose, Bld: 115 mg/dL — ABNORMAL HIGH (ref 70–99)
Potassium: 4.4 mmol/L (ref 3.5–5.1)
Sodium: 136 mmol/L (ref 135–145)
Total Bilirubin: 0.8 mg/dL (ref 0.3–1.2)
Total Protein: 6.8 g/dL (ref 6.5–8.1)

## 2022-11-17 LAB — CBC WITH DIFFERENTIAL/PLATELET
Abs Immature Granulocytes: 0.01 10*3/uL (ref 0.00–0.07)
Basophils Absolute: 0 10*3/uL (ref 0.0–0.1)
Basophils Relative: 0 %
Eosinophils Absolute: 0.2 10*3/uL (ref 0.0–0.5)
Eosinophils Relative: 3 %
HCT: 41.5 % (ref 36.0–46.0)
Hemoglobin: 14.1 g/dL (ref 12.0–15.0)
Immature Granulocytes: 0 %
Lymphocytes Relative: 38 %
Lymphs Abs: 2.5 10*3/uL (ref 0.7–4.0)
MCH: 32.4 pg (ref 26.0–34.0)
MCHC: 34 g/dL (ref 30.0–36.0)
MCV: 95.4 fL (ref 80.0–100.0)
Monocytes Absolute: 0.5 10*3/uL (ref 0.1–1.0)
Monocytes Relative: 7 %
Neutro Abs: 3.5 10*3/uL (ref 1.7–7.7)
Neutrophils Relative %: 52 %
Platelets: 216 10*3/uL (ref 150–400)
RBC: 4.35 MIL/uL (ref 3.87–5.11)
RDW: 13.2 % (ref 11.5–15.5)
WBC: 6.7 10*3/uL (ref 4.0–10.5)
nRBC: 0 % (ref 0.0–0.2)

## 2022-11-17 LAB — LACTIC ACID, PLASMA
Lactic Acid, Venous: 1.2 mmol/L (ref 0.5–1.9)
Lactic Acid, Venous: 0.7 mmol/L (ref 0.5–1.9)

## 2022-11-17 MED ORDER — SODIUM CHLORIDE 0.9 % IV SOLN
1.0000 g | Freq: Two times a day (BID) | INTRAVENOUS | Status: DC
  Administered 2022-11-17 – 2022-11-20 (×6): 1 g via INTRAVENOUS
  Filled 2022-11-17 (×6): qty 20

## 2022-11-17 MED ORDER — PANTOPRAZOLE SODIUM 40 MG PO TBEC
40.0000 mg | DELAYED_RELEASE_TABLET | Freq: Every day | ORAL | Status: DC
  Administered 2022-11-18 – 2022-11-20 (×3): 40 mg via ORAL
  Filled 2022-11-17 (×3): qty 1

## 2022-11-17 MED ORDER — HEPARIN SODIUM (PORCINE) 5000 UNIT/ML IJ SOLN
5000.0000 [IU] | Freq: Three times a day (TID) | INTRAMUSCULAR | Status: DC
  Administered 2022-11-17 – 2022-11-20 (×8): 5000 [IU] via SUBCUTANEOUS
  Filled 2022-11-17 (×8): qty 1

## 2022-11-17 MED ORDER — ACETAMINOPHEN 650 MG RE SUPP: 650.0000 mg | Freq: Four times a day (QID) | RECTAL | Status: DC | PRN

## 2022-11-17 MED ORDER — LOSARTAN POTASSIUM 25 MG PO TABS
12.5000 mg | ORAL_TABLET | Freq: Every day | ORAL | Status: DC
  Administered 2022-11-18 – 2022-11-20 (×3): 12.5 mg via ORAL
  Filled 2022-11-17 (×3): qty 1

## 2022-11-17 MED ORDER — ONDANSETRON HCL 4 MG PO TABS: 4.0000 mg | ORAL_TABLET | Freq: Four times a day (QID) | ORAL | Status: DC | PRN

## 2022-11-17 MED ORDER — SENNA 8.6 MG PO TABS
1.0000 | ORAL_TABLET | Freq: Two times a day (BID) | ORAL | Status: DC
  Administered 2022-11-17 – 2022-11-20 (×6): 8.6 mg via ORAL
  Filled 2022-11-17 (×6): qty 1

## 2022-11-17 MED ORDER — CLONAZEPAM 0.5 MG PO TABS
0.5000 mg | ORAL_TABLET | Freq: Two times a day (BID) | ORAL | Status: DC
  Administered 2022-11-17 – 2022-11-20 (×6): 0.5 mg via ORAL
  Filled 2022-11-17 (×6): qty 1

## 2022-11-17 MED ORDER — DULOXETINE HCL 60 MG PO CPEP
60.0000 mg | ORAL_CAPSULE | Freq: Every day | ORAL | Status: DC
  Administered 2022-11-18 – 2022-11-20 (×3): 60 mg via ORAL
  Filled 2022-11-17 (×3): qty 1

## 2022-11-17 MED ORDER — BICTEGRAVIR-EMTRICITAB-TENOFOV 50-200-25 MG PO TABS
1.0000 | ORAL_TABLET | Freq: Every day | ORAL | Status: DC
  Administered 2022-11-18 – 2022-11-20 (×3): 1 via ORAL
  Filled 2022-11-17 (×6): qty 1

## 2022-11-17 MED ORDER — POLYETHYLENE GLYCOL 3350 17 G PO PACK: 17.0000 g | PACK | Freq: Every day | ORAL | Status: DC | PRN

## 2022-11-17 MED ORDER — ARIPIPRAZOLE 5 MG PO TABS
5.0000 mg | ORAL_TABLET | Freq: Every day | ORAL | Status: DC
  Administered 2022-11-18 – 2022-11-20 (×3): 5 mg via ORAL
  Filled 2022-11-17 (×3): qty 1

## 2022-11-17 MED ORDER — DOCUSATE SODIUM 100 MG PO CAPS
100.0000 mg | ORAL_CAPSULE | Freq: Two times a day (BID) | ORAL | Status: DC
  Administered 2022-11-17 – 2022-11-20 (×5): 100 mg via ORAL
  Filled 2022-11-17 (×6): qty 1

## 2022-11-17 MED ORDER — ONDANSETRON HCL 4 MG/2ML IJ SOLN: 4.0000 mg | Freq: Four times a day (QID) | INTRAMUSCULAR | Status: DC | PRN

## 2022-11-17 MED ORDER — ACETAMINOPHEN 325 MG PO TABS
650.0000 mg | ORAL_TABLET | Freq: Four times a day (QID) | ORAL | Status: DC | PRN
  Administered 2022-11-19: 650 mg via ORAL
  Filled 2022-11-17: qty 2

## 2022-11-17 NOTE — Assessment & Plan Note (Signed)
Last viral load 04/2022- Undetectable, and CD4 count 571. -Resume Biktarvy

## 2022-11-17 NOTE — Assessment & Plan Note (Addendum)
Symptomatic with dysuria over the past week.  Afebrile without leukocytosis.  Lactic acid 1.2 > 0.7.  Rules out for sepsis.  History of multidrug-resistant UTI hence hospitalization requested.  Last urine cultures 01/07/2022 grew Proteus mirabilis resistant to multiple antibiotics, sensitive to imipenem, Zosyn and cefepime.  Treated with meropenem in the past - EDP consulted with pharmacy, started on meropenem -Follow-up urine cultures

## 2022-11-17 NOTE — ED Notes (Signed)
Pt provided permission to speak with [name] who called and identified herself as the POA, at this time no results are available to report, contact info is on file, MD requested to contact with updates

## 2022-11-17 NOTE — H&P (Signed)
History and Physical    [name] AOZ:[id number] DOB: November 10, 1940 DOA: 11/17/2022  PCP: Caprice Renshaw, MD   Patient coming from: [place]  I have personally briefly reviewed patient's old medical records in [place]  Chief Complaint: Urinary symptoms  HPI: [name] is a 82 y.o. adult with medical history significant for COPD, CKD 3, dementia, HIV,HTN. Patient was sent to the ED from nursing home with reports of confusion.  At the time of my evaluation, patient is sleeping but easily arousable, is able to answer simple questions, oriented to person and place but not situation.  ED provider talked to staff at nursing home, today patient was not as talkative as she normally is, but otherwise mental status appears to be at baseline. Patient confirms that over the past week she has had burning with urination.  She denies vomiting reported breathing.  I talked to patient's daughter-in-law who is power of attorney- [name], patient's mentation is at baseline.  Confirms above history also reports poor oral intake over the past week.  ED Course: Temperature 97.8.  Heart rate 67-76.  Respirate rate 18 -20.  Blood pressure systolic 962X to 528U.  UA suggestive of UTI with many bacteria, positive nitrites moderate leukocytes.  Lactic acid 1.2 > 0.7. Patient has a history of multidrug-resistant bacteria, last urine cultures 12/2021 g Proteus mirabilis.  After consultation with pharmacy decision was made to give meropenem hence hospitalization requested.  Review of Systems: Unable to assess due to baseline dementia.  Past Medical History:  Diagnosis Date   Anxiety    Arthritis    Bronchitis    Bronchitis    CKD (chronic kidney disease) stage 3, GFR 30-59 ml/min ([place]) 1/32/4401   Complication of anesthesia    pt states she had a cardiac arrest during hysterectomy 1985.  Pt had several surgies since then that were uneventful   Confusion 01/05/2022   COPD (chronic  obstructive pulmonary disease) ([place])    Dementia ([place]) 01/05/2022   Depression    Dizziness 01/05/2022   Dysuria 03/27/2019   Elevated LFTs 05/04/2016   History of TIAs 1981   left side weakness   HIV (human immunodeficiency virus infection) ([place])    HIV infection with neurological disease ([place]) 05/25/2022   Hypertension    Kidney disease related to HIV infection ([place])    pt states she has to have her creatinine level checked often    Low blood pressure 01/05/2022   Moderate single current episode of major depressive disorder ([place]) 03/12/2021   Nausea 03/03/2017   Pelvic pain 05/25/2022   Peripheral neuropathy    bilateral feet   Seasonal allergies 03/27/2019   Sinusitis 05/06/2018   UTI (urinary tract infection) 03/03/2017   Weight loss 03/03/2017    Past Surgical History:  Procedure Laterality Date   ABDOMINAL HYSTERECTOMY  1985   BACK SURGERY     BIOPSY  07/15/2020   Procedure: BIOPSY;  Surgeon: Daneil Dolin, MD;  [facility];  Service: Endoscopy;;   CATARACT EXTRACTION, BILATERAL     CHOLECYSTECTOMY     COLONOSCOPY  08/10/2002   NUR: Normal colonoscopy except for some hemorrhoids and some erythema at the dentate line   COLONOSCOPY  12/10/2006   Edwards:Diffuse colitis in the transverse and descending colon/This is not entirely typical of ischemic colitis or her HIV positive/ disease.  We need to be concerned about other causes   COLONOSCOPY  01/19/2011   RMR: Internal and external  hemorrhoids, likely source of hematochezia anal papilla, otherwise normal rectal mucosa/ Left-sided diverticula.  Cecal polyp with status post cold snare polypectomy.  Remainder of colonic mucosa appeared normal. Pathology with tubular adenoma. Repeat in 2017   COLONOSCOPY WITH PROPOFOL N/A 07/15/2020   Procedure: COLONOSCOPY WITH PROPOFOL;  Surgeon: Daneil Dolin, MD;  [facility];  Service: Endoscopy;  Laterality: N/A;  8:15AM TCS via Ostomy   COLOSTOMY N/A 11/12/2018   Procedure:  COLOSTOMY;  Surgeon: Virl Cagey, MD;  Location: [facility];  Service: General;  Laterality: N/A;   COLOSTOMY REVERSAL N/A 10/02/2020   Procedure: COLOSTOMY REVERSAL;  Surgeon: Virl Cagey, MD;  Location: [facility];  Service: General;  Laterality: N/A;   ESOPHAGOGASTRODUODENOSCOPY  08/10/2002     NUR: Mild changes of reflux esophagitis limited to gastroesophageal junction  No evidence of ring, stricture, or esophageal candidiasis dysphagia/ Gastritis, possibly H. pylori induced   ESOPHAGOGASTRODUODENOSCOPY  12/25/2002   NUR: Erosive antral gastritis.  The degree of gastritis is more significant than her last exam/ Normal examination of the esophagus/ Esophageal dilatation performed by passing 35 and 2 [place]   ESOPHAGOGASTRODUODENOSCOPY  11/13/2010   RMR: Circumferential distal esophageal erosions with soft peptic stricture, consistent with erosive reflux esophagitis or stricture  formation, status post dilation as described above/  Small hiatal hernia/ Tiny antral erosions, otherwise normal stomach D1 and D2   ESOPHAGOGASTRODUODENOSCOPY (EGD) WITH PROPOFOL N/A 07/15/2020   Procedure: ESOPHAGOGASTRODUODENOSCOPY (EGD) WITH PROPOFOL;  Surgeon: Daneil Dolin, MD;  [facility];  Service: Endoscopy;  Laterality: N/A;   FLEXIBLE SIGMOIDOSCOPY N/A 07/15/2020   Procedure: FLEXIBLE SIGMOIDOSCOPY;  Surgeon: Daneil Dolin, MD;  [facility];  Service: Endoscopy;  Laterality: N/A;   HEMORRHOID SURGERY  09/02/2012   Procedure: HEMORRHOIDECTOMY;  Surgeon: Jamesetta So, MD;  Location: [facility];  Service: General;  Laterality: N/A;   LAPAROTOMY N/A 11/12/2018   Procedure: EXPLORATORY LAPAROTOMY;  Surgeon: Virl Cagey, MD;  Location: [facility];  Service: General;  Laterality: N/A;   MALONEY DILATION N/A 07/15/2020   Procedure: Keturah Shavers;  Surgeon: Daneil Dolin, MD;  [facility];  Service: Endoscopy;  Laterality: N/A;   PARTIAL COLECTOMY N/A 11/12/2018    Procedure: PARTIAL COLECTOMY;  Surgeon: Virl Cagey, MD;  Location: [facility];  Service: General;  Laterality: N/A;   POLYPECTOMY  07/15/2020   Procedure: POLYPECTOMY;  Surgeon: Daneil Dolin, MD;  [facility];  Service: Endoscopy;;   TUBAL LIGATION       reports that she has never smoked. She has never used smokeless tobacco. She reports that she does not drink alcohol and does not use drugs.  Allergies  Allergen Reactions   Bee Venom Anaphylaxis   Promethazine Swelling   Tenofovir Disoproxil Fumarate [Tenofovir Disoproxil Fumarate] Other (See Comments)    Renal failure, (08/27/2006)   Compazine  [Prochlorperazine Edisylate] Nausea And Vomiting   Haloperidol Lactate Other (See Comments)    Reaction is muscle tension, causes severe spasms in face and neck. Forces eyes to roll in the back of the head   Sulfa Antibiotics Diarrhea   Brompheniramine-Acetaminophen Other (See Comments)    unknown   Chlorcyclizine Other (See Comments)    Unknown   Chlorpromazine Other (See Comments)    Unknown   Codeine Itching and Nausea And Vomiting   Navane [Thiothixene (Tiotixene)] Other (See Comments) and Nausea And Vomiting    Same muscle spasm reaction as  haldol   Prednisone Other (See Comments)    Brief mild psychosis and agitation   Promethazine Hcl Other (See Comments)   Propoxyphene N-Acetaminophen Itching and Nausea And Vomiting   Morphine Itching and Swelling    Family History  Problem Relation Age of Onset   Diabetes Mother    Diabetes Brother    Colon cancer Brother        Diagnosed > age 30    Prior to Admission medications   Medication Sig Start Date End Date Taking? Authorizing Provider  ALLERGY RELIEF 10 MG tablet Take 10 mg by mouth daily. 05/02/21  Yes [provider]  ARIPiprazole (ABILIFY) 5 MG tablet Take 5 mg by mouth daily. 10/17/21  Yes [provider]  atorvastatin (LIPITOR) 20 MG tablet Take 20 mg by mouth daily.   Yes [provider]  bictegravir-emtricitabine-tenofovir AF (BIKTARVY) 50-200-25 MG TABS tablet Take 1 tablet by mouth daily. Please discontinue TRIUMEQ and TIVICAY 05/25/22  Yes [name], Lavell Islam, MD  cephALEXin (KEFLEX) 250 MG capsule Take 250 mg by mouth at bedtime.   Yes [provider]  clonazePAM (KLONOPIN) 0.5 MG tablet Take 0.5 mg by mouth 2 (two) times daily. 11/16/22  Yes [provider]  Cranberry 250 MG TABS Take 450 mg by mouth every morning.   Yes [provider]  docusate sodium (COLACE) 100 MG capsule Take 1 capsule (100 mg total) by mouth 2 (two) times daily. 11/21/18  Yes Virl Cagey, MD  DULoxetine (CYMBALTA) 60 MG capsule Take 60 mg by mouth daily. 02/28/21  Yes [provider]  fluconazole (DIFLUCAN) 150 MG tablet Take 150 mg by mouth See admin instructions. Give 1 tablet by mouth one time a day every 3 days for 3 administrations. Start 11/12/22   Yes [provider]  fluticasone (FLONASE) 50 MCG/ACT nasal spray 2 SPRAYS INTO BOTH NOSTRILS AT BEDTIME. Patient taking differently: Place 2 sprays into both nostrils at bedtime. 05/22/19  Yes [name], Lavell Islam, MD  furosemide (LASIX) 40 MG tablet Take 1 tablet (40 mg total) by mouth daily as needed for fluid. Hold until follow up with PCP Patient taking differently: Take 40 mg by mouth daily as needed for fluid. 12/31/19  Yes Barton Dubois, MD  losartan (COZAAR) 25 MG tablet Take 12.5 mg by mouth daily. 05/02/21  Yes [provider]  Magnesium 100 MG TABS Take 400 mg by mouth daily.   Yes [provider]  Multiple Vitamin (ONE-A-DAY 55 PLUS PO) Take 1 tablet by mouth daily.   Yes [provider]  MYRBETRIQ 25 MG TB24 tablet Take 25 mg by mouth daily. 11/08/22  Yes [provider]  oxymetazoline (AFRIN) 0.05 % nasal spray Place 1 spray into both nostrils every 12 (twelve) hours as needed for congestion (Nose Bleeds).   Yes [provider]   pantoprazole (PROTONIX) 40 MG tablet Take 1 tablet (40 mg total) by mouth daily before breakfast. 07/16/20 02/03/49 Yes Harper, Tivis Ringer, PA-C  polyethylene glycol (MIRALAX) 17 g packet Take 17 g by mouth daily. Hold for diarrhea 06/26/21  Yes Barton Dubois, MD  polyvinyl alcohol (LIQUIFILM TEARS) 1.4 % ophthalmic solution Place 1 drop into both eyes daily.   Yes [provider]  senna (SENOKOT) 8.6 MG tablet Take 1 tablet by mouth 2 (two) times daily.   Yes [provider]  thiamine (VITAMIN B-1) 100 MG tablet Take 100 mg by mouth daily.   Yes [provider]  clonazePAM (KLONOPIN) 1 MG tablet Take 1 tablet (1 mg total) by mouth 2 (two) times daily as needed for up to 3 days for anxiety. Patient not taking: Reported on 11/17/2022 01/09/22 02/03/49  Terrilee Croak, MD  feeding supplement (ENSURE ENLIVE / ENSURE PLUS) LIQD Take 237 mLs by mouth 2 (two) times daily between meals. 01/09/22   Terrilee Croak, MD  Menthol-Zinc Oxide 0.44-20.6 % OINT Apply 1 application. topically every 8 (eight) hours as needed (protection). Apply to buttocks    [provider]    Physical Exam: Vitals:   11/17/22 1337 11/17/22 1400 11/17/22 1430 11/17/22 1500  BP:  (!) 145/71 (!) 142/66 (!) 140/67  Pulse:  67    Resp:  '19 20 19  '$ Temp:      TempSrc:      SpO2:  96%    Weight: 73.1 kg     Height: '5\' 2"'$  (1.575 m)       Constitutional: Sleeping but easily arousable, calm, comfortable Vitals:   11/17/22 1337 11/17/22 1400 11/17/22 1430 11/17/22 1500  BP:  (!) 145/71 (!) 142/66 (!) 140/67  Pulse:  67    Resp:  '19 20 19  '$ Temp:      TempSrc:      SpO2:  96%    Weight: 73.1 kg     Height: '5\' 2"'$  (1.575 m)      Eyes: PERRL, lids and conjunctivae normal ENMT: Mucous membranes are moist.  Neck: normal, supple, no masses, no thyromegaly Respiratory: clear to auscultation bilaterally, no wheezing, no crackles. Normal respiratory effort. No accessory muscle use.  Cardiovascular:  Regular rate and rhythm, no murmurs / rubs / gallops. No extremity edema.  Abdomen: no tenderness, no masses palpated. No hepatosplenomegaly. Bowel sounds positive.  Urine from pure wick appears very cloudy Musculoskeletal: no clubbing / cyanosis. No joint deformity upper and lower extremities. Skin: no rashes, lesions, ulcers. No induration Neurologic: Speech clear without evidence of aphasia, no facial asymmetry, 4+ by 5 strength in all extremities. Psychiatric: Sleeping but arousable, oriented to person and place but not situation.  Labs on Admission: I have personally reviewed following labs and imaging studies  CBC: Recent Labs  Lab 11/17/22 1419  WBC 6.7  NEUTROABS 3.5  HGB 14.1  HCT 41.5  MCV 95.4  PLT 300   Basic Metabolic Panel: Recent Labs  Lab 11/17/22 1419  NA 136  K 4.4  CL 109  CO2 20*  GLUCOSE 115*  BUN 22  CREATININE 1.34*  CALCIUM 9.9   Liver Function Tests: Recent Labs  Lab 11/17/22 1419  AST 17  ALT 14  ALKPHOS 95  BILITOT 0.8  PROT 6.8  ALBUMIN 3.9   Urine analysis:    Component Value Date/Time   COLORURINE AMBER (A) 11/17/2022 1418   APPEARANCEUR CLOUDY (A) 11/17/2022 1418   APPEARANCEUR Turbid (A) 01/20/2022 1450   LABSPEC 1.019 11/17/2022 1418   PHURINE 7.0 11/17/2022 1418   GLUCOSEU NEGATIVE 11/17/2022 1418   GLUCOSEU NEG mg/dL 09/20/2007 1114   HGBUR MODERATE (A) 11/17/2022 1418   HGBUR moderate 06/04/2010 1425   BILIRUBINUR NEGATIVE 11/17/2022 1418   BILIRUBINUR Negative 01/20/2022 1450   KETONESUR 5 (A) 11/17/2022 1418   PROTEINUR >=300 (A) 11/17/2022 1418   UROBILINOGEN 1.0 05/31/2014 0933   NITRITE POSITIVE (A) 11/17/2022 1418   LEUKOCYTESUR MODERATE (A) 11/17/2022 1418    Radiological Exams on Admission: No results found.  EKG: Independently reviewed.  Sinus rhythm, rate 71, QTc 446.  No significant  ST or T wave changes compared to prior.  Assessment/Plan Principal Problem:   UTI (urinary tract infection) Active  Problems:   HIV (human immunodeficiency virus infection) ([place])   Essential hypertension   CKD (chronic kidney disease) stage 3, GFR 30-59 ml/min ([facility])   Dementia ([facility])   Assessment and Plan: * UTI (urinary tract infection) Symptomatic with dysuria over the past week.  Afebrile without leukocytosis.  Lactic acid 1.2 > 0.7.  Rules out for sepsis.  History of multidrug-resistant UTI hence hospitalization requested.  Last urine cultures 01/07/2022 grew Proteus mirabilis resistant to multiple antibiotics, sensitive to imipenem, Zosyn and cefepime.  Treated with meropenem in the past - EDP consulted with pharmacy, started on meropenem -Follow-up urine cultures   HIV (human immunodeficiency virus infection) ([place]) Last viral load 04/2022- Undetectable, and CD4 count 571. -Resume Biktarvy  Essential hypertension Stable. -Resume losartan,  Dementia ([place]) On my evaluation she is awake alert oriented x 2.  Per nursing home, patient was not as talkative as she normally is this morning, otherwise mental status appears to be close to baseline. -Resume aripiprazole, Cymbalta,  CKD (chronic kidney disease) stage 3, GFR 30-59 ml/min ([facility]) CKD stage IIIb.  Creatinine 1.34.  Stable.   DVT prophylaxis: Heparin Code Status: DNR.  Confirmed with patient's daughter in law [employer], on the phone. Family Communication: None at bedside.  I talked to patient's daughter in law- [name], who is POA on the phone, plan of care explained. Disposition Plan: ~ 2 days Consults called: None Admission status: Obs Med surg   Author: Bethena Roys, MD 11/17/2022 6:41 PM  For on call review [email or web address].

## 2022-11-17 NOTE — Progress Notes (Signed)
Verbal report given to nurse assigned to this patient on unit 300 room 312.

## 2022-11-17 NOTE — Progress Notes (Signed)
Pharmacy Antibiotic Note  [name] is a 82 y.o. adult admitted on 11/17/2022 with UTI.  Pharmacy has been consulted for meropenem dosing.  Plan: Meropenem 1000 mg IV every 12 hours. Monitor labs, c/s, and patient improvement.   Height: '5\' 2"'$  (157.5 cm) Weight: 73.1 kg (161 lb 2.5 oz) IBW/kg (Calculated) : 50.1  Temp (24hrs), Avg:97.8 F (36.6 C), Min:97.8 F (36.6 C), Max:97.8 F (36.6 C)  Recent Labs  Lab 11/17/22 1419 11/17/22 1608  WBC 6.7  --   CREATININE 1.34*  --   LATICACIDVEN 1.2 0.7    Estimated Creatinine Clearance (by C-G formula based on SCr of 1.34 mg/dL (H)) Female: 30.8 mL/min (A) Female: 37.9 mL/min (A)    Allergies  Allergen Reactions   Bee Venom Anaphylaxis   Promethazine Swelling   Tenofovir Disoproxil Fumarate [Tenofovir Disoproxil Fumarate] Other (See Comments)    Renal failure, (08/27/2006)   Compazine  [Prochlorperazine Edisylate] Nausea And Vomiting   Haloperidol Lactate Other (See Comments)    Reaction is muscle tension, causes severe spasms in face and neck. Forces eyes to roll in the back of the head   Sulfa Antibiotics Diarrhea   Brompheniramine-Acetaminophen Other (See Comments)    unknown   Chlorcyclizine Other (See Comments)    Unknown   Chlorpromazine Other (See Comments)    Unknown   Codeine Itching and Nausea And Vomiting   Navane [Thiothixene (Tiotixene)] Other (See Comments) and Nausea And Vomiting    Same muscle spasm reaction as haldol   Prednisone Other (See Comments)    Brief mild psychosis and agitation   Promethazine Hcl Other (See Comments)   Propoxyphene N-Acetaminophen Itching and Nausea And Vomiting   Morphine Itching and Swelling    Antimicrobials this admission: Merrem 1/23 >>  Microbiology results: 1/23 UCx: pending  01/09/22 Ucx: ESBL proteus   Thank you for allowing pharmacy to be a part of this patient's care.  [name] 11/17/2022 5:17 PM

## 2022-11-17 NOTE — Assessment & Plan Note (Signed)
Stable. -Resume losartan,

## 2022-11-17 NOTE — ED Triage Notes (Signed)
Pt is HIV+ came from [facility] sent out for new onset of confusion, hx of confusion and schizophrenia. Hx copd, no O2 at baseline. VS WNL from EMS.Per EMS, pt's roommate said pt been complainign the last week when urinating, pt has hx of UTIs T98.0. BP 144/76,P 69, 96 RA [id number]

## 2022-11-17 NOTE — Assessment & Plan Note (Signed)
On my evaluation she is awake alert oriented x 2.  Per nursing home, patient was not as talkative as she normally is this morning, otherwise mental status appears to be close to baseline. -Resume aripiprazole, Cymbalta,

## 2022-11-17 NOTE — ED Provider Notes (Signed)
[facility] Provider Note   CSN: [id number] Arrival date & time: 11/17/22  1314     History Chief Complaint  Patient presents with   Altered Mental Status    [name] is a 82 y.o. adult with h/o dementia, HIV, recurrent UTIs, schizophrenia, presents the ER for evaluation of dysuria for the past 1 to 2 weeks.  Initially the nursing note mentions that the patient is here for altered mental status.  I called and spoke with the patient's nursing home at [facility] and spoke with the nurse taking care of her.  The nurse reports to me that she called because the systolic blood pressure was in the 160s and that she has been complaining of pain with urination and was also not as talkative this morning as she usually is.  The patient is alert and oriented x 4.  She denies any abdominal pain.  Mentions that she has been having some pain in her vagina when she urinates.  She denies any abdominal pain or fevers.  She mentions that occasionally she gets some nausea but is still able to eat and drink.  Denies any vomiting.  Denies any diarrhea or constipation.   Altered Mental Status Associated symptoms: nausea   Associated symptoms: no abdominal pain, no fever and no vomiting        Home Medications Prior to Admission medications   Medication Sig Start Date End Date Taking? Authorizing Provider  ALLERGY RELIEF 10 MG tablet Take 10 mg by mouth daily. 05/02/21  Yes [provider]  ARIPiprazole (ABILIFY) 5 MG tablet Take 5 mg by mouth daily. 10/17/21  Yes [provider]  atorvastatin (LIPITOR) 20 MG tablet Take 20 mg by mouth daily.   Yes [provider]  bictegravir-emtricitabine-tenofovir AF (BIKTARVY) 50-200-25 MG TABS tablet Take 1 tablet by mouth daily. Please discontinue TRIUMEQ and TIVICAY 05/25/22  Yes [name], Lavell Islam, MD  cephALEXin (KEFLEX) 250 MG capsule Take 250 mg by mouth at bedtime.   Yes [provider]  clonazePAM (KLONOPIN) 0.5 MG tablet Take 0.5 mg by mouth 2 (two) times daily. 11/16/22  Yes [provider]  Cranberry 250 MG TABS Take 450 mg by mouth every morning.   Yes [provider]  docusate sodium (COLACE) 100 MG capsule Take 1 capsule (100 mg total) by mouth 2 (two) times daily. 11/21/18  Yes Virl Cagey, MD  DULoxetine (CYMBALTA) 60 MG capsule Take 60 mg by mouth daily. 02/28/21  Yes [provider]  fluconazole (DIFLUCAN) 150 MG tablet Take 150 mg by mouth See admin instructions. Give 1 tablet by mouth one time a day every 3 days for 3 administrations. Start 11/12/22   Yes [provider]  fluticasone (FLONASE) 50 MCG/ACT nasal spray 2 SPRAYS INTO BOTH NOSTRILS AT BEDTIME. Patient taking differently: Place 2 sprays into both nostrils at bedtime. 05/22/19  Yes [name], Lavell Islam, MD  furosemide (LASIX) 40 MG tablet Take 1 tablet (40 mg total) by mouth daily as needed for fluid. Hold until follow up with PCP Patient taking differently: Take 40 mg by mouth daily as needed for fluid. 12/31/19  Yes Barton Dubois, MD  losartan (COZAAR) 25 MG tablet Take 12.5 mg by mouth daily. 05/02/21  Yes [provider]  Magnesium 100 MG TABS Take 400 mg by mouth daily.   Yes [provider]  Multiple Vitamin (ONE-A-DAY 55 PLUS PO) Take 1 tablet by mouth  daily.   Yes [provider]  MYRBETRIQ 25 MG TB24 tablet Take 25 mg by mouth daily. 11/08/22  Yes [provider]  oxymetazoline (AFRIN) 0.05 % nasal spray Place 1 spray into both nostrils every 12 (twelve) hours as needed for congestion (Nose Bleeds).   Yes [provider]  pantoprazole (PROTONIX) 40 MG tablet Take 1 tablet (40 mg total) by mouth daily before breakfast. 07/16/20 [birth year] Yes Harper, Tivis Ringer, PA-C  polyethylene glycol (MIRALAX) 17 g packet Take 17 g by mouth daily. Hold for diarrhea 06/26/21  Yes Barton Dubois, MD  polyvinyl alcohol (LIQUIFILM  TEARS) 1.4 % ophthalmic solution Place 1 drop into both eyes daily.   Yes [provider]  senna (SENOKOT) 8.6 MG tablet Take 1 tablet by mouth 2 (two) times daily.   Yes [provider]  thiamine (VITAMIN B-1) 100 MG tablet Take 100 mg by mouth daily.   Yes [provider]  clonazePAM (KLONOPIN) 1 MG tablet Take 1 tablet (1 mg total) by mouth 2 (two) times daily as needed for up to 3 days for anxiety. Patient not taking: Reported on 11/17/2022 01/09/22 02/03/49  Terrilee Croak, MD  feeding supplement (ENSURE ENLIVE / ENSURE PLUS) LIQD Take 237 mLs by mouth 2 (two) times daily between meals. 01/09/22   Terrilee Croak, MD  Menthol-Zinc Oxide 0.44-20.6 % OINT Apply 1 application. topically every 8 (eight) hours as needed (protection). Apply to buttocks    [provider]      Allergies    Bee venom, Promethazine, Tenofovir disoproxil fumarate [tenofovir disoproxil fumarate], Compazine  [prochlorperazine edisylate], Haloperidol lactate, Sulfa antibiotics, Brompheniramine-acetaminophen, Chlorcyclizine, Chlorpromazine, Codeine, Navane [thiothixene (tiotixene)], Prednisone, Promethazine hcl, Propoxyphene n-acetaminophen, and Morphine    Review of Systems   Review of Systems  Constitutional:  Negative for chills and fever.  Gastrointestinal:  Positive for nausea. Negative for abdominal pain, constipation, diarrhea and vomiting.  Genitourinary:  Positive for dysuria.    Physical Exam Updated Vital Signs BP (!) 140/67   Pulse 67   Temp 97.8 F (36.6 C) (Oral)   Resp 19   Ht '5\' 2"'$  (1.575 m)   Wt 73.1 kg   SpO2 96%   BMI 29.48 kg/m  Physical Exam Vitals and nursing note reviewed.  Constitutional:      General: She is not in acute distress.    Appearance: Normal appearance. She is not toxic-appearing.     Comments: Alert and oriented x 4.  Pleasant.  Maintains eye contact.  Answers questions appropriately with appropriate speech.  HENT:     Head: Normocephalic  and atraumatic.     Mouth/Throat:     Mouth: Mucous membranes are moist.  Eyes:     General: No scleral icterus.    Extraocular Movements: Extraocular movements intact.     Pupils: Pupils are equal, round, and reactive to light.  Cardiovascular:     Rate and Rhythm: Normal rate and regular rhythm.  Pulmonary:     Effort: Pulmonary effort is normal. No respiratory distress.     Breath sounds: Normal breath sounds.  Abdominal:     General: Bowel sounds are normal. There is no distension.     Palpations: Abdomen is soft.     Tenderness: There is no abdominal tenderness. There is no guarding or rebound.  Musculoskeletal:        General: No deformity.     Cervical back: Normal range of motion.  Skin:    General: Skin is warm and  dry.  Neurological:     General: No focal deficit present.     Mental Status: She is alert. Mental status is at baseline.     Cranial Nerves: No cranial nerve deficit.     ED Results / Procedures / Treatments   Labs (all labs ordered are listed, but only abnormal results are displayed) Labs Reviewed  COMPREHENSIVE METABOLIC PANEL - Abnormal; Notable for the following components:      Result Value   CO2 20 (*)    Glucose, Bld 115 (*)    Creatinine, Ser 1.34 (*)    GFR, Estimated 40 (*)    All other components within normal limits  URINALYSIS, ROUTINE W REFLEX MICROSCOPIC - Abnormal; Notable for the following components:   Color, Urine AMBER (*)    APPearance CLOUDY (*)    Hgb urine dipstick MODERATE (*)    Ketones, ur 5 (*)    Protein, ur >=300 (*)    Nitrite POSITIVE (*)    Leukocytes,Ua MODERATE (*)    RBC / HPF >50 (*)    WBC, UA >50 (*)    Bacteria, UA MANY (*)    All other components within normal limits  URINE CULTURE  CBC WITH DIFFERENTIAL/PLATELET  LACTIC ACID, PLASMA  LACTIC ACID, PLASMA    EKG EKG Interpretation  Date/Time:  Tuesday November 17 2022 13:30:27 EST Ventricular Rate:  71 PR Interval:  159 QRS Duration: 87 QT  Interval:  410 QTC Calculation: 446 R Axis:   36 Text Interpretation: Sinus rhythm Low voltage, precordial leads Baseline wander in lead(s) I II aVR aVF Artifact Abnormal ECG Confirmed by Carmin Muskrat [phone]) on 11/17/2022 1:43:45 PM  Radiology No results found.  Procedures Procedures   Medications Ordered in ED Medications  meropenem (MERREM) 1 g in sodium chloride 0.9 % 100 mL IVPB (1 g Intravenous New Bag/Given 11/17/22 1753)    ED Course/ Medical Decision Making/ A&P                           Medical Decision Making Amount and/or Complexity of Data Reviewed Labs: ordered.  Risk Decision regarding hospitalization.   82 year old female presents emergency room today for evaluation of dysuria.  Differential diagnosis includes was not limited to UTI versus cystitis versus pyelonephritis versus urethritis versus vaginitis.  Vital signs show mildly elevated blood pressure 140/67 otherwise afebrile, normal pulse rate, satting well on room air without increased work of breathing.  Physical exam as noted above.  Suspect UTI.  Patient is alert and oriented x 4.  She reports that she has been had pain with urination for the past 1 to 2 weeks.  She reports a mild nausea but denies any abdominal pain.  After speaking with the patient's nurse at her nursing home, this is not true altered mental status, I do not think any need to do a CT on the patient's head at this time.  I independently reviewed and interpreted the patient's labs.  CBC without cytosis or anemia.  CMP shows mildly decreased bicarb at 20 with glucose of 115 and creatinine 1.34 appears around patient's baseline.  Lactic acid normal at 0.7.  Patient's urinalysis consistent with infection.  It is amber in color and cloudy.  There is mod amount of hemoglobin with 5 ketones greater than 300 protein.  It is nitrate positive with moderate leukocytes with greater than 50 white blood cells and greater than 50 red blood cells with many  bacteria.  There is also white blood close cell clumps present.  On chart investigation, the patient last had a UTI that required IV meropenem.  She has a history of recurrent UTIs that are multidrug resistance.  She last saw her urologist on 07-20-2022 for urinary retention and recurrent UTIs.  I consulted pharmacist Colletta [place] for potential medications.  She reports that meropenem evaluate the best drug given her resistance in the past.  Meropenem ordered per pharmacy place.  Given the patient's multidrug-resistant UTIs consistently, she does not qualify for any outpatient medications will need to be admitted for IV antibiotics.  At this time, she is not meeting sepsis criteria.  She does have a white count lactic acid is normal, her creatinine is in the normal limits, and her vitals are reassuring.  Will admit to hospitalist.  Final Clinical Impression(s) / ED Diagnoses Final diagnoses:  Acute cystitis with hematuria    Rx / DC Orders ED Discharge Orders     None         Sherrell Puller, Hershal Coria 11/17/22 1831    Carmin Muskrat, MD 11/18/22 [phone]

## 2022-11-17 NOTE — Assessment & Plan Note (Signed)
CKD stage IIIb.  Creatinine 1.34.  Stable.

## 2022-11-18 DIAGNOSIS — Z8673 Personal history of transient ischemic attack (TIA), and cerebral infarction without residual deficits: Secondary | ICD-10-CM | POA: Diagnosis not present

## 2022-11-18 DIAGNOSIS — Z21 Asymptomatic human immunodeficiency virus [HIV] infection status: Secondary | ICD-10-CM | POA: Diagnosis present

## 2022-11-18 DIAGNOSIS — G629 Polyneuropathy, unspecified: Secondary | ICD-10-CM | POA: Diagnosis present

## 2022-11-18 DIAGNOSIS — N390 Urinary tract infection, site not specified: Secondary | ICD-10-CM | POA: Diagnosis present

## 2022-11-18 DIAGNOSIS — N3001 Acute cystitis with hematuria: Secondary | ICD-10-CM | POA: Diagnosis not present

## 2022-11-18 DIAGNOSIS — Z885 Allergy status to narcotic agent status: Secondary | ICD-10-CM | POA: Diagnosis not present

## 2022-11-18 DIAGNOSIS — B964 Proteus (mirabilis) (morganii) as the cause of diseases classified elsewhere: Secondary | ICD-10-CM | POA: Diagnosis present

## 2022-11-18 DIAGNOSIS — Z1624 Resistance to multiple antibiotics: Secondary | ICD-10-CM | POA: Diagnosis present

## 2022-11-18 DIAGNOSIS — Z91030 Bee allergy status: Secondary | ICD-10-CM | POA: Diagnosis not present

## 2022-11-18 DIAGNOSIS — F0390 Unspecified dementia without behavioral disturbance: Secondary | ICD-10-CM | POA: Diagnosis present

## 2022-11-18 DIAGNOSIS — Z888 Allergy status to other drugs, medicaments and biological substances status: Secondary | ICD-10-CM | POA: Diagnosis not present

## 2022-11-18 DIAGNOSIS — N1832 Chronic kidney disease, stage 3b: Secondary | ICD-10-CM | POA: Diagnosis present

## 2022-11-18 DIAGNOSIS — G9341 Metabolic encephalopathy: Secondary | ICD-10-CM | POA: Diagnosis present

## 2022-11-18 DIAGNOSIS — F203 Undifferentiated schizophrenia: Secondary | ICD-10-CM | POA: Diagnosis present

## 2022-11-18 DIAGNOSIS — F32A Depression, unspecified: Secondary | ICD-10-CM | POA: Diagnosis present

## 2022-11-18 DIAGNOSIS — I129 Hypertensive chronic kidney disease with stage 1 through stage 4 chronic kidney disease, or unspecified chronic kidney disease: Secondary | ICD-10-CM | POA: Diagnosis present

## 2022-11-18 DIAGNOSIS — Z79899 Other long term (current) drug therapy: Secondary | ICD-10-CM | POA: Diagnosis not present

## 2022-11-18 DIAGNOSIS — Z833 Family history of diabetes mellitus: Secondary | ICD-10-CM | POA: Diagnosis not present

## 2022-11-18 DIAGNOSIS — Z882 Allergy status to sulfonamides status: Secondary | ICD-10-CM | POA: Diagnosis not present

## 2022-11-18 DIAGNOSIS — J449 Chronic obstructive pulmonary disease, unspecified: Secondary | ICD-10-CM | POA: Diagnosis present

## 2022-11-18 DIAGNOSIS — Z66 Do not resuscitate: Secondary | ICD-10-CM | POA: Diagnosis present

## 2022-11-18 DIAGNOSIS — F419 Anxiety disorder, unspecified: Secondary | ICD-10-CM | POA: Diagnosis present

## 2022-11-18 LAB — CBC
HCT: 42.6 % (ref 36.0–46.0)
Hemoglobin: 14.3 g/dL (ref 12.0–15.0)
MCH: 32 pg (ref 26.0–34.0)
MCHC: 33.6 g/dL (ref 30.0–36.0)
MCV: 95.3 fL (ref 80.0–100.0)
Platelets: 219 10*3/uL (ref 150–400)
RBC: 4.47 MIL/uL (ref 3.87–5.11)
RDW: 13.2 % (ref 11.5–15.5)
WBC: 6.3 10*3/uL (ref 4.0–10.5)
nRBC: 0 % (ref 0.0–0.2)

## 2022-11-18 LAB — BASIC METABOLIC PANEL
Anion gap: 7 (ref 5–15)
BUN: 21 mg/dL (ref 8–23)
CO2: 22 mmol/L (ref 22–32)
Calcium: 10.1 mg/dL (ref 8.9–10.3)
Chloride: 111 mmol/L (ref 98–111)
Creatinine, Ser: 1.22 mg/dL — ABNORMAL HIGH (ref 0.44–1.00)
GFR, Estimated: 45 mL/min — ABNORMAL LOW (ref >60)
Glucose, Bld: 93 mg/dL (ref 70–99)
Potassium: 4.1 mmol/L (ref 3.5–5.1)
Sodium: 140 mmol/L (ref 135–145)

## 2022-11-18 MED ORDER — SODIUM CHLORIDE 0.9 % IV SOLN: INTRAVENOUS | Status: DC | PRN

## 2022-11-18 NOTE — Progress Notes (Signed)
This nurse along with assistance from [place] Early, LPN tried to assist pt to the recliner to have supper and sit up. Pt refused several times, I educated the pt on the importance of mobility and asked if she would sit up on the side of the bed, pt states "not now, I'm too weak".

## 2022-11-18 NOTE — TOC Initial Note (Signed)
Transition of Care [facility]) - Initial/Assessment Note    Patient Details  Name: [name] MRN: [id number] Date of Birth: [birth year]  Transition of Care [facility]) CM/SW Contact:    Shade Flood, LCSW Phone Number: 11/18/2022, 11:10 AM  Clinical Narrative:                  Pt admitted from [facility] long term care. Plan is for return there at dc. Updated Debbie at [facility].  TOC will follow and assist as needed with dc planning.  Expected Discharge Plan: Long Term Nursing Home Barriers to Discharge: Continued Medical Work up   Patient Goals and CMS Choice Patient states their goals for this hospitalization and ongoing recovery are:: return to facility   Choice offered to / list presented to : Patient      Expected Discharge Plan and Services In-house Referral: Clinical Social Work   Post Acute Care Choice: Resumption of Svcs/PTA Provider Living arrangements for the past 2 months: [place]                                      Prior Living Arrangements/Services Living arrangements for the past 2 months: [place] Lives with:: Facility Resident Patient language and need for interpreter reviewed:: Yes Do you feel safe going back to the place where you live?: Yes      Need for Family Participation in Patient Care: No (Comment) Care giver support system in place?: Yes (comment)   Criminal Activity/Legal Involvement Pertinent to Current Situation/Hospitalization: No - Comment as needed  Activities of Daily Living Home Assistive Devices/Equipment: Wheelchair, Environmental consultant (specify type) ADL Screening (condition at time of admission) Patient's cognitive ability adequate to safely complete daily activities?: No Is the patient deaf or have difficulty hearing?: No Does the patient have difficulty seeing, even when wearing glasses/contacts?: No Does the patient have difficulty concentrating, remembering, or making decisions?:  Yes Patient able to express need for assistance with ADLs?: Yes Does the patient have difficulty dressing or bathing?: Yes Independently performs ADLs?: No Communication: Independent Dressing (OT): Needs assistance Is this a change from baseline?: Pre-admission baseline Grooming: Needs assistance Is this a change from baseline?: Pre-admission baseline Feeding: Independent Bathing: Needs assistance Is this a change from baseline?: Pre-admission baseline Toileting: Needs assistance Is this a change from baseline?: Pre-admission baseline In/Out Bed: Needs assistance Is this a change from baseline?: Pre-admission baseline Walks in Home: Needs assistance Is this a change from baseline?: Pre-admission baseline Does the patient have difficulty walking or climbing stairs?: Yes Weakness of Legs: Both Weakness of Arms/Hands: Both  Permission Sought/Granted                  Emotional Assessment       Orientation: : Oriented to Self, Oriented to Place Alcohol / Substance Use: Not Applicable Psych Involvement: No (comment)  Admission diagnosis:  UTI (urinary tract infection) [N39.0] Acute cystitis with hematuria [N30.01] Patient Active Problem List   Diagnosis Date Noted   Pelvic pain 05/25/2022   HIV infection with neurological disease ([place]) 05/25/2022   HIV disease ([place]) 01/26/2022   Incomplete bladder emptying 01/20/2022   Urinary retention 01/20/2022   Hypotension 01/06/2022   Low blood pressure 01/05/2022   Dementia ([place]) 01/05/2022   Confusion 01/05/2022   Dizziness 01/05/2022   Acute cystitis without hematuria 01/05/2022   Abdominal pain 06/21/2021   Hypercalcemia 06/21/2021  Serum total bilirubin elevated 06/21/2021   History of schizophrenia 06/21/2021   SBO (small bowel obstruction) (HCC) 06/21/2021   Generalized weakness 05/10/2021   Elevated MCV 05/10/2021   Moderate single current episode of major depressive disorder ([place]) 03/12/2021   Incontinence of  urine in female 11/21/2020   Skin yeast infection 11/21/2020   Mass of left side of neck 09/12/2020   Colostomy in place [facility]) 09/12/2020   Housing problems 06/06/2020   Healthcare maintenance 06/06/2020   Abdominal wall bulge 04/22/2020   AKI (acute kidney injury) ([place])    History of colonic polyps 07/06/2019   History of partial colectomy 07/06/2019   Seasonal allergies 03/27/2019   Dysuria 03/27/2019   Obesity (BMI 30-39.9) 11/12/2018   Constipation due to opioid therapy 11/12/2018   Lactic acidosis 11/12/2018   Large bowel perforation ([place]) 11/12/2018   Peritonitis ([place]) 11/12/2018   Stercoral ulcer of large intestine 11/12/2018   Pneumoperitoneum    COPD (chronic obstructive pulmonary disease) ([place]) 12/17/2017   Hypothyroidism 12/17/2017   Steroid-induced psychosis, with hallucinations ([place])    UTI (urinary tract infection) 03/03/2017   Nausea & vomiting 03/03/2017   Weight loss 03/03/2017   CAP (community acquired pneumonia) 07/22/2016   CKD (chronic kidney disease) stage 3, GFR 30-59 ml/min (HCC) 05/04/2016   Elevated LFTs 05/04/2016   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   Breath shortness 06/19/2014   Syncope 05/31/2014   Rib fracture 05/31/2014   Weight gain 03/21/2014   Cervical spondylosis 08/15/2013   Neck pain on left side 05/24/2013   Unspecified vitamin D deficiency 12/14/2012   Vitamin D toxicity 12/14/2012   Hemorrhoid 09/01/2012   Myalgia 02/25/2012   Essential hypertension 11/25/2011   Cramps, muscle, general 08/18/2011   RECTAL BLEEDING 11/03/2010   Esophageal dysphagia 11/03/2010   FATIGUE 08/29/2010   MUSCLE PAIN 06/04/2010   Cystitis 05/12/2010   RENAL INSUFFICIENCY 12/18/2008   DEPRESSION 09/04/2008   HLD (hyperlipidemia) 05/22/2008   IRRITABLE BOWEL SYNDROME 05/22/2008   HERNIATED CERVICAL DISC 10/05/2007   HERNIATED LUMBAR DISC 10/05/2007   History of urinary tract infection 09/20/2007   HIV-1 associated autonomic neuropathy ([place]) 03/16/2007    ELBOW PAIN 03/16/2007   ISCHEMIC COLITIS 12/27/2006   Acute kidney injury superimposed on CKD ([place]) 12/27/2006   HIV (human immunodeficiency virus infection) ([place]) 08/27/2006   PCP:  Caprice Renshaw, MD Pharmacy:   [facility] [place] [address] [place] [address] Phone: [phone] Fax: [phone]     Social Determinants of Health (SDOH) Social History: SDOH Screenings   Depression (PHQ2-9): Low Risk  (01/05/2022)  Tobacco Use: Low Risk  (05/25/2022)   SDOH Interventions:     Readmission Risk Interventions    06/23/2021   11:18 AM 05/11/2021    3:21 PM  Readmission Risk Prevention Plan  Transportation Screening Complete Complete  PCP or Specialist Appt within 3-5 Days  Complete  HRI or Home Care Consult Complete Complete  Social Work Consult for [place] Planning/Counseling Complete Complete  Palliative Care Screening Not Applicable Not Applicable  Medication Review Press photographer) Complete Complete

## 2022-11-18 NOTE — NC FL2 (Signed)
[place] LEVEL OF CARE FORM     IDENTIFICATION  Patient Name: [name] Birthdate: 1941/05/17 Sex: adult Admission Date (Current Location): 11/17/2022  [facility] and [place] Number:  [employer] and Address:  [place] [address], [place]      Provider Number: [id number]  Attending Physician Name and Address:  Rodena Goldmann, DO  Relative Name and Phone Number:       Current Level of Care: Hospital Recommended Level of Care: [place] Prior Approval Number:    Date Approved/Denied:   PASRR Number:    Discharge Plan: SNF    Current Diagnoses: Patient Active Problem List   Diagnosis Date Noted   Pelvic pain 05/25/2022   HIV infection with neurological disease ([place]) 05/25/2022   HIV disease ([place]) 01/26/2022   Incomplete bladder emptying 01/20/2022   Urinary retention 01/20/2022   Hypotension 01/06/2022   Low blood pressure 01/05/2022   Dementia ([place]) 01/05/2022   Confusion 01/05/2022   Dizziness 01/05/2022   Acute cystitis without hematuria 01/05/2022   Abdominal pain 06/21/2021   Hypercalcemia 06/21/2021   Serum total bilirubin elevated 06/21/2021   History of schizophrenia 06/21/2021   SBO (small bowel obstruction) ([place]) 06/21/2021   Generalized weakness 05/10/2021   Elevated MCV 05/10/2021   Moderate single current episode of major depressive disorder ([place]) 03/12/2021   Incontinence of urine in female 11/21/2020   Skin yeast infection 11/21/2020   Mass of left side of neck 09/12/2020   Colostomy in place [facility]) 09/12/2020   Housing problems 06/06/2020   Healthcare maintenance 06/06/2020   Abdominal wall bulge 04/22/2020   AKI (acute kidney injury) ([place])    History of colonic polyps 07/06/2019   History of partial colectomy 07/06/2019   Seasonal allergies 03/27/2019   Dysuria 03/27/2019   Obesity (BMI 30-39.9) 11/12/2018   Constipation due to opioid therapy 11/12/2018   Lactic  acidosis 11/12/2018   Large bowel perforation ([place]) 11/12/2018   Peritonitis ([place]) 11/12/2018   Stercoral ulcer of large intestine 11/12/2018   Pneumoperitoneum    COPD (chronic obstructive pulmonary disease) ([place]) 12/17/2017   Hypothyroidism 12/17/2017   Steroid-induced psychosis, with hallucinations ([place])    UTI (urinary tract infection) 03/03/2017   Nausea & vomiting 03/03/2017   Weight loss 03/03/2017   CAP (community acquired pneumonia) 07/22/2016   CKD (chronic kidney disease) stage 3, GFR 30-59 ml/min (HCC) 05/04/2016   Elevated LFTs 05/04/2016   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   Breath shortness 06/19/2014   Syncope 05/31/2014   Rib fracture 05/31/2014   Weight gain 03/21/2014   Cervical spondylosis 08/15/2013   Neck pain on left side 05/24/2013   Unspecified vitamin D deficiency 12/14/2012   Vitamin D toxicity 12/14/2012   Hemorrhoid 09/01/2012   Myalgia 02/25/2012   Essential hypertension 11/25/2011   Cramps, muscle, general 08/18/2011   RECTAL BLEEDING 11/03/2010   Esophageal dysphagia 11/03/2010   FATIGUE 08/29/2010   MUSCLE PAIN 06/04/2010   Cystitis 05/12/2010   RENAL INSUFFICIENCY 12/18/2008   DEPRESSION 09/04/2008   HLD (hyperlipidemia) 05/22/2008   IRRITABLE BOWEL SYNDROME 05/22/2008   HERNIATED CERVICAL [place] 10/05/2007   HERNIATED LUMBAR [place] 10/05/2007   History of urinary tract infection 09/20/2007   HIV-1 associated autonomic neuropathy ([place]) 03/16/2007   ELBOW PAIN 03/16/2007   ISCHEMIC COLITIS 12/27/2006   Acute kidney injury superimposed on CKD ([place]) 12/27/2006   HIV (human immunodeficiency virus infection) ([place] [address]) 08/27/2006    Orientation RESPIRATION BLADDER Height &  Weight     Self, Place  Normal Incontinent Weight: 146 lb 6.2 oz (66.4 kg) Height:  '5\' 2"'$  (157.5 cm)  BEHAVIORAL SYMPTOMS/MOOD NEUROLOGICAL BOWEL NUTRITION STATUS      Incontinent Diet (see dc summary)  AMBULATORY STATUS COMMUNICATION OF NEEDS Skin   Extensive Assist Verbally  Normal                       Personal Care Assistance Level of Assistance  Bathing, Feeding, Dressing Bathing Assistance: Limited assistance Feeding assistance: Independent Dressing Assistance: Limited assistance     Functional Limitations Info  Sight, Hearing, Speech Sight Info: Impaired Hearing Info: Adequate Speech Info: Adequate    SPECIAL CARE FACTORS FREQUENCY                       Contractures Contractures Info: Not present    Additional Factors Info  Code Status, Allergies, Psychotropic Code Status Info: DNR Allergies Info: Bee Venom, Promethazine, Tenofovir Disoproxil Fumarate (Tenofovir Disoproxil Fumarate), Compazine  (Prochlorperazine Edisylate), Haloperidol Lactate, Sulfa Antibiotics, Brompheniramine-acetaminophen, Chlorcyclizine, Chlorpromazine, Codeine, Navane (Thiothixene (Tiotixene)), Prednisone, Promethazine Hcl, Propoxyphene N-acetaminophen, Morphine Psychotropic Info: Abilify, Cymbalta, Klonopin         Current Medications (11/18/2022):  This is the current hospital active medication list Current Facility-Administered Medications  Medication Dose Route Frequency Provider Last Rate Last Admin   0.9 %  sodium chloride infusion   Intravenous PRN Zierle-Ghosh, Asia B, DO 10 mL/hr at 11/18/22 0519 New Bag at 11/18/22 0519   acetaminophen (TYLENOL) tablet 650 mg  650 mg Oral Q6H PRN Emokpae, Ejiroghene E, MD       Or   acetaminophen (TYLENOL) suppository 650 mg  650 mg Rectal Q6H PRN Emokpae, Ejiroghene E, MD       ARIPiprazole (ABILIFY) tablet 5 mg  5 mg Oral Daily Emokpae, Ejiroghene E, MD   5 mg at 11/18/22 0916   bictegravir-emtricitabine-tenofovir AF (BIKTARVY) 50-200-25 MG per tablet 1 tablet  1 tablet Oral Daily Emokpae, Ejiroghene E, MD   1 tablet at 11/18/22 1001   clonazePAM (KLONOPIN) tablet 0.5 mg  0.5 mg Oral BID Emokpae, Ejiroghene E, MD   0.5 mg at 11/18/22 0916   docusate sodium (COLACE) capsule 100 mg  100 mg Oral BID Emokpae,  Ejiroghene E, MD   100 mg at 11/18/22 0916   DULoxetine (CYMBALTA) DR capsule 60 mg  60 mg Oral Daily Emokpae, Ejiroghene E, MD   60 mg at 11/18/22 0916   heparin injection 5,000 Units  5,000 Units Subcutaneous Q8H Emokpae, Ejiroghene E, MD   5,000 Units at 11/18/22 0516   losartan (COZAAR) tablet 12.5 mg  12.5 mg Oral Daily Emokpae, Ejiroghene E, MD   12.5 mg at 11/18/22 0916   meropenem (MERREM) 1 g in sodium chloride 0.9 % 100 mL IVPB  1 g Intravenous Q12H Emokpae, Ejiroghene E, MD 200 mL/hr at 11/18/22 0521 1 g at 11/18/22 0521   ondansetron (ZOFRAN) tablet 4 mg  4 mg Oral Q6H PRN Emokpae, Ejiroghene E, MD       Or   ondansetron (ZOFRAN) injection 4 mg  4 mg Intravenous Q6H PRN Emokpae, Ejiroghene E, MD       pantoprazole (PROTONIX) EC tablet 40 mg  40 mg Oral QAC breakfast Emokpae, Ejiroghene E, MD   40 mg at 11/18/22 0916   polyethylene glycol (MIRALAX / GLYCOLAX) packet 17 g  17 g Oral Daily PRN Emokpae, Leanne Chang, MD  senna (SENOKOT) tablet 8.6 mg  1 tablet Oral BID Emokpae, Ejiroghene E, MD   8.6 mg at 11/18/22 4327     Discharge Medications: Please see discharge summary for a list of discharge medications.  Relevant Imaging Results:  Relevant Lab Results:   Additional Information    Shade Flood, LCSW

## 2022-11-18 NOTE — Progress Notes (Signed)
Ok to collect HIV VL while here to get update and for next ID clinic per Dr. Manuella Ghazi.  Onnie Boer, PharmD, BCIDP, AAHIVP, CPP Infectious Disease Pharmacist 11/18/2022 6:05 PM

## 2022-11-18 NOTE — Progress Notes (Signed)
PROGRESS NOTE    TEVIS CONGER  GUR:[id number] DOB: 10/02/1941 DOA: 11/17/2022 PCP: Caprice Renshaw, MD   Brief Narrative:  Marthella Osorno Hector is a 82 y.o. adult with medical history significant for COPD, CKD 3, dementia, HIV,HTN. Patient was sent to the ED from nursing home with reports of confusion.  She was also noted to have burning sensation with urination.  She was admitted with acute metabolic encephalopathy secondary to UTI and has known prior history of multidrug-resistant UTI and therefore patient started on Merrem empirically with urine cultures pending.  Assessment & Plan:   Principal Problem:   UTI (urinary tract infection) Active Problems:   HIV (human immunodeficiency virus infection) ([place])   Essential hypertension   CKD (chronic kidney disease) stage 3, GFR 30-59 ml/min (HCC)   Dementia (HCC)  Assessment and Plan:  UTI (urinary tract infection) Symptomatic with dysuria over the past week.  Afebrile without leukocytosis.  Lactic acid 1.2 > 0.7.  Rules out for sepsis.  History of multidrug-resistant UTI hence hospitalization requested.  Last urine cultures 01/07/2022 grew Proteus mirabilis resistant to multiple antibiotics, sensitive to imipenem, Zosyn and cefepime.  Treated with meropenem in the past - EDP consulted with pharmacy, started on meropenem -Follow-up urine cultures     HIV (human immunodeficiency virus infection) ([place]) Last viral load 04/2022- Undetectable, and CD4 count 571. -Resume Biktarvy   Essential hypertension Stable. -Resume losartan,   Dementia ([place]) On my evaluation she is awake alert oriented x 2.  Per nursing home, patient was not as talkative as she normally is this morning, otherwise mental status appears to be close to baseline. -Resume aripiprazole, Cymbalta,   CKD (chronic kidney disease) stage 3, GFR 30-59 ml/min (HCC) CKD stage IIIb.   Continue to monitor, stable creatinine level   DVT prophylaxis:Heparin Code Status: DNR Family  Communication: None at bedside Disposition Plan:  Status is: Observation The patient will require care spanning > 2 midnights and should be moved to inpatient because: Need for IV antibiotics.   Consultants:  None  Procedures:  None  Antimicrobials:  Anti-infectives (From admission, onward)    Start     Dose/Rate Route Frequency Ordered Stop   11/18/22 1000  bictegravir-emtricitabine-tenofovir AF (BIKTARVY) 50-200-25 MG per tablet 1 tablet        1 tablet Oral Daily 11/17/22 2051     11/17/22 1730  meropenem (MERREM) 1 g in sodium chloride 0.9 % 100 mL IVPB        1 g 200 mL/hr over 30 Minutes Intravenous Every 12 hours 11/17/22 1716         Subjective: Patient seen and evaluated today with mild confusion still noted, she does not know the year.  No acute concerns or events noted overnight.  Objective: Vitals:   11/17/22 1930 11/17/22 2033 11/18/22 0136 11/18/22 0539  BP: (!) 146/79 138/82 120/65 (!) 141/64  Pulse: 72 70 67 67  Resp: '14 17 18 17  '$ Temp: 97.6 F (36.4 C) 98.1 F (36.7 C) 98.3 F (36.8 C) 98.2 F (36.8 C)  TempSrc: Oral Oral    SpO2: 100% 97%  99%  Weight:  66.4 kg    Height:  '5\' 2"'$  (1.575 m)      Intake/Output Summary (Last 24 hours) at 11/18/2022 1002 Last data filed at 11/18/2022 0900 Gross per 24 hour  Intake 559.45 ml  Output 400 ml  Net 159.45 ml   Filed Weights   11/17/22 1337 11/17/22 2033  Weight: 73.1 kg 66.4  kg    Examination:  General exam: Appears calm and comfortable, mildly confused Respiratory system: Clear to auscultation. Respiratory effort normal. Cardiovascular system: S1 & S2 heard, RRR.  Gastrointestinal system: Abdomen is soft Central nervous system: Alert and awake Extremities: No edema Skin: No significant lesions noted Psychiatry: Flat affect.    Data Reviewed: I have personally reviewed following labs and imaging studies  CBC: Recent Labs  Lab 11/17/22 1419 11/18/22 0446  WBC 6.7 6.3  NEUTROABS 3.5   --   HGB 14.1 14.3  HCT 41.5 42.6  MCV 95.4 95.3  PLT 216 852   Basic Metabolic Panel: Recent Labs  Lab 11/17/22 1419 11/18/22 0446  NA 136 140  K 4.4 4.1  CL 109 111  CO2 20* 22  GLUCOSE 115* 93  BUN 22 21  CREATININE 1.34* 1.22*  CALCIUM 9.9 10.1   GFR: Estimated Creatinine Clearance (by C-G formula based on SCr of 1.22 mg/dL (H)) Female: 32.3 mL/min (A) Female: 39.8 mL/min (A) Liver Function Tests: Recent Labs  Lab 11/17/22 1419  AST 17  ALT 14  ALKPHOS 95  BILITOT 0.8  PROT 6.8  ALBUMIN 3.9   No results for input(s): "LIPASE", "AMYLASE" in the last 168 hours. No results for input(s): "AMMONIA" in the last 168 hours. Coagulation Profile: No results for input(s): "INR", "PROTIME" in the last 168 hours. Cardiac Enzymes: No results for input(s): "CKTOTAL", "CKMB", "CKMBINDEX", "TROPONINI" in the last 168 hours. BNP (last 3 results) No results for input(s): "PROBNP" in the last 8760 hours. HbA1C: No results for input(s): "HGBA1C" in the last 72 hours. CBG: No results for input(s): "GLUCAP" in the last 168 hours. Lipid Profile: No results for input(s): "CHOL", "HDL", "LDLCALC", "TRIG", "CHOLHDL", "LDLDIRECT" in the last 72 hours. Thyroid Function Tests: No results for input(s): "TSH", "T4TOTAL", "FREET4", "T3FREE", "THYROIDAB" in the last 72 hours. Anemia Panel: No results for input(s): "VITAMINB12", "FOLATE", "FERRITIN", "TIBC", "IRON", "RETICCTPCT" in the last 72 hours. Sepsis Labs: Recent Labs  Lab 11/17/22 1419 11/17/22 1608  LATICACIDVEN 1.2 0.7    No results found for this or any previous visit (from the past 240 hour(s)).       Radiology Studies: No results found.      Scheduled Meds:  ARIPiprazole  5 mg Oral Daily   bictegravir-emtricitabine-tenofovir AF  1 tablet Oral Daily   clonazePAM  0.5 mg Oral BID   docusate sodium  100 mg Oral BID   DULoxetine  60 mg Oral Daily   heparin  5,000 Units Subcutaneous Q8H   losartan  12.5 mg Oral  Daily   pantoprazole  40 mg Oral QAC breakfast   senna  1 tablet Oral BID   Continuous Infusions:  sodium chloride 10 mL/hr at 11/18/22 0519   meropenem (MERREM) IV 1 g (11/18/22 0521)     LOS: 0 days    Time spent: 35 minutes    Richar Dunklee Darleen Crocker, DO [facility]  If 7PM-7AM, please contact night-coverage [email or web address] 11/18/2022, 10:02 AM

## 2022-11-18 NOTE — Progress Notes (Signed)
Patient admitted to 300 unit this shift, Medications given as ordered, no complaints of pain this shift. She had one bowel movement this shift. She slept the rest of the night.  Continued to monitor patient.

## 2022-11-18 NOTE — Care Management Obs Status (Signed)
[place] NOTIFICATION   Patient Details  Name: [name] MRN: [id number] Date of Birth: [birth year]   Medicare Observation Status Notification Given:  Yes    [name] 11/18/2022, 11:02 AM

## 2022-11-19 DIAGNOSIS — N3001 Acute cystitis with hematuria: Secondary | ICD-10-CM | POA: Diagnosis not present

## 2022-11-19 LAB — URINE CULTURE: Culture: >=100000 — AB

## 2022-11-19 NOTE — Progress Notes (Signed)
PROGRESS NOTE    [name]  OZH:[id number] DOB: [birth year] DOA: 11/17/2022 PCP: Caprice Renshaw, MD   Brief Narrative:  [name] is a 82 y.o. adult with medical history significant for COPD, CKD 3, dementia, HIV,HTN. Patient was sent to the ED from nursing home with reports of confusion.  She was also noted to have burning sensation with urination.  She was admitted with acute metabolic encephalopathy secondary to UTI and has known prior history of multidrug-resistant UTI and therefore patient started on Merrem empirically with urine cultures pending.  Assessment & Plan:   Principal Problem:   UTI (urinary tract infection) Active Problems:   HIV (human immunodeficiency virus infection) ([place])   Essential hypertension   CKD (chronic kidney disease) stage 3, GFR 30-59 ml/min (HCC)   Dementia (HCC)  Assessment and Plan:  UTI (urinary tract infection) Symptomatic with dysuria over the past week.  Afebrile without leukocytosis.  Lactic acid 1.2 > 0.7.  Rules out for sepsis.  History of multidrug-resistant UTI hence hospitalization requested.  Last urine cultures 01/07/2022 grew Proteus mirabilis resistant to multiple antibiotics, sensitive to imipenem, Zosyn and cefepime.  Treated with meropenem in the past - EDP consulted with pharmacy, started on meropenem -Follow-up urine cultures     HIV (human immunodeficiency virus infection) ([place]) Last viral load 04/2022- Undetectable, and CD4 count 571. -Resume Biktarvy   Essential hypertension Stable. -Resume losartan,   Dementia ([place]) On my evaluation she is awake alert oriented x 2.  Per nursing home, patient was not as talkative as she normally is this morning, otherwise mental status appears to be close to baseline. -Resume aripiprazole, Cymbalta,   CKD (chronic kidney disease) stage 3, GFR 30-59 ml/min (HCC) CKD stage IIIb.   Continue to monitor, stable creatinine level   DVT prophylaxis:Heparin Code Status: DNR Family  Communication: None at bedside Disposition Plan:  Status is: Inpatient Remains inpatient appropriate because: Need for IV antibiotics.    Consultants:  None  Procedures:  None  Antimicrobials:  Anti-infectives (From admission, onward)    Start     Dose/Rate Route Frequency Ordered Stop   11/18/22 1000  bictegravir-emtricitabine-tenofovir AF (BIKTARVY) 50-200-25 MG per tablet 1 tablet        1 tablet Oral Daily 11/17/22 2051     11/17/22 1730  meropenem (MERREM) 1 g in sodium chloride 0.9 % 100 mL IVPB        1 g 200 mL/hr over 30 Minutes Intravenous Every 12 hours 11/17/22 1716         Subjective: Patient seen and evaluated today with mild confusion still noted, she does not know the year.  No acute concerns or events noted overnight.  Objective: Vitals:   11/18/22 1309 11/18/22 2020 11/19/22 0458 11/19/22 0927  BP: 107/63 (!) 97/49 122/71 (!) 120/55  Pulse: 69 69 66 67  Resp:  '14 16 17  '$ Temp: 98.4 F (36.9 C) 98.7 F (37.1 C) 98.5 F (36.9 C) 97.7 F (36.5 C)  TempSrc: Oral Oral Oral Oral  SpO2: 100% 100% 98% 99%  Weight:      Height:        Intake/Output Summary (Last 24 hours) at 11/19/2022 0929 Last data filed at 11/18/2022 1700 Gross per 24 hour  Intake 388.45 ml  Output 250 ml  Net 138.45 ml   Filed Weights   11/17/22 1337 11/17/22 2033  Weight: 73.1 kg 66.4 kg    Examination:  General exam: Appears calm and comfortable, mildly confused Respiratory  system: Clear to auscultation. Respiratory effort normal. Cardiovascular system: S1 & S2 heard, RRR.  Gastrointestinal system: Abdomen is soft Central nervous system: Alert and awake Extremities: No edema Skin: No significant lesions noted Psychiatry: Flat affect.    Data Reviewed: I have personally reviewed following labs and imaging studies  CBC: Recent Labs  Lab 11/17/22 1419 11/18/22 0446  WBC 6.7 6.3  NEUTROABS 3.5  --   HGB 14.1 14.3  HCT 41.5 42.6  MCV 95.4 95.3  PLT 216 130    Basic Metabolic Panel: Recent Labs  Lab 11/17/22 1419 11/18/22 0446  NA 136 140  K 4.4 4.1  CL 109 111  CO2 20* 22  GLUCOSE 115* 93  BUN 22 21  CREATININE 1.34* 1.22*  CALCIUM 9.9 10.1   GFR: Estimated Creatinine Clearance (by C-G formula based on SCr of 1.22 mg/dL (H)) Female: 32.3 mL/min (A) Female: 39.8 mL/min (A) Liver Function Tests: Recent Labs  Lab 11/17/22 1419  AST 17  ALT 14  ALKPHOS 95  BILITOT 0.8  PROT 6.8  ALBUMIN 3.9   No results for input(s): "LIPASE", "AMYLASE" in the last 168 hours. No results for input(s): "AMMONIA" in the last 168 hours. Coagulation Profile: No results for input(s): "INR", "PROTIME" in the last 168 hours. Cardiac Enzymes: No results for input(s): "CKTOTAL", "CKMB", "CKMBINDEX", "TROPONINI" in the last 168 hours. BNP (last 3 results) No results for input(s): "PROBNP" in the last 8760 hours. HbA1C: No results for input(s): "HGBA1C" in the last 72 hours. CBG: No results for input(s): "GLUCAP" in the last 168 hours. Lipid Profile: No results for input(s): "CHOL", "HDL", "LDLCALC", "TRIG", "CHOLHDL", "LDLDIRECT" in the last 72 hours. Thyroid Function Tests: No results for input(s): "TSH", "T4TOTAL", "FREET4", "T3FREE", "THYROIDAB" in the last 72 hours. Anemia Panel: No results for input(s): "VITAMINB12", "FOLATE", "FERRITIN", "TIBC", "IRON", "RETICCTPCT" in the last 72 hours. Sepsis Labs: Recent Labs  Lab 11/17/22 1419 11/17/22 1608  LATICACIDVEN 1.2 0.7    Recent Results (from the past 240 hour(s))  Urine Culture     Status: Abnormal (Preliminary result)   Collection Time: 11/17/22  2:18 PM   Specimen: In/Out Cath Urine  Result Value Ref Range Status   Specimen Description   Final    IN/OUT CATH URINE Performed at [facility], [address] [place], [place] [address]    Special Requests   Final    NONE Performed at [facility], [address] [place], [place] [address]    Culture (A)  Final    >=100,000  COLONIES/mL GRAM NEGATIVE RODS IDENTIFICATION AND SUSCEPTIBILITIES TO FOLLOW Performed at [facility], [place] [address], [place], [place] [address]    Report Status PENDING  Incomplete         Radiology Studies: No results found.      Scheduled Meds:  ARIPiprazole  5 mg Oral Daily   bictegravir-emtricitabine-tenofovir AF  1 tablet Oral Daily   clonazePAM  0.5 mg Oral BID   docusate sodium  100 mg Oral BID   DULoxetine  60 mg Oral Daily   heparin  5,000 Units Subcutaneous Q8H   losartan  12.5 mg Oral Daily   pantoprazole  40 mg Oral QAC breakfast   senna  1 tablet Oral BID   Continuous Infusions:  sodium chloride 10 mL/hr at 11/18/22 0519   meropenem (MERREM) IV 1 g (11/19/22 0528)     LOS: 1 day    Time spent: 35 minutes    Elad Macphail D  Manuella Ghazi, DO [facility]  If 7PM-7AM, please contact night-coverage [email or web address] 11/19/2022, 9:29 AM

## 2022-11-20 DIAGNOSIS — N3001 Acute cystitis with hematuria: Secondary | ICD-10-CM | POA: Diagnosis not present

## 2022-11-20 LAB — HIV-1 RNA QUANT-NO REFLEX-BLD
HIV 1 RNA Quant: <20 copies/mL
LOG10 HIV-1 RNA: UNDETERMINED log10copy/mL

## 2022-11-20 LAB — BASIC METABOLIC PANEL
Anion gap: 7 (ref 5–15)
BUN: 18 mg/dL (ref 8–23)
CO2: 23 mmol/L (ref 22–32)
Calcium: 9.8 mg/dL (ref 8.9–10.3)
Chloride: 107 mmol/L (ref 98–111)
Creatinine, Ser: 1.32 mg/dL — ABNORMAL HIGH (ref 0.44–1.00)
GFR, Estimated: 41 mL/min — ABNORMAL LOW (ref >60)
Glucose, Bld: 99 mg/dL (ref 70–99)
Potassium: 3.8 mmol/L (ref 3.5–5.1)
Sodium: 137 mmol/L (ref 135–145)

## 2022-11-20 LAB — CBC
HCT: 37.6 % (ref 36.0–46.0)
Hemoglobin: 12.7 g/dL (ref 12.0–15.0)
MCH: 32.2 pg (ref 26.0–34.0)
MCHC: 33.8 g/dL (ref 30.0–36.0)
MCV: 95.2 fL (ref 80.0–100.0)
Platelets: 180 10*3/uL (ref 150–400)
RBC: 3.95 MIL/uL (ref 3.87–5.11)
RDW: 12.9 % (ref 11.5–15.5)
WBC: 3.6 10*3/uL — ABNORMAL LOW (ref 4.0–10.5)
nRBC: 0 % (ref 0.0–0.2)

## 2022-11-20 LAB — MAGNESIUM: Magnesium: 1.9 mg/dL (ref 1.7–2.4)

## 2022-11-20 MED ORDER — FOSFOMYCIN TROMETHAMINE 3 G PO PACK
3.0000 g | PACK | Freq: Once | ORAL | Status: AC
  Administered 2022-11-20: 3 g via ORAL
  Filled 2022-11-20: qty 3

## 2022-11-20 MED ORDER — CLONAZEPAM 0.5 MG PO TABS: 0.5000 mg | ORAL_TABLET | Freq: Two times a day (BID) | ORAL | 0 refills | Status: DC

## 2022-11-20 NOTE — Plan of Care (Signed)
  Problem: Education: Goal: Knowledge of General Education information will improve Description: Including pain rating scale, medication(s)/side effects and non-pharmacologic comfort measures Outcome: Progressing   Problem: Health Behavior/Discharge Planning: Goal: Ability to manage health-related needs will improve Outcome: Progressing

## 2022-11-20 NOTE — TOC Transition Note (Signed)
Transition of Care [facility]) - CM/SW Discharge Note   Patient Details  Name: [name] MRN: [id number] Date of Birth: 1941-04-30  Transition of [facility]) CM/SW Contact:  Shade Flood, LCSW Phone Number: 11/20/2022, 11:56 AM   Clinical Narrative:     Pt medically stable for dc today per MD. Updated Jackelyn Poling at [facility].   Updated POA, Rachael, who is aware that [facility] did not offer a bed and that pt will be returning to [facility].  DC clinical sent electronically. RN to call report. [facility] transport arranged.  No other TOC needs for dc.  Final next level of care: Long Term Nursing Home Barriers to Discharge: Barriers Resolved   Patient Goals and CMS Choice   Choice offered to / list presented to : Patient  Discharge Placement                         Discharge Plan and Services Additional resources added to the After Visit Summary for   In-house Referral: Clinical Social Work   Post Acute Care Choice: Resumption of Svcs/PTA Provider                               Social Determinants of Health (SDOH) Interventions SDOH Screenings   Depression (PHQ2-9): Low Risk  (01/05/2022)  Tobacco Use: Low Risk  (05/25/2022)     Readmission Risk Interventions    06/23/2021   11:18 AM 05/11/2021    3:21 PM  Readmission Risk Prevention Plan  Transportation Screening Complete Complete  PCP or Specialist Appt within 3-5 Days  Complete  HRI or [place] Complete Complete  Social Work Consult for [place] Planning/Counseling Complete Complete  Palliative Care Screening Not Applicable Not Applicable  Medication Review Press photographer) Complete Complete

## 2022-11-20 NOTE — Progress Notes (Signed)
Removed IV-CDI. Called report to [place] at [facility]. [facility] came to transport.

## 2022-11-20 NOTE — Plan of Care (Signed)
  Problem: Education: Goal: Knowledge of General Education information will improve Description: Including pain rating scale, medication(s)/side effects and non-pharmacologic comfort measures 11/20/2022 1105 by Santa Lighter, RN Outcome: Adequate for Discharge 11/20/2022 1052 by Santa Lighter, RN Outcome: Progressing   Problem: Health Behavior/Discharge Planning: Goal: Ability to manage health-related needs will improve 11/20/2022 1105 by Santa Lighter, RN Outcome: Adequate for Discharge 11/20/2022 1052 by Santa Lighter, RN Outcome: Progressing   Problem: Clinical Measurements: Goal: Ability to maintain clinical measurements within normal limits will improve Outcome: Adequate for Discharge Goal: Will remain free from infection Outcome: Adequate for Discharge Goal: Diagnostic test results will improve Outcome: Adequate for Discharge Goal: Respiratory complications will improve Outcome: Adequate for Discharge Goal: Cardiovascular complication will be avoided Outcome: Adequate for Discharge   Problem: Activity: Goal: Risk for activity intolerance will decrease Outcome: Adequate for Discharge   Problem: Nutrition: Goal: Adequate nutrition will be maintained Outcome: Adequate for Discharge   Problem: Coping: Goal: Level of anxiety will decrease Outcome: Adequate for Discharge   Problem: Elimination: Goal: Will not experience complications related to bowel motility Outcome: Adequate for Discharge Goal: Will not experience complications related to urinary retention Outcome: Adequate for Discharge   Problem: Pain Managment: Goal: General experience of comfort will improve Outcome: Adequate for Discharge   Problem: Safety: Goal: Ability to remain free from injury will improve Outcome: Adequate for Discharge   Problem: Skin Integrity: Goal: Risk for impaired skin integrity will decrease Outcome: Adequate for Discharge

## 2022-11-20 NOTE — Care Management Important Message (Signed)
Important Message  Patient Details  Name: [name] MRN: [id number] Date of Birth: [birth year]   Medicare Important Message Given:  Yes (spoke with daughter in law [name] at [phone] to reveiw letter, copy mailed to address on file due to isolation precautions)     [name] 11/20/2022, 10:50 AM

## 2022-11-20 NOTE — Discharge Summary (Signed)
Physician Discharge Summary  [name] ERD:[id number] DOB: 08/11/1941 DOA: 11/17/2022  PCP: Caprice Renshaw, MD  Admit date: 11/17/2022  Discharge date: 11/20/2022  Admitted From:SNF  Disposition:  SNF  Recommendations for Outpatient Follow-up:  Follow up with PCP in 1-2 weeks Dose of fosfomycin given inpatient to cover for multidrug-resistant Proteus infection as per ID recommendations Continue other home medications as prior  Home Health: None  Equipment/Devices: None  Discharge Condition:Stable  CODE STATUS: Full  Diet recommendation: Heart Healthy  Brief/Interim Summary: [name] is a 82 y.o. adult with medical history significant for COPD, CKD 3, dementia, HIV,HTN. Patient was sent to the ED from nursing home with reports of confusion.  She was also noted to have burning sensation with urination.  She was admitted with acute metabolic encephalopathy secondary to UTI and has known prior history of multidrug-resistant UTI and therefore patient started on Merrem empirically.  She had done quite well with improvement in her mentation and urine cultures eventually resulted with multidrug-resistant Proteus infection.  Case reviewed by ID Dr. Candiss Norse who recommended 1 dose of oral fosfomycin and subsequent discharge back to SNF.  She is in stable condition for discharge today with no other acute events noted.  Discharge Diagnoses:  Principal Problem:   UTI (urinary tract infection) Active Problems:   HIV (human immunodeficiency virus infection) ([place])   Essential hypertension   CKD (chronic kidney disease) stage 3, GFR 30-59 ml/min (HCC)   Dementia (HCC)  Principal discharge diagnosis: Acute metabolic encephalopathy secondary to multidrug-resistant Proteus UTI.  Discharge Instructions  Discharge Instructions     Diet - low sodium heart healthy   Complete by: As directed    Increase activity slowly   Complete by: As directed       Allergies as of 11/20/2022        Reactions   Bee Venom Anaphylaxis   Promethazine Swelling   Tenofovir Disoproxil Fumarate [tenofovir Disoproxil Fumarate] Other (See Comments)   Renal failure, (08/27/2006)   Compazine  [prochlorperazine Edisylate] Nausea And Vomiting   Haloperidol Lactate Other (See Comments)   Reaction is muscle tension, causes severe spasms in face and neck. Forces eyes to roll in the back of the head   Sulfa Antibiotics Diarrhea   Brompheniramine-acetaminophen Other (See Comments)   unknown   Chlorcyclizine Other (See Comments)   Unknown   Chlorpromazine Other (See Comments)   Unknown   Codeine Itching, Nausea And Vomiting   Navane [thiothixene (tiotixene)] Other (See Comments), Nausea And Vomiting   Same muscle spasm reaction as haldol   Prednisone Other (See Comments)   Brief mild psychosis and agitation   Promethazine Hcl Other (See Comments)   Propoxyphene N-acetaminophen Itching, Nausea And Vomiting   Morphine Itching, Swelling        Medication List     STOP taking these medications    fluconazole 150 MG tablet Commonly known as: DIFLUCAN       TAKE these medications    Allergy Relief 10 MG tablet Generic drug: loratadine Take 10 mg by mouth daily.   ARIPiprazole 5 MG tablet Commonly known as: ABILIFY Take 5 mg by mouth daily.   atorvastatin 20 MG tablet Commonly known as: LIPITOR Take 20 mg by mouth daily.   Biktarvy 50-200-25 MG Tabs tablet Generic drug: bictegravir-emtricitabine-tenofovir AF Take 1 tablet by mouth daily. Please discontinue TRIUMEQ and TIVICAY   cephALEXin 250 MG capsule Commonly known as: KEFLEX Take 250 mg by mouth at bedtime.   clonazePAM  0.5 MG tablet Commonly known as: KLONOPIN Take 1 tablet (0.5 mg total) by mouth 2 (two) times daily. What changed: Another medication with the same name was removed. Continue taking this medication, and follow the directions you see here.   Cranberry 250 MG Tabs Take 450 mg by mouth every morning.    docusate sodium 100 MG capsule Commonly known as: COLACE Take 1 capsule (100 mg total) by mouth 2 (two) times daily.   DULoxetine 60 MG capsule Commonly known as: CYMBALTA Take 60 mg by mouth daily.   feeding supplement Liqd Take 237 mLs by mouth 2 (two) times daily between meals.   fluticasone 50 MCG/ACT nasal spray Commonly known as: FLONASE 2 SPRAYS INTO BOTH NOSTRILS AT BEDTIME. What changed: See the new instructions.   furosemide 40 MG tablet Commonly known as: LASIX Take 1 tablet (40 mg total) by mouth daily as needed for fluid. Hold until follow up with PCP What changed: additional instructions   losartan 25 MG tablet Commonly known as: COZAAR Take 12.5 mg by mouth daily.   Magnesium 100 MG Tabs Take 400 mg by mouth daily.   Menthol-Zinc Oxide 0.44-20.6 % Oint Apply 1 application. topically every 8 (eight) hours as needed (protection). Apply to buttocks   Myrbetriq 25 MG Tb24 tablet Generic drug: mirabegron ER Take 25 mg by mouth daily.   ONE-A-DAY 55 PLUS PO Take 1 tablet by mouth daily.   oxymetazoline 0.05 % nasal spray Commonly known as: AFRIN Place 1 spray into both nostrils every 12 (twelve) hours as needed for congestion (Nose Bleeds).   pantoprazole 40 MG tablet Commonly known as: Protonix Take 1 tablet (40 mg total) by mouth daily before breakfast.   polyethylene glycol 17 g packet Commonly known as: MiraLax Take 17 g by mouth daily. Hold for diarrhea   polyvinyl alcohol 1.4 % ophthalmic solution Commonly known as: LIQUIFILM TEARS Place 1 drop into both eyes daily.   senna 8.6 MG tablet Commonly known as: SENOKOT Take 1 tablet by mouth 2 (two) times daily.   thiamine 100 MG tablet Commonly known as: Vitamin B-1 Take 100 mg by mouth daily.        Follow-up Information     Caprice Renshaw, MD. Schedule an appointment as soon as possible for a visit in 1 week(s).   Specialty: Internal Medicine Contact information: [place] [address] [phone]                Allergies  Allergen Reactions   Bee Venom Anaphylaxis   Promethazine Swelling   Tenofovir Disoproxil Fumarate [Tenofovir Disoproxil Fumarate] Other (See Comments)    Renal failure, (08/27/2006)   Compazine  [Prochlorperazine Edisylate] Nausea And Vomiting   Haloperidol Lactate Other (See Comments)    Reaction is muscle tension, causes severe spasms in face and neck. Forces eyes to roll in the back of the head   Sulfa Antibiotics Diarrhea   Brompheniramine-Acetaminophen Other (See Comments)    unknown   Chlorcyclizine Other (See Comments)    Unknown   Chlorpromazine Other (See Comments)    Unknown   Codeine Itching and Nausea And Vomiting   Navane [Thiothixene (Tiotixene)] Other (See Comments) and Nausea And Vomiting    Same muscle spasm reaction as haldol   Prednisone Other (See Comments)    Brief mild psychosis and agitation   Promethazine Hcl Other (See Comments)   Propoxyphene N-Acetaminophen Itching and Nausea And Vomiting   Morphine Itching and Swelling  Consultations: Discussed case with ID Dr. Candiss Norse   Procedures/Studies: No results found.   Discharge Exam: Vitals:   11/19/22 1934 11/20/22 0312  BP: (!) 120/59 135/68  Pulse: 73 69  Resp: 12 (!) 22  Temp: 98.9 F (37.2 C) 98.8 F (37.1 C)  SpO2: 96% 96%   Vitals:   11/19/22 0927 11/19/22 1331 11/19/22 1934 11/20/22 0312  BP: (!) 120/55 90/77 (!) 120/59 135/68  Pulse: 67 67 73 69  Resp: 17  12 (!) 22  Temp: 97.7 F (36.5 C) 97.6 F (36.4 C) 98.9 F (37.2 C) 98.8 F (37.1 C)  TempSrc: Oral Oral Oral Oral  SpO2: 99% 96% 96% 96%  Weight:      Height:        General: Pt is alert, awake, not in acute distress Cardiovascular: RRR, S1/S2 +, no rubs, no gallops Respiratory: CTA bilaterally, no wheezing, no rhonchi Abdominal: Soft, NT, ND, bowel sounds + Extremities: no edema, no cyanosis    The results of significant diagnostics from  this hospitalization (including imaging, microbiology, ancillary and laboratory) are listed below for reference.     Microbiology: Recent Results (from the past 240 hour(s))  Urine Culture     Status: Abnormal   Collection Time: 11/17/22  2:18 PM   Specimen: In/Out Cath Urine  Result Value Ref Range Status   Specimen Description   Final    IN/OUT CATH URINE Performed at [facility], [address], [place], [place] [address]    Special Requests   Final    NONE Performed at [facility], [address], [place], [place] [address]    Culture >=100,000 COLONIES/mL PROTEUS MIRABILIS (A)  Final   Report Status 11/19/2022 FINAL  Final   Organism ID, Bacteria PROTEUS MIRABILIS (A)  Final      Susceptibility   Proteus mirabilis - MIC*    AMPICILLIN >=32 RESISTANT Resistant     CEFAZOLIN >=64 RESISTANT Resistant     CEFEPIME 16 RESISTANT Resistant     CEFTRIAXONE 8 RESISTANT Resistant     CIPROFLOXACIN >=4 RESISTANT Resistant     GENTAMICIN <=1 SENSITIVE Sensitive     IMIPENEM 4 SENSITIVE Sensitive     NITROFURANTOIN 128 RESISTANT Resistant     TRIMETH/SULFA >=320 RESISTANT Resistant     AMPICILLIN/SULBACTAM >=32 RESISTANT Resistant     PIP/TAZO <=4 SENSITIVE Sensitive     * >=100,000 COLONIES/mL PROTEUS MIRABILIS     Labs: BNP (last 3 results) No results for input(s): "BNP" in the last 8760 hours. Basic Metabolic Panel: Recent Labs  Lab 11/17/22 1419 11/18/22 0446 11/20/22 0436  NA 136 140 137  K 4.4 4.1 3.8  CL 109 111 107  CO2 20* 22 23  GLUCOSE 115* 93 99  BUN '22 21 18  '$ CREATININE 1.34* 1.22* 1.32*  CALCIUM 9.9 10.1 9.8  MG  --   --  1.9   Liver Function Tests: Recent Labs  Lab 11/17/22 1419  AST 17  ALT 14  ALKPHOS 95  BILITOT 0.8  PROT 6.8  ALBUMIN 3.9   No results for input(s): "LIPASE", "AMYLASE" in the last 168 hours. No results for input(s): "AMMONIA" in the last 168 hours. CBC: Recent Labs  Lab 11/17/22 1419 11/18/22 0446 11/20/22 0436  WBC  6.7 6.3 3.6*  NEUTROABS 3.5  --   --   HGB 14.1 14.3 12.7  HCT 41.5 42.6 37.6  MCV 95.4 95.3 95.2  PLT 216 219 180   Cardiac Enzymes: No results  for input(s): "CKTOTAL", "CKMB", "CKMBINDEX", "TROPONINI" in the last 168 hours. BNP: Invalid input(s): "POCBNP" CBG: No results for input(s): "GLUCAP" in the last 168 hours. D-Dimer No results for input(s): "DDIMER" in the last 72 hours. Hgb A1c No results for input(s): "HGBA1C" in the last 72 hours. Lipid Profile No results for input(s): "CHOL", "HDL", "LDLCALC", "TRIG", "CHOLHDL", "LDLDIRECT" in the last 72 hours. Thyroid function studies No results for input(s): "TSH", "T4TOTAL", "T3FREE", "THYROIDAB" in the last 72 hours.  Invalid input(s): "FREET3" Anemia work up No results for input(s): "VITAMINB12", "FOLATE", "FERRITIN", "TIBC", "IRON", "RETICCTPCT" in the last 72 hours. Urinalysis    Component Value Date/Time   COLORURINE AMBER (A) 11/17/2022 1418   APPEARANCEUR CLOUDY (A) 11/17/2022 1418   APPEARANCEUR Turbid (A) 01/20/2022 1450   LABSPEC 1.019 11/17/2022 1418   PHURINE 7.0 11/17/2022 1418   GLUCOSEU NEGATIVE 11/17/2022 1418   GLUCOSEU NEG mg/dL 09/20/2007 1114   HGBUR MODERATE (A) 11/17/2022 1418   HGBUR moderate 06/04/2010 1425   BILIRUBINUR NEGATIVE 11/17/2022 1418   BILIRUBINUR Negative 01/20/2022 1450   KETONESUR 5 (A) 11/17/2022 1418   PROTEINUR >=300 (A) 11/17/2022 1418   UROBILINOGEN 1.0 05/31/2014 0933   NITRITE POSITIVE (A) 11/17/2022 1418   LEUKOCYTESUR MODERATE (A) 11/17/2022 1418   Sepsis Labs Recent Labs  Lab 11/17/22 1419 11/18/22 0446 11/20/22 0436  WBC 6.7 6.3 3.6*   Microbiology Recent Results (from the past 240 hour(s))  Urine Culture     Status: Abnormal   Collection Time: 11/17/22  2:18 PM   Specimen: In/Out Cath Urine  Result Value Ref Range Status   Specimen Description   Final    IN/OUT CATH URINE Performed at [facility], [address] [place], [place] [address]    Special  Requests   Final    NONE Performed at [facility], [address] [place], [place] [address]    Culture >=100,000 COLONIES/mL PROTEUS MIRABILIS (A)  Final   Report Status 11/19/2022 FINAL  Final   Organism ID, Bacteria PROTEUS MIRABILIS (A)  Final      Susceptibility   Proteus mirabilis - MIC*    AMPICILLIN >=32 RESISTANT Resistant     CEFAZOLIN >=64 RESISTANT Resistant     CEFEPIME 16 RESISTANT Resistant     CEFTRIAXONE 8 RESISTANT Resistant     CIPROFLOXACIN >=4 RESISTANT Resistant     GENTAMICIN <=1 SENSITIVE Sensitive     IMIPENEM 4 SENSITIVE Sensitive     NITROFURANTOIN 128 RESISTANT Resistant     TRIMETH/SULFA >=320 RESISTANT Resistant     AMPICILLIN/SULBACTAM >=32 RESISTANT Resistant     PIP/TAZO <=4 SENSITIVE Sensitive     * >=100,000 COLONIES/mL PROTEUS MIRABILIS     Time coordinating discharge: 35 minutes  SIGNED:   Rodena Goldmann, DO [facility] 11/20/2022, 11:11 AM  If 7PM-7AM, please contact night-coverage [email or web address]

## 2022-11-30 ENCOUNTER — Other Ambulatory Visit: Payer: Self-pay

## 2022-11-30 ENCOUNTER — Ambulatory Visit (INDEPENDENT_AMBULATORY_CARE_PROVIDER_SITE_OTHER): Payer: Medicare Other | Admitting: Internal Medicine

## 2022-11-30 ENCOUNTER — Encounter: Payer: Self-pay | Admitting: Internal Medicine

## 2022-11-30 VITALS — BP 115/70 | HR 75 | Temp 97.9°F | Wt 144.0 lb

## 2022-11-30 DIAGNOSIS — B20 Human immunodeficiency virus [HIV] disease: Secondary | ICD-10-CM | POA: Diagnosis not present

## 2022-11-30 NOTE — Progress Notes (Signed)
[facility]     HPI: [name] is a 82 y.o. adult  with HIV on biktarvy. Last visit Biktarvy switched from triumeq and tivicay. Presents from SNF. Recent hospitalization for metabolic encephalopathy suspected 2/2 UTI. Blood Cx with NG. Pt initially on merrem, Urine Cx_ Proteus discharged on fosfomycin x 1 dose Date of diagnosis late 1990s(per pt) via blood transfusion ART exposure Past Ois Biktarrvy 7/24 Tivicay triumeq switch ed due to ease of dosing Norvir, Reyataz, emtriva, Ziagen PmHX: anxiety, depression, COPD, Depression CKD III, recurrent UTI  Past Medical History:  Diagnosis Date   Anxiety    Arthritis    Bronchitis    Bronchitis    CKD (chronic kidney disease) stage 3, GFR 30-59 ml/min ([place]) 0/12/4915   Complication of anesthesia    pt states she had a cardiac arrest during hysterectomy 1985.  Pt had several surgies since then that were uneventful   Confusion 01/05/2022   COPD (chronic obstructive pulmonary disease) ([place])    Dementia ([place]) 01/05/2022   Depression    Dizziness 01/05/2022   Dysuria 03/27/2019   Elevated LFTs 05/04/2016   History of TIAs 1981   left side weakness   HIV (human immunodeficiency virus infection) ([place])    HIV infection with neurological disease ([place]) 05/25/2022   Hypertension    Kidney disease related to HIV infection ([place])    pt states she has to have her creatinine level checked often    Low blood pressure 01/05/2022   Moderate single current episode of major depressive disorder ([place]) 03/12/2021   Nausea 03/03/2017   Pelvic pain 05/25/2022   Peripheral neuropathy    bilateral feet   Seasonal allergies 03/27/2019   Sinusitis 05/06/2018   UTI (urinary tract infection) 03/03/2017   Weight loss 03/03/2017    Past Surgical History:  Procedure Laterality Date   ABDOMINAL HYSTERECTOMY  1985   BACK SURGERY     BIOPSY  07/15/2020   Procedure: BIOPSY;  Surgeon: Daneil Dolin, MD;  [facility]: [facility];   Service: Endoscopy;;   CATARACT EXTRACTION, BILATERAL     CHOLECYSTECTOMY     COLONOSCOPY  08/10/2002   NUR: Normal colonoscopy except for some hemorrhoids and some erythema at the dentate line   COLONOSCOPY  12/10/2006   Edwards:Diffuse colitis in the transverse and descending colon/This is not entirely typical of ischemic colitis or her HIV positive/ disease.  We need to be concerned about other causes   COLONOSCOPY  01/19/2011   RMR: Internal and external hemorrhoids, likely source of hematochezia anal papilla, otherwise normal rectal mucosa/ Left-sided diverticula.  Cecal polyp with status post cold snare polypectomy.  Remainder of colonic mucosa appeared normal. Pathology with tubular adenoma. Repeat in 2017   COLONOSCOPY WITH PROPOFOL N/A 07/15/2020   Procedure: COLONOSCOPY WITH PROPOFOL;  Surgeon: Daneil Dolin, MD;  [facility];  Service: Endoscopy;  Laterality: N/A;  8:15AM TCS via Ostomy   COLOSTOMY N/A 11/12/2018   Procedure: COLOSTOMY;  Surgeon: Virl Cagey, MD;  Location: [facility];  Service: General;  Laterality: N/A;   COLOSTOMY REVERSAL N/A 10/02/2020   Procedure: COLOSTOMY REVERSAL;  Surgeon: Virl Cagey, MD;  Location: [facility];  Service: General;  Laterality: N/A;   ESOPHAGOGASTRODUODENOSCOPY  08/10/2002     NUR: Mild changes of reflux esophagitis limited to gastroesophageal junction  No evidence of ring, stricture, or esophageal candidiasis dysphagia/ Gastritis, possibly H. pylori induced  ESOPHAGOGASTRODUODENOSCOPY  12/25/2002   NUR: Erosive antral gastritis.  The degree of gastritis is more significant than her last exam/ Normal examination of the esophagus/ Esophageal dilatation performed by passing 94 and 10 [place]   ESOPHAGOGASTRODUODENOSCOPY  11/13/2010   RMR: Circumferential distal esophageal erosions with soft peptic stricture, consistent with erosive reflux esophagitis or stricture  formation, status post dilation as described above/   Small hiatal hernia/ Tiny antral erosions, otherwise normal stomach D1 and D2   ESOPHAGOGASTRODUODENOSCOPY (EGD) WITH PROPOFOL N/A 07/15/2020   Procedure: ESOPHAGOGASTRODUODENOSCOPY (EGD) WITH PROPOFOL;  Surgeon: Daneil Dolin, MD;  [facility];  Service: Endoscopy;  Laterality: N/A;   FLEXIBLE SIGMOIDOSCOPY N/A 07/15/2020   Procedure: FLEXIBLE SIGMOIDOSCOPY;  Surgeon: Daneil Dolin, MD;  [facility];  Service: Endoscopy;  Laterality: N/A;   HEMORRHOID SURGERY  09/02/2012   Procedure: HEMORRHOIDECTOMY;  Surgeon: Jamesetta So, MD;  Location: [facility];  Service: General;  Laterality: N/A;   LAPAROTOMY N/A 11/12/2018   Procedure: EXPLORATORY LAPAROTOMY;  Surgeon: Virl Cagey, MD;  Location: [facility];  Service: General;  Laterality: N/A;   MALONEY DILATION N/A 07/15/2020   Procedure: Keturah Shavers;  Surgeon: Daneil Dolin, MD;  [facility];  Service: Endoscopy;  Laterality: N/A;   PARTIAL COLECTOMY N/A 11/12/2018   Procedure: PARTIAL COLECTOMY;  Surgeon: Virl Cagey, MD;  Location: [facility];  Service: General;  Laterality: N/A;   POLYPECTOMY  07/15/2020   Procedure: POLYPECTOMY;  Surgeon: Daneil Dolin, MD;  [facility];  Service: Endoscopy;;   TUBAL LIGATION      Family History  Problem Relation Age of Onset   Diabetes Mother    Diabetes Brother    Colon cancer Brother        Diagnosed > age 53   Current Outpatient Medications on File Prior to Visit  Medication Sig Dispense Refill   ALLERGY RELIEF 10 MG tablet Take 10 mg by mouth daily.     ARIPiprazole (ABILIFY) 5 MG tablet Take 5 mg by mouth daily.     atorvastatin (LIPITOR) 20 MG tablet Take 20 mg by mouth daily.     bictegravir-emtricitabine-tenofovir AF (BIKTARVY) 50-200-25 MG TABS tablet Take 1 tablet by mouth daily. Please discontinue TRIUMEQ and TIVICAY 30 tablet 11   cephALEXin (KEFLEX) 250 MG capsule Take 250 mg by mouth at bedtime.     clonazePAM (KLONOPIN) 0.5 MG  tablet Take 1 tablet (0.5 mg total) by mouth 2 (two) times daily. 10 tablet 0   Cranberry 250 MG TABS Take 450 mg by mouth every morning.     docusate sodium (COLACE) 100 MG capsule Take 1 capsule (100 mg total) by mouth 2 (two) times daily. 10 capsule 0   DULoxetine (CYMBALTA) 60 MG capsule Take 60 mg by mouth daily.     feeding supplement (ENSURE ENLIVE / ENSURE PLUS) LIQD Take 237 mLs by mouth 2 (two) times daily between meals. 237 mL 12   fluticasone (FLONASE) 50 MCG/ACT nasal spray 2 SPRAYS INTO BOTH NOSTRILS AT BEDTIME. (Patient taking differently: Place 2 sprays into both nostrils at bedtime.) 16 g 5   furosemide (LASIX) 40 MG tablet Take 1 tablet (40 mg total) by mouth daily as needed for fluid. Hold until follow up with PCP (Patient taking differently: Take 40 mg by mouth daily as needed for fluid.)     losartan (COZAAR) 25 MG tablet Take 12.5 mg by mouth daily.  Magnesium 100 MG TABS Take 400 mg by mouth daily.     Menthol-Zinc Oxide 0.44-20.6 % OINT Apply 1 application. topically every 8 (eight) hours as needed (protection). Apply to buttocks     Multiple Vitamin (ONE-A-DAY 55 PLUS PO) Take 1 tablet by mouth daily.     MYRBETRIQ 25 MG TB24 tablet Take 25 mg by mouth daily.     oxymetazoline (AFRIN) 0.05 % nasal spray Place 1 spray into both nostrils every 12 (twelve) hours as needed for congestion (Nose Bleeds).     pantoprazole (PROTONIX) 40 MG tablet Take 1 tablet (40 mg total) by mouth daily before breakfast. 30 tablet 5   polyethylene glycol (MIRALAX) 17 g packet Take 17 g by mouth daily. Hold for diarrhea     polyvinyl alcohol (LIQUIFILM TEARS) 1.4 % ophthalmic solution Place 1 drop into both eyes daily.     senna (SENOKOT) 8.6 MG tablet Take 1 tablet by mouth 2 (two) times daily.     thiamine (VITAMIN B-1) 100 MG tablet Take 100 mg by mouth daily.     No current facility-administered medications on file prior to visit.    Allergies  Allergen Reactions   Bee Venom  Anaphylaxis   Promethazine Swelling   Tenofovir Disoproxil Fumarate [Tenofovir Disoproxil Fumarate] Other (See Comments)    Renal failure, (08/27/2006)   Compazine  [Prochlorperazine Edisylate] Nausea And Vomiting   Haloperidol Lactate Other (See Comments)    Reaction is muscle tension, causes severe spasms in face and neck. Forces eyes to roll in the back of the head   Sulfa Antibiotics Diarrhea   Brompheniramine-Acetaminophen Other (See Comments)    unknown   Chlorcyclizine Other (See Comments)    Unknown   Chlorpromazine Other (See Comments)    Unknown   Codeine Itching and Nausea And Vomiting   Navane [Thiothixene (Tiotixene)] Other (See Comments) and Nausea And Vomiting    Same muscle spasm reaction as haldol   Prednisone Other (See Comments)    Brief mild psychosis and agitation   Promethazine Hcl Other (See Comments)   Propoxyphene N-Acetaminophen Itching and Nausea And Vomiting   Morphine Itching and Swelling     Lab Results HIV 1 RNA Quant  Date Value  11/19/2022 <20 copies/mL  05/25/2022 Not Detected Copies/mL  01/06/2022 <20 copies/mL   CD4 T Cell Abs (/uL)  Date Value  05/25/2022 571  01/05/2022 633  03/12/2021 637   No results found for: "HIV1GENOSEQ" Lab Results  Component Value Date   WBC 3.6 (L) 11/20/2022   HGB 12.7 11/20/2022   HCT 37.6 11/20/2022   MCV 95.2 11/20/2022   PLT 180 11/20/2022    Lab Results  Component Value Date   CREATININE 1.32 (H) 11/20/2022   BUN 18 11/20/2022   NA 137 11/20/2022   K 3.8 11/20/2022   CL 107 11/20/2022   CO2 23 11/20/2022   Lab Results  Component Value Date   ALT 14 11/17/2022   AST 17 11/17/2022   ALKPHOS 95 11/17/2022   BILITOT 0.8 11/17/2022    Lab Results  Component Value Date   CHOL 175 05/25/2022   TRIG 209 (H) 05/25/2022   HDL 37 (L) 05/25/2022   LDLCALC 105 (H) 05/25/2022   No results found for: "HAV" Lab Results  Component Value Date   HEPBSAG NEGATIVE 05/04/2016   HEPBSAB NEG  05/04/2016   Lab Results  Component Value Date   HCVAB NEGATIVE 05/04/2016   Lab Results  Component Value Date  CHLAMYDIAWP Negative 03/27/2019   N Negative 03/27/2019   No results found for: "GCPROBEAPT" No results found for: "QUANTGOLD"  Plan #HIV -Continue Biktarvy -CD4 today, VL ND on 11/19/22 -Rest of labs as below -F/U with Dr. [name] in 6 month for HIV care  #CKD #Hx of recurrent UTI with MDR proteus mirabilis -Recent hospitalization with c/f UTI and HX of urine retention. She had presented with abdominal pain, No fevers /leukocytosis. Received about 3 days of merrem then discharged on fosfomycin x1 for MDR proteus mirabilis. We attempted oto get GC urine, but unable as as pt needs to cath'd for urine. I think she needs urology follow-up given persistent cath needs. No dysuria today, she states she had abdominal pain on and off.  -Seen by urology on 07/20/22, F/U if needed Plan: -F/U with Urology  #Vaccination COVID-needs PCV 13 on 05/06/2018 23 06/02/2014 Meningitis-needs Hep A today 11/30/22(2017 NR) x 1 dose in 2019 HEpB s Btoday 11/30/22(2017 SAB NR), 3 sereis imm  in 2003 Tdap 01/16/2017 Shingles-needs  #Health maintenance -Quantiferon today 11/30/22 -RPR NR 05/25/22 -GC today 11/30/22(can get done at facility as pt needs to be cath'd) -HCV today 11/30/22 -Lipid panel ldl [address] on [place], [facility] [place]   I have personally spent 41 minutes involved in face-to-face and non-face-to-face activities for this patient on the day of the visit. Professional time spent includes the following activities: Preparing to see the patient (review of tests), Obtaining and/or reviewing separately obtained history (admission/discharge record), Performing a medically appropriate examination and/or evaluation , Ordering medications/tests/procedures, referring and communicating with other health care professionals,  Documenting clinical information in the EMR, Independently interpreting results (not separately reported), Communicating results to the patient/family/caregiver, Counseling and educating the patient/family/caregiver and Care coordination (not separately reported).

## 2022-12-01 LAB — T-HELPER CELLS (CD4) COUNT (NOT AT ARMC)
CD4 % Helper T Cell: 22 % — ABNORMAL LOW (ref 33–65)
CD4 T Cell Abs: 616 /uL (ref 400–1790)

## 2022-12-02 LAB — QUANTIFERON-TB GOLD PLUS
Mitogen-NIL: 9.8 IU/mL
NIL: 0.08 IU/mL
QuantiFERON-TB Gold Plus: NEGATIVE
TB1-NIL: 0.02 IU/mL
TB2-NIL: 0.02 IU/mL

## 2022-12-02 LAB — HEPATITIS A ANTIBODY, TOTAL: Hepatitis A AB,Total: REACTIVE — AB

## 2022-12-02 LAB — HEPATITIS B SURFACE ANTIGEN: Hepatitis B Surface Ag: NONREACTIVE

## 2022-12-02 LAB — HEPATITIS B SURFACE ANTIBODY,QUALITATIVE: Hep B S Ab: NONREACTIVE

## 2022-12-02 LAB — HEPATITIS B CORE ANTIBODY, TOTAL: Hep B Core Total Ab: NONREACTIVE

## 2022-12-10 ENCOUNTER — Telehealth: Payer: Self-pay

## 2022-12-10 NOTE — Telephone Encounter (Signed)
[place], RN called office to inform provider that they have attempted twice to do Urine lab for Gonorrhea/ Chlamydia. Per RN the lab they ordered does not test for this. They do lab a different lab that can do STD testing, but urine would not be good for lab by the time it reached the lab site.  Leatrice Jewels, RMA

## 2022-12-14 NOTE — Telephone Encounter (Signed)
Patient was given 3 day supply of Pyridium and completed it per Nestor Lewandowsky, RN. RN will make note in her chart for patient to follow up with Urology.    [place], CMA

## 2022-12-14 NOTE — Telephone Encounter (Signed)
RN called requesting fax number to fax over patient labs. Also patient is having dysuria. SNF provider prescribed pyridium and wanted to inform Dr.Singh.    [place], CMA

## 2022-12-14 NOTE — Telephone Encounter (Signed)
Left message with RN at [facility] requesting call back to discuss medication.    [place], CMA

## 2022-12-22 ENCOUNTER — Ambulatory Visit: Payer: Medicaid Other | Admitting: Urology

## 2023-02-10 ENCOUNTER — Ambulatory Visit (INDEPENDENT_AMBULATORY_CARE_PROVIDER_SITE_OTHER): Payer: Medicare Other | Admitting: Urology

## 2023-02-10 VITALS — BP 115/73 | HR 75

## 2023-02-10 DIAGNOSIS — N3281 Overactive bladder: Secondary | ICD-10-CM | POA: Diagnosis not present

## 2023-02-10 DIAGNOSIS — Z87440 Personal history of urinary (tract) infections: Secondary | ICD-10-CM

## 2023-02-10 DIAGNOSIS — N3941 Urge incontinence: Secondary | ICD-10-CM

## 2023-02-10 DIAGNOSIS — N390 Urinary tract infection, site not specified: Secondary | ICD-10-CM

## 2023-02-10 DIAGNOSIS — R350 Frequency of micturition: Secondary | ICD-10-CM

## 2023-02-10 DIAGNOSIS — R32 Unspecified urinary incontinence: Secondary | ICD-10-CM

## 2023-02-10 LAB — BLADDER SCAN AMB NON-IMAGING: Scan Result: 37

## 2023-02-10 MED ORDER — MIRABEGRON ER 50 MG PO TB24: 50.0000 mg | ORAL_TABLET | Freq: Every day | ORAL | 11 refills | Status: DC

## 2023-02-10 MED ORDER — NITROFURANTOIN MONOHYD MACRO 100 MG PO CAPS: 100.0000 mg | ORAL_CAPSULE | Freq: Two times a day (BID) | ORAL | 0 refills | Status: DC

## 2023-02-10 NOTE — Progress Notes (Signed)
02/10/2023 10:24 AM   Rush Barer Bochicchio [birth year] [id number]  Referring provider: Charlynne Pander, MD [address] [place],  [place] [address]  Followup recurrent UTI and urinary incontinence   HPI: Ms Wynes is a 82yo here for followup for recurrent UTI and urge incontinence. She has had 2 UTIs since last visit. UA today is concerning for infection. She noted mirabegron  improved her urinary urgency, frequency and urge incontinence. She is wishing to try an increased dose   PMH: Past Medical History:  Diagnosis Date   Anxiety    Arthritis    Bronchitis    Bronchitis    CKD (chronic kidney disease) stage 3, GFR 30-59 ml/min (HCC) 05/04/2016   Complication of anesthesia    pt states she had a cardiac arrest during hysterectomy 1985.  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)    pt states she has to have her creatinine level checked often    Low blood pressure 01/05/2022   Moderate single current episode of major depressive disorder (HCC) 03/12/2021   Nausea 03/03/2017   Pelvic pain 05/25/2022   Peripheral neuropathy    bilateral feet   Seasonal allergies 03/27/2019   Sinusitis 05/06/2018   UTI (urinary tract infection) 03/03/2017   Weight loss 03/03/2017    Surgical History: Past Surgical History:  Procedure Laterality Date   ABDOMINAL HYSTERECTOMY  1985   BACK SURGERY     BIOPSY  07/15/2020   Procedure: BIOPSY;  Surgeon: Corbin Ade, MD;  [facility]: [facility];  Service: Endoscopy;;   CATARACT EXTRACTION, BILATERAL     CHOLECYSTECTOMY     COLONOSCOPY   08/10/2002   NUR: Normal colonoscopy except for some hemorrhoids and some erythema at the dentate line   COLONOSCOPY  12/10/2006   Edwards:Diffuse colitis in the transverse and descending colon/This is not entirely typical of ischemic colitis or her HIV positive/ disease.  We need to be concerned about other causes   COLONOSCOPY  01/19/2011   RMR: Internal and external hemorrhoids, likely source of hematochezia anal papilla, otherwise normal rectal mucosa/ Left-sided diverticula.  Cecal polyp with status post cold snare polypectomy.  Remainder of colonic mucosa appeared normal. Pathology with tubular adenoma. Repeat in 2017   COLONOSCOPY WITH PROPOFOL N/A 07/15/2020   Procedure: COLONOSCOPY WITH PROPOFOL;  Surgeon: Corbin Ade, MD;  [facility]: [facility];  Service: Endoscopy;  Laterality: N/A;  8:15AM TCS via Ostomy   COLOSTOMY N/A 11/12/2018   Procedure: COLOSTOMY;  Surgeon: Lucretia Roers, MD;  Location: [facility];  Service: General;  Laterality: N/A;   COLOSTOMY REVERSAL N/A 10/02/2020   Procedure: COLOSTOMY REVERSAL;  Surgeon: Lucretia Roers, MD;  Location: [facility];  Service: General;  Laterality: N/A;   ESOPHAGOGASTRODUODENOSCOPY  08/10/2002     NUR: Mild changes of reflux esophagitis limited to gastroesophageal junction  No evidence of ring, stricture, or esophageal candidiasis dysphagia/ Gastritis, possibly H. pylori induced   ESOPHAGOGASTRODUODENOSCOPY  12/25/2002   NUR: Erosive antral gastritis.  The degree of gastritis is more  significant than her last exam/ Normal examination of the esophagus/ Esophageal dilatation performed by passing 56 and [address] [place]   ESOPHAGOGASTRODUODENOSCOPY  11/13/2010   RMR: Circumferential distal esophageal erosions with soft peptic stricture, consistent with erosive reflux esophagitis or stricture  formation, status post dilation as described above/  Small hiatal hernia/ Tiny antral erosions, otherwise normal stomach D1 and D2    ESOPHAGOGASTRODUODENOSCOPY (EGD) WITH PROPOFOL N/A 07/15/2020   Procedure: ESOPHAGOGASTRODUODENOSCOPY (EGD) WITH PROPOFOL;  Surgeon: Corbin Ade, MD;  [facility];  Service: Endoscopy;  Laterality: N/A;   FLEXIBLE SIGMOIDOSCOPY N/A 07/15/2020   Procedure: FLEXIBLE SIGMOIDOSCOPY;  Surgeon: Corbin Ade, MD;  [facility];  Service: Endoscopy;  Laterality: N/A;   HEMORRHOID SURGERY  09/02/2012   Procedure: HEMORRHOIDECTOMY;  Surgeon: Dalia Heading, MD;  Location: [facility];  Service: General;  Laterality: N/A;   LAPAROTOMY N/A 11/12/2018   Procedure: EXPLORATORY LAPAROTOMY;  Surgeon: Lucretia Roers, MD;  Location: [facility];  Service: General;  Laterality: N/A;   MALONEY DILATION N/A 07/15/2020   Procedure: Alvy Beal;  Surgeon: Corbin Ade, MD;  [facility];  Service: Endoscopy;  Laterality: N/A;   PARTIAL COLECTOMY N/A 11/12/2018   Procedure: PARTIAL COLECTOMY;  Surgeon: Lucretia Roers, MD;  Location: [facility];  Service: General;  Laterality: N/A;   POLYPECTOMY  07/15/2020   Procedure: POLYPECTOMY;  Surgeon: Corbin Ade, MD;  [facility];  Service: Endoscopy;;   TUBAL LIGATION      Home Medications:  Allergies as of 02/10/2023       Reactions   Bee Venom Anaphylaxis   Promethazine Swelling   Tenofovir Disoproxil Fumarate [tenofovir Disoproxil Fumarate] Other (See Comments)   Renal failure, (08/27/2006)   Compazine  [prochlorperazine Edisylate] Nausea And Vomiting   Haloperidol Lactate Other (See Comments)   Reaction is muscle tension, causes severe spasms in face and neck. Forces eyes to roll in the back of the head   Sulfa Antibiotics Diarrhea   Brompheniramine-acetaminophen Other (See Comments)   unknown   Chlorcyclizine Other (See Comments)   Unknown   Chlorpromazine Other (See Comments)   Unknown   Codeine Itching, Nausea And Vomiting   Navane [thiothixene (tiotixene)] Other (See Comments), Nausea And Vomiting   Same  muscle spasm reaction as haldol   Prednisone Other (See Comments)   Brief mild psychosis and agitation   Promethazine Hcl Other (See Comments)   Propoxyphene N-acetaminophen Itching, Nausea And Vomiting   Morphine Itching, Swelling        Medication List        Accurate as of February 10, 2023 10:24 AM. If you have any questions, ask your nurse or doctor.          Allergy Relief 10 MG tablet Generic drug: loratadine Take 10 mg by mouth daily.   ARIPiprazole 5 MG tablet Commonly known as: ABILIFY Take 5 mg by mouth daily.   atorvastatin 20 MG tablet Commonly known as: LIPITOR Take 20 mg by mouth daily.   Biktarvy 50-200-25 MG Tabs tablet Generic drug: bictegravir-emtricitabine-tenofovir AF Take 1 tablet by mouth daily. Please discontinue TRIUMEQ and TIVICAY   cephALEXin 250 MG capsule Commonly known as: KEFLEX Take 250 mg by mouth at bedtime.   clonazePAM 0.5 MG tablet Commonly known as: KLONOPIN Take 1 tablet (0.5 mg total) by mouth 2 (two) times daily.   Cranberry 250 MG Tabs Take 450 mg by mouth every morning.   docusate  sodium 100 MG capsule Commonly known as: COLACE Take 1 capsule (100 mg total) by mouth 2 (two) times daily.   DULoxetine 60 MG capsule Commonly known as: CYMBALTA Take 60 mg by mouth daily.   feeding supplement Liqd Take 237 mLs by mouth 2 (two) times daily between meals.   fluticasone 50 MCG/ACT nasal spray Commonly known as: FLONASE 2 SPRAYS INTO BOTH NOSTRILS AT BEDTIME. What changed: See the new instructions.   furosemide 40 MG tablet Commonly known as: LASIX Take 1 tablet (40 mg total) by mouth daily as needed for fluid. Hold until follow up with PCP What changed: additional instructions   losartan 25 MG tablet Commonly known as: COZAAR Take 12.5 mg by mouth daily.   Magnesium 100 MG Tabs Take 400 mg by mouth daily.   Menthol-Zinc Oxide 0.44-20.6 % Oint Apply 1 application. topically every 8 (eight) hours as needed  (protection). Apply to buttocks   Myrbetriq 25 MG Tb24 tablet Generic drug: mirabegron ER Take 25 mg by mouth daily.   ONE-A-DAY 55 PLUS PO Take 1 tablet by mouth daily.   oxymetazoline 0.05 % nasal spray Commonly known as: AFRIN Place 1 spray into both nostrils every 12 (twelve) hours as needed for congestion (Nose Bleeds).   pantoprazole 40 MG tablet Commonly known as: Protonix Take 1 tablet (40 mg total) by mouth daily before breakfast.   polyethylene glycol 17 g packet Commonly known as: MiraLax Take 17 g by mouth daily. Hold for diarrhea   polyvinyl alcohol 1.4 % ophthalmic solution Commonly known as: LIQUIFILM TEARS Place 1 drop into both eyes daily.   senna 8.6 MG tablet Commonly known as: SENOKOT Take 1 tablet by mouth 2 (two) times daily.   thiamine 100 MG tablet Commonly known as: Vitamin B-1 Take 100 mg by mouth daily.   Venlafaxine HCl 75 MG Tb24 Take 1 tablet by mouth daily.        Allergies:  Allergies  Allergen Reactions   Bee Venom Anaphylaxis   Promethazine Swelling   Tenofovir Disoproxil Fumarate [Tenofovir Disoproxil Fumarate] Other (See Comments)    Renal failure, (08/27/2006)   Compazine  [Prochlorperazine Edisylate] Nausea And Vomiting   Haloperidol Lactate Other (See Comments)    Reaction is muscle tension, causes severe spasms in face and neck. Forces eyes to roll in the back of the head   Sulfa Antibiotics Diarrhea   Brompheniramine-Acetaminophen Other (See Comments)    unknown   Chlorcyclizine Other (See Comments)    Unknown   Chlorpromazine Other (See Comments)    Unknown   Codeine Itching and Nausea And Vomiting   Navane [Thiothixene (Tiotixene)] Other (See Comments) and Nausea And Vomiting    Same muscle spasm reaction as haldol   Prednisone Other (See Comments)    Brief mild psychosis and agitation   Promethazine Hcl Other (See Comments)   Propoxyphene N-Acetaminophen Itching and Nausea And Vomiting   Morphine Itching and  Swelling    Family History: Family History  Problem Relation Age of Onset   Diabetes Mother    Diabetes Brother    Colon cancer Brother        Diagnosed > age 58    Social History:  reports that she has never smoked. She has never used smokeless tobacco. She reports that she does not drink alcohol and does not use drugs.  ROS: All other review of systems were reviewed and are negative except what is noted above in HPI  Physical Exam: BP 115/73  Pulse 75   Constitutional:  Alert and oriented, No acute distress. HEENT: [place] AT, moist mucus membranes.  Trachea midline, no masses. Cardiovascular: No clubbing, cyanosis, or edema. Respiratory: Normal respiratory effort, no increased work of breathing. GI: Abdomen is soft, nontender, nondistended, no abdominal masses GU: No CVA tenderness.  Lymph: No cervical or inguinal lymphadenopathy. Skin: No rashes, bruises or suspicious lesions. Neurologic: Grossly intact, no focal deficits, moving all 4 extremities. Psychiatric: Normal mood and affect.  Laboratory Data: Lab Results  Component Value Date   WBC 3.6 (L) 11/20/2022   HGB 12.7 11/20/2022   HCT 37.6 11/20/2022   MCV 95.2 11/20/2022   PLT 180 11/20/2022    Lab Results  Component Value Date   CREATININE 1.32 (H) 11/20/2022    No results found for: "PSA"  No results found for: "TESTOSTERONE"  Lab Results  Component Value Date   HGBA1C 4.9 09/30/2020    Urinalysis    Component Value Date/Time   COLORURINE AMBER (A) 11/17/2022 1418   APPEARANCEUR CLOUDY (A) 11/17/2022 1418   APPEARANCEUR Turbid (A) 01/20/2022 1450   LABSPEC 1.019 11/17/2022 1418   PHURINE 7.0 11/17/2022 1418   GLUCOSEU NEGATIVE 11/17/2022 1418   GLUCOSEU NEG mg/dL 16/07/9603 5409   HGBUR MODERATE (A) 11/17/2022 1418   HGBUR moderate 06/04/2010 1425   BILIRUBINUR NEGATIVE 11/17/2022 1418   BILIRUBINUR Negative 01/20/2022 1450   KETONESUR 5 (A) 11/17/2022 1418   PROTEINUR >=300 (A) 11/17/2022  1418   UROBILINOGEN 1.0 05/31/2014 0933   NITRITE POSITIVE (A) 11/17/2022 1418   LEUKOCYTESUR MODERATE (A) 11/17/2022 1418    Lab Results  Component Value Date   LABMICR See below: 01/20/2022   WBCUA >30 (H) 01/20/2022   LABEPIT 0-10 01/20/2022   MUCUS Present 12/23/2021   BACTERIA MANY (A) 11/17/2022    Pertinent Imaging:  Results for orders placed during the hospital encounter of 06/20/21  DG Abd 1 View  Narrative CLINICAL DATA:  Nasogastric tube.  EXAM: ABDOMEN - 1 VIEW  COMPARISON:  None.  FINDINGS: The bowel gas pattern is normal. Distal tip of nasogastric tube is seen in the stomach. Status post cholecystectomy. No radio-opaque calculi or other significant radiographic abnormality are seen.  IMPRESSION: Distal tip of nasogastric tube seen in stomach.   Electronically Signed By: Lupita Raider M.D. On: 06/22/2021 08:09  No results found for this or any previous visit.  No results found for this or any previous visit.  No results found for this or any previous visit.  Results for orders placed during the hospital encounter of 08/10/06  US Renal  Narrative Clinical Data: 82 year old with rising creatinine. RENAL/URINARY TRACT ULTRASOUND: Technique: Complete ultrasound examination of the urinary tract was performed including evaluation of the kidneys, renal collecting systems, and urinary bladder. Comparison: None. Findings: The right kidney measures 10.1 cm and the left kidney measures 12.2 cm. Left kidney demonstrates increased echogenicity of the renal parenchyma and mild caliectasis, but no obvious hydronephrosis. No renal masses or perinephric fluid collections. Bladder is unremarkable.  Impression Echogenic left kidney with mild caliectasis but no obvious hydronephrosis. The right kidney appears normal.  Provider: Senaida Ores  No valid procedures specified. No results found for this or any previous visit.  No results found for this or any  previous visit.   Assessment & Plan:    1. Recurrent UTI -Urine for culture -macrobid 100mg  BID for 7 days - BLADDER SCAN AMB NON-IMAGING - Urinalysis, Routine w reflex microscopic  2. OAB -Mirabegron  50mg  daily  No follow-ups on file.  Wilkie Aye, MD  [facility] [place]

## 2023-02-10 NOTE — Progress Notes (Signed)
post void residual= 37 

## 2023-02-14 ENCOUNTER — Encounter: Payer: Self-pay | Admitting: Urology

## 2023-02-14 NOTE — Patient Instructions (Signed)

## 2023-05-31 ENCOUNTER — Encounter: Payer: Self-pay | Admitting: Infectious Disease

## 2023-05-31 ENCOUNTER — Other Ambulatory Visit: Payer: Self-pay

## 2023-05-31 ENCOUNTER — Ambulatory Visit (INDEPENDENT_AMBULATORY_CARE_PROVIDER_SITE_OTHER): Payer: Medicare Other | Admitting: Infectious Disease

## 2023-05-31 VITALS — BP 116/78 | HR 72 | Temp 98.0°F

## 2023-05-31 DIAGNOSIS — N1832 Chronic kidney disease, stage 3b: Secondary | ICD-10-CM | POA: Diagnosis not present

## 2023-05-31 DIAGNOSIS — E785 Hyperlipidemia, unspecified: Secondary | ICD-10-CM | POA: Diagnosis not present

## 2023-05-31 DIAGNOSIS — B20 Human immunodeficiency virus [HIV] disease: Secondary | ICD-10-CM

## 2023-05-31 DIAGNOSIS — F03918 Unspecified dementia, unspecified severity, with other behavioral disturbance: Secondary | ICD-10-CM

## 2023-05-31 HISTORY — DX: Unspecified dementia, unspecified severity, with other behavioral disturbance: F03.918

## 2023-05-31 LAB — CBC WITH DIFFERENTIAL/PLATELET
Absolute Monocytes: 444 cells/uL (ref 200–950)
Basophils Absolute: 30 cells/uL (ref 0–200)
Basophils Relative: 0.4 %
Eosinophils Absolute: 118 cells/uL (ref 15–500)
Eosinophils Relative: 1.6 %
HCT: 40.3 % (ref 35.0–45.0)
Hemoglobin: 14 g/dL (ref 11.7–15.5)
Lymphs Abs: 2176 cells/uL (ref 850–3900)
MCH: 33.3 pg — ABNORMAL HIGH (ref 27.0–33.0)
MCHC: 34.7 g/dL (ref 32.0–36.0)
MCV: 95.7 fL (ref 80.0–100.0)
MPV: 9.8 fL (ref 7.5–12.5)
Monocytes Relative: 6 %
Neutro Abs: 4632 cells/uL (ref 1500–7800)
Neutrophils Relative %: 62.6 %
Platelets: 234 10*3/uL (ref 140–400)
RBC: 4.21 10*6/uL (ref 3.80–5.10)
RDW: 12.6 % (ref 11.0–15.0)
Total Lymphocyte: 29.4 %
WBC: 7.4 10*3/uL (ref 3.8–10.8)

## 2023-05-31 NOTE — Addendum Note (Signed)
Addended by: Linna Hoff D on: 05/31/2023 11:18 AM   Modules accepted: Orders
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
## 2023-08-09 ENCOUNTER — Encounter: Payer: Self-pay | Admitting: Urology

## 2023-08-09 ENCOUNTER — Ambulatory Visit (INDEPENDENT_AMBULATORY_CARE_PROVIDER_SITE_OTHER): Payer: Medicare (Managed Care) | Admitting: Urology

## 2023-08-09 VITALS — BP 109/73 | HR 80

## 2023-08-09 DIAGNOSIS — R32 Unspecified urinary incontinence: Secondary | ICD-10-CM | POA: Diagnosis not present

## 2023-08-09 DIAGNOSIS — N390 Urinary tract infection, site not specified: Secondary | ICD-10-CM

## 2023-08-09 DIAGNOSIS — Z87440 Personal history of urinary (tract) infections: Secondary | ICD-10-CM

## 2023-08-09 LAB — BLADDER SCAN AMB NON-IMAGING: Scan Result: 17

## 2023-08-09 MED ORDER — CEPHALEXIN 250 MG PO CAPS: 250.0000 mg | ORAL_CAPSULE | Freq: Every evening | ORAL | 11 refills | Status: DC

## 2023-08-09 NOTE — Progress Notes (Signed)
08/09/2023 2:31 PM   [name] 16-Oct-1941 [id number]  Referring provider: Charlynne Pander, MD [address] [place],  [place] [address]  Followup urinary incontinence   HPI: [name] is a 82yo here for followup for recurrent UTi and urinary incontinence. Last visit we increased her mirabegron to 50mg  daily. She uses 4 diapers per day. She has intermittent dysuria and urinary urgency. She has issues with constipation. She denies any UTIs since last visit. She is on keflex 250mg  at bedtime for prophylaxis   PMH: Past Medical History:  Diagnosis Date   Anxiety    Arthritis    Bronchitis    Bronchitis    CKD (chronic kidney disease) stage 3, GFR 30-59 ml/min (HCC) 05/04/2016   Complication of anesthesia    pt states she had a cardiac arrest during hysterectomy 1985.  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)    pt states she has to have her creatinine level checked often    Low blood pressure 01/05/2022   Moderate single current episode of major depressive disorder (HCC) 03/12/2021   Nausea 03/03/2017   Pelvic pain 05/25/2022   Peripheral neuropathy    bilateral feet   Seasonal allergies 03/27/2019   Sinusitis 05/06/2018   UTI (urinary tract infection) 03/03/2017   Weight loss 03/03/2017    Surgical History: Past Surgical History:  Procedure Laterality Date   ABDOMINAL HYSTERECTOMY  1985   BACK SURGERY     BIOPSY  07/15/2020   Procedure: BIOPSY;  Surgeon: Corbin Ade, MD;  [facility]: [facility];  Service: Endoscopy;;   CATARACT EXTRACTION, BILATERAL     CHOLECYSTECTOMY     COLONOSCOPY  08/10/2002   NUR: Normal colonoscopy except for some hemorrhoids and some erythema at the dentate line   COLONOSCOPY  12/10/2006   Edwards:Diffuse colitis in the transverse and descending colon/This is not entirely typical of ischemic colitis or her HIV positive/ disease.  We need to be concerned about other causes   COLONOSCOPY  01/19/2011   RMR: Internal and external hemorrhoids, likely source of hematochezia anal papilla, otherwise normal rectal mucosa/ Left-sided diverticula.  Cecal polyp with status post cold snare polypectomy.  Remainder of colonic mucosa appeared normal. Pathology with tubular adenoma. Repeat in 2017   COLONOSCOPY WITH PROPOFOL N/A 07/15/2020   Procedure: COLONOSCOPY WITH PROPOFOL;  Surgeon: Corbin Ade, MD;  [facility]: [facility];  Service: Endoscopy;  Laterality: N/A;  8:15AM TCS via Ostomy   COLOSTOMY N/A 11/12/2018   Procedure: COLOSTOMY;  Surgeon: Lucretia Roers, MD;  Location: [facility];  Service: General;  Laterality: N/A;   COLOSTOMY REVERSAL N/A 10/02/2020   Procedure: COLOSTOMY REVERSAL;  Surgeon: Lucretia Roers, MD;  Location: [facility];  Service: General;  Laterality: N/A;   ESOPHAGOGASTRODUODENOSCOPY  08/10/2002     NUR: Mild changes of reflux esophagitis limited to gastroesophageal junction  No evidence of ring, stricture, or esophageal candidiasis dysphagia/ Gastritis, possibly H. pylori induced   ESOPHAGOGASTRODUODENOSCOPY  12/25/2002   NUR: Erosive antral gastritis.  The degree of gastritis is more significant than her last exam/ Normal examination of the esophagus/ Esophageal dilatation performed by passing 56 and [address] [place]   ESOPHAGOGASTRODUODENOSCOPY  11/13/2010   RMR: Circumferential distal esophageal erosions with soft peptic stricture, consistent with erosive reflux esophagitis or stricture  formation, status post dilation as  described above/  Small hiatal hernia/ Tiny antral erosions, otherwise normal stomach D1 and D2   ESOPHAGOGASTRODUODENOSCOPY (EGD) WITH PROPOFOL N/A 07/15/2020   Procedure: ESOPHAGOGASTRODUODENOSCOPY (EGD) WITH PROPOFOL;  Surgeon: Corbin Ade, MD;  [facility];  Service: Endoscopy;  Laterality: N/A;   FLEXIBLE SIGMOIDOSCOPY N/A 07/15/2020   Procedure: FLEXIBLE SIGMOIDOSCOPY;  Surgeon: Corbin Ade, MD;  [facility];  Service: Endoscopy;  Laterality: N/A;   HEMORRHOID SURGERY  09/02/2012   Procedure: HEMORRHOIDECTOMY;  Surgeon: Dalia Heading, MD;  Location: [facility];  Service: General;  Laterality: N/A;   LAPAROTOMY N/A 11/12/2018   Procedure: EXPLORATORY LAPAROTOMY;  Surgeon: Lucretia Roers, MD;  Location: [facility];  Service: General;  Laterality: N/A;   MALONEY DILATION N/A 07/15/2020   Procedure: Alvy Beal;  Surgeon: Corbin Ade, MD;  [facility];  Service: Endoscopy;  Laterality: N/A;   PARTIAL COLECTOMY N/A 11/12/2018   Procedure: PARTIAL COLECTOMY;  Surgeon: Lucretia Roers, MD;  Location: [facility];  Service: General;  Laterality: N/A;   POLYPECTOMY  07/15/2020   Procedure: POLYPECTOMY;  Surgeon: Corbin Ade, MD;  [facility];  Service: Endoscopy;;   TUBAL LIGATION      Home Medications:  Allergies as of 08/09/2023       Reactions   Bee Venom Anaphylaxis   Promethazine Swelling   Tenofovir Disoproxil Fumarate [tenofovir Disoproxil Fumarate] Other (See Comments)   Renal failure, (08/27/2006)   Compazine  [prochlorperazine Edisylate] Nausea And Vomiting   Haloperidol Lactate Other (See Comments)   Reaction is muscle tension, causes severe spasms in face and neck. Forces eyes to roll in the back of the head   Sulfa Antibiotics Diarrhea   Brompheniramine-acetaminophen Other (See Comments)   unknown   Chlorcyclizine Other (See Comments)   Unknown   Chlorpromazine Other (See Comments)   Unknown   Codeine Itching,  Nausea And Vomiting   Navane [thiothixene (tiotixene)] Other (See Comments), Nausea And Vomiting   Same muscle spasm reaction as haldol   Prednisone Other (See Comments)   Brief mild psychosis and agitation   Promethazine Hcl Other (See Comments)   Propoxyphene N-acetaminophen Itching, Nausea And Vomiting   Morphine Itching, Swelling        Medication List        Accurate as of August 09, 2023  2:31 PM. If you have any questions, ask your nurse or doctor.          Allergy Relief 10 MG tablet Generic drug: loratadine Take 10 mg by mouth daily.   ARIPiprazole 5 MG tablet Commonly known as: ABILIFY Take 5 mg by mouth daily.   atorvastatin 20 MG tablet Commonly known as: LIPITOR Take 20 mg by mouth daily.   Biktarvy 50-200-25 MG Tabs tablet Generic drug: bictegravir-emtricitabine-tenofovir AF Take 1 tablet by mouth daily. Please discontinue TRIUMEQ and TIVICAY   cephALEXin 250 MG capsule Commonly known as: KEFLEX Take 250 mg by mouth at bedtime.   clonazePAM 0.5 MG tablet Commonly known as: KLONOPIN Take 1 tablet (0.5 mg total) by mouth 2 (two) times daily.  Cranberry 250 MG Tabs Take 450 mg by mouth every morning.   docusate sodium 100 MG capsule Commonly known as: COLACE Take 1 capsule (100 mg total) by mouth 2 (two) times daily.   donepezil 5 MG tablet Commonly known as: ARICEPT   DULoxetine 60 MG capsule Commonly known as: CYMBALTA Take 60 mg by mouth daily.   feeding supplement Liqd Take 237 mLs by mouth 2 (two) times daily between meals.   fluticasone 50 MCG/ACT nasal spray Commonly known as: FLONASE 2 SPRAYS INTO BOTH NOSTRILS AT BEDTIME. What changed: See the new instructions.   furosemide 40 MG tablet Commonly known as: LASIX Take 1 tablet (40 mg total) by mouth daily as needed for fluid. Hold until follow up with PCP What changed: additional instructions   losartan 25 MG tablet Commonly known as: COZAAR Take 12.5 mg by mouth daily.    Magnesium 100 MG Tabs Take 400 mg by mouth daily.   Menthol-Zinc Oxide 0.44-20.6 % Oint Apply 1 application. topically every 8 (eight) hours as needed (protection). Apply to buttocks   mirabegron ER 50 MG Tb24 tablet Commonly known as: MYRBETRIQ Take 1 tablet (50 mg total) by mouth daily.   nitrofurantoin (macrocrystal-monohydrate) 100 MG capsule Commonly known as: MACROBID Take 1 capsule (100 mg total) by mouth every 12 (twelve) hours.   ONE-A-DAY 55 PLUS PO Take 1 tablet by mouth daily.   oxymetazoline 0.05 % nasal spray Commonly known as: AFRIN Place 1 spray into both nostrils every 12 (twelve) hours as needed for congestion (Nose Bleeds).   pantoprazole 40 MG tablet Commonly known as: Protonix Take 1 tablet (40 mg total) by mouth daily before breakfast.   polyethylene glycol 17 g packet Commonly known as: MiraLax Take 17 g by mouth daily. Hold for diarrhea   polyvinyl alcohol 1.4 % ophthalmic solution Commonly known as: LIQUIFILM TEARS Place 1 drop into both eyes daily.   senna 8.6 MG tablet Commonly known as: SENOKOT Take 1 tablet by mouth 2 (two) times daily.   thiamine 100 MG tablet Commonly known as: Vitamin B-1 Take 100 mg by mouth daily.   Venlafaxine HCl 75 MG Tb24 Take 1 tablet by mouth daily.        Allergies:  Allergies  Allergen Reactions   Bee Venom Anaphylaxis   Promethazine Swelling   Tenofovir Disoproxil Fumarate [Tenofovir Disoproxil Fumarate] Other (See Comments)    Renal failure, (08/27/2006)   Compazine  [Prochlorperazine Edisylate] Nausea And Vomiting   Haloperidol Lactate Other (See Comments)    Reaction is muscle tension, causes severe spasms in face and neck. Forces eyes to roll in the back of the head   Sulfa Antibiotics Diarrhea   Brompheniramine-Acetaminophen Other (See Comments)    unknown   Chlorcyclizine Other (See Comments)    Unknown   Chlorpromazine Other (See Comments)    Unknown   Codeine Itching and Nausea And  Vomiting   Navane [Thiothixene (Tiotixene)] Other (See Comments) and Nausea And Vomiting    Same muscle spasm reaction as haldol   Prednisone Other (See Comments)    Brief mild psychosis and agitation   Promethazine Hcl Other (See Comments)   Propoxyphene N-Acetaminophen Itching and Nausea And Vomiting   Morphine Itching and Swelling    Family History: Family History  Problem Relation Age of Onset   Diabetes Mother    Diabetes Brother    Colon cancer Brother        Diagnosed > age 37    Social History:  reports that she has never smoked. She has never used smokeless tobacco. She reports that she does not drink alcohol and does not use drugs.  ROS: All other review of systems were reviewed and are negative except what is noted above in HPI  Physical Exam: BP 109/73   Pulse 80   Constitutional:  Alert and oriented, No acute distress. HEENT: [place] AT, moist mucus membranes.  Trachea midline, no masses. Cardiovascular: No clubbing, cyanosis, or edema. Respiratory: Normal respiratory effort, no increased work of breathing. GI: Abdomen is soft, nontender, nondistended, no abdominal masses GU: No CVA tenderness.  Lymph: No cervical or inguinal lymphadenopathy. Skin: No rashes, bruises or suspicious lesions. Neurologic: Grossly intact, no focal deficits, moving all 4 extremities. Psychiatric: Normal mood and affect.  Laboratory Data: Lab Results  Component Value Date   WBC 7.4 05/31/2023   HGB 14.0 05/31/2023   HCT 40.3 05/31/2023   MCV 95.7 05/31/2023   PLT 234 05/31/2023    Lab Results  Component Value Date   CREATININE 1.54 (H) 05/31/2023    No results found for: "PSA"  No results found for: "TESTOSTERONE"  Lab Results  Component Value Date   HGBA1C 4.9 09/30/2020    Urinalysis    Component Value Date/Time   COLORURINE AMBER (A) 11/17/2022 1418   APPEARANCEUR CLOUDY (A) 11/17/2022 1418   APPEARANCEUR Turbid (A) 01/20/2022 1450   LABSPEC 1.019 11/17/2022  1418   PHURINE 7.0 11/17/2022 1418   GLUCOSEU NEGATIVE 11/17/2022 1418   GLUCOSEU NEG mg/dL 86/57/8469 6295   HGBUR MODERATE (A) 11/17/2022 1418   HGBUR moderate 06/04/2010 1425   BILIRUBINUR NEGATIVE 11/17/2022 1418   BILIRUBINUR Negative 01/20/2022 1450   KETONESUR 5 (A) 11/17/2022 1418   PROTEINUR >=300 (A) 11/17/2022 1418   UROBILINOGEN 1.0 05/31/2014 0933   NITRITE POSITIVE (A) 11/17/2022 1418   LEUKOCYTESUR MODERATE (A) 11/17/2022 1418    Lab Results  Component Value Date   LABMICR See below: 01/20/2022   WBCUA >30 (H) 01/20/2022   LABEPIT 0-10 01/20/2022   MUCUS Present 12/23/2021   BACTERIA MANY (A) 11/17/2022    Pertinent Imaging:  Results for orders placed during the hospital encounter of 06/20/21  DG Abd 1 View  Narrative CLINICAL DATA:  Nasogastric tube.  EXAM: ABDOMEN - 1 VIEW  COMPARISON:  None.  FINDINGS: The bowel gas pattern is normal. Distal tip of nasogastric tube is seen in the stomach. Status post cholecystectomy. No radio-opaque calculi or other significant radiographic abnormality are seen.  IMPRESSION: Distal tip of nasogastric tube seen in stomach.   Electronically Signed By: Lupita Raider M.D. On: 06/22/2021 08:09  No results found for this or any previous visit.  No results found for this or any previous visit.  No results found for this or any previous visit.  Results for orders placed during the hospital encounter of 08/10/06  US Renal  Narrative Clinical Data: 82 year old with rising creatinine. RENAL/URINARY TRACT ULTRASOUND: Technique: Complete ultrasound examination of the urinary tract was performed including evaluation of the kidneys, renal collecting systems, and urinary bladder. Comparison: None. Findings: The right kidney measures 10.1 cm and the left kidney measures 12.2 cm. Left kidney demonstrates increased echogenicity of the renal parenchyma and mild caliectasis, but no obvious hydronephrosis. No renal  masses or perinephric fluid collections. Bladder is unremarkable.  Impression Echogenic left kidney with mild caliectasis but no obvious hydronephrosis. The right kidney appears normal.  Provider: Senaida Ores  No valid procedures specified. No results found for  this or any previous visit.  No results found for this or any previous visit.   Assessment & Plan:    1. Recurrent UTI -continue keflex 250mg  qhs - BLADDER SCAN AMB NON-IMAGING  2. Incontinence of urine in female Continue mirabegron 50mg  daily   No follow-ups on file.  Wilkie Aye, MD  [facility] [place]

## 2023-08-09 NOTE — Progress Notes (Signed)
post void residual=17

## 2023-08-09 NOTE — Patient Instructions (Signed)

## 2023-09-16 ENCOUNTER — Other Ambulatory Visit: Payer: Self-pay

## 2023-09-16 ENCOUNTER — Emergency Department (HOSPITAL_COMMUNITY): Payer: PRIVATE HEALTH INSURANCE

## 2023-09-16 ENCOUNTER — Emergency Department (HOSPITAL_COMMUNITY)
Admission: EM | Admit: 2023-09-16 | Discharge: 2023-09-16 | Disposition: A | Discharge status: Home / Self Care | Payer: PRIVATE HEALTH INSURANCE | Attending: Emergency Medicine | Admitting: Emergency Medicine

## 2023-09-16 DIAGNOSIS — N3001 Acute cystitis with hematuria: Secondary | ICD-10-CM | POA: Insufficient documentation

## 2023-09-16 DIAGNOSIS — R1030 Lower abdominal pain, unspecified: Secondary | ICD-10-CM | POA: Diagnosis present

## 2023-09-16 LAB — URINALYSIS, ROUTINE W REFLEX MICROSCOPIC
Bilirubin Urine: NEGATIVE
Glucose, UA: NEGATIVE mg/dL
Ketones, ur: NEGATIVE mg/dL
Nitrite: NEGATIVE
Protein, ur: >=300 mg/dL — AB
RBC / HPF: >50 RBC/hpf (ref 0–5)
Specific Gravity, Urine: 1.02 (ref 1.005–1.030)
WBC, UA: >50 WBC/hpf (ref 0–5)
pH: 7 (ref 5.0–8.0)

## 2023-09-16 LAB — COMPREHENSIVE METABOLIC PANEL
ALT: 15 U/L (ref 0–44)
AST: 18 U/L (ref 15–41)
Albumin: 4 g/dL (ref 3.5–5.0)
Alkaline Phosphatase: 96 U/L (ref 38–126)
Anion gap: 9 (ref 5–15)
BUN: 35 mg/dL — ABNORMAL HIGH (ref 8–23)
CO2: 26 mmol/L (ref 22–32)
Calcium: 9.8 mg/dL (ref 8.9–10.3)
Chloride: 104 mmol/L (ref 98–111)
Creatinine, Ser: 1.64 mg/dL — ABNORMAL HIGH (ref 0.44–1.00)
GFR, Estimated: 31 mL/min — ABNORMAL LOW (ref >60)
Glucose, Bld: 114 mg/dL — ABNORMAL HIGH (ref 70–99)
Potassium: 3.7 mmol/L (ref 3.5–5.1)
Sodium: 139 mmol/L (ref 135–145)
Total Bilirubin: 1 mg/dL (ref <1.2)
Total Protein: 7.1 g/dL (ref 6.5–8.1)

## 2023-09-16 LAB — CBC
HCT: 40 % (ref 36.0–46.0)
Hemoglobin: 13.5 g/dL (ref 12.0–15.0)
MCH: 32.5 pg (ref 26.0–34.0)
MCHC: 33.8 g/dL (ref 30.0–36.0)
MCV: 96.4 fL (ref 80.0–100.0)
Platelets: 171 10*3/uL (ref 150–400)
RBC: 4.15 MIL/uL (ref 3.87–5.11)
RDW: 12.9 % (ref 11.5–15.5)
WBC: 6.8 10*3/uL (ref 4.0–10.5)
nRBC: 0 % (ref 0.0–0.2)

## 2023-09-16 LAB — LIPASE, BLOOD: Lipase: 36 U/L (ref 11–51)

## 2023-09-16 MED ORDER — SODIUM CHLORIDE 0.9 % IV SOLN
2.0000 g | Freq: Once | INTRAVENOUS | Status: AC
  Administered 2023-09-16: 2 g via INTRAVENOUS
  Filled 2023-09-16: qty 20

## 2023-09-16 MED ORDER — IOHEXOL 300 MG/ML  SOLN
80.0000 mL | Freq: Once | INTRAMUSCULAR | Status: AC | PRN
  Administered 2023-09-16: 80 mL via INTRAVENOUS

## 2023-09-16 MED ORDER — CIPROFLOXACIN HCL 500 MG PO TABS: 500.0000 mg | ORAL_TABLET | Freq: Two times a day (BID) | ORAL | 0 refills | Status: DC

## 2023-09-16 NOTE — ED Notes (Signed)
Reviewed D/C information with the patient, pt verbalized understanding. No additional concerns at this time.

## 2023-09-16 NOTE — ED Notes (Signed)
Pt given [place] sandwhich per MD.  Pt daughter at bedside

## 2023-09-16 NOTE — Discharge Instructions (Signed)
Take Motrin as needed for pain.  Follow-up with your doctor after the antibiotics completed

## 2023-09-16 NOTE — ED Triage Notes (Signed)
Pt c/o abd pain and constipation. Pt arrived [facility] from [facility] with a hx of schizophrenia and thoughts she may have worms coming out of her. Staff only c/o abd pain and a bowel movement today.

## 2023-09-16 NOTE — ED Notes (Signed)
Pt provided a sandwich and water by Amil Amen, NT.  Ok'd by Dr. Estell Harpin.

## 2023-09-17 NOTE — ED Provider Notes (Signed)
[place] EMERGENCY DEPARTMENT AT [facility] Provider Note   CSN: [id number] Arrival date & time: 09/16/23  1642     History  Chief Complaint  Patient presents with   Abdominal Pain    [name] is a 82 y.o. adult.  Patient complains of suprapubic pain.  She has a history of dementia  The history is provided by the patient. No language interpreter was used.  Abdominal Pain Pain location:  Suprapubic Pain quality: aching   Pain radiates to:  Does not radiate Pain severity:  Moderate Onset quality:  Sudden Timing:  Constant Progression:  Worsening Chronicity:  New Context: not alcohol use   Relieved by:  Nothing Worsened by:  Nothing Associated symptoms: no chest pain, no cough, no diarrhea, no fatigue and no hematuria        Home Medications Prior to Admission medications   Medication Sig Start Date End Date Taking? Authorizing Provider  ciprofloxacin (CIPRO) 500 MG tablet Take 1 tablet (500 mg total) by mouth 2 (two) times daily. One po bid x 7 days 09/16/23  Yes Bethann Berkshire, MD  mirtazapine (REMERON) 7.5 MG tablet Take 7.5 mg by mouth at bedtime. 08/03/23  Yes [provider]  pantoprazole (PROTONIX) 20 MG tablet Take 20 mg by mouth daily. 08/31/23  Yes [provider]  ALLERGY RELIEF 10 MG tablet Take 10 mg by mouth daily. 05/02/21   [provider]  ARIPiprazole (ABILIFY) 5 MG tablet Take 5 mg by mouth daily. 10/17/21   [provider]  atorvastatin (LIPITOR) 20 MG tablet Take 20 mg by mouth daily.    [provider]  bictegravir-emtricitabine-tenofovir AF (BIKTARVY) 50-200-25 MG TABS tablet Take 1 tablet by mouth daily. Please discontinue TRIUMEQ and TIVICAY 05/25/22   Daiva Eves, Lisette Grinder, MD  cephALEXin (KEFLEX) 250 MG capsule Take 1 capsule (250 mg total) by mouth at bedtime. 08/09/23   McKenzie, Mardene Celeste, MD  clonazePAM (KLONOPIN) 0.5 MG tablet Take 1 tablet (0.5 mg total) by mouth 2 (two) times  daily. 11/20/22   Sherryll Burger, Pratik D, DO  Cranberry 250 MG TABS Take 450 mg by mouth every morning.    [provider]  docusate sodium (COLACE) 100 MG capsule Take 1 capsule (100 mg total) by mouth 2 (two) times daily. 11/21/18   Lucretia Roers, MD  donepezil (ARICEPT) 5 MG tablet  05/05/23   [provider]  DULoxetine (CYMBALTA) 60 MG capsule Take 60 mg by mouth daily. 02/28/21   [provider]  feeding supplement (ENSURE ENLIVE / ENSURE PLUS) LIQD Take 237 mLs by mouth 2 (two) times daily between meals. 01/09/22   Dahal, Melina Schools, MD  fluticasone (FLONASE) 50 MCG/ACT nasal spray 2 SPRAYS INTO BOTH NOSTRILS AT BEDTIME. Patient taking differently: Place 2 sprays into both nostrils at bedtime. 05/22/19   Randall Hiss, MD  furosemide (LASIX) 40 MG tablet Take 1 tablet (40 mg total) by mouth daily as needed for fluid. Hold until follow up with PCP Patient taking differently: Take 40 mg by mouth daily as needed for fluid. 12/31/19   Vassie Loll, MD  losartan (COZAAR) 25 MG tablet Take 12.5 mg by mouth daily. 05/02/21   [provider]  Magnesium 100 MG TABS Take 400 mg by mouth daily.    [provider]  Menthol-Zinc Oxide 0.44-20.6 % OINT Apply 1 application. topically every 8 (eight) hours as needed (protection). Apply to buttocks    [provider]  mirabegron  ER (MYRBETRIQ) 50 MG TB24 tablet Take 1 tablet (50 mg total) by mouth daily. 02/10/23   McKenzie, Mardene Celeste, MD  Multiple Vitamin (ONE-A-DAY 55 PLUS PO) Take 1 tablet by mouth daily.    [provider]  nitrofurantoin, macrocrystal-monohydrate, (MACROBID) 100 MG capsule Take 1 capsule (100 mg total) by mouth every 12 (twelve) hours. 02/10/23   McKenzie, Mardene Celeste, MD  oxymetazoline (AFRIN) 0.05 % nasal spray Place 1 spray into both nostrils every 12 (twelve) hours as needed for congestion (Nose Bleeds).    [provider]  polyethylene glycol (MIRALAX) 17 g packet Take 17 g  by mouth daily. Hold for diarrhea 06/26/21   Vassie Loll, MD  polyvinyl alcohol (LIQUIFILM TEARS) 1.4 % ophthalmic solution Place 1 drop into both eyes daily.    [provider]  senna (SENOKOT) 8.6 MG tablet Take 1 tablet by mouth 2 (two) times daily.    [provider]  thiamine (VITAMIN B-1) 100 MG tablet Take 100 mg by mouth daily.    [provider]  Venlafaxine HCl 75 MG TB24 Take 1 tablet by mouth daily. 12/02/22   [provider]      Allergies    Bee venom, Promethazine, Tenofovir disoproxil fumarate [tenofovir disoproxil fumarate], Compazine  [prochlorperazine edisylate], Haloperidol lactate, Sulfa antibiotics, Brompheniramine-acetaminophen, Chlorcyclizine, Chlorpromazine, Codeine, Navane [thiothixene (tiotixene)], Prednisone, Promethazine hcl, Propoxyphene n-acetaminophen, and Morphine    Review of Systems   Review of Systems  Constitutional:  Negative for appetite change and fatigue.  HENT:  Negative for congestion, ear discharge and sinus pressure.   Eyes:  Negative for discharge.  Respiratory:  Negative for cough.   Cardiovascular:  Negative for chest pain.  Gastrointestinal:  Positive for abdominal pain. Negative for diarrhea.  Genitourinary:  Negative for frequency and hematuria.  Musculoskeletal:  Negative for back pain.  Skin:  Negative for rash.  Neurological:  Negative for seizures and headaches.  Psychiatric/Behavioral:  Negative for hallucinations.     Physical Exam Updated Vital Signs BP (!) 140/52   Pulse 76   Temp 97.9 F (36.6 C) (Oral)   Resp 17   Ht 5\' 7"  (1.702 m)   Wt 58.1 kg   SpO2 100%   BMI 20.06 kg/m  Physical Exam Vitals and nursing note reviewed.  Constitutional:      Appearance: She is well-developed.  HENT:     Head: Normocephalic.     Nose: Nose normal.  Eyes:     General: No scleral icterus.    Conjunctiva/sclera: Conjunctivae normal.  Neck:     Thyroid: No thyromegaly.  Cardiovascular:      Rate and Rhythm: Normal rate and regular rhythm.     Heart sounds: No murmur heard.    No friction rub. No gallop.  Pulmonary:     Breath sounds: No stridor. No wheezing or rales.  Chest:     Chest wall: No tenderness.  Abdominal:     General: There is no distension.     Tenderness: There is abdominal tenderness. There is no rebound.  Musculoskeletal:        General: Normal range of motion.     Cervical back: Neck supple.  Lymphadenopathy:     Cervical: No cervical adenopathy.  Skin:    Findings: No erythema or rash.  Neurological:     Mental Status: She is alert and oriented to person, place, and time.     Motor: No abnormal muscle tone.     Coordination: Coordination normal.  Psychiatric:        Behavior: Behavior normal.     ED Results / Procedures / Treatments   Labs (all labs ordered are listed, but only abnormal results are displayed) Labs Reviewed  COMPREHENSIVE METABOLIC PANEL - Abnormal; Notable for the following components:      Result Value   Glucose, Bld 114 (*)    BUN 35 (*)    Creatinine, Ser 1.64 (*)    GFR, Estimated 31 (*)    All other components within normal limits  URINALYSIS, ROUTINE W REFLEX MICROSCOPIC - Abnormal; Notable for the following components:   Color, Urine AMBER (*)    APPearance TURBID (*)    Hgb urine dipstick MODERATE (*)    Protein, ur >=300 (*)    Leukocytes,Ua SMALL (*)    Bacteria, UA MANY (*)    All other components within normal limits  URINE CULTURE  LIPASE, BLOOD  CBC    EKG None  Radiology CT ABDOMEN PELVIS W CONTRAST  Result Date: 09/16/2023 CLINICAL DATA:  Acute abdominal pain. EXAM: CT ABDOMEN AND PELVIS WITH CONTRAST TECHNIQUE: Multidetector CT imaging of the abdomen and pelvis was performed using the standard protocol following bolus administration of intravenous contrast. RADIATION DOSE REDUCTION: This exam was performed according to the departmental dose-optimization program which includes automated exposure  control, adjustment of the mA and/or kV according to patient size and/or use of iterative reconstruction technique. CONTRAST:  80mL OMNIPAQUE IOHEXOL 300 MG/ML  SOLN COMPARISON:  CT 01/05/2022, additional priors reviewed FINDINGS: Lower chest: No acute findings. Hepatobiliary: No focal liver abnormality is seen. Status post cholecystectomy. No biliary dilatation. Pancreas: Parenchymal atrophy. No ductal dilatation or inflammation. Spleen: Normal in size without focal abnormality. Adrenals/Urinary Tract: Unchanged 2 cm left adrenal nodule, meeting no further imaging follow-up. Normal right adrenal gland. Bilateral renal parenchymal thinning. No hydronephrosis. No suspicious renal abnormality. No renal calculi. Homogeneous enhancement. The urinary bladder is markedly thickened with prominent perivesicular fat stranding and mild mucosal hyperemia. Stomach/Bowel: Enteric sutures in the colon. Colonic tortuosity. Moderate colonic stool burden. Scattered colonic diverticula without diverticulitis. No colonic inflammatory change. There is no small bowel obstruction or inflammation. Unremarkable appearance of the stomach. The appendix is not definitively seen. Vascular/Lymphatic: Aortic atherosclerosis. No aneurysm. No acute vascular findings. No abdominopelvic adenopathy. Reproductive: Hysterectomy. There is an elongated tubular structure in the right adnexa spanning 5.6 x 1.8 cm, series 2, image 69. This measures simple fluid density. This is been present on prior exams, favor peritoneal inclusion cyst. No specific follow-up recommended. Other: No ascites or free air. Midline abdominal wall laxity with minimal midline ventral hernia containing nonobstructed noninflamed small bowel. Bilateral fat containing inguinal hernias. Musculoskeletal: Mild to moderate lumbar degenerative change. Mild bilateral hip osteoarthritis. Osteopenia/osteoporosis. There are no acute or suspicious osseous abnormalities. IMPRESSION: 1. Markedly  thickened urinary bladder with prominent perivesicular fat stranding and mild mucosal hyperemia, consistent with cystitis. Recommend correlation with urinalysis. 2. Colonic diverticulosis without diverticulitis. 3. Midline abdominal wall laxity with minimal midline ventral hernia containing nonobstructed noninflamed small bowel. Aortic Atherosclerosis (ICD10-I70.0). Electronically Signed   By: Narda Rutherford M.D.   On: 09/16/2023 19:54    Procedures Procedures    Medications Ordered in ED Medications  iohexol (OMNIPAQUE) 300 MG/ML solution 80 mL (80 mLs Intravenous Contrast Given 09/16/23 1851)  cefTRIAXone (ROCEPHIN) 2 g in sodium chloride 0.9 % 100 mL IVPB (0 g Intravenous Stopped 09/16/23 2107)    ED Course/ Medical Decision Making/ A&P  Medical Decision Making Amount and/or Complexity of Data Reviewed Labs: ordered. Radiology: ordered.  Risk Prescription drug management.   Patient with urinary tract infection.  She is given Rocephin and prescription of antibiotics and will follow-up with her PCP next week   Final Clinical Impression(s) / ED Diagnoses Final diagnoses:  Acute cystitis with hematuria    Rx / DC Orders ED Discharge Orders          Ordered    ciprofloxacin (CIPRO) 500 MG tablet  2 times daily        09/16/23 2219              Bethann Berkshire, MD 09/17/23 1136

## 2023-09-21 LAB — URINE CULTURE: Culture: >=100000 — AB

## 2023-09-22 ENCOUNTER — Telehealth (HOSPITAL_BASED_OUTPATIENT_CLINIC_OR_DEPARTMENT_OTHER): Payer: Self-pay

## 2023-09-22 NOTE — Progress Notes (Signed)
ED Antimicrobial Stewardship Positive Culture Follow Up   [name] is an 82 y.o. adult who presented to [facility] on 09/16/2023 with a chief complaint of  Chief Complaint  Patient presents with   Abdominal Pain    Recent Results (from the past 720 hour(s))  Urine Culture     Status: Abnormal   Collection Time: 09/16/23  8:31 PM   Specimen: Urine, Catheterized  Result Value Ref Range Status   Specimen Description   Final    URINE, CATHETERIZED Performed at [facility], [address], [place], [place] [address]    Special Requests   Final    NONE Performed at [facility], [address], [place], [place] [address]    Culture (A)  Final    >=100,000 COLONIES/mL ENTEROCOCCUS FAECALIS >=100,000 COLONIES/mL PROTEUS MIRABILIS Susceptibility Pattern Suggests Possibility of an Extended Spectrum Beta Lactamase Producer. Contact Laboratory Within 7 Days if Confirmation Warranted. VANCOMYCIN RESISTANT ENTEROCOCCUS ISOLATED    Report Status 09/21/2023 FINAL  Final   Organism ID, Bacteria PROTEUS MIRABILIS (A)  Final   Organism ID, Bacteria ENTEROCOCCUS FAECALIS (A)  Final      Susceptibility   Enterococcus faecalis - MIC*    AMPICILLIN <=2 SENSITIVE Sensitive     NITROFURANTOIN <=16 SENSITIVE Sensitive     VANCOMYCIN >=32 RESISTANT Resistant     LINEZOLID 1 SENSITIVE Sensitive     * >=100,000 COLONIES/mL ENTEROCOCCUS FAECALIS   Proteus mirabilis - MIC*    AMPICILLIN >=32 RESISTANT Resistant     CEFAZOLIN >=64 RESISTANT Resistant     CEFEPIME 2 SENSITIVE Sensitive     CEFTRIAXONE 8 RESISTANT Resistant     CIPROFLOXACIN >=4 RESISTANT Resistant     GENTAMICIN <=1 SENSITIVE Sensitive     IMIPENEM 8 INTERMEDIATE Intermediate     NITROFURANTOIN 128 RESISTANT Resistant     TRIMETH/SULFA >=320 RESISTANT Resistant     AMPICILLIN/SULBACTAM >=32 RESISTANT Resistant     PIP/TAZO 8 SENSITIVE Sensitive ug/mL    * >=100,000 COLONIES/mL PROTEUS MIRABILIS   Presented with abdominal  pain/constipation. UA positive but of note, had 21-50 squamous cells. Does have hx of proteus along with E Coli, E faecalis, and pseudomonas. CT consistent with cystitis.   [x]  Treated with ciprofloxacin, organism resistant to prescribed antimicrobial []  Patient discharged originally without antimicrobial agent and treatment is now indicated  New antibiotic prescription: Stop ciprofloxacin and start fosfomycin 3g once. If symptoms still continue after dose would advise patient to seek medical attention for re-evaluation.   ED Provider: Langston Masker, PA   Thank you for allowing pharmacy to participate in this patient's care,  Sherron Monday, PharmD, BCCCP Clinical Pharmacist  Phone: [phone] 09/22/2023 9:10 AM  Monday - Friday phone -  [phone] Saturday - Sunday phone - [phone]

## 2023-09-22 NOTE — Telephone Encounter (Signed)
Post ED Visit - Positive Culture Follow-up: Successful Patient Follow-Up  Culture assessed and recommendations reviewed by:  []  Enzo Bi, Pharm.D. []  Celedonio Miyamoto, Pharm.D., BCPS AQ-ID []  Garvin Fila, Pharm.D., BCPS []  Georgina Pillion, Pharm.D., BCPS []  [place], [address].D., BCPS, AAHIVP []  Estella Husk, Pharm.D., BCPS, AAHIVP []  Lysle Pearl, PharmD, BCPS []  Phillips Climes, PharmD, BCPS [x]  Malva Cogan, PharmD, BCCCPS []  Verlan Friends, PharmD  Positive urine culture  []  Patient discharged without antimicrobial prescription and treatment is now indicated [x]  Organism is resistant to prescribed ED discharge antimicrobial []  Patient with positive blood cultures  Plan d/c cipro and take Fosfomycin  Changes discussed with ED provider: Langston Masker, PA-C New antibiotic prescription Fosfomycin 3 g po x 1 dose. Called and faxed to Pt nurse at [facility] center for nursing   Contacted patient nurse, date 09/22/2023, time 10:45 am   Sandria Senter 09/22/2023, 10:47 AM

## 2023-12-25 ENCOUNTER — Emergency Department (HOSPITAL_COMMUNITY): Payer: Medicare (Managed Care)

## 2023-12-25 ENCOUNTER — Inpatient Hospital Stay (HOSPITAL_COMMUNITY)
Admission: EM | Admit: 2023-12-25 | Discharge: 2023-12-28 | DRG: 562 | Disposition: A | Discharge status: Skilled Nursing Facility | Payer: Medicare (Managed Care) | Source: Skilled Nursing Facility | Attending: Internal Medicine | Admitting: Internal Medicine

## 2023-12-25 ENCOUNTER — Other Ambulatory Visit: Payer: Self-pay

## 2023-12-25 DIAGNOSIS — J449 Chronic obstructive pulmonary disease, unspecified: Secondary | ICD-10-CM | POA: Diagnosis present

## 2023-12-25 DIAGNOSIS — Z800 Family history of malignant neoplasm of digestive organs: Secondary | ICD-10-CM

## 2023-12-25 DIAGNOSIS — F419 Anxiety disorder, unspecified: Secondary | ICD-10-CM | POA: Diagnosis present

## 2023-12-25 DIAGNOSIS — Z79899 Other long term (current) drug therapy: Secondary | ICD-10-CM | POA: Diagnosis not present

## 2023-12-25 DIAGNOSIS — S82121A Displaced fracture of lateral condyle of right tibia, initial encounter for closed fracture: Principal | ICD-10-CM | POA: Diagnosis present

## 2023-12-25 DIAGNOSIS — Z8659 Personal history of other mental and behavioral disorders: Secondary | ICD-10-CM

## 2023-12-25 DIAGNOSIS — Z7401 Bed confinement status: Secondary | ICD-10-CM

## 2023-12-25 DIAGNOSIS — I129 Hypertensive chronic kidney disease with stage 1 through stage 4 chronic kidney disease, or unspecified chronic kidney disease: Secondary | ICD-10-CM | POA: Diagnosis present

## 2023-12-25 DIAGNOSIS — W010XXA Fall on same level from slipping, tripping and stumbling without subsequent striking against object, initial encounter: Secondary | ICD-10-CM | POA: Diagnosis present

## 2023-12-25 DIAGNOSIS — Z885 Allergy status to narcotic agent status: Secondary | ICD-10-CM

## 2023-12-25 DIAGNOSIS — I10 Essential (primary) hypertension: Secondary | ICD-10-CM | POA: Diagnosis present

## 2023-12-25 DIAGNOSIS — Z433 Encounter for attention to colostomy: Secondary | ICD-10-CM | POA: Diagnosis not present

## 2023-12-25 DIAGNOSIS — N183 Chronic kidney disease, stage 3 unspecified: Secondary | ICD-10-CM | POA: Diagnosis present

## 2023-12-25 DIAGNOSIS — N1832 Chronic kidney disease, stage 3b: Secondary | ICD-10-CM | POA: Diagnosis not present

## 2023-12-25 DIAGNOSIS — F03918 Unspecified dementia, unspecified severity, with other behavioral disturbance: Secondary | ICD-10-CM | POA: Diagnosis present

## 2023-12-25 DIAGNOSIS — Z8673 Personal history of transient ischemic attack (TIA), and cerebral infarction without residual deficits: Secondary | ICD-10-CM | POA: Diagnosis not present

## 2023-12-25 DIAGNOSIS — G9341 Metabolic encephalopathy: Secondary | ICD-10-CM | POA: Diagnosis present

## 2023-12-25 DIAGNOSIS — B20 Human immunodeficiency virus [HIV] disease: Secondary | ICD-10-CM | POA: Diagnosis present

## 2023-12-25 DIAGNOSIS — Z1612 Extended spectrum beta lactamase (ESBL) resistance: Secondary | ICD-10-CM | POA: Diagnosis present

## 2023-12-25 DIAGNOSIS — N3000 Acute cystitis without hematuria: Secondary | ICD-10-CM | POA: Diagnosis not present

## 2023-12-25 DIAGNOSIS — B964 Proteus (mirabilis) (morganii) as the cause of diseases classified elsewhere: Secondary | ICD-10-CM | POA: Diagnosis present

## 2023-12-25 DIAGNOSIS — F209 Schizophrenia, unspecified: Secondary | ICD-10-CM | POA: Diagnosis present

## 2023-12-25 DIAGNOSIS — Y92129 Unspecified place in nursing home as the place of occurrence of the external cause: Secondary | ICD-10-CM

## 2023-12-25 DIAGNOSIS — Z888 Allergy status to other drugs, medicaments and biological substances status: Secondary | ICD-10-CM | POA: Diagnosis not present

## 2023-12-25 DIAGNOSIS — Z1624 Resistance to multiple antibiotics: Secondary | ICD-10-CM | POA: Diagnosis present

## 2023-12-25 DIAGNOSIS — Z833 Family history of diabetes mellitus: Secondary | ICD-10-CM | POA: Diagnosis not present

## 2023-12-25 DIAGNOSIS — S82141A Displaced bicondylar fracture of right tibia, initial encounter for closed fracture: Principal | ICD-10-CM | POA: Diagnosis present

## 2023-12-25 DIAGNOSIS — Z8674 Personal history of sudden cardiac arrest: Secondary | ICD-10-CM

## 2023-12-25 DIAGNOSIS — R509 Fever, unspecified: Secondary | ICD-10-CM

## 2023-12-25 DIAGNOSIS — A498 Other bacterial infections of unspecified site: Secondary | ICD-10-CM | POA: Diagnosis not present

## 2023-12-25 DIAGNOSIS — Z882 Allergy status to sulfonamides status: Secondary | ICD-10-CM | POA: Diagnosis not present

## 2023-12-25 DIAGNOSIS — Z1152 Encounter for screening for COVID-19: Secondary | ICD-10-CM

## 2023-12-25 DIAGNOSIS — N390 Urinary tract infection, site not specified: Secondary | ICD-10-CM

## 2023-12-25 DIAGNOSIS — Z91030 Bee allergy status: Secondary | ICD-10-CM

## 2023-12-25 DIAGNOSIS — Z66 Do not resuscitate: Secondary | ICD-10-CM | POA: Diagnosis not present

## 2023-12-25 HISTORY — DX: Other psychoactive substance use, unspecified with psychoactive substance-induced psychotic disorder with hallucinations: F19.951

## 2023-12-25 LAB — COMPREHENSIVE METABOLIC PANEL
ALT: 17 U/L (ref 0–44)
AST: 26 U/L (ref 15–41)
Albumin: 3.9 g/dL (ref 3.5–5.0)
Alkaline Phosphatase: 96 U/L (ref 38–126)
Anion gap: 12 (ref 5–15)
BUN: 23 mg/dL (ref 8–23)
CO2: 22 mmol/L (ref 22–32)
Calcium: 10.3 mg/dL (ref 8.9–10.3)
Chloride: 105 mmol/L (ref 98–111)
Creatinine, Ser: 1.4 mg/dL — ABNORMAL HIGH (ref 0.44–1.00)
GFR, Estimated: 38 mL/min — ABNORMAL LOW (ref >60)
Glucose, Bld: 108 mg/dL — ABNORMAL HIGH (ref 70–99)
Potassium: 3.6 mmol/L (ref 3.5–5.1)
Sodium: 139 mmol/L (ref 135–145)
Total Bilirubin: 1.4 mg/dL — ABNORMAL HIGH (ref 0.0–1.2)
Total Protein: 7.2 g/dL (ref 6.5–8.1)

## 2023-12-25 LAB — URINALYSIS, ROUTINE W REFLEX MICROSCOPIC
Bilirubin Urine: NEGATIVE
Glucose, UA: NEGATIVE mg/dL
Hgb urine dipstick: NEGATIVE
Ketones, ur: NEGATIVE mg/dL
Nitrite: NEGATIVE
Protein, ur: >=300 mg/dL — AB
Specific Gravity, Urine: 1.022 (ref 1.005–1.030)
WBC, UA: >50 WBC/hpf (ref 0–5)
pH: 8 (ref 5.0–8.0)

## 2023-12-25 LAB — CBC WITH DIFFERENTIAL/PLATELET
Abs Immature Granulocytes: 0.02 10*3/uL (ref 0.00–0.07)
Basophils Absolute: 0 10*3/uL (ref 0.0–0.1)
Basophils Relative: 0 %
Eosinophils Absolute: 0.2 10*3/uL (ref 0.0–0.5)
Eosinophils Relative: 2 %
HCT: 36.2 % (ref 36.0–46.0)
Hemoglobin: 12.4 g/dL (ref 12.0–15.0)
Immature Granulocytes: 0 %
Lymphocytes Relative: 25 %
Lymphs Abs: 2.2 10*3/uL (ref 0.7–4.0)
MCH: 32 pg (ref 26.0–34.0)
MCHC: 34.3 g/dL (ref 30.0–36.0)
MCV: 93.5 fL (ref 80.0–100.0)
Monocytes Absolute: 0.9 10*3/uL (ref 0.1–1.0)
Monocytes Relative: 10 %
Neutro Abs: 5.4 10*3/uL (ref 1.7–7.7)
Neutrophils Relative %: 63 %
Platelets: 173 10*3/uL (ref 150–400)
RBC: 3.87 MIL/uL (ref 3.87–5.11)
RDW: 12.9 % (ref 11.5–15.5)
WBC: 8.7 10*3/uL (ref 4.0–10.5)
nRBC: 0 % (ref 0.0–0.2)

## 2023-12-25 LAB — LACTIC ACID, PLASMA: Lactic Acid, Venous: 0.7 mmol/L (ref 0.5–1.9)

## 2023-12-25 LAB — RESP PANEL BY RT-PCR (RSV, FLU A&B, COVID)  RVPGX2
Influenza A by PCR: NEGATIVE
Influenza B by PCR: NEGATIVE
Resp Syncytial Virus by PCR: NEGATIVE
SARS Coronavirus 2 by RT PCR: NEGATIVE

## 2023-12-25 LAB — C DIFFICILE QUICK SCREEN W PCR REFLEX
C Diff antigen: NEGATIVE
C Diff interpretation: NOT DETECTED
C Diff toxin: NEGATIVE

## 2023-12-25 MED ORDER — PIPERACILLIN-TAZOBACTAM 3.375 G IVPB
3.3750 g | Freq: Three times a day (TID) | INTRAVENOUS | Status: DC
  Administered 2023-12-26 – 2023-12-28 (×7): 3.375 g via INTRAVENOUS
  Filled 2023-12-25 (×7): qty 50

## 2023-12-25 MED ORDER — SODIUM CHLORIDE 0.9 % IV SOLN
1.0000 g | Freq: Once | INTRAVENOUS | Status: AC
  Administered 2023-12-25: 1 g via INTRAVENOUS
  Filled 2023-12-25: qty 10

## 2023-12-25 MED ORDER — LACTATED RINGERS IV SOLN: INTRAVENOUS | Status: AC

## 2023-12-25 MED ORDER — ACETAMINOPHEN 500 MG PO TABS
1000.0000 mg | ORAL_TABLET | Freq: Once | ORAL | Status: AC
  Administered 2023-12-25: 1000 mg via ORAL
  Filled 2023-12-25: qty 2

## 2023-12-25 MED ORDER — PIPERACILLIN-TAZOBACTAM 3.375 G IVPB 30 MIN
3.3750 g | Freq: Once | INTRAVENOUS | Status: AC
  Administered 2023-12-25: 3.375 g via INTRAVENOUS
  Filled 2023-12-25: qty 50

## 2023-12-25 NOTE — ED Notes (Addendum)
 Pt had two episodes of incontinence, one type 6 and the second completely type 7 stool, pt cleaned up, full bed change, placed into clean brief and gown, stool sample collected

## 2023-12-25 NOTE — Progress Notes (Signed)
 Pharmacy Antibiotic Note  [name] is a 83 y.o. adult admitted on 12/25/2023 with fall with concerns for UTI.  Pharmacy has been consulted for Zosyn dosing.  -11/24 Ucx: Proteus Mirabilis + E. Faecalis  -UA: Nitrite neg, mod luekocytes, WBC >50 -WBC 8.7, sCr 1.4, Tmax 100.6  Plan: -Zosyn 3.375gm IV every 8 hours -Monitor renal function -Follow up signs of clinical improvement, LOT, de-escalation of antibiotics   Height: 5\' 7"  (170.2 cm) Weight: 59 kg (130 lb 1.1 oz) IBW/kg (Calculated) : 61.6  Temp (24hrs), Avg:99.5 F (37.5 C), Min:98.4 F (36.9 C), Max:100.6 F (38.1 C)  Recent Labs  Lab 12/25/23 1630 12/25/23 2002  WBC 8.7  --   CREATININE 1.40*  --   LATICACIDVEN  --  0.7    Estimated Creatinine Clearance (by C-G formula based on SCr of 1.4 mg/dL (H)) Female: 78.2 mL/min (A) Female: 33.9 mL/min (A)     Antimicrobials this admission: Zosyn 3/1 >>   Microbiology results: 3/1 BCx:   Thank you for allowing pharmacy to be a part of this patient's care.  Arabella Merles, PharmD. Clinical Pharmacist 12/25/2023 10:57 PM

## 2023-12-25 NOTE — Assessment & Plan Note (Signed)
 On admission. Creatinine 1.4.  Stable.  12-26-2023 stable. Chronic.  12-27-2023 start IVF today for poor po intake.  12-28-2023 urine in purewick container much lighter in color today after IVF overnight. Scr 1.55 today. BUN 16. Will stop ARB at discharge due to poor po intake.

## 2023-12-25 NOTE — ED Notes (Signed)
 R knee swollen, painful with light palpitation, R foot externally rotated

## 2023-12-25 NOTE — Assessment & Plan Note (Addendum)
 On admission. As seen on CT of the right knee.  Status post fall.  Patient is nonambulatory at baseline.  Nursing home resident. Head and cervical CT including imaging of right foot, ankle, pelvis, thoracic and lumbar spine all negative for acute abnormality. - EDP to orthopedist Dr. Ave Filter, recommended conservative management-no weightbearing, and knee immobilizer  -Hydrocodone as needed  12-26-2023 continue with knee immobilizer. Outpatient referral to Dr. Ave Filter at discharge.Marland Kitchen   12-27-2023 stable. Non-ambulatory at baseline. Continue knee immobilizer.  12-28-2023 ready for DC. Continue NWB for 4 weeks. F/u with Dr. Ave Filter at discharge.

## 2023-12-25 NOTE — ED Notes (Signed)
 Pt had back to back Bms that appeared to have chunks in it and the consistency of stool was liquid

## 2023-12-25 NOTE — Assessment & Plan Note (Addendum)
 Stable. -Resume venlafaxine, donepezil

## 2023-12-25 NOTE — ED Notes (Signed)
 Pt returns from imaging with large amount of liquid stool in her brief. Pt cleaned and had another bowel movement (liquid stool) while we were cleaning her.

## 2023-12-25 NOTE — Assessment & Plan Note (Signed)
 Stable. -Resume losartan,

## 2023-12-25 NOTE — Assessment & Plan Note (Addendum)
 Presenting with fever of 100.6, diarrheal episodes in the ED.  Rules out for sepsis.  WBC 8.7.  UA suggestive of UTI with moderate leukocytes and many bacteria.  Stool C. difficile negative.  Chest x-ray without acute abnormality. -Recent urine cultures growing multidrug-resistant Proteus, Enterococcus. -Consulted pharm for assistance with choice of antibiotics-Zosyn only is okay.   -Check COVID, influenza and RSV-negative - Add-on urine cultures -Appears to be dehydrated, continue LR at 75 cc/h for 1 day

## 2023-12-25 NOTE — Assessment & Plan Note (Signed)
 On admission. 05/2023- Last HIV viral load not detected.  CD4 helper cells 506. -Resume Biktarvy  12-26-2023 follows with outpatient ID clinic. 12-27-2023 stable. Chronic.  12-28-2023 continue Biktarvy. F/u with ID clinic as scheduled.

## 2023-12-25 NOTE — ED Triage Notes (Signed)
 Pt BIB [facility] from [facility] after falling last night and injuring her right leg. Pt complaining of right knee, ankle and foot pain. Knee is obviously swollen. Pt stated that she also thinks she hit her head when she fell

## 2023-12-25 NOTE — ED Provider Notes (Signed)
 [place] EMERGENCY DEPARTMENT AT [facility] Provider Note   CSN: [id number] Arrival date & time: 12/25/23  1325     History  Chief Complaint  Patient presents with   Leg Injury    [name] is a 83 y.o. adult.  Patient is an 83 year old female who presents to the emergency department secondary to a fall which occurred at her long-term care facility last night.  Patient notes that she tripped causing her to fall on her right lower extremity.  She is currently complaining of pain to the right foot, ankle, knee.  She is unsure if she hit her head when she fell.  She denies any pain to her upper extremities or left lower extremity.  She denies any chest pain or abdominal pain.  She does admit to some mild pain to her neck.  She denies any associated vision or hearing changes.        Home Medications Prior to Admission medications   Medication Sig Start Date End Date Taking? Authorizing Provider  ALLERGY RELIEF 10 MG tablet Take 10 mg by mouth daily. 05/02/21   [provider]  ARIPiprazole (ABILIFY) 5 MG tablet Take 5 mg by mouth daily. 10/17/21   [provider]  atorvastatin (LIPITOR) 20 MG tablet Take 20 mg by mouth daily.    [provider]  bictegravir-emtricitabine-tenofovir AF (BIKTARVY) 50-200-25 MG TABS tablet Take 1 tablet by mouth daily. Please discontinue TRIUMEQ and TIVICAY 05/25/22   Daiva Eves, Lisette Grinder, MD  cephALEXin (KEFLEX) 250 MG capsule Take 1 capsule (250 mg total) by mouth at bedtime. 08/09/23   McKenzie, Mardene Celeste, MD  ciprofloxacin (CIPRO) 500 MG tablet Take 1 tablet (500 mg total) by mouth 2 (two) times daily. One po bid x 7 days 09/16/23   Bethann Berkshire, MD  clonazePAM (KLONOPIN) 0.5 MG tablet Take 1 tablet (0.5 mg total) by mouth 2 (two) times daily. 11/20/22   Sherryll Burger, Pratik D, DO  Cranberry 250 MG TABS Take 450 mg by mouth every morning.    [provider]  docusate sodium (COLACE) 100 MG capsule Take 1  capsule (100 mg total) by mouth 2 (two) times daily. 11/21/18   Lucretia Roers, MD  donepezil (ARICEPT) 5 MG tablet  05/05/23   [provider]  DULoxetine (CYMBALTA) 60 MG capsule Take 60 mg by mouth daily. 02/28/21   [provider]  feeding supplement (ENSURE ENLIVE / ENSURE PLUS) LIQD Take 237 mLs by mouth 2 (two) times daily between meals. 01/09/22   Dahal, Melina Schools, MD  fluticasone (FLONASE) 50 MCG/ACT nasal spray 2 SPRAYS INTO BOTH NOSTRILS AT BEDTIME. Patient taking differently: Place 2 sprays into both nostrils at bedtime. 05/22/19   Randall Hiss, MD  furosemide (LASIX) 40 MG tablet Take 1 tablet (40 mg total) by mouth daily as needed for fluid. Hold until follow up with PCP Patient taking differently: Take 40 mg by mouth daily as needed for fluid. 12/31/19   Vassie Loll, MD  losartan (COZAAR) 25 MG tablet Take 12.5 mg by mouth daily. 05/02/21   [provider]  Magnesium 100 MG TABS Take 400 mg by mouth daily.    [provider]  Menthol-Zinc Oxide 0.44-20.6 % OINT Apply 1 application. topically every 8 (eight) hours as needed (protection). Apply to buttocks    [provider]  mirabegron ER (MYRBETRIQ) 50 MG TB24 tablet Take 1 tablet (50 mg total) by mouth daily. 02/10/23   Wilkie Aye  L, MD  mirtazapine (REMERON) 7.5 MG tablet Take 7.5 mg by mouth at bedtime. 08/03/23   [provider]  Multiple Vitamin (ONE-A-DAY 55 PLUS PO) Take 1 tablet by mouth daily.    [provider]  nitrofurantoin, macrocrystal-monohydrate, (MACROBID) 100 MG capsule Take 1 capsule (100 mg total) by mouth every 12 (twelve) hours. 02/10/23   McKenzie, Mardene Celeste, MD  oxymetazoline (AFRIN) 0.05 % nasal spray Place 1 spray into both nostrils every 12 (twelve) hours as needed for congestion (Nose Bleeds).    [provider]  pantoprazole (PROTONIX) 20 MG tablet Take 20 mg by mouth daily. 08/31/23   [provider]  polyethylene  glycol (MIRALAX) 17 g packet Take 17 g by mouth daily. Hold for diarrhea 06/26/21   Vassie Loll, MD  polyvinyl alcohol (LIQUIFILM TEARS) 1.4 % ophthalmic solution Place 1 drop into both eyes daily.    [provider]  senna (SENOKOT) 8.6 MG tablet Take 1 tablet by mouth 2 (two) times daily.    [provider]  thiamine (VITAMIN B-1) 100 MG tablet Take 100 mg by mouth daily.    [provider]  Venlafaxine HCl 75 MG TB24 Take 1 tablet by mouth daily. 12/02/22   [provider]      Allergies    Bee venom, Promethazine, Tenofovir disoproxil fumarate [tenofovir disoproxil fumarate], Compazine  [prochlorperazine edisylate], Haloperidol lactate, Sulfa antibiotics, Brompheniramine-acetaminophen, Chlorcyclizine, Chlorpromazine, Codeine, Navane [thiothixene (tiotixene)], Prednisone, Promethazine hcl, Propoxyphene n-acetaminophen, and Morphine    Review of Systems   Review of Systems  Musculoskeletal:        Pain to right knee, ankle, foot, neck  All other systems reviewed and are negative.   Physical Exam Updated Vital Signs BP (!) 153/73   Pulse 75   Temp 98.4 F (36.9 C) (Oral)   Resp 13   Ht 5\' 7"  (1.702 m)   Wt 59 kg   SpO2 98%   BMI 20.37 kg/m  Physical Exam Vitals and nursing note reviewed.  Constitutional:      Appearance: Normal appearance.  HENT:     Head: Normocephalic and atraumatic.     Nose: Nose normal.     Mouth/Throat:     Mouth: Mucous membranes are moist.  Eyes:     Extraocular Movements: Extraocular movements intact.     Conjunctiva/sclera: Conjunctivae normal.     Pupils: Pupils are equal, round, and reactive to light.  Neck:     Comments: Mild midline tenderness, no step-off or deformity Cardiovascular:     Rate and Rhythm: Normal rate and regular rhythm.     Pulses: Normal pulses.     Heart sounds: Normal heart sounds. No murmur heard.    No gallop.  Pulmonary:     Effort: Pulmonary effort is normal. No respiratory  distress.     Breath sounds: Normal breath sounds. No wheezing or rales.  Abdominal:     General: Abdomen is flat. Bowel sounds are normal. There is no distension.     Palpations: Abdomen is soft.     Tenderness: There is no abdominal tenderness. There is no guarding.  Musculoskeletal:        General: Normal range of motion.     Cervical back: Normal range of motion and neck supple.     Comments: Tenderness palpation noted over right knee, ankle, foot, nontender palpation of her right hip, nontender palpation of her left lower extremity diffusely, DP and PT pulses are 2+ in bilateral lower  extremities, sensation is intact distally, moderate edema noted to right knee, limited active and passive range of motion at right knee secondary to pain and swelling  Nontender palpation over bilateral upper extremities throughout, radial pulse 2+ upper extremities, sensation intact distally, full range of motion noted throughout, no obvious deformity or bruising, no skin breakdown ulceration, no lacerations or abrasions  Skin:    General: Skin is warm and dry.  Neurological:     General: No focal deficit present.     Mental Status: She is alert and oriented to person, place, and time. Mental status is at baseline.  Psychiatric:        Mood and Affect: Mood normal.        Behavior: Behavior normal.        Thought Content: Thought content normal.        Judgment: Judgment normal.     ED Results / Procedures / Treatments   Labs (all labs ordered are listed, but only abnormal results are displayed) Labs Reviewed - No data to display  EKG None  Radiology CT Head Wo Contrast Result Date: 12/25/2023 CLINICAL DATA:  Fall last night EXAM: CT HEAD WITHOUT CONTRAST CT CERVICAL SPINE WITHOUT CONTRAST TECHNIQUE: Multidetector CT imaging of the head and cervical spine was performed following the standard protocol without intravenous contrast. Multiplanar CT image reconstructions of the cervical spine were  also generated. RADIATION DOSE REDUCTION: This exam was performed according to the departmental dose-optimization program which includes automated exposure control, adjustment of the mA and/or kV according to patient size and/or use of iterative reconstruction technique. COMPARISON:  12/01/2021 FINDINGS: CT HEAD FINDINGS Brain: No evidence of acute infarction, hemorrhage, hydrocephalus, extra-axial collection or mass lesion/mass effect. Mild periventricular white matter hypodensity. Nonacute lacunar infarction of the right basal ganglia. Vascular: No hyperdense vessel or unexpected calcification. Skull: Normal. Negative for fracture or focal lesion. Sinuses/Orbits: No acute finding. Other: None. CT CERVICAL SPINE FINDINGS Alignment: Degenerative straightening of the normal cervical lordosis. Skull base and vertebrae: No acute fracture. No primary bone lesion or focal pathologic process. Soft tissues and spinal canal: No prevertebral fluid or swelling. No visible canal hematoma. Disc levels: Moderate disc space height loss and osteophytosis, worst from C5-C7 Upper chest: Negative. Other: None. IMPRESSION: 1. No acute intracranial pathology. Small-vessel white matter disease and nonacute lacunar infarction of the right basal ganglia. 2. No fracture or static subluxation of the cervical spine. 3. Moderate cervical disc degenerative disease. Electronically Signed   By: Jearld Lesch M.D.   On: 12/25/2023 15:29   CT Cervical Spine Wo Contrast Result Date: 12/25/2023 CLINICAL DATA:  Fall last night EXAM: CT HEAD WITHOUT CONTRAST CT CERVICAL SPINE WITHOUT CONTRAST TECHNIQUE: Multidetector CT imaging of the head and cervical spine was performed following the standard protocol without intravenous contrast. Multiplanar CT image reconstructions of the cervical spine were also generated. RADIATION DOSE REDUCTION: This exam was performed according to the departmental dose-optimization program which includes automated exposure  control, adjustment of the mA and/or kV according to patient size and/or use of iterative reconstruction technique. COMPARISON:  12/01/2021 FINDINGS: CT HEAD FINDINGS Brain: No evidence of acute infarction, hemorrhage, hydrocephalus, extra-axial collection or mass lesion/mass effect. Mild periventricular white matter hypodensity. Nonacute lacunar infarction of the right basal ganglia. Vascular: No hyperdense vessel or unexpected calcification. Skull: Normal. Negative for fracture or focal lesion. Sinuses/Orbits: No acute finding. Other: None. CT CERVICAL SPINE FINDINGS Alignment: Degenerative straightening of the normal cervical lordosis. Skull base and vertebrae: No acute  fracture. No primary bone lesion or focal pathologic process. Soft tissues and spinal canal: No prevertebral fluid or swelling. No visible canal hematoma. Disc levels: Moderate disc space height loss and osteophytosis, worst from C5-C7 Upper chest: Negative. Other: None. IMPRESSION: 1. No acute intracranial pathology. Small-vessel white matter disease and nonacute lacunar infarction of the right basal ganglia. 2. No fracture or static subluxation of the cervical spine. 3. Moderate cervical disc degenerative disease. Electronically Signed   By: Jearld Lesch M.D.   On: 12/25/2023 15:29    Procedures Procedures    Medications Ordered in ED Medications - No data to display  ED Course/ Medical Decision Making/ A&P                                 Medical Decision Making Amount and/or Complexity of Data Reviewed Labs: ordered. Radiology: ordered.  Risk OTC drugs. Prescription drug management. Decision regarding hospitalization.   This patient presents to the ED for concern of fall, right lower extremity pain, this involves an extensive number of treatment options, and is a complaint that carries with it a high risk of complications and morbidity.  The differential diagnosis includes long bone fracture, joint fracture, hematoma,  contusion, vertebral fracture, intracranial hemorrhage, sepsis   Co morbidities that complicate the patient evaluation  Bedbound   Additional history obtained:  Additional history obtained from family External records from outside source obtained and reviewed including medical records   Lab Tests:  I Ordered, and personally interpreted labs.  The pertinent results include: Urinalysis consistent with urinary tract infection, creatinine at baseline, no leukocytosis, negative C. difficile   Imaging Studies ordered:  I ordered imaging studies including CT scan of right knee, hip, cervical spine, head, x-rays of lumbar and thoracic spine, ankle, foot I independently visualized and interpreted imaging which showed no acute intracranial process, no vertebral fracture, right tibial plateau fracture I agree with the radiologist interpretation    Consultations Obtained:  I requested consultation with the orthopedics,  and discussed lab and imaging findings as well as pertinent plan - they recommend: Right knee immobilizer, non weightbearing   Problem List / ED Course / Critical interventions / Medication management  Patient does remain stable at this time.  Will plan for admission to hospital services patient has spiked a fever and does have an apparent urinary tract infection.  Did touch base with Dr. Ave Filter with orthopedics who does note that the patient can be treated with a knee immobilizer and remain nonweightbearing in the right lower extremity for her tibial plateau fracture.  Patient has no other fractures noted on imaging and no intracranial hemorrhage.  Blood work was unremarkable.  Patient has been given dose of Rocephin and started on IV fluids.  Did discuss patient case with Dr. Wendall Stade who has excepted for admission I ordered medication including Ceftin, IV fluids, Tylenol for urinary tract infection Reevaluation of the patient after these medicines showed that the  patient improved I have reviewed the patients home medicines and have made adjustments as needed   Social Determinants of Health:  Bedbound in long-term care facility  Test / Admission - Considered:  Admission        Final Clinical Impression(s) / ED Diagnoses Final diagnoses:  None    Rx / DC Orders ED Discharge Orders     None         Lelon Perla, PA-C 12/25/23 2106  Gloris Manchester, MD 12/25/23 [phone]

## 2023-12-25 NOTE — H&P (Signed)
 History and Physical    [name]:[id number] DOB: [birth year] DOA: 12/25/2023  PCP: Charlynne Pander, MD   Patient coming from: [facility]  I have personally briefly reviewed patient's old medical records in [facility]  Chief Complaint: Fall  HPI: [name] is a 83 y.o. adult with medical history significant for COPD, dementia, depression, hypertension, schizophrenia, HIV, ischemic colitis, large bowel perforation colostomy status. Patient was brought to the ED from nursing home reports of a fall with subsequent swelling to the right knee, and pain to the right leg.  Per EDP, at baseline patient is nonambulatory.  On my evaluation, patient is sleeping, she is arousable, answers a few questions, knows she is in the hospital, answers a few questions but quickly drifts back to sleep.  ED Course: Febrile to 100.6.  Heart rate 70s.  Respiratory rate 13-20.  Blood pressure systolic 110-153.  O2 sats greater than 95% on room air. UA suggestive of UTI.  WBC 8.9. Imaging included right knee CT-findings suspicious for nondisplaced lateral tibial plateau fracture without significant articular depression and a large joint effusion. Right hip x-ray, lumbar and thoracic spine x-ray, right foot and right ankle pain, pelvic x-ray, head and cervical CT-all negative for acute abnormality. EDP talked to orthopedist-conservative management with knee immobilizer. IV ceftriaxone given. Hospitalist to admit for fall, right knee fracture, and UTI.  Review of Systems: As per HPI all other systems reviewed and negative.  Past Medical History:  Diagnosis Date   Anxiety    Arthritis    Bronchitis    Bronchitis    CKD (chronic kidney disease) stage 3, GFR 30-59 ml/min (HCC) 05/04/2016   Complication of anesthesia    pt states she had a cardiac arrest during hysterectomy 1985.  Pt had several surgies since then that were uneventful   Confusion 01/05/2022   COPD (chronic obstructive pulmonary  disease) (HCC)    Dementia (HCC) 01/05/2022   Dementia with behavioral disturbance (HCC) 05/31/2023   Depression    Dizziness 01/05/2022   Dysuria 03/27/2019   Elevated LFTs 05/04/2016   History of TIAs 1981   left side weakness   HIV (human immunodeficiency virus infection) (HCC)    HIV infection with neurological disease (HCC) 05/25/2022   Hypertension    Kidney disease related to HIV infection (HCC)    pt states she has to have her creatinine level checked often    Low blood pressure 01/05/2022   Moderate single current episode of major depressive disorder (HCC) 03/12/2021   Nausea 03/03/2017   Pelvic pain 05/25/2022   Peripheral neuropathy    bilateral feet   Seasonal allergies 03/27/2019   Sinusitis 05/06/2018   UTI (urinary tract infection) 03/03/2017   Weight loss 03/03/2017    Past Surgical History:  Procedure Laterality Date   ABDOMINAL HYSTERECTOMY  1985   BACK SURGERY     BIOPSY  07/15/2020   Procedure: BIOPSY;  Surgeon: Corbin Ade, MD;  [facility]: [facility];  Service: Endoscopy;;   CATARACT EXTRACTION, BILATERAL     CHOLECYSTECTOMY     COLONOSCOPY  08/10/2002   NUR: Normal colonoscopy except for some hemorrhoids and some erythema at the dentate line   COLONOSCOPY  12/10/2006   Edwards:Diffuse colitis in the transverse and descending colon/This is not entirely typical of ischemic colitis or her HIV positive/ disease.  We need to be concerned about other causes   COLONOSCOPY  01/19/2011   RMR: Internal and external hemorrhoids, likely source  of hematochezia anal papilla, otherwise normal rectal mucosa/ Left-sided diverticula.  Cecal polyp with status post cold snare polypectomy.  Remainder of colonic mucosa appeared normal. Pathology with tubular adenoma. Repeat in 2017   COLONOSCOPY WITH PROPOFOL N/A 07/15/2020   Procedure: COLONOSCOPY WITH PROPOFOL;  Surgeon: Corbin Ade, MD;  [facility]: [facility];  Service: Endoscopy;  Laterality: N/A;  8:15AM TCS  via Ostomy   COLOSTOMY N/A 11/12/2018   Procedure: COLOSTOMY;  Surgeon: Lucretia Roers, MD;  Location: [facility];  Service: General;  Laterality: N/A;   COLOSTOMY REVERSAL N/A 10/02/2020   Procedure: COLOSTOMY REVERSAL;  Surgeon: Lucretia Roers, MD;  Location: [facility];  Service: General;  Laterality: N/A;   ESOPHAGOGASTRODUODENOSCOPY  08/10/2002     NUR: Mild changes of reflux esophagitis limited to gastroesophageal junction  No evidence of ring, stricture, or esophageal candidiasis dysphagia/ Gastritis, possibly H. pylori induced   ESOPHAGOGASTRODUODENOSCOPY  12/25/2002   NUR: Erosive antral gastritis.  The degree of gastritis is more significant than her last exam/ Normal examination of the esophagus/ Esophageal dilatation performed by passing 56 and [address] [place]   ESOPHAGOGASTRODUODENOSCOPY  11/13/2010   RMR: Circumferential distal esophageal erosions with soft peptic stricture, consistent with erosive reflux esophagitis or stricture  formation, status post dilation as described above/  Small hiatal hernia/ Tiny antral erosions, otherwise normal stomach D1 and D2   ESOPHAGOGASTRODUODENOSCOPY (EGD) WITH PROPOFOL N/A 07/15/2020   Procedure: ESOPHAGOGASTRODUODENOSCOPY (EGD) WITH PROPOFOL;  Surgeon: Corbin Ade, MD;  [facility];  Service: Endoscopy;  Laterality: N/A;   FLEXIBLE SIGMOIDOSCOPY N/A 07/15/2020   Procedure: FLEXIBLE SIGMOIDOSCOPY;  Surgeon: Corbin Ade, MD;  [facility];  Service: Endoscopy;  Laterality: N/A;   HEMORRHOID SURGERY  09/02/2012   Procedure: HEMORRHOIDECTOMY;  Surgeon: Dalia Heading, MD;  Location: [facility];  Service: General;  Laterality: N/A;   LAPAROTOMY N/A 11/12/2018   Procedure: EXPLORATORY LAPAROTOMY;  Surgeon: Lucretia Roers, MD;  Location: [facility];  Service: General;  Laterality: N/A;   MALONEY DILATION N/A 07/15/2020   Procedure: Alvy Beal;  Surgeon: Corbin Ade, MD;  [facility];  Service: Endoscopy;   Laterality: N/A;   PARTIAL COLECTOMY N/A 11/12/2018   Procedure: PARTIAL COLECTOMY;  Surgeon: Lucretia Roers, MD;  Location: [facility];  Service: General;  Laterality: N/A;   POLYPECTOMY  07/15/2020   Procedure: POLYPECTOMY;  Surgeon: Corbin Ade, MD;  [facility];  Service: Endoscopy;;   TUBAL LIGATION       reports that she has never smoked. She has never used smokeless tobacco. She reports that she does not drink alcohol and does not use drugs.  Allergies  Allergen Reactions   Bee Venom Anaphylaxis   Promethazine Swelling   Tenofovir Disoproxil Fumarate [Tenofovir Disoproxil Fumarate] Other (See Comments)    Renal failure, (08/27/2006)   Compazine  [Prochlorperazine Edisylate] Nausea And Vomiting   Haloperidol Lactate Other (See Comments)    Reaction is muscle tension, causes severe spasms in face and neck. Forces eyes to roll in the back of the head   Sulfa Antibiotics Diarrhea   Brompheniramine-Acetaminophen Other (See Comments)    unknown   Chlorcyclizine Other (See Comments)    Unknown   Chlorpromazine Other (See Comments)    Unknown   Codeine Itching and Nausea And Vomiting   Navane [Thiothixene (Tiotixene)] Other (See Comments) and Nausea And Vomiting    Same muscle spasm reaction as haldol  Prednisone Other (See Comments)    Brief mild psychosis and agitation   Promethazine Hcl Other (See Comments)   Propoxyphene N-Acetaminophen Itching and Nausea And Vomiting   Morphine Itching and Swelling    Family History  Problem Relation Age of Onset   Diabetes Mother    Diabetes Brother    Colon cancer Brother        Diagnosed > age 64    Prior to Admission medications   Medication Sig Start Date End Date Taking? Authorizing Provider  ARIPiprazole (ABILIFY) 5 MG tablet Take 5 mg by mouth daily. 10/17/21  Yes [provider]  atorvastatin (LIPITOR) 20 MG tablet Take 20 mg by mouth daily.   Yes [provider]   bictegravir-emtricitabine-tenofovir AF (BIKTARVY) 50-200-25 MG TABS tablet Take 1 tablet by mouth daily. Please discontinue TRIUMEQ and TIVICAY Patient taking differently: Take 1 tablet by mouth daily. 05/25/22  Yes Daiva Eves, Lisette Grinder, MD  cephALEXin (KEFLEX) 250 MG capsule Take 1 capsule (250 mg total) by mouth at bedtime. 08/09/23  Yes McKenzie, Mardene Celeste, MD  clonazePAM (KLONOPIN) 0.5 MG tablet Take 1 tablet (0.5 mg total) by mouth 2 (two) times daily. 11/20/22  Yes Shah, Pratik D, DO  docusate sodium (COLACE) 100 MG capsule Take 1 capsule (100 mg total) by mouth 2 (two) times daily. 11/21/18  Yes Lucretia Roers, MD  donepezil (ARICEPT) 5 MG tablet Take 5-10 mg by mouth in the morning and at bedtime. 5mg  in the morning and 10mg  at bedtime 05/05/23  Yes [provider]  furosemide (LASIX) 40 MG tablet Take 1 tablet (40 mg total) by mouth daily as needed for fluid. Hold until follow up with PCP Patient taking differently: Take 40 mg by mouth daily. 12/31/19  Yes Vassie Loll, MD  ipratropium-albuterol (DUONEB) 0.5-2.5 (3) MG/3ML SOLN Inhale 3 mLs into the lungs every 6 (six) hours as needed. 12/09/23  Yes [provider]  losartan (COZAAR) 25 MG tablet Take 12.5 mg by mouth daily. 05/02/21  Yes [provider]  Magnesium 400 MG TABS Take 400 mg by mouth daily.   Yes [provider]  mirabegron ER (MYRBETRIQ) 50 MG TB24 tablet Take 1 tablet (50 mg total) by mouth daily. 02/10/23  Yes McKenzie, Mardene Celeste, MD  mirtazapine (REMERON) 7.5 MG tablet Take 15 mg by mouth at bedtime. 08/03/23  Yes [provider]  Multiple Vitamin (ONE-A-DAY 55 PLUS PO) Take 1 tablet by mouth daily.   Yes [provider]  pantoprazole (PROTONIX) 20 MG tablet Take 20 mg by mouth daily. 08/31/23  Yes [provider]  polyethylene glycol (MIRALAX) 17 g packet Take 17 g by mouth daily. Hold for diarrhea 06/26/21  Yes Vassie Loll, MD  polyvinyl alcohol (LIQUIFILM TEARS)  1.4 % ophthalmic solution Place 1 drop into both eyes daily.   Yes [provider]  senna (SENOKOT) 8.6 MG tablet Take 1 tablet by mouth 2 (two) times daily.   Yes [provider]  thiamine (VITAMIN B-1) 100 MG tablet Take 100 mg by mouth daily.   Yes [provider]  Venlafaxine HCl 75 MG TB24 Take 75 mg by mouth daily. 12/02/22  Yes [provider]  ALLERGY RELIEF 10 MG tablet Take 10 mg by mouth daily. 05/02/21   [provider]    Physical Exam: Limited exam, patient is quite sleepy Vitals:   12/25/23 1722 12/25/23 1724 12/25/23 1819 12/25/23 1906  BP:  (!) 122/92  110/84  Pulse:  75    Resp: 20 20  19   Temp:   (!) 100.6 F (38.1 C)   TempSrc:   Rectal   SpO2:  98%    Weight:      Height:        Constitutional: NAD, calm, comfortable Vitals:   12/25/23 1722 12/25/23 1724 12/25/23 1819 12/25/23 1906  BP:  (!) 122/92  110/84  Pulse:  75    Resp: 20 20  19   Temp:   (!) 100.6 F (38.1 C)   TempSrc:   Rectal   SpO2:  98%    Weight:      Height:       Eyes: PERRL, lids and conjunctivae normal ENMT: Mucous membranes are dry.  Neck: normal, supple, no masses, no thyromegaly Respiratory: clear to auscultation bilaterally, no wheezing, no crackles. Normal respiratory effort. No accessory muscle use.  Cardiovascular: Regular rate and rhythm, no murmurs / rubs / gallops. No extremity edema.  Extremities warm Abdomen: no tenderness, no masses palpated. No hepatosplenomegaly. Bowel sounds positive.  Musculoskeletal: no clubbing / cyanosis. No joint deformity upper and lower extremities.  Skin: no rashes, lesions, ulcers. No induration Neurologic: No facial asymmetry, limited exam, patient is quite sleepy Psychiatric: Unable to assess, patient is quite sleepy, easy to arouse, but drifts back to sleep.   Labs on Admission: I have personally reviewed following labs and imaging studies  CBC: Recent Labs  Lab 12/25/23 1630  WBC 8.7   NEUTROABS 5.4  HGB 12.4  HCT 36.2  MCV 93.5  PLT 173   Basic Metabolic Panel: Recent Labs  Lab 12/25/23 1630  NA 139  K 3.6  CL 105  CO2 22  GLUCOSE 108*  BUN 23  CREATININE 1.40*  CALCIUM 10.3   GFR: Estimated Creatinine Clearance (by C-G formula based on SCr of 1.4 mg/dL (H)) Female: 91.4 mL/min (A) Female: 33.9 mL/min (A) Liver Function Tests: Recent Labs  Lab 12/25/23 1630  AST 26  ALT 17  ALKPHOS 96  BILITOT 1.4*  PROT 7.2  ALBUMIN 3.9   Urine analysis:    Component Value Date/Time   COLORURINE YELLOW 12/25/2023 1737   APPEARANCEUR TURBID (A) 12/25/2023 1737   APPEARANCEUR Turbid (A) 01/20/2022 1450   LABSPEC 1.022 12/25/2023 1737   PHURINE 8.0 12/25/2023 1737   GLUCOSEU NEGATIVE 12/25/2023 1737   GLUCOSEU NEG mg/dL 78/29/5621 3086   HGBUR NEGATIVE 12/25/2023 1737   HGBUR moderate 06/04/2010 1425   BILIRUBINUR NEGATIVE 12/25/2023 1737   BILIRUBINUR Negative 01/20/2022 1450   KETONESUR NEGATIVE 12/25/2023 1737   PROTEINUR >=300 (A) 12/25/2023 1737   UROBILINOGEN 1.0 05/31/2014 0933   NITRITE NEGATIVE 12/25/2023 1737   LEUKOCYTESUR MODERATE (A) 12/25/2023 1737    Radiological Exams on Admission: CT Knee Right Wo Contrast Result Date: 12/25/2023 CLINICAL DATA:  Knee trauma, internal derangement suspected, xray done EXAM: CT OF THE RIGHT KNEE WITHOUT CONTRAST TECHNIQUE: Multidetector CT imaging of the right knee was performed according to the standard protocol. Multiplanar CT image reconstructions were also generated. RADIATION DOSE REDUCTION: This exam was performed according to the departmental dose-optimization program which includes automated exposure control, adjustment of the mA and/or kV according to patient size and/or use of iterative reconstruction technique. COMPARISON:  Radiograph earlier today FINDINGS: Bones/Joint/Cartilage The bones are markedly under mineralized. There is irregularity about the lateral tibial plateau without significant  articular depression, sagittal series 9, image 73 and coronal series 6, image 96 and may represent an impaction fracture. No other fracture.  Mild osteoarthritis with spurring. Large joint effusion with layering. Small Baker cyst. Chondrocalcinosis. Ligaments Suboptimally assessed by CT. Muscles and Tendons No intramuscular hematoma. Soft tissues Mild soft tissue edema. IMPRESSION: Findings suspicious for nondisplaced lateral tibial plateau fracture without significant articular depression. Large joint effusion. Marked osteoporosis limiting assessment. Electronically Signed   By: Narda Rutherford M.D.   On: 12/25/2023 17:26   CT Hip Right Wo Contrast Result Date: 12/25/2023 CLINICAL DATA:  Hip trauma, fracture suspected, xray done EXAM: CT OF THE RIGHT HIP WITHOUT CONTRAST TECHNIQUE: Multidetector CT imaging of the right hip was performed according to the standard protocol. Multiplanar CT image reconstructions were also generated. RADIATION DOSE REDUCTION: This exam was performed according to the departmental dose-optimization program which includes automated exposure control, adjustment of the mA and/or kV according to patient size and/or use of iterative reconstruction technique. COMPARISON:  Radiograph earlier today, abdominopelvic CT 09/16/2023 FINDINGS: Motion artifact limitations. Bones/Joint/Cartilage Allowing for motion, no evidence of acute fracture. Included pelvis is intact, including pubic rami. Acetabular spurring which is suboptimally assessed due to motion. No gross joint effusion. Ligaments Suboptimally assessed by CT. Muscles and Tendons Gluteal muscle atrophy.  No intramuscular hematoma. Soft tissues No acute findings. Arterial vascular calcifications. Large amount of stool in the included colon. IMPRESSION: No evidence of hip fracture, allowing for motion limitations. Electronically Signed   By: Narda Rutherford M.D.   On: 12/25/2023 17:21   DG Thoracic Spine 2 View Result Date:  12/25/2023 CLINICAL DATA:  Pain after fall. EXAM: THORACIC SPINE 2 VIEWS COMPARISON:  None Available. FINDINGS: Normal alignment. No evidence of acute fracture or compression deformity. Mild mid lower thoracic degenerative disc disease. No paravertebral soft tissue abnormality to suggest fracture. IMPRESSION: No acute fracture of the thoracic spine. Mild degenerative change. Electronically Signed   By: Narda Rutherford M.D.   On: 12/25/2023 15:42   DG Lumbar Spine Complete Result Date: 12/25/2023 CLINICAL DATA:  Pain after fall. EXAM: LUMBAR SPINE - COMPLETE 4+ VIEW COMPARISON:  None Available. FINDINGS: Five non-rib-bearing lumbar vertebra. Normal alignment. No evidence of fracture or compression deformity. Posterior elements are suboptimally assessed due to bony under mineralization and overlying bowel gas. Degenerative disc disease with disc space narrowing and spurring L2-L3 and L3-L4. Scattered facet hypertrophy. IMPRESSION: No evidence of lumbar spine fracture. Multilevel degenerative change. Electronically Signed   By: Narda Rutherford M.D.   On: 12/25/2023 15:42   DG Ankle Complete Right Result Date: 12/25/2023 CLINICAL DATA:  Pain after injury. EXAM: RIGHT FOOT COMPLETE - 3+ VIEW; RIGHT ANKLE - COMPLETE 3+ VIEW COMPARISON:  None Available. FINDINGS: Ankle: The bones are subjectively under mineralized which limits assessment. No evidence of acute fracture. The ankle mortise is preserved. No definite focal soft tissue abnormality. Foot: No evidence of acute fracture. Moderate hallux valgus and degenerative change of the first metatarsal phalangeal joint. Hammertoe deformity of the toes which further limits digit assessment. Minor talonavicular degenerative spurring. Question of mild generalized subcutaneous edema. IMPRESSION: 1. No evidence of acute fracture of the right foot or ankle. 2. Hallux valgus and degenerative change of the first metatarsal phalangeal joint. Hammertoe deformity of the toes.  Electronically Signed   By: Narda Rutherford M.D.   On: 12/25/2023 15:40   DG Foot Complete Right Result Date: 12/25/2023 CLINICAL DATA:  Pain after injury. EXAM: RIGHT FOOT COMPLETE - 3+ VIEW; RIGHT ANKLE - COMPLETE 3+ VIEW COMPARISON:  None Available. FINDINGS: Ankle: The bones are subjectively under mineralized which limits assessment. No evidence  of acute fracture. The ankle mortise is preserved. No definite focal soft tissue abnormality. Foot: No evidence of acute fracture. Moderate hallux valgus and degenerative change of the first metatarsal phalangeal joint. Hammertoe deformity of the toes which further limits digit assessment. Minor talonavicular degenerative spurring. Question of mild generalized subcutaneous edema. IMPRESSION: 1. No evidence of acute fracture of the right foot or ankle. 2. Hallux valgus and degenerative change of the first metatarsal phalangeal joint. Hammertoe deformity of the toes. Electronically Signed   By: Narda Rutherford M.D.   On: 12/25/2023 15:40   DG Knee Complete 4 Views Right Result Date: 12/25/2023 CLINICAL DATA:  Right knee pain after injury. EXAM: RIGHT KNEE - COMPLETE 4+ VIEW COMPARISON:  12/01/2021 FINDINGS: No acute fracture or dislocation. Tricompartmental osteoarthritis with joint space narrowing and spurring. There is a large joint effusion but no evidence of lipohemarthrosis. The bones are subjectively under mineralized. Chondrocalcinosis. IMPRESSION: 1. Large joint effusion. 2. No evidence of fracture dislocation, however in the setting of joint effusion, consider further assessment with CT to assess for radiographically occult fracture. Electronically Signed   By: Narda Rutherford M.D.   On: 12/25/2023 15:39   DG Hip Unilat W or Wo Pelvis 2-3 Views Right Result Date: 12/25/2023 CLINICAL DATA:  Pain after fall. EXAM: DG HIP (WITH OR WITHOUT PELVIS) 2-3V RIGHT COMPARISON:  Hip radiograph 12/01/2021 FINDINGS: No acute fracture of the pelvis or right hip. No hip  dislocation. Mild right hip osteoarthritis. Chronic calcification in the right hamstrings insertion about the ischium. Pubic rami are intact. Pubic symphysis and sacroiliac joints are congruent. The bones are subjectively under mineralized. IMPRESSION: No acute fracture of the pelvis or right hip. Electronically Signed   By: Narda Rutherford M.D.   On: 12/25/2023 15:37   CT Head Wo Contrast Result Date: 12/25/2023 CLINICAL DATA:  Fall last night EXAM: CT HEAD WITHOUT CONTRAST CT CERVICAL SPINE WITHOUT CONTRAST TECHNIQUE: Multidetector CT imaging of the head and cervical spine was performed following the standard protocol without intravenous contrast. Multiplanar CT image reconstructions of the cervical spine were also generated. RADIATION DOSE REDUCTION: This exam was performed according to the departmental dose-optimization program which includes automated exposure control, adjustment of the mA and/or kV according to patient size and/or use of iterative reconstruction technique. COMPARISON:  12/01/2021 FINDINGS: CT HEAD FINDINGS Brain: No evidence of acute infarction, hemorrhage, hydrocephalus, extra-axial collection or mass lesion/mass effect. Mild periventricular white matter hypodensity. Nonacute lacunar infarction of the right basal ganglia. Vascular: No hyperdense vessel or unexpected calcification. Skull: Normal. Negative for fracture or focal lesion. Sinuses/Orbits: No acute finding. Other: None. CT CERVICAL SPINE FINDINGS Alignment: Degenerative straightening of the normal cervical lordosis. Skull base and vertebrae: No acute fracture. No primary bone lesion or focal pathologic process. Soft tissues and spinal canal: No prevertebral fluid or swelling. No visible canal hematoma. Disc levels: Moderate disc space height loss and osteophytosis, worst from C5-C7 Upper chest: Negative. Other: None. IMPRESSION: 1. No acute intracranial pathology. Small-vessel white matter disease and nonacute lacunar infarction  of the right basal ganglia. 2. No fracture or static subluxation of the cervical spine. 3. Moderate cervical disc degenerative disease. Electronically Signed   By: Jearld Lesch M.D.   On: 12/25/2023 15:29   CT Cervical Spine Wo Contrast Result Date: 12/25/2023 CLINICAL DATA:  Fall last night EXAM: CT HEAD WITHOUT CONTRAST CT CERVICAL SPINE WITHOUT CONTRAST TECHNIQUE: Multidetector CT imaging of the head and cervical spine was performed following the standard protocol without intravenous  contrast. Multiplanar CT image reconstructions of the cervical spine were also generated. RADIATION DOSE REDUCTION: This exam was performed according to the departmental dose-optimization program which includes automated exposure control, adjustment of the mA and/or kV according to patient size and/or use of iterative reconstruction technique. COMPARISON:  12/01/2021 FINDINGS: CT HEAD FINDINGS Brain: No evidence of acute infarction, hemorrhage, hydrocephalus, extra-axial collection or mass lesion/mass effect. Mild periventricular white matter hypodensity. Nonacute lacunar infarction of the right basal ganglia. Vascular: No hyperdense vessel or unexpected calcification. Skull: Normal. Negative for fracture or focal lesion. Sinuses/Orbits: No acute finding. Other: None. CT CERVICAL SPINE FINDINGS Alignment: Degenerative straightening of the normal cervical lordosis. Skull base and vertebrae: No acute fracture. No primary bone lesion or focal pathologic process. Soft tissues and spinal canal: No prevertebral fluid or swelling. No visible canal hematoma. Disc levels: Moderate disc space height loss and osteophytosis, worst from C5-C7 Upper chest: Negative. Other: None. IMPRESSION: 1. No acute intracranial pathology. Small-vessel white matter disease and nonacute lacunar infarction of the right basal ganglia. 2. No fracture or static subluxation of the cervical spine. 3. Moderate cervical disc degenerative disease. Electronically  Signed   By: Jearld Lesch M.D.   On: 12/25/2023 15:29   EKG: None  Assessment/Plan Principal Problem:   Closed fracture of lateral portion of right tibial plateau Active Problems:   Acute cystitis without hematuria   Essential hypertension   CKD (chronic kidney disease) stage 3, GFR 30-59 ml/min (HCC)   History of schizophrenia   HIV disease (HCC)   Dementia with behavioral disturbance (HCC)  Assessment and Plan: * Closed fracture of lateral portion of right tibial plateau As seen on CT of the right knee.  Status post fall.  Patient is nonambulatory at baseline.  Nursing home resident. Head and cervical CT including imaging of right foot, ankle, pelvis, thoracic and lumbar spine all negative for acute abnormality. - EDP to orthopedist Dr. Ave Filter, recommended conservative management-no weightbearing, and knee immobilizer  -Hydrocodone as needed  Acute cystitis without hematuria Presenting with fever of 100.6, diarrheal episodes in the ED.  Rules out for sepsis.  WBC 8.7.  UA suggestive of UTI with moderate leukocytes and many bacteria.  Stool C. difficile negative.  Chest x-ray without acute abnormality. -Recent urine cultures growing multidrug-resistant Proteus, Enterococcus. -Consulted pharm for assistance with choice of antibiotics-Zosyn only is okay.   -Check COVID, influenza and RSV-negative - Add-on urine cultures -Appears to be dehydrated, continue LR at 75 cc/h for 1 day  Essential hypertension Stable. -Resume losartan,  Dementia with behavioral disturbance (HCC) Stable. -Resume venlafaxine, donepezil  HIV disease (HCC) 05/2023- Last HIV viral load not detected.  CD4 helper cells 506. -Resume Biktarvy  CKD (chronic kidney disease) stage 3, GFR 30-59 ml/min (HCC) CKD stage IIIb.  Creatinine 1.4.  Stable.   DVT prophylaxis: Lovenox Code Status: MOST form at bedside states Limited code.  ACP documents reviewed, DNR form on file. Family Communication: None at  bedside. Disposition Plan: > 2 days Consults called: None Admission status: Inpt Med surg I certify that at the point of admission it is my clinical judgment that the patient will require inpatient hospital care spanning beyond 2 midnights from the point of admission due to high intensity of service, high risk for further deterioration and high frequency of surveillance required.   Author: Onnie Boer, MD 12/25/2023 10:27 PM  For on call review [email or web address].

## 2023-12-26 ENCOUNTER — Encounter (HOSPITAL_COMMUNITY): Payer: Self-pay | Admitting: Internal Medicine

## 2023-12-26 DIAGNOSIS — N1832 Chronic kidney disease, stage 3b: Secondary | ICD-10-CM | POA: Diagnosis not present

## 2023-12-26 DIAGNOSIS — Z66 Do not resuscitate: Secondary | ICD-10-CM

## 2023-12-26 DIAGNOSIS — F03918 Unspecified dementia, unspecified severity, with other behavioral disturbance: Secondary | ICD-10-CM | POA: Diagnosis not present

## 2023-12-26 DIAGNOSIS — N3000 Acute cystitis without hematuria: Secondary | ICD-10-CM | POA: Diagnosis not present

## 2023-12-26 DIAGNOSIS — S82121A Displaced fracture of lateral condyle of right tibia, initial encounter for closed fracture: Secondary | ICD-10-CM | POA: Diagnosis not present

## 2023-12-26 MED ORDER — BICTEGRAVIR-EMTRICITAB-TENOFOV 50-200-25 MG PO TABS
1.0000 | ORAL_TABLET | Freq: Every day | ORAL | Status: DC
  Administered 2023-12-26 – 2023-12-28 (×3): 1 via ORAL
  Filled 2023-12-26 (×4): qty 1

## 2023-12-26 MED ORDER — PANTOPRAZOLE SODIUM 40 MG PO TBEC
40.0000 mg | DELAYED_RELEASE_TABLET | Freq: Every day | ORAL | Status: DC
  Administered 2023-12-26 – 2023-12-28 (×3): 40 mg via ORAL
  Filled 2023-12-26 (×3): qty 1

## 2023-12-26 MED ORDER — VENLAFAXINE HCL ER 75 MG PO CP24
75.0000 mg | ORAL_CAPSULE | Freq: Every day | ORAL | Status: DC
  Administered 2023-12-26 – 2023-12-28 (×3): 75 mg via ORAL
  Filled 2023-12-26 (×3): qty 1

## 2023-12-26 MED ORDER — PANTOPRAZOLE SODIUM 20 MG PO TBEC
20.0000 mg | DELAYED_RELEASE_TABLET | Freq: Every day | ORAL | Status: DC
  Filled 2023-12-26 (×2): qty 1

## 2023-12-26 MED ORDER — DONEPEZIL HCL 5 MG PO TABS
10.0000 mg | ORAL_TABLET | Freq: Every day | ORAL | Status: DC
  Administered 2023-12-26 – 2023-12-27 (×3): 10 mg via ORAL
  Filled 2023-12-26 (×3): qty 2

## 2023-12-26 MED ORDER — IPRATROPIUM-ALBUTEROL 0.5-2.5 (3) MG/3ML IN SOLN: 3.0000 mL | Freq: Four times a day (QID) | RESPIRATORY_TRACT | Status: DC | PRN

## 2023-12-26 MED ORDER — DONEPEZIL HCL 5 MG PO TABS
5.0000 mg | ORAL_TABLET | Freq: Two times a day (BID) | ORAL | Status: DC
Start: 2023-12-26 — End: 2023-12-26

## 2023-12-26 MED ORDER — ARIPIPRAZOLE 5 MG PO TABS
5.0000 mg | ORAL_TABLET | Freq: Every day | ORAL | Status: DC
  Administered 2023-12-26 – 2023-12-28 (×3): 5 mg via ORAL
  Filled 2023-12-26 (×3): qty 1

## 2023-12-26 MED ORDER — DONEPEZIL HCL 5 MG PO TABS
5.0000 mg | ORAL_TABLET | Freq: Every day | ORAL | Status: DC
  Administered 2023-12-26 – 2023-12-28 (×3): 5 mg via ORAL
  Filled 2023-12-26 (×3): qty 1

## 2023-12-26 MED ORDER — ENOXAPARIN SODIUM 30 MG/0.3ML IJ SOSY
30.0000 mg | PREFILLED_SYRINGE | INTRAMUSCULAR | Status: DC
  Administered 2023-12-26 – 2023-12-28 (×3): 30 mg via SUBCUTANEOUS
  Filled 2023-12-26 (×3): qty 0.3

## 2023-12-26 MED ORDER — MIRTAZAPINE 15 MG PO TABS
15.0000 mg | ORAL_TABLET | Freq: Every day | ORAL | Status: DC
  Administered 2023-12-26 – 2023-12-27 (×3): 15 mg via ORAL
  Filled 2023-12-26 (×3): qty 1

## 2023-12-26 MED ORDER — LOSARTAN POTASSIUM 25 MG PO TABS
12.5000 mg | ORAL_TABLET | Freq: Every day | ORAL | Status: DC
  Administered 2023-12-26 – 2023-12-27 (×2): 12.5 mg via ORAL
  Filled 2023-12-26 (×2): qty 1

## 2023-12-26 MED ORDER — ONDANSETRON HCL 4 MG PO TABS
4.0000 mg | ORAL_TABLET | Freq: Four times a day (QID) | ORAL | Status: DC | PRN
Start: 2023-12-26 — End: 2023-12-28

## 2023-12-26 MED ORDER — ONDANSETRON HCL 4 MG/2ML IJ SOLN
4.0000 mg | Freq: Four times a day (QID) | INTRAMUSCULAR | Status: DC | PRN
  Administered 2023-12-28: 4 mg via INTRAVENOUS
  Filled 2023-12-26: qty 2

## 2023-12-26 MED ORDER — FENTANYL CITRATE PF 50 MCG/ML IJ SOSY
25.0000 ug | PREFILLED_SYRINGE | INTRAMUSCULAR | Status: DC | PRN
  Administered 2023-12-26 (×2): 25 ug via INTRAVENOUS
  Filled 2023-12-26 (×3): qty 1

## 2023-12-26 MED ORDER — MIRABEGRON ER 25 MG PO TB24
50.0000 mg | ORAL_TABLET | Freq: Every day | ORAL | Status: DC
  Administered 2023-12-26 – 2023-12-28 (×3): 50 mg via ORAL
  Filled 2023-12-26 (×3): qty 2

## 2023-12-26 NOTE — Subjective & Objective (Signed)
 Pt seen and examined. Much more awake and alert this AM. Urine in purewick container much lighter in color after IVF overnight.  Ready for DC to SNF.

## 2023-12-26 NOTE — Assessment & Plan Note (Signed)
 12-26-2023 stable. 12-27-2023 stable. Chronic.  12-28-2023 stable.

## 2023-12-26 NOTE — Hospital Course (Signed)
 HPI: [name] is a 83 y.o. adult with medical history significant for COPD, dementia, depression, hypertension, schizophrenia, HIV, ischemic colitis, large bowel perforation colostomy status. Patient was brought to the ED from nursing home reports of a fall with subsequent swelling to the right knee, and pain to the right leg.  Per EDP, at baseline patient is nonambulatory.  On my evaluation, patient is sleeping, she is arousable, answers a few questions, knows she is in the hospital, answers a few questions but quickly drifts back to sleep.   ED Course: Febrile to 100.6.  Heart rate 70s.  Respiratory rate 13-20.  Blood pressure systolic 110-153.  O2 sats greater than 95% on room air. UA suggestive of UTI.  WBC 8.9. Imaging included right knee CT-findings suspicious for nondisplaced lateral tibial plateau fracture without significant articular depression and a large joint effusion. Right hip x-ray, lumbar and thoracic spine x-ray, right foot and right ankle pain, pelvic x-ray, head and cervical CT-all negative for acute abnormality. EDP talked to orthopedist-conservative management with knee immobilizer. IV ceftriaxone given. Hospitalist to admit for fall, right knee fracture, and UTI.  Significant Events: Admitted 12/25/2023 for closed fracture of right tibia plateau, acute cystitis   Significant Labs: WBC 8.7, Hgb 12.4, Plt 173 Na 139, K 3.6, CO2 of 22, BUN 23, Scr 1.4, glu 108  Significant Imaging Studies: CT head/c-spine No acute intracranial pathology. Small-vessel white matter disease and nonacute lacunar infarction of the right basal ganglia. 2. No fracture or static subluxation of the cervical spine. 3. Moderate cervical disc degenerative disease. Right Knee XR shows Large joint effusion. 2. No evidence of fracture dislocation, however in the setting of joint effusion, consider further assessment with CT to assess for radiographically occult fracture. Pelvic XR shows No acute  fracture of the pelvis or right hip.  Right Ankle/Foot XR shows No evidence of acute fracture of the right foot or ankle. 2. Hallux valgus and degenerative change of the first metatarsal phalangeal joint. Hammertoe deformity of the toes. T-spine XR shows No acute fracture of the thoracic spine. Mild degenerative change L-spine XR shows No evidence of lumbar spine fracture. Multilevel degenerative change. CT right hip/knee shows No evidence of hip fracture, allowing for motion limitations. Findings suspicious for nondisplaced lateral tibial plateau fracture without significant articular depression. Large joint effusion.  Antibiotic Therapy: Anti-infectives (From admission, onward)    Start     Dose/Rate Route Frequency Ordered Stop   12/26/23 1000  bictegravir-emtricitabine-tenofovir AF (BIKTARVY) 50-200-25 MG per tablet 1 tablet        1 tablet Oral Daily 12/26/23 0014     12/26/23 0600  piperacillin-tazobactam (ZOSYN) IVPB 3.375 g        3.375 g 12.5 mL/hr over 240 Minutes Intravenous Every 8 hours 12/25/23 2257     12/25/23 2230  piperacillin-tazobactam (ZOSYN) IVPB 3.375 g        3.375 g 100 mL/hr over 30 Minutes Intravenous  Once 12/25/23 2226 12/25/23 2258   12/25/23 1930  cefTRIAXone (ROCEPHIN) 1 g in sodium chloride 0.9 % 100 mL IVPB        1 g 200 mL/hr over 30 Minutes Intravenous  Once 12/25/23 1920 12/25/23 2046       Procedures:   Consultants:

## 2023-12-26 NOTE — Assessment & Plan Note (Deleted)
 12-26-2023 chronic.

## 2023-12-26 NOTE — Assessment & Plan Note (Signed)
 12-26-2023 pt is DNR/DNI

## 2023-12-26 NOTE — Plan of Care (Signed)

## 2023-12-26 NOTE — Progress Notes (Signed)
 PROGRESS NOTE    [name]  BJY:[id number] DOB: 16-Dec-1940 DOA: 12/25/2023 PCP: Charlynne Pander, MD  Subjective: Pt seen and examined. Difficult to arouse. No family at bedside. Chart reviewed. No operative intervention planned. Pt is bedbound at baseline in SNF.  On IV meropenem for presumed UTI.   Hospital Course: HPI: [name] is a 83 y.o. adult with medical history significant for COPD, dementia, depression, hypertension, schizophrenia, HIV, ischemic colitis, large bowel perforation colostomy status. Patient was brought to the ED from nursing home reports of a fall with subsequent swelling to the right knee, and pain to the right leg.  Per EDP, at baseline patient is nonambulatory.  On my evaluation, patient is sleeping, she is arousable, answers a few questions, knows she is in the hospital, answers a few questions but quickly drifts back to sleep.   ED Course: Febrile to 100.6.  Heart rate 70s.  Respiratory rate 13-20.  Blood pressure systolic 110-153.  O2 sats greater than 95% on room air. UA suggestive of UTI.  WBC 8.9. Imaging included right knee CT-findings suspicious for nondisplaced lateral tibial plateau fracture without significant articular depression and a large joint effusion. Right hip x-ray, lumbar and thoracic spine x-ray, right foot and right ankle pain, pelvic x-ray, head and cervical CT-all negative for acute abnormality. EDP talked to orthopedist-conservative management with knee immobilizer. IV ceftriaxone given. Hospitalist to admit for fall, right knee fracture, and UTI.  Significant Events: Admitted 12/25/2023 for closed fracture of right tibia plateau, acute cystitis   Significant Labs: WBC 8.7, Hgb 12.4, Plt 173 Na 139, K 3.6, CO2 of 22, BUN 23, Scr 1.4, glu 108  Significant Imaging Studies: CT head/c-spine No acute intracranial pathology. Small-vessel white matter disease and nonacute lacunar infarction of the right basal ganglia. 2. No fracture or  static subluxation of the cervical spine. 3. Moderate cervical disc degenerative disease. Right Knee XR shows Large joint effusion. 2. No evidence of fracture dislocation, however in the setting of joint effusion, consider further assessment with CT to assess for radiographically occult fracture. Pelvic XR shows No acute fracture of the pelvis or right hip.  Right Ankle/Foot XR shows No evidence of acute fracture of the right foot or ankle. 2. Hallux valgus and degenerative change of the first metatarsal phalangeal joint. Hammertoe deformity of the toes. T-spine XR shows No acute fracture of the thoracic spine. Mild degenerative change L-spine XR shows No evidence of lumbar spine fracture. Multilevel degenerative change. CT right hip/knee shows No evidence of hip fracture, allowing for motion limitations. Findings suspicious for nondisplaced lateral tibial plateau fracture without significant articular depression. Large joint effusion.  Antibiotic Therapy: Anti-infectives (From admission, onward)    Start     Dose/Rate Route Frequency Ordered Stop   12/26/23 1000  bictegravir-emtricitabine-tenofovir AF (BIKTARVY) 50-200-25 MG per tablet 1 tablet        1 tablet Oral Daily 12/26/23 0014     12/26/23 0600  piperacillin-tazobactam (ZOSYN) IVPB 3.375 g        3.375 g 12.5 mL/hr over 240 Minutes Intravenous Every 8 hours 12/25/23 2257     12/25/23 2230  piperacillin-tazobactam (ZOSYN) IVPB 3.375 g        3.375 g 100 mL/hr over 30 Minutes Intravenous  Once 12/25/23 2226 12/25/23 2258   12/25/23 1930  cefTRIAXone (ROCEPHIN) 1 g in sodium chloride 0.9 % 100 mL IVPB        1 g 200 mL/hr over 30 Minutes Intravenous  Once  12/25/23 1920 12/25/23 2046       Procedures:   Consultants:     Assessment and Plan: * Closed fracture of lateral portion of right tibial plateau On admission. As seen on CT of the right knee.  Status post fall.  Patient is nonambulatory at baseline.  Nursing home  resident. Head and cervical CT including imaging of right foot, ankle, pelvis, thoracic and lumbar spine all negative for acute abnormality. - EDP to orthopedist Dr. Ave Filter, recommended conservative management-no weightbearing, and knee immobilizer  -Hydrocodone as needed  12-26-2023 continue with knee immobilizer. Outpatient referral to Dr. Ave Filter at discharge..   Acute cystitis without hematuria On admission. Presenting with fever of 100.6, diarrheal episodes in the ED.  Rules out for sepsis.  WBC 8.7.  UA suggestive of UTI with moderate leukocytes and many bacteria.  Stool C. difficile negative.  Chest x-ray without acute abnormality. -Recent urine cultures growing multidrug-resistant Proteus, Enterococcus. -Consulted pharm for assistance with choice of antibiotics-Zosyn only is okay.   -Check COVID, influenza and RSV-negative - Add-on urine cultures -Appears to be dehydrated, continue LR at 75 cc/h for 1 day.  12-26-2023 awaiting urine cultures. Continue IV zosyn for now.  DNR (do not resuscitate)/DNI(Do Not Intubate) 12-26-2023 pt is DNR/DNI   Dementia with behavioral disturbance (HCC) On admission.  Stable. -Resume venlafaxine, donepezil  12-26-2023 stable.  HIV disease (HCC) On admission. 05/2023- Last HIV viral load not detected.  CD4 helper cells 506. -Resume Biktarvy  12-26-2023 follows with outpatient ID clinic.  History of schizophrenia 12-26-2023 stable.  Colostomy in place [facility]) 12-26-2023 chronic.  CKD stage 3b, GFR 30-44 ml/min (HCC) - baseline SCr 1.2-1.6 On admission. Creatinine 1.4.  Stable.  12-26-2023 stable. Chronic.  Essential hypertension On admission. Stable. -Resume losartan  12-26-2023 stable. Chronic. On losartan 12.5 mg daily.  DVT prophylaxis: enoxaparin (LOVENOX) injection 30 mg Start: 12/26/23 1000    Code Status: Limited: Do not attempt resuscitation (DNR) -DNR-LIMITED -Do Not Intubate/DNI  Family Communication: no family at  bedside Disposition Plan: return to SNF Reason for continuing need for hospitalization: on IV zosyn. Awaiting urine cx results.  Objective: Vitals:   12/25/23 2329 12/25/23 2330 12/26/23 0017 12/26/23 0542  BP:  111/64 109/77 (!) 124/57  Pulse: 64  67 73  Resp: 20  16 18   Temp:   98.3 F (36.8 C) 98.2 F (36.8 C)  TempSrc:    Oral  SpO2: 95%  97% 98%  Weight:      Height:        Intake/Output Summary (Last 24 hours) at 12/26/2023 0934 Last data filed at 12/25/2023 2258 Gross per 24 hour  Intake 140.64 ml  Output --  Net 140.64 ml   Filed Weights   12/25/23 1405  Weight: 59 kg   Examination:  Physical Exam Vitals and nursing note reviewed.  Constitutional:      General: She is not in acute distress.    Comments: Appears chronically ill Arousable to gentle physical stimuli  HENT:     Head: Normocephalic and atraumatic.  Eyes:     General: No scleral icterus. Cardiovascular:     Rate and Rhythm: Normal rate and regular rhythm.  Pulmonary:     Effort: Pulmonary effort is normal.     Breath sounds: Normal breath sounds.  Abdominal:     General: Bowel sounds are normal.     Palpations: Abdomen is soft.  Musculoskeletal:     Comments: Right knee in immobilizier  Skin:  General: Skin is warm and dry.     Capillary Refill: Capillary refill takes less than 2 seconds.  Neurological:     Mental Status: She is disoriented.    Data Reviewed: I have personally reviewed following labs and imaging studies  CBC: Recent Labs  Lab 12/25/23 1630  WBC 8.7  NEUTROABS 5.4  HGB 12.4  HCT 36.2  MCV 93.5  PLT 173   Basic Metabolic Panel: Recent Labs  Lab 12/25/23 1630  NA 139  K 3.6  CL 105  CO2 22  GLUCOSE 108*  BUN 23  CREATININE 1.40*  CALCIUM 10.3   GFR: Estimated Creatinine Clearance: 28.9 mL/min (A) (by C-G formula based on SCr of 1.4 mg/dL (H)). Liver Function Tests: Recent Labs  Lab 12/25/23 1630  AST 26  ALT 17  ALKPHOS 96  BILITOT 1.4*  PROT  7.2  ALBUMIN 3.9   Sepsis Labs: Recent Labs  Lab 12/25/23 2002  LATICACIDVEN 0.7    Recent Results (from the past 240 hours)  C Difficile Quick Screen w PCR reflex     Status: None   Collection Time: 12/25/23  4:06 PM   Specimen: Stool  Result Value Ref Range Status   C Diff antigen NEGATIVE NEGATIVE Final   C Diff toxin NEGATIVE NEGATIVE Final   C Diff interpretation No C. difficile detected.  Final    Comment: Performed at [facility], [address], [place], [place] [address]  Culture, blood (routine x 2)     Status: None (Preliminary result)   Collection Time: 12/25/23  8:02 PM   Specimen: BLOOD RIGHT ARM  Result Value Ref Range Status   Specimen Description BLOOD RIGHT ARM  Final   Special Requests   Final    BOTTLES DRAWN AEROBIC AND ANAEROBIC Blood Culture adequate volume   Culture   Final    NO GROWTH < 12 HOURS Performed at [facility], [address], [place], [place] [address]    Report Status PENDING  Incomplete  Culture, blood (routine x 2)     Status: None (Preliminary result)   Collection Time: 12/25/23  8:16 PM   Specimen: Left Antecubital; Blood  Result Value Ref Range Status   Specimen Description LEFT ANTECUBITAL  Final   Special Requests   Final    BOTTLES DRAWN AEROBIC AND ANAEROBIC Blood Culture adequate volume   Culture   Final    NO GROWTH < 12 HOURS Performed at [facility], [address] [place], [place] [address]    Report Status PENDING  Incomplete  Resp panel by RT-PCR (RSV, Flu A&B, Covid) Anterior Nasal Swab     Status: None   Collection Time: 12/25/23  8:35 PM   Specimen: Anterior Nasal Swab  Result Value Ref Range Status   SARS Coronavirus 2 by RT PCR NEGATIVE NEGATIVE Final    Comment: (NOTE) SARS-CoV-2 target nucleic acids are NOT DETECTED.  The SARS-CoV-2 RNA is generally detectable in upper respiratory specimens during the acute phase of infection. The lowest concentration of SARS-CoV-2 viral copies this assay can detect  is 138 copies/mL. A negative result does not preclude SARS-Cov-2 infection and should not be used as the sole basis for treatment or other patient management decisions. A negative result may occur with  improper specimen collection/handling, submission of specimen other than nasopharyngeal swab, presence of viral mutation(s) within the areas targeted by this assay, and inadequate number of viral copies(<138 copies/mL). A negative result must be combined with clinical observations, patient  history, and epidemiological information. The expected result is Negative.  Fact Sheet for Patients:  BloggerCourse.com  Fact Sheet for Healthcare Providers:  SeriousBroker.it  This test is no t yet approved or cleared by the [place] FDA and  has been authorized for detection and/or diagnosis of SARS-CoV-2 by FDA under an Emergency Use Authorization (EUA). This EUA will remain  in effect (meaning this test can be used) for the duration of the COVID-19 declaration under Section 564(b)(1) of the Act, 21 U.S.C.section 360bbb-3(b)(1), unless the authorization is terminated  or revoked sooner.       Influenza A by PCR NEGATIVE NEGATIVE Final   Influenza B by PCR NEGATIVE NEGATIVE Final    Comment: (NOTE) The Xpert Xpress SARS-CoV-2/FLU/RSV plus assay is intended as an aid in the diagnosis of influenza from Nasopharyngeal swab specimens and should not be used as a sole basis for treatment. Nasal washings and aspirates are unacceptable for Xpert Xpress SARS-CoV-2/FLU/RSV testing.  Fact Sheet for Patients: BloggerCourse.com  Fact Sheet for Healthcare Providers: SeriousBroker.it  This test is not yet approved or cleared by the [place] FDA and has been authorized for detection and/or diagnosis of SARS-CoV-2 by FDA under an Emergency Use Authorization (EUA). This EUA will remain in effect  (meaning this test can be used) for the duration of the COVID-19 declaration under Section 564(b)(1) of the Act, 21 U.S.C. section 360bbb-3(b)(1), unless the authorization is terminated or revoked.     Resp Syncytial Virus by PCR NEGATIVE NEGATIVE Final    Comment: (NOTE) Fact Sheet for Patients: BloggerCourse.com  Fact Sheet for Healthcare Providers: SeriousBroker.it  This test is not yet approved or cleared by the [place] FDA and has been authorized for detection and/or diagnosis of SARS-CoV-2 by FDA under an Emergency Use Authorization (EUA). This EUA will remain in effect (meaning this test can be used) for the duration of the COVID-19 declaration under Section 564(b)(1) of the Act, 21 U.S.C. section 360bbb-3(b)(1), unless the authorization is terminated or revoked.  Performed at [facility], [address] [place], [place] [address]      Radiology Studies: CT Knee Right Wo Contrast Result Date: 12/25/2023 CLINICAL DATA:  Knee trauma, internal derangement suspected, xray done EXAM: CT OF THE RIGHT KNEE WITHOUT CONTRAST TECHNIQUE: Multidetector CT imaging of the right knee was performed according to the standard protocol. Multiplanar CT image reconstructions were also generated. RADIATION DOSE REDUCTION: This exam was performed according to the departmental dose-optimization program which includes automated exposure control, adjustment of the mA and/or kV according to patient size and/or use of iterative reconstruction technique. COMPARISON:  Radiograph earlier today FINDINGS: Bones/Joint/Cartilage The bones are markedly under mineralized. There is irregularity about the lateral tibial plateau without significant articular depression, sagittal series 9, image 73 and coronal series 6, image 96 and may represent an impaction fracture. No other fracture. Mild osteoarthritis with spurring. Large joint effusion with layering. Small  Baker cyst. Chondrocalcinosis. Ligaments Suboptimally assessed by CT. Muscles and Tendons No intramuscular hematoma. Soft tissues Mild soft tissue edema. IMPRESSION: Findings suspicious for nondisplaced lateral tibial plateau fracture without significant articular depression. Large joint effusion. Marked osteoporosis limiting assessment. Electronically Signed   By: Narda Rutherford M.D.   On: 12/25/2023 17:26   CT Hip Right Wo Contrast Result Date: 12/25/2023 CLINICAL DATA:  Hip trauma, fracture suspected, xray done EXAM: CT OF THE RIGHT HIP WITHOUT CONTRAST TECHNIQUE: Multidetector CT imaging of the right hip was performed according to the standard protocol. Multiplanar CT image  reconstructions were also generated. RADIATION DOSE REDUCTION: This exam was performed according to the departmental dose-optimization program which includes automated exposure control, adjustment of the mA and/or kV according to patient size and/or use of iterative reconstruction technique. COMPARISON:  Radiograph earlier today, abdominopelvic CT 09/16/2023 FINDINGS: Motion artifact limitations. Bones/Joint/Cartilage Allowing for motion, no evidence of acute fracture. Included pelvis is intact, including pubic rami. Acetabular spurring which is suboptimally assessed due to motion. No gross joint effusion. Ligaments Suboptimally assessed by CT. Muscles and Tendons Gluteal muscle atrophy.  No intramuscular hematoma. Soft tissues No acute findings. Arterial vascular calcifications. Large amount of stool in the included colon. IMPRESSION: No evidence of hip fracture, allowing for motion limitations. Electronically Signed   By: Narda Rutherford M.D.   On: 12/25/2023 17:21   DG Thoracic Spine 2 View Result Date: 12/25/2023 CLINICAL DATA:  Pain after fall. EXAM: THORACIC SPINE 2 VIEWS COMPARISON:  None Available. FINDINGS: Normal alignment. No evidence of acute fracture or compression deformity. Mild mid lower thoracic degenerative disc  disease. No paravertebral soft tissue abnormality to suggest fracture. IMPRESSION: No acute fracture of the thoracic spine. Mild degenerative change. Electronically Signed   By: Narda Rutherford M.D.   On: 12/25/2023 15:42   DG Lumbar Spine Complete Result Date: 12/25/2023 CLINICAL DATA:  Pain after fall. EXAM: LUMBAR SPINE - COMPLETE 4+ VIEW COMPARISON:  None Available. FINDINGS: Five non-rib-bearing lumbar vertebra. Normal alignment. No evidence of fracture or compression deformity. Posterior elements are suboptimally assessed due to bony under mineralization and overlying bowel gas. Degenerative disc disease with disc space narrowing and spurring L2-L3 and L3-L4. Scattered facet hypertrophy. IMPRESSION: No evidence of lumbar spine fracture. Multilevel degenerative change. Electronically Signed   By: Narda Rutherford M.D.   On: 12/25/2023 15:42   DG Ankle Complete Right Result Date: 12/25/2023 CLINICAL DATA:  Pain after injury. EXAM: RIGHT FOOT COMPLETE - 3+ VIEW; RIGHT ANKLE - COMPLETE 3+ VIEW COMPARISON:  None Available. FINDINGS: Ankle: The bones are subjectively under mineralized which limits assessment. No evidence of acute fracture. The ankle mortise is preserved. No definite focal soft tissue abnormality. Foot: No evidence of acute fracture. Moderate hallux valgus and degenerative change of the first metatarsal phalangeal joint. Hammertoe deformity of the toes which further limits digit assessment. Minor talonavicular degenerative spurring. Question of mild generalized subcutaneous edema. IMPRESSION: 1. No evidence of acute fracture of the right foot or ankle. 2. Hallux valgus and degenerative change of the first metatarsal phalangeal joint. Hammertoe deformity of the toes. Electronically Signed   By: Narda Rutherford M.D.   On: 12/25/2023 15:40   DG Foot Complete Right Result Date: 12/25/2023 CLINICAL DATA:  Pain after injury. EXAM: RIGHT FOOT COMPLETE - 3+ VIEW; RIGHT ANKLE - COMPLETE 3+ VIEW  COMPARISON:  None Available. FINDINGS: Ankle: The bones are subjectively under mineralized which limits assessment. No evidence of acute fracture. The ankle mortise is preserved. No definite focal soft tissue abnormality. Foot: No evidence of acute fracture. Moderate hallux valgus and degenerative change of the first metatarsal phalangeal joint. Hammertoe deformity of the toes which further limits digit assessment. Minor talonavicular degenerative spurring. Question of mild generalized subcutaneous edema. IMPRESSION: 1. No evidence of acute fracture of the right foot or ankle. 2. Hallux valgus and degenerative change of the first metatarsal phalangeal joint. Hammertoe deformity of the toes. Electronically Signed   By: Narda Rutherford M.D.   On: 12/25/2023 15:40   DG Knee Complete 4 Views Right Result Date: 12/25/2023 CLINICAL DATA:  Right knee pain after injury. EXAM: RIGHT KNEE - COMPLETE 4+ VIEW COMPARISON:  12/01/2021 FINDINGS: No acute fracture or dislocation. Tricompartmental osteoarthritis with joint space narrowing and spurring. There is a large joint effusion but no evidence of lipohemarthrosis. The bones are subjectively under mineralized. Chondrocalcinosis. IMPRESSION: 1. Large joint effusion. 2. No evidence of fracture dislocation, however in the setting of joint effusion, consider further assessment with CT to assess for radiographically occult fracture. Electronically Signed   By: Narda Rutherford M.D.   On: 12/25/2023 15:39   DG Hip Unilat W or Wo Pelvis 2-3 Views Right Result Date: 12/25/2023 CLINICAL DATA:  Pain after fall. EXAM: DG HIP (WITH OR WITHOUT PELVIS) 2-3V RIGHT COMPARISON:  Hip radiograph 12/01/2021 FINDINGS: No acute fracture of the pelvis or right hip. No hip dislocation. Mild right hip osteoarthritis. Chronic calcification in the right hamstrings insertion about the ischium. Pubic rami are intact. Pubic symphysis and sacroiliac joints are congruent. The bones are subjectively under  mineralized. IMPRESSION: No acute fracture of the pelvis or right hip. Electronically Signed   By: Narda Rutherford M.D.   On: 12/25/2023 15:37   CT Head Wo Contrast Result Date: 12/25/2023 CLINICAL DATA:  Fall last night EXAM: CT HEAD WITHOUT CONTRAST CT CERVICAL SPINE WITHOUT CONTRAST TECHNIQUE: Multidetector CT imaging of the head and cervical spine was performed following the standard protocol without intravenous contrast. Multiplanar CT image reconstructions of the cervical spine were also generated. RADIATION DOSE REDUCTION: This exam was performed according to the departmental dose-optimization program which includes automated exposure control, adjustment of the mA and/or kV according to patient size and/or use of iterative reconstruction technique. COMPARISON:  12/01/2021 FINDINGS: CT HEAD FINDINGS Brain: No evidence of acute infarction, hemorrhage, hydrocephalus, extra-axial collection or mass lesion/mass effect. Mild periventricular white matter hypodensity. Nonacute lacunar infarction of the right basal ganglia. Vascular: No hyperdense vessel or unexpected calcification. Skull: Normal. Negative for fracture or focal lesion. Sinuses/Orbits: No acute finding. Other: None. CT CERVICAL SPINE FINDINGS Alignment: Degenerative straightening of the normal cervical lordosis. Skull base and vertebrae: No acute fracture. No primary bone lesion or focal pathologic process. Soft tissues and spinal canal: No prevertebral fluid or swelling. No visible canal hematoma. Disc levels: Moderate disc space height loss and osteophytosis, worst from C5-C7 Upper chest: Negative. Other: None. IMPRESSION: 1. No acute intracranial pathology. Small-vessel white matter disease and nonacute lacunar infarction of the right basal ganglia. 2. No fracture or static subluxation of the cervical spine. 3. Moderate cervical disc degenerative disease. Electronically Signed   By: Jearld Lesch M.D.   On: 12/25/2023 15:29   CT Cervical Spine  Wo Contrast Result Date: 12/25/2023 CLINICAL DATA:  Fall last night EXAM: CT HEAD WITHOUT CONTRAST CT CERVICAL SPINE WITHOUT CONTRAST TECHNIQUE: Multidetector CT imaging of the head and cervical spine was performed following the standard protocol without intravenous contrast. Multiplanar CT image reconstructions of the cervical spine were also generated. RADIATION DOSE REDUCTION: This exam was performed according to the departmental dose-optimization program which includes automated exposure control, adjustment of the mA and/or kV according to patient size and/or use of iterative reconstruction technique. COMPARISON:  12/01/2021 FINDINGS: CT HEAD FINDINGS Brain: No evidence of acute infarction, hemorrhage, hydrocephalus, extra-axial collection or mass lesion/mass effect. Mild periventricular white matter hypodensity. Nonacute lacunar infarction of the right basal ganglia. Vascular: No hyperdense vessel or unexpected calcification. Skull: Normal. Negative for fracture or focal lesion. Sinuses/Orbits: No acute finding. Other: None. CT CERVICAL SPINE FINDINGS Alignment: Degenerative straightening of the  normal cervical lordosis. Skull base and vertebrae: No acute fracture. No primary bone lesion or focal pathologic process. Soft tissues and spinal canal: No prevertebral fluid or swelling. No visible canal hematoma. Disc levels: Moderate disc space height loss and osteophytosis, worst from C5-C7 Upper chest: Negative. Other: None. IMPRESSION: 1. No acute intracranial pathology. Small-vessel white matter disease and nonacute lacunar infarction of the right basal ganglia. 2. No fracture or static subluxation of the cervical spine. 3. Moderate cervical disc degenerative disease. Electronically Signed   By: Jearld Lesch M.D.   On: 12/25/2023 15:29    Scheduled Meds:  ARIPiprazole  5 mg Oral Daily   bictegravir-emtricitabine-tenofovir AF  1 tablet Oral Daily   donepezil  5 mg Oral Daily   And   donepezil  10 mg Oral  QHS   enoxaparin (LOVENOX) injection  30 mg Subcutaneous Q24H   losartan  12.5 mg Oral Daily   mirabegron ER  50 mg Oral Daily   mirtazapine  15 mg Oral QHS   pantoprazole  40 mg Oral Daily   venlafaxine XR  75 mg Oral Daily   Continuous Infusions:  lactated ringers 75 mL/hr at 12/25/23 2047   piperacillin-tazobactam (ZOSYN)  IV 3.375 g (12/26/23 0526)     LOS: 1 day   Time spent: 40 minutes  Carollee Herter, DO  [facility]  12/26/2023, 9:34 AM

## 2023-12-26 NOTE — Plan of Care (Signed)
  Problem: Education: Goal: Knowledge of General Education information will improve Description: Including pain rating scale, medication(s)/side effects and non-pharmacologic comfort measures Outcome: Progressing   Problem: Clinical Measurements: Goal: Will remain free from infection Outcome: Progressing   Problem: Nutrition: Goal: Adequate nutrition will be maintained Outcome: Progressing   

## 2023-12-27 DIAGNOSIS — N3000 Acute cystitis without hematuria: Secondary | ICD-10-CM | POA: Diagnosis not present

## 2023-12-27 DIAGNOSIS — N1832 Chronic kidney disease, stage 3b: Secondary | ICD-10-CM | POA: Diagnosis not present

## 2023-12-27 DIAGNOSIS — S82121A Displaced fracture of lateral condyle of right tibia, initial encounter for closed fracture: Secondary | ICD-10-CM | POA: Diagnosis not present

## 2023-12-27 DIAGNOSIS — F03918 Unspecified dementia, unspecified severity, with other behavioral disturbance: Secondary | ICD-10-CM | POA: Diagnosis not present

## 2023-12-27 MED ORDER — LACTATED RINGERS IV BOLUS
500.0000 mL | Freq: Once | INTRAVENOUS | Status: AC
  Administered 2023-12-27: 500 mL via INTRAVENOUS

## 2023-12-27 MED ORDER — HYDROCODONE-ACETAMINOPHEN 5-325 MG PO TABS
1.0000 | ORAL_TABLET | Freq: Four times a day (QID) | ORAL | Status: DC | PRN
  Administered 2023-12-27 – 2023-12-28 (×3): 1 via ORAL
  Filled 2023-12-27 (×3): qty 1

## 2023-12-27 MED ORDER — LACTATED RINGERS IV SOLN: INTRAVENOUS | Status: AC

## 2023-12-27 NOTE — Progress Notes (Signed)
 Was sleepy this morning and moaned with right knee pain.  More alert this afternoon.  Knee immoblizer in place and velcro loosened to check skin and then secured again.  Edema to right foot, skin warm and dry.  Received norco twice today.

## 2023-12-27 NOTE — Progress Notes (Signed)
 PROGRESS NOTE    [name]  ZOX:[id number] DOB: 06/01/1941 DOA: 12/25/2023 PCP: Charlynne Pander, MD  Subjective: Pt seen and examined. Encephalopathic this AM. Urine in purewick container is very dark yellow. Pt is difficult to arouse.   Hospital Course: HPI: [name] is a 83 y.o. adult with medical history significant for COPD, dementia, depression, hypertension, schizophrenia, HIV, ischemic colitis, large bowel perforation colostomy status. Patient was brought to the ED from nursing home reports of a fall with subsequent swelling to the right knee, and pain to the right leg.  Per EDP, at baseline patient is nonambulatory.  On my evaluation, patient is sleeping, she is arousable, answers a few questions, knows she is in the hospital, answers a few questions but quickly drifts back to sleep.   ED Course: Febrile to 100.6.  Heart rate 70s.  Respiratory rate 13-20.  Blood pressure systolic 110-153.  O2 sats greater than 95% on room air. UA suggestive of UTI.  WBC 8.9. Imaging included right knee CT-findings suspicious for nondisplaced lateral tibial plateau fracture without significant articular depression and a large joint effusion. Right hip x-ray, lumbar and thoracic spine x-ray, right foot and right ankle pain, pelvic x-ray, head and cervical CT-all negative for acute abnormality. EDP talked to orthopedist-conservative management with knee immobilizer. IV ceftriaxone given. Hospitalist to admit for fall, right knee fracture, and UTI.  Significant Events: Admitted 12/25/2023 for closed fracture of right tibia plateau, acute cystitis   Significant Labs: WBC 8.7, Hgb 12.4, Plt 173 Na 139, K 3.6, CO2 of 22, BUN 23, Scr 1.4, glu 108  Significant Imaging Studies: CT head/c-spine No acute intracranial pathology. Small-vessel white matter disease and nonacute lacunar infarction of the right basal ganglia. 2. No fracture or static subluxation of the cervical spine. 3. Moderate  cervical disc degenerative disease. Right Knee XR shows Large joint effusion. 2. No evidence of fracture dislocation, however in the setting of joint effusion, consider further assessment with CT to assess for radiographically occult fracture. Pelvic XR shows No acute fracture of the pelvis or right hip.  Right Ankle/Foot XR shows No evidence of acute fracture of the right foot or ankle. 2. Hallux valgus and degenerative change of the first metatarsal phalangeal joint. Hammertoe deformity of the toes. T-spine XR shows No acute fracture of the thoracic spine. Mild degenerative change L-spine XR shows No evidence of lumbar spine fracture. Multilevel degenerative change. CT right hip/knee shows No evidence of hip fracture, allowing for motion limitations. Findings suspicious for nondisplaced lateral tibial plateau fracture without significant articular depression. Large joint effusion.  Antibiotic Therapy: Anti-infectives (From admission, onward)    Start     Dose/Rate Route Frequency Ordered Stop   12/26/23 1000  bictegravir-emtricitabine-tenofovir AF (BIKTARVY) 50-200-25 MG per tablet 1 tablet        1 tablet Oral Daily 12/26/23 0014     12/26/23 0600  piperacillin-tazobactam (ZOSYN) IVPB 3.375 g        3.375 g 12.5 mL/hr over 240 Minutes Intravenous Every 8 hours 12/25/23 2257     12/25/23 2230  piperacillin-tazobactam (ZOSYN) IVPB 3.375 g        3.375 g 100 mL/hr over 30 Minutes Intravenous  Once 12/25/23 2226 12/25/23 2258   12/25/23 1930  cefTRIAXone (ROCEPHIN) 1 g in sodium chloride 0.9 % 100 mL IVPB        1 g 200 mL/hr over 30 Minutes Intravenous  Once 12/25/23 1920 12/25/23 2046       Procedures:  Consultants:     Assessment and Plan: * Closed fracture of lateral portion of right tibial plateau On admission. As seen on CT of the right knee.  Status post fall.  Patient is nonambulatory at baseline.  Nursing home resident. Head and cervical CT including imaging of right  foot, ankle, pelvis, thoracic and lumbar spine all negative for acute abnormality. - EDP to orthopedist Dr. Ave Filter, recommended conservative management-no weightbearing, and knee immobilizer  -Hydrocodone as needed  12-26-2023 continue with knee immobilizer. Outpatient referral to Dr. Ave Filter at discharge.Marland Kitchen   12-27-2023 stable. Non-ambulatory at baseline. Continue knee immobilizer.  Acute cystitis without hematuria On admission. Presenting with fever of 100.6, diarrheal episodes in the ED.  Rules out for sepsis.  WBC 8.7.  UA suggestive of UTI with moderate leukocytes and many bacteria.  Stool C. difficile negative.  Chest x-ray without acute abnormality. -Recent urine cultures growing multidrug-resistant Proteus, Enterococcus. -Consulted pharm for assistance with choice of antibiotics-Zosyn only is okay.   -Check COVID, influenza and RSV-negative - Add-on urine cultures -Appears to be dehydrated, continue LR at 75 cc/h for 1 day.  12-26-2023 awaiting urine cultures. Continue IV zosyn for now.  12-27-2023 GNR growing from urine cx. No final yet. Continue zosyn. Start IVF. Urine is very concentrated in purewick container.  DNR (do not resuscitate)/DNI(Do Not Intubate) 12-26-2023 pt is DNR/DNI   Dementia with behavioral disturbance (HCC) On admission.  Stable. -Resume venlafaxine, donepezil  12-26-2023 stable. 12-27-2023 stable. Chronic.  HIV disease (HCC) On admission. 05/2023- Last HIV viral load not detected.  CD4 helper cells 506. -Resume Biktarvy  12-26-2023 follows with outpatient ID clinic. 12-27-2023 stable. Chronic.  History of schizophrenia 12-26-2023 stable. 12-27-2023 stable. Chronic.  CKD stage 3b, GFR 30-44 ml/min (HCC) - baseline SCr 1.2-1.6 On admission. Creatinine 1.4.  Stable.  12-26-2023 stable. Chronic.  12-27-2023 start IVF today for poor po intake.  Essential hypertension On admission. Stable. -Resume losartan  12-26-2023 stable. Chronic. On losartan  12.5 mg daily.  12-27-2023 hold losartan due to poor po intake. Didn't stop it in time for today. Already got today's dose. start IVF today for poor po intake.  DVT prophylaxis: enoxaparin (LOVENOX) injection 30 mg Start: 12/26/23 1000    Code Status: Limited: Do not attempt resuscitation (DNR) -DNR-LIMITED -Do Not Intubate/DNI  Family Communication: no family at bedside Disposition Plan: return to SNF Reason for continuing need for hospitalization: remains on IV abx. Started IVF due to poor po intake.  Objective: Vitals:   12/26/23 0542 12/26/23 1417 12/26/23 2021 12/27/23 0517  BP: (!) 124/57 103/60 120/63 121/69  Pulse: 73 75 73 68  Resp: 18  16 16   Temp: 98.2 F (36.8 C) 98.2 F (36.8 C) 98.8 F (37.1 C) 98.3 F (36.8 C)  TempSrc: Oral  Oral Oral  SpO2: 98% 100% 100% 97%  Weight:      Height:        Intake/Output Summary (Last 24 hours) at 12/27/2023 1025 Last data filed at 12/27/2023 0517 Gross per 24 hour  Intake 550 ml  Output 400 ml  Net 150 ml   Filed Weights   12/25/23 1405  Weight: 59 kg   Examination:  Physical Exam Vitals and nursing note reviewed.  Constitutional:      Comments: Encephalopathic today.  Cardiovascular:     Rate and Rhythm: Normal rate and regular rhythm.  Pulmonary:     Effort: Pulmonary effort is normal.     Breath sounds: Normal breath sounds.  Abdominal:  General: Bowel sounds are normal.     Palpations: Abdomen is soft.  Musculoskeletal:     Comments: Right knee in immobilizer  Skin:    General: Skin is warm and dry.     Capillary Refill: Capillary refill takes less than 2 seconds.  Neurological:     Comments: Unarousable with verbal stimuli. Moans with physical stimuli. Did not open her eyes.     Data Reviewed: I have personally reviewed following labs and imaging studies  CBC: Recent Labs  Lab 12/25/23 1630  WBC 8.7  NEUTROABS 5.4  HGB 12.4  HCT 36.2  MCV 93.5  PLT 173   Basic Metabolic Panel: Recent Labs   Lab 12/25/23 1630  NA 139  K 3.6  CL 105  CO2 22  GLUCOSE 108*  BUN 23  CREATININE 1.40*  CALCIUM 10.3   GFR: Estimated Creatinine Clearance: 28.9 mL/min (A) (by C-G formula based on SCr of 1.4 mg/dL (H)). Liver Function Tests: Recent Labs  Lab 12/25/23 1630  AST 26  ALT 17  ALKPHOS 96  BILITOT 1.4*  PROT 7.2  ALBUMIN 3.9   Sepsis Labs: Recent Labs  Lab 12/25/23 2002  LATICACIDVEN 0.7    Recent Results (from the past 240 hours)  C Difficile Quick Screen w PCR reflex     Status: None   Collection Time: 12/25/23  4:06 PM   Specimen: Stool  Result Value Ref Range Status   C Diff antigen NEGATIVE NEGATIVE Final   C Diff toxin NEGATIVE NEGATIVE Final   C Diff interpretation No C. difficile detected.  Final    Comment: Performed at [facility], [address], [place], [place] [address]  Urine Culture (for pregnant, neutropenic or urologic patients or patients with an indwelling urinary catheter)     Status: Abnormal (Preliminary result)   Collection Time: 12/25/23  5:37 PM   Specimen: Urine, Clean Catch  Result Value Ref Range Status   Specimen Description   Final    URINE, CLEAN CATCH Performed at [facility], [address] [place], [place] [address]    Special Requests   Final    NONE Performed at [facility], [address] [place], [place] [address]    Culture (A)  Final    >=100,000 COLONIES/mL GRAM NEGATIVE RODS SUSCEPTIBILITIES TO FOLLOW Performed at [facility], [address] [place], [place] [address]    Report Status PENDING  Incomplete  Culture, blood (routine x 2)     Status: None (Preliminary result)   Collection Time: 12/25/23  8:02 PM   Specimen: BLOOD RIGHT ARM  Result Value Ref Range Status   Specimen Description BLOOD RIGHT ARM  Final   Special Requests   Final    BOTTLES DRAWN AEROBIC AND ANAEROBIC Blood Culture adequate volume   Culture   Final    NO GROWTH 2 DAYS Performed at [facility], [address]  [place], [place] [address]    Report Status PENDING  Incomplete  Culture, blood (routine x 2)     Status: None (Preliminary result)   Collection Time: 12/25/23  8:16 PM   Specimen: Left Antecubital; Blood  Result Value Ref Range Status   Specimen Description LEFT ANTECUBITAL  Final   Special Requests   Final    BOTTLES DRAWN AEROBIC AND ANAEROBIC Blood Culture adequate volume   Culture   Final    NO GROWTH 2 DAYS Performed at [facility], [address] [place], [place] [address]  Report Status PENDING  Incomplete  Resp panel by RT-PCR (RSV, Flu A&B, Covid) Anterior Nasal Swab     Status: None   Collection Time: 12/25/23  8:35 PM   Specimen: Anterior Nasal Swab  Result Value Ref Range Status   SARS Coronavirus 2 by RT PCR NEGATIVE NEGATIVE Final    Comment: (NOTE) SARS-CoV-2 target nucleic acids are NOT DETECTED.  The SARS-CoV-2 RNA is generally detectable in upper respiratory specimens during the acute phase of infection. The lowest concentration of SARS-CoV-2 viral copies this assay can detect is 138 copies/mL. A negative result does not preclude SARS-Cov-2 infection and should not be used as the sole basis for treatment or other patient management decisions. A negative result may occur with  improper specimen collection/handling, submission of specimen other than nasopharyngeal swab, presence of viral mutation(s) within the areas targeted by this assay, and inadequate number of viral copies(<138 copies/mL). A negative result must be combined with clinical observations, patient history, and epidemiological information. The expected result is Negative.  Fact Sheet for Patients:  BloggerCourse.com  Fact Sheet for Healthcare Providers:  SeriousBroker.it  This test is no t yet approved or cleared by the [place] FDA and  has been authorized for detection and/or diagnosis of SARS-CoV-2 by FDA under an Emergency Use  Authorization (EUA). This EUA will remain  in effect (meaning this test can be used) for the duration of the COVID-19 declaration under Section 564(b)(1) of the Act, 21 U.S.C.section 360bbb-3(b)(1), unless the authorization is terminated  or revoked sooner.       Influenza A by PCR NEGATIVE NEGATIVE Final   Influenza B by PCR NEGATIVE NEGATIVE Final    Comment: (NOTE) The Xpert Xpress SARS-CoV-2/FLU/RSV plus assay is intended as an aid in the diagnosis of influenza from Nasopharyngeal swab specimens and should not be used as a sole basis for treatment. Nasal washings and aspirates are unacceptable for Xpert Xpress SARS-CoV-2/FLU/RSV testing.  Fact Sheet for Patients: BloggerCourse.com  Fact Sheet for Healthcare Providers: SeriousBroker.it  This test is not yet approved or cleared by the [place] FDA and has been authorized for detection and/or diagnosis of SARS-CoV-2 by FDA under an Emergency Use Authorization (EUA). This EUA will remain in effect (meaning this test can be used) for the duration of the COVID-19 declaration under Section 564(b)(1) of the Act, 21 U.S.C. section 360bbb-3(b)(1), unless the authorization is terminated or revoked.     Resp Syncytial Virus by PCR NEGATIVE NEGATIVE Final    Comment: (NOTE) Fact Sheet for Patients: BloggerCourse.com  Fact Sheet for Healthcare Providers: SeriousBroker.it  This test is not yet approved or cleared by the [place] FDA and has been authorized for detection and/or diagnosis of SARS-CoV-2 by FDA under an Emergency Use Authorization (EUA). This EUA will remain in effect (meaning this test can be used) for the duration of the COVID-19 declaration under Section 564(b)(1) of the Act, 21 U.S.C. section 360bbb-3(b)(1), unless the authorization is terminated or revoked.  Performed at [facility], [address],  [place], [place] [address]      Radiology Studies: CT Knee Right Wo Contrast Result Date: 12/25/2023 CLINICAL DATA:  Knee trauma, internal derangement suspected, xray done EXAM: CT OF THE RIGHT KNEE WITHOUT CONTRAST TECHNIQUE: Multidetector CT imaging of the right knee was performed according to the standard protocol. Multiplanar CT image reconstructions were also generated. RADIATION DOSE REDUCTION: This exam was performed according to the departmental dose-optimization program which includes automated exposure control, adjustment of the mA  and/or kV according to patient size and/or use of iterative reconstruction technique. COMPARISON:  Radiograph earlier today FINDINGS: Bones/Joint/Cartilage The bones are markedly under mineralized. There is irregularity about the lateral tibial plateau without significant articular depression, sagittal series 9, image 73 and coronal series 6, image 96 and may represent an impaction fracture. No other fracture. Mild osteoarthritis with spurring. Large joint effusion with layering. Small Baker cyst. Chondrocalcinosis. Ligaments Suboptimally assessed by CT. Muscles and Tendons No intramuscular hematoma. Soft tissues Mild soft tissue edema. IMPRESSION: Findings suspicious for nondisplaced lateral tibial plateau fracture without significant articular depression. Large joint effusion. Marked osteoporosis limiting assessment. Electronically Signed   By: Narda Rutherford M.D.   On: 12/25/2023 17:26   CT Hip Right Wo Contrast Result Date: 12/25/2023 CLINICAL DATA:  Hip trauma, fracture suspected, xray done EXAM: CT OF THE RIGHT HIP WITHOUT CONTRAST TECHNIQUE: Multidetector CT imaging of the right hip was performed according to the standard protocol. Multiplanar CT image reconstructions were also generated. RADIATION DOSE REDUCTION: This exam was performed according to the departmental dose-optimization program which includes automated exposure control, adjustment of the mA and/or kV  according to patient size and/or use of iterative reconstruction technique. COMPARISON:  Radiograph earlier today, abdominopelvic CT 09/16/2023 FINDINGS: Motion artifact limitations. Bones/Joint/Cartilage Allowing for motion, no evidence of acute fracture. Included pelvis is intact, including pubic rami. Acetabular spurring which is suboptimally assessed due to motion. No gross joint effusion. Ligaments Suboptimally assessed by CT. Muscles and Tendons Gluteal muscle atrophy.  No intramuscular hematoma. Soft tissues No acute findings. Arterial vascular calcifications. Large amount of stool in the included colon. IMPRESSION: No evidence of hip fracture, allowing for motion limitations. Electronically Signed   By: Narda Rutherford M.D.   On: 12/25/2023 17:21   DG Thoracic Spine 2 View Result Date: 12/25/2023 CLINICAL DATA:  Pain after fall. EXAM: THORACIC SPINE 2 VIEWS COMPARISON:  None Available. FINDINGS: Normal alignment. No evidence of acute fracture or compression deformity. Mild mid lower thoracic degenerative disc disease. No paravertebral soft tissue abnormality to suggest fracture. IMPRESSION: No acute fracture of the thoracic spine. Mild degenerative change. Electronically Signed   By: Narda Rutherford M.D.   On: 12/25/2023 15:42   DG Lumbar Spine Complete Result Date: 12/25/2023 CLINICAL DATA:  Pain after fall. EXAM: LUMBAR SPINE - COMPLETE 4+ VIEW COMPARISON:  None Available. FINDINGS: Five non-rib-bearing lumbar vertebra. Normal alignment. No evidence of fracture or compression deformity. Posterior elements are suboptimally assessed due to bony under mineralization and overlying bowel gas. Degenerative disc disease with disc space narrowing and spurring L2-L3 and L3-L4. Scattered facet hypertrophy. IMPRESSION: No evidence of lumbar spine fracture. Multilevel degenerative change. Electronically Signed   By: Narda Rutherford M.D.   On: 12/25/2023 15:42   DG Ankle Complete Right Result Date:  12/25/2023 CLINICAL DATA:  Pain after injury. EXAM: RIGHT FOOT COMPLETE - 3+ VIEW; RIGHT ANKLE - COMPLETE 3+ VIEW COMPARISON:  None Available. FINDINGS: Ankle: The bones are subjectively under mineralized which limits assessment. No evidence of acute fracture. The ankle mortise is preserved. No definite focal soft tissue abnormality. Foot: No evidence of acute fracture. Moderate hallux valgus and degenerative change of the first metatarsal phalangeal joint. Hammertoe deformity of the toes which further limits digit assessment. Minor talonavicular degenerative spurring. Question of mild generalized subcutaneous edema. IMPRESSION: 1. No evidence of acute fracture of the right foot or ankle. 2. Hallux valgus and degenerative change of the first metatarsal phalangeal joint. Hammertoe deformity of the toes. Electronically Signed  By: Narda Rutherford M.D.   On: 12/25/2023 15:40   DG Foot Complete Right Result Date: 12/25/2023 CLINICAL DATA:  Pain after injury. EXAM: RIGHT FOOT COMPLETE - 3+ VIEW; RIGHT ANKLE - COMPLETE 3+ VIEW COMPARISON:  None Available. FINDINGS: Ankle: The bones are subjectively under mineralized which limits assessment. No evidence of acute fracture. The ankle mortise is preserved. No definite focal soft tissue abnormality. Foot: No evidence of acute fracture. Moderate hallux valgus and degenerative change of the first metatarsal phalangeal joint. Hammertoe deformity of the toes which further limits digit assessment. Minor talonavicular degenerative spurring. Question of mild generalized subcutaneous edema. IMPRESSION: 1. No evidence of acute fracture of the right foot or ankle. 2. Hallux valgus and degenerative change of the first metatarsal phalangeal joint. Hammertoe deformity of the toes. Electronically Signed   By: Narda Rutherford M.D.   On: 12/25/2023 15:40   DG Knee Complete 4 Views Right Result Date: 12/25/2023 CLINICAL DATA:  Right knee pain after injury. EXAM: RIGHT KNEE - COMPLETE 4+  VIEW COMPARISON:  12/01/2021 FINDINGS: No acute fracture or dislocation. Tricompartmental osteoarthritis with joint space narrowing and spurring. There is a large joint effusion but no evidence of lipohemarthrosis. The bones are subjectively under mineralized. Chondrocalcinosis. IMPRESSION: 1. Large joint effusion. 2. No evidence of fracture dislocation, however in the setting of joint effusion, consider further assessment with CT to assess for radiographically occult fracture. Electronically Signed   By: Narda Rutherford M.D.   On: 12/25/2023 15:39   DG Hip Unilat W or Wo Pelvis 2-3 Views Right Result Date: 12/25/2023 CLINICAL DATA:  Pain after fall. EXAM: DG HIP (WITH OR WITHOUT PELVIS) 2-3V RIGHT COMPARISON:  Hip radiograph 12/01/2021 FINDINGS: No acute fracture of the pelvis or right hip. No hip dislocation. Mild right hip osteoarthritis. Chronic calcification in the right hamstrings insertion about the ischium. Pubic rami are intact. Pubic symphysis and sacroiliac joints are congruent. The bones are subjectively under mineralized. IMPRESSION: No acute fracture of the pelvis or right hip. Electronically Signed   By: Narda Rutherford M.D.   On: 12/25/2023 15:37   CT Head Wo Contrast Result Date: 12/25/2023 CLINICAL DATA:  Fall last night EXAM: CT HEAD WITHOUT CONTRAST CT CERVICAL SPINE WITHOUT CONTRAST TECHNIQUE: Multidetector CT imaging of the head and cervical spine was performed following the standard protocol without intravenous contrast. Multiplanar CT image reconstructions of the cervical spine were also generated. RADIATION DOSE REDUCTION: This exam was performed according to the departmental dose-optimization program which includes automated exposure control, adjustment of the mA and/or kV according to patient size and/or use of iterative reconstruction technique. COMPARISON:  12/01/2021 FINDINGS: CT HEAD FINDINGS Brain: No evidence of acute infarction, hemorrhage, hydrocephalus, extra-axial  collection or mass lesion/mass effect. Mild periventricular white matter hypodensity. Nonacute lacunar infarction of the right basal ganglia. Vascular: No hyperdense vessel or unexpected calcification. Skull: Normal. Negative for fracture or focal lesion. Sinuses/Orbits: No acute finding. Other: None. CT CERVICAL SPINE FINDINGS Alignment: Degenerative straightening of the normal cervical lordosis. Skull base and vertebrae: No acute fracture. No primary bone lesion or focal pathologic process. Soft tissues and spinal canal: No prevertebral fluid or swelling. No visible canal hematoma. Disc levels: Moderate disc space height loss and osteophytosis, worst from C5-C7 Upper chest: Negative. Other: None. IMPRESSION: 1. No acute intracranial pathology. Small-vessel white matter disease and nonacute lacunar infarction of the right basal ganglia. 2. No fracture or static subluxation of the cervical spine. 3. Moderate cervical disc degenerative disease. Electronically Signed  By: Jearld Lesch M.D.   On: 12/25/2023 15:29   CT Cervical Spine Wo Contrast Result Date: 12/25/2023 CLINICAL DATA:  Fall last night EXAM: CT HEAD WITHOUT CONTRAST CT CERVICAL SPINE WITHOUT CONTRAST TECHNIQUE: Multidetector CT imaging of the head and cervical spine was performed following the standard protocol without intravenous contrast. Multiplanar CT image reconstructions of the cervical spine were also generated. RADIATION DOSE REDUCTION: This exam was performed according to the departmental dose-optimization program which includes automated exposure control, adjustment of the mA and/or kV according to patient size and/or use of iterative reconstruction technique. COMPARISON:  12/01/2021 FINDINGS: CT HEAD FINDINGS Brain: No evidence of acute infarction, hemorrhage, hydrocephalus, extra-axial collection or mass lesion/mass effect. Mild periventricular white matter hypodensity. Nonacute lacunar infarction of the right basal ganglia. Vascular: No  hyperdense vessel or unexpected calcification. Skull: Normal. Negative for fracture or focal lesion. Sinuses/Orbits: No acute finding. Other: None. CT CERVICAL SPINE FINDINGS Alignment: Degenerative straightening of the normal cervical lordosis. Skull base and vertebrae: No acute fracture. No primary bone lesion or focal pathologic process. Soft tissues and spinal canal: No prevertebral fluid or swelling. No visible canal hematoma. Disc levels: Moderate disc space height loss and osteophytosis, worst from C5-C7 Upper chest: Negative. Other: None. IMPRESSION: 1. No acute intracranial pathology. Small-vessel white matter disease and nonacute lacunar infarction of the right basal ganglia. 2. No fracture or static subluxation of the cervical spine. 3. Moderate cervical disc degenerative disease. Electronically Signed   By: Jearld Lesch M.D.   On: 12/25/2023 15:29    Scheduled Meds:  ARIPiprazole  5 mg Oral Daily   bictegravir-emtricitabine-tenofovir AF  1 tablet Oral Daily   donepezil  5 mg Oral Daily   And   donepezil  10 mg Oral QHS   enoxaparin (LOVENOX) injection  30 mg Subcutaneous Q24H   mirabegron ER  50 mg Oral Daily   mirtazapine  15 mg Oral QHS   pantoprazole  40 mg Oral Daily   venlafaxine XR  75 mg Oral Daily   Continuous Infusions:  lactated ringers     piperacillin-tazobactam (ZOSYN)  IV 3.375 g (12/27/23 0535)     LOS: 2 days   Time spent: 45 minutes  Carollee Herter, DO  [facility]  12/27/2023, 10:25 AM

## 2023-12-27 NOTE — [place] FL2 (Signed)
 [place] MEDICAID FL2 LEVEL OF CARE FORM     IDENTIFICATION  Patient Name: [name] Birthdate: Oct 25, 1941 Sex: female Admission Date (Current Location): 12/25/2023  [facility] and [place] Number:  [employer] and Address:  [facility],  [address] [place] [address]      Provider Number: [id number]  Attending Physician Name and Address:  Carollee Herter, DO  Relative Name and Phone Number:  Curt Bears [facility]) [phone]    Current Level of Care: Hospital Recommended Level of Care: Skilled Nursing Facility Prior Approval Number:    Date Approved/Denied:   PASRR Number: [id number]  Discharge Plan: SNF    Current Diagnoses: Patient Active Problem List   Diagnosis Date Noted   DNR (do not resuscitate)/DNI(Do Not Intubate) 12/26/2023   Closed fracture of lateral portion of right tibial plateau 12/25/2023   Dementia with behavioral disturbance (HCC) 05/31/2023   Pelvic pain 05/25/2022   HIV infection with neurological disease (HCC) 05/25/2022   HIV disease (HCC) 01/26/2022   Incomplete bladder emptying 01/20/2022   Acute cystitis without hematuria 01/05/2022   History of schizophrenia 06/21/2021   Generalized weakness 05/10/2021   Moderate single current episode of major depressive disorder (HCC) 03/12/2021   Incontinence of urine in female 11/21/2020   Mass of left side of neck 09/12/2020   Abdominal wall bulge 04/22/2020   History of colonic polyps 07/06/2019   History of partial colectomy 07/06/2019   Seasonal allergies 03/27/2019   Constipation due to opioid therapy 11/12/2018   COPD (chronic obstructive pulmonary disease) (HCC) 12/17/2017   Hypothyroidism 12/17/2017   Weight loss 03/03/2017   CKD stage 3b, GFR 30-44 ml/min (HCC) - baseline SCr 1.2-1.6 05/04/2016   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   Cervical spondylosis 08/15/2013   Neck pain on left side 05/24/2013   Vitamin D deficiency 12/14/2012   Hemorrhoid 09/01/2012    Essential hypertension 11/25/2011   Cramps, muscle, general 08/18/2011   Esophageal dysphagia 11/03/2010   DEPRESSION 09/04/2008   HLD (hyperlipidemia) 05/22/2008   IRRITABLE BOWEL SYNDROME 05/22/2008   HERNIATED CERVICAL DISC 10/05/2007   HERNIATED LUMBAR DISC 10/05/2007   History of urinary tract infection 09/20/2007   HIV-1 associated autonomic neuropathy (HCC) 03/16/2007   HIV (human immunodeficiency virus infection) (HCC) 08/27/2006    Orientation RESPIRATION BLADDER Height & Weight     Self  Normal External catheter Weight: 130 lb 1.1 oz (59 kg) Height:  5\' 7"  (170.2 cm)  BEHAVIORAL SYMPTOMS/MOOD NEUROLOGICAL BOWEL NUTRITION STATUS      Incontinent Diet (See DC summary)  AMBULATORY STATUS COMMUNICATION OF NEEDS Skin   Total Care Verbally Normal                       Personal Care Assistance Level of Assistance  Bathing, Feeding, Total care, Dressing Bathing Assistance: Maximum assistance Feeding assistance: Maximum assistance Dressing Assistance: Maximum assistance Total Care Assistance: Maximum assistance   Functional Limitations Info  Sight, Hearing, Speech Sight Info: Adequate Hearing Info: Adequate Speech Info: Adequate    SPECIAL CARE FACTORS FREQUENCY                       Contractures Contractures Info: Not present    Additional Factors Info  Code Status, Allergies Code Status Info: DNR - Limited Allergies Info: Bee Venom , Promethazine, Tenofovir, Fumarate, Disoproxil, Compazine, Haloperidol Lactate, Sulfa antibiotics, Brompheniramine-acetaminophen, Chlorcyclizine, Chlorpromzine, Codeine, Navane, Prednisone, Promethazine Hcl, Propoxyphene N- acetaminopen, Morphine  Current Medications (12/27/2023):  This is the current hospital active medication list Current Facility-Administered Medications  Medication Dose Route Frequency Provider Last Rate Last Admin   ARIPiprazole (ABILIFY) tablet 5 mg  5 mg Oral Daily Emokpae, Ejiroghene E,  MD   5 mg at 12/27/23 1015   bictegravir-emtricitabine-tenofovir AF (BIKTARVY) 50-200-25 MG per tablet 1 tablet  1 tablet Oral Daily Emokpae, Ejiroghene E, MD   1 tablet at 12/27/23 1149   donepezil (ARICEPT) tablet 5 mg  5 mg Oral Daily Emokpae, Ejiroghene E, MD   5 mg at 12/27/23 1014   And   donepezil (ARICEPT) tablet 10 mg  10 mg Oral QHS Emokpae, Ejiroghene E, MD   10 mg at 12/26/23 2020   enoxaparin (LOVENOX) injection 30 mg  30 mg Subcutaneous Q24H Emokpae, Ejiroghene E, MD   30 mg at 12/27/23 1013   HYDROcodone-acetaminophen (NORCO/VICODIN) 5-325 MG per tablet 1 tablet  1 tablet Oral Q6H PRN Carollee Herter, DO   1 tablet at 12/27/23 1027   ipratropium-albuterol (DUONEB) 0.5-2.5 (3) MG/3ML nebulizer solution 3 mL  3 mL Inhalation Q6H PRN Emokpae, Ejiroghene E, MD       lactated ringers infusion   Intravenous Continuous Carollee Herter, DO 100 mL/hr at 12/27/23 1146 New Bag at 12/27/23 1146   mirabegron ER (MYRBETRIQ) tablet 50 mg  50 mg Oral Daily Emokpae, Ejiroghene E, MD   50 mg at 12/27/23 1013   mirtazapine (REMERON) tablet 15 mg  15 mg Oral QHS Emokpae, Ejiroghene E, MD   15 mg at 12/26/23 2020   ondansetron (ZOFRAN) tablet 4 mg  4 mg Oral Q6H PRN Emokpae, Ejiroghene E, MD       Or   ondansetron (ZOFRAN) injection 4 mg  4 mg Intravenous Q6H PRN Emokpae, Ejiroghene E, MD       pantoprazole (PROTONIX) EC tablet 40 mg  40 mg Oral Daily Orson Aloe, RPH   40 mg at 12/27/23 1014   piperacillin-tazobactam (ZOSYN) IVPB 3.375 g  3.375 g Intravenous Q8H Arabella Merles, RPH 12.5 mL/hr at 12/27/23 0535 3.375 g at 12/27/23 0535   venlafaxine XR (EFFEXOR-XR) 24 hr capsule 75 mg  75 mg Oral Daily Emokpae, Ejiroghene E, MD   75 mg at 12/27/23 1013     Discharge Medications: Please see discharge summary for a list of discharge medications.  Relevant Imaging Results:  Relevant Lab Results:   Additional Information Pt SSN: [id number]  Isabella Bowens, LCSWA

## 2023-12-27 NOTE — TOC Initial Note (Signed)
 Transition of Care [facility]) - Initial/Assessment Note    Patient Details  Name: [name] MRN: [id number] Date of Birth: [birth year]  [facility]) CM/SW Contact:    Beather Arbour Phone Number: 12/27/2023, 1:54 PM  Clinical Narrative:                 CSW spoke with Fleet Contras who is POA after speaking with Debbie at [facility]. Patient has been a LT resident at [facility] for 4/5 years. Daughter in law requested for pt information to be sent to [facility] since it is closer for them. CSW shared that normally the facility would work on placement to another SNF once they return back. Pt is bed bound and is total care and has been at [facility] fro 4/5. Daughter n law has already contacted [facility] and they have an open bed. CSW sent referral over. TOC will follow.   Expected Discharge Plan: Skilled Nursing Facility Barriers to Discharge: Continued Medical Work up   Patient Goals and CMS Choice Patient states their goals for this hospitalization and ongoing recovery are:: DC to another SNF CMS Medicare.gov Compare Post Acute Care list provided to:: Patient Represenative (must comment) Curt Bears - Daughter-n-law/ POA) Choice offered to / list presented to : [facility] POA / Guardian Curt Bears) [place] ownership interest in [facility].provided to:: [facility] POA / Guardian    Expected Discharge Plan and Services In-house Referral: Clinical Social Work   Post Acute Care Choice: Skilled Nursing Facility Living arrangements for the past 2 months: Skilled Nursing Facility                                      Prior Living Arrangements/Services Living arrangements for the past 2 months: Skilled Nursing Facility Lives with:: Facility Resident Patient language and need for interpreter reviewed:: Yes Do you feel safe going back to the place where you live?: No   daughter-n-law wants another facility - [employer]  Need for Family Participation in Patient Care: Yes  (Comment) Care giver support system in place?: Yes (comment) Current home services: DME Criminal Activity/Legal Involvement Pertinent to Current Situation/Hospitalization: No - Comment as needed  Activities of Daily Living   ADL Screening (condition at time of admission) Independently performs ADLs?: No Does the patient have a NEW difficulty with bathing/dressing/toileting/self-feeding that is expected to last >3 days?: Yes (Initiates electronic notice to provider for possible OT consult) Does the patient have a NEW difficulty with getting in/out of bed, walking, or climbing stairs that is expected to last >3 days?: Yes (Initiates electronic notice to provider for possible PT consult) Does the patient have a NEW difficulty with communication that is expected to last >3 days?: No Is the patient deaf or have difficulty hearing?: No Does the patient have difficulty seeing, even when wearing glasses/contacts?: No Does the patient have difficulty concentrating, remembering, or making decisions?: Yes  Permission Sought/Granted      Share Information with NAME: Fleet Contras     Permission granted to share info w Relationship: Daughter-n-law / POA     Emotional Assessment Appearance:: Appears stated age Attitude/Demeanor/Rapport: Engaged Affect (typically observed): Accepting Orientation: : Oriented to Self Alcohol / Substance Use: Not Applicable Psych Involvement: No (comment)  Admission diagnosis:  Closed fracture of lateral portion of right tibial plateau [S82.121A] Closed fracture of right tibial plateau, initial encounter [S82.141A] Fever, unspecified fever cause [  R50.9] Urinary tract infection without hematuria, site unspecified [N39.0] Patient Active Problem List   Diagnosis Date Noted   DNR (do not resuscitate)/DNI(Do Not Intubate) 12/26/2023   Closed fracture of lateral portion of right tibial plateau 12/25/2023   Dementia with behavioral disturbance (HCC) 05/31/2023   Pelvic pain  05/25/2022   HIV infection with neurological disease (HCC) 05/25/2022   HIV disease (HCC) 01/26/2022   Incomplete bladder emptying 01/20/2022   Acute cystitis without hematuria 01/05/2022   History of schizophrenia 06/21/2021   Generalized weakness 05/10/2021   Moderate single current episode of major depressive disorder (HCC) 03/12/2021   Incontinence of urine in female 11/21/2020   Mass of left side of neck 09/12/2020   Abdominal wall bulge 04/22/2020   History of colonic polyps 07/06/2019   History of partial colectomy 07/06/2019   Seasonal allergies 03/27/2019   Constipation due to opioid therapy 11/12/2018   COPD (chronic obstructive pulmonary disease) (HCC) 12/17/2017   Hypothyroidism 12/17/2017   Weight loss 03/03/2017   CKD stage 3b, GFR 30-44 ml/min (HCC) - baseline SCr 1.2-1.6 05/04/2016   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   Cervical spondylosis 08/15/2013   Neck pain on left side 05/24/2013   Vitamin D deficiency 12/14/2012   Hemorrhoid 09/01/2012   Essential hypertension 11/25/2011   Cramps, muscle, general 08/18/2011   Esophageal dysphagia 11/03/2010   DEPRESSION 09/04/2008   HLD (hyperlipidemia) 05/22/2008   IRRITABLE BOWEL SYNDROME 05/22/2008   HERNIATED CERVICAL DISC 10/05/2007   HERNIATED LUMBAR DISC 10/05/2007   History of urinary tract infection 09/20/2007   HIV-1 associated autonomic neuropathy (HCC) 03/16/2007   HIV (human immunodeficiency virus infection) (HCC) 08/27/2006   PCP:  Charlynne Pander, MD Pharmacy:   [facility] - [place], [place] - [address] [place] [address] Phone: [phone] Fax: [phone]     Social Drivers of Health (SDOH) Social History: SDOH Screenings   Food Insecurity: No Food Insecurity (12/25/2023)  Housing: Patient Unable To Answer (12/25/2023)  Transportation Needs: Patient Unable To Answer (12/25/2023)  Utilities: Patient Unable To Answer (12/25/2023)  Depression (PHQ2-9): Low Risk   (01/05/2022)  Financial Resource Strain: Low Risk  (12/27/2020)   Received from [facility]  Social Connections: Patient Unable To Answer (12/26/2023)  Stress: Stress Concern Present (12/27/2020)   Received from [facility]  Tobacco Use: Low Risk  (08/09/2023)   SDOH Interventions:     Readmission Risk Interventions    12/27/2023    1:52 PM 06/23/2021   11:18 AM 05/11/2021    3:21 PM  Readmission Risk Prevention Plan  Medication Screening Complete    Transportation Screening Complete Complete Complete  PCP or Specialist Appt within 3-5 Days   Complete  HRI or Home Care Consult  Complete Complete  Social Work Consult for Recovery Care Planning/Counseling  Complete Complete  Palliative Care Screening  Not Applicable Not Applicable  Medication Review [employer])  Complete Complete

## 2023-12-27 NOTE — Plan of Care (Signed)
   Problem: Education: Goal: Knowledge of General Education information will improve Description: Including pain rating scale, medication(s)/side effects and non-pharmacologic comfort measures Outcome: Progressing   Problem: Nutrition: Goal: Adequate nutrition will be maintained Outcome: Progressing   Problem: Coping: Goal: Level of anxiety will decrease Outcome: Progressing   Problem: Elimination: Goal: Will not experience complications related to bowel motility Outcome: Progressing Goal: Will not experience complications related to urinary retention Outcome: Progressing   Problem: Pain Managment: Goal: General experience of comfort will improve and/or be controlled Outcome: Progressing   Problem: Safety: Goal: Ability to remain free from injury will improve Outcome: Progressing

## 2023-12-27 NOTE — Care Management Important Message (Signed)
 Important Message  Patient Details  Name: [name] MRN: [id number] Date of Birth: [birth year]   Important Message Given:  N/A - LOS <3 / Initial given by admissions     Corey Harold 12/27/2023, 10:26 AM

## 2023-12-28 DIAGNOSIS — A498 Other bacterial infections of unspecified site: Secondary | ICD-10-CM

## 2023-12-28 DIAGNOSIS — Z8659 Personal history of other mental and behavioral disorders: Secondary | ICD-10-CM

## 2023-12-28 DIAGNOSIS — F03918 Unspecified dementia, unspecified severity, with other behavioral disturbance: Secondary | ICD-10-CM | POA: Diagnosis not present

## 2023-12-28 DIAGNOSIS — Z1612 Extended spectrum beta lactamase (ESBL) resistance: Secondary | ICD-10-CM

## 2023-12-28 DIAGNOSIS — S82121A Displaced fracture of lateral condyle of right tibia, initial encounter for closed fracture: Secondary | ICD-10-CM | POA: Diagnosis not present

## 2023-12-28 DIAGNOSIS — N1832 Chronic kidney disease, stage 3b: Secondary | ICD-10-CM | POA: Diagnosis not present

## 2023-12-28 LAB — COMPREHENSIVE METABOLIC PANEL
ALT: 12 U/L (ref 0–44)
AST: 15 U/L (ref 15–41)
Albumin: 3.3 g/dL — ABNORMAL LOW (ref 3.5–5.0)
Alkaline Phosphatase: 64 U/L (ref 38–126)
Anion gap: 5 (ref 5–15)
BUN: 16 mg/dL (ref 8–23)
CO2: 23 mmol/L (ref 22–32)
Calcium: 9.6 mg/dL (ref 8.9–10.3)
Chloride: 112 mmol/L — ABNORMAL HIGH (ref 98–111)
Creatinine, Ser: 1.55 mg/dL — ABNORMAL HIGH (ref 0.44–1.00)
GFR, Estimated: 33 mL/min — ABNORMAL LOW (ref >60)
Glucose, Bld: 95 mg/dL (ref 70–99)
Potassium: 3.7 mmol/L (ref 3.5–5.1)
Sodium: 140 mmol/L (ref 135–145)
Total Bilirubin: 0.9 mg/dL (ref 0.0–1.2)
Total Protein: 6.3 g/dL — ABNORMAL LOW (ref 6.5–8.1)

## 2023-12-28 LAB — URINE CULTURE: Culture: >=100000 — AB

## 2023-12-28 LAB — CBC WITH DIFFERENTIAL/PLATELET
Abs Immature Granulocytes: 0.02 10*3/uL (ref 0.00–0.07)
Basophils Absolute: 0 10*3/uL (ref 0.0–0.1)
Basophils Relative: 1 %
Eosinophils Absolute: 0.2 10*3/uL (ref 0.0–0.5)
Eosinophils Relative: 4 %
HCT: 32.5 % — ABNORMAL LOW (ref 36.0–46.0)
Hemoglobin: 10.6 g/dL — ABNORMAL LOW (ref 12.0–15.0)
Immature Granulocytes: 0 %
Lymphocytes Relative: 21 %
Lymphs Abs: 1.2 10*3/uL (ref 0.7–4.0)
MCH: 32.2 pg (ref 26.0–34.0)
MCHC: 32.6 g/dL (ref 30.0–36.0)
MCV: 98.8 fL (ref 80.0–100.0)
Monocytes Absolute: 0.4 10*3/uL (ref 0.1–1.0)
Monocytes Relative: 8 %
Neutro Abs: 3.8 10*3/uL (ref 1.7–7.7)
Neutrophils Relative %: 66 %
Platelets: 152 10*3/uL (ref 150–400)
RBC: 3.29 MIL/uL — ABNORMAL LOW (ref 3.87–5.11)
RDW: 13.3 % (ref 11.5–15.5)
WBC: 5.6 10*3/uL (ref 4.0–10.5)
nRBC: 0 % (ref 0.0–0.2)

## 2023-12-28 MED ORDER — HYDROCODONE-ACETAMINOPHEN 5-325 MG PO TABS: 1.0000 | ORAL_TABLET | Freq: Four times a day (QID) | ORAL | 0 refills | Status: AC | PRN

## 2023-12-28 MED ORDER — ONDANSETRON HCL 4 MG PO TABS: 4.0000 mg | ORAL_TABLET | Freq: Four times a day (QID) | ORAL | Status: AC | PRN

## 2023-12-28 MED ORDER — AMLODIPINE BESYLATE 5 MG PO TABS: 5.0000 mg | ORAL_TABLET | Freq: Every day | ORAL | Status: AC

## 2023-12-28 MED ORDER — POLYETHYLENE GLYCOL 3350 17 G PO PACK: 17.0000 g | PACK | Freq: Every day | ORAL | Status: AC | PRN

## 2023-12-28 MED ORDER — FUROSEMIDE 40 MG PO TABS: 40.0000 mg | ORAL_TABLET | Freq: Every day | ORAL | Status: AC | PRN

## 2023-12-28 MED ORDER — CLONAZEPAM 0.5 MG PO TABS: 0.5000 mg | ORAL_TABLET | Freq: Two times a day (BID) | ORAL | 0 refills | Status: AC | PRN

## 2023-12-28 MED ORDER — GENTAMICIN SULFATE 40 MG/ML IJ SOLN
5.0000 mg/kg | Freq: Once | INTRAVENOUS | Status: AC
  Administered 2023-12-28: 300 mg via INTRAVENOUS
  Filled 2023-12-28: qty 7.5

## 2023-12-28 NOTE — Discharge Summary (Addendum)
 [facility] Phys[facility] Discharge Summary   Patient name: [name]  Admit date:     12/25/2023  Discharge date: 12/28/2023  Attending Physician: Onnie Boer [[id number]]  Discharge Physician: Carollee Herter   PCP: Charlynne Pander, MD  Admitted From: SNF [facility] Disposition:   [facility]  SNF  Recommendations for Outpatient Follow-up:  Follow up with PCP in 1-2 weeks Outpatient referral made for orthopedic surgery regarding right tibial fracture  Home Health:No Equipment/Devices: None  Discharge Condition:Stable CODE STATUS:DNR/DNI Diet recommendation: Heart Healthy Fluid Restriction: None  Hospital Summary: HPI: [name] is a 83 y.o. adult with medical history significant for COPD, dementia, depression, hypertension, schizophrenia, HIV, ischemic colitis, large bowel perforation colostomy status. Patient was brought to the ED from nursing home reports of a fall with subsequent swelling to the right knee, and pain to the right leg.  Per EDP, at baseline patient is nonambulatory.  On my evaluation, patient is sleeping, she is arousable, answers a few questions, knows she is in the hospital, answers a few questions but quickly drifts back to sleep.   ED Course: Febrile to 100.6.  Heart rate 70s.  Respiratory rate 13-20.  Blood pressure systolic 110-153.  O2 sats greater than 95% on room air. UA suggestive of UTI.  WBC 8.9. Imaging included right knee CT-findings suspicious for nondisplaced lateral tibial plateau fracture without significant articular depression and a large joint effusion. Right hip x-ray, lumbar and thoracic spine x-ray, right foot and right ankle pain, pelvic x-ray, head and cervical CT-all negative for acute abnormality. EDP talked to orthopedist-conservative management with knee immobilizer. IV ceftriaxone given. Hospitalist to admit for fall, right knee fracture, and UTI.  Significant Events: Admitted 12/25/2023 for closed fracture of  right tibia plateau, acute cystitis   Significant Labs: WBC 8.7, Hgb 12.4, Plt 173 Na 139, K 3.6, CO2 of 22, BUN 23, Scr 1.4, glu 108  Significant Imaging Studies: CT head/c-spine No acute intracranial pathology. Small-vessel white matter disease and nonacute lacunar infarction of the right basal ganglia. 2. No fracture or static subluxation of the cervical spine. 3. Moderate cervical disc degenerative disease. Right Knee XR shows Large joint effusion. 2. No evidence of fracture dislocation, however in the setting of joint effusion, consider further assessment with CT to assess for radiographically occult fracture. Pelvic XR shows No acute fracture of the pelvis or right hip.  Right Ankle/Foot XR shows No evidence of acute fracture of the right foot or ankle. 2. Hallux valgus and degenerative change of the first metatarsal phalangeal joint. Hammertoe deformity of the toes. T-spine XR shows No acute fracture of the thoracic spine. Mild degenerative change L-spine XR shows No evidence of lumbar spine fracture. Multilevel degenerative change. CT right hip/knee shows No evidence of hip fracture, allowing for motion limitations. Findings suspicious for nondisplaced lateral tibial plateau fracture without significant articular depression. Large joint effusion.  Antibiotic Therapy: Anti-infectives (From admission, onward)    Start     Dose/Rate Route Frequency Ordered Stop   12/26/23 1000  bictegravir-emtricitabine-tenofovir AF (BIKTARVY) 50-200-25 MG per tablet 1 tablet        1 tablet Oral Daily 12/26/23 0014     12/26/23 0600  piperacillin-tazobactam (ZOSYN) IVPB 3.375 g        3.375 g 12.5 mL/hr over 240 Minutes Intravenous Every 8 hours 12/25/23 2257     12/25/23 2230  piperacillin-tazobactam (ZOSYN) IVPB 3.375 g        3.375 g 100 mL/hr over  30 Minutes Intravenous  Once 12/25/23 2226 12/25/23 2258   12/25/23 1930  cefTRIAXone (ROCEPHIN) 1 g in sodium chloride 0.9 % 100 mL IVPB        1  g 200 mL/hr over 30 Minutes Intravenous  Once 12/25/23 1920 12/25/23 2046       Procedures:   Consultants:    Hospital Course by Problem: * Closed fracture of lateral portion of right tibial plateau On admission. As seen on CT of the right knee.  Status post fall.  Patient is nonambulatory at baseline.  Nursing home resident. Head and cervical CT including imaging of right foot, ankle, pelvis, thoracic and lumbar spine all negative for acute abnormality. - EDP to orthopedist Dr. Ave Filter, recommended conservative management-no weightbearing, and knee immobilizer  -Hydrocodone as needed  12-26-2023 continue with knee immobilizer. Outpatient referral to Dr. Ave Filter at discharge.Marland Kitchen   12-27-2023 stable. Non-ambulatory at baseline. Continue knee immobilizer.  12-28-2023 ready for DC. Continue NWB for 4 weeks. F/u with Dr. Ave Filter at discharge.  Infection due to extended spectrum beta lactamase (ESBL) producing Proteus mirabilis - UTI On admission. Presenting with fever of 100.6, diarrheal episodes in the ED.  Rules out for sepsis.  WBC 8.7.  UA suggestive of UTI with moderate leukocytes and many bacteria.  Stool C. difficile negative.  Chest x-ray without acute abnormality. -Recent urine cultures growing multidrug-resistant Proteus, Enterococcus. -Consulted pharm for assistance with choice of antibiotics-Zosyn only is okay.   -Check COVID, influenza and RSV-negative - Add-on urine cultures -Appears to be dehydrated, continue LR at 75 cc/h for 1 day.  12-26-2023 awaiting urine cultures. Continue IV zosyn for now.  12-27-2023 GNR growing from urine cx. No final yet. Continue zosyn. Start IVF. Urine is very concentrated in purewick container.  12-28-2023 urine cx growing ESBL proteus. Discussed with ID & pharmacy. 1 dose of IV gentamicin was felt to be adequate treatment for UTI. Had received over 48 hours of IV zosyn  DNR (do not resuscitate)/DNI(Do Not Intubate) 12-26-2023 pt is  DNR/DNI    Dementia with behavioral disturbance (HCC) On admission.  Stable. -Resume venlafaxine, donepezil  12-26-2023 stable. 12-27-2023 stable. Chronic.  12-28-2023 continue Effexor-XR 75 mg daily, remeron 15 mg at bedtime, aricept 5 mg qam and 10 mg at bedtime, abillify 5 mg qday  HIV disease (HCC) On admission. 05/2023- Last HIV viral load not detected.  CD4 helper cells 506. -Resume Biktarvy  12-26-2023 follows with outpatient ID clinic. 12-27-2023 stable. Chronic.  12-28-2023 continue Biktarvy. F/u with ID clinic as scheduled.  History of schizophrenia 12-26-2023 stable. 12-27-2023 stable. Chronic.  12-28-2023 stable.   CKD stage 3b, GFR 30-44 ml/min (HCC) - baseline SCr 1.2-1.6 On admission. Creatinine 1.4.  Stable.  12-26-2023 stable. Chronic.  12-27-2023 start IVF today for poor po intake.  12-28-2023 urine in purewick container much lighter in color today after IVF overnight. Scr 1.55 today. BUN 16. Will stop ARB at discharge due to poor po intake.  Essential hypertension On admission. Stable. -Resume losartan  12-26-2023 stable. Chronic. On losartan 12.5 mg daily.  12-27-2023 hold losartan due to poor po intake. Didn't stop it in time for today. Already got today's dose. start IVF today for poor po intake.  12-28-2023 BP improved with IVF yesterday. Given poor po intake, will stop ARB at discharge. Use norvasc 5 mg daily. Hold for SBP <120    Discharge Diagnoses:  Principal Problem:   Closed fracture of lateral portion of right tibial plateau Active Problems:   Infection  due to extended spectrum beta lactamase (ESBL) producing Proteus mirabilis - UTI   Essential hypertension   CKD stage 3b, GFR 30-44 ml/min (HCC) - baseline SCr 1.2-1.6   History of schizophrenia   HIV disease (HCC)   Dementia with behavioral disturbance (HCC)   DNR (do not resuscitate)/DNI(Do Not Intubate)   Discharge Instructions  Discharge Instructions     Ambulatory  referral to Orthopedic Surgery   Complete by: As directed    Tibial plateau fracture. He was contacted on 12-25-2023 by [facility] provider.   Call MD for:  difficulty breathing, headache or visual disturbances   Complete by: As directed    Call MD for:  extreme fatigue   Complete by: As directed    Call MD for:  hives   Complete by: As directed    Call MD for:  persistant dizziness or light-headedness   Complete by: As directed    Call MD for:  persistant nausea and vomiting   Complete by: As directed    Call MD for:  redness, tenderness, or signs of infection (pain, swelling, redness, odor or green/yellow discharge around incision site)   Complete by: As directed    Call MD for:  severe uncontrolled pain   Complete by: As directed    Call MD for:  temperature >100.4   Complete by: As directed    Diet - low sodium heart healthy   Complete by: As directed    Please keep pitcher of water by patient's bedside at all times.   Discharge instructions   Complete by: As directed    1. Follow up with your primary care provider in 1-2 weeks following discharge from hospital. 2. Outpatient referral made for orthopedic surgery regarding fracture. 3. Remain non-weight bearing right lower leg until cleared for weight bearing status by orthopedics 4. Pt to wear right knee immobilizer at all times until cleared for removal by orthopedics. Knee immobilizer may be removed for patient care as needed.   Increase activity slowly   Complete by: As directed    Non weight bearing   Complete by: As directed    Laterality: right   Extremity: Lower      Allergies as of 12/28/2023       Reactions   Bee Venom Anaphylaxis   Promethazine Swelling   Tenofovir Disoproxil Fumarate [tenofovir Disoproxil Fumarate] Other (See Comments)   Renal failure, (08/27/2006)   Compazine  [prochlorperazine Edisylate] Nausea And Vomiting   Haloperidol Lactate Other (See Comments)   Reaction is muscle tension, causes  severe spasms in face and neck. Forces eyes to roll in the back of the head   Sulfa Antibiotics Diarrhea   Brompheniramine-acetaminophen Other (See Comments)   unknown   Chlorcyclizine Other (See Comments)   Unknown   Chlorpromazine Other (See Comments)   Unknown   Codeine Itching, Nausea And Vomiting   Navane [thiothixene (tiotixene)] Other (See Comments), Nausea And Vomiting   Same muscle spasm reaction as haldol   Prednisone Other (See Comments)   Brief mild psychosis and agitation   Promethazine Hcl Other (See Comments)   Propoxyphene N-acetaminophen Itching, Nausea And Vomiting   Morphine Itching, Swelling        Medication List     STOP taking these medications    cephALEXin 250 MG capsule Commonly known as: KEFLEX   losartan 25 MG tablet Commonly known as: COZAAR   Magnesium 400 MG Tabs   senna 8.6 MG tablet Commonly known as: [employer]  TAKE these medications    Allergy Relief 10 MG tablet Generic drug: loratadine Take 10 mg by mouth daily.   amLODipine 5 MG tablet Commonly known as: NORVASC Take 1 tablet (5 mg total) by mouth daily.   ARIPiprazole 5 MG tablet Commonly known as: ABILIFY Take 5 mg by mouth daily.   atorvastatin 20 MG tablet Commonly known as: LIPITOR Take 20 mg by mouth daily.   Biktarvy 50-200-25 MG Tabs tablet Generic drug: bictegravir-emtricitabine-tenofovir AF Take 1 tablet by mouth daily. Please discontinue TRIUMEQ and TIVICAY What changed: additional instructions   clonazePAM 0.5 MG tablet Commonly known as: KLONOPIN Take 1 tablet (0.5 mg total) by mouth 2 (two) times daily as needed for anxiety. What changed:  when to take this reasons to take this   docusate sodium 100 MG capsule Commonly known as: COLACE Take 1 capsule (100 mg total) by mouth 2 (two) times daily.   donepezil 5 MG tablet Commonly known as: ARICEPT Take 5-10 mg by mouth in the morning and at bedtime. 5mg  in the morning and 10mg  at bedtime    furosemide 40 MG tablet Commonly known as: LASIX Take 1 tablet (40 mg total) by mouth daily as needed for fluid or edema. Hold until follow up with PCP What changed: reasons to take this   HYDROcodone-acetaminophen 5-325 MG tablet Commonly known as: NORCO/VICODIN Take 1 tablet by mouth every 6 (six) hours as needed for up to 14 days for severe pain (pain score 7-10) or moderate pain (pain score 4-6).   ipratropium-albuterol 0.5-2.5 (3) MG/3ML Soln Commonly known as: DUONEB Inhale 3 mLs into the lungs every 6 (six) hours as needed.   mirabegron ER 50 MG Tb24 tablet Commonly known as: MYRBETRIQ Take 1 tablet (50 mg total) by mouth daily.   mirtazapine 7.5 MG tablet Commonly known as: REMERON Take 15 mg by mouth at bedtime.   ondansetron 4 MG tablet Commonly known as: ZOFRAN Take 1 tablet (4 mg total) by mouth every 6 (six) hours as needed for nausea.   ONE-A-DAY 55 PLUS PO Take 1 tablet by mouth daily.   pantoprazole 20 MG tablet Commonly known as: PROTONIX Take 20 mg by mouth daily.   polyethylene glycol 17 g packet Commonly known as: MiraLax Take 17 g by mouth daily as needed for mild constipation. Hold for diarrhea What changed:  when to take this reasons to take this   polyvinyl alcohol 1.4 % ophthalmic solution Commonly known as: LIQUIFILM TEARS Place 1 drop into both eyes daily.   thiamine 100 MG tablet Commonly known as: Vitamin B-1 Take 100 mg by mouth daily.   Venlafaxine HCl 75 MG Tb24 Take 75 mg by mouth daily.               Discharge Care Instructions  (From admission, onward)           Start     Ordered   12/28/23 0000  Non weight bearing       Question Answer Comment  Laterality right   Extremity Lower      12/28/23 1156            Allergies  Allergen Reactions   Bee Venom Anaphylaxis   Promethazine Swelling   Tenofovir Disoproxil Fumarate [Tenofovir Disoproxil Fumarate] Other (See Comments)    Renal failure,  (08/27/2006)   Compazine  [Prochlorperazine Edisylate] Nausea And Vomiting   Haloperidol Lactate Other (See Comments)    Reaction is muscle tension, causes severe spasms in  face and neck. Forces eyes to roll in the back of the head   Sulfa Antibiotics Diarrhea   Brompheniramine-Acetaminophen Other (See Comments)    unknown   Chlorcyclizine Other (See Comments)    Unknown   Chlorpromazine Other (See Comments)    Unknown   Codeine Itching and Nausea And Vomiting   Navane [Thiothixene (Tiotixene)] Other (See Comments) and Nausea And Vomiting    Same muscle spasm reaction as haldol   Prednisone Other (See Comments)    Brief mild psychosis and agitation   Promethazine Hcl Other (See Comments)   Propoxyphene N-Acetaminophen Itching and Nausea And Vomiting   Morphine Itching and Swelling    Discharge Exam: Vitals:   12/27/23 2053 12/28/23 0501  BP: (!) 112/55 131/66  Pulse: 70 74  Resp: 18 17  Temp: 99.2 F (37.3 C) 98.6 F (37 C)  SpO2: 95% 96%    Physical Exam Vitals and nursing note reviewed.  Constitutional:      General: She is not in acute distress.    Appearance: She is not toxic-appearing or diaphoretic.  HENT:     Head: Normocephalic and atraumatic.     Nose: Nose normal.  Cardiovascular:     Rate and Rhythm: Normal rate and regular rhythm.  Pulmonary:     Effort: Pulmonary effort is normal.     Breath sounds: Normal breath sounds.  Abdominal:     General: Bowel sounds are normal.     Palpations: Abdomen is soft.  Musculoskeletal:     Right lower leg: No edema.     Left lower leg: No edema.     Comments: Right knee in imobilizer  Skin:    General: Skin is warm and dry.     Capillary Refill: Capillary refill takes less than 2 seconds.  Neurological:     General: No focal deficit present.     Mental Status: She is alert.     The results of significant diagnostics from this hospitalization (including imaging, microbiology, ancillary and laboratory) are  listed below for reference.    Microbiology: Recent Results (from the past 240 hours)  Stool culture     Status: None   Collection Time: 12/25/23  4:06 PM   Specimen: Stool  Result Value Ref Range Status   Salmonella/Shigella Screen DUPLICATE  Final    Comment: (NOTE) Duplicate procedure ordered. Please see [phone] for results.    Campylobacter Culture NOT PERFORMED  Final    Comment: Test not performed   E coli, Shiga toxin Assay NOT PERFORMED  Corrected    Comment: (NOTE) Test not performed Performed At: [facility] [address] [place], [place] [id number] Jolene Schimke MD GE:[id number] CORRECTED ON 03/04 AT 1136: PREVIOUSLY REPORTED AS Negative Reference range: Negative   C Difficile Quick Screen w PCR reflex     Status: None   Collection Time: 12/25/23  4:06 PM   Specimen: Stool  Result Value Ref Range Status   C Diff antigen NEGATIVE NEGATIVE Final   C Diff toxin NEGATIVE NEGATIVE Final   C Diff interpretation No C. difficile detected.  Final    Comment: Performed at [facility], [address], [place], [place] [address]  Urine Culture (for pregnant, neutropenic or urologic patients or patients with an indwelling urinary catheter)     Status: Abnormal   Collection Time: 12/25/23  5:37 PM   Specimen: Urine, Clean Catch  Result Value Ref Range Status   Specimen Description   Final  URINE, CLEAN CATCH Performed at [facility], [address], [place], [place] [address]    Special Requests   Final    NONE Performed at [facility], [address] [place], [place] [address]    Culture (A)  Final    >=100,000 COLONIES/mL PROTEUS MIRABILIS Susceptibility Pattern Suggests Possibility of an Extended Spectrum Beta Lactamase Producer. Contact Laboratory Within 7 Days if Confirmation Warranted. Performed at [facility], [address], [place], [place] [address]    Report Status 12/28/2023 FINAL  Final   Organism ID, Bacteria PROTEUS MIRABILIS  (A)  Final      Susceptibility   Proteus mirabilis - MIC*    AMPICILLIN >=32 RESISTANT Resistant     CEFAZOLIN >=64 RESISTANT Resistant     CEFEPIME 4 INTERMEDIATE Intermediate     CEFTRIAXONE 8 RESISTANT Resistant     CIPROFLOXACIN >=4 RESISTANT Resistant     GENTAMICIN <=1 SENSITIVE Sensitive     IMIPENEM 8 INTERMEDIATE Intermediate     NITROFURANTOIN 128 RESISTANT Resistant     TRIMETH/SULFA >=320 RESISTANT Resistant     AMPICILLIN/SULBACTAM >=32 RESISTANT Resistant     PIP/TAZO 64 INTERMEDIATE Intermediate ug/mL    * >=100,000 COLONIES/mL PROTEUS MIRABILIS  Culture, blood (routine x 2)     Status: None (Preliminary result)   Collection Time: 12/25/23  8:02 PM   Specimen: BLOOD RIGHT ARM  Result Value Ref Range Status   Specimen Description BLOOD RIGHT ARM  Final   Special Requests   Final    BOTTLES DRAWN AEROBIC AND ANAEROBIC Blood Culture adequate volume   Culture   Final    NO GROWTH 3 DAYS Performed at [facility], [address] [place], [place] [address]    Report Status PENDING  Incomplete  Culture, blood (routine x 2)     Status: None (Preliminary result)   Collection Time: 12/25/23  8:16 PM   Specimen: Left Antecubital; Blood  Result Value Ref Range Status   Specimen Description LEFT ANTECUBITAL  Final   Special Requests   Final    BOTTLES DRAWN AEROBIC AND ANAEROBIC Blood Culture adequate volume   Culture   Final    NO GROWTH 3 DAYS Performed at [facility], [address] [place], [place] [address]    Report Status PENDING  Incomplete  Resp panel by RT-PCR (RSV, Flu A&B, Covid) Anterior Nasal Swab     Status: None   Collection Time: 12/25/23  8:35 PM   Specimen: Anterior Nasal Swab  Result Value Ref Range Status   SARS Coronavirus 2 by RT PCR NEGATIVE NEGATIVE Final    Comment: (NOTE) SARS-CoV-2 target nucleic acids are NOT DETECTED.  The SARS-CoV-2 RNA is generally detectable in upper respiratory specimens during the acute phase of infection. The  lowest concentration of SARS-CoV-2 viral copies this assay can detect is 138 copies/mL. A negative result does not preclude SARS-Cov-2 infection and should not be used as the sole basis for treatment or other patient management decisions. A negative result may occur with  improper specimen collection/handling, submission of specimen other than nasopharyngeal swab, presence of viral mutation(s) within the areas targeted by this assay, and inadequate number of viral copies(<138 copies/mL). A negative result must be combined with clinical observations, patient history, and epidemiological information. The expected result is Negative.  Fact Sheet for Patients:  BloggerCourse.com  Fact Sheet for Healthcare Providers:  SeriousBroker.it  This test is no t yet approved or cleared by the [place] and  has been authorized for detection and/or diagnosis of SARS-CoV-2 by FDA under an Emergency Use Authorization (EUA). This EUA will remain  in effect (meaning this test can be used) for the duration of the COVID-19 declaration under Section 564(b)(1) of the Act, 21 U.S.C.section 360bbb-3(b)(1), unless the authorization is terminated  or revoked sooner.       Influenza A by PCR NEGATIVE NEGATIVE Final   Influenza B by PCR NEGATIVE NEGATIVE Final    Comment: (NOTE) The Xpert Xpress SARS-CoV-2/FLU/RSV plus assay is intended as an aid in the diagnosis of influenza from Nasopharyngeal swab specimens and should not be used as a sole basis for treatment. Nasal washings and aspirates are unacceptable for Xpert Xpress SARS-CoV-2/FLU/RSV testing.  Fact Sheet for Patients: BloggerCourse.com  Fact Sheet for Healthcare Providers: SeriousBroker.it  This test is not yet approved or cleared by the [place] FDA and has been authorized for detection and/or diagnosis of SARS-CoV-2 by FDA under  an Emergency Use Authorization (EUA). This EUA will remain in effect (meaning this test can be used) for the duration of the COVID-19 declaration under Section 564(b)(1) of the Act, 21 U.S.C. section 360bbb-3(b)(1), unless the authorization is terminated or revoked.     Resp Syncytial Virus by PCR NEGATIVE NEGATIVE Final    Comment: (NOTE) Fact Sheet for Patients: BloggerCourse.com  Fact Sheet for Healthcare Providers: SeriousBroker.it  This test is not yet approved or cleared by the [place] FDA and has been authorized for detection and/or diagnosis of SARS-CoV-2 by FDA under an Emergency Use Authorization (EUA). This EUA will remain in effect (meaning this test can be used) for the duration of the COVID-19 declaration under Section 564(b)(1) of the Act, 21 U.S.C. section 360bbb-3(b)(1), unless the authorization is terminated or revoked.  Performed at [facility], [address] [place], [place] [address]      Labs: Basic Metabolic Panel: Recent Labs  Lab 12/25/23 1630 12/28/23 0346  NA 139 140  K 3.6 3.7  CL 105 112*  CO2 22 23  GLUCOSE 108* 95  BUN 23 16  CREATININE 1.40* 1.55*  CALCIUM 10.3 9.6   Liver Function Tests: Recent Labs  Lab 12/25/23 1630 12/28/23 0346  AST 26 15  ALT 17 12  ALKPHOS 96 64  BILITOT 1.4* 0.9  PROT 7.2 6.3*  ALBUMIN 3.9 3.3*   CBC: Recent Labs  Lab 12/25/23 1630 12/28/23 0346  WBC 8.7 5.6  NEUTROABS 5.4 3.8  HGB 12.4 10.6*  HCT 36.2 32.5*  MCV 93.5 98.8  PLT 173 152    Urinalysis    Component Value Date/Time   COLORURINE YELLOW 12/25/2023 1737   APPEARANCEUR TURBID (A) 12/25/2023 1737   LABSPEC 1.022 12/25/2023 1737   PHURINE 8.0 12/25/2023 1737   GLUCOSEU NEGATIVE 12/25/2023 1737   HGBUR NEGATIVE 12/25/2023 1737   BILIRUBINUR NEGATIVE 12/25/2023 1737   KETONESUR NEGATIVE 12/25/2023 1737   PROTEINUR >=300 (A) 12/25/2023 1737   NITRITE NEGATIVE 12/25/2023  1737   LEUKOCYTESUR MODERATE (A) 12/25/2023 1737   Sepsis Labs Recent Labs  Lab 12/25/23 1630 12/28/23 0346  WBC 8.7 5.6    Procedures/Studies: CT Knee Right Wo Contrast Result Date: 12/25/2023 CLINICAL DATA:  Knee trauma, internal derangement suspected, xray done EXAM: CT OF THE RIGHT KNEE WITHOUT CONTRAST TECHNIQUE: Multidetector CT imaging of the right knee was performed according to the standard protocol. Multiplanar CT image reconstructions were also generated. RADIATION DOSE REDUCTION: This exam was performed according to the departmental dose-optimization program which includes automated  exposure control, adjustment of the mA and/or kV according to patient size and/or use of iterative reconstruction technique. COMPARISON:  Radiograph earlier today FINDINGS: Bones/Joint/Cartilage The bones are markedly under mineralized. There is irregularity about the lateral tibial plateau without significant articular depression, sagittal series 9, image 73 and coronal series 6, image 96 and may represent an impaction fracture. No other fracture. Mild osteoarthritis with spurring. Large joint effusion with layering. Small Baker cyst. Chondrocalcinosis. Ligaments Suboptimally assessed by CT. Muscles and Tendons No intramuscular hematoma. Soft tissues Mild soft tissue edema. IMPRESSION: Findings suspicious for nondisplaced lateral tibial plateau fracture without significant articular depression. Large joint effusion. Marked osteoporosis limiting assessment. Electronically Signed   By: Narda Rutherford M.D.   On: 12/25/2023 17:26   CT Hip Right Wo Contrast Result Date: 12/25/2023 CLINICAL DATA:  Hip trauma, fracture suspected, xray done EXAM: CT OF THE RIGHT HIP WITHOUT CONTRAST TECHNIQUE: Multidetector CT imaging of the right hip was performed according to the standard protocol. Multiplanar CT image reconstructions were also generated. RADIATION DOSE REDUCTION: This exam was performed according to the  departmental dose-optimization program which includes automated exposure control, adjustment of the mA and/or kV according to patient size and/or use of iterative reconstruction technique. COMPARISON:  Radiograph earlier today, abdominopelvic CT 09/16/2023 FINDINGS: Motion artifact limitations. Bones/Joint/Cartilage Allowing for motion, no evidence of acute fracture. Included pelvis is intact, including pubic rami. Acetabular spurring which is suboptimally assessed due to motion. No gross joint effusion. Ligaments Suboptimally assessed by CT. Muscles and Tendons Gluteal muscle atrophy.  No intramuscular hematoma. Soft tissues No acute findings. Arterial vascular calcifications. Large amount of stool in the included colon. IMPRESSION: No evidence of hip fracture, allowing for motion limitations. Electronically Signed   By: Narda Rutherford M.D.   On: 12/25/2023 17:21   DG Thoracic Spine 2 View Result Date: 12/25/2023 CLINICAL DATA:  Pain after fall. EXAM: THORACIC SPINE 2 VIEWS COMPARISON:  None Available. FINDINGS: Normal alignment. No evidence of acute fracture or compression deformity. Mild mid lower thoracic degenerative disc disease. No paravertebral soft tissue abnormality to suggest fracture. IMPRESSION: No acute fracture of the thoracic spine. Mild degenerative change. Electronically Signed   By: Narda Rutherford M.D.   On: 12/25/2023 15:42   DG Lumbar Spine Complete Result Date: 12/25/2023 CLINICAL DATA:  Pain after fall. EXAM: LUMBAR SPINE - COMPLETE 4+ VIEW COMPARISON:  None Available. FINDINGS: Five non-rib-bearing lumbar vertebra. Normal alignment. No evidence of fracture or compression deformity. Posterior elements are suboptimally assessed due to bony under mineralization and overlying bowel gas. Degenerative disc disease with disc space narrowing and spurring L2-L3 and L3-L4. Scattered facet hypertrophy. IMPRESSION: No evidence of lumbar spine fracture. Multilevel degenerative change.  Electronically Signed   By: Narda Rutherford M.D.   On: 12/25/2023 15:42   DG Ankle Complete Right Result Date: 12/25/2023 CLINICAL DATA:  Pain after injury. EXAM: RIGHT FOOT COMPLETE - 3+ VIEW; RIGHT ANKLE - COMPLETE 3+ VIEW COMPARISON:  None Available. FINDINGS: Ankle: The bones are subjectively under mineralized which limits assessment. No evidence of acute fracture. The ankle mortise is preserved. No definite focal soft tissue abnormality. Foot: No evidence of acute fracture. Moderate hallux valgus and degenerative change of the first metatarsal phalangeal joint. Hammertoe deformity of the toes which further limits digit assessment. Minor talonavicular degenerative spurring. Question of mild generalized subcutaneous edema. IMPRESSION: 1. No evidence of acute fracture of the right foot or ankle. 2. Hallux valgus and degenerative change of the first metatarsal phalangeal joint. Hammertoe  deformity of the toes. Electronically Signed   By: Narda Rutherford M.D.   On: 12/25/2023 15:40   DG Foot Complete Right Result Date: 12/25/2023 CLINICAL DATA:  Pain after injury. EXAM: RIGHT FOOT COMPLETE - 3+ VIEW; RIGHT ANKLE - COMPLETE 3+ VIEW COMPARISON:  None Available. FINDINGS: Ankle: The bones are subjectively under mineralized which limits assessment. No evidence of acute fracture. The ankle mortise is preserved. No definite focal soft tissue abnormality. Foot: No evidence of acute fracture. Moderate hallux valgus and degenerative change of the first metatarsal phalangeal joint. Hammertoe deformity of the toes which further limits digit assessment. Minor talonavicular degenerative spurring. Question of mild generalized subcutaneous edema. IMPRESSION: 1. No evidence of acute fracture of the right foot or ankle. 2. Hallux valgus and degenerative change of the first metatarsal phalangeal joint. Hammertoe deformity of the toes. Electronically Signed   By: Narda Rutherford M.D.   On: 12/25/2023 15:40   DG Knee Complete  4 Views Right Result Date: 12/25/2023 CLINICAL DATA:  Right knee pain after injury. EXAM: RIGHT KNEE - COMPLETE 4+ VIEW COMPARISON:  12/01/2021 FINDINGS: No acute fracture or dislocation. Tricompartmental osteoarthritis with joint space narrowing and spurring. There is a large joint effusion but no evidence of lipohemarthrosis. The bones are subjectively under mineralized. Chondrocalcinosis. IMPRESSION: 1. Large joint effusion. 2. No evidence of fracture dislocation, however in the setting of joint effusion, consider further assessment with CT to assess for radiographically occult fracture. Electronically Signed   By: Narda Rutherford M.D.   On: 12/25/2023 15:39   DG Hip Unilat W or Wo Pelvis 2-3 Views Right Result Date: 12/25/2023 CLINICAL DATA:  Pain after fall. EXAM: DG HIP (WITH OR WITHOUT PELVIS) 2-3V RIGHT COMPARISON:  Hip radiograph 12/01/2021 FINDINGS: No acute fracture of the pelvis or right hip. No hip dislocation. Mild right hip osteoarthritis. Chronic calcification in the right hamstrings insertion about the ischium. Pubic rami are intact. Pubic symphysis and sacroiliac joints are congruent. The bones are subjectively under mineralized. IMPRESSION: No acute fracture of the pelvis or right hip. Electronically Signed   By: Narda Rutherford M.D.   On: 12/25/2023 15:37   CT Head Wo Contrast Result Date: 12/25/2023 CLINICAL DATA:  Fall last night EXAM: CT HEAD WITHOUT CONTRAST CT CERVICAL SPINE WITHOUT CONTRAST TECHNIQUE: Multidetector CT imaging of the head and cervical spine was performed following the standard protocol without intravenous contrast. Multiplanar CT image reconstructions of the cervical spine were also generated. RADIATION DOSE REDUCTION: This exam was performed according to the departmental dose-optimization program which includes automated exposure control, adjustment of the mA and/or kV according to patient size and/or use of iterative reconstruction technique. COMPARISON:  12/01/2021  FINDINGS: CT HEAD FINDINGS Brain: No evidence of acute infarction, hemorrhage, hydrocephalus, extra-axial collection or mass lesion/mass effect. Mild periventricular white matter hypodensity. Nonacute lacunar infarction of the right basal ganglia. Vascular: No hyperdense vessel or unexpected calcification. Skull: Normal. Negative for fracture or focal lesion. Sinuses/Orbits: No acute finding. Other: None. CT CERVICAL SPINE FINDINGS Alignment: Degenerative straightening of the normal cervical lordosis. Skull base and vertebrae: No acute fracture. No primary bone lesion or focal pathologic process. Soft tissues and spinal canal: No prevertebral fluid or swelling. No visible canal hematoma. Disc levels: Moderate disc space height loss and osteophytosis, worst from C5-C7 Upper chest: Negative. Other: None. IMPRESSION: 1. No acute intracranial pathology. Small-vessel white matter disease and nonacute lacunar infarction of the right basal ganglia. 2. No fracture or static subluxation of the cervical spine. 3.  Moderate cervical disc degenerative disease. Electronically Signed   By: Jearld Lesch M.D.   On: 12/25/2023 15:29   CT Cervical Spine Wo Contrast Result Date: 12/25/2023 CLINICAL DATA:  Fall last night EXAM: CT HEAD WITHOUT CONTRAST CT CERVICAL SPINE WITHOUT CONTRAST TECHNIQUE: Multidetector CT imaging of the head and cervical spine was performed following the standard protocol without intravenous contrast. Multiplanar CT image reconstructions of the cervical spine were also generated. RADIATION DOSE REDUCTION: This exam was performed according to the departmental dose-optimization program which includes automated exposure control, adjustment of the mA and/or kV according to patient size and/or use of iterative reconstruction technique. COMPARISON:  12/01/2021 FINDINGS: CT HEAD FINDINGS Brain: No evidence of acute infarction, hemorrhage, hydrocephalus, extra-axial collection or mass lesion/mass effect. Mild  periventricular white matter hypodensity. Nonacute lacunar infarction of the right basal ganglia. Vascular: No hyperdense vessel or unexpected calcification. Skull: Normal. Negative for fracture or focal lesion. Sinuses/Orbits: No acute finding. Other: None. CT CERVICAL SPINE FINDINGS Alignment: Degenerative straightening of the normal cervical lordosis. Skull base and vertebrae: No acute fracture. No primary bone lesion or focal pathologic process. Soft tissues and spinal canal: No prevertebral fluid or swelling. No visible canal hematoma. Disc levels: Moderate disc space height loss and osteophytosis, worst from C5-C7 Upper chest: Negative. Other: None. IMPRESSION: 1. No acute intracranial pathology. Small-vessel white matter disease and nonacute lacunar infarction of the right basal ganglia. 2. No fracture or static subluxation of the cervical spine. 3. Moderate cervical disc degenerative disease. Electronically Signed   By: Jearld Lesch M.D.   On: 12/25/2023 15:29    Time coordinating discharge: 45 mins  SIGNED:  Carollee Herter, DO [facility] 12/28/23, 12:27 PM

## 2023-12-28 NOTE — TOC Transition Note (Signed)
 Transition of Care [facility]) - Discharge Note   Patient Details  Name: [name] MRN: [id number] Date of Birth: 09-Aug-1941  [facility]) CM/SW Contact:  Beather Arbour Phone Number: 12/28/2023, 12:50 PM   Clinical Narrative:     CSW spoke with Debbie. [facility] stated that they can accept pt back today and provided room number ( A28-1) and number to call report ( [phone]. Nurse provided with this information . Debbie did share that pt daughter-n-law did come up there to facility to request a bed by the door for pt . Daughter-n-law aware that pt will DC back to [facility]. Med Necessity form completed, FL2, and DC summary sent to [facility]. TOC signing off   Final next level of care: Skilled Nursing Facility Barriers to Discharge: Barriers Resolved   Patient Goals and CMS Choice Patient states their goals for this hospitalization and ongoing recovery are:: DC to SNF CMS Medicare.gov Compare Post Acute Care list provided to:: Patient Represenative (must comment) (Daughter Fleet Contras) Choice offered to / list presented to : [facility] POA / Guardian Fleet Contras) [place] ownership interest in [facility].provided to:: [facility] POA / Guardian    Discharge Placement PASRR number recieved:  [facility])              Patient to be transferred to facility by: Ambulance Name of family member notified: Fleet Contras - Daughter-n-law Patient and family notified of of transfer: 12/28/23  Discharge Plan and Services Additional resources added to the After Visit Summary for   In-house Referral: Clinical Social Work   Post Acute Care Choice: Skilled Nursing Facility          DME Arranged: N/A DME Agency: NA                  Social Drivers of Health (SDOH) Interventions SDOH Screenings   Food Insecurity: No Food Insecurity (12/25/2023)  Housing: Patient Unable To Answer (12/25/2023)  Transportation Needs: Patient Unable To Answer (12/25/2023)  Utilities: Patient Unable To Answer  (12/25/2023)  Depression (PHQ2-9): Low Risk  (01/05/2022)  Financial Resource Strain: Low Risk  (12/27/2020)   Received from [facility], [facility]  Social Connections: Patient Unable To Answer (12/26/2023)  Stress: Stress Concern Present (12/27/2020)   Received from [facility], [facility]  Tobacco Use: Low Risk  (08/09/2023)     Readmission Risk Interventions    12/28/2023   12:48 PM 12/27/2023    1:52 PM 06/23/2021   11:18 AM  Readmission Risk Prevention Plan  Medication Screening Complete Complete   Transportation Screening Complete Complete Complete  HRI or Home Care Consult   Complete  Social Work Consult for Recovery Care Planning/Counseling   Complete  Palliative Care Screening   Not Applicable  Medication Review Oceanographer)   Complete

## 2023-12-28 NOTE — Progress Notes (Signed)
 PROGRESS NOTE    [name]  ZOX:[id number] DOB: 04-17-1941 DOA: 12/25/2023 PCP: Charlynne Pander, MD  Subjective: Pt seen and examined. Much more awake and alert this AM. Urine in purewick container much lighter in color after IVF overnight.  Ready for DC to SNF.    Hospital Course: HPI: [name] is a 83 y.o. adult with medical history significant for COPD, dementia, depression, hypertension, schizophrenia, HIV, ischemic colitis, large bowel perforation colostomy status. Patient was brought to the ED from nursing home reports of a fall with subsequent swelling to the right knee, and pain to the right leg.  Per EDP, at baseline patient is nonambulatory.  On my evaluation, patient is sleeping, she is arousable, answers a few questions, knows she is in the hospital, answers a few questions but quickly drifts back to sleep.   ED Course: Febrile to 100.6.  Heart rate 70s.  Respiratory rate 13-20.  Blood pressure systolic 110-153.  O2 sats greater than 95% on room air. UA suggestive of UTI.  WBC 8.9. Imaging included right knee CT-findings suspicious for nondisplaced lateral tibial plateau fracture without significant articular depression and a large joint effusion. Right hip x-ray, lumbar and thoracic spine x-ray, right foot and right ankle pain, pelvic x-ray, head and cervical CT-all negative for acute abnormality. EDP talked to orthopedist-conservative management with knee immobilizer. IV ceftriaxone given. Hospitalist to admit for fall, right knee fracture, and UTI.  Significant Events: Admitted 12/25/2023 for closed fracture of right tibia plateau, acute cystitis   Significant Labs: WBC 8.7, Hgb 12.4, Plt 173 Na 139, K 3.6, CO2 of 22, BUN 23, Scr 1.4, glu 108  Significant Imaging Studies: CT head/c-spine No acute intracranial pathology. Small-vessel white matter disease and nonacute lacunar infarction of the right basal ganglia. 2. No fracture or static subluxation of the  cervical spine. 3. Moderate cervical disc degenerative disease. Right Knee XR shows Large joint effusion. 2. No evidence of fracture dislocation, however in the setting of joint effusion, consider further assessment with CT to assess for radiographically occult fracture. Pelvic XR shows No acute fracture of the pelvis or right hip.  Right Ankle/Foot XR shows No evidence of acute fracture of the right foot or ankle. 2. Hallux valgus and degenerative change of the first metatarsal phalangeal joint. Hammertoe deformity of the toes. T-spine XR shows No acute fracture of the thoracic spine. Mild degenerative change L-spine XR shows No evidence of lumbar spine fracture. Multilevel degenerative change. CT right hip/knee shows No evidence of hip fracture, allowing for motion limitations. Findings suspicious for nondisplaced lateral tibial plateau fracture without significant articular depression. Large joint effusion.  Antibiotic Therapy: Anti-infectives (From admission, onward)    Start     Dose/Rate Route Frequency Ordered Stop   12/26/23 1000  bictegravir-emtricitabine-tenofovir AF (BIKTARVY) 50-200-25 MG per tablet 1 tablet        1 tablet Oral Daily 12/26/23 0014     12/26/23 0600  piperacillin-tazobactam (ZOSYN) IVPB 3.375 g        3.375 g 12.5 mL/hr over 240 Minutes Intravenous Every 8 hours 12/25/23 2257     12/25/23 2230  piperacillin-tazobactam (ZOSYN) IVPB 3.375 g        3.375 g 100 mL/hr over 30 Minutes Intravenous  Once 12/25/23 2226 12/25/23 2258   12/25/23 1930  cefTRIAXone (ROCEPHIN) 1 g in sodium chloride 0.9 % 100 mL IVPB        1 g 200 mL/hr over 30 Minutes Intravenous  Once 12/25/23 1920  12/25/23 2046       Procedures:   Consultants:     Assessment and Plan: * Closed fracture of lateral portion of right tibial plateau On admission. As seen on CT of the right knee.  Status post fall.  Patient is nonambulatory at baseline.  Nursing home resident. Head and cervical CT  including imaging of right foot, ankle, pelvis, thoracic and lumbar spine all negative for acute abnormality. - EDP to orthopedist Dr. Ave Filter, recommended conservative management-no weightbearing, and knee immobilizer  -Hydrocodone as needed  12-26-2023 continue with knee immobilizer. Outpatient referral to Dr. Ave Filter at discharge.Marland Kitchen   12-27-2023 stable. Non-ambulatory at baseline. Continue knee immobilizer.  12-28-2023 ready for DC. Continue NWB for 4 weeks. F/u with Dr. Ave Filter at discharge.  Infection due to extended spectrum beta lactamase (ESBL) producing Proteus mirabilis - UTI On admission. Presenting with fever of 100.6, diarrheal episodes in the ED.  Rules out for sepsis.  WBC 8.7.  UA suggestive of UTI with moderate leukocytes and many bacteria.  Stool C. difficile negative.  Chest x-ray without acute abnormality. -Recent urine cultures growing multidrug-resistant Proteus, Enterococcus. -Consulted pharm for assistance with choice of antibiotics-Zosyn only is okay.   -Check COVID, influenza and RSV-negative - Add-on urine cultures -Appears to be dehydrated, continue LR at 75 cc/h for 1 day.  12-26-2023 awaiting urine cultures. Continue IV zosyn for now.  12-27-2023 GNR growing from urine cx. No final yet. Continue zosyn. Start IVF. Urine is very concentrated in purewick container.  12-28-2023 urine cx growing ESBL proteus. Discussed with ID & pharmacy. 1 dose of IV gentamicin was felt to be adequate treatment for UTI. Had received over 48 hours of IV zosyn  DNR (do not resuscitate)/DNI(Do Not Intubate) 12-26-2023 pt is DNR/DNI   Dementia with behavioral disturbance (HCC) On admission.  Stable. -Resume venlafaxine, donepezil  12-26-2023 stable. 12-27-2023 stable. Chronic.  12-28-2023 continue Effexor-XR 75 mg daily, remeron 15 mg at bedtime, aricept 5 mg qam and 10 mg at bedtime, abillify 5 mg qday  HIV disease (HCC) On admission. 05/2023- Last HIV viral load not detected.   CD4 helper cells 506. -Resume Biktarvy  12-26-2023 follows with outpatient ID clinic. 12-27-2023 stable. Chronic.  12-28-2023 continue Biktarvy. F/u with ID clinic as scheduled.  History of schizophrenia 12-26-2023 stable. 12-27-2023 stable. Chronic.  12-28-2023 stable.   CKD stage 3b, GFR 30-44 ml/min (HCC) - baseline SCr 1.2-1.6 On admission. Creatinine 1.4.  Stable.  12-26-2023 stable. Chronic.  12-27-2023 start IVF today for poor po intake.  12-28-2023 urine in purewick container much lighter in color today after IVF overnight. Scr 1.55 today. BUN 16. Will stop ARB at discharge due to poor po intake.  Essential hypertension On admission. Stable. -Resume losartan  12-26-2023 stable. Chronic. On losartan 12.5 mg daily.  12-27-2023 hold losartan due to poor po intake. Didn't stop it in time for today. Already got today's dose. start IVF today for poor po intake.  12-28-2023 BP improved with IVF yesterday. Given poor po intake, will stop ARB at discharge. Use norvasc 5 mg daily. Hold for SBP <120   DVT prophylaxis: enoxaparin (LOVENOX) injection 30 mg Start: 12/26/23 1000    Code Status: Limited: Do not attempt resuscitation (DNR) -DNR-LIMITED -Do Not Intubate/DNI  Family Communication: spoke with pt's family at bedside Disposition Plan: SNF Reason for continuing need for hospitalization: medically stable for DC  Objective: Vitals:   12/27/23 1317 12/27/23 1318 12/27/23 2053 12/28/23 0501  BP: (!) 94/57 (!) 101/51 (!) 112/55  131/66  Pulse: 67  70 74  Resp: 17  18 17   Temp: 98.4 F (36.9 C)  99.2 F (37.3 C) 98.6 F (37 C)  TempSrc: Oral  Oral Oral  SpO2: 97%  95% 96%  Weight:      Height:        Intake/Output Summary (Last 24 hours) at 12/28/2023 1148 Last data filed at 12/28/2023 0900 Gross per 24 hour  Intake 2259.49 ml  Output 750 ml  Net 1509.49 ml   Filed Weights   12/25/23 1405  Weight: 59 kg    Examination:  Physical Exam Vitals and nursing  note reviewed.  Constitutional:      General: She is not in acute distress.    Appearance: She is not toxic-appearing or diaphoretic.  HENT:     Head: Normocephalic and atraumatic.     Nose: Nose normal.  Cardiovascular:     Rate and Rhythm: Normal rate and regular rhythm.  Pulmonary:     Effort: Pulmonary effort is normal.     Breath sounds: Normal breath sounds.  Abdominal:     General: Bowel sounds are normal.     Palpations: Abdomen is soft.  Musculoskeletal:     Right lower leg: No edema.     Left lower leg: No edema.     Comments: Right knee in imobilizer  Skin:    General: Skin is warm and dry.     Capillary Refill: Capillary refill takes less than 2 seconds.  Neurological:     General: No focal deficit present.     Mental Status: She is alert.     Data Reviewed: I have personally reviewed following labs and imaging studies  CBC: Recent Labs  Lab 12/25/23 1630 12/28/23 0346  WBC 8.7 5.6  NEUTROABS 5.4 3.8  HGB 12.4 10.6*  HCT 36.2 32.5*  MCV 93.5 98.8  PLT 173 152   Basic Metabolic Panel: Recent Labs  Lab 12/25/23 1630 12/28/23 0346  NA 139 140  K 3.6 3.7  CL 105 112*  CO2 22 23  GLUCOSE 108* 95  BUN 23 16  CREATININE 1.40* 1.55*  CALCIUM 10.3 9.6   GFR: Estimated Creatinine Clearance: 26.1 mL/min (A) (by C-G formula based on SCr of 1.55 mg/dL (H)). Liver Function Tests: Recent Labs  Lab 12/25/23 1630 12/28/23 0346  AST 26 15  ALT 17 12  ALKPHOS 96 64  BILITOT 1.4* 0.9  PROT 7.2 6.3*  ALBUMIN 3.9 3.3*   Sepsis Labs: Recent Labs  Lab 12/25/23 2002  LATICACIDVEN 0.7    Recent Results (from the past 240 hours)  Stool culture     Status: None   Collection Time: 12/25/23  4:06 PM   Specimen: Stool  Result Value Ref Range Status   Salmonella/Shigella Screen DUPLICATE  Final    Comment: (NOTE) Duplicate procedure ordered. Please see [phone] for results.    Campylobacter Culture NOT PERFORMED  Final    Comment: Test not  performed   E coli, Shiga toxin Assay NOT PERFORMED  Corrected    Comment: (NOTE) Test not performed Performed At: [facility] [address] [place], [place] [id number] Jolene Schimke MD GE:[id number] CORRECTED ON 03/04 AT 1136: PREVIOUSLY REPORTED AS Negative Reference range: Negative   C Difficile Quick Screen w PCR reflex     Status: None   Collection Time: 12/25/23  4:06 PM   Specimen: Stool  Result Value Ref Range Status   C Diff antigen NEGATIVE NEGATIVE Final  C Diff toxin NEGATIVE NEGATIVE Final   C Diff interpretation No C. difficile detected.  Final    Comment: Performed at [facility], [address], [place], [place] [address]  Urine Culture (for pregnant, neutropenic or urologic patients or patients with an indwelling urinary catheter)     Status: Abnormal   Collection Time: 12/25/23  5:37 PM   Specimen: Urine, Clean Catch  Result Value Ref Range Status   Specimen Description   Final    URINE, CLEAN CATCH Performed at [facility], [address], [place], [place] [address]    Special Requests   Final    NONE Performed at [facility], [address], [place], [place] [address]    Culture (A)  Final    >=100,000 COLONIES/mL PROTEUS MIRABILIS Susceptibility Pattern Suggests Possibility of an Extended Spectrum Beta Lactamase Producer. Contact Laboratory Within 7 Days if Confirmation Warranted. Performed at [facility], [address], [place], [place] [address]    Report Status 12/28/2023 FINAL  Final   Organism ID, Bacteria PROTEUS MIRABILIS (A)  Final      Susceptibility   Proteus mirabilis - MIC*    AMPICILLIN >=32 RESISTANT Resistant     CEFAZOLIN >=64 RESISTANT Resistant     CEFEPIME 4 INTERMEDIATE Intermediate     CEFTRIAXONE 8 RESISTANT Resistant     CIPROFLOXACIN >=4 RESISTANT Resistant     GENTAMICIN <=1 SENSITIVE Sensitive     IMIPENEM 8 INTERMEDIATE Intermediate     NITROFURANTOIN 128 RESISTANT Resistant     TRIMETH/SULFA >=320  RESISTANT Resistant     AMPICILLIN/SULBACTAM >=32 RESISTANT Resistant     PIP/TAZO 64 INTERMEDIATE Intermediate ug/mL    * >=100,000 COLONIES/mL PROTEUS MIRABILIS  Culture, blood (routine x 2)     Status: None (Preliminary result)   Collection Time: 12/25/23  8:02 PM   Specimen: BLOOD RIGHT ARM  Result Value Ref Range Status   Specimen Description BLOOD RIGHT ARM  Final   Special Requests   Final    BOTTLES DRAWN AEROBIC AND ANAEROBIC Blood Culture adequate volume   Culture   Final    NO GROWTH 3 DAYS Performed at [facility], [address], [place], [place] [address]    Report Status PENDING  Incomplete  Culture, blood (routine x 2)     Status: None (Preliminary result)   Collection Time: 12/25/23  8:16 PM   Specimen: Left Antecubital; Blood  Result Value Ref Range Status   Specimen Description LEFT ANTECUBITAL  Final   Special Requests   Final    BOTTLES DRAWN AEROBIC AND ANAEROBIC Blood Culture adequate volume   Culture   Final    NO GROWTH 3 DAYS Performed at [facility], [address], [place], [place] [address]    Report Status PENDING  Incomplete  Resp panel by RT-PCR (RSV, Flu A&B, Covid) Anterior Nasal Swab     Status: None   Collection Time: 12/25/23  8:35 PM   Specimen: Anterior Nasal Swab  Result Value Ref Range Status   SARS Coronavirus 2 by RT PCR NEGATIVE NEGATIVE Final    Comment: (NOTE) SARS-CoV-2 target nucleic acids are NOT DETECTED.  The SARS-CoV-2 RNA is generally detectable in upper respiratory specimens during the acute phase of infection. The lowest concentration of SARS-CoV-2 viral copies this assay can detect is 138 copies/mL. A negative result does not preclude SARS-Cov-2 infection and should not be used as the sole basis for treatment or other patient management decisions. A negative  result may occur with  improper specimen collection/handling, submission of specimen other than nasopharyngeal swab, presence of viral mutation(s) within  the areas targeted by this assay, and inadequate number of viral copies(<138 copies/mL). A negative result must be combined with clinical observations, patient history, and epidemiological information. The expected result is Negative.  Fact Sheet for Patients:  BloggerCourse.com  Fact Sheet for Healthcare Providers:  SeriousBroker.it  This test is no t yet approved or cleared by the [place] FDA and  has been authorized for detection and/or diagnosis of SARS-CoV-2 by FDA under an Emergency Use Authorization (EUA). This EUA will remain  in effect (meaning this test can be used) for the duration of the COVID-19 declaration under Section 564(b)(1) of the Act, 21 U.S.C.section 360bbb-3(b)(1), unless the authorization is terminated  or revoked sooner.       Influenza A by PCR NEGATIVE NEGATIVE Final   Influenza B by PCR NEGATIVE NEGATIVE Final    Comment: (NOTE) The Xpert Xpress SARS-CoV-2/FLU/RSV plus assay is intended as an aid in the diagnosis of influenza from Nasopharyngeal swab specimens and should not be used as a sole basis for treatment. Nasal washings and aspirates are unacceptable for Xpert Xpress SARS-CoV-2/FLU/RSV testing.  Fact Sheet for Patients: BloggerCourse.com  Fact Sheet for Healthcare Providers: SeriousBroker.it  This test is not yet approved or cleared by the [place] FDA and has been authorized for detection and/or diagnosis of SARS-CoV-2 by FDA under an Emergency Use Authorization (EUA). This EUA will remain in effect (meaning this test can be used) for the duration of the COVID-19 declaration under Section 564(b)(1) of the Act, 21 U.S.C. section 360bbb-3(b)(1), unless the authorization is terminated or revoked.     Resp Syncytial Virus by PCR NEGATIVE NEGATIVE Final    Comment: (NOTE) Fact Sheet for  Patients: BloggerCourse.com  Fact Sheet for Healthcare Providers: SeriousBroker.it  This test is not yet approved or cleared by the [place] FDA and has been authorized for detection and/or diagnosis of SARS-CoV-2 by FDA under an Emergency Use Authorization (EUA). This EUA will remain in effect (meaning this test can be used) for the duration of the COVID-19 declaration under Section 564(b)(1) of the Act, 21 U.S.C. section 360bbb-3(b)(1), unless the authorization is terminated or revoked.  Performed at [facility], [address] [place], [place] [address]      Radiology Studies: No results found.  Scheduled Meds:  ARIPiprazole  5 mg Oral Daily   bictegravir-emtricitabine-tenofovir AF  1 tablet Oral Daily   donepezil  5 mg Oral Daily   And   donepezil  10 mg Oral QHS   enoxaparin (LOVENOX) injection  30 mg Subcutaneous Q24H   mirabegron ER  50 mg Oral Daily   mirtazapine  15 mg Oral QHS   pantoprazole  40 mg Oral Daily   venlafaxine XR  75 mg Oral Daily   Continuous Infusions:   LOS: 3 days   Time spent: 40 minutes  Carollee Herter, DO  [facility]  12/28/2023, 11:48 AM

## 2023-12-28 NOTE — Progress Notes (Signed)
 Spoke with Brynda Greathouse, LPN at [facility] and provided report.

## 2023-12-28 NOTE — Progress Notes (Signed)
   Gave family update at bedside.   Carollee Herter, DO [facility]

## 2023-12-30 LAB — CULTURE, BLOOD (ROUTINE X 2)
Special Requests: ADEQUATE
Special Requests: ADEQUATE

## 2023-12-30 LAB — STOOL CULTURE: E coli, Shiga toxin Assay: NEGATIVE

## 2023-12-30 LAB — STOOL CULTURE REFLEX - CMPCXR

## 2023-12-30 LAB — STOOL CULTURE REFLEX - RSASHR

## 2024-02-07 ENCOUNTER — Ambulatory Visit: Payer: Medicare (Managed Care) | Admitting: Urology

## 2024-02-22 ENCOUNTER — Other Ambulatory Visit (INDEPENDENT_AMBULATORY_CARE_PROVIDER_SITE_OTHER): Payer: Self-pay

## 2024-02-22 ENCOUNTER — Encounter: Payer: Self-pay | Admitting: Orthopedic Surgery

## 2024-02-22 ENCOUNTER — Ambulatory Visit (INDEPENDENT_AMBULATORY_CARE_PROVIDER_SITE_OTHER): Payer: Medicare (Managed Care) | Admitting: Orthopedic Surgery

## 2024-02-22 DIAGNOSIS — S82141A Displaced bicondylar fracture of right tibia, initial encounter for closed fracture: Secondary | ICD-10-CM

## 2024-02-22 NOTE — Progress Notes (Signed)
 New Patient Visit  Assessment: [name] is a 83 y.o. female with the following: 1. Closed fracture of right tibial plateau, initial encounter  Plan: [name] fell out of bed almost 2 months ago, and injured her right knee.  She was diagnosed with a lateral tibial plateau fracture.  Repeat radiographs obtained today demonstrate stable alignment.  She does not walk.  She has not walked in years.  Nonetheless, at this point, she can continue with range of motion as tolerated.  No brace is needed.  She can bear weight for transfers.  If she has any further issues, I am happy to see her in clinic.  If she continues to have pain, we could consider knee injection,  Follow-up: Return if symptoms worsen or fail to improve.  Subjective:  Chief Complaint  Patient presents with   right knee pain    Minimally displaced lateral tibial plateau fracture, date of injury December 25, 2023    History of Present Illness: [name] is a 83 y.o. female who presents for evaluation of right knee pain.  Approximately 2 months ago, she fell out of bed.  She injured her right knee.  She was evaluated in the emergency department.  Radiographs were obtained, which demonstrates a minimally displaced fracture of the lateral tibial plateau.  She lives in a facility.  She does not walk.  She has not walked in years.  She continues to have pain in the right knee.  She states that she previously had an injury to the right knee, greater than 40 years ago, which required a cast.  She also has some pain and swelling in the right foot.  Radiographs at the time of the injury were negative for acute fracture.   Review of Systems: No fevers or chills No numbness or tingling No chest pain No shortness of breath No bowel or bladder dysfunction No GI distress No headaches   Medical History:  Past Medical History:  Diagnosis Date   Anxiety    Arthritis    Bronchitis    Bronchitis    CKD (chronic kidney  disease) stage 3, GFR 30-59 ml/min (HCC) 05/04/2016   Colostomy in place [facility]) 09/12/2020   Complication of anesthesia    pt states she had a cardiac arrest during hysterectomy 1985.  Pt had several surgies since then that were uneventful   Confusion 01/05/2022   COPD (chronic obstructive pulmonary disease) (HCC)    Dementia (HCC) 01/05/2022   Dementia with behavioral disturbance (HCC) 05/31/2023   Depression    Dizziness 01/05/2022   Dysuria 03/27/2019   Elevated LFTs 05/04/2016   History of TIAs 1981   left side weakness   HIV (human immunodeficiency virus infection) (HCC)    HIV infection with neurological disease (HCC) 05/25/2022   Hypertension    ISCHEMIC COLITIS 12/27/2006   Qualifier: Diagnosis of   By: Verdia Glad MD, Gaylin Ke      Replacing diagnoses that were inactivated after the 01/25/23 regulatory import     Kidney disease related to HIV infection [facility])    pt states she has to have her creatinine level checked often    Large bowel perforation (HCC) 11/12/2018   Low blood pressure 01/05/2022   Moderate single current episode of major depressive disorder (HCC) 03/12/2021   Nausea 03/03/2017   Pelvic pain 05/25/2022   Peripheral neuropathy    bilateral feet   Seasonal allergies 03/27/2019   Sinusitis 05/06/2018   Stercoral ulcer of large intestine  11/12/2018   Steroid-induced psychosis, with hallucinations (HCC)    UTI (urinary tract infection) 03/03/2017   Weight loss 03/03/2017    Past Surgical History:  Procedure Laterality Date   ABDOMINAL HYSTERECTOMY  1985   BACK SURGERY     BIOPSY  07/15/2020   Procedure: BIOPSY;  Surgeon: Suzette Espy, MD;  [facility];  Service: Endoscopy;;   CATARACT EXTRACTION, BILATERAL     CHOLECYSTECTOMY     COLONOSCOPY  08/10/2002   NUR: Normal colonoscopy except for some hemorrhoids and some erythema at the dentate line   COLONOSCOPY  12/10/2006   Edwards:Diffuse colitis in the transverse and descending colon/This is not  entirely typical of ischemic colitis or her HIV positive/ disease.  We need to be concerned about other causes   COLONOSCOPY  01/19/2011   RMR: Internal and external hemorrhoids, likely source of hematochezia anal papilla, otherwise normal rectal mucosa/ Left-sided diverticula.  Cecal polyp with status post cold snare polypectomy.  Remainder of colonic mucosa appeared normal. Pathology with tubular adenoma. Repeat in 2017   COLONOSCOPY WITH PROPOFOL  N/A 07/15/2020   Procedure: COLONOSCOPY WITH PROPOFOL ;  Surgeon: Suzette Espy, MD;  [facility];  Service: Endoscopy;  Laterality: N/A;  8:15AM TCS via Ostomy   COLOSTOMY N/A 11/12/2018   Procedure: COLOSTOMY;  Surgeon: Awilda Bogus, MD;  Location: [facility];  Service: General;  Laterality: N/A;   COLOSTOMY REVERSAL N/A 10/02/2020   Procedure: COLOSTOMY REVERSAL;  Surgeon: Awilda Bogus, MD;  Location: [facility];  Service: General;  Laterality: N/A;   ESOPHAGOGASTRODUODENOSCOPY  08/10/2002     NUR: Mild changes of reflux esophagitis limited to gastroesophageal junction  No evidence of ring, stricture, or esophageal candidiasis dysphagia/ Gastritis, possibly H. pylori induced   ESOPHAGOGASTRODUODENOSCOPY  12/25/2002   NUR: Erosive antral gastritis.  The degree of gastritis is more significant than her last exam/ Normal examination of the esophagus/ Esophageal dilatation performed by passing 56 and [address] [place]   ESOPHAGOGASTRODUODENOSCOPY  11/13/2010   RMR: Circumferential distal esophageal erosions with soft peptic stricture, consistent with erosive reflux esophagitis or stricture  formation, status post dilation as described above/  Small hiatal hernia/ Tiny antral erosions, otherwise normal stomach D1 and D2   ESOPHAGOGASTRODUODENOSCOPY (EGD) WITH PROPOFOL  N/A 07/15/2020   Procedure: ESOPHAGOGASTRODUODENOSCOPY (EGD) WITH PROPOFOL ;  Surgeon: Suzette Espy, MD;  [facility];  Service: Endoscopy;  Laterality: N/A;    FLEXIBLE SIGMOIDOSCOPY N/A 07/15/2020   Procedure: FLEXIBLE SIGMOIDOSCOPY;  Surgeon: Suzette Espy, MD;  [facility];  Service: Endoscopy;  Laterality: N/A;   HEMORRHOID SURGERY  09/02/2012   Procedure: HEMORRHOIDECTOMY;  Surgeon: Beau Bound, MD;  Location: [facility];  Service: General;  Laterality: N/A;   LAPAROTOMY N/A 11/12/2018   Procedure: EXPLORATORY LAPAROTOMY;  Surgeon: Awilda Bogus, MD;  Location: [facility];  Service: General;  Laterality: N/A;   MALONEY DILATION N/A 07/15/2020   Procedure: Dorothyann Gather;  Surgeon: Suzette Espy, MD;  [facility];  Service: Endoscopy;  Laterality: N/A;   PARTIAL COLECTOMY N/A 11/12/2018   Procedure: PARTIAL COLECTOMY;  Surgeon: Awilda Bogus, MD;  Location: [facility];  Service: General;  Laterality: N/A;   POLYPECTOMY  07/15/2020   Procedure: POLYPECTOMY;  Surgeon: Suzette Espy, MD;  [facility];  Service: Endoscopy;;   TUBAL LIGATION      Family History  Problem Relation Age of Onset   Diabetes Mother  Diabetes Brother    Colon cancer Brother        Diagnosed > age 21   Social History   Tobacco Use   Smoking status: Never   Smokeless tobacco: Never  Vaping Use   Vaping status: Never Used  Substance Use Topics   Alcohol  use: No   Drug use: No    Allergies  Allergen Reactions   Bee Venom Anaphylaxis   Promethazine Swelling   Tenofovir  Disoproxil Fumarate [Tenofovir  Disoproxil Fumarate] Other (See Comments)    Renal failure, (08/27/2006)   Compazine  [Prochlorperazine Edisylate] Nausea And Vomiting   Haloperidol  Lactate Other (See Comments)    Reaction is muscle tension, causes severe spasms in face and neck. Forces eyes to roll in the back of the head   Sulfa  Antibiotics Diarrhea   Brompheniramine-Acetaminophen  Other (See Comments)    unknown   Chlorcyclizine Other (See Comments)    Unknown   Chlorpromazine Other (See Comments)    Unknown   Codeine Itching and Nausea And Vomiting    Navane [Thiothixene (Tiotixene)] Other (See Comments) and Nausea And Vomiting    Same muscle spasm reaction as haldol    Prednisone  Other (See Comments)    Brief mild psychosis and agitation   Promethazine Hcl Other (See Comments)   Propoxyphene N-Acetaminophen  Itching and Nausea And Vomiting   Morphine  Itching and Swelling    Current Meds  Medication Sig   ALLERGY RELIEF 10 MG tablet Take 10 mg by mouth daily.   amLODipine  (NORVASC ) 5 MG tablet Take 1 tablet (5 mg total) by mouth daily.   ARIPiprazole  (ABILIFY ) 5 MG tablet Take 5 mg by mouth daily.   atorvastatin  (LIPITOR) 20 MG tablet Take 20 mg by mouth daily.   bictegravir-emtricitabine -tenofovir  AF (BIKTARVY ) 50-200-25 MG TABS tablet Take 1 tablet by mouth daily. Please discontinue TRIUMEQ  and TIVICAY  (Patient taking differently: Take 1 tablet by mouth daily.)   clonazePAM  (KLONOPIN ) 0.5 MG tablet Take 1 tablet (0.5 mg total) by mouth 2 (two) times daily as needed for anxiety.   docusate sodium  (COLACE) 100 MG capsule Take 1 capsule (100 mg total) by mouth 2 (two) times daily.   donepezil  (ARICEPT ) 5 MG tablet Take 5-10 mg by mouth in the morning and at bedtime. 5mg  in the morning and 10mg  at bedtime   furosemide  (LASIX ) 40 MG tablet Take 1 tablet (40 mg total) by mouth daily as needed for fluid or edema. Hold until follow up with PCP   ipratropium-albuterol  (DUONEB) 0.5-2.5 (3) MG/3ML SOLN Inhale 3 mLs into the lungs every 6 (six) hours as needed.   mirabegron  ER (MYRBETRIQ ) 50 MG TB24 tablet Take 1 tablet (50 mg total) by mouth daily.   mirtazapine  (REMERON ) 7.5 MG tablet Take 15 mg by mouth at bedtime.   Multiple Vitamin (ONE-A-DAY 55 PLUS PO) Take 1 tablet by mouth daily.   ondansetron  (ZOFRAN ) 4 MG tablet Take 1 tablet (4 mg total) by mouth every 6 (six) hours as needed for nausea.   pantoprazole  (PROTONIX ) 20 MG tablet Take 20 mg by mouth daily.   polyethylene glycol (MIRALAX ) 17 g packet Take 17 g by mouth daily as needed for  mild constipation. Hold for diarrhea   polyvinyl alcohol  (LIQUIFILM TEARS) 1.4 % ophthalmic solution Place 1 drop into both eyes daily.   thiamine  (VITAMIN B-1) 100 MG tablet Take 100 mg by mouth daily.   Venlafaxine  HCl 75 MG TB24 Take 75 mg by mouth daily.    Objective: There were no vitals  taken for this visit.  Physical Exam:  General: Alert and oriented., No acute distress., and Seated in a wheelchair. Gait: Unable to ambulate.  The right knee demonstrates a mild deformity.  Mild swelling.  No specific point tenderness.  She is able to achieve full extension.  She tolerates flexion to approximately 90 degrees.  Right foot is swollen.  There is no redness.  No point tenderness equinus positioning of the ankle.  Unable to get to a plantigrade position.  This is consistent with contralateral side.    IMAGING: I personally ordered and reviewed the following images   X-rays of the right knee were obtained in clinic today.  No acute injuries noted.  Joint line is maintained.  There is a small cortical defect in the lateral, proximal tibia.  No depression of the lateral tibial plateau.  Mild varus alignment.  No bony lesions.  Impression: Stable lateral tibial plateau fracture, without further subsidence   New Medications:  No orders of the defined types were placed in this encounter.     Tonita Frater, MD  02/22/2024 9:47 AM

## 2024-03-22 ENCOUNTER — Ambulatory Visit: Admitting: Urology

## 2024-04-27 ENCOUNTER — Ambulatory Visit (INDEPENDENT_AMBULATORY_CARE_PROVIDER_SITE_OTHER): Payer: Medicare (Managed Care) | Admitting: Urology

## 2024-04-27 VITALS — BP 102/69 | HR 71

## 2024-04-27 DIAGNOSIS — Z87440 Personal history of urinary (tract) infections: Secondary | ICD-10-CM

## 2024-04-27 DIAGNOSIS — N390 Urinary tract infection, site not specified: Secondary | ICD-10-CM

## 2024-04-27 DIAGNOSIS — N3941 Urge incontinence: Secondary | ICD-10-CM

## 2024-04-27 DIAGNOSIS — N3281 Overactive bladder: Secondary | ICD-10-CM | POA: Diagnosis not present

## 2024-04-27 MED ORDER — MIRABEGRON ER 50 MG PO TB24
50.0000 mg | ORAL_TABLET | Freq: Every day | ORAL | 11 refills | Status: AC
Start: 2024-04-27 — End: ?

## 2024-04-27 NOTE — Patient Instructions (Signed)
 UTI prevention / management:  UTI symptoms may include:  - Pain / burning / discomfort when urinating - Recent increase in urinary urgency (how quickly you feel like you need to rush to the bathroom) - Recent increase in urinary frequency (how often you are urinating) - Fever - Acute mental status change / confusion - Fatigue / Feeling tired - Weakness - Note: Urine color, clarity, and odor are not considered to be clinically significant indicators of UTI and do not warrant urine testing unless patient is also experiencing UTI symptoms such as those listed above.  Difference between Urinalysis (urine dipstick test) and Urine culture / Why urine culture often needed to determine appropriate diagnosis and treatment of urologic symptoms: > Urinalysis (urine dipstick test): A quick office test used as an indicator to determine whether or not further testing is necessary (such as a urine culture, urine microscopy, etc.) The urinalysis cannot differentiate a true bacterial UTI or give a definitive diagnosis for the findings.  > Urine culture: May be performed based on the findings of a urinalysis to evaluate for UTI. Grows out on a petri dish for 48-72 hours. Provides important information about: whether or not bacterial growth is present and if so: what the predominant bacteria is which antibiotics will work best against that bacteria That information is important so that we can diagnose and treat patients appropriately as there are other conditions which may mimic UTls which must not be missed (such as cancer, interstitial cystitis, stones, etc.). Assists us  with antibiotic stewardship to minimize patient's risk for developing antibiotic resistance (getting to a point where no antibiotics work anymore).  Options when UTI symptoms occur: 1. Call [facility] [place] and request to speak with triage nurse (phone # [phone], select option 3). In accordance with clinic guidelines  the nurse will determine next steps based on patient-reported symptoms, which may include: same-day lab visit to provide urine specimen, recommendation to schedule Urology office visit appointment for further evaluation, recommendation to proceed to ER, etc. 2. Call your Primary Care Provider (PCP) office to request urgent / same-day visit. Be sure to request for urine culture to be ordered and have results faxed to Urology (fax # [phone]).  3. Go to urgent care. Be sure to request for urine culture to be ordered and have results faxed to Urology (fax # [phone]).   For bladder pain/ burning with urination: - Can take over-the-counter Pyridium (phenazopyridine; commonly known under the AZO brand) for a few days as needed. Limit use to no more than 3 days consecutively due to risk for methemoglobinemia, liver function issues, and bone health damage with long term use of Pyridium. - Alternative: Prescription urinary analgesics (such as Uribel, Urogesic blue, Urelle, Uro-MP). Often expensive / poorly covered by insurance unfortunately.  Options / recommendations for UTI prevention: - Low dose antibiotic daily for UTI prophylaxis. - Topical vaginal estrogen for vaginal atrophy (aka Genitourinary Syndrome of Menopause (GSM)). - Adequate daily fluid intake to flush out the urinary tract. - Go to the bathroom to urinate every 4-6 hours while awake to minimize urinary stasis / bacterial overgrowth in the bladder. - Proanthocyanidin (PAC) supplement 36 mg daily; must be soluble (insoluble form of PAC will be ineffective). Recommended brand: Ellura. This is an over-the-counter supplement (often must be found/ purchased online) supplement derived from cranberries with concentrated active component: Proanthocyanidin (PAC) 36 mg daily. Decreases bacterial adherence to bladder lining.  - D-mannose powder (2 grams daily). This is an over-the-counter supplement  which decreases bacterial adherence to bladder  lining (it is a sugar that inhibits bacterial adherence to urothelial cells by binding to the pili of enteric bacteria). Take as per manufacturer recommendation. Can be used as an alternative or in addition to the concentrated cranberry supplement.  - Vitamin C supplement to acidify urine to minimize bacterial growth.  - Probiotic to maintain healthy vaginal microbiome to suppress bacteria at urethral opening. Brand recommendations: Estill Hemming (includes probiotic & D-mannose ), Feminine Balance (highest concentration of lactobacillus) or Hyperbiotic Pro 15.  Note for patients with diabetes:  - Be aware that D-mannose contains sugar.  Note for patients with interstitial cystitis (IC):  - Patients with IC should typically avoid cranberry/ PAC supplements and Vitamin C supplements due to their acidity, which may exacerbate IC-related bladder pain. - Symptoms of true bacterial UTI can overlap / mimic symptoms of an IC flare up. Antibiotic use is NOT indicated for IC flare ups. Urine culture needed prior to antibiotic treatment for IC patients. The goal is to minimize your risk for developing antibiotic-resistant bacteria.    Vaginal atrophy I Genitourinary syndrome of menopause (GSM):  What it is: Changes in the vaginal environment (including the vulva and urethra) including: Thinning of the epithelium (skin/ mucosa surface) Can contribute to urinary urgency and frequency Can contribute to dryness, itching, irritation of the vulvar and vaginal tissue Can contribute to pain with intercourse Can contribute to physical changes of the labia, vulva, and vagina such as: Narrowing of the vaginal opening Decreased vaginal length Loss of labial architecture Labial adhesions Pale color of vulvovaginal tissue Loss of pubic hair Allows bacteria to become adherent Results in increased risk for urinary tract infection (UTI) due to bacterial overgrowth and migration up the urethra into the bladder Change in  vaginal pH (acid/ base balance) Allows for alteration / disruption of the normal bacterial flora / microbiome Results in increased risk for urinary tract infection (UTI) due to bacterial overgrowth  Treatment options: Over-the-counter lubricants (see list below). Prescription vaginal estrogen replacement. Options: Topical vaginal estrogen (estradiol ) cream: (Estrace , Premarin, compounded) Apply as directed Estring  vaginal ring Exchanged every 3 months (either at home or in office by provider) Vagifem  vaginal tablet Inserted nightly for 2 weeks then twice a week (long term) lntrarosa vaginal suppository Vaginal DHEA: converts to estrogen in vaginal tissue without systemic effect Inserted nightly (long term) 3. Vaginal laser therapy (Mona Edwina Gram touch) Performed in 3 treatments each 6 weeks apart (available in our [place] office). Can feel like a sunburn for 3-4 days after each treatment until new skin heals in. Usually not covered by insurance. Estimated cost is $1500 for all 3 sessions.  FYI regarding prescription vaginal estrogen treatment options: - All topical vaginal estrogen replacement options are equivalent in terms of efficacy. - Topical vaginal estrogen replacement will take about 3 months to be effective. - OK to have sex with any of the topical vaginal estrogen replacement options. - Topical vaginal estrogen replacement may sting/burn initially due to severe dryness, which will improve with ongoing treatment. - Studies have demonstrated negligible systemic absorption of low-dose intravaginal estrogen cream therefore the risk for cancer development or recurrence with this medication is minimal.  Genitourinary Syndrome of Menopause: AUA/SUFU/AUGS Guideline (2025) [employer]  Topical vaginal estrogen cream safe to use with breast cancer  history [employer]Ins[employer].[email or web address]  Topical vaginal estrogen cream safe to use with blood clot history [email or web address]   Lubricants and Moisturizers for Treating Genitourinary Syndrome of Menopause and Vulvovaginal Atrophy Treatment Comments I  Available Products   Lubricants   Water-based Ingredients: Deionized water, glycerin, propylene glycol; latex safe; rare irritation; dry out with extended sexual activity Astroglide, Good Clean Love, K-Y Jelly, Natural, Organic, Pink, Sliquid, Sylk, Yes    Oil Based Ingredients: avocado, olive, peanut, corn; latex safe; can be used with silicone products; staining; safe (unless peanut allergy); non-irritating Coconut oil, vegetable oil, vitamin E oil  Silicone-Based Ingredients: Silicone polymers; staining; typically nonirritating, long lasting; waterproof; should not be used with silicone dilators, sexual toys, or gynecologic products Astroglide X, Oceanus Ultra Pure, Pink Silicone, Pjur Eros, Replens Silky Smooth, Silicone Premium JO, SKYN, Uberlube, [employer] Based Minimize harm to sperm motility; designed for couples trying to conceive:  Astroglide TTC, Conceive Plus, PreSeed, Yes Baby  Fertility Friendly Minimize harm to sperm motility; designed for couples trying to conceive:  Astroglide, TTC, Conceive Plus, PreSeed, Yes Baby  Vaginal Moisturizers   Vaginal Moisturizers For maintenance use 1 to 3 times weekly; can benefit women with dryness, chafing with AOL, and recurrent vaginal infections irrespective of sexual activity timing Balance Active Menopause Vaginal Moisturizing Lubricant, Canesintima Intimate Moisturizer, Replens, Rephresh, Sylk Natural Intimate Moisturizer, Yes Vaginal  Moisturizer  Hybrids Properties of both water and silicone-based products (combination of a vaginal lubricant and moisturizer); Non-irritating; good option for women with allergies and sensitivities Lubrigyn, Luvena  Suppositories Hyaluronic acid to retain moisture Revaree  Vulvar Soothing Creams/Oils    Medicated Creams Pain and burn relief; Ingredients: 4% Lidocaine, Aloe Vera gel Releveum ([place])  Non-Medicated Creams For anti-itch and moisture/maintenance; Ingredients: Coconut oil, Avocado oil, Shea Butter, Olive oil, Vitamin E Vajuvenate, Vmagic  Oils for moisture/maintenance: Coconut oil, Vitamin E oil, Emu oil

## 2024-04-27 NOTE — Progress Notes (Signed)
 Name: [name] DOB: 05-16-1941 MRN: [id number]  History of Present Illness: Ms. [name] is a 83 y.o. female who presents today for follow up visit at [facility] [place]. She resides at [facility] and is accompanied by Manus Crimes, CNA (transport only). Patient is a poor historian due to dementia. Relevant history includes: 1. Recurrent UTI. Takes Keflex  (Cephalexin ) 250 mg daily for UTI prophylaxis. 2. OAB with urinary frequency, nocturia, urgency, and urge incontinence. Takes Myrbetriq  (Mirabegron ) 50 mg daily.  Urine culture results in past 12 months: - 09/16/2023: Positive for Proteus mirabilis - 12/25/2023: Positive for Enterococcus faecalis  At last visit with Dr. Sherrilee on 08/09/2023: Stable. The plan was to continue Keflex  (Cephalexin ) 250 mg daily for UTI prophylaxis or Myrbetriq  (Mirabegron ) 50 mg daily for OAB with UUI.  Since last visit: During hospital admission in March 2025 she was diagnosed with UTI. Urine culture on 12/28/2023 grew ESBL Proteus mirabilis. Treated with Zosyn  then Gentamicin . It appears that the Keflex  (Cephalexin ) 250 mg daily for UTI prophylaxis was discontinued / not resumed upon discharge, however Myrbetriq  (Mirabegron ) 50 mg daily was continued - unclear  when or why Myrbetriq  was switched to Ditropan.  Today: According to her current medication list: - She has been taking Ditropan (Oxybutynin) 10 mg daily since 12/31/2023. - Not currently taking Keflex  (Cephalexin ) 250 mg daily for UTI prophylaxis or Myrbetriq  (Mirabegron ) 50 mg daily.  She reports 0 UTIs since last visit. She denies dysuria or abdominal pain.  Medications: Current Outpatient Medications  Medication Sig Dispense Refill   ALLERGY RELIEF 10 MG tablet Take 10 mg by mouth daily.     amLODipine  (NORVASC ) 5 MG tablet Take 1 tablet (5 mg total) by mouth daily.     ARIPiprazole  (ABILIFY ) 5 MG tablet Take 5 mg by mouth  daily.     atorvastatin  (LIPITOR) 20 MG tablet Take 20 mg by mouth daily.     bictegravir-emtricitabine -tenofovir  AF (BIKTARVY ) 50-200-25 MG TABS tablet Take 1 tablet by mouth daily. Please discontinue TRIUMEQ  and TIVICAY  (Patient taking differently: Take 1 tablet by mouth daily.) 30 tablet 11   clonazePAM  (KLONOPIN ) 0.5 MG tablet Take 1 tablet (0.5 mg total) by mouth 2 (two) times daily as needed for anxiety. 10 tablet 0   docusate sodium  (COLACE) 100 MG capsule Take 1 capsule (100 mg total) by mouth 2 (two) times daily. 10 capsule 0   donepezil  (ARICEPT ) 5 MG tablet Take 5-10 mg by mouth in the morning and at bedtime. 5mg  in the morning and 10mg  at bedtime     furosemide  (LASIX ) 40 MG tablet Take 1 tablet (40 mg total) by mouth daily as needed for fluid or edema. Hold until follow up with PCP     ipratropium-albuterol  (DUONEB) 0.5-2.5 (3) MG/3ML SOLN Inhale 3 mLs into the lungs every 6 (six) hours as needed.     mirtazapine  (REMERON ) 7.5 MG tablet Take 15 mg by mouth at bedtime.     Multiple Vitamin (ONE-A-DAY 55 PLUS PO) Take 1 tablet by mouth daily.     ondansetron  (ZOFRAN ) 4 MG tablet Take 1 tablet (4 mg total) by mouth every 6 (six) hours as needed for nausea.     pantoprazole  (PROTONIX ) 20 MG tablet Take 20 mg by mouth daily.     polyethylene glycol (MIRALAX ) 17 g packet Take 17 g by mouth daily as needed for mild constipation. Hold for diarrhea     polyvinyl alcohol  (LIQUIFILM TEARS) 1.4 %  ophthalmic solution Place 1 drop into both eyes daily.     thiamine  (VITAMIN B-1) 100 MG tablet Take 100 mg by mouth daily.     Venlafaxine  HCl 75 MG TB24 Take 75 mg by mouth daily.     mirabegron  ER (MYRBETRIQ ) 50 MG TB24 tablet Take 1 tablet (50 mg total) by mouth daily. 30 tablet 11   No current facility-administered medications for this visit.    Allergies: Allergies  Allergen Reactions   Bee Venom Anaphylaxis   Promethazine Swelling   Tenofovir  Disoproxil Fumarate [Tenofovir  Disoproxil  Fumarate] Other (See Comments)    Renal failure, (08/27/2006)   Compazine  [Prochlorperazine Edisylate] Nausea And Vomiting   Haloperidol  Lactate Other (See Comments)    Reaction is muscle tension, causes severe spasms in face and neck. Forces eyes to roll in the back of the head   Sulfa  Antibiotics Diarrhea   Brompheniramine-Acetaminophen  Other (See Comments)    unknown   Chlorcyclizine Other (See Comments)    Unknown   Chlorpromazine Other (See Comments)    Unknown   Codeine Itching and Nausea And Vomiting   Navane [Thiothixene (Tiotixene)] Other (See Comments) and Nausea And Vomiting    Same muscle spasm reaction as haldol    Prednisone  Other (See Comments)    Brief mild psychosis and agitation   Promethazine Hcl Other (See Comments)   Propoxyphene N-Acetaminophen  Itching and Nausea And Vomiting   Morphine  Itching and Swelling    Past Medical History:  Diagnosis Date   Anxiety    Arthritis    Bronchitis    Bronchitis    CKD (chronic kidney disease) stage 3, GFR 30-59 ml/min (HCC) 05/04/2016   Colostomy in place [facility]) 09/12/2020   Complication of anesthesia    pt states she had a cardiac arrest during hysterectomy 1985.  Pt had several surgies since then that were uneventful   Confusion 01/05/2022   COPD (chronic obstructive pulmonary disease) (HCC)    Dementia (HCC) 01/05/2022   Dementia with behavioral disturbance (HCC) 05/31/2023   Depression    Dizziness 01/05/2022   Dysuria 03/27/2019   Elevated LFTs 05/04/2016   History of TIAs 1981   left side weakness   HIV (human immunodeficiency virus infection) (HCC)    HIV infection with neurological disease (HCC) 05/25/2022   Hypertension    ISCHEMIC COLITIS 12/27/2006   Qualifier: Diagnosis of   By: Lang MD, [place]      Replacing diagnoses that were inactivated after the 01/25/23 regulatory import     Kidney disease related to HIV infection [facility])    pt states she has to have her creatinine level checked often    Large  bowel perforation (HCC) 11/12/2018   Low blood pressure 01/05/2022   Moderate single current episode of major depressive disorder (HCC) 03/12/2021   Nausea 03/03/2017   Pelvic pain 05/25/2022   Peripheral neuropathy    bilateral feet   Seasonal allergies 03/27/2019   Sinusitis 05/06/2018   Stercoral ulcer of large intestine 11/12/2018   Steroid-induced psychosis, with hallucinations (HCC)    UTI (urinary tract infection) 03/03/2017   Weight loss 03/03/2017   Past Surgical History:  Procedure Laterality Date   ABDOMINAL HYSTERECTOMY  1985   BACK SURGERY     BIOPSY  07/15/2020   Procedure: BIOPSY;  Surgeon: Shaaron Lamar HERO, MD;  [facility];  Service: Endoscopy;;   CATARACT EXTRACTION, BILATERAL     CHOLECYSTECTOMY     COLONOSCOPY  08/10/2002   NUR: Normal colonoscopy  except for some hemorrhoids and some erythema at the dentate line   COLONOSCOPY  12/10/2006   Edwards:Diffuse colitis in the transverse and descending colon/This is not entirely typical of ischemic colitis or her HIV positive/ disease.  We need to be concerned about other causes   COLONOSCOPY  01/19/2011   RMR: Internal and external hemorrhoids, likely source of hematochezia anal papilla, otherwise normal rectal mucosa/ Left-sided diverticula.  Cecal polyp with status post cold snare polypectomy.  Remainder of colonic mucosa appeared normal. Pathology with tubular adenoma. Repeat in 2017   COLONOSCOPY WITH PROPOFOL  N/A 07/15/2020   Procedure: COLONOSCOPY WITH PROPOFOL ;  Surgeon: Shaaron Lamar HERO, MD;  Location: [facility];  Service: Endoscopy;  Laterality: N/A;  8:15AM TCS via Ostomy   COLOSTOMY N/A 11/12/2018   Procedure: COLOSTOMY;  Surgeon: Kallie Manuelita BROCKS, MD;  Location: [facility];  Service: General;  Laterality: N/A;   COLOSTOMY REVERSAL N/A 10/02/2020   Procedure: COLOSTOMY REVERSAL;  Surgeon: Kallie Manuelita BROCKS, MD;  Location: [facility];  Service: General;  Laterality: N/A;   ESOPHAGOGASTRODUODENOSCOPY   08/10/2002     NUR: Mild changes of reflux esophagitis limited to gastroesophageal junction  No evidence of ring, stricture, or esophageal candidiasis dysphagia/ Gastritis, possibly H. pylori induced   ESOPHAGOGASTRODUODENOSCOPY  12/25/2002   NUR: Erosive antral gastritis.  The degree of gastritis is more significant than her last exam/ Normal examination of the esophagus/ Esophageal dilatation performed by passing 56 and [address] [place]   ESOPHAGOGASTRODUODENOSCOPY  11/13/2010   RMR: Circumferential distal esophageal erosions with soft peptic stricture, consistent with erosive reflux esophagitis or stricture  formation, status post dilation as described above/  Small hiatal hernia/ Tiny antral erosions, otherwise normal stomach D1 and D2   ESOPHAGOGASTRODUODENOSCOPY (EGD) WITH PROPOFOL  N/A 07/15/2020   Procedure: ESOPHAGOGASTRODUODENOSCOPY (EGD) WITH PROPOFOL ;  Surgeon: Shaaron Lamar HERO, MD;  [facility];  Service: Endoscopy;  Laterality: N/A;   FLEXIBLE SIGMOIDOSCOPY N/A 07/15/2020   Procedure: FLEXIBLE SIGMOIDOSCOPY;  Surgeon: Shaaron Lamar HERO, MD;  [facility];  Service: Endoscopy;  Laterality: N/A;   HEMORRHOID SURGERY  09/02/2012   Procedure: HEMORRHOIDECTOMY;  Surgeon: Oneil DELENA Budge, MD;  Location: [facility];  Service: General;  Laterality: N/A;   LAPAROTOMY N/A 11/12/2018   Procedure: EXPLORATORY LAPAROTOMY;  Surgeon: Kallie Manuelita BROCKS, MD;  Location: [facility];  Service: General;  Laterality: N/A;   MALONEY DILATION N/A 07/15/2020   Procedure: AGAPITO HODGKIN;  Surgeon: Shaaron Lamar HERO, MD;  [facility];  Service: Endoscopy;  Laterality: N/A;   PARTIAL COLECTOMY N/A 11/12/2018   Procedure: PARTIAL COLECTOMY;  Surgeon: Kallie Manuelita BROCKS, MD;  Location: [facility];  Service: General;  Laterality: N/A;   POLYPECTOMY  07/15/2020   Procedure: POLYPECTOMY;  Surgeon: Shaaron Lamar HERO, MD;  [facility];  Service: Endoscopy;;   TUBAL LIGATION     Family History   Problem Relation Age of Onset   Diabetes Mother    Diabetes Brother    Colon cancer Brother        Diagnosed > age 62   Social History   Socioeconomic History   Marital status: Widowed    Spouse name: Not on file   Number of children: 3   Years of education: Not on file   Highest education level: Not on file  Occupational History   Occupation: retired    Associate Professor: RETIRED  Tobacco Use   Smoking status: Never   Smokeless tobacco: Never  Vaping Use   Vaping status: Never Used  Substance and Sexual Activity   Alcohol  use: No   Drug use: No   Sexual activity: Not Currently    Comment: declined condoms  Other Topics Concern   Not on file  Social History Narrative   Not on file   Social Drivers of Health   Financial Resource Strain: Low Risk  (12/27/2020)   Received from [facility]   Overall Financial Resource Strain (CARDIA)    Difficulty of Paying Living Expenses: Not very hard  Food Insecurity: No Food Insecurity (12/25/2023)   Hunger Vital Sign    Worried About Running Out of Food in the Last Year: Never true    Ran Out of Food in the Last Year: Never true  Transportation Needs: Patient Unable To Answer (12/25/2023)   PRAPARE - Transportation    Lack of Transportation (Medical): Patient unable to answer    Lack of Transportation (Non-Medical): Patient unable to answer  Physical Activity: Not on file  Stress: Stress Concern Present (12/27/2020)   Received from [facility] - Occupational Stress Questionnaire    Feeling of Stress : To some extent  Social Connections: Patient Unable To Answer (12/26/2023)   Social Connection and Isolation Panel    Frequency of Communication with Friends and Family: Patient unable to answer    Frequency of Social Gatherings with Friends and Family: Patient unable to answer    Attends Religious Services: Patient unable to answer    Active Member of Clubs or Organizations: Patient unable to answer     Attends Banker Meetings: Patient unable to answer    Marital Status: Patient unable to answer  Intimate Partner Violence: Patient Unable To Answer (12/25/2023)   Humiliation, Afraid, Rape, and Kick questionnaire    Fear of Current or Ex-Partner: Patient unable to answer    Emotionally Abused: Patient unable to answer    Physically Abused: Patient unable to answer    Sexually Abused: Patient unable to answer   Patient participation in Review of Systems quite limited due to dementia Constitutional: Patient reports feeling well GU: As per HPI.  OBJECTIVE Vitals:   04/27/24 1124  BP: 102/69  Pulse: 71   There is no height or weight on file to calculate BMI.  Physical Examination Constitutional: No obvious distress; patient is non-toxic appearing  Cardiovascular: No visible lower extremity edema.  Respiratory: The patient does not have audible wheezing/stridor; respirations do not appear labored  Gastrointestinal: Abdomen non-distended Musculoskeletal: Normal ROM of UEs  Skin: No obvious rashes/open sores  Neurologic: CN 2-12 grossly intact Psychiatric: Answered questions appropriately with normal affect  Hematologic/Lymphatic/Immunologic: No obvious bruises or sites of spontaneous bleeding  UA: did not void  ASSESSMENT Recurrent UTI  OAB (overactive bladder) - Plan: mirabegron  ER (MYRBETRIQ ) 50 MG TB24 tablet  Urge incontinence - Plan: mirabegron  ER (MYRBETRIQ ) 50 MG TB24 tablet, BLADDER SCAN AMB NON-IMAGING  Will discontinue Ditropan (Oxybutynin) - she is not a safe candidate for anticholinergic medications due to risk for side effects based on patient's age, comorbidities, and pre-existing dementia. Will restart Myrbetriq  (Mirabegron ) 50 mg daily instead, which she had previously been tolerating well.   She has been off Keflex  (Cephalexin ) 250 mg daily for UTI prophylaxis for 3-4 months; okay to continue without that as long as she continues to do well without  UTIs.  Will plan for follow up in 6 months or sooner if needed.   PLAN  Advised the following: 1. Discontinue Ditropan (Oxybutynin). 2. Take Myrbetriq  (Mirabegron ) 50 mg daily 3. Return in about 6 months (around 10/28/2024) for OAB, Recurrent UTI, with UA & PVR.  Orders Placed This Encounter  Procedures   BLADDER SCAN AMB NON-IMAGING    It has been explained that the patient is to follow regularly with their PCP in addition to all other providers involved in their care and to follow instructions provided by these respective offices. Patient advised to contact urology clinic if any urologic-pertaining questions, concerns, new symptoms or problems arise in the interim period.  Patient Instructions  UTI prevention / management:  UTI symptoms may include:  - Pain / burning / discomfort when urinating - Recent increase in urinary urgency (how quickly you feel like you need to rush to the bathroom) - Recent increase in urinary frequency (how often you are urinating) - Fever - Acute mental status change / confusion - Fatigue / Feeling tired - Weakness - Note: Urine color, clarity, and odor are not considered to be clinically significant indicators of UTI and do not warrant urine testing unless patient is also experiencing UTI symptoms such as those listed above.  Difference between Urinalysis (urine dipstick test) and Urine culture / Why urine culture often needed to determine appropriate diagnosis and treatment of urologic symptoms: > Urinalysis (urine dipstick test): A quick office test used as an indicator to determine whether or not further testing is necessary (such as a urine culture, urine microscopy, etc.) The urinalysis cannot differentiate a true bacterial UTI or give a definitive diagnosis for the findings.  > Urine culture: May be performed based on the findings of a urinalysis to evaluate for UTI. Grows out on a petri dish for 48-72 hours. Provides important information  about: whether or not bacterial growth is present and if so: what the predominant bacteria is which antibiotics will work best against that bacteria That information is important so that we can diagnose and treat patients appropriately as there are other conditions which may mimic UTls which must not be missed (such as cancer, interstitial cystitis, stones, etc.). Assists us  with antibiotic stewardship to minimize patient's risk for developing antibiotic resistance (getting to a point where no antibiotics work anymore).  Options when UTI symptoms occur: 1. Call [facility] [place] and request to speak with triage nurse (phone # [phone], select option 3). In accordance with clinic guidelines the nurse will determine next steps based on patient-reported symptoms, which may include: same-day lab visit to provide urine specimen, recommendation to schedule Urology office visit appointment for further evaluation, recommendation to proceed to ER, etc. 2. Call your Primary Care Provider (PCP) office to request urgent / same-day visit. Be sure to request for urine culture to be ordered and have results faxed to Urology (fax # [phone]).  3. Go to urgent care. Be sure to request for urine culture to be ordered and have results faxed to Urology (fax # [phone]).   For bladder pain/ burning with urination: - Can take over-the-counter Pyridium  (phenazopyridine ; commonly known under the AZO brand) for a few days as needed. Limit use to no more than 3 days consecutively due to risk for methemoglobinemia, liver function issues, and bone health damage with long term use of Pyridium . - Alternative: Prescription urinary analgesics (such as Uribel, Urogesic blue, Urelle, Uro-MP). Often expensive / poorly covered by insurance unfortunately.  Options / recommendations for UTI prevention: - Low dose antibiotic daily for UTI prophylaxis. - Topical vaginal estrogen for  vaginal atrophy (aka  Genitourinary Syndrome of Menopause (GSM)). - Adequate daily fluid intake to flush out the urinary tract. - Go to the bathroom to urinate every 4-6 hours while awake to minimize urinary stasis / bacterial overgrowth in the bladder. - Proanthocyanidin (PAC) supplement 36 mg daily; must be soluble (insoluble form of PAC will be ineffective). Recommended brand: Ellura. This is an over-the-counter supplement (often must be found/ purchased online) supplement derived from cranberries with concentrated active component: Proanthocyanidin (PAC) 36 mg daily. Decreases bacterial adherence to bladder lining.  - D-mannose powder (2 grams daily). This is an over-the-counter supplement which decreases bacterial adherence to bladder lining (it is a sugar that inhibits bacterial adherence to urothelial cells by binding to the pili of enteric bacteria). Take as per manufacturer recommendation. Can be used as an alternative or in addition to the concentrated cranberry supplement.  - Vitamin C supplement to acidify urine to minimize bacterial growth.  - Probiotic to maintain healthy vaginal microbiome to suppress bacteria at urethral opening. Brand recommendations: Verlon (includes probiotic & D-mannose ), Feminine Balance (highest concentration of lactobacillus) or Hyperbiotic Pro 15.  Note for patients with diabetes:  - Be aware that D-mannose contains sugar.  Note for patients with interstitial cystitis (IC):  - Patients with IC should typically avoid cranberry/ PAC supplements and Vitamin C supplements due to their acidity, which may exacerbate IC-related bladder pain. - Symptoms of true bacterial UTI can overlap / mimic symptoms of an IC flare up. Antibiotic use is NOT indicated for IC flare ups. Urine culture needed prior to antibiotic treatment for IC patients. The goal is to minimize your risk for developing antibiotic-resistant bacteria.    Vaginal atrophy I Genitourinary syndrome of menopause (GSM):  What  it is: Changes in the vaginal environment (including the vulva and urethra) including: Thinning of the epithelium (skin/ mucosa surface) Can contribute to urinary urgency and frequency Can contribute to dryness, itching, irritation of the vulvar and vaginal tissue Can contribute to pain with intercourse Can contribute to physical changes of the labia, vulva, and vagina such as: Narrowing of the vaginal opening Decreased vaginal length Loss of labial architecture Labial adhesions Pale color of vulvovaginal tissue Loss of pubic hair Allows bacteria to become adherent Results in increased risk for urinary tract infection (UTI) due to bacterial overgrowth and migration up the urethra into the bladder Change in vaginal pH (acid/ base balance) Allows for alteration / disruption of the normal bacterial flora / microbiome Results in increased risk for urinary tract infection (UTI) due to bacterial overgrowth  Treatment options: Over-the-counter lubricants (see list below). Prescription vaginal estrogen replacement. Options: Topical vaginal estrogen (estradiol) cream: (Estrace, Premarin , compounded) Apply as directed Estring vaginal ring Exchanged every 3 months (either at home or in office by provider) Vagifem vaginal tablet Inserted nightly for 2 weeks then twice a week (long term) lntrarosa vaginal suppository Vaginal DHEA: converts to estrogen in vaginal tissue without systemic effect Inserted nightly (long term) 3. Vaginal laser therapy (Mona Olam touch) Performed in 3 treatments each 6 weeks apart (available in our [place] office). Can feel like a sunburn for 3-4 days after each treatment until new skin heals in. Usually not covered by insurance. Estimated cost is $1500 for all 3 sessions.  FYI regarding prescription vaginal estrogen treatment options: - All topical vaginal estrogen replacement options are equivalent in terms of efficacy. - Topical vaginal estrogen replacement will  take about 3 months to be effective. - OK to have sex with  any of the topical vaginal estrogen replacement options. - Topical vaginal estrogen replacement may sting/burn initially due to severe dryness, which will improve with ongoing treatment. - Studies have demonstrated negligible systemic absorption of low-dose intravaginal estrogen cream therefore the risk for cancer development or recurrence with this medication is minimal.  Genitourinary Syndrome of Menopause: AUA/SUFU/AUGS Guideline (2025) [place].at  Topical vaginal estrogen cream safe to use with breast cancer history [email or web address]  Topical vaginal estrogen cream safe to use with blood clot history [email or web address]   Lubricants and Moisturizers for Treating Genitourinary Syndrome of Menopause and Vulvovaginal Atrophy Treatment Comments I Available Products   Lubricants   Water -based Ingredients: Deionized water , glycerin, propylene glycol; latex safe; rare irritation; dry out with extended sexual activity Astroglide, Good Clean Love, K-Y Jelly, Natural, Organic, Pink, Sliquid, Sylk, Yes    Oil Based Ingredients: avocado, olive, peanut, corn; latex safe; can be used with silicone products; staining; safe (unless peanut allergy); non-irritating Coconut oil, vegetable oil, vitamin E oil  Silicone-Based Ingredients: Silicone polymers; staining; typically nonirritating, long lasting; waterproof; should not be used with silicone dilators, sexual toys, or gynecologic products Astroglide X, Oceanus Ultra Pure, Pink Silicone, Pjur Eros, Replens Silky Smooth, Silicone Premium JO, SKYN, Uberlube, [employer] Based Minimize harm to sperm motility; designed for couples trying to conceive:  Astroglide TTC, Conceive Plus, PreSeed, Yes Baby  Fertility Friendly Minimize harm to sperm motility; designed for couples trying to conceive:  Astroglide, TTC, Conceive Plus, PreSeed, Yes Baby  Vaginal Moisturizers   Vaginal Moisturizers For maintenance use 1 to 3 times weekly; can benefit women with dryness, chafing with AOL, and recurrent vaginal infections irrespective of sexual activity timing Balance Active Menopause Vaginal Moisturizing Lubricant, Canesintima Intimate Moisturizer, Replens, Rephresh, Sylk Natural Intimate Moisturizer, Yes Vaginal Moisturizer  Hybrids Properties of both water  and silicone-based products (combination of a vaginal lubricant and moisturizer); Non-irritating; good option for women with allergies and sensitivities Lubrigyn, Luvena  Suppositories Hyaluronic acid to retain moisture Revaree  Vulvar Soothing Creams/Oils    Medicated Creams Pain and burn relief; Ingredients: 4% Lidocaine , Aloe Vera gel Releveum ([employer])  Non-Medicated Creams For anti-itch and moisture/maintenance; Ingredients: Coconut oil, Avocado oil, Shea Butter, Olive oil, Vitamin E Vajuvenate, Vmagic  Oils for moisture/maintenance: Coconut oil, Vitamin E oil, Emu oil     Electronically signed by:  Lauraine JAYSON Oz, FNP   04/27/24    12:30 PM

## 2024-04-27 NOTE — Progress Notes (Signed)
 Bladder Scan completed today.  Patient cannot void prior to the bladder scan. Bladder scan result: 137  Performed By: Exie DASEN. CMA  Additional notes-

## 2024-08-10 ENCOUNTER — Emergency Department (HOSPITAL_COMMUNITY)
Admission: EM | Admit: 2024-08-10 | Discharge: 2024-08-10 | Disposition: A | Discharge status: Skilled Nursing Facility | Payer: Medicare (Managed Care) | Source: Skilled Nursing Facility | Attending: Emergency Medicine | Admitting: Emergency Medicine

## 2024-08-10 ENCOUNTER — Encounter (HOSPITAL_COMMUNITY): Payer: Self-pay

## 2024-08-10 ENCOUNTER — Emergency Department (HOSPITAL_COMMUNITY): Payer: Medicare (Managed Care)

## 2024-08-10 ENCOUNTER — Other Ambulatory Visit: Payer: Self-pay

## 2024-08-10 DIAGNOSIS — Z79899 Other long term (current) drug therapy: Secondary | ICD-10-CM | POA: Diagnosis not present

## 2024-08-10 DIAGNOSIS — J449 Chronic obstructive pulmonary disease, unspecified: Secondary | ICD-10-CM | POA: Insufficient documentation

## 2024-08-10 DIAGNOSIS — F0390 Unspecified dementia without behavioral disturbance: Secondary | ICD-10-CM | POA: Diagnosis not present

## 2024-08-10 DIAGNOSIS — F209 Schizophrenia, unspecified: Secondary | ICD-10-CM

## 2024-08-10 DIAGNOSIS — Z21 Asymptomatic human immunodeficiency virus [HIV] infection status: Secondary | ICD-10-CM | POA: Diagnosis not present

## 2024-08-10 DIAGNOSIS — R4182 Altered mental status, unspecified: Secondary | ICD-10-CM | POA: Insufficient documentation

## 2024-08-10 DIAGNOSIS — I10 Essential (primary) hypertension: Secondary | ICD-10-CM | POA: Diagnosis not present

## 2024-08-10 LAB — CBC
HCT: 42.9 % (ref 36.0–46.0)
Hemoglobin: 14.3 g/dL (ref 12.0–15.0)
MCH: 31.9 pg (ref 26.0–34.0)
MCHC: 33.3 g/dL (ref 30.0–36.0)
MCV: 95.8 fL (ref 80.0–100.0)
Platelets: 173 K/uL (ref 150–400)
RBC: 4.48 MIL/uL (ref 3.87–5.11)
RDW: 12.6 % (ref 11.5–15.5)
WBC: 7.4 K/uL (ref 4.0–10.5)
nRBC: 0 % (ref 0.0–0.2)

## 2024-08-10 LAB — COMPREHENSIVE METABOLIC PANEL WITH GFR
ALT: 9 U/L (ref 0–44)
AST: 18 U/L (ref 15–41)
Albumin: 4 g/dL (ref 3.5–5.0)
Alkaline Phosphatase: 112 U/L (ref 38–126)
Anion gap: 9 (ref 5–15)
BUN: 11 mg/dL (ref 8–23)
CO2: 28 mmol/L (ref 22–32)
Calcium: 10.4 mg/dL — ABNORMAL HIGH (ref 8.9–10.3)
Chloride: 108 mmol/L (ref 98–111)
Creatinine, Ser: 0.98 mg/dL (ref 0.44–1.00)
GFR, Estimated: 57 mL/min — ABNORMAL LOW (ref >60)
Glucose, Bld: 98 mg/dL (ref 70–99)
Potassium: 4.1 mmol/L (ref 3.5–5.1)
Sodium: 145 mmol/L (ref 135–145)
Total Bilirubin: 0.7 mg/dL (ref 0.0–1.2)
Total Protein: 6.7 g/dL (ref 6.5–8.1)

## 2024-08-10 LAB — URINALYSIS, ROUTINE W REFLEX MICROSCOPIC
Bilirubin Urine: NEGATIVE
Glucose, UA: NEGATIVE mg/dL
Hgb urine dipstick: NEGATIVE
Ketones, ur: NEGATIVE mg/dL
Leukocytes,Ua: NEGATIVE
Nitrite: NEGATIVE
Protein, ur: NEGATIVE mg/dL
Specific Gravity, Urine: 1.013 (ref 1.005–1.030)
pH: 5 (ref 5.0–8.0)

## 2024-08-10 LAB — CBG MONITORING, ED: Glucose-Capillary: 95 mg/dL (ref 70–99)

## 2024-08-10 MED ORDER — LORAZEPAM 2 MG/ML IJ SOLN
1.0000 mg | Freq: Once | INTRAMUSCULAR | Status: AC
  Administered 2024-08-10: 1 mg via INTRAMUSCULAR
  Filled 2024-08-10: qty 1

## 2024-08-10 NOTE — ED Triage Notes (Signed)
 Pt from [facility], NP from facility called to say that pt was being sent over for a uti and some shortness of breath, pt to er with eyes closed, resps even and unlabored, pt minimally responsive,

## 2024-08-10 NOTE — Discharge Instructions (Signed)
 You are seen in the ER today for evaluation.  You are being treated for a UTI with antibiotics at your facility which you should finish.  Mental health may need to evaluate to see if injectable medicines are more appropriate.  Workup here today was overall very reassuring.  Come back for new or worsening symptoms.

## 2024-08-10 NOTE — ED Provider Notes (Signed)
 [place] EMERGENCY DEPARTMENT AT [facility] Provider Note   CSN: [id number] Arrival date & time: 08/10/24  1324     Patient presents with: Altered Mental Status   [name] is a 83 y.o. female.  She has history of COPD, HIV, hypertension, schizophrenia, dementia colostomy from previous large bowel perforation.  She presents from [facility] home for evaluation of decreased mental status today.  History taken from NP Landry Gaskins who has been in care of the patient.  Patient is not responding to questions, and has history of dementia.  Reportedly patient has been refusing her psych meds for the past month.  She is stating that people are poisoning her with medications that she is allergic to (zines-compazine,promethazine) She supposed to take aripiprazole  which was recently increased to 7 mg daily Klonopin  0.5 mg twice daily and venlafaxine .  She also is refusing her HIV meds.  Since she was still refusing medicines and was acting somewhat off her baseline chest x-ray was ordered which was normal, she did BMP 2 days ago which was normal and also had a urine culture.  This grew out ESBL producing E. coli that resistant to everything but independent in ertapenem .  She received ertapenem  yesterday and today.  Family came to visit and did not like how she looked, reported that she was not responsive and wanted her to be sent to the ER.  Per the NP, she was completely alert but stating she was not going to be taking any medications on her exam.    Altered Mental Status      Prior to Admission medications   Medication Sig Start Date End Date Taking? Authorizing Provider  ALLERGY RELIEF 10 MG tablet Take 10 mg by mouth daily. 05/02/21   [provider]  amLODipine  (NORVASC ) 5 MG tablet Take 1 tablet (5 mg total) by mouth daily. 12/28/23 12/27/24  Laurence Locus, DO  ARIPiprazole  (ABILIFY ) 5 MG tablet Take 5 mg by mouth daily. 10/17/21   [provider]   atorvastatin  (LIPITOR) 20 MG tablet Take 20 mg by mouth daily.    [provider]  bictegravir-emtricitabine -tenofovir  AF (BIKTARVY ) 50-200-25 MG TABS tablet Take 1 tablet by mouth daily. Please discontinue TRIUMEQ  and TIVICAY  Patient taking differently: Take 1 tablet by mouth daily. 05/25/22   Fleeta Kathie Jomarie LOISE, MD  clonazePAM  (KLONOPIN ) 0.5 MG tablet Take 1 tablet (0.5 mg total) by mouth 2 (two) times daily as needed for anxiety. 12/28/23   Laurence Locus, DO  docusate sodium  (COLACE) 100 MG capsule Take 1 capsule (100 mg total) by mouth 2 (two) times daily. 11/21/18   Kallie Manuelita BROCKS, MD  donepezil  (ARICEPT ) 5 MG tablet Take 5-10 mg by mouth in the morning and at bedtime. 5mg  in the morning and 10mg  at bedtime 05/05/23   [provider]  furosemide  (LASIX ) 40 MG tablet Take 1 tablet (40 mg total) by mouth daily as needed for fluid or edema. Hold until follow up with PCP 12/28/23   Laurence Locus, DO  ipratropium-albuterol  (DUONEB) 0.5-2.5 (3) MG/3ML SOLN Inhale 3 mLs into the lungs every 6 (six) hours as needed. 12/09/23   [provider]  mirabegron  ER (MYRBETRIQ ) 50 MG TB24 tablet Take 1 tablet (50 mg total) by mouth daily. 04/27/24   Gerldine Lauraine BROCKS, FNP  mirtazapine  (REMERON ) 7.5 MG tablet Take 15 mg by mouth at bedtime. 08/03/23   [provider]  Multiple Vitamin (ONE-A-DAY 55 PLUS PO) Take 1 tablet by  mouth daily.    [provider]  ondansetron  (ZOFRAN ) 4 MG tablet Take 1 tablet (4 mg total) by mouth every 6 (six) hours as needed for nausea. 12/28/23   Laurence Locus, DO  pantoprazole  (PROTONIX ) 20 MG tablet Take 20 mg by mouth daily. 08/31/23   [provider]  polyethylene glycol (MIRALAX ) 17 g packet Take 17 g by mouth daily as needed for mild constipation. Hold for diarrhea 12/28/23   Laurence Locus, DO  polyvinyl alcohol  (LIQUIFILM TEARS) 1.4 % ophthalmic solution Place 1 drop into both eyes daily.    [provider]  thiamine  (VITAMIN B-1) 100  MG tablet Take 100 mg by mouth daily.    [provider]  Venlafaxine  HCl 75 MG TB24 Take 75 mg by mouth daily. 12/02/22   [provider]    Allergies: Bee venom, Promethazine, Tenofovir  disoproxil fumarate [tenofovir  disoproxil fumarate], Compazine  [prochlorperazine edisylate], Haloperidol  lactate, Sulfa  antibiotics, Brompheniramine-acetaminophen , Chlorcyclizine, Chlorpromazine, Codeine, Navane [thiothixene (tiotixene)], Prednisone , Promethazine hcl, Propoxyphene n-acetaminophen , and Morphine     Review of Systems  Updated Vital Signs There were no vitals taken for this visit.  Physical Exam Vitals and nursing note reviewed.  Constitutional:      General: She is not in acute distress.    Appearance: She is well-developed.  HENT:     Head: Normocephalic and atraumatic.     Mouth/Throat:     Mouth: Mucous membranes are moist.  Eyes:     Extraocular Movements: Extraocular movements intact.     Conjunctiva/sclera: Conjunctivae normal.     Pupils: Pupils are equal, round, and reactive to light.  Cardiovascular:     Rate and Rhythm: Normal rate and regular rhythm.     Heart sounds: No murmur heard. Pulmonary:     Effort: Pulmonary effort is normal. No respiratory distress.     Breath sounds: Normal breath sounds.  Abdominal:     Palpations: Abdomen is soft.     Tenderness: There is no abdominal tenderness.  Musculoskeletal:        General: No swelling.     Cervical back: Neck supple.  Skin:    General: Skin is warm and dry.     Capillary Refill: Capillary refill takes less than 2 seconds.  Neurological:     General: No focal deficit present.     Mental Status: She is alert and oriented to person, place, and time.  Psychiatric:        Mood and Affect: Mood normal.     (all labs ordered are listed, but only abnormal results are displayed) Labs Reviewed - No data to display  EKG: None  Radiology: No results found.   Procedures   Medications Ordered  in the ED - No data to display                                  Medical Decision Making This patient presents to the ED for concern of not taking medications, increased somnolence, this involves an extensive number of treatment options, and is a complaint that carries with it a high risk of complications and morbidity.  The differential diagnosis includes UTI, metabolic encephalopathy, schizophrenia, other   Co morbidities that complicate the patient evaluation  Dyspnea, HIV, hypertension, dementia   Additional history obtained:  Additional history obtained from EMR External records from outside source obtained and reviewed including prior notes, labs, imaging    Imaging Studies  ordered:  I ordered imaging studies including head I independently visualized and interpreted imaging which showed atrophy, chronic infarct no acute changes I agree with the radiologist interpretation     Problem List / ED Course / Critical interventions / Medication management  Patient sent in for not taking her meds at her facility, not as alert as normal.  She is on Invanz  for UTI with culture positive for ESBL producing E. coli.  She had reassuring labs and chest x-ray at the facility recently but family was concerned about her mental status and wanted her to be evaluated.  Workup here is reassuring.  UA shows 16 white blood cells and few bacteria so Invanz  appears to be effective.  CT scan of her head showed no acute findings.  Her CBC and CMP were reassuring aside for very mild elevated calcium .  Patient had normal vital signs.  She was given IM Ativan  here to see if this could potentially improve the negative symptoms of her schizophrenia since she has been not taking her Klonopin .  Patient will wake up and is able to tell me her name, states she does not want to be heard, denies pain or any complaints outside of a headache that she states always hurts and always has hurt.  She has no focal  neurologic deficits.  CT of her head is normal, no fever or neck stiffness suggest meningitis.  History was primarily taken from NP Landry Gaskins, and I discussed patient's care and results here in the ED with her emergency contact, Vernell Collet, who was reassured by her workup and is agreeable with her returning to facility. I discussed with her emergency contact that perhaps the doctor managing her psychiatric medicines could consider injectable long-acting medicines since she is not taking p.o. medications due to paranoia.  Return to facility where she has all of follow-up she needs, they are able to continue giving her her Invanz  and continue monitoring her.  Advised on follow-up and return precautions. I have reviewed the patients home medicines and have made adjustments as needed       Amount and/or Complexity of Data Reviewed External Data Reviewed: labs and notes. Labs: ordered. Decision-making details documented in ED Course. Radiology: ordered.  Risk Prescription drug management.        Final diagnoses:  None    ED Discharge Orders     None          Suellen Sherran DELENA DEVONNA 08/10/24 1701    Yolande Lamar BROCKS, MD 08/16/24 [phone]

## 2024-10-27 ENCOUNTER — Telehealth: Payer: Self-pay

## 2024-10-27 NOTE — Telephone Encounter (Signed)
 Care coordinator called and lvm to confirm patient upcoming appt I returned phone call and lvm of patient scheduled appt

## 2024-10-31 ENCOUNTER — Ambulatory Visit: Payer: Medicare (Managed Care) | Admitting: Urology

## 2024-11-08 ENCOUNTER — Ambulatory Visit: Payer: Medicare (Managed Care) | Admitting: Urology

## 2025-02-05 ENCOUNTER — Ambulatory Visit: Payer: Medicare (Managed Care) | Admitting: Urology
